# Patient Record
Sex: Male | Born: 1935
Health system: Southern US, Community
[De-identification: ages and names within clinical notes are randomized; demographics above are authoritative.]

## PROBLEM LIST (undated history)

## (undated) DIAGNOSIS — E785 Hyperlipidemia, unspecified: Secondary | ICD-10-CM

## (undated) DIAGNOSIS — D500 Iron deficiency anemia secondary to blood loss (chronic): Secondary | ICD-10-CM

## (undated) DIAGNOSIS — Z9889 Other specified postprocedural states: Secondary | ICD-10-CM

## (undated) DIAGNOSIS — H7290 Unspecified perforation of tympanic membrane, unspecified ear: Secondary | ICD-10-CM

## (undated) DIAGNOSIS — K922 Gastrointestinal hemorrhage, unspecified: Secondary | ICD-10-CM

## (undated) DIAGNOSIS — C61 Malignant neoplasm of prostate: Secondary | ICD-10-CM

## (undated) DIAGNOSIS — I480 Paroxysmal atrial fibrillation: Secondary | ICD-10-CM

## (undated) DIAGNOSIS — K209 Esophagitis, unspecified without bleeding: Secondary | ICD-10-CM

## (undated) DIAGNOSIS — I350 Nonrheumatic aortic (valve) stenosis: Secondary | ICD-10-CM

## (undated) DIAGNOSIS — I739 Peripheral vascular disease, unspecified: Secondary | ICD-10-CM

## (undated) DIAGNOSIS — G5600 Carpal tunnel syndrome, unspecified upper limb: Secondary | ICD-10-CM

## (undated) DIAGNOSIS — M353 Polymyalgia rheumatica: Secondary | ICD-10-CM

## (undated) DIAGNOSIS — K2210 Ulcer of esophagus without bleeding: Secondary | ICD-10-CM

## (undated) DIAGNOSIS — I2510 Atherosclerotic heart disease of native coronary artery without angina pectoris: Secondary | ICD-10-CM

## (undated) DIAGNOSIS — K219 Gastro-esophageal reflux disease without esophagitis: Secondary | ICD-10-CM

## (undated) DIAGNOSIS — E559 Vitamin D deficiency, unspecified: Secondary | ICD-10-CM

## (undated) DIAGNOSIS — I10 Essential (primary) hypertension: Secondary | ICD-10-CM

## (undated) HISTORY — DX: Essential (primary) hypertension: I10

## (undated) HISTORY — DX: Malignant neoplasm of prostate: C61

## (undated) HISTORY — DX: Gastro-esophageal reflux disease without esophagitis: K21.9

## (undated) HISTORY — DX: Ulcer of esophagus without bleeding: K22.10

## (undated) HISTORY — DX: Peripheral vascular disease, unspecified: I73.9

## (undated) HISTORY — DX: Vitamin D deficiency, unspecified: E55.9

## (undated) HISTORY — DX: Other specified postprocedural states: Z98.890

## (undated) HISTORY — DX: Polymyalgia rheumatica: M35.3

## (undated) HISTORY — DX: Iron deficiency anemia secondary to blood loss (chronic): D50.0

## (undated) HISTORY — PX: PROSTATECTOMY: SHX69

## (undated) HISTORY — DX: Hyperlipidemia, unspecified: E78.5

## (undated) HISTORY — DX: Unspecified perforation of tympanic membrane, unspecified ear: H72.90

## (undated) HISTORY — DX: Esophagitis, unspecified without bleeding: K20.90

## (undated) HISTORY — DX: Carpal tunnel syndrome, unspecified upper limb: G56.00

## (undated) HISTORY — DX: Esophagitis, unspecified: K20.9

---

## 2005-12-08 ENCOUNTER — Ambulatory Visit: Payer: Self-pay | Admitting: Oncology

## 2006-03-03 DIAGNOSIS — Z9889 Other specified postprocedural states: Secondary | ICD-10-CM

## 2006-03-03 HISTORY — DX: Other specified postprocedural states: Z98.890

## 2007-12-28 ENCOUNTER — Ambulatory Visit (HOSPITAL_BASED_OUTPATIENT_CLINIC_OR_DEPARTMENT_OTHER): Admission: RE | Admit: 2007-12-28 | Discharge: 2007-12-28 | Payer: Self-pay | Admitting: Orthopedic Surgery

## 2008-03-10 ENCOUNTER — Ambulatory Visit: Admission: RE | Admit: 2008-03-10 | Discharge: 2008-06-08 | Payer: Self-pay | Admitting: Radiation Oncology

## 2008-03-24 ENCOUNTER — Ambulatory Visit (HOSPITAL_BASED_OUTPATIENT_CLINIC_OR_DEPARTMENT_OTHER): Admission: RE | Admit: 2008-03-24 | Discharge: 2008-03-24 | Payer: Self-pay | Admitting: Orthopedic Surgery

## 2009-03-03 HISTORY — PX: CARDIAC CATHETERIZATION: SHX172

## 2009-04-30 ENCOUNTER — Inpatient Hospital Stay (HOSPITAL_COMMUNITY): Admission: RE | Admit: 2009-04-30 | Discharge: 2009-05-01 | Payer: Self-pay | Admitting: Interventional Cardiology

## 2009-05-17 ENCOUNTER — Inpatient Hospital Stay (HOSPITAL_COMMUNITY): Admission: EM | Admit: 2009-05-17 | Discharge: 2009-05-21 | Payer: Self-pay | Admitting: Emergency Medicine

## 2010-05-22 LAB — GLUCOSE, CAPILLARY
Glucose-Capillary: 115 mg/dL — ABNORMAL HIGH (ref 70–99)
Glucose-Capillary: 87 mg/dL (ref 70–99)

## 2010-05-26 LAB — BASIC METABOLIC PANEL
BUN: 10 mg/dL (ref 6–23)
BUN: 11 mg/dL (ref 6–23)
BUN: 4 mg/dL — ABNORMAL LOW (ref 6–23)
CO2: 25 mEq/L (ref 19–32)
CO2: 26 mEq/L (ref 19–32)
CO2: 27 mEq/L (ref 19–32)
Calcium: 7.9 mg/dL — ABNORMAL LOW (ref 8.4–10.5)
Calcium: 8.5 mg/dL (ref 8.4–10.5)
Calcium: 8.8 mg/dL (ref 8.4–10.5)
Chloride: 104 mEq/L (ref 96–112)
Chloride: 105 mEq/L (ref 96–112)
Chloride: 108 mEq/L (ref 96–112)
Creatinine, Ser: 0.7 mg/dL (ref 0.4–1.5)
Creatinine, Ser: 0.75 mg/dL (ref 0.4–1.5)
Creatinine, Ser: 0.76 mg/dL (ref 0.4–1.5)
GFR calc Af Amer: >60 mL/min (ref >60)
GFR calc Af Amer: >60 mL/min (ref >60)
GFR calc Af Amer: >60 mL/min (ref >60)
GFR calc non Af Amer: >60 mL/min (ref >60)
GFR calc non Af Amer: >60 mL/min (ref >60)
GFR calc non Af Amer: >60 mL/min (ref >60)
Glucose, Bld: 100 mg/dL — ABNORMAL HIGH (ref 70–99)
Glucose, Bld: 102 mg/dL — ABNORMAL HIGH (ref 70–99)
Glucose, Bld: 96 mg/dL (ref 70–99)
Potassium: 3.3 mEq/L — ABNORMAL LOW (ref 3.5–5.1)
Potassium: 3.5 mEq/L (ref 3.5–5.1)
Potassium: 3.8 mEq/L (ref 3.5–5.1)
Sodium: 136 mEq/L (ref 135–145)
Sodium: 137 mEq/L (ref 135–145)
Sodium: 139 mEq/L (ref 135–145)

## 2010-05-26 LAB — COMPREHENSIVE METABOLIC PANEL
ALT: 16 U/L (ref 0–53)
ALT: 16 U/L (ref 0–53)
AST: 17 U/L (ref 0–37)
AST: 19 U/L (ref 0–37)
Albumin: 3.2 g/dL — ABNORMAL LOW (ref 3.5–5.2)
Albumin: 3.7 g/dL (ref 3.5–5.2)
Alkaline Phosphatase: 25 U/L — ABNORMAL LOW (ref 39–117)
Alkaline Phosphatase: 29 U/L — ABNORMAL LOW (ref 39–117)
BUN: 20 mg/dL (ref 6–23)
BUN: 37 mg/dL — ABNORMAL HIGH (ref 6–23)
CO2: 24 mEq/L (ref 19–32)
CO2: 25 mEq/L (ref 19–32)
Calcium: 8.5 mg/dL (ref 8.4–10.5)
Calcium: 8.8 mg/dL (ref 8.4–10.5)
Chloride: 106 mEq/L (ref 96–112)
Chloride: 111 mEq/L (ref 96–112)
Creatinine, Ser: 0.72 mg/dL (ref 0.4–1.5)
Creatinine, Ser: 0.83 mg/dL (ref 0.4–1.5)
GFR calc Af Amer: >60 mL/min (ref >60)
GFR calc Af Amer: >60 mL/min (ref >60)
GFR calc non Af Amer: >60 mL/min (ref >60)
GFR calc non Af Amer: >60 mL/min (ref >60)
Glucose, Bld: 111 mg/dL — ABNORMAL HIGH (ref 70–99)
Glucose, Bld: 136 mg/dL — ABNORMAL HIGH (ref 70–99)
Potassium: 3.7 mEq/L (ref 3.5–5.1)
Potassium: 4 mEq/L (ref 3.5–5.1)
Sodium: 138 mEq/L (ref 135–145)
Sodium: 140 mEq/L (ref 135–145)
Total Bilirubin: 0.6 mg/dL (ref 0.3–1.2)
Total Bilirubin: 0.7 mg/dL (ref 0.3–1.2)
Total Protein: 5.9 g/dL — ABNORMAL LOW (ref 6.0–8.3)
Total Protein: 6.5 g/dL (ref 6.0–8.3)

## 2010-05-26 LAB — DIFFERENTIAL
Basophils Absolute: 0 10*3/uL (ref 0.0–0.1)
Basophils Absolute: 0 10*3/uL (ref 0.0–0.1)
Basophils Relative: 0 % (ref 0–1)
Basophils Relative: 0 % (ref 0–1)
Eosinophils Absolute: 0 10*3/uL (ref 0.0–0.7)
Eosinophils Absolute: 0.1 10*3/uL (ref 0.0–0.7)
Eosinophils Relative: 0 % (ref 0–5)
Eosinophils Relative: 1 % (ref 0–5)
Lymphocytes Relative: 11 % — ABNORMAL LOW (ref 12–46)
Lymphocytes Relative: 9 % — ABNORMAL LOW (ref 12–46)
Lymphs Abs: 0.4 10*3/uL — ABNORMAL LOW (ref 0.7–4.0)
Lymphs Abs: 0.9 10*3/uL (ref 0.7–4.0)
Monocytes Absolute: 0.1 10*3/uL (ref 0.1–1.0)
Monocytes Absolute: 0.6 10*3/uL (ref 0.1–1.0)
Monocytes Relative: 3 % (ref 3–12)
Monocytes Relative: 7 % (ref 3–12)
Neutro Abs: 4.4 10*3/uL (ref 1.7–7.7)
Neutro Abs: 7.1 10*3/uL (ref 1.7–7.7)
Neutrophils Relative %: 81 % — ABNORMAL HIGH (ref 43–77)
Neutrophils Relative %: 88 % — ABNORMAL HIGH (ref 43–77)

## 2010-05-26 LAB — BASIC METABOLIC PANEL WITH GFR
BUN: 6 mg/dL (ref 6–23)
CO2: 26 meq/L (ref 19–32)
Calcium: 8.4 mg/dL (ref 8.4–10.5)
Chloride: 109 meq/L (ref 96–112)
Creatinine, Ser: 0.65 mg/dL (ref 0.4–1.5)
GFR calc non Af Amer: >60 mL/min
Glucose, Bld: 95 mg/dL (ref 70–99)
Potassium: 3.4 meq/L — ABNORMAL LOW (ref 3.5–5.1)
Sodium: 143 meq/L (ref 135–145)

## 2010-05-26 LAB — TYPE AND SCREEN
ABO/RH(D): O NEG
Antibody Screen: NEGATIVE

## 2010-05-26 LAB — MRSA PCR SCREENING: MRSA by PCR: NEGATIVE

## 2010-05-26 LAB — CBC
HCT: 23 % — ABNORMAL LOW (ref 39.0–52.0)
HCT: 23.5 % — ABNORMAL LOW (ref 39.0–52.0)
HCT: 24 % — ABNORMAL LOW (ref 39.0–52.0)
HCT: 25.8 % — ABNORMAL LOW (ref 39.0–52.0)
HCT: 25.8 % — ABNORMAL LOW (ref 39.0–52.0)
HCT: 32.8 % — ABNORMAL LOW (ref 39.0–52.0)
HCT: 35.3 % — ABNORMAL LOW (ref 39.0–52.0)
Hemoglobin: 11.5 g/dL — ABNORMAL LOW (ref 13.0–17.0)
Hemoglobin: 12.5 g/dL — ABNORMAL LOW (ref 13.0–17.0)
Hemoglobin: 8.1 g/dL — ABNORMAL LOW (ref 13.0–17.0)
Hemoglobin: 8.2 g/dL — ABNORMAL LOW (ref 13.0–17.0)
Hemoglobin: 8.3 g/dL — ABNORMAL LOW (ref 13.0–17.0)
Hemoglobin: 8.9 g/dL — ABNORMAL LOW (ref 13.0–17.0)
Hemoglobin: 9 g/dL — ABNORMAL LOW (ref 13.0–17.0)
MCHC: 34.3 g/dL (ref 30.0–36.0)
MCHC: 34.6 g/dL (ref 30.0–36.0)
MCHC: 34.9 g/dL (ref 30.0–36.0)
MCHC: 35.1 g/dL (ref 30.0–36.0)
MCHC: 35.1 g/dL (ref 30.0–36.0)
MCHC: 35.1 g/dL (ref 30.0–36.0)
MCHC: 35.3 g/dL (ref 30.0–36.0)
MCV: 100 fL (ref 78.0–100.0)
MCV: 98.3 fL (ref 78.0–100.0)
MCV: 98.7 fL (ref 78.0–100.0)
MCV: 98.9 fL (ref 78.0–100.0)
MCV: 99.2 fL (ref 78.0–100.0)
MCV: 99.5 fL (ref 78.0–100.0)
MCV: 99.6 fL (ref 78.0–100.0)
Platelets: 130 10*3/uL — ABNORMAL LOW (ref 150–400)
Platelets: 136 10*3/uL — ABNORMAL LOW (ref 150–400)
Platelets: 136 K/uL — ABNORMAL LOW (ref 150–400)
Platelets: 151 10*3/uL (ref 150–400)
Platelets: 151 K/uL (ref 150–400)
Platelets: 154 10*3/uL (ref 150–400)
Platelets: 202 10*3/uL (ref 150–400)
RBC: 2.32 MIL/uL — ABNORMAL LOW (ref 4.22–5.81)
RBC: 2.36 MIL/uL — ABNORMAL LOW (ref 4.22–5.81)
RBC: 2.41 MIL/uL — ABNORMAL LOW (ref 4.22–5.81)
RBC: 2.58 MIL/uL — ABNORMAL LOW (ref 4.22–5.81)
RBC: 2.61 MIL/uL — ABNORMAL LOW (ref 4.22–5.81)
RBC: 3.31 MIL/uL — ABNORMAL LOW (ref 4.22–5.81)
RBC: 3.59 MIL/uL — ABNORMAL LOW (ref 4.22–5.81)
RDW: 13.5 % (ref 11.5–15.5)
RDW: 13.5 % (ref 11.5–15.5)
RDW: 13.6 % (ref 11.5–15.5)
RDW: 13.7 % (ref 11.5–15.5)
RDW: 13.7 % (ref 11.5–15.5)
RDW: 13.9 % (ref 11.5–15.5)
RDW: 13.9 % (ref 11.5–15.5)
WBC: 4 K/uL (ref 4.0–10.5)
WBC: 4.1 10*3/uL (ref 4.0–10.5)
WBC: 4.9 10*3/uL (ref 4.0–10.5)
WBC: 5 10*3/uL (ref 4.0–10.5)
WBC: 5.3 K/uL (ref 4.0–10.5)
WBC: 5.5 10*3/uL (ref 4.0–10.5)
WBC: 8.7 10*3/uL (ref 4.0–10.5)

## 2010-05-26 LAB — HEMOGLOBIN AND HEMATOCRIT, BLOOD
HCT: 23.4 % — ABNORMAL LOW (ref 39.0–52.0)
HCT: 25.1 % — ABNORMAL LOW (ref 39.0–52.0)
HCT: 26.5 % — ABNORMAL LOW (ref 39.0–52.0)
HCT: 27 % — ABNORMAL LOW (ref 39.0–52.0)
HCT: 27.3 % — ABNORMAL LOW (ref 39.0–52.0)
HCT: 27.9 % — ABNORMAL LOW (ref 39.0–52.0)
Hemoglobin: 8.1 g/dL — ABNORMAL LOW (ref 13.0–17.0)
Hemoglobin: 8.7 g/dL — ABNORMAL LOW (ref 13.0–17.0)
Hemoglobin: 9.1 g/dL — ABNORMAL LOW (ref 13.0–17.0)
Hemoglobin: 9.4 g/dL — ABNORMAL LOW (ref 13.0–17.0)
Hemoglobin: 9.5 g/dL — ABNORMAL LOW (ref 13.0–17.0)
Hemoglobin: 9.7 g/dL — ABNORMAL LOW (ref 13.0–17.0)

## 2010-05-26 LAB — GLUCOSE, CAPILLARY: Glucose-Capillary: 136 mg/dL — ABNORMAL HIGH (ref 70–99)

## 2010-05-26 LAB — PROTIME-INR
INR: 1.11 (ref 0.00–1.49)
Prothrombin Time: 14.2 seconds (ref 11.6–15.2)

## 2010-05-26 LAB — HEMOCCULT GUIAC POC 1CARD (OFFICE): Fecal Occult Bld: POSITIVE

## 2010-05-26 LAB — H. PYLORI ANTIBODY, IGG: H Pylori IgG: <0.4 {ISR}

## 2010-05-26 LAB — ABO/RH: ABO/RH(D): O NEG

## 2010-05-26 LAB — APTT: aPTT: 33 seconds (ref 24–37)

## 2010-06-17 LAB — POCT I-STAT, CHEM 8
BUN: 21 mg/dL (ref 6–23)
Calcium, Ion: 1.12 mmol/L (ref 1.12–1.32)
Chloride: 104 mEq/L (ref 96–112)
Creatinine, Ser: 0.7 mg/dL (ref 0.4–1.5)
Glucose, Bld: 96 mg/dL (ref 70–99)
HCT: 44 % (ref 39.0–52.0)
Hemoglobin: 15 g/dL (ref 13.0–17.0)
Potassium: 3.9 mEq/L (ref 3.5–5.1)
Sodium: 138 mEq/L (ref 135–145)
TCO2: 27 mmol/L (ref 0–100)

## 2010-07-16 NOTE — Op Note (Signed)
NAME:  Robert [name], Robert [name]          ACCOUNT NO.:  [id number]   MEDICAL RECORD NO.:  [id number]          PATIENT TYPE:  AMB   LOCATION:  DSC                          FACILITY:  [facility]   PHYSICIAN:  Katy Fitch. Sypher, M.D. DATE OF BIRTH:  29-Mar-1927   DATE OF PROCEDURE:  03/24/2008  DATE OF DISCHARGE:                               OPERATIVE REPORT   PREOPERATIVE DIAGNOSIS:  Entrapment neuropathy, left median nerve at  carpal tunnel.   POSTOPERATIVE DIAGNOSIS:  Entrapment neuropathy, left median nerve at  carpal tunnel.   OPERATIONS:  Release of left transcarpal ligament.   OPERATIONS:  Katy Fitch. Sypher, MD   ASSISTANT:  Marveen Reeks Dasnoit, PA-C   ANESTHESIA:  General by LMA.   SUPERVISING ANESTHESIOLOGIST:  Sheldon Silvan, MD   INDICATIONS:  Robert [name] is a 77 year old gentleman referred  through the courtesy of Dr. Johnella Moloney for evaluation and management  of a numb and painful left hand.  He has background history of  polymyalgia rheumatica.  This has responded to steroid therapy under the  supervision of Dr. Kevan Ny.  He is status post successful right carpal  tunnel release surgery and now presents for similar surgery on the  right.   After informed consent, he is brought to the operating room at this  time.   PROCEDURE:  [name] was brought to the operating room and  placed in the supine position upon the operating table.   Following induction of general anesthesia by LMA technique, the left arm  was prepped with Betadine soap and solution, sterilely draped.  A  pneumatic tourniquet was applied to the proximal left brachium.   Following exsanguination of left arm with an Esmarch bandage, arterial  tourniquet was inflated to 230 mmHg.  The procedure commenced with a  short incision in the line of the ring finger in the palm.  Subcutaneous  tissues were carefully divided revealing the palmar fascia.  This split  longitudinally to the common sensory branch  of the median nerve.  These  were followed back to the transcarpal ligament, which was gently  isolated from median nerve.  Ligament was then released along its ulnar  border extending into the distal forearm.  This widely opened the carpal  canal.  No mass or other predicaments were noted.  Bleeding points along  the ligament were not problematic.  The wound was then repaired with  dermal 3-0 Prolene suture.  A compressive dressing was applied with  Xeroflo sterile gauze and a volar plaster splint maintaining the wrist  in 5 degrees of dorsiflexion.   There were no apparent complications.    Note, for aftercare, [name] is provided a prescription for  Vicodin 5 mg 1 p.o. q.4-6 h. p.r.n. pain, 20 tablets without refill.  He  will return to see me for followup in the office in 1 week.      Katy Fitch Sypher, M.D.  Electronically Signed     RVS/MEDQ  D:  03/24/2008  T:  03/24/2008  Job:  [id number]   cc:   Candyce Churn, M.D.

## 2010-07-16 NOTE — Op Note (Signed)
NAME:  Robert [name], Robert [name]          ACCOUNT NO.:  [id number]   MEDICAL RECORD NO.:  [id number]          PATIENT TYPE:  AMB   LOCATION:  DSC                          FACILITY:  [facility]   PHYSICIAN:  Katy Fitch. Sypher, M.D. DATE OF BIRTH:  Dec 23, 1927   DATE OF PROCEDURE:  12/28/2007  DATE OF DISCHARGE:                               OPERATIVE REPORT   PREOPERATIVE DIAGNOSES:  1. Two-week history of profound right wrist swelling, both dorsal and      volar with acute on chronic severe carpal tunnel syndrome and      dystrophic-type pain response.  2. Rule out atypical mycobacteria infection of tenosynovium of flexor      tendons, rule out chronic atypical infection, rule out gout or      pseudogout.   POSTOPERATIVE DIAGNOSES:  1. 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  2. Severe chronic carpal tunnel syndrome with significant compression      of median nerve and unrecordable median motor latencies across the      right wrist.  3. Chronic synovitis, right wrist with a sero-purulent effusion that      was drained and a partial synovectomy performed for aerobic      cultures, anaerobic cultures, AFB, and fungus cultures with drains      left in the dorsal radiocarpal articulation.   OPERATING SURGEON:  Katy Fitch. Sypher, MD   ASSISTANT:  Marveen Reeks Dasnoit, PA-C   ANESTHESIA:  General by LMA.   SUPERVISING ANESTHESIOLOGIST:  Burna Forts, MD   INDICATIONS:  Robert [name] is a 77 year old gentleman referred  through the courtesy of Robert [name] of [facility].  Beginning in the first week of October, Robert [name] had experience  increasing pain in the right hand.  He had a history of chronic episodic  numbness of his median innervated fingers on the right, which he had  tolerated for more than 18 months.  He had symptoms when he drove, read,  or played his fiddle.  He  subsequently presented for evaluation by Dr.  Kevan [name] on December 16, 2007 and was noted to have signs of chronic  synovitis, probable chronic carpal tunnel syndrome, and triggering of  some fingers.  He had been on a hydrochlorothiazide containing  antihypertensive medication.  Robert [name] sent him for screening lab  studies including a uric acid that was noted to be 3.9 and other routine  screening that was essentially nondiagnostic.  Robert [name] tried a steroid  injection.   Robert [name] returned on December 27, 2007 with increased pain,  increased numbness, obvious wrist swelling, and was provided Toradol as  a parenteral analgesic.  Robert [name] contacted me for an acute upper  extremity orthopedic consult.  Robert [name] had tried indomethacin 50 mg  p.o. b.i.d. on December 17, 2007 in an effort to control gout or  pseudogout without success.   Robert [name] was seen in consult in our office and was noted to have a  very warm wrist on palpation, both dorsal and on the volar  aspect of the  wrist and ulnar bursa.  He had 4+ swelling of his ulnar bursa and  profound carpal tunnel syndrome.  He had less than 50% normal wrist  motion due to pain.  His pulse and capillary refill were well-preserved.  He had intact sensibility in the radial and ulnar distribution.  Robert [name] proceeded with electrodiagnostic studies which confirmed  profound right carpal tunnel syndrome and a mild-to-moderate left carpal  tunnel syndrome.   Due to the fact that Robert [name] had a crescendo of pain, obvious  synovitis, no sign of gout with respect to serum uric acid, and an  elevated sed rate and white count, we recommended proceeding with  synovectomy for culture and decompression of the median nerve with the  carpal tunnel as well as synovectomy of the wrist joint with culture of  the radiocarpal and midcarpal joints.  After informed consent, he is  brought to the operating room at this time.   PROCEDURE IN  DETAIL:  Robert [name] is brought to the operating  room and placed in supine position upon the operating table.  Following  an anesthesia consult with Robert [name], general anesthesia by LMA was  recommended and accepted.   Robert [name] is brought to room 6, placed in supine position upon the  operating table, and under Robert [name] direct supervision general  anesthesia by LMA technique induced.   The right arm was prepped with Betadine soap and solution and sterilely  draped.  A pneumatic tourniquet was applied to the proximal right  brachium.  No IV antibiotics were provided.  The arm was elevated for 1  minute and the arterial tourniquet inflated to 250 mmHg.  The procedure  commenced with an extended carpal tunnel incision paralleling the thenar  crease.  Subcutaneous tissues were carefully divided revealing a very  edematous palmar fascia.  This was carefully split in line of its fibers  revealing the ulnar bursa.  The common sensory branch of the median  nerve and the transverse carpal ligament.  The median nerve was  identified in a very superficial and ulnar position due to marked  swelling of the tenosynovium.  With great care, the carpal canal was  sounded with a Penfield 4 elevator and a scalpel and tenotomy scissors  were used to release the ulnar aspect of the transverse carpal ligament  directly off the hook of the hamate, taking care to avoid any atypical  motor branches of the median nerve.  The tenosynovium of the flexor  tendons and the ulnar bursa was gray, hyperemic, and quite wet with thin  synovial fluid.  A generous portion of this swollen tenosynovium was  harvested with scissors and rongeur dissection for sampling for aerobic  and anaerobic growth, AFB, and fungal culture followed by sampling in  absolute alcohol for crystal examination.  I could not grossly identify  any gouty or pseudogout-type crystals in the synovium.  The floor of the  wrist was  palpated and found to be swollen.  Therefore, we proceeded to  release the volar forearm fascia to prevent secondary compression of the  median nerve in the distal forearm and subsequently irrigated the carpal  canal followed by hemostasis.  The palmar wound was closed with an  intradermal 3-0 Prolene loosely.  Attention was then directed to the  dorsal aspect of the wrist.  A 22-gauge needle was placed through the  arthroscopic 3-4 dorsal portal and approximately 1/2 mL of slightly  turbid yellowish  fluid was aspirated.  This had a less than normal  string sign, suggesting an inflammatory effusion.  A formal dorsal  arthrotomy was then accomplished with a transverse 3 cm incision.  The  extensor tendons were retracted distal to the extensor retinaculum and  an arthrotomy was created directly over the scapholunate ligament and  radiocarpal space.  There were loculations within the wrist joint.  These were broken off with recovery of inflammatory synovial fluid.  There was no sign of frank pus.  Generous samples of synovium,  anaerobic, and aerobic culture swabs were obtained.  The synovium will  be sent for AFB and fungal culture.   After thorough lavage of the wrist, a vessel loop drain was placed into  the ulnar aspect of the radiocarpal articulation.  The midcarpal joint  was gently palpated with a fine hemostat and found also to have an  effusion.   The dorsal wound was loosely closed with an intradermal 3-0 Prolene  suture that was not tensioned.  Steri-Strips were used to secure the  drain and the intradermal 3-0 Prolene suture.  A voluminous hand  dressing was applied with a volar plaster splint.   Presumptive diagnosis is chronic atypical synovitis.  This could be  infectious or due to a crystal arthropathy.  We will have to wait until  we have adequate results from the lab.   After the cultures were obtained, Mr. Beagle was given 1 gram of  Ancef as an IV prophylactic  antibiotic.  He will be discharged on  colchicine, Motrin, and Percocet.  We will also have him continue with a  prescription of Keflex that was provided at his outpatient medical  clinic.      Katy Fitch Sypher, M.D.  Electronically Signed     RVS/MEDQ  D:  12/28/2007  T:  12/29/2007  Job:  [id number]   cc:   Candyce Churn, M.D.

## 2010-12-03 LAB — FUNGUS CULTURE W SMEAR: Fungal Smear: NONE SEEN

## 2010-12-03 LAB — TISSUE CULTURE
Culture: NO GROWTH
Gram Stain: NONE SEEN

## 2010-12-03 LAB — ANAEROBIC CULTURE: Gram Stain: NONE SEEN

## 2010-12-03 LAB — POCT HEMOGLOBIN-HEMACUE: Hemoglobin: 13.5 g/dL (ref 13.0–17.0)

## 2010-12-03 LAB — BASIC METABOLIC PANEL
BUN: 21 mg/dL (ref 6–23)
CO2: 25 mEq/L (ref 19–32)
Calcium: 9.1 mg/dL (ref 8.4–10.5)
Chloride: 101 mEq/L (ref 96–112)
Creatinine, Ser: 1.01 mg/dL (ref 0.4–1.5)
GFR calc Af Amer: >60 mL/min (ref >60)
GFR calc non Af Amer: >60 mL/min (ref >60)
Glucose, Bld: 113 mg/dL — ABNORMAL HIGH (ref 70–99)
Potassium: 4.1 mEq/L (ref 3.5–5.1)
Sodium: 135 mEq/L (ref 135–145)

## 2010-12-03 LAB — WOUND CULTURE
Culture: NO GROWTH
Culture: NO GROWTH
Gram Stain: NONE SEEN

## 2010-12-03 LAB — AFB CULTURE WITH SMEAR (NOT AT ARMC): Acid Fast Smear: NONE SEEN

## 2010-12-03 LAB — SYNOVIAL FLUID, CRYSTAL: Crystals, Fluid: NONE SEEN

## 2011-05-02 DIAGNOSIS — L2089 Other atopic dermatitis: Secondary | ICD-10-CM | POA: Diagnosis not present

## 2011-05-02 DIAGNOSIS — H60399 Other infective otitis externa, unspecified ear: Secondary | ICD-10-CM | POA: Diagnosis not present

## 2011-06-23 DIAGNOSIS — L259 Unspecified contact dermatitis, unspecified cause: Secondary | ICD-10-CM | POA: Diagnosis not present

## 2011-06-23 DIAGNOSIS — Z85828 Personal history of other malignant neoplasm of skin: Secondary | ICD-10-CM | POA: Diagnosis not present

## 2011-06-23 DIAGNOSIS — C44519 Basal cell carcinoma of skin of other part of trunk: Secondary | ICD-10-CM | POA: Diagnosis not present

## 2011-06-23 DIAGNOSIS — L570 Actinic keratosis: Secondary | ICD-10-CM | POA: Diagnosis not present

## 2011-06-23 DIAGNOSIS — D2370 Other benign neoplasm of skin of unspecified lower limb, including hip: Secondary | ICD-10-CM | POA: Diagnosis not present

## 2011-06-23 DIAGNOSIS — D485 Neoplasm of uncertain behavior of skin: Secondary | ICD-10-CM | POA: Diagnosis not present

## 2011-06-23 DIAGNOSIS — C44621 Squamous cell carcinoma of skin of unspecified upper limb, including shoulder: Secondary | ICD-10-CM | POA: Diagnosis not present

## 2011-08-21 DIAGNOSIS — N3000 Acute cystitis without hematuria: Secondary | ICD-10-CM | POA: Diagnosis not present

## 2011-08-21 DIAGNOSIS — N390 Urinary tract infection, site not specified: Secondary | ICD-10-CM | POA: Diagnosis not present

## 2011-10-17 DIAGNOSIS — I10 Essential (primary) hypertension: Secondary | ICD-10-CM | POA: Diagnosis not present

## 2011-10-17 DIAGNOSIS — E785 Hyperlipidemia, unspecified: Secondary | ICD-10-CM | POA: Diagnosis not present

## 2011-10-17 DIAGNOSIS — K2100 Gastro-esophageal reflux disease with esophagitis, without bleeding: Secondary | ICD-10-CM | POA: Diagnosis not present

## 2011-10-17 DIAGNOSIS — I2510 Atherosclerotic heart disease of native coronary artery without angina pectoris: Secondary | ICD-10-CM | POA: Diagnosis not present

## 2011-10-20 DIAGNOSIS — M79609 Pain in unspecified limb: Secondary | ICD-10-CM | POA: Diagnosis not present

## 2011-10-20 DIAGNOSIS — M353 Polymyalgia rheumatica: Secondary | ICD-10-CM | POA: Diagnosis not present

## 2011-10-20 DIAGNOSIS — K2100 Gastro-esophageal reflux disease with esophagitis, without bleeding: Secondary | ICD-10-CM | POA: Diagnosis not present

## 2011-10-20 DIAGNOSIS — I10 Essential (primary) hypertension: Secondary | ICD-10-CM | POA: Diagnosis not present

## 2011-10-20 DIAGNOSIS — J069 Acute upper respiratory infection, unspecified: Secondary | ICD-10-CM | POA: Diagnosis not present

## 2011-10-20 DIAGNOSIS — E559 Vitamin D deficiency, unspecified: Secondary | ICD-10-CM | POA: Diagnosis not present

## 2011-10-24 DIAGNOSIS — J209 Acute bronchitis, unspecified: Secondary | ICD-10-CM | POA: Diagnosis not present

## 2011-11-20 DIAGNOSIS — K2100 Gastro-esophageal reflux disease with esophagitis, without bleeding: Secondary | ICD-10-CM | POA: Diagnosis not present

## 2011-11-20 DIAGNOSIS — M79609 Pain in unspecified limb: Secondary | ICD-10-CM | POA: Diagnosis not present

## 2011-11-20 DIAGNOSIS — M353 Polymyalgia rheumatica: Secondary | ICD-10-CM | POA: Diagnosis not present

## 2011-11-20 DIAGNOSIS — I10 Essential (primary) hypertension: Secondary | ICD-10-CM | POA: Diagnosis not present

## 2011-11-20 DIAGNOSIS — R490 Dysphonia: Secondary | ICD-10-CM | POA: Diagnosis not present

## 2011-11-24 DIAGNOSIS — H7290 Unspecified perforation of tympanic membrane, unspecified ear: Secondary | ICD-10-CM | POA: Diagnosis not present

## 2011-11-24 DIAGNOSIS — H6980 Other specified disorders of Eustachian tube, unspecified ear: Secondary | ICD-10-CM | POA: Diagnosis not present

## 2011-11-24 DIAGNOSIS — J040 Acute laryngitis: Secondary | ICD-10-CM | POA: Diagnosis not present

## 2011-12-16 DIAGNOSIS — H524 Presbyopia: Secondary | ICD-10-CM | POA: Diagnosis not present

## 2011-12-16 DIAGNOSIS — Z961 Presence of intraocular lens: Secondary | ICD-10-CM | POA: Diagnosis not present

## 2012-02-05 DIAGNOSIS — H9210 Otorrhea, unspecified ear: Secondary | ICD-10-CM | POA: Diagnosis not present

## 2012-02-05 DIAGNOSIS — H7290 Unspecified perforation of tympanic membrane, unspecified ear: Secondary | ICD-10-CM | POA: Diagnosis not present

## 2012-02-10 DIAGNOSIS — E559 Vitamin D deficiency, unspecified: Secondary | ICD-10-CM | POA: Diagnosis not present

## 2012-02-10 DIAGNOSIS — L2089 Other atopic dermatitis: Secondary | ICD-10-CM | POA: Diagnosis not present

## 2012-02-10 DIAGNOSIS — Z23 Encounter for immunization: Secondary | ICD-10-CM | POA: Diagnosis not present

## 2012-02-10 DIAGNOSIS — Z1331 Encounter for screening for depression: Secondary | ICD-10-CM | POA: Diagnosis not present

## 2012-02-10 DIAGNOSIS — Z0000 Encounter for general adult medical examination without abnormal findings: Secondary | ICD-10-CM | POA: Diagnosis not present

## 2012-02-10 DIAGNOSIS — C61 Malignant neoplasm of prostate: Secondary | ICD-10-CM | POA: Diagnosis not present

## 2012-02-10 DIAGNOSIS — I10 Essential (primary) hypertension: Secondary | ICD-10-CM | POA: Diagnosis not present

## 2012-02-10 DIAGNOSIS — Z79899 Other long term (current) drug therapy: Secondary | ICD-10-CM | POA: Diagnosis not present

## 2012-02-10 DIAGNOSIS — M353 Polymyalgia rheumatica: Secondary | ICD-10-CM | POA: Diagnosis not present

## 2012-02-10 DIAGNOSIS — K2100 Gastro-esophageal reflux disease with esophagitis, without bleeding: Secondary | ICD-10-CM | POA: Diagnosis not present

## 2012-02-10 DIAGNOSIS — I2510 Atherosclerotic heart disease of native coronary artery without angina pectoris: Secondary | ICD-10-CM | POA: Diagnosis not present

## 2012-04-07 DIAGNOSIS — L820 Inflamed seborrheic keratosis: Secondary | ICD-10-CM | POA: Diagnosis not present

## 2012-04-07 DIAGNOSIS — L570 Actinic keratosis: Secondary | ICD-10-CM | POA: Diagnosis not present

## 2012-04-07 DIAGNOSIS — L821 Other seborrheic keratosis: Secondary | ICD-10-CM | POA: Diagnosis not present

## 2012-04-07 DIAGNOSIS — C4441 Basal cell carcinoma of skin of scalp and neck: Secondary | ICD-10-CM | POA: Diagnosis not present

## 2012-04-07 DIAGNOSIS — D485 Neoplasm of uncertain behavior of skin: Secondary | ICD-10-CM | POA: Diagnosis not present

## 2012-04-07 DIAGNOSIS — Z85828 Personal history of other malignant neoplasm of skin: Secondary | ICD-10-CM | POA: Diagnosis not present

## 2012-05-11 DIAGNOSIS — H00019 Hordeolum externum unspecified eye, unspecified eyelid: Secondary | ICD-10-CM | POA: Diagnosis not present

## 2012-07-01 DIAGNOSIS — M79609 Pain in unspecified limb: Secondary | ICD-10-CM | POA: Diagnosis not present

## 2012-08-12 DIAGNOSIS — M353 Polymyalgia rheumatica: Secondary | ICD-10-CM | POA: Diagnosis not present

## 2012-08-12 DIAGNOSIS — R490 Dysphonia: Secondary | ICD-10-CM | POA: Diagnosis not present

## 2012-08-12 DIAGNOSIS — I10 Essential (primary) hypertension: Secondary | ICD-10-CM | POA: Diagnosis not present

## 2012-08-12 DIAGNOSIS — C61 Malignant neoplasm of prostate: Secondary | ICD-10-CM | POA: Diagnosis not present

## 2012-08-12 DIAGNOSIS — M790 Rheumatism, unspecified: Secondary | ICD-10-CM | POA: Diagnosis not present

## 2012-11-11 DIAGNOSIS — K2100 Gastro-esophageal reflux disease with esophagitis, without bleeding: Secondary | ICD-10-CM | POA: Diagnosis not present

## 2012-11-11 DIAGNOSIS — I10 Essential (primary) hypertension: Secondary | ICD-10-CM | POA: Diagnosis not present

## 2012-11-11 DIAGNOSIS — I4891 Unspecified atrial fibrillation: Secondary | ICD-10-CM | POA: Diagnosis not present

## 2012-11-11 DIAGNOSIS — I2510 Atherosclerotic heart disease of native coronary artery without angina pectoris: Secondary | ICD-10-CM | POA: Diagnosis not present

## 2012-11-11 DIAGNOSIS — E785 Hyperlipidemia, unspecified: Secondary | ICD-10-CM | POA: Diagnosis not present

## 2012-11-19 DIAGNOSIS — R0989 Other specified symptoms and signs involving the circulatory and respiratory systems: Secondary | ICD-10-CM | POA: Diagnosis not present

## 2013-01-01 DIAGNOSIS — IMO0002 Reserved for concepts with insufficient information to code with codable children: Secondary | ICD-10-CM | POA: Diagnosis not present

## 2013-01-01 DIAGNOSIS — Z23 Encounter for immunization: Secondary | ICD-10-CM | POA: Diagnosis not present

## 2013-02-11 DIAGNOSIS — I2510 Atherosclerotic heart disease of native coronary artery without angina pectoris: Secondary | ICD-10-CM | POA: Diagnosis not present

## 2013-02-11 DIAGNOSIS — Z1331 Encounter for screening for depression: Secondary | ICD-10-CM | POA: Diagnosis not present

## 2013-02-11 DIAGNOSIS — H60399 Other infective otitis externa, unspecified ear: Secondary | ICD-10-CM | POA: Diagnosis not present

## 2013-02-11 DIAGNOSIS — M353 Polymyalgia rheumatica: Secondary | ICD-10-CM | POA: Diagnosis not present

## 2013-02-11 DIAGNOSIS — Z0000 Encounter for general adult medical examination without abnormal findings: Secondary | ICD-10-CM | POA: Diagnosis not present

## 2013-02-11 DIAGNOSIS — M79609 Pain in unspecified limb: Secondary | ICD-10-CM | POA: Diagnosis not present

## 2013-02-11 DIAGNOSIS — E559 Vitamin D deficiency, unspecified: Secondary | ICD-10-CM | POA: Diagnosis not present

## 2013-02-11 DIAGNOSIS — E785 Hyperlipidemia, unspecified: Secondary | ICD-10-CM | POA: Diagnosis not present

## 2013-02-11 DIAGNOSIS — I10 Essential (primary) hypertension: Secondary | ICD-10-CM | POA: Diagnosis not present

## 2013-03-01 DIAGNOSIS — J019 Acute sinusitis, unspecified: Secondary | ICD-10-CM | POA: Diagnosis not present

## 2013-03-01 DIAGNOSIS — J029 Acute pharyngitis, unspecified: Secondary | ICD-10-CM | POA: Diagnosis not present

## 2013-03-20 ENCOUNTER — Other Ambulatory Visit: Payer: Self-pay | Admitting: Interventional Cardiology

## 2013-04-01 DIAGNOSIS — J0100 Acute maxillary sinusitis, unspecified: Secondary | ICD-10-CM | POA: Diagnosis not present

## 2013-04-21 ENCOUNTER — Ambulatory Visit (INDEPENDENT_AMBULATORY_CARE_PROVIDER_SITE_OTHER): Payer: Medicare Other | Admitting: Interventional Cardiology

## 2013-04-21 ENCOUNTER — Telehealth: Payer: Self-pay | Admitting: Interventional Cardiology

## 2013-04-21 ENCOUNTER — Encounter: Payer: Self-pay | Admitting: Interventional Cardiology

## 2013-04-21 ENCOUNTER — Encounter: Payer: Self-pay | Admitting: Cardiology

## 2013-04-21 VITALS — BP 155/102 | HR 148 | Ht 69.0 in | Wt 182.0 lb

## 2013-04-21 DIAGNOSIS — I4891 Unspecified atrial fibrillation: Secondary | ICD-10-CM | POA: Diagnosis not present

## 2013-04-21 DIAGNOSIS — I2510 Atherosclerotic heart disease of native coronary artery without angina pectoris: Secondary | ICD-10-CM | POA: Insufficient documentation

## 2013-04-21 DIAGNOSIS — I480 Paroxysmal atrial fibrillation: Secondary | ICD-10-CM | POA: Insufficient documentation

## 2013-04-21 DIAGNOSIS — E785 Hyperlipidemia, unspecified: Secondary | ICD-10-CM | POA: Insufficient documentation

## 2013-04-21 DIAGNOSIS — E782 Mixed hyperlipidemia: Secondary | ICD-10-CM | POA: Insufficient documentation

## 2013-04-21 DIAGNOSIS — I10 Essential (primary) hypertension: Secondary | ICD-10-CM

## 2013-04-21 DIAGNOSIS — K219 Gastro-esophageal reflux disease without esophagitis: Secondary | ICD-10-CM | POA: Diagnosis present

## 2013-04-21 DIAGNOSIS — K2100 Gastro-esophageal reflux disease with esophagitis, without bleeding: Secondary | ICD-10-CM

## 2013-04-21 DIAGNOSIS — I4821 Permanent atrial fibrillation: Secondary | ICD-10-CM | POA: Insufficient documentation

## 2013-04-21 MED ORDER — METOPROLOL SUCCINATE ER 50 MG PO TB24
ORAL_TABLET | ORAL | Status: DC
Start: 2013-04-21 — End: 2013-05-06

## 2013-04-21 MED ORDER — APIXABAN 5 MG PO TABS: 5.0000 mg | ORAL_TABLET | Freq: Two times a day (BID) | ORAL | Status: DC

## 2013-04-21 NOTE — Telephone Encounter (Signed)
Patient states he has been having a recurrence of palpitations without other symptomology (denies SOB, dizziness, weakness) over past week or so. States this has occurred previously and he discussed it with Dr. Irish Lack but that was over three months ago and at that time it was not a current problem. Patient is wanting to get advisement from Dr. Irish Lack on whether he should come in to see "first available" provider or if Dr. Irish Lack can see him within the week to evaluate him. Routed to Dr. Irish Lack.

## 2013-04-21 NOTE — Telephone Encounter (Signed)
Sent message to Dr. Irish Lack to see if he would like for me to work pt in to schedule today.

## 2013-04-21 NOTE — Patient Instructions (Addendum)
Your physician has recommended you make the following change in your medication:   1. Stop Aspirin 81 mg.  2. Start Eliquis 5 mg 1 tablet by mouth twice a day.   3. Increase Metoprolol to 50 mg 1 tablet daily.   Your physician recommends that you return for lab work in 4 weeks on 05/23/13  Your physician recommends that you schedule a follow-up appointment in 4 weeks with Dr. Irish Lack (have lab the same day)  Your physician has requested that you have an echocardiogram. Echocardiography is a painless test that uses sound waves to create images of your heart. It provides your doctor with information about the size and shape of your heart and how well your heart's chambers and valves are working. This procedure takes approximately one hour. There are no restrictions for this procedure.

## 2013-04-21 NOTE — Progress Notes (Signed)
Patient ID: Robert [name], male   DOB: 1927-09-02, 80 y.o.   MRN: [id number] Hyperlipidemia: correction: patient on simvastatin 40 mg daily; not atorvastatin

## 2013-04-21 NOTE — Progress Notes (Signed)
Patient ID: Robert [name], male   DOB: 03-10-1927, 80 y.o.   MRN: [id number]    [address], [address] [place], [place]  [address] Phone: [phone] Fax:  [phone]  Date:  04/21/2013   ID:  Robert [name], DOB 30-Sep-1927, MRN [id number]  PCP:  No primary provider on file.      History of Present Illness: Robert [name] is a 80 y.o. male who had an occluded RCA with collaterals. He had a bare metal stent to the circumflex which was short and large in diameter. Had foot pain and received steroid injection with no relief. Saw ortho and had inserts made. BP at home in the 120/60 range.   He has had palpitations over the past few days continuously.  He had these palpitations once a month in the past.  He has a known h/o PAF.   walks at gym and rides a bicycle, not as frequently due to foot pain. Does light weights. Denies : Chest pain.  Dizziness. .  Leg edema.  Nitroglycerin.  Palpitations.  Syncope.   Mild DOE, not limiting     Wt Readings from Last 3 Encounters:  04/21/13 182 lb (82.555 kg)     Past Medical History  Diagnosis Date  . Hypertension   . Prostate cancer   . Hyperlipidemia   . Carpal tunnel syndrome     bilateral, carpal tunnel release x 2  . Arrhythmia     PAF   . Polymyalgia rheumatica   . GERD (gastroesophageal reflux disease)     EGD, Dr. Toney Rakes 2008  . Esophagitis   . Esophageal ulcer     with hemorrhage, march 2011-elcer injected with epinephrine-Dr. Herbie Baltimore Buccini  . Vitamin D deficiency   . Blood loss anemia March 2011  . Hx of colonoscopy 2008    Dr. Toney Rakes, polyps- 5 yr f/u recommended  . Perforated ear drum     right ear drum, tube in left ear drum, Dr. Constance Holster    Current Outpatient Prescriptions  Medication Sig Dispense Refill  . aspirin 81 MG tablet Take 81 mg by mouth daily.      . cholecalciferol (VITAMIN D) 1000 UNITS tablet Take 1,000 Units by mouth daily.      . metoprolol succinate  (TOPROL-XL) 25 MG 24 hr tablet TAKE 1 TABLET ONCE A DAY  90 tablet  2  . olmesartan-hydrochlorothiazide (BENICAR HCT) 40-25 MG per tablet Take 0.5 tablets by mouth daily.      . Omega-3 Fatty Acids (FISH OIL) 1000 MG CAPS Take 2 capsules by mouth daily.      [name] omeprazole (PRILOSEC) 20 MG capsule Take 20 mg by mouth daily.      . simvastatin (ZOCOR) 40 MG tablet Take 40 mg by mouth daily.       No current facility-administered medications for this visit.    Allergies:    Allergies  Allergen Reactions  . Lisinopril Cough  . Kenalog [Triamcinolone Acetonide] Rash    Social History:  The patient  reports that he has never smoked. He does not have any smokeless tobacco history on file. He reports that he does not drink alcohol or use illicit drugs.   Family History:  The patient's family history includes Breast cancer in his daughter; CVA in his mother; Kidney disease in his father.   ROS:  Please see the history of present illness.  No nausea, vomiting.  No fevers, chills.  No focal weakness.  No  dysuria. palpitations   All other systems reviewed and negative.   PHYSICAL EXAM: VS:  BP 155/102  Pulse 148  Ht 5\' 9"  (1.753 m)  Wt 182 lb (82.555 kg)  BMI 26.86 kg/m2 Well nourished, well developed, in no acute distress HEENT: normal Neck: no JVD, no carotid bruits Cardiac:  normal S1, S2; RRR;  Lungs:  clear to auscultation bilaterally, no wheezing, rhonchi or rales Abd: soft, nontender, no hepatomegaly Ext: no edema Skin: warm and dry Neuro:   no focal abnormalities noted  EKG:  AFib, RVR PVC    ASSESSMENT AND PLAN:  Coronary atherosclerosis of native coronary artery  Stop Aspirin Tablet, 81 MG, 1 tablet, Orally, Once a day Notes: No angina. Bare metal stent in circumflex so no PLavix at this time.    2. Essential hypertension, benign  Using Benicar HCT Tablet, 40-25 MG, 1/2 tablet, Orally, Once a day as needed Increase Metoprolol Succinate Tablet Extended Release 24 Hour, 50  MG, 1 tablet, Orally, Once a day Notes: BP is controlled at home.     3. Hyperlipidemia  Continue atorvastatin Tablet, 40 MG, 1 tablet every evening, Orally, Once a day Continue Fish Oil Capsule, 1000 MG, 2 capsules daily, Orally, Once a day Notes: LDL 108.LDL target <100,    4. Reflux esophagitis  Continue Omeprazole Capsule Delayed Release, 20 MG, 1 capsule, Orally, Once a day Notes: Had GI bleed when off PPI.    5. Atrial fibrillation  IMAGING: EKG    Stegall,Amy 11/11/2012 10:03:49 AM > Danahi Reddish,JAY 11/11/2012 10:29:29 AM > NSR, NSST   Notes: Could have had some PAF as the cause of his palpitations. Stop aspirin.    Now back in Afib. High CHADS score.  Will start Eliquis 5 mg PO BID.  Increase Metoprolol.  Consider cardioversion in a  Month if the AFib persists. Watch for bleeding given h/o UGI bleed.   Preventive Medicine  Adult topics discussed:  Exercise: 5 days a week, at least 30 minutes of aerobic exercise.      Signed, Mina Marble, MD, [facility] 04/21/2013 3:15 PM

## 2013-04-21 NOTE — Telephone Encounter (Signed)
New problem   Pt called trying to get an appointment.  Pt is having some chest discomfort this has been off an on for some days not.  Pt would like a call back ASAP please.

## 2013-04-21 NOTE — Telephone Encounter (Signed)
Appt made for 2:45 pm today. Pt aware.  

## 2013-04-28 ENCOUNTER — Other Ambulatory Visit (HOSPITAL_COMMUNITY): Payer: BC Managed Care – PPO

## 2013-05-02 ENCOUNTER — Telehealth: Payer: Self-pay | Admitting: *Deleted

## 2013-05-02 DIAGNOSIS — R609 Edema, unspecified: Secondary | ICD-10-CM | POA: Diagnosis not present

## 2013-05-02 DIAGNOSIS — I4891 Unspecified atrial fibrillation: Secondary | ICD-10-CM | POA: Diagnosis not present

## 2013-05-02 DIAGNOSIS — R0989 Other specified symptoms and signs involving the circulatory and respiratory systems: Secondary | ICD-10-CM | POA: Diagnosis not present

## 2013-05-02 DIAGNOSIS — I509 Heart failure, unspecified: Secondary | ICD-10-CM | POA: Diagnosis not present

## 2013-05-02 DIAGNOSIS — Z5181 Encounter for therapeutic drug level monitoring: Secondary | ICD-10-CM | POA: Diagnosis not present

## 2013-05-02 DIAGNOSIS — R0609 Other forms of dyspnea: Secondary | ICD-10-CM | POA: Diagnosis not present

## 2013-05-02 NOTE — Telephone Encounter (Signed)
[employer] approved the eliquis from 04/02/2013-05/01/2013, case ID [id number]

## 2013-05-02 NOTE — Telephone Encounter (Signed)
Noted  

## 2013-05-05 DIAGNOSIS — I4891 Unspecified atrial fibrillation: Secondary | ICD-10-CM | POA: Diagnosis not present

## 2013-05-06 ENCOUNTER — Inpatient Hospital Stay (HOSPITAL_COMMUNITY)
Admission: EM | Admit: 2013-05-06 | Discharge: 2013-05-18 | DRG: 216 | Disposition: A | Discharge status: Home Health | Payer: Medicare Other | Attending: Cardiothoracic Surgery | Admitting: Cardiothoracic Surgery

## 2013-05-06 ENCOUNTER — Telehealth: Payer: Self-pay | Admitting: Interventional Cardiology

## 2013-05-06 ENCOUNTER — Encounter (HOSPITAL_COMMUNITY): Payer: Self-pay | Admitting: Emergency Medicine

## 2013-05-06 ENCOUNTER — Other Ambulatory Visit (HOSPITAL_COMMUNITY): Payer: BC Managed Care – PPO

## 2013-05-06 ENCOUNTER — Emergency Department (HOSPITAL_COMMUNITY): Payer: Medicare Other

## 2013-05-06 DIAGNOSIS — I4891 Unspecified atrial fibrillation: Secondary | ICD-10-CM

## 2013-05-06 DIAGNOSIS — Z803 Family history of malignant neoplasm of breast: Secondary | ICD-10-CM | POA: Diagnosis not present

## 2013-05-06 DIAGNOSIS — I509 Heart failure, unspecified: Secondary | ICD-10-CM

## 2013-05-06 DIAGNOSIS — M353 Polymyalgia rheumatica: Secondary | ICD-10-CM | POA: Diagnosis present

## 2013-05-06 DIAGNOSIS — I959 Hypotension, unspecified: Secondary | ICD-10-CM | POA: Diagnosis not present

## 2013-05-06 DIAGNOSIS — Z8546 Personal history of malignant neoplasm of prostate: Secondary | ICD-10-CM

## 2013-05-06 DIAGNOSIS — I059 Rheumatic mitral valve disease, unspecified: Secondary | ICD-10-CM | POA: Diagnosis not present

## 2013-05-06 DIAGNOSIS — I5033 Acute on chronic diastolic (congestive) heart failure: Secondary | ICD-10-CM | POA: Diagnosis present

## 2013-05-06 DIAGNOSIS — I480 Paroxysmal atrial fibrillation: Secondary | ICD-10-CM | POA: Diagnosis present

## 2013-05-06 DIAGNOSIS — Z823 Family history of stroke: Secondary | ICD-10-CM | POA: Diagnosis not present

## 2013-05-06 DIAGNOSIS — R0609 Other forms of dyspnea: Secondary | ICD-10-CM

## 2013-05-06 DIAGNOSIS — R0989 Other specified symptoms and signs involving the circulatory and respiratory systems: Secondary | ICD-10-CM | POA: Diagnosis not present

## 2013-05-06 DIAGNOSIS — I350 Nonrheumatic aortic (valve) stenosis: Secondary | ICD-10-CM

## 2013-05-06 DIAGNOSIS — Z8679 Personal history of other diseases of the circulatory system: Secondary | ICD-10-CM

## 2013-05-06 DIAGNOSIS — Z9861 Coronary angioplasty status: Secondary | ICD-10-CM | POA: Diagnosis not present

## 2013-05-06 DIAGNOSIS — R0602 Shortness of breath: Secondary | ICD-10-CM

## 2013-05-06 DIAGNOSIS — Z9889 Other specified postprocedural states: Secondary | ICD-10-CM

## 2013-05-06 DIAGNOSIS — Z01810 Encounter for preprocedural cardiovascular examination: Secondary | ICD-10-CM | POA: Diagnosis not present

## 2013-05-06 DIAGNOSIS — Z01818 Encounter for other preprocedural examination: Secondary | ICD-10-CM | POA: Diagnosis not present

## 2013-05-06 DIAGNOSIS — Z7901 Long term (current) use of anticoagulants: Secondary | ICD-10-CM | POA: Diagnosis not present

## 2013-05-06 DIAGNOSIS — K219 Gastro-esophageal reflux disease without esophagitis: Secondary | ICD-10-CM | POA: Diagnosis present

## 2013-05-06 DIAGNOSIS — I10 Essential (primary) hypertension: Secondary | ICD-10-CM | POA: Diagnosis present

## 2013-05-06 DIAGNOSIS — Z888 Allergy status to other drugs, medicaments and biological substances status: Secondary | ICD-10-CM | POA: Diagnosis not present

## 2013-05-06 DIAGNOSIS — J90 Pleural effusion, not elsewhere classified: Secondary | ICD-10-CM | POA: Diagnosis not present

## 2013-05-06 DIAGNOSIS — I4821 Permanent atrial fibrillation: Secondary | ICD-10-CM | POA: Diagnosis present

## 2013-05-06 DIAGNOSIS — R000 Tachycardia, unspecified: Secondary | ICD-10-CM | POA: Diagnosis not present

## 2013-05-06 DIAGNOSIS — Z951 Presence of aortocoronary bypass graft: Secondary | ICD-10-CM

## 2013-05-06 DIAGNOSIS — I499 Cardiac arrhythmia, unspecified: Secondary | ICD-10-CM | POA: Diagnosis not present

## 2013-05-06 DIAGNOSIS — R0600 Dyspnea, unspecified: Secondary | ICD-10-CM

## 2013-05-06 DIAGNOSIS — J9819 Other pulmonary collapse: Secondary | ICD-10-CM | POA: Diagnosis not present

## 2013-05-06 DIAGNOSIS — R918 Other nonspecific abnormal finding of lung field: Secondary | ICD-10-CM | POA: Diagnosis not present

## 2013-05-06 DIAGNOSIS — I359 Nonrheumatic aortic valve disorder, unspecified: Secondary | ICD-10-CM | POA: Diagnosis not present

## 2013-05-06 DIAGNOSIS — I2510 Atherosclerotic heart disease of native coronary artery without angina pectoris: Secondary | ICD-10-CM | POA: Diagnosis present

## 2013-05-06 DIAGNOSIS — Z952 Presence of prosthetic heart valve: Secondary | ICD-10-CM

## 2013-05-06 DIAGNOSIS — I2582 Chronic total occlusion of coronary artery: Secondary | ICD-10-CM | POA: Diagnosis present

## 2013-05-06 DIAGNOSIS — R600 Localized edema: Secondary | ICD-10-CM

## 2013-05-06 DIAGNOSIS — E785 Hyperlipidemia, unspecified: Secondary | ICD-10-CM | POA: Diagnosis present

## 2013-05-06 DIAGNOSIS — J811 Chronic pulmonary edema: Secondary | ICD-10-CM | POA: Diagnosis not present

## 2013-05-06 DIAGNOSIS — I4892 Unspecified atrial flutter: Secondary | ICD-10-CM | POA: Diagnosis present

## 2013-05-06 HISTORY — DX: Heart failure, unspecified: I50.9

## 2013-05-06 HISTORY — DX: Nonrheumatic aortic (valve) stenosis: I35.0

## 2013-05-06 HISTORY — DX: Atherosclerotic heart disease of native coronary artery without angina pectoris: I25.10

## 2013-05-06 HISTORY — DX: Paroxysmal atrial fibrillation: I48.0

## 2013-05-06 HISTORY — DX: Unspecified atrial fibrillation: I48.91

## 2013-05-06 HISTORY — DX: Gastrointestinal hemorrhage, unspecified: K92.2

## 2013-05-06 LAB — TROPONIN I
Troponin I: <0.3 ng/mL (ref <0.30)
Troponin I: <0.3 ng/mL (ref <0.30)
Troponin I: <0.3 ng/mL (ref <0.30)

## 2013-05-06 LAB — COMPREHENSIVE METABOLIC PANEL
ALT: 124 U/L — ABNORMAL HIGH (ref 0–53)
AST: 62 U/L — ABNORMAL HIGH (ref 0–37)
Albumin: 4 g/dL (ref 3.5–5.2)
Alkaline Phosphatase: 42 U/L (ref 39–117)
BUN: 15 mg/dL (ref 6–23)
CO2: 24 mEq/L (ref 19–32)
Calcium: 9.4 mg/dL (ref 8.4–10.5)
Chloride: 103 mEq/L (ref 96–112)
Creatinine, Ser: 0.76 mg/dL (ref 0.50–1.35)
GFR calc Af Amer: >90 mL/min (ref >90)
GFR calc non Af Amer: 82 mL/min — ABNORMAL LOW (ref >90)
Glucose, Bld: 125 mg/dL — ABNORMAL HIGH (ref 70–99)
Potassium: 4.1 mEq/L (ref 3.7–5.3)
Sodium: 141 mEq/L (ref 137–147)
Total Bilirubin: 0.8 mg/dL (ref 0.3–1.2)
Total Protein: 7 g/dL (ref 6.0–8.3)

## 2013-05-06 LAB — CBC WITH DIFFERENTIAL/PLATELET
Basophils Absolute: 0 10*3/uL (ref 0.0–0.1)
Basophils Relative: 0 % (ref 0–1)
Eosinophils Absolute: 0.1 10*3/uL (ref 0.0–0.7)
Eosinophils Relative: 1 % (ref 0–5)
HCT: 41.4 % (ref 39.0–52.0)
Hemoglobin: 14.2 g/dL (ref 13.0–17.0)
Lymphocytes Relative: 14 % (ref 12–46)
Lymphs Abs: 1.1 10*3/uL (ref 0.7–4.0)
MCH: 33.7 pg (ref 26.0–34.0)
MCHC: 34.3 g/dL (ref 30.0–36.0)
MCV: 98.3 fL (ref 78.0–100.0)
Monocytes Absolute: 0.4 10*3/uL (ref 0.1–1.0)
Monocytes Relative: 5 % (ref 3–12)
Neutro Abs: 6.1 10*3/uL (ref 1.7–7.7)
Neutrophils Relative %: 79 % — ABNORMAL HIGH (ref 43–77)
Platelets: 188 10*3/uL (ref 150–400)
RBC: 4.21 MIL/uL — ABNORMAL LOW (ref 4.22–5.81)
RDW: 15.4 % (ref 11.5–15.5)
WBC: 7.7 10*3/uL (ref 4.0–10.5)

## 2013-05-06 LAB — PROTIME-INR
INR: 1.42 (ref 0.00–1.49)
Prothrombin Time: 17 seconds — ABNORMAL HIGH (ref 11.6–15.2)

## 2013-05-06 LAB — HEPARIN LEVEL (UNFRACTIONATED): Heparin Unfractionated: 0.11 IU/mL — ABNORMAL LOW (ref 0.30–0.70)

## 2013-05-06 LAB — MRSA PCR SCREENING: MRSA by PCR: NEGATIVE

## 2013-05-06 LAB — APTT: aPTT: 44 seconds — ABNORMAL HIGH (ref 24–37)

## 2013-05-06 LAB — PRO B NATRIURETIC PEPTIDE: Pro B Natriuretic peptide (BNP): 1466 pg/mL — ABNORMAL HIGH (ref 0–450)

## 2013-05-06 MED ORDER — FUROSEMIDE 10 MG/ML IJ SOLN
60.0000 mg | Freq: Once | INTRAMUSCULAR | Status: AC
  Administered 2013-05-06: 60 mg via INTRAVENOUS
  Filled 2013-05-06: qty 6

## 2013-05-06 MED ORDER — WARFARIN SODIUM 4 MG PO TABS
4.0000 mg | ORAL_TABLET | Freq: Once | ORAL | Status: AC
  Administered 2013-05-06: 4 mg via ORAL
  Filled 2013-05-06: qty 1

## 2013-05-06 MED ORDER — SODIUM CHLORIDE 0.9 % IV SOLN
250.0000 mL | INTRAVENOUS | Status: DC | PRN
  Administered 2013-05-08: 04:00:00 via INTRAVENOUS

## 2013-05-06 MED ORDER — SODIUM CHLORIDE 0.9 % IJ SOLN
3.0000 mL | Freq: Two times a day (BID) | INTRAMUSCULAR | Status: DC
  Administered 2013-05-07 – 2013-05-08 (×4): 3 mL via INTRAVENOUS

## 2013-05-06 MED ORDER — DILTIAZEM LOAD VIA INFUSION
10.0000 mg | Freq: Once | INTRAVENOUS | Status: DC
  Filled 2013-05-06: qty 10

## 2013-05-06 MED ORDER — OMEGA-3-ACID ETHYL ESTERS 1 G PO CAPS
2.0000 g | ORAL_CAPSULE | Freq: Every day | ORAL | Status: DC
  Administered 2013-05-06 – 2013-05-08 (×3): 2 g via ORAL
  Filled 2013-05-06 (×4): qty 2

## 2013-05-06 MED ORDER — HEPARIN BOLUS VIA INFUSION
2000.0000 [IU] | Freq: Once | INTRAVENOUS | Status: AC
  Administered 2013-05-06: 2000 [IU] via INTRAVENOUS
  Filled 2013-05-06: qty 2000

## 2013-05-06 MED ORDER — DILTIAZEM HCL 100 MG IV SOLR: 5.0000 mg/h | INTRAVENOUS | Status: DC

## 2013-05-06 MED ORDER — DILTIAZEM LOAD VIA INFUSION
20.0000 mg | Freq: Once | INTRAVENOUS | Status: AC
  Administered 2013-05-06: 20 mg via INTRAVENOUS
  Filled 2013-05-06: qty 20

## 2013-05-06 MED ORDER — ATORVASTATIN CALCIUM 20 MG PO TABS
20.0000 mg | ORAL_TABLET | Freq: Every day | ORAL | Status: DC
  Administered 2013-05-06 – 2013-05-18 (×12): 20 mg via ORAL
  Filled 2013-05-06 (×13): qty 1

## 2013-05-06 MED ORDER — WARFARIN - PHARMACIST DOSING INPATIENT: Freq: Every day | Status: DC

## 2013-05-06 MED ORDER — POTASSIUM CHLORIDE CRYS ER 20 MEQ PO TBCR
20.0000 meq | EXTENDED_RELEASE_TABLET | Freq: Two times a day (BID) | ORAL | Status: DC
  Administered 2013-05-06 – 2013-05-11 (×10): 20 meq via ORAL
  Filled 2013-05-06 (×13): qty 1

## 2013-05-06 MED ORDER — DILTIAZEM HCL 100 MG IV SOLR
5.0000 mg/h | INTRAVENOUS | Status: DC
  Administered 2013-05-06 (×2): 15 mg/h via INTRAVENOUS
  Administered 2013-05-06: 7.5 mg/h via INTRAVENOUS
  Administered 2013-05-07: 20 mg/h via INTRAVENOUS
  Administered 2013-05-07: 15 mg/h via INTRAVENOUS
  Administered 2013-05-07 – 2013-05-08 (×2): 20 mg/h via INTRAVENOUS

## 2013-05-06 MED ORDER — METOPROLOL TARTRATE 1 MG/ML IV SOLN
5.0000 mg | INTRAVENOUS | Status: DC | PRN
Start: 2013-05-06 — End: 2013-05-06
  Administered 2013-05-06: 5 mg via INTRAVENOUS
  Filled 2013-05-06: qty 5

## 2013-05-06 MED ORDER — HEPARIN (PORCINE) IN NACL 100-0.45 UNIT/ML-% IJ SOLN
1400.0000 [IU]/h | INTRAMUSCULAR | Status: DC
  Administered 2013-05-06: 1150 [IU]/h via INTRAVENOUS
  Administered 2013-05-07 – 2013-05-09 (×3): 1400 [IU]/h via INTRAVENOUS
  Filled 2013-05-06 (×8): qty 250

## 2013-05-06 MED ORDER — PANTOPRAZOLE SODIUM 40 MG PO TBEC
40.0000 mg | DELAYED_RELEASE_TABLET | Freq: Every day | ORAL | Status: DC
  Administered 2013-05-07 – 2013-05-11 (×4): 40 mg via ORAL
  Filled 2013-05-06 (×4): qty 1

## 2013-05-06 MED ORDER — SODIUM CHLORIDE 0.9 % IJ SOLN: 3.0000 mL | INTRAMUSCULAR | Status: DC | PRN

## 2013-05-06 MED ORDER — FUROSEMIDE 10 MG/ML IJ SOLN
40.0000 mg | Freq: Two times a day (BID) | INTRAMUSCULAR | Status: DC
  Administered 2013-05-06 – 2013-05-09 (×6): 40 mg via INTRAVENOUS
  Filled 2013-05-06 (×8): qty 4

## 2013-05-06 MED ORDER — VITAMIN D3 25 MCG (1000 UNIT) PO TABS
1000.0000 [IU] | ORAL_TABLET | Freq: Every day | ORAL | Status: DC
  Administered 2013-05-06 – 2013-05-18 (×11): 1000 [IU] via ORAL
  Filled 2013-05-06 (×13): qty 1

## 2013-05-06 NOTE — ED Notes (Signed)
Waiting on Cardizem drip has been verified.

## 2013-05-06 NOTE — ED Notes (Signed)
Cardiologist at the bedside

## 2013-05-06 NOTE — Progress Notes (Signed)
ANTICOAGULATION CONSULT NOTE - Initial Consult  Pharmacy Consult:  Heparin Indication: atrial fibrillation  Allergies  Allergen Reactions  . Lisinopril Cough  . Kenalog [Triamcinolone Acetonide] Rash    Patient Measurements: Height: 5' 8.9" (175 cm) Weight: 182 lb 1.6 oz (82.6 kg) IBW/kg (Calculated) : 70.47 Heparin Dosing Weight: 83 kg  Vital Signs: Temp: 97.5 F (36.4 C) (03/06 0745) Temp src: Oral (03/06 0745) BP: 95/75 mmHg (03/06 0900) Pulse Rate: 117 (03/06 0900)  Labs:  Recent Labs  05/06/13 0755  HGB 14.2  HCT 41.4  PLT 188  APTT 44*  LABPROT 17.0*  INR 1.42  CREATININE 0.76  TROPONINI <0.30    Estimated Creatinine Clearance: 69.8 ml/min (by C-G formula based on Cr of 0.76).   Medical History: Past Medical History  Diagnosis Date  . Hypertension   . Prostate cancer   . Hyperlipidemia   . Carpal tunnel syndrome     bilateral, carpal tunnel release x 2  . Arrhythmia     PAF   . Polymyalgia rheumatica   . GERD (gastroesophageal reflux disease)     EGD, Dr. Toney Rakes 2008  . Esophagitis   . Esophageal ulcer     with hemorrhage, march 2011-elcer injected with epinephrine-Dr. Herbie Baltimore Buccini  . Vitamin D deficiency   . Blood loss anemia March 2011  . Hx of colonoscopy 2008    Dr. Toney Rakes, polyps- 5 yr f/u recommended  . Perforated ear drum     right ear drum, tube in left ear drum, Dr. Constance Holster       Assessment: 78 YOF with history of PAF for years presented with irregular heart rate.  He also has a history of GIB with anemia.  Pharmacy consulted to initiate IV heparin for Afib, CHADS2 = 3.  Baseline labs reviewed.   Goal of Therapy:  Heparin level 0.3-0.7 units/ml Monitor platelets by anticoagulation protocol: Yes    Plan:  - Heparin 2000 units IV bolus x 1 (reduced dose d/t elevated AST / ALT / INR and hx GIB with anemia), then - Heparin infusion at 1150 units/hr - Check 8 hr HL - Daily HL / CBC - F/U [facility]  plans    Takelia Urieta D. Mina Marble, PharmD, BCPS Pager:  [phone] 05/06/2013, 10:07 AM

## 2013-05-06 NOTE — ED Notes (Signed)
Bp-89/74- Dr. Tomi Bamberger informed about pt status. Pt in no distress at this time. Family remains at the bedside. Skin warm and dry.

## 2013-05-06 NOTE — Progress Notes (Signed)
ANTICOAGULATION CONSULT NOTE - Initial Consult  Pharmacy Consult:  Heparin Indication: atrial fibrillation  Allergies  Allergen Reactions  . Lisinopril Cough  . Kenalog [Triamcinolone Acetonide] Rash    Patient Measurements: Height: 5\' 9"  (175.3 cm) Weight: 181 lb (82.1 kg) IBW/kg (Calculated) : 70.7 Heparin Dosing Weight: 83 kg  Vital Signs: Temp: 98 F (36.7 C) (03/06 1953) Temp src: Oral (03/06 1953) BP: 112/83 mmHg (03/06 2000) Pulse Rate: 128 (03/06 2000)  Labs:  Recent Labs  05/06/13 0755 05/06/13 1647 05/06/13 2001  HGB 14.2  --   --   HCT 41.4  --   --   PLT 188  --   --   APTT 44*  --   --   LABPROT 17.0*  --   --   INR 1.42  --   --   HEPARINUNFRC  --   --  0.11*  CREATININE 0.76  --   --   TROPONINI <0.30 <0.30  --     Estimated Creatinine Clearance: 70 ml/min (by C-G formula based on Cr of 0.76).   Medical History: Past Medical History  Diagnosis Date  . Hypertension   . Prostate cancer   . Hyperlipidemia   . Carpal tunnel syndrome     bilateral, carpal tunnel release x 2  . PAF (paroxysmal atrial fibrillation)   . Polymyalgia rheumatica   . GERD (gastroesophageal reflux disease)     EGD, Dr. Toney Rakes 2008  . Esophagitis     a. 05/2009 a/w GIB.  [name] Esophageal ulcer     a. 05/2009 with hemorrhage - injected with epinephrine-Dr. Herbie Baltimore Buccini.  . Vitamin D deficiency   . Blood loss anemia     a. 05/2009 a/w GIB.  [name] Hx of colonoscopy 2008    Dr. Toney Rakes, polyps- 5 yr f/u recommended  . Perforated ear drum     right ear drum, tube in left ear drum, Dr. Constance Holster  . GI bleed     a. 05/2009 as above: with associated anemia. Lower esophageal ulcer with extensive erosive esophagitis and large clot by EGD 05/2009.   [name] CAD (coronary artery disease)     a. Cath 04/2009: BMS to LCx 04/2009; CTO of RCA with L-R collaterals, 40% ostial diag.  . Aortic stenosis     a. Mild by cath 04/2009.     Assessment: 70 YOF with history of PAF for  years presented with irregular heart rate. He is on IV heparin since INR is subtherapeutic. Heparin level bellow goal = 0.11 on 1150 units/hr. Resumed coumadin tonight. No issue with infusion no bleeding noted per RN.  Goal of Therapy:  Heparin level 0.3-0.7 units/ml Monitor platelets by anticoagulation protocol: Yes    Plan:  - Increase Heparin infusion to 1400 units/hr - Check 8 hr HL, and PT/INR in AM - Daily HL / CBC / INR - F/U [facility] plans  Maryanna Shape, PharmD, BCPS  Clinical Pharmacist  Pager: [phone]   05/06/2013, 9:01 PM

## 2013-05-06 NOTE — H&P (Signed)
History and Physical  Patient ID: Robert [name] MRN: [id number], DOB: 28-Sep-1927 Date of Encounter: 05/06/2013, 10:42 AM Primary Physician: Henrine Screws, MD Primary Cardiologist: Irish Lack  Chief Complaint: SOB Reason for Admission: afib, CHF  HPI: [name] is an 80 y/o M with history of PAF, CAD (cath 04/2009: BMS to LCx 04/2009; CTO of RCA with L-R collaterals, 40% ostial diag), mild AS by cath 04/2009, remote lower GIB 05/2009 due to esophagitis/ulcer without recurrence, prostate CA, HTN, polymyalgia rheumatica who presented to [facility] with atrial fibrillation and symptoms of CHF.  He was recently seen in the office 04/21/2013 at which time he was back in atrial fib. Metoprolol was increased from 25 to 50mg . Eliquis was started at 5mg  BID with plan for possible cardioversion in a month if atrial fib persisted. (His medicine list included Benicar HCT although he has not been taking this for 6 months per PCP due to softer BPs.) He saw his PCP about a week ago at which time the following changes were made: 1) increase Toprol to 100 due to persistently elevated HR and 2) change to Coumadin due to cost of Eliquis ($800/mo per pt), INR being managed by their office. He saw his PCP in followup again yesterday at which time HR was still poorly controlled. Metoprolol was discontinued and he was changed to Diltiazem 60mg  TID. Over the last two weeks he's had progressive SOB/DOE, orthopnea, abdominal distention, and LEE. In the ER, he is in rapid afib with HR 120s-140s. Got 5mg  IV Lopressor around 8 but HR still 130s. Received 60mg  IV Lasix and beginning to ++urinate. CXR: patchy acute basilar interstitial pulmonary opacity, suspect small effusions; ddx includes pulm edema vs atypical PNA. He is afebrile with normal WBC, intitial troponin neg, pBNP 1400. INR only 1.42. Denies chest pain or chest discomfort, syncope, nausea, bleeding, history of TIA or stroke. He actually was scheduled  for f/u echo in our office today.  Past Medical History  Diagnosis Date  . Hypertension   . Prostate cancer   . Hyperlipidemia   . Carpal tunnel syndrome     bilateral, carpal tunnel release x 2  . PAF (paroxysmal atrial fibrillation)   . Polymyalgia rheumatica   . GERD (gastroesophageal reflux disease)     EGD, Dr. Toney Rakes 2008  . Esophagitis     a. 05/2009 a/w GIB.  [name] Esophageal ulcer     a. 05/2009 with hemorrhage - injected with epinephrine-Dr. Herbie Baltimore Buccini.  . Vitamin D deficiency   . Blood loss anemia     a. 05/2009 a/w GIB.  [name] Hx of colonoscopy 2008    Dr. Toney Rakes, polyps- 5 yr f/u recommended  . Perforated ear drum     right ear drum, tube in left ear drum, Dr. Constance Holster  . GI bleed     a. 05/2009 as above: with associated anemia. Lower esophageal ulcer with extensive erosive esophagitis and large clot by EGD 05/2009.   [name] CAD (coronary artery disease)     a. Cath 04/2009: BMS to LCx 04/2009; CTO of RCA with L-R collaterals, 40% ostial diag.  . Aortic stenosis     a. Mild by cath 04/2009.     Most Recent Cardiac Studies: Cardiac Cath 04/2009 REFERRING PHYSICIAN:  Dwaine Deter, MD PROCEDURES PERFORMED: 1. Left heart catheterization. 2. Left ventriculogram. 3. Coronary angiogram. 4. Abdominal aortogram. 5. Percutaneous coronary intervention of the left circumflex. OPERATOR:  Jettie Booze, MD INDICATIONS:  Angina, shortness of breath.  PROCEDURE NARRATIVE:  The risks and benefits of cardiac catheterization were explained to the patient and informed consent was obtained.  He was brought to the cath lab.  He was prepped and draped in usual sterile fashion.  His right groin was infiltrated with 1% lidocaine.  A 5-French sheath was placed into his right femoral artery using modified Seldinger technique.  Left coronary angiography was performed using a JL-4 pigtail catheter after a 3.5 catheter was not long enough.  The catheter was advanced to the  vessel ostium under fluoroscopic guidance.  Digital angiography was performed in multiple projections using hand injection of contrast.  Right coronary artery angiography was performed using a JR- 4.0 catheter.  The catheter was advanced to the vessel ostium under fluoroscopic guidance.  Digital angiography was performed in multiple projections using hand injection of contrast.  A pigtail catheter was advanced to the ascending aorta and across the aortic valve under fluoroscopic guidance.  A power injection of contrast was performed in the RAO projection to image the left ventricle.  The catheter was pulled back under continuous pressure monitoring.  The catheter was then withdrawn to the abdominal aorta.  Power injection of contrast performed in the AP projection.  The PCI was then performed.  Please see below for details.  The sheath was removed and manual compression was used for hemostasis.  Intracoronary nitroglycerin was administered for suspected vasospasm. FINDINGS:  The left main was a short vessel and widely patent. Left circumflex is a large vessel.  There is a proximal 90% stenosis and significant ectasia after the stenosis.  OM-1 was a medium-sized vessel and widely patent.  The remainder of the circumflex was a small vessel and widely patent. The left anterior descending was calcified proximally.  There were only mild luminal irregularities throughout the course of the vessel, which did reach the apex.  The first diagonal had an ostial 40% stenosis. The right coronary artery was occluded proximally.  The conus branch did fill and appeared to provide collaterals to the mid RCA.  The distal RCA filled with left-to-right collaterals. Left ventriculogram showed hyperdynamic left ventricular function with an estimated ejection fraction of 75%. HEMODYNAMICS:  Left ventricular pressure 142/2 and LVEDP of 15 mmHg. Aortic pressure 123/56 with a mean aortic pressure of 83 mmHg. The  abdominal aortogram showed no abdominal aortic aneurysm. PCI NARRATIVE:  CLS 3.5 guiding catheter was used.  A Prowater wire was then placed across the lesion in the circumflex.  The lesion was predilated with a 2.5 x 8 apex balloon inflated to 8 atmospheres for 21 seconds.  The area was stented with a 3.0 x 12 Veriflex stent inflated to 10 atmospheres for 33 seconds.  The distal half of the stent was then postdilated with a 3.5 x 8 Voyager balloon inflated to 16 atmospheres for 15 seconds and then the very end of the stent with some balloon hanging outside of the stent was inflated to 18 atmospheres for 21 seconds.  This was done in order to ensure good apposition in the ectatic segment.  There was no residual stenosis.  The lesion length was 8 mm. IMPRESSION: 1. Two-vessel coronary artery disease.  Successful bare metal stent to     the left circumflex, chronic total occlusion of the right coronary     artery. 2. Normal left ventricular function. 3. Mild aortic stenosis. 4. No abdominal aortic aneurysm. RECOMMENDATIONS:  Continue aspirin and Plavix for a minimum of 30 days along with aggressive secondary prevention.  We will plan to medically manage his right coronary artery disease unless he has refractory Symptoms.    Surgical History:  Past Surgical History  Procedure Laterality Date  . Cardiac catheterization  2011  . Prostatectomy       Home Meds: Prior to Admission medications   Medication Sig Start Date End Date Taking? Authorizing Provider  cholecalciferol (VITAMIN D) 1000 UNITS tablet Take 1,000 Units by mouth daily.   Yes Historical Provider, MD  diltiazem (CARDIZEM) 60 MG tablet Take 60 mg by mouth 3 (three) times daily.   Yes Historical Provider, MD  furosemide (LASIX) 20 MG tablet Take 20 mg by mouth daily.   Yes Historical Provider, MD  Omega-3 Fatty Acids (FISH OIL) 1000 MG CAPS Take 2 capsules by mouth daily.   Yes Historical Provider, MD  omeprazole  (PRILOSEC) 20 MG capsule Take 20 mg by mouth daily.   Yes Historical Provider, MD  potassium chloride SA (K-DUR,KLOR-CON) 20 MEQ tablet Take 20 mEq by mouth daily.   Yes Historical Provider, MD  simvastatin (ZOCOR) 40 MG tablet Take 40 mg by mouth daily.   Yes Historical Provider, MD  warfarin (COUMADIN) 5 MG tablet Take 5 mg by mouth daily.   Yes Historical Provider, MD    Allergies:  Allergies  Allergen Reactions  . Lisinopril Cough  . Kenalog [Triamcinolone Acetonide] Rash    History   Social History  . Marital Status: Married    Spouse Name: N/A    Number of Children: N/A  . Years of Education: N/A   Occupational History  . Not on file.   Social History Main Topics  . Smoking status: Never Smoker   . Smokeless tobacco: Not on file  . Alcohol Use: No  . Drug Use: No  . Sexual Activity: Not on file   Other Topics Concern  . Not on file   Social History Narrative  . No narrative on file     Family History  Problem Relation Age of Onset  . CVA Mother   . Kidney disease Father   . Breast cancer Daughter     Review of Systems: General: negative for chills, fever, night sweats or weight changes.  Cardiovascular: see above Dermatological: negative for rash Respiratory: negative for wheezing. Occasional dry tickle cough Urologic: negative for hematuria Abdominal: negative for nausea, vomiting, diarrhea, bright red blood per rectum, melena, or hematemesis Neurologic: negative for visual changes, syncope, or dizziness All other systems reviewed and are otherwise negative except as noted above.  Labs:   Lab Results  Component Value Date   WBC 7.7 05/06/2013   HGB 14.2 05/06/2013   HCT 41.4 05/06/2013   MCV 98.3 05/06/2013   PLT 188 05/06/2013    Recent Labs Lab 05/06/13 0755  NA 141  K 4.1  CL 103  CO2 24  BUN 15  CREATININE 0.76  CALCIUM 9.4  PROT 7.0  BILITOT 0.8  ALKPHOS 42  ALT 124*  AST 62*  GLUCOSE 125*    Recent Labs  05/06/13 0755  TROPONINI  <0.30    Radiology/Studies:  Dg Chest Portable 1 View  05/06/2013   CLINICAL DATA:  80 year old male with shortness of Breath. Initial encounter.  EXAM: PORTABLE CHEST - 1 VIEW  COMPARISON:  [facility] chest radiographs 11/12/2007.  FINDINGS: Lower lung volumes. Bilateral basilar predominant increased patchy in interstitial opacity. Confluent curvilinear opacity along the minor fissure. Small bilateral pleural effusions. Allowing for portable technique Stable cardiac size and mediastinal contours.  Visualized tracheal air column is within normal limits. No pneumothorax.  IMPRESSION: Acute basilar predominant bilateral patchy and interstitial pulmonary opacity. Suspect small effusions. Top differential considerations include pulmonary edema and viral/atypical pneumonia.   Electronically Signed   By: Lars Pinks M.D.   On: 05/06/2013 08:23    EKG: atrial fib 152bpm, PVC, borderline LAD, nonspecific ST-T changes  Physical Exam: Blood pressure 106/82, pulse 83, temperature 97.5 F (36.4 C), temperature source Oral, resp. rate 20, height 5' 8.9" (1.75 m), weight 182 lb 1.6 oz (82.6 kg), SpO2 98.00%. General: Well developed, well nourished WM in no acute distress. Head: Normocephalic, atraumatic, sclera non-icteric, no xanthomas, nares are without discharge.  Neck: Negative for carotid bruits. JVD mod elevated. Lungs: Basilar crackles. No wheezes or rhonchi. Breathing is unlabored. Heart: RRR with S1 S2. Soft apical systolic murmur. No rubs or gallops appreciated. Abdomen: Soft, non-tender, non-distended with normoactive bowel sounds. No hepatomegaly. No rebound/guarding. No obvious abdominal masses. Msk:  Strength and tone appear normal for age. Extremities: No clubbing or cyanosis. 1+ LE pitting edema.  Distal pedal pulses are 2+ and equal bilaterally. Neuro: Alert and oriented X 3. No focal deficit. No facial asymmetry. Moves all extremities spontaneously. Psych:  Responds to questions  appropriately with a normal affect.    ASSESSMENT AND PLAN:  1. Acute CHF, undetermined if systolic or diastolic 2. Paroxysmal atrial fibrillation with rapid ventricular response 3. CAD s/p BMS to LCx 04/2009; CTO of RCA with L-R collaterals, 40% ostial diag 4. Mild aortic stenosis by cath 04/2009 5. Remote h/o GIB due to esophagitis and ulcer, quiescent - no further evidence of recurrence  Signed, Melina Copa PA-C 05/06/2013, 10:42 AM  Patient seen and examined with Melina Copa, PA-C. We discussed all aspects of the encounter. I agree with the assessment and plan as stated above.   Patient with acute CHF in setting of poorly controlled AF despite several attempts to increase rate control meds as outpatient. Will admit to SDU. Start dilt drip for rate control. Will need echo and TEE/DC-CV. Diurese with IV lasix. Start heparin. Continue coumadin. If EF down will need to switch diltiazem. I suspect he will need anti-arrhythmic therapy to keep AF at [place] after DC-CV.   Mikela Senn,MD 11:40 AM

## 2013-05-06 NOTE — ED Notes (Signed)
Nurse called to give report x2

## 2013-05-06 NOTE — Telephone Encounter (Signed)
New Message  Pt daughter called states that the pt is now in the hospital for irregular heart beat.

## 2013-05-06 NOTE — Progress Notes (Signed)
Robert [name] has arranged TEE/DCCV for Monday with Dr. Aundra Dubin. I wrote combined orders under sign/hold.  Caston Coopersmith PA-C

## 2013-05-06 NOTE — ED Notes (Signed)
Pt to department reporting an abnormal HR. States that he was sent here by his PCP for evaluation of the HR. Pt reports that he was SOB this morning and was recently placed placed on Cardizem yesterday. Reports that he was on lopressor but it has not been helping. Denies any chest pain.

## 2013-05-06 NOTE — Telephone Encounter (Signed)
FYI pt is currently admitted at [facility].

## 2013-05-06 NOTE — ED Notes (Signed)
Nurse called to give report x1.

## 2013-05-06 NOTE — ED Provider Notes (Signed)
CSN: [id number]     Arrival date & time 05/06/13  [id number] History   First MD Initiated Contact with Patient 05/06/13 [phone]     Chief Complaint  Patient presents with  . Irregular Heart Beat     (Consider location/radiation/quality/duration/timing/severity/associated sxs/prior Treatment) HPI Reports he's had atrial fibrillation off and on for years. He states he's never had cardioversion. He also has coronary artery disease and had 1 stent placed in 2011. His cardiologist is Dr. Ellwood Sayers. He reports for the past 2 weeks his atrial fibrillation has returned and he has not converted to sinus rhythm. He has been having problems controlling his rate. He was on metoprolol 50 mg a day,he's now on 50 mg twice a day. He was seen by his PCP and was started on oral Cardizem yesterday. He reports he has was started on eliquis which he could not afford. He was started on Coumadin 5 days ago. He reports he has been gaining weight, looking at his doctor's office notes he's gained 11 pounds. He states his abdomen is getting distended and his legs have been swelling. He reports he starting to get shortness of breath and PND. He denies chest pain, coughing or fever. He is scheduled to have an echocardiogram done today at his cardiologist's office  at 2:30 PM. He presents today for shortness of breath and uncontrolled palpitations.    PCP Dr Inda Merlin  Past Medical History  Diagnosis Date  . Hypertension   . Prostate cancer   . Hyperlipidemia   . Carpal tunnel syndrome     bilateral, carpal tunnel release x 2  . Arrhythmia     PAF   . Polymyalgia rheumatica   . GERD (gastroesophageal reflux disease)     EGD, Dr. Toney Rakes 2008  . Esophagitis   . Esophageal ulcer     with hemorrhage, march 2011-elcer injected with epinephrine-Dr. Herbie Baltimore Buccini  . Vitamin D deficiency   . Blood loss anemia March 2011  . Hx of colonoscopy 2008    Dr. Toney Rakes, polyps- 5 yr f/u recommended  . Perforated ear drum      right ear drum, tube in left ear drum, Dr. Constance Holster   Past Surgical History  Procedure Laterality Date  . Cardiac catheterization  2011    mild aortic stenosis  . Prostatectomy     Family History  Problem Relation Age of Onset  . CVA Mother   . Kidney disease Father   . Breast cancer Daughter    History  Substance Use Topics  . Smoking status: Never Smoker   . Smokeless tobacco: Not on file  . Alcohol Use: No  lives at home Lives with spouse  Review of Systems  All other systems reviewed and are negative.      Allergies  Lisinopril and Kenalog  Home Medications   Current Outpatient Rx  Name  Route  Sig  Dispense  Refill  . apixaban (ELIQUIS) 5 MG TABS tablet   Oral   Take 1 tablet (5 mg total) by mouth 2 (two) times daily.   180 tablet   2   . cholecalciferol (VITAMIN D) 1000 UNITS tablet   Oral   Take 1,000 Units by mouth daily.         . metoprolol succinate (TOPROL-XL) 50 MG 24 hr tablet      TAKE 1 TABLET twice  A DAY   90 tablet   2   . olmesartan-hydrochlorothiazide (BENICAR HCT) 40-25 MG per tablet   Oral  Take 0.5 tablets by mouth daily.         . Omega-3 Fatty Acids (FISH OIL) 1000 MG CAPS   Oral   Take 2 capsules by mouth daily.         [name] omeprazole (PRILOSEC) 20 MG capsule   Oral   Take 20 mg by mouth daily.         . simvastatin (ZOCOR) 40 MG tablet   Oral   Take 40 mg by mouth daily.          coumadin  BP 133/92  Pulse 144  Temp(Src) 97.5 F (36.4 C) (Oral)  Resp 22  SpO2 99%  Vital signs normal except tachycardia  Physical Exam  Nursing note and vitals reviewed. Constitutional: He is oriented to person, place, and time. He appears well-developed and well-nourished.  Non-toxic appearance. He does not appear ill. No distress.  HENT:  Head: Normocephalic and atraumatic.  Right Ear: External ear normal.  Left Ear: External ear normal.  Nose: Nose normal. No mucosal edema or rhinorrhea.  Mouth/Throat:  Oropharynx is clear and moist and mucous membranes are normal. No dental abscesses or uvula swelling.  Eyes: Conjunctivae and EOM are normal. Pupils are equal, round, and reactive to light.  Neck: Normal range of motion and full passive range of motion without pain. Neck supple.  Cardiovascular: Normal heart sounds.  An irregularly irregular rhythm present. Tachycardia present.  Exam reveals no gallop and no friction rub.   No murmur heard. Pulmonary/Chest: No accessory muscle usage. No respiratory distress. He has decreased breath sounds. He has no wheezes. He has no rhonchi. He has no rales. He exhibits no tenderness and no crepitus.  Abdominal: Soft. Normal appearance and bowel sounds are normal. He exhibits distension. There is no tenderness. There is no rebound and no guarding.  Musculoskeletal: Normal range of motion. He exhibits edema. He exhibits no tenderness.  Moves all extremities well.   Neurological: He is alert and oriented to person, place, and time. He has normal strength. No cranial nerve deficit.  Skin: Skin is warm, dry and intact. No rash noted. No erythema. No pallor.  Psychiatric: He has a normal mood and affect. His speech is normal and behavior is normal. His mood appears not anxious.    ED Course  Procedures (including critical care time)  Medications  metoprolol (LOPRESSOR) injection 5 mg (5 mg Intravenous Given 05/06/13 0812)  furosemide (LASIX) injection 60 mg (not administered)  Heparin per pharmacy  Patient given IV metoprolol for his heart rate, however his blood pressure dropped to 95/75 and 103/65. His heart rate did improve to the range of about 100. Fluid bolus could not be done due to him are being in congestive heart failure. Patient was tolerating it well so no further action was done.  Patient given Lasix for his congestive heart failure. His Coumadin was subtherapeutic. He was started on heparin.  09 45 Trish, [facility] states they will  admit   Labs Review Results for orders placed during the hospital encounter of 05/06/13  CBC WITH DIFFERENTIAL      Result Value Ref Range   WBC 7.7  4.0 - 10.5 K/uL   RBC 4.21 (*) 4.22 - 5.81 MIL/uL   Hemoglobin 14.2  13.0 - 17.0 g/dL   HCT 41.4  39.0 - 52.0 %   MCV 98.3  78.0 - 100.0 fL   MCH 33.7  26.0 - 34.0 pg   MCHC 34.3  30.0 - 36.0 g/dL  RDW 15.4  11.5 - 15.5 %   Platelets 188  150 - 400 K/uL   Neutrophils Relative % 79 (*) 43 - 77 %   Neutro Abs 6.1  1.7 - 7.7 K/uL   Lymphocytes Relative 14  12 - 46 %   Lymphs Abs 1.1  0.7 - 4.0 K/uL   Monocytes Relative 5  3 - 12 %   Monocytes Absolute 0.4  0.1 - 1.0 K/uL   Eosinophils Relative 1  0 - 5 %   Eosinophils Absolute 0.1  0.0 - 0.7 K/uL   Basophils Relative 0  0 - 1 %   Basophils Absolute 0.0  0.0 - 0.1 K/uL  COMPREHENSIVE METABOLIC PANEL      Result Value Ref Range   Sodium 141  137 - 147 mEq/L   Potassium 4.1  3.7 - 5.3 mEq/L   Chloride 103  96 - 112 mEq/L   CO2 24  19 - 32 mEq/L   Glucose, Bld 125 (*) 70 - 99 mg/dL   BUN 15  6 - 23 mg/dL   Creatinine, Ser 0.76  0.50 - 1.35 mg/dL   Calcium 9.4  8.4 - 10.5 mg/dL   Total Protein 7.0  6.0 - 8.3 g/dL   Albumin 4.0  3.5 - 5.2 g/dL   AST 62 (*) 0 - 37 U/L   ALT 124 (*) 0 - 53 U/L   Alkaline Phosphatase 42  39 - 117 U/L   Total Bilirubin 0.8  0.3 - 1.2 mg/dL   GFR calc non Af Amer 82 (*) >90 mL/min   GFR calc Af Amer >90  >90 mL/min  APTT      Result Value Ref Range   aPTT 44 (*) 24 - 37 seconds  PROTIME-INR      Result Value Ref Range   Prothrombin Time 17.0 (*) 11.6 - 15.2 seconds   INR 1.42  0.00 - 1.49  TROPONIN I      Result Value Ref Range   Troponin I <0.30  <0.30 ng/mL  PRO B NATRIURETIC PEPTIDE      Result Value Ref Range   Pro B Natriuretic peptide (BNP) 1466.0 (*) 0 - 450 pg/mL   Laboratory interpretation all normal except for worsening elevation of his BNP ( was in the 500's at PCP office this week), subtherapeutic INR    Imaging Review Dg  Chest Portable 1 View  05/06/2013   CLINICAL DATA:  80 year old male with shortness of Breath. Initial encounter.  EXAM: PORTABLE CHEST - 1 VIEW  COMPARISON:  [facility] chest radiographs 11/12/2007.  FINDINGS: Lower lung volumes. Bilateral basilar predominant increased patchy in interstitial opacity. Confluent curvilinear opacity along the minor fissure. Small bilateral pleural effusions. Allowing for portable technique Stable cardiac size and mediastinal contours. Visualized tracheal air column is within normal limits. No pneumothorax.  IMPRESSION: Acute basilar predominant bilateral patchy and interstitial pulmonary opacity. Suspect small effusions. Top differential considerations include pulmonary edema and viral/atypical pneumonia.   Electronically Signed   By: Lars Pinks M.D.   On: 05/06/2013 08:23     EKG Interpretation   Date/Time:  Friday May 06 2013 07:42:28 EST Ventricular Rate:  152 PR Interval:    QRS Duration: 80 QT Interval:  310 QTC Calculation: 493 R Axis:   -26 Text Interpretation:  Atrial fibrillation with rapid V-rate Ventricular  premature complex Borderline left axis deviation Repolarization  abnormality, prob rate related Since last tracing 17 May 2009 Atrial  fibrillation is now  Present heart rate has increased Confirmed by Treshaun Carrico   MD-I, Chaneka Trefz ([id number]) on 05/06/2013 8:04:26 AM      MDM   patient has long history of atrial fibrillation that has been intermittent. He has currently been a teacher fibrillation with uncontrolled rate with beta blockers and diltiazem. He is having progressively worsening congestive heart failure but has not been able to be controlled as an outpatient. Patient is being admitted for further definitive care by his cardiologist.    Final diagnoses:  Atrial fibrillation with rapid ventricular response  CHF (congestive heart failure)  DOE (dyspnea on exertion)    Plan admission   Rolland Porter, MD, FACEP   CRITICAL CARE Performed by:  Rolland Porter L Total critical care time: 33 min Critical care time was exclusive of separately billable procedures and treating other patients. Critical care was necessary to treat or prevent imminent or life-threatening deterioration. Critical care was time spent personally by me on the following activities: development of treatment plan with patient and/or surrogate as well as nursing, discussions with consultants, evaluation of patient's response to treatment, examination of patient, obtaining history from patient or surrogate, ordering and performing treatments and interventions, ordering and review of laboratory studies, ordering and review of radiographic studies, pulse oximetry and re-evaluation of patient's condition.   Janice Norrie, MD 05/06/13 1012

## 2013-05-06 NOTE — Progress Notes (Addendum)
ANTICOAGULATION CONSULT NOTE - Initial Consult  Pharmacy Consult:  Warfarin  (current heparin bridge) Indication: atrial fibrillation  Allergies  Allergen Reactions  . Lisinopril Cough  . Kenalog [Triamcinolone Acetonide] Rash    Patient Measurements: Height: 5\' 9"  (175.3 cm) Weight: 181 lb (82.1 kg) IBW/kg (Calculated) : 70.7 Heparin Dosing Weight: 83 kg  Vital Signs: Temp: 98.7 F (37.1 C) (03/06 1600) Temp src: Oral (03/06 1600) BP: 114/87 mmHg (03/06 1600) Pulse Rate: 122 (03/06 1600)  Labs:  Recent Labs  05/06/13 0755  HGB 14.2  HCT 41.4  PLT 188  APTT 44*  LABPROT 17.0*  INR 1.42  CREATININE 0.76  TROPONINI <0.30    Estimated Creatinine Clearance: 70 ml/min (by C-G formula based on Cr of 0.76).   Medical History: Past Medical History  Diagnosis Date  . Hypertension   . Prostate cancer   . Hyperlipidemia   . Carpal tunnel syndrome     bilateral, carpal tunnel release x 2  . PAF (paroxysmal atrial fibrillation)   . Polymyalgia rheumatica   . GERD (gastroesophageal reflux disease)     EGD, Dr. Toney Rakes 2008  . Esophagitis     a. 05/2009 a/w GIB.  [name] Esophageal ulcer     a. 05/2009 with hemorrhage - injected with epinephrine-Dr. Herbie [place] Buccini.  . Vitamin D deficiency   . Blood loss anemia     a. 05/2009 a/w GIB.  [name] Hx of colonoscopy 2008    Dr. Toney Rakes, polyps- 5 yr f/u recommended  . Perforated ear drum     right ear drum, tube in left ear drum, Dr. Constance Holster  . GI bleed     a. 05/2009 as above: with associated anemia. Lower esophageal ulcer with extensive erosive esophagitis and large clot by EGD 05/2009.   [name] CAD (coronary artery disease)     a. Cath 04/2009: BMS to LCx 04/2009; CTO of RCA with L-R collaterals, 40% ostial diag.  . Aortic stenosis     a. Mild by cath 04/2009.       Assessment: 26 YOF with history of PAF for years presented with irregular heart rate. Heparin IV infusion started this morning. Now to begin Coumadin.    He has a remote h/o GIB due to esophagitis and ulcer, quiescent - no further evidence of recurrence.  CHADS2 = 3.  Baseline labs reviewed.  INR = 1.42,   AST 62, ALT 124 (~ 2x normal),  Albumin = 4,  Weight = 82 kg, age 35y.  Goal of Therapy:  INR =2-3   Plan:  Coumadin 4 mg po x 1 tonight. - Daily PT/INR  Nicole Cella, RPh Clinical Pharmacist Pager: [phone] 05/06/2013, 4:17 PM

## 2013-05-07 DIAGNOSIS — I059 Rheumatic mitral valve disease, unspecified: Secondary | ICD-10-CM

## 2013-05-07 LAB — CBC
HCT: 35.9 % — ABNORMAL LOW (ref 39.0–52.0)
Hemoglobin: 12.4 g/dL — ABNORMAL LOW (ref 13.0–17.0)
MCH: 33.5 pg (ref 26.0–34.0)
MCHC: 34.5 g/dL (ref 30.0–36.0)
MCV: 97 fL (ref 78.0–100.0)
Platelets: 169 10*3/uL (ref 150–400)
RBC: 3.7 MIL/uL — ABNORMAL LOW (ref 4.22–5.81)
RDW: 15.2 % (ref 11.5–15.5)
WBC: 7 10*3/uL (ref 4.0–10.5)

## 2013-05-07 LAB — HEPATIC FUNCTION PANEL
ALT: 87 U/L — ABNORMAL HIGH (ref 0–53)
AST: 36 U/L (ref 0–37)
Albumin: 3.5 g/dL (ref 3.5–5.2)
Alkaline Phosphatase: 36 U/L — ABNORMAL LOW (ref 39–117)
Bilirubin, Direct: <0.2 mg/dL (ref 0.0–0.3)
Total Bilirubin: 0.7 mg/dL (ref 0.3–1.2)
Total Protein: 6 g/dL (ref 6.0–8.3)

## 2013-05-07 LAB — BASIC METABOLIC PANEL
BUN: 13 mg/dL (ref 6–23)
CO2: 28 mEq/L (ref 19–32)
Calcium: 9.1 mg/dL (ref 8.4–10.5)
Chloride: 100 mEq/L (ref 96–112)
Creatinine, Ser: 0.7 mg/dL (ref 0.50–1.35)
GFR calc Af Amer: >90 mL/min (ref >90)
GFR calc non Af Amer: 85 mL/min — ABNORMAL LOW (ref >90)
Glucose, Bld: 107 mg/dL — ABNORMAL HIGH (ref 70–99)
Potassium: 3.5 mEq/L — ABNORMAL LOW (ref 3.7–5.3)
Sodium: 139 mEq/L (ref 137–147)

## 2013-05-07 LAB — HEMOGLOBIN A1C
Hgb A1c MFr Bld: 5.7 % — ABNORMAL HIGH (ref <5.7)
Mean Plasma Glucose: 117 mg/dL — ABNORMAL HIGH (ref <117)

## 2013-05-07 LAB — TSH: TSH: 1.679 u[IU]/mL (ref 0.350–4.500)

## 2013-05-07 LAB — HEPARIN LEVEL (UNFRACTIONATED)
Heparin Unfractionated: 0.34 IU/mL (ref 0.30–0.70)
Heparin Unfractionated: 0.38 IU/mL (ref 0.30–0.70)

## 2013-05-07 LAB — TROPONIN I: Troponin I: <0.3 ng/mL (ref <0.30)

## 2013-05-07 LAB — PROTIME-INR
INR: 1.71 — ABNORMAL HIGH (ref 0.00–1.49)
Prothrombin Time: 19.6 seconds — ABNORMAL HIGH (ref 11.6–15.2)

## 2013-05-07 MED ORDER — METOPROLOL TARTRATE 25 MG PO TABS
25.0000 mg | ORAL_TABLET | Freq: Two times a day (BID) | ORAL | Status: DC
  Administered 2013-05-07 – 2013-05-11 (×9): 25 mg via ORAL
  Filled 2013-05-07 (×13): qty 1

## 2013-05-07 MED ORDER — WARFARIN SODIUM 5 MG PO TABS
5.0000 mg | ORAL_TABLET | Freq: Once | ORAL | Status: AC
  Administered 2013-05-07: 5 mg via ORAL
  Filled 2013-05-07: qty 1

## 2013-05-07 NOTE — Progress Notes (Signed)
ANTICOAGULATION CONSULT NOTE - Follow Up Consult  Pharmacy Consult for Heparin and Warfarin Indication: atrial fibrillation  Allergies  Allergen Reactions  . Lisinopril Cough  . Kenalog [Triamcinolone Acetonide] Rash    Patient Measurements: Height: 5\' 9"  (175.3 cm) Weight: 179 lb 4.8 oz (81.33 kg) IBW/kg (Calculated) : 70.7  Vital Signs: Temp: 98.1 F (36.7 C) (03/07 1247) Temp src: Oral (03/07 1247) BP: 110/82 mmHg (03/07 1247) Pulse Rate: 120 (03/07 0758)  Labs:  Recent Labs  05/06/13 0755 05/06/13 1647 05/06/13 2001 05/06/13 2248 05/07/13 0318 05/07/13 1234  HGB 14.2  --   --   --  12.4*  --   HCT 41.4  --   --   --  35.9*  --   PLT 188  --   --   --  169  --   APTT 44*  --   --   --   --   --   LABPROT 17.0*  --   --   --  19.6*  --   INR 1.42  --   --   --  1.71*  --   HEPARINUNFRC  --   --  0.11*  --  0.34 0.38  CREATININE 0.76  --   --   --  0.70  --   TROPONINI <0.30 <0.30  --  <0.30 <0.30  --     Estimated Creatinine Clearance: 70 ml/min (by C-G formula based on Cr of 0.7).   Medications:  Heparin 1400 units/hr  Assessment: 80 y/o M on heparin while awaiting therapeutic INR with wafarin. HL remains therapeutic at 0.38 after rate increase. Other labs as above with no noted s/s bleeding.   Patient was recently started on Coumadin PTA at 5 mg PO daily (states that he only received 2-3 days worth prior to admission).  INR today remains SUBtherapeutic at 1.71 after 4 mg dose given yesterday.   Goal of Therapy:  Heparin level 0.3-0.7 units/ml Monitor platelets by anticoagulation protocol: Yes   Plan:  - Coumadin 5 mg PO x 1 tonight - Continue heparin at 1400 units/hr - Daily CBC/HL - Monitor for bleeding  Harolyn Rutherford, PharmD Clinical Pharmacist - Resident Pager: [phone] Pharmacy: [phone] 05/07/2013 1:45 PM

## 2013-05-07 NOTE — Progress Notes (Signed)
ANTICOAGULATION CONSULT NOTE - Follow Up Consult  Pharmacy Consult for Heparin  Indication: atrial fibrillation  Allergies  Allergen Reactions  . Lisinopril Cough  . Kenalog [Triamcinolone Acetonide] Rash    Patient Measurements: Height: 5\' 9"  (175.3 cm) Weight: 179 lb 4.8 oz (81.33 kg) IBW/kg (Calculated) : 70.7  Vital Signs: Temp: 98.6 F (37 C) (03/07 0404) Temp src: Oral (03/07 0404) BP: 111/73 mmHg (03/07 0400) Pulse Rate: 118 (03/06 2200)  Labs:  Recent Labs  05/06/13 0755 05/06/13 1647 05/06/13 2001 05/06/13 2248 05/07/13 0318  HGB 14.2  --   --   --  12.4*  HCT 41.4  --   --   --  35.9*  PLT 188  --   --   --  169  APTT 44*  --   --   --   --   LABPROT 17.0*  --   --   --  19.6*  INR 1.42  --   --   --  1.71*  HEPARINUNFRC  --   --  0.11*  --  0.34  CREATININE 0.76  --   --   --   --   TROPONINI <0.30 <0.30  --  <0.30  --     Estimated Creatinine Clearance: 70 ml/min (by C-G formula based on Cr of 0.76).   Medications:  Heparin 1400 units/hr  Assessment: 80 y/o M on heparin while awaiting therapeutic INR with wafarin. HL is 0.34 after rate increase. Other labs as above.   Goal of Therapy:  Heparin level 0.3-0.7 units/ml Monitor platelets by anticoagulation protocol: Yes   Plan:  -Continue heparin at 1400 units/hr -1200 HL to confirm -Daily CBC/HL -Monitor for bleeding  Narda Bonds 05/07/2013,4:12 AM

## 2013-05-07 NOTE — Progress Notes (Signed)
Patient Name: Robert [name] Date of Encounter: 05/07/2013  Active Problems:   Coronary atherosclerosis of native coronary artery   PAF (paroxysmal atrial fibrillation)   Acute CHF   Aortic stenosis   Atrial fibrillation with RVR   Length of Stay: 1  SUBJECTIVE  Feels better, breathing OK at rest. Remains in AF with RVR despite iv diltiazem at 15 mg/h  CURRENT MEDS . atorvastatin  20 mg Oral q1800  . cholecalciferol  1,000 Units Oral Daily  . furosemide  40 mg Intravenous BID  . omega-3 acid ethyl esters  2 g Oral Daily  . pantoprazole  40 mg Oral Daily  . potassium chloride SA  20 mEq Oral BID  . sodium chloride  3 mL Intravenous Q12H  . Warfarin - Pharmacist Dosing Inpatient   Does not apply q1800    OBJECTIVE   Intake/Output Summary (Last 24 hours) at 05/07/13 1207 Last data filed at 05/07/13 1000  Gross per 24 hour  Intake 1242.5 ml  Output   3350 ml  Net -2107.5 ml   Filed Weights   05/06/13 0900 05/06/13 1600 05/07/13 0404  Weight: 82.6 kg (182 lb 1.6 oz) 82.1 kg (181 lb) 81.33 kg (179 lb 4.8 oz)    PHYSICAL EXAM Filed Vitals:   05/07/13 0003 05/07/13 0400 05/07/13 0404 05/07/13 0758  BP:  111/73  119/92  Pulse:    120  Temp: 98.6 F (37 C)  98.6 F (37 C) 98 F (36.7 C)  TempSrc: Oral  Oral Oral  Resp:  24  19  Height:      Weight:   81.33 kg (179 lb 4.8 oz)   SpO2: 94%  94% 96%   General: Alert, oriented x3, no distress Head: no evidence of trauma, PERRL, EOMI, no exophtalmos or lid lag, no myxedema, no xanthelasma; normal ears, nose and oropharynx Neck: normal jugular venous pulsations and no hepatojugular reflux; brisk carotid pulses without delay and faint bilateral carotid bruits Chest: clear to auscultation, no signs of consolidation by percussion or palpation, normal fremitus, symmetrical and full respiratory excursions Cardiovascular: normal position and quality of the apical impulse, regular rhythm, normal first and second heart  sounds, no rubs or gallops, 2/6 systolic murmur across whole precordium  Abdomen: no tenderness or distention, no masses by palpation, no abnormal pulsatility or arterial bruits, normal bowel sounds, no hepatosplenomegaly Extremities: no clubbing, cyanosis or edema; 2+ radial, ulnar and brachial pulses bilaterally; 2+ right femoral, posterior tibial and dorsalis pedis pulses; 2+ left femoral, posterior tibial and dorsalis pedis pulses; no subclavian or femoral bruits Neurological: grossly nonfocal  LABS  CBC  Recent Labs  05/06/13 0755 05/07/13 0318  WBC 7.7 7.0  NEUTROABS 6.1  --   HGB 14.2 12.4*  HCT 41.4 35.9*  MCV 98.3 97.0  PLT 188 324   Basic Metabolic Panel  Recent Labs  05/06/13 0755 05/07/13 0318  NA 141 139  K 4.1 3.5*  CL 103 100  CO2 24 28  GLUCOSE 125* 107*  BUN 15 13  CREATININE 0.76 0.70  CALCIUM 9.4 9.1   Liver Function Tests  Recent Labs  05/06/13 0755 05/07/13 0318  AST 62* 36  ALT 124* 87*  ALKPHOS 42 36*  BILITOT 0.8 0.7  PROT 7.0 6.0  ALBUMIN 4.0 3.5   No results found for this basename: LIPASE, AMYLASE,  in the last 72 hours Cardiac Enzymes  Recent Labs  05/06/13 1647 05/06/13 2248 05/07/13 0318  TROPONINI <0.30 <0.30 <0.30  BNP No components found with this basename: POCBNP,  D-Dimer No results found for this basename: DDIMER,  in the last 72 hours Hemoglobin A1C  Recent Labs  05/06/13 2001  HGBA1C 5.7*   Fasting Lipid Panel No results found for this basename: CHOL, HDL, LDLCALC, TRIG, CHOLHDL, LDLDIRECT,  in the last 72 hours Thyroid Function Tests  Recent Labs  05/06/13 2001  TSH 1.679    Radiology Studies Imaging results have been reviewed and Dg Chest Portable 1 View  05/06/2013   CLINICAL DATA:  80 year old male with shortness of Breath. Initial encounter.  EXAM: PORTABLE CHEST - 1 VIEW  COMPARISON:  [facility] chest radiographs 11/12/2007.  FINDINGS: Lower lung volumes. Bilateral basilar predominant  increased patchy in interstitial opacity. Confluent curvilinear opacity along the minor fissure. Small bilateral pleural effusions. Allowing for portable technique Stable cardiac size and mediastinal contours. Visualized tracheal air column is within normal limits. No pneumothorax.  IMPRESSION: Acute basilar predominant bilateral patchy and interstitial pulmonary opacity. Suspect small effusions. Top differential considerations include pulmonary edema and viral/atypical pneumonia.   Electronically Signed   By: Lars Pinks M.D.   On: 05/06/2013 08:23    TELE AF with RVR  ECG AFib, diffuse ST-T changes  ECHO Left ventricle: The cavity size was normal. Wall thickness was increased in a pattern of mild LVH. Systolic function was normal. The estimated ejection fraction was in the range of 55% to 60%. - Aortic valve: There was severe stenosis. Trivial regurgitation. Valve area: 0.6cm^2(VTI). Valve area: 0.56cm^2 (Vmax).Mean gradient: 60mm Hg (S). Peak gradient: 37mm Hg (S).    ASSESSMENT AND PLAN Appears to have symptomatic severe AS. Will probably need AVR with biological prosthesis, preceded by R and L heart cath on Monday. Will hold off on cardioversion if surgery is to be done soon. Needs better rate control. Add beta blocker   Sanda Klein, MD, [facility] [phone] office [phone] pager 05/07/2013 12:07 PM

## 2013-05-07 NOTE — Progress Notes (Signed)
Echo Lab  2D Echocardiogram completed.  [place], RDCS 05/07/2013 8:35 AM

## 2013-05-08 LAB — CBC
HCT: 35.6 % — ABNORMAL LOW (ref 39.0–52.0)
Hemoglobin: 12.4 g/dL — ABNORMAL LOW (ref 13.0–17.0)
MCH: 33.5 pg (ref 26.0–34.0)
MCHC: 34.8 g/dL (ref 30.0–36.0)
MCV: 96.2 fL (ref 78.0–100.0)
Platelets: 163 10*3/uL (ref 150–400)
RBC: 3.7 MIL/uL — ABNORMAL LOW (ref 4.22–5.81)
RDW: 14.9 % (ref 11.5–15.5)
WBC: 6.5 10*3/uL (ref 4.0–10.5)

## 2013-05-08 LAB — BASIC METABOLIC PANEL
BUN: 9 mg/dL (ref 6–23)
CO2: 28 mEq/L (ref 19–32)
Calcium: 9.3 mg/dL (ref 8.4–10.5)
Chloride: 101 mEq/L (ref 96–112)
Creatinine, Ser: 0.7 mg/dL (ref 0.50–1.35)
GFR calc Af Amer: >90 mL/min (ref >90)
GFR calc non Af Amer: 85 mL/min — ABNORMAL LOW (ref >90)
Glucose, Bld: 117 mg/dL — ABNORMAL HIGH (ref 70–99)
Potassium: 3.9 mEq/L (ref 3.7–5.3)
Sodium: 141 mEq/L (ref 137–147)

## 2013-05-08 LAB — PROTIME-INR
INR: 1.82 — ABNORMAL HIGH (ref 0.00–1.49)
Prothrombin Time: 20.5 seconds — ABNORMAL HIGH (ref 11.6–15.2)

## 2013-05-08 LAB — HEPARIN LEVEL (UNFRACTIONATED): Heparin Unfractionated: 0.36 IU/mL (ref 0.30–0.70)

## 2013-05-08 MED ORDER — SODIUM CHLORIDE 0.9 % IJ SOLN: 3.0000 mL | Freq: Two times a day (BID) | INTRAMUSCULAR | Status: DC

## 2013-05-08 MED ORDER — SODIUM CHLORIDE 0.9 % IJ SOLN: 3.0000 mL | INTRAMUSCULAR | Status: DC | PRN

## 2013-05-08 MED ORDER — SODIUM CHLORIDE 0.9 % IV SOLN: 250.0000 mL | INTRAVENOUS | Status: DC | PRN

## 2013-05-08 MED ORDER — SODIUM CHLORIDE 0.9 % IV SOLN: INTRAVENOUS | Status: DC

## 2013-05-08 MED ORDER — ASPIRIN 81 MG PO CHEW
81.0000 mg | CHEWABLE_TABLET | ORAL | Status: AC
  Administered 2013-05-09: 81 mg via ORAL
  Filled 2013-05-08: qty 1

## 2013-05-08 MED ORDER — DILTIAZEM HCL ER COATED BEADS 360 MG PO CP24
360.0000 mg | ORAL_CAPSULE | Freq: Every day | ORAL | Status: DC
  Administered 2013-05-08 – 2013-05-11 (×3): 360 mg via ORAL
  Filled 2013-05-08 (×5): qty 1

## 2013-05-08 MED ORDER — WARFARIN SODIUM 5 MG PO TABS
5.0000 mg | ORAL_TABLET | Freq: Once | ORAL | Status: DC
  Filled 2013-05-08: qty 1

## 2013-05-08 NOTE — Progress Notes (Signed)
Patient Name: Robert [name] Date of Encounter: 05/08/2013  Active Problems:   Coronary atherosclerosis of native coronary artery   PAF (paroxysmal atrial fibrillation)   Acute CHF   Aortic stenosis   Atrial fibrillation with RVR   Length of Stay: 2  SUBJECTIVE  Feels well. Walked halls without dyspnea. No angina. AF with controlled rate INR 1.7. Plan for R&L heart cath tomorrow, if INR not too high.  CURRENT MEDS . atorvastatin  20 mg Oral q1800  . cholecalciferol  1,000 Units Oral Daily  . diltiazem  360 mg Oral Daily  . furosemide  40 mg Intravenous BID  . metoprolol tartrate  25 mg Oral BID  . omega-3 acid ethyl esters  2 g Oral Daily  . pantoprazole  40 mg Oral Daily  . potassium chloride SA  20 mEq Oral BID  . sodium chloride  3 mL Intravenous Q12H  . warfarin  5 mg Oral ONCE-1800  . Warfarin - Pharmacist Dosing Inpatient   Does not apply q1800    OBJECTIVE   Intake/Output Summary (Last 24 hours) at 05/08/13 1047 Last data filed at 05/08/13 0500  Gross per 24 hour  Intake   1395 ml  Output   2225 ml  Net   -830 ml   Filed Weights   05/06/13 1600 05/07/13 0404 05/08/13 0500  Weight: 82.1 kg (181 lb) 81.33 kg (179 lb 4.8 oz) 81 kg (178 lb 9.2 oz)    PHYSICAL EXAM Filed Vitals:   05/08/13 0400 05/08/13 0500 05/08/13 0757 05/08/13 0800  BP: 91/72  120/80 120/80  Pulse:      Temp: 97.5 F (36.4 C)  98.3 F (36.8 C)   TempSrc: Oral  Oral   Resp: 17  25 20   Height:      Weight:  81 kg (178 lb 9.2 oz)    SpO2: 93%  94%    General: Alert, oriented x3, no distress  Head: no evidence of trauma, PERRL, EOMI, no exophtalmos or lid lag, no myxedema, no xanthelasma; normal ears, nose and oropharynx  Neck: normal jugular venous pulsations and no hepatojugular reflux; brisk carotid pulses without delay and faint bilateral carotid bruits  Chest: clear to auscultation, no signs of consolidation by percussion or palpation, normal fremitus, symmetrical and full  respiratory excursions  Cardiovascular: normal position and quality of the apical impulse, regular rhythm, normal first and second heart sounds, no rubs or gallops, 2/6 systolic murmur across whole precordium  Abdomen: no tenderness or distention, no masses by palpation, no abnormal pulsatility or arterial bruits, normal bowel sounds, no hepatosplenomegaly  Extremities: no clubbing, cyanosis or edema; 2+ radial, ulnar and brachial pulses bilaterally; 2+ right femoral, posterior tibial and dorsalis pedis pulses; 2+ left femoral, posterior tibial and dorsalis pedis pulses; no subclavian or femoral bruits  Neurological: grossly nonfocal   LABS  CBC  Recent Labs  05/06/13 0755 05/07/13 0318 05/08/13 0350  WBC 7.7 7.0 6.5  NEUTROABS 6.1  --   --   HGB 14.2 12.4* 12.4*  HCT 41.4 35.9* 35.6*  MCV 98.3 97.0 96.2  PLT 188 169 161   Basic Metabolic Panel  Recent Labs  05/07/13 0318 05/08/13 0350  NA 139 141  K 3.5* 3.9  CL 100 101  CO2 28 28  GLUCOSE 107* 117*  BUN 13 9  CREATININE 0.70 0.70  CALCIUM 9.1 9.3   Liver Function Tests  Recent Labs  05/06/13 0755 05/07/13 0318  AST 62* 36  ALT 124*  87*  ALKPHOS 42 36*  BILITOT 0.8 0.7  PROT 7.0 6.0  ALBUMIN 4.0 3.5   No results found for this basename: LIPASE, AMYLASE,  in the last 72 hours Cardiac Enzymes  Recent Labs  05/06/13 1647 05/06/13 2248 05/07/13 0318  TROPONINI <0.30 <0.30 <0.30   BNP No components found with this basename: POCBNP,  D-Dimer No results found for this basename: DDIMER,  in the last 72 hours Hemoglobin A1C  Recent Labs  05/06/13 2001  HGBA1C 5.7*   Fasting Lipid Panel No results found for this basename: CHOL, HDL, LDLCALC, TRIG, CHOLHDL, LDLDIRECT,  in the last 72 hours Thyroid Function Tests  Recent Labs  05/06/13 2001  TSH 1.679    Radiology Studies Imaging results have been reviewed and No results found.  TELE AF, VR 80s   ASSESSMENT AND PLAN Acute diastolic heart  failure due to AF with RVR on background of severe aortic stenosis and multivessel CAD. Plans for TEE/CV changed when severe AS was diagnosed - he will need AVR (+/- CABG, +/- PV isolation). Warfarin stopped, on IV heparin. Transition IV to PO diltiazem. INR is still increasing, but if INR remains less than 2.0 plan for R&L heart cath tomorrow (radial arterial approach may allow for lower bleeding risk, especially since no PCI is planned). This procedure has been fully reviewed with the patient and informed consent has been obtained. They understand that procedure may be delayed if anticoagulation is excessive.  He reports recent Duplex US of carotids with Dr. Varanasi (record not currently available). Never smoker., no PFTs.    Josphine Laffey, MD, FACC [facility] [phone] office [phone] pager 05/08/2013 10:47 AM   

## 2013-05-08 NOTE — Progress Notes (Addendum)
ANTICOAGULATION CONSULT NOTE - Follow Up Consult  Pharmacy Consult for Heparin and Warfarin Indication: atrial fibrillation  Allergies  Allergen Reactions  . Lisinopril Cough  . Kenalog [Triamcinolone Acetonide] Rash    Patient Measurements: Height: 5\' 9"  (175.3 cm) Weight: 178 lb 9.2 oz (81 kg) IBW/kg (Calculated) : 70.7  Vital Signs: Temp: 98.3 F (36.8 C) (03/08 0757) Temp src: Oral (03/08 0757) BP: 120/80 mmHg (03/08 0800)  Labs:  Recent Labs  05/06/13 0755 05/06/13 1647  05/06/13 2248 05/07/13 0318 05/07/13 1234 05/08/13 0350  HGB 14.2  --   --   --  12.4*  --  12.4*  HCT 41.4  --   --   --  35.9*  --  35.6*  PLT 188  --   --   --  169  --  163  APTT 44*  --   --   --   --   --   --   LABPROT 17.0*  --   --   --  19.6*  --  20.5*  INR 1.42  --   --   --  1.71*  --  1.82*  HEPARINUNFRC  --   --   < >  --  0.34 0.38 0.36  CREATININE 0.76  --   --   --  0.70  --  0.70  TROPONINI <0.30 <0.30  --  <0.30 <0.30  --   --   < > = values in this interval not displayed.  Estimated Creatinine Clearance: 70 ml/min (by C-G formula based on Cr of 0.7).   Medications:  Heparin 1400 units/hr  Assessment: 80 y/o M on heparin for Afib while awaiting therapeutic INR with warfarin. HL remains therapeutic at 0.36 after rate increase. Other labs as above with no noted s/s bleeding. Of note, patient has a history of prostate cancer and GIB in 2011 d/t esophagitis/ulcer without recurrence.   Patient was recently started on Coumadin PTA at 5 mg PO daily (states that he only received 2-3 days worth prior to admission). INR steadily increasing but remains SUBtherapeutic at 1.82. Will hold coumadin tonight as patient is to go to cath tomorrow am.   Goal of Therapy:  INR: 2-3 Heparin level 0.3-0.7 units/ml Monitor platelets by anticoagulation protocol: Yes   Plan:  - Hold coumadin tonight - Continue heparin at 1400 units/hr - Daily CBC/HL - Monitor for bleeding - F/u plans to  restart coumadin after cath  Harolyn Rutherford, PharmD Clinical Pharmacist - Resident Pager: [phone] Pharmacy: [phone] 05/08/2013 9:27 AM

## 2013-05-09 ENCOUNTER — Other Ambulatory Visit: Payer: Self-pay | Admitting: *Deleted

## 2013-05-09 ENCOUNTER — Inpatient Hospital Stay (HOSPITAL_COMMUNITY): Payer: Medicare Other

## 2013-05-09 ENCOUNTER — Encounter (HOSPITAL_COMMUNITY)
Admission: EM | Disposition: A | Discharge status: Home Health | Payer: Self-pay | Source: Home / Self Care | Attending: Cardiothoracic Surgery

## 2013-05-09 DIAGNOSIS — I359 Nonrheumatic aortic valve disorder, unspecified: Secondary | ICD-10-CM

## 2013-05-09 DIAGNOSIS — I2510 Atherosclerotic heart disease of native coronary artery without angina pectoris: Secondary | ICD-10-CM

## 2013-05-09 DIAGNOSIS — I509 Heart failure, unspecified: Secondary | ICD-10-CM

## 2013-05-09 HISTORY — PX: LEFT AND RIGHT HEART CATHETERIZATION WITH CORONARY ANGIOGRAM: SHX5449

## 2013-05-09 LAB — PROTIME-INR
INR: 1.79 — ABNORMAL HIGH (ref 0.00–1.49)
Prothrombin Time: 20.3 seconds — ABNORMAL HIGH (ref 11.6–15.2)

## 2013-05-09 LAB — POCT I-STAT 3, VENOUS BLOOD GAS (G3P V)
Acid-Base Excess: 5 mmol/L — ABNORMAL HIGH (ref 0.0–2.0)
Bicarbonate: 30 mEq/L — ABNORMAL HIGH (ref 20.0–24.0)
O2 Saturation: 58 %
TCO2: 31 mmol/L (ref 0–100)
pCO2, Ven: 45.6 mmHg (ref 45.0–50.0)
pH, Ven: 7.426 — ABNORMAL HIGH (ref 7.250–7.300)
pO2, Ven: 30 mmHg (ref 30.0–45.0)

## 2013-05-09 LAB — BLOOD GAS, ARTERIAL
Acid-Base Excess: 4.2 mmol/L — ABNORMAL HIGH (ref 0.0–2.0)
Bicarbonate: 28.2 mEq/L — ABNORMAL HIGH (ref 20.0–24.0)
Drawn by: 23588
O2 Saturation: 95.3 %
Patient temperature: 98.6
TCO2: 29.5 mmol/L (ref 0–100)
pCO2 arterial: 42.2 mmHg (ref 35.0–45.0)
pH, Arterial: 7.44 (ref 7.350–7.450)
pO2, Arterial: 74 mmHg — ABNORMAL LOW (ref 80.0–100.0)

## 2013-05-09 LAB — CBC
HCT: 36.6 % — ABNORMAL LOW (ref 39.0–52.0)
HCT: 41.5 % (ref 39.0–52.0)
Hemoglobin: 12.7 g/dL — ABNORMAL LOW (ref 13.0–17.0)
Hemoglobin: 14.5 g/dL (ref 13.0–17.0)
MCH: 33.7 pg (ref 26.0–34.0)
MCH: 34.1 pg — ABNORMAL HIGH (ref 26.0–34.0)
MCHC: 34.7 g/dL (ref 30.0–36.0)
MCHC: 34.9 g/dL (ref 30.0–36.0)
MCV: 97.1 fL (ref 78.0–100.0)
MCV: 97.6 fL (ref 78.0–100.0)
Platelets: 169 10*3/uL (ref 150–400)
Platelets: 204 10*3/uL (ref 150–400)
RBC: 3.77 MIL/uL — ABNORMAL LOW (ref 4.22–5.81)
RBC: 4.25 MIL/uL (ref 4.22–5.81)
RDW: 14.8 % (ref 11.5–15.5)
RDW: 14.8 % (ref 11.5–15.5)
WBC: 5.9 10*3/uL (ref 4.0–10.5)
WBC: 8.4 10*3/uL (ref 4.0–10.5)

## 2013-05-09 LAB — BASIC METABOLIC PANEL
BUN: 7 mg/dL (ref 6–23)
CO2: 26 mEq/L (ref 19–32)
Calcium: 9.1 mg/dL (ref 8.4–10.5)
Chloride: 102 mEq/L (ref 96–112)
Creatinine, Ser: 0.67 mg/dL (ref 0.50–1.35)
GFR calc Af Amer: >90 mL/min (ref >90)
GFR calc non Af Amer: 86 mL/min — ABNORMAL LOW (ref >90)
Glucose, Bld: 100 mg/dL — ABNORMAL HIGH (ref 70–99)
Potassium: 4 mEq/L (ref 3.7–5.3)
Sodium: 141 mEq/L (ref 137–147)

## 2013-05-09 LAB — POCT ACTIVATED CLOTTING TIME: Activated Clotting Time: 155 seconds

## 2013-05-09 LAB — HEPARIN LEVEL (UNFRACTIONATED): Heparin Unfractionated: 0.41 IU/mL (ref 0.30–0.70)

## 2013-05-09 LAB — POCT I-STAT 3, ART BLOOD GAS (G3+)
Acid-Base Excess: 4 mmol/L — ABNORMAL HIGH (ref 0.0–2.0)
Bicarbonate: 28.1 mEq/L — ABNORMAL HIGH (ref 20.0–24.0)
O2 Saturation: 91 %
TCO2: 29 mmol/L (ref 0–100)
pCO2 arterial: 38 mmHg (ref 35.0–45.0)
pH, Arterial: 7.477 — ABNORMAL HIGH (ref 7.350–7.450)
pO2, Arterial: 58 mmHg — ABNORMAL LOW (ref 80.0–100.0)

## 2013-05-09 SURGERY — LEFT AND RIGHT HEART CATHETERIZATION WITH CORONARY ANGIOGRAM
Anesthesia: LOCAL

## 2013-05-09 MED ORDER — SODIUM CHLORIDE 0.9 % IV BOLUS (SEPSIS): 250.0000 mL | Freq: Once | INTRAVENOUS | Status: DC

## 2013-05-09 MED ORDER — MORPHINE SULFATE 2 MG/ML IJ SOLN: 1.0000 mg | INTRAMUSCULAR | Status: DC | PRN

## 2013-05-09 MED ORDER — HEPARIN (PORCINE) IN NACL 2-0.9 UNIT/ML-% IJ SOLN
INTRAMUSCULAR | Status: AC
  Filled 2013-05-09: qty 1000

## 2013-05-09 MED ORDER — METOPROLOL TARTRATE 1 MG/ML IV SOLN
5.0000 mg | Freq: Once | INTRAVENOUS | Status: AC
  Administered 2013-05-09: 5 mg via INTRAVENOUS
  Filled 2013-05-09: qty 5

## 2013-05-09 MED ORDER — PANTOPRAZOLE SODIUM 40 MG PO TBEC
40.0000 mg | DELAYED_RELEASE_TABLET | Freq: Once | ORAL | Status: AC
  Administered 2013-05-09: 40 mg via ORAL
  Filled 2013-05-09: qty 1

## 2013-05-09 MED ORDER — NITROGLYCERIN 0.2 MG/ML ON CALL CATH LAB
INTRAVENOUS | Status: AC
Start: 2013-05-09 — End: 2013-05-09
  Filled 2013-05-09: qty 1

## 2013-05-09 MED ORDER — SODIUM CHLORIDE 0.9 % IV SOLN: INTRAVENOUS | Status: DC

## 2013-05-09 MED ORDER — SODIUM CHLORIDE 0.9 % IV SOLN
INTRAVENOUS | Status: AC
  Administered 2013-05-09: 11:00:00 via INTRAVENOUS

## 2013-05-09 MED ORDER — SODIUM CHLORIDE 0.9 % IV BOLUS (SEPSIS)
250.0000 mL | Freq: Once | INTRAVENOUS | Status: AC
  Administered 2013-05-09: 250 mL via INTRAVENOUS

## 2013-05-09 MED ORDER — LIDOCAINE HCL (PF) 1 % IJ SOLN
INTRAMUSCULAR | Status: AC
  Filled 2013-05-09: qty 30

## 2013-05-09 MED ORDER — SODIUM CHLORIDE 0.9 % IV SOLN
250.0000 mL | INTRAVENOUS | Status: DC
  Administered 2013-05-09: 250 mL via INTRAVENOUS

## 2013-05-09 MED ORDER — HEPARIN (PORCINE) IN NACL 100-0.45 UNIT/ML-% IJ SOLN
1550.0000 [IU]/h | INTRAMUSCULAR | Status: DC
  Administered 2013-05-09: 1400 [IU]/h via INTRAVENOUS
  Administered 2013-05-10 (×2): 1550 [IU]/h via INTRAVENOUS
  Filled 2013-05-09 (×6): qty 250

## 2013-05-09 MED ORDER — SODIUM CHLORIDE 0.9 % IJ SOLN
3.0000 mL | Freq: Two times a day (BID) | INTRAMUSCULAR | Status: DC
  Administered 2013-05-09 – 2013-05-11 (×4): 3 mL via INTRAVENOUS

## 2013-05-09 MED ORDER — SODIUM CHLORIDE 0.9 % IJ SOLN: 3.0000 mL | INTRAMUSCULAR | Status: DC | PRN

## 2013-05-09 NOTE — H&P (View-Only) (Signed)
Patient Name: Robert [name] Date of Encounter: 05/08/2013  Active Problems:   Coronary atherosclerosis of native coronary artery   PAF (paroxysmal atrial fibrillation)   Acute CHF   Aortic stenosis   Atrial fibrillation with RVR   Length of Stay: 2  SUBJECTIVE  Feels well. Walked halls without dyspnea. No angina. AF with controlled rate INR 1.7. Plan for R&L heart cath tomorrow, if INR not too high.  CURRENT MEDS . atorvastatin  20 mg Oral q1800  . cholecalciferol  1,000 Units Oral Daily  . diltiazem  360 mg Oral Daily  . furosemide  40 mg Intravenous BID  . metoprolol tartrate  25 mg Oral BID  . omega-3 acid ethyl esters  2 g Oral Daily  . pantoprazole  40 mg Oral Daily  . potassium chloride SA  20 mEq Oral BID  . sodium chloride  3 mL Intravenous Q12H  . warfarin  5 mg Oral ONCE-1800  . Warfarin - Pharmacist Dosing Inpatient   Does not apply q1800    OBJECTIVE   Intake/Output Summary (Last 24 hours) at 05/08/13 1047 Last data filed at 05/08/13 0500  Gross per 24 hour  Intake   1395 ml  Output   2225 ml  Net   -830 ml   Filed Weights   05/06/13 1600 05/07/13 0404 05/08/13 0500  Weight: 82.1 kg (181 lb) 81.33 kg (179 lb 4.8 oz) 81 kg (178 lb 9.2 oz)    PHYSICAL EXAM Filed Vitals:   05/08/13 0400 05/08/13 0500 05/08/13 0757 05/08/13 0800  BP: 91/72  120/80 120/80  Pulse:      Temp: 97.5 F (36.4 C)  98.3 F (36.8 C)   TempSrc: Oral  Oral   Resp: 17  25 20   Height:      Weight:  81 kg (178 lb 9.2 oz)    SpO2: 93%  94%    General: Alert, oriented x3, no distress  Head: no evidence of trauma, PERRL, EOMI, no exophtalmos or lid lag, no myxedema, no xanthelasma; normal ears, nose and oropharynx  Neck: normal jugular venous pulsations and no hepatojugular reflux; brisk carotid pulses without delay and faint bilateral carotid bruits  Chest: clear to auscultation, no signs of consolidation by percussion or palpation, normal fremitus, symmetrical and full  respiratory excursions  Cardiovascular: normal position and quality of the apical impulse, regular rhythm, normal first and second heart sounds, no rubs or gallops, 2/6 systolic murmur across whole precordium  Abdomen: no tenderness or distention, no masses by palpation, no abnormal pulsatility or arterial bruits, normal bowel sounds, no hepatosplenomegaly  Extremities: no clubbing, cyanosis or edema; 2+ radial, ulnar and brachial pulses bilaterally; 2+ right femoral, posterior tibial and dorsalis pedis pulses; 2+ left femoral, posterior tibial and dorsalis pedis pulses; no subclavian or femoral bruits  Neurological: grossly nonfocal   LABS  CBC  Recent Labs  05/06/13 0755 05/07/13 0318 05/08/13 0350  WBC 7.7 7.0 6.5  NEUTROABS 6.1  --   --   HGB 14.2 12.4* 12.4*  HCT 41.4 35.9* 35.6*  MCV 98.3 97.0 96.2  PLT 188 169 161   Basic Metabolic Panel  Recent Labs  05/07/13 0318 05/08/13 0350  NA 139 141  K 3.5* 3.9  CL 100 101  CO2 28 28  GLUCOSE 107* 117*  BUN 13 9  CREATININE 0.70 0.70  CALCIUM 9.1 9.3   Liver Function Tests  Recent Labs  05/06/13 0755 05/07/13 0318  AST 62* 36  ALT 124*  87*  ALKPHOS 42 36*  BILITOT 0.8 0.7  PROT 7.0 6.0  ALBUMIN 4.0 3.5   No results found for this basename: LIPASE, AMYLASE,  in the last 72 hours Cardiac Enzymes  Recent Labs  05/06/13 1647 05/06/13 2248 05/07/13 0318  TROPONINI <0.30 <0.30 <0.30   BNP No components found with this basename: POCBNP,  D-Dimer No results found for this basename: DDIMER,  in the last 72 hours Hemoglobin A1C  Recent Labs  05/06/13 2001  HGBA1C 5.7*   Fasting Lipid Panel No results found for this basename: CHOL, HDL, LDLCALC, TRIG, CHOLHDL, LDLDIRECT,  in the last 72 hours Thyroid Function Tests  Recent Labs  05/06/13 2001  TSH 1.679    Radiology Studies Imaging results have been reviewed and No results found.  TELE AF, VR 80s   ASSESSMENT AND PLAN Acute diastolic heart  failure due to AF with RVR on background of severe aortic stenosis and multivessel CAD. Plans for TEE/CV changed when severe AS was diagnosed - he will need AVR (+/- CABG, +/- PV isolation). Warfarin stopped, on IV heparin. Transition IV to PO diltiazem. INR is still increasing, but if INR remains less than 2.0 plan for R&L heart cath tomorrow (radial arterial approach may allow for lower bleeding risk, especially since no PCI is planned). This procedure has been fully reviewed with the patient and informed consent has been obtained. They understand that procedure may be delayed if anticoagulation is excessive.  He reports recent Duplex US of carotids with Dr. Irish Lack (record not currently available). Never smoker., no PFTs.    Sanda Klein, MD, [facility] [phone] office [phone] pager 05/08/2013 10:47 AM

## 2013-05-09 NOTE — CV Procedure (Signed)
Robert [name] is a 80 y.o. male    [id number] LOCATION:  FACILITY: [facility]  PHYSICIAN: Quay Burow, M.D. [birth year]   DATE OF PROCEDURE:  05/09/2013  DATE OF DISCHARGE:     CARDIAC CATHETERIZATION     History obtained from chart review. Robert [name] is a very pleasant 80 year old Caucasian male patient of Dr. Casandra Doffing who  was admitted with congestive heart failure. His left great catheterization performed in 2011 was notable for a chronic totally occluded RCA. He probably had a bare-metal stent placed in the circumflex in the past. He has chronic atrial fibrillation and has been on Eloquis anticoagulation switched to Coumadin because of financial constraints. He was admitted with congestive heart failure, and atrial fibrillation with rapid ventricular response. He was diuresed and his rate was controlled. A 2-D echo revealed preserved left ventricular function with critical aortic stenosis. He presents now for right and left heart cath to define his anatomy and physiology.   PROCEDURE DESCRIPTION:   The patient was brought to the second floor [facility] cath lab in the postabsorptive state. He was premedicated with Valium 5 mg by mouth. His right groinwas prepped and shaved in usual sterile fashion. Xylocaine 1% was used for local anesthesia. A 6 French sheath was inserted into the right common femoral artery using standard Seldinger technique.a 7 French sheath was placed in the right common femoral vein using standard Seldinger technique. A 7 French balloontipped thermodilution Swan-Ganz catheter was advanced right heart chambers obtaining sequential pressures and a pulmonary artery blood sample. A femoral artery blood sample was also obtained. Unable to advance the Swan to the pulmonary capillary wedge position. The 5 French right and left Judkins diagnostic catheters were used for selective coronary angiography. Left ventricular pressures were also performed after  crossing the valve with a 0.35 straight wire through the JR 4 catheter. Visipaque dye was used for the entirety of the case (50 cc). Retrograde aortic, left ventricular end pullback pressures were recorded.   HEMODYNAMICS:    AO SYSTOLIC/AO DIASTOLIC: 546/27   LV SYSTOLIC/LV DIASTOLIC: 035/00  Right atrial pressure: 10/11, mean 8  Right ventricular pressure: 39/7  Pulmonary artery pressure: 41/19 mean 29  Aortic gradient: 24 mmHg  Cardiac output by thermodilution: 3.68 L per minute with an index of 1.88 L per minute per meter squared  Aortic valve area: 0.64 centimeters squared  ANGIOGRAPHIC RESULTS:   1. Left main; widely patent  2. LAD; minor irregularities 3. Left circumflex; nondominant with a patent circumflex obtuse marginal branch stent.  4. Right coronary artery; dominant and occluded at the origin with grade 2 left-to-right collaterals 5. Left ventriculography:  not performed today.   It should be noted that there was heavy fluoroscopic ossification in the aortic valve  Impression: Mr. Bunn has severe aortic stenosis, one-vessel CAD with a chronically occluded dominant right, left right collaterals and preserved left ventricular function. He will need aortic valve replacement plus or minus left atrial surgical Maze procedure. Given the fact that he has a chronically occluded dominant RCA which is long-standing with left to right collaterals are not sure there would be any benefit to bypassing the RCA. A femoral angiogram was performed and he makes closure device was placed in the right common femoral artery to achieve hemostasis. The patient left the Cath Lab in stable condition. Heparin will be restarted per pharmacy 6 hour after sheath removal.TCTS  has been notified.   Lorretta Harp MD, [facility] 05/09/2013 9:52 AM

## 2013-05-09 NOTE — Progress Notes (Signed)
ANTICOAGULATION CONSULT NOTE - Follow Up Consult  Pharmacy Consult:  Heparin Indication: atrial fibrillation  Allergies  Allergen Reactions  . Lisinopril Cough  . Kenalog [Triamcinolone Acetonide] Rash    Patient Measurements: Height: 5\' 9"  (175.3 cm) Weight: 176 lb 12.9 oz (80.2 kg) (Scale B) IBW/kg (Calculated) : 70.7  Vital Signs: Temp: 98 F (36.7 C) (03/09 0810) Temp src: Oral (03/09 0810) BP: 100/70 mmHg (03/09 1042) Pulse Rate: 113 (03/09 1042)  Labs:  Recent Labs  05/06/13 1647  05/06/13 2248  05/07/13 0318 05/07/13 1234 05/08/13 0350 05/09/13 0550  HGB  --   --   --   < > 12.4*  --  12.4* 12.7*  HCT  --   --   --   --  35.9*  --  35.6* 36.6*  PLT  --   --   --   --  169  --  163 169  LABPROT  --   --   --   --  19.6*  --  20.5* 20.3*  INR  --   --   --   --  1.71*  --  1.82* 1.79*  HEPARINUNFRC  --   < >  --   --  0.34 0.38 0.36 0.41  CREATININE  --   --   --   --  0.70  --  0.70 0.67  TROPONINI <0.30  --  <0.30  --  <0.30  --   --   --   < > = values in this interval not displayed.  Estimated Creatinine Clearance: 70 ml/min (by C-G formula based on Cr of 0.67).     Assessment: 57 YOM with Afib previously on IV heparin bridge while INR was sub-therapeutic on Coumadin.  Coumadin discontinued for cath and to remain on hold for AVR.  Now s/p cath and patient to resume IV heparin 6 hours post sheath removal (removed ~1030).  No bleeding nor hematoma per RN.  Patient was therapeutic and stable on heparin gtt at 1400 units/hr.     Goal of Therapy:  Heparin level 0.3-0.7 units/ml Monitor platelets by anticoagulation protocol: Yes    Plan:  - At 1630, resume heparin gtt at 1400 units/hr, no bolus - Check 8 hr HL post heparin restarted - Daily HL / CBC - F/U plans    Johannah Rozas D. Mina Marble, PharmD, BCPS Pager:  [phone] 05/09/2013, 11:03 AM

## 2013-05-09 NOTE — Interval H&P Note (Signed)
Cath Lab Visit (complete for each Cath Lab visit)  Clinical Evaluation Leading to the Procedure:   ACS: no  Non-ACS:    Anginal Classification: CCS III  Anti-ischemic medical therapy: Minimal Therapy (1 class of medications)  Non-Invasive Test Results: No non-invasive testing performed  Prior CABG: No previous CABG      History and Physical Interval Note:  05/09/2013 8:55 AM  Robert [name]  has presented today for surgery, with the diagnosis of cp  The various methods of treatment have been discussed with the patient and family. After consideration of risks, benefits and other options for treatment, the patient has consented to  Procedure(s): LEFT AND RIGHT HEART CATHETERIZATION WITH CORONARY ANGIOGRAM (N/A) as a surgical intervention .  The patient's history has been reviewed, patient examined, no change in status, stable for surgery.  I have reviewed the patient's chart and labs.  Questions were answered to the patient's satisfaction.     Lorretta Harp

## 2013-05-09 NOTE — Progress Notes (Signed)
Subjective: S/p diagnostic R/LHC. Still on bedrest. Resting comfortably. Denies groin, flank and back pain.    Objective: Vital signs in last 24 hours: Temp:  [97.5 F (36.4 C)-98.2 F (36.8 C)] 98 F (36.7 C) (03/09 0810) Pulse Rate:  [63-129] 113 (03/09 1042) Resp:  [18-20] 20 (03/09 0810) BP: (95-129)/(67-96) 100/70 mmHg (03/09 1042) SpO2:  [95 %-100 %] 95 % (03/09 0810) Weight:  [176 lb 12.9 oz (80.2 kg)-179 lb 12.8 oz (81.557 kg)] 176 lb 12.9 oz (80.2 kg) (03/09 0618) Last BM Date: 05/09/13  Intake/Output from previous day: 03/08 0701 - 03/09 0700 In: 1031.5 [P.O.:480; I.V.:551.5] Out: 2325 [Urine:2325] Intake/Output this shift: Total I/O In: 0  Out: 850 [Urine:850]  Medications Current Facility-Administered Medications  Medication Dose Route Frequency Provider Last Rate Last Dose  . 0.9 %  sodium chloride infusion  250 mL Intravenous Continuous Dayna N Dunn, PA-C 1 mL/hr at 05/09/13 1045 250 mL at 05/09/13 1045  . 0.9 %  sodium chloride infusion   Intravenous Continuous Lorretta Harp, MD 50 mL/hr at 05/09/13 1045    . atorvastatin (LIPITOR) tablet 20 mg  20 mg Oral q1800 Dayna N Dunn, PA-C   20 mg at 05/08/13 1858  . cholecalciferol (VITAMIN D) tablet 1,000 Units  1,000 Units Oral Daily Dayna N Dunn, PA-C   1,000 Units at 05/08/13 0946  . diltiazem (CARDIZEM CD) 24 hr capsule 360 mg  360 mg Oral Daily Mihai Croitoru, MD   360 mg at 05/08/13 1228  . heparin ADULT infusion 100 units/mL (25000 units/250 mL)  1,400 Units/hr Intravenous Continuous [employer], [facility]      . metoprolol tartrate (LOPRESSOR) tablet 25 mg  25 mg Oral BID Sanda Klein, MD   25 mg at 05/08/13 2228  . morphine 2 MG/ML injection 1 mg  1 mg Intravenous Q1H PRN Lorretta Harp, MD      . omega-3 acid ethyl esters (LOVAZA) capsule 2 g  2 g Oral Daily Dayna N Dunn, PA-C   2 g at 05/08/13 0946  . pantoprazole (PROTONIX) EC tablet 40 mg  40 mg Oral Daily Dayna N Dunn, PA-C   40 mg at 05/08/13 0946   . potassium chloride SA (K-DUR,KLOR-CON) CR tablet 20 mEq  20 mEq Oral BID Dayna N Dunn, PA-C   20 mEq at 05/08/13 2228  . sodium chloride 0.9 % injection 3 mL  3 mL Intravenous Q12H Dayna N Dunn, PA-C   3 mL at 05/09/13 1045  . sodium chloride 0.9 % injection 3 mL  3 mL Intravenous PRN Charlie Pitter, PA-C        PE: General appearance: alert, cooperative and no distress Lungs: clear to auscultation bilaterally Heart: irregularly irregular rhythm and 3/6 SM heard throughout the precordium Extremities: no LEE Pulses: 2+ and symmetric Skin: warm and dry Neurologic: Grossly normal Pulses are trace - 1 +   Lab Results:   Recent Labs  05/07/13 0318 05/08/13 0350 05/09/13 0550  WBC 7.0 6.5 5.9  HGB 12.4* 12.4* 12.7*  HCT 35.9* 35.6* 36.6*  PLT 169 163 169   BMET  Recent Labs  05/07/13 0318 05/08/13 0350 05/09/13 0550  NA 139 141 141  K 3.5* 3.9 4.0  CL 100 101 102  CO2 28 28 26   GLUCOSE 107* 117* 100*  BUN 13 9 7   CREATININE 0.70 0.70 0.67  CALCIUM 9.1 9.3 9.1   PT/INR  Recent Labs  05/07/13 0318 05/08/13 0350 05/09/13 0550  LABPROT 19.6* 20.5* 20.3*  INR 1.71* 1.82* 1.79*     Assessment/Plan    Active Problems:   Coronary atherosclerosis of native coronary artery   PAF (paroxysmal atrial fibrillation)   Acute CHF   Aortic stenosis   Atrial fibrillation with RVR  Plan: s/p R/LHC earlier today with Dr. Gwenlyn Found revealing severe aortic stenosis, one-vessel CAD with a chronically occluded dominant right, left right collaterals and preserved left ventricular function. He will need aortic valve replacement plus or minus left atrial surgical Maze procedure. Given the fact that he has a chronically occluded dominant RCA which is long-standing with left to right collaterals, ? if there would be any benefit to bypassing the RCA. Right atrial pressure: 10/11, mean 8.  Right ventricular pressure: 39/7 . Pulmonary artery pressure: 41/19 mean 29.  Aortic gradient: 24 mmHg.  Cardiac output by thermodilution: 3.68 L per minute with an index of 1.88 L per minute per meter squared.  Aortic valve area: 0.64 centimeters squared. The procedure was performed via the right femoral artery. He remains on bed rest. He denies any anterior groin, flank or back pain. He is mildly tachycardic, but no chest pain or SOB. BP is stable. Will consult CT surgery regarding AVR +/- MAZE procedure. Will continue to monitor. MD to follow.     LOS: 3 days    Brittainy M. Ladoris Gene 05/09/2013 12:21 PM  Attending Note:   The patient was seen and examined.  Agree with assessment and plan as noted above.  Changes made to the above note as needed.  1.  Aortic stenosis:  He has had cath today. Has a chronically occluded RCA ( with intact left to right collaterals) and severe AS.  For TCTS consult  2. Atrial fib:  He is a bit tachycardic today following cath.  Cont medical therapy for rate control for now.    Thayer Headings, Brooke Bonito., MD, [facility] 05/09/2013, 1:11 PM

## 2013-05-09 NOTE — Consult Note (Signed)
[address]       [place] [address]             [phone]        Jeri E Storrs [facility] Medical Record [id number] Date of Birth: 03/08/1927  Referring: No ref. provider found Primary Care: Henrine Screws, MD  Chief Complaint:    Chief Complaint  Patient presents with  . Irregular Heart Beat   patient examined, coronary angiogram and right heart cath and echocardiogram reviewed  History of Present Illness:     80 year old Caucasian male nondiabetic with 2 years of atrial fibrillation and prior history of mild aortic stenosis was admitted for the first time for heart failure-acute diastolic-with shortness of breath orthopnea ankle edema and rapid atrial fibrillation. He has history of CAD and had a circumflex stent in 2011 at which time his right coronary was chronically occluded and collateralized. He denies chest pains recent weight. His enzymes were negative. A 2-D echocardiogram was performed showing LVH, diastolic dysfunction, severe aortic stenosis with calculated valve area 0.6 and a heavily calcified aortic valve. There is mild MR  He is been on chronic Coumadin for A. fib and after his INR improved he underwent right and left heart cath today. This demonstrated is chronically occluded RCA is no significant left main or LAD circumflex disease. The valve was crossed, aorta hernia 24. PA pressures 42/21, cardiac output 3.8, CVP 8, aortic valve area 0.64   the patient will be started on IV heparin and his Coumadin will be held pending surgical evaluation.  The patient's past history is significant for upper GI bleeding from a lower esophageal ulcer in the past. He underwent suprapubic prostatectomy for adenocarcinoma prostate in the past. He is edentulous with total dental plates. He is a never smoker. Despite age 81 patient still very active, he uses an axe to split wood. He is a retired Furniture conservator/restorer. He is married. He plays a fiddle in a  band.  Current Activity/ Functional Status: Admitted with class 4 diastolic heart failure the patient's activity level was good last month.   Zubrod Score: At the time of surgery this patient's most appropriate activity status/level should be described as: []     0    Normal activity, no symptoms []     1    Restricted in physical strenuous activity but ambulatory, able to do out light work [x]     2    Ambulatory and capable of self care, unable to do work activities, up and about                 more than 50%  Of the time                            []     3    Only limited self care, in bed greater than 50% of waking hours []     4    Completely disabled, no self care, confined to bed or chair []     5    Moribund  Past Medical History  Diagnosis Date  . Hypertension   . Prostate cancer   . Hyperlipidemia   . Carpal tunnel syndrome     bilateral, carpal tunnel release x 2  . PAF (paroxysmal atrial fibrillation)   . Polymyalgia rheumatica   . GERD (gastroesophageal reflux disease)     EGD, Dr. Toney Rakes 2008  . Esophagitis  a. 05/2009 a/w GIB.  [name] Esophageal ulcer     a. 05/2009 with hemorrhage - injected with epinephrine-Dr. Herbie Baltimore Buccini.  . Vitamin D deficiency   . Blood loss anemia     a. 05/2009 a/w GIB.  [name] Hx of colonoscopy 2008    Dr. Toney Rakes, polyps- 5 yr f/u recommended  . Perforated ear drum     right ear drum, tube in left ear drum, Dr. Constance Holster  . GI bleed     a. 05/2009 as above: with associated anemia. Lower esophageal ulcer with extensive erosive esophagitis and large clot by EGD 05/2009.   [name] CAD (coronary artery disease)     a. Cath 04/2009: BMS to LCx 04/2009; CTO of RCA with L-R collaterals, 40% ostial diag.  . Aortic stenosis     a. Mild by cath 04/2009.    Past Surgical History  Procedure Laterality Date  . Cardiac catheterization  2011  . Prostatectomy      History  Smoking status  . Never Smoker   Smokeless tobacco  . Not on file     History  Alcohol Use No    History   Social History  . Marital Status: Married    Spouse Name: N/A    Number of Children: N/A  . Years of Education: N/A   Occupational History  . Not on file.   Social History Main Topics  . Smoking status: Never Smoker   . Smokeless tobacco: Not on file  . Alcohol Use: No  . Drug Use: No  . Sexual Activity: Not on file   Other Topics Concern  . Not on file   Social History Narrative  . No narrative on file    Allergies  Allergen Reactions  . Lisinopril Cough  . Kenalog [Triamcinolone Acetonide] Rash    Current Facility-Administered Medications  Medication Dose Route Frequency Provider Last Rate Last Dose  . 0.9 %  sodium chloride infusion  250 mL Intravenous Continuous Dayna N Dunn, PA-C 1 mL/hr at 05/09/13 1045 250 mL at 05/09/13 1045  . atorvastatin (LIPITOR) tablet 20 mg  20 mg Oral q1800 Dayna N Dunn, PA-C   20 mg at 05/09/13 1656  . cholecalciferol (VITAMIN D) tablet 1,000 Units  1,000 Units Oral Daily Dayna N Dunn, PA-C   1,000 Units at 05/08/13 0946  . diltiazem (CARDIZEM CD) 24 hr capsule 360 mg  360 mg Oral Daily Mihai Croitoru, MD   360 mg at 05/08/13 1228  . heparin ADULT infusion 100 units/mL (25000 units/250 mL)  1,400 Units/hr Intravenous Continuous Saundra Shelling, RPH 14 mL/hr at 05/09/13 1651 1,400 Units/hr at 05/09/13 1651  . metoprolol tartrate (LOPRESSOR) tablet 25 mg  25 mg Oral BID Sanda Klein, MD   25 mg at 05/08/13 2228  . morphine 2 MG/ML injection 1 mg  1 mg Intravenous Q1H PRN Lorretta Harp, MD      . pantoprazole (PROTONIX) EC tablet 40 mg  40 mg Oral Daily Dayna N Dunn, PA-C   40 mg at 05/08/13 0946  . potassium chloride SA (K-DUR,KLOR-CON) CR tablet 20 mEq  20 mEq Oral BID Dayna N Dunn, PA-C   20 mEq at 05/08/13 2228  . sodium chloride 0.9 % injection 3 mL  3 mL Intravenous Q12H Dayna N Dunn, PA-C   3 mL at 05/09/13 1045  . sodium chloride 0.9 % injection 3 mL  3 mL Intravenous PRN Charlie Pitter, PA-C  Prescriptions prior to admission  Medication Sig Dispense Refill  . cholecalciferol (VITAMIN D) 1000 UNITS tablet Take 1,000 Units by mouth daily.      [name] diltiazem (CARDIZEM) 60 MG tablet Take 60 mg by mouth 3 (three) times daily.      . furosemide (LASIX) 20 MG tablet Take 20 mg by mouth daily.      . Omega-3 Fatty Acids (FISH OIL) 1000 MG CAPS Take 2 capsules by mouth daily.      [name] omeprazole (PRILOSEC) 20 MG capsule Take 20 mg by mouth daily.      . potassium chloride SA (K-DUR,KLOR-CON) 20 MEQ tablet Take 20 mEq by mouth daily.      . simvastatin (ZOCOR) 40 MG tablet Take 40 mg by mouth daily.      [name] warfarin (COUMADIN) 5 MG tablet Take 5 mg by mouth daily.        Family History  Problem Relation Age of Onset  . CVA Mother   . Kidney disease Father   . Breast cancer Daughter      Review of Systems:  The patient denies decreased vision from his PMR the patient is right-hand dominant The patient has varicose veins right greater than left legs Cardiac catheterization was performed today via right femoral artery and vein Patient had a colonoscopy in the past which was unremarkable The patient had no anesthetic or bleeding complications from his suprapubic prostatectomy in the past     Cardiac Review of Systems: Y or N  Chest Pain [ N.   ]  Resting SOB [Y.  ] Exertional SOB  [Y.  ]  Orthopnea [ Y. ]   Pedal Edema [ Y. ]    Palpitations [ Y. ] Syncope  [ N. ]   Presyncope [ Y.  ]  General Review of Systems: [Y] = yes [  ]=no Constitional: recent weight change [  ]; anorexia [  ]; fatigue [  ]; nausea [  ]; night sweats [  ]; fever [  ]; or chills [  ]                                                               Dental: poor dentition[  ]; Last Dentist visit: Greater than one year he is edentulous  Eye : blurred vision [  ]; diplopia [   ]; vision changes [  ];  Amaurosis fugax[  ]; Resp: cough [  ];  wheezing[  ];  hemoptysis[  ]; shortness of breath[  ]; paroxysmal  nocturnal dyspnea[  ]; dyspnea on exertion[  ]; or orthopnea[  ];  GI:  gallstones[  ], vomiting[  ];  dysphagia[  ]; melena[  ];  hematochezia [  ]; heartburn[  ];   Hx of  Colonoscopy[  ]; GU: kidney stones [  ]; hematuria[  ];   dysuria [  ];  nocturia[  ];  history of     obstruction [  ]; urinary frequency [  ]Has history of lower esophageal ulcer now resolved on Prilosec             Skin: rash, swelling[  ];, hair loss[  ];  peripheral edema[  ];  or itching[  ]; Musculosketetal: myalgias[  ];  joint swelling[  ];  joint erythema[  ];  joint pain[  ];  back pain[  ];  Heme/Lymph: bruising[  ];  bleeding[  ];  anemia[  ];  Neuro: TIA[  ];  headaches[  ];  stroke[  ];  vertigo[  ];  seizures[  ];   paresthesias[  ];  difficulty walking[  ];  Psych:depression[  ]; anxiety[  ];  Endocrine: diabetes[ no ];  thyroid dysfunction[  ];  Immunizations: Flu [  ]; Pneumococcal[  ];  Other:  Physical Exam: BP 129/96  Pulse 149  Temp(Src) 97.9 F (36.6 C) (Oral)  Resp 20  Ht 5\' 9"  (1.753 m)  Wt 176 lb 12.9 oz (80.2 kg)  BMI 26.10 kg/m2  SpO2 94% Gen. appearance-appears younger than his stated age in no acute distress HEENT-pupils equal edentulous with upper and lower plates Neck-no JVD mass or bruit Thorax-no deformity or tenderness, breath sounds with basilar crackles Cardiac-irregular rhythm rapid with 2/6 systolic murmur Abdomen soft without pulsatile mass or tenderness Extremities without clubbing cyanosis or edema, compression dressing intact right groin from cardiac cath Neuro alert and oriented without focal motor deficit    Diagnostic Studies & Laboratory data:   Echocardiogram cardiac cath chest x-ray all reviewed personally  Recent Radiology Findings:   No results found.    Recent Lab Findings: Lab Results  Component Value Date   WBC 5.9 05/09/2013   HGB 12.7* 05/09/2013   HCT 36.6* 05/09/2013   PLT 169 05/09/2013   GLUCOSE 100* 05/09/2013   ALT 87* 05/07/2013   AST 36 05/07/2013    NA 141 05/09/2013   K 4.0 05/09/2013   CL 102 05/09/2013   CREATININE 0.67 05/09/2013   BUN 7 05/09/2013   CO2 26 05/09/2013   TSH 1.679 05/06/2013   INR 1.79* 05/09/2013   HGBA1C 5.7* 05/06/2013      Assessment / Plan:     Very active 80 year old gentleman with extremely symptomatic rapid atrial fib and aortic stenosis would benefit from combined aVR-maze procedure  Prior to surgery he'll need PFTs and chest CT scan to assess for aortic calcification. Previous carotid Dopplers and and without significant disease. His INR needs to normalize prior to surgery and we'll plan OR  later this week. This plan was discussed with patient and wife and he understand and agree. Thank you for the consultation       @ME1 @ 05/09/2013 4:59 PM

## 2013-05-09 NOTE — Progress Notes (Signed)
Patient evaluated for community based chronic disease management services with [facility] as a benefit of patient's [employer]. Spoke with patient and wife at bedside to explain [facility] services.  They have elected to decline services at this time citing that they can self manage.  Left contact information and [facility] literature at bedside. Made Inpatient Case Manager aware that [facility] has consulted. Of note, [facility] Care Management services does not replace or interfere with any services that are arranged by inpatient case management or social work.  For additional questions or referrals please contact Corliss Blacker BSN RN [facility] Liaison at [phone].

## 2013-05-09 NOTE — Progress Notes (Signed)
Utilization Review Completed Aniel Hubble J. Maricia Scotti, RN, BSN, NCM [phone]  

## 2013-05-10 ENCOUNTER — Inpatient Hospital Stay (HOSPITAL_COMMUNITY): Payer: Medicare Other

## 2013-05-10 ENCOUNTER — Encounter (HOSPITAL_COMMUNITY): Payer: Medicare Other

## 2013-05-10 LAB — CBC
HCT: 39.6 % (ref 39.0–52.0)
Hemoglobin: 13.8 g/dL (ref 13.0–17.0)
MCH: 34.2 pg — ABNORMAL HIGH (ref 26.0–34.0)
MCHC: 34.8 g/dL (ref 30.0–36.0)
MCV: 98 fL (ref 78.0–100.0)
Platelets: 158 10*3/uL (ref 150–400)
RBC: 4.04 MIL/uL — ABNORMAL LOW (ref 4.22–5.81)
RDW: 14.8 % (ref 11.5–15.5)
WBC: 6.7 10*3/uL (ref 4.0–10.5)

## 2013-05-10 LAB — PROTIME-INR
INR: 1.6 — ABNORMAL HIGH (ref 0.00–1.49)
Prothrombin Time: 18.6 seconds — ABNORMAL HIGH (ref 11.6–15.2)

## 2013-05-10 LAB — SURGICAL PCR SCREEN
MRSA, PCR: POSITIVE — AB
Staphylococcus aureus: POSITIVE — AB

## 2013-05-10 LAB — HEPARIN LEVEL (UNFRACTIONATED)
Heparin Unfractionated: 0.23 IU/mL — ABNORMAL LOW (ref 0.30–0.70)
Heparin Unfractionated: 0.43 IU/mL (ref 0.30–0.70)

## 2013-05-10 LAB — HEMOGLOBIN A1C
Hgb A1c MFr Bld: 5.7 % — ABNORMAL HIGH (ref <5.7)
Mean Plasma Glucose: 117 mg/dL — ABNORMAL HIGH (ref <117)

## 2013-05-10 LAB — PREPARE RBC (CROSSMATCH)

## 2013-05-10 LAB — PREALBUMIN: Prealbumin: 18.3 mg/dL (ref 17.0–34.0)

## 2013-05-10 MED ORDER — DIAZEPAM 2 MG PO TABS
2.0000 mg | ORAL_TABLET | Freq: Once | ORAL | Status: AC
  Administered 2013-05-12: 2 mg via ORAL
  Filled 2013-05-10: qty 1

## 2013-05-10 MED ORDER — BISACODYL 5 MG PO TBEC
5.0000 mg | DELAYED_RELEASE_TABLET | Freq: Once | ORAL | Status: AC
  Administered 2013-05-11: 5 mg via ORAL

## 2013-05-10 MED ORDER — CHLORHEXIDINE GLUCONATE 4 % EX LIQD
60.0000 mL | Freq: Once | CUTANEOUS | Status: DC
  Filled 2013-05-10: qty 60

## 2013-05-10 MED ORDER — BISACODYL 5 MG PO TBEC
5.0000 mg | DELAYED_RELEASE_TABLET | Freq: Once | ORAL | Status: DC
  Filled 2013-05-10: qty 1

## 2013-05-10 MED ORDER — DIAZEPAM 2 MG PO TABS: 2.0000 mg | ORAL_TABLET | Freq: Once | ORAL | Status: DC

## 2013-05-10 MED ORDER — CHLORHEXIDINE GLUCONATE 4 % EX LIQD
60.0000 mL | Freq: Once | CUTANEOUS | Status: AC
  Administered 2013-05-12: 4 via TOPICAL
  Filled 2013-05-10: qty 60

## 2013-05-10 MED ORDER — AMIODARONE HCL 200 MG PO TABS
200.0000 mg | ORAL_TABLET | Freq: Two times a day (BID) | ORAL | Status: DC
  Administered 2013-05-10 – 2013-05-11 (×4): 200 mg via ORAL
  Filled 2013-05-10 (×6): qty 1

## 2013-05-10 MED ORDER — ALPRAZOLAM 0.25 MG PO TABS: 0.2500 mg | ORAL_TABLET | ORAL | Status: DC | PRN

## 2013-05-10 MED ORDER — CHLORHEXIDINE GLUCONATE CLOTH 2 % EX PADS
6.0000 | MEDICATED_PAD | Freq: Every day | CUTANEOUS | Status: DC
Start: 2013-05-11 — End: 2013-05-12
  Administered 2013-05-11: 6 via TOPICAL

## 2013-05-10 MED ORDER — TEMAZEPAM 15 MG PO CAPS: 15.0000 mg | ORAL_CAPSULE | Freq: Once | ORAL | Status: AC | PRN

## 2013-05-10 MED ORDER — DIGOXIN 125 MCG PO TABS
0.1250 mg | ORAL_TABLET | Freq: Every day | ORAL | Status: DC
  Administered 2013-05-10 – 2013-05-11 (×2): 0.125 mg via ORAL
  Filled 2013-05-10 (×3): qty 1

## 2013-05-10 MED ORDER — METOPROLOL TARTRATE 12.5 MG HALF TABLET
12.5000 mg | ORAL_TABLET | Freq: Once | ORAL | Status: DC
  Filled 2013-05-10: qty 1

## 2013-05-10 MED ORDER — CHLORHEXIDINE GLUCONATE 4 % EX LIQD
60.0000 mL | Freq: Once | CUTANEOUS | Status: AC
  Administered 2013-05-11: 4 via TOPICAL
  Filled 2013-05-10 (×3): qty 60

## 2013-05-10 MED ORDER — TEMAZEPAM 15 MG PO CAPS: 15.0000 mg | ORAL_CAPSULE | Freq: Once | ORAL | Status: DC | PRN

## 2013-05-10 MED ORDER — METOPROLOL TARTRATE 1 MG/ML IV SOLN
2.5000 mg | Freq: Once | INTRAVENOUS | Status: AC
  Administered 2013-05-10: 2.5 mg via INTRAVENOUS
  Filled 2013-05-10: qty 5

## 2013-05-10 MED ORDER — MUPIROCIN 2 % EX OINT
1.0000 "application " | TOPICAL_OINTMENT | Freq: Two times a day (BID) | CUTANEOUS | Status: DC
  Administered 2013-05-10 – 2013-05-11 (×3): 1 via NASAL
  Filled 2013-05-10: qty 22

## 2013-05-10 NOTE — Progress Notes (Addendum)
Subjective: W/o complaints. Denies CP, SOB, palpitations, dizziness.   Objective: Vital signs in last 24 hours: Temp:  [97.9 F (36.6 C)-98.1 F (36.7 C)] 98 F (36.7 C) (03/10 1011) Pulse Rate:  [85-149] 135 (03/10 1104) Resp:  [18-20] 20 (03/10 1011) BP: (89-129)/(57-96) 111/78 mmHg (03/10 1011) SpO2:  [93 %-99 %] 99 % (03/10 1011) Weight:  [174 lb 3.2 oz (79.017 kg)] 174 lb 3.2 oz (79.017 kg) (03/10 0550) Last BM Date: 05/09/13  Intake/Output from previous day: 03/09 0701 - 03/10 0700 In: 254 [P.O.:240; I.V.:14] Out: 2000 [Urine:2000] Intake/Output this shift: Total I/O In: 240 [P.O.:240] Out: -   Medications Current Facility-Administered Medications  Medication Dose Route Frequency Provider Last Rate Last Dose  . 0.9 %  sodium chloride infusion  250 mL Intravenous Continuous Dayna N Dunn, PA-C 1 mL/hr at 05/09/13 1045 250 mL at 05/09/13 1045  . ALPRAZolam Duanne Moron) tablet 0.25-0.5 mg  0.25-0.5 mg Oral Q4H PRN Ivin Poot, MD      . amiodarone (PACERONE) tablet 200 mg  200 mg Oral BID Ivin Poot, MD   200 mg at 05/10/13 1104  . atorvastatin (LIPITOR) tablet 20 mg  20 mg Oral q1800 Dayna N Dunn, PA-C   20 mg at 05/09/13 1656  . cholecalciferol (VITAMIN D) tablet 1,000 Units  1,000 Units Oral Daily Dayna N Dunn, PA-C   1,000 Units at 05/10/13 1012  . digoxin (LANOXIN) tablet 0.125 mg  0.125 mg Oral Daily Ivin Poot, MD   0.125 mg at 05/10/13 1104  . diltiazem (CARDIZEM CD) 24 hr capsule 360 mg  360 mg Oral Daily Mihai Croitoru, MD   360 mg at 05/10/13 1012  . heparin ADULT infusion 100 units/mL (25000 units/250 mL)  1,550 Units/hr Intravenous Continuous Narda Bonds, RPH 15.5 mL/hr at 05/10/13 0617 1,550 Units/hr at 05/10/13 0617  . metoprolol tartrate (LOPRESSOR) tablet 25 mg  25 mg Oral BID Mihai Croitoru, MD   25 mg at 05/10/13 1011  . morphine 2 MG/ML injection 1 mg  1 mg Intravenous Q1H PRN Lorretta Harp, MD      . pantoprazole (PROTONIX) EC tablet 40  mg  40 mg Oral Daily Dayna N Dunn, PA-C   40 mg at 05/10/13 1015  . potassium chloride SA (K-DUR,KLOR-CON) CR tablet 20 mEq  20 mEq Oral BID Dayna N Dunn, PA-C   20 mEq at 05/10/13 1012  . sodium chloride 0.9 % injection 3 mL  3 mL Intravenous Q12H Dayna N Dunn, PA-C   3 mL at 05/10/13 1015  . sodium chloride 0.9 % injection 3 mL  3 mL Intravenous PRN Charlie Pitter, PA-C        PE: General appearance: alert, cooperative and no distress Lungs: clear to auscultation bilaterally Heart: irregularly irregular rhythm and 3/6 SM Extremities: no LEE Pulses: 2+ and symmetric Skin: warm and dry Neurologic: Grossly normal  Lab Results:   Recent Labs  05/09/13 0550 05/09/13 1853 05/10/13 0005  WBC 5.9 8.4 6.7  HGB 12.7* 14.5 13.8  HCT 36.6* 41.5 39.6  PLT 169 204 158   BMET  Recent Labs  05/08/13 0350 05/09/13 0550  NA 141 141  K 3.9 4.0  CL 101 102  CO2 28 26  GLUCOSE 117* 100*  BUN 9 7  CREATININE 0.70 0.67  CALCIUM 9.3 9.1   PT/INR  Recent Labs  05/08/13 0350 05/09/13 0550 05/10/13 0005  LABPROT 20.5* 20.3* 18.6*  INR 1.82* 1.79* 1.60*  Assessment/Plan  Active Problems:   Coronary atherosclerosis of native coronary artery   PAF (paroxysmal atrial fibrillation)   Acute CHF   Aortic stenosis   Atrial fibrillation with RVR  Plan: Awaiting combined AVR/ maze procedure. Surgery is planned for 05/12/13 with Dr. Prescott Gum. He continues to have rapid afib with rates fluctuating in the 120s-130s. BP is currently stable, but he has had issues with hypotension. He has remained asymptomatic. He is on 360 mg of Cardizem daily and 25 mg of Lopressor BID. He needs better rate control, however will need to be cautious with adjusting CCB and BB due to risk of hypotension, as this should be avoided in the setting of severe AS. ? Amiodarone. Will defer decision to MD. Warfarin remains on hold for surgery. INR continues to drift downward at 1.60. Continue IV heparin.    LOS: 4 days     Brittainy M. Ladoris Gene 05/10/2013 12:35 PM  Attending Note:   The patient was seen and examined.  Agree with assessment and plan as noted above.  Changes made to the above note as needed.  1. Aortic stenosis: Plan is for a bioprosthetic aortic valve on March 12. He will also have a maze procedure. Dr. Prescott Gum will consider bypass graft to the right coronary artery.  2. Atrial fibrillation: He is been started on low-dose amiodarone and digoxin to help control the atrial fib    Thayer Headings, Brooke Bonito., MD, [facility] 05/10/2013, 2:21 PM

## 2013-05-10 NOTE — Progress Notes (Signed)
ANTICOAGULATION CONSULT NOTE - Follow Up Consult  Pharmacy Consult for Heparin  Indication: atrial fibrillation  Allergies  Allergen Reactions  . Lisinopril Cough  . Kenalog [Triamcinolone Acetonide] Rash    Patient Measurements: Height: 5\' 9"  (175.3 cm) Weight: 176 lb 12.9 oz (80.2 kg) (Scale B) IBW/kg (Calculated) : 70.7  Vital Signs: Temp: 98 F (36.7 C) (03/10 0113) Temp src: Oral (03/10 0113) BP: 112/85 mmHg (03/10 0113) Pulse Rate: 128 (03/10 0113)  Labs:  Recent Labs  05/07/13 0318  05/08/13 0350 05/09/13 0550 05/09/13 1853 05/10/13 0005  HGB 12.4*  --  12.4* 12.7* 14.5 13.8  HCT 35.9*  --  35.6* 36.6* 41.5 39.6  PLT 169  --  163 169 204 158  LABPROT 19.6*  --  20.5* 20.3*  --  18.6*  INR 1.71*  --  1.82* 1.79*  --  1.60*  HEPARINUNFRC 0.34  < > 0.36 0.41  --  0.23*  CREATININE 0.70  --  0.70 0.67  --   --   TROPONINI <0.30  --   --   --   --   --   < > = values in this interval not displayed.  Estimated Creatinine Clearance: 70 ml/min (by C-G formula based on Cr of 0.67).   Medications:  Heparin 1400 units/hr  Assessment: 80 y/o M with HL of 0.23 after resuming heparin post-cath. Other labs as above. No issues per RN.   Goal of Therapy:  Heparin level 0.3-0.7 units/ml Monitor platelets by anticoagulation protocol: Yes   Plan:  -Increase heparin to 1550 units/hr -1100 HL -Daily CBC/HL -Monitor for bleeding  Narda Bonds 05/10/2013,2:48 AM

## 2013-05-10 NOTE — Progress Notes (Signed)
Pt's HR was on 120's-140's sustaining, asymptomatic. Dr. Claiborne Billings was notified. Will continue to monitor pt

## 2013-05-10 NOTE — Care Management Note (Addendum)
    Page 1 of 2   05/19/2013     1:40:33 PM   CARE MANAGEMENT NOTE 05/19/2013  Patient:  SHIQUAN, MATHIEU   Account Number:  [id number]  Date Initiated:  05/10/2013  Documentation initiated by:  [facility]  Subjective/Objective Assessment:   80 y/o M with history of PAF, mild AS, remote lower GIB due to esophagitis/ulcer without recurrence, prostate CA, HTN, polymyalgiarheumatica who presented to [facility] with AFIB and symptoms of CHF.//Home with spouse     Action/Plan:   Start dilt drip for rate control. Will need echo and TEE/DC-CV. Diurese with IV lasix. Start heparin. Continue coumadin. If EF down will need to switch diltiazem.//Access for [facility] services   Anticipated DC Date:  05/13/2013   Anticipated DC Plan:  [place]  CM consult      [facility] Choice  HOME HEALTH   Choice offered to / List presented to:  C-1 Patient   DME arranged  [place]      DME agency  [place] arranged  HH-1 RN  [place].   Status of service:  Completed, signed off Medicare Important Message given?   (If response is "NO", the following Medicare IM given date fields will be blank) Date Medicare IM given:   Date Additional Medicare IM given:    Discharge Disposition:  [place]  Per UR Regulation:  Reviewed for med. necessity/level of care/duration of stay  If discussed at [place] of Stay Meetings, dates discussed:   05/12/2013  05/17/2013  05/19/2013    Comments:  05/18/13 Almyra Free Kaiyden Simkin,RN,BSN [phone] PT DISCHARGING [place].  WILL NEED HH FOLLOW UP; REFERRAL TO [facility] FOR HH NEEDS, PER PT CHOICE.  START OF CARE 24-48H POST DC DATE.  REFERRAL TO [facility] FOR DME NEEDS.  05/13/13 [place] RN MSN BSN CCM Transferred to 2S s/p CABG/AVR.  On milrinone/dopamine gtts.  Lives with spouse, has supportive  dtr.

## 2013-05-10 NOTE — Progress Notes (Signed)
CARDIAC REHAB PHASE I   PRE:  Rate/Rhythm: 87 Afib  BP:  Supine:   Sitting: 96/56  Standing:    SaO2: 95 RA  MODE:  Ambulation: 460 ft   POST:  Rate/Rhythm: 131 Afib  BP:  Supine:   Sitting: 98/60  Standing:    SaO2: 97 RA 1455-1530  Assisted X 1 to ambulate, just to push IV pole. Pt's gait steady. He was able to walk 460 feet, only c/o was of some SOB. He states that his SOB is much improved since he has been in the hospital. States that at home he was not able to walk much due to the SOB. Pt's HR after walk 131 Afib. RA sat 97%. I gave pt going for heart surgery booklet, pt care guide and encouraged him to watch pre-op video. We will follow pt tomorrow to continue education and ambulation. Wife in room with pt.  Rodney Langton RN 05/10/2013 3:30 PM

## 2013-05-10 NOTE — Progress Notes (Signed)
Patient's surgical PCR positive for MRSA.  Orders placed per protocol for contact isolation order set and patient informed.  Infection prevention notified.  Noted that pre-op orders for patient's CABG and MAZE (scheduled for Thursday, 05/12/2013) showing up for today.  Notified cardiothoracic MD on call, who discontinued pre-op NPO at midnight order.  Will continue to monitor.

## 2013-05-10 NOTE — Progress Notes (Signed)
Brief Cardiology X-Cover Note  [name] is an 80 yo man with severe AS and fairly rapid atrial fibrillation with RVR at times s/p RHC/LHC yesterday who had 5 mg IV metoprolol early evening and I gave again ~ 03:xx this evening with improved HR in 110s; however, he continued to move higher. Vitals reviewed; BP 94/60  Pulse 137  Temp(Src) 98.1 F (36.7 C) (Oral)  Resp 18  Ht 5\' 9"  (1.753 m)  Wt 79.017 kg (174 lb 3.2 oz)  BMI 25.71 kg/m2  SpO2 93%On exam, irregularly irregularly, tachycardic, asymptomatic, alert, oriented, appears younger than stated age. Wife at bedside. We discussed his HR, he's on anticoagulation. Given tenuous bp with severe AS and asymptomatic status will defer further IV metoprolol or negative inotropic(diltiazem) agent for now. Could consider amiodarone. Digoxin may be helpful but will defer to day time rounding given asymptomatic status and borderline bp.    Jules Husbands, MD

## 2013-05-10 NOTE — Progress Notes (Signed)
Seen and examined by Dr. Claiborne Billings this morning.

## 2013-05-10 NOTE — Progress Notes (Signed)
CT scan completed.  Remains pain free at this time.  Heparin gtt infusing.

## 2013-05-10 NOTE — Progress Notes (Signed)
ANTICOAGULATION CONSULT NOTE - Follow Up Consult  Pharmacy Consult:  Heparin Indication: atrial fibrillation  Allergies  Allergen Reactions  . Lisinopril Cough  . Kenalog [Triamcinolone Acetonide] Rash    Patient Measurements: Height: 5\' 9"  (175.3 cm) Weight: 174 lb 3.2 oz (79.017 kg) (scale B) IBW/kg (Calculated) : 70.7  Vital Signs: Temp: 98.1 F (36.7 C) (03/10 0550) Temp src: Oral (03/10 0550) BP: 111/78 mmHg (03/10 1011) Pulse Rate: 135 (03/10 1104)  Labs:  Recent Labs  05/08/13 0350 05/09/13 0550 05/09/13 1853 05/10/13 0005 05/10/13 1023  HGB 12.4* 12.7* 14.5 13.8  --   HCT 35.6* 36.6* 41.5 39.6  --   PLT 163 169 204 158  --   LABPROT 20.5* 20.3*  --  18.6*  --   INR 1.82* 1.79*  --  1.60*  --   HEPARINUNFRC 0.36 0.41  --  0.23* 0.43  CREATININE 0.70 0.67  --   --   --     Estimated Creatinine Clearance: 70 ml/min (by C-G formula based on Cr of 0.67).     Assessment: 56 YOM with Afib previously on IV heparin bridge while INR was sub-therapeutic on Coumadin.  Coumadin discontinued for cath and to remain on hold for AVR.  Heparin resumed 05/09/13 post cath.  Heparin level therapeutic; no bleeding/hematoma reported.   Goal of Therapy:  Heparin level 0.3-0.7 units/ml Monitor platelets by anticoagulation protocol: Yes    Plan:  - Continue heparin gtt at 1550 units/hr - Daily HL / CBC    Sesar Madewell D. Mina Marble, PharmD, BCPS Pager:  [phone] 05/10/2013, 11:21 AM

## 2013-05-10 NOTE — Progress Notes (Addendum)
1 Day Post-Op Procedure(s) (LRB): LEFT AND RIGHT HEART CATHETERIZATION WITH CORONARY ANGIOGRAM (N/A) Subjective: Severe aortic stenosis, class 4 diastolic heart failure Rapid atrial fibrillation Chronically occluded RCA Patient remains stable with a heart rate today 130 beats per minute No bleeding complications with IV heparin Plan aVR, maze, possible CABG Thursday, March 12  Objective: Vital signs in last 24 hours: Temp:  [97.9 F (36.6 C)-98.1 F (36.7 C)] 98 F (36.7 C) (03/10 1011) Pulse Rate:  [85-149] 135 (03/10 1104) Cardiac Rhythm:  [-] Atrial fibrillation (03/10 0847) Resp:  [18-20] 20 (03/10 1011) BP: (89-129)/(57-96) 111/78 mmHg (03/10 1011) SpO2:  [93 %-99 %] 99 % (03/10 1011) Weight:  [174 lb 3.2 oz (79.017 kg)] 174 lb 3.2 oz (79.017 kg) (03/10 0550)  Hemodynamic parameters for last 24 hours:   afebrile, atrial fibrillation with rapid response  Intake/Output from previous day: 03/09 0701 - 03/10 0700 In: 254 [P.O.:240; I.V.:14] Out: 2000 [Urine:2000] Intake/Output this shift: Total I/O In: 690 [P.O.:690] Out: -   Sitting up comfortable speaking with his son-in-law Lungs clear Neuro intact  Lab Results:  Recent Labs  05/09/13 1853 05/10/13 0005  WBC 8.4 6.7  HGB 14.5 13.8  HCT 41.5 39.6  PLT 204 158   BMET:  Recent Labs  05/08/13 0350 05/09/13 0550  NA 141 141  K 3.9 4.0  CL 101 102  CO2 28 26  GLUCOSE 117* 100*  BUN 9 7  CREATININE 0.70 0.67  CALCIUM 9.3 9.1    PT/INR:  Recent Labs  05/10/13 0005  LABPROT 18.6*  INR 1.60*   ABG    Component Value Date/Time   PHART 7.440 05/09/2013 1655   HCO3 28.2* 05/09/2013 1655   TCO2 29.5 05/09/2013 1655   O2SAT 95.3 05/09/2013 1655   CBG (last 3)  No results found for this basename: GLUCAP,  in the last 72 hours  Assessment/Plan: S/P Procedure(s) (LRB): LEFT AND RIGHT HEART CATHETERIZATION WITH CORONARY ANGIOGRAM (N/A) Plan surgery March 12-aVR with biologic valve and left atrial Maze  procedure with possible CABG to RCA  Low-dose oral amiodarone and digoxin started to help control rapid atrial fibrillation which will be continued postop    LOS: 4 days    Robert [name],Robert [name] 05/10/2013

## 2013-05-11 ENCOUNTER — Encounter (HOSPITAL_COMMUNITY): Payer: Self-pay | Admitting: Certified Registered"

## 2013-05-11 ENCOUNTER — Inpatient Hospital Stay (HOSPITAL_COMMUNITY): Payer: Medicare Other

## 2013-05-11 DIAGNOSIS — Z01810 Encounter for preprocedural cardiovascular examination: Secondary | ICD-10-CM

## 2013-05-11 LAB — URINALYSIS, ROUTINE W REFLEX MICROSCOPIC
Bilirubin Urine: NEGATIVE
Glucose, UA: NEGATIVE mg/dL
Hgb urine dipstick: NEGATIVE
Ketones, ur: NEGATIVE mg/dL
Leukocytes, UA: NEGATIVE
Nitrite: NEGATIVE
Protein, ur: NEGATIVE mg/dL
Specific Gravity, Urine: 1.011 (ref 1.005–1.030)
Urobilinogen, UA: 1 mg/dL (ref 0.0–1.0)
pH: 6.5 (ref 5.0–8.0)

## 2013-05-11 LAB — HEPARIN LEVEL (UNFRACTIONATED): Heparin Unfractionated: 0.4 IU/mL (ref 0.30–0.70)

## 2013-05-11 LAB — BASIC METABOLIC PANEL
BUN: 10 mg/dL (ref 6–23)
CO2: 24 mEq/L (ref 19–32)
Calcium: 9.2 mg/dL (ref 8.4–10.5)
Chloride: 101 mEq/L (ref 96–112)
Creatinine, Ser: 0.69 mg/dL (ref 0.50–1.35)
GFR calc Af Amer: >90 mL/min (ref >90)
GFR calc non Af Amer: 85 mL/min — ABNORMAL LOW (ref >90)
Glucose, Bld: 108 mg/dL — ABNORMAL HIGH (ref 70–99)
Potassium: 4.2 mEq/L (ref 3.7–5.3)
Sodium: 137 mEq/L (ref 137–147)

## 2013-05-11 LAB — CBC
HCT: 39.4 % (ref 39.0–52.0)
Hemoglobin: 13.6 g/dL (ref 13.0–17.0)
MCH: 33.7 pg (ref 26.0–34.0)
MCHC: 34.5 g/dL (ref 30.0–36.0)
MCV: 97.5 fL (ref 78.0–100.0)
Platelets: 170 10*3/uL (ref 150–400)
RBC: 4.04 MIL/uL — ABNORMAL LOW (ref 4.22–5.81)
RDW: 14.8 % (ref 11.5–15.5)
WBC: 6.3 10*3/uL (ref 4.0–10.5)

## 2013-05-11 LAB — TSH: TSH: 1.463 u[IU]/mL (ref 0.350–4.500)

## 2013-05-11 MED ORDER — METOPROLOL TARTRATE 12.5 MG HALF TABLET
12.5000 mg | ORAL_TABLET | Freq: Once | ORAL | Status: AC
  Administered 2013-05-12: 12.5 mg via ORAL
  Filled 2013-05-11: qty 1

## 2013-05-11 MED ORDER — MAGNESIUM SULFATE 50 % IJ SOLN
40.0000 meq | INTRAMUSCULAR | Status: DC
  Filled 2013-05-11: qty 10

## 2013-05-11 MED ORDER — ALBUTEROL SULFATE (2.5 MG/3ML) 0.083% IN NEBU
2.5000 mg | INHALATION_SOLUTION | Freq: Once | RESPIRATORY_TRACT | Status: AC
  Administered 2013-05-11: 2.5 mg via RESPIRATORY_TRACT

## 2013-05-11 MED ORDER — SODIUM CHLORIDE 0.9 % IV SOLN
INTRAVENOUS | Status: AC
  Administered 2013-05-12: 70 mL/h via INTRAVENOUS
  Filled 2013-05-11: qty 40

## 2013-05-11 MED ORDER — FUROSEMIDE 40 MG PO TABS
40.0000 mg | ORAL_TABLET | Freq: Every day | ORAL | Status: DC
  Administered 2013-05-11: 40 mg via ORAL
  Filled 2013-05-11 (×2): qty 1

## 2013-05-11 MED ORDER — DEXTROSE 5 % IV SOLN
1.5000 g | INTRAVENOUS | Status: AC
  Administered 2013-05-12: .75 g via INTRAVENOUS
  Administered 2013-05-12: 1.5 g via INTRAVENOUS
  Filled 2013-05-11: qty 1.5

## 2013-05-11 MED ORDER — PHENYLEPHRINE HCL 10 MG/ML IJ SOLN
30.0000 ug/min | INTRAVENOUS | Status: AC
  Administered 2013-05-12: 50 ug/min via INTRAVENOUS
  Filled 2013-05-11: qty 2

## 2013-05-11 MED ORDER — DEXMEDETOMIDINE HCL IN NACL 400 MCG/100ML IV SOLN
0.1000 ug/kg/h | INTRAVENOUS | Status: AC
  Administered 2013-05-12: 0.2 ug/kg/h via INTRAVENOUS
  Filled 2013-05-11: qty 100

## 2013-05-11 MED ORDER — POTASSIUM CHLORIDE 2 MEQ/ML IV SOLN
80.0000 meq | INTRAVENOUS | Status: DC
  Filled 2013-05-11: qty 40

## 2013-05-11 MED ORDER — VANCOMYCIN HCL 10 G IV SOLR
1250.0000 mg | INTRAVENOUS | Status: AC
  Administered 2013-05-12: 1250 mg via INTRAVENOUS
  Filled 2013-05-11: qty 1250

## 2013-05-11 MED ORDER — PLASMA-LYTE 148 IV SOLN
INTRAVENOUS | Status: AC
  Administered 2013-05-12: 10:00:00
  Filled 2013-05-11: qty 2.5

## 2013-05-11 MED ORDER — DEXTROSE 5 % IV SOLN
750.0000 mg | INTRAVENOUS | Status: DC
  Filled 2013-05-11 (×2): qty 750

## 2013-05-11 MED ORDER — SODIUM CHLORIDE 0.9 % IV SOLN
INTRAVENOUS | Status: DC
  Filled 2013-05-11: qty 30

## 2013-05-11 MED ORDER — SODIUM CHLORIDE 0.9 % IV SOLN
INTRAVENOUS | Status: AC
  Administered 2013-05-12: 1.4 [IU]/h via INTRAVENOUS
  Filled 2013-05-11: qty 1

## 2013-05-11 MED ORDER — DEXTROSE 5 % IV SOLN
0.5000 ug/min | INTRAVENOUS | Status: DC
  Filled 2013-05-11: qty 4

## 2013-05-11 MED ORDER — NITROGLYCERIN IN D5W 200-5 MCG/ML-% IV SOLN
2.0000 ug/min | INTRAVENOUS | Status: AC
  Administered 2013-05-12: 5 ug/min via INTRAVENOUS
  Filled 2013-05-11: qty 250

## 2013-05-11 MED ORDER — DOPAMINE-DEXTROSE 3.2-5 MG/ML-% IV SOLN
2.0000 ug/kg/min | INTRAVENOUS | Status: AC
  Administered 2013-05-12: 3 ug/kg/min via INTRAVENOUS
  Filled 2013-05-11: qty 250

## 2013-05-11 NOTE — Progress Notes (Signed)
Subjective: W/o complaints. Denies CP, SOB, palpitations, dizziness.   Objective: Vital signs in last 24 hours: Temp:  [97.7 F (36.5 C)-97.9 F (36.6 C)] 97.8 F (36.6 C) (03/11 0527) Pulse Rate:  [100-119] 119 (03/11 0527) Resp:  [20] 20 (03/11 0527) BP: (97-107)/(59-76) 107/75 mmHg (03/11 0527) SpO2:  [96 %-97 %] 96 % (03/11 0527) Weight:  [173 lb 9.6 oz (78.744 kg)] 173 lb 9.6 oz (78.744 kg) (03/11 0527) Last BM Date: 05/11/13  Intake/Output from previous day: 03/10 0701 - 03/11 0700 In: 1140 [P.O.:1140] Out: 1545 [Urine:1545] Intake/Output this shift: Total I/O In: 120 [P.O.:120] Out: -   Medications Current Facility-Administered Medications  Medication Dose Route Frequency Provider Last Rate Last Dose  . 0.9 %  sodium chloride infusion  250 mL Intravenous Continuous Dayna N Dunn, PA-C 1 mL/hr at 05/09/13 1045 250 mL at 05/09/13 1045  . ALPRAZolam Duanne Moron) tablet 0.25-0.5 mg  0.25-0.5 mg Oral Q4H PRN Ivin Poot, MD      . amiodarone (PACERONE) tablet 200 mg  200 mg Oral BID Ivin Poot, MD   200 mg at 05/10/13 2111  . atorvastatin (LIPITOR) tablet 20 mg  20 mg Oral q1800 Dayna N Dunn, PA-C   20 mg at 05/10/13 1813  . bisacodyl (DULCOLAX) EC tablet 5 mg  5 mg Oral Once [employer], RPH      . chlorhexidine (HIBICLENS) 4 % liquid 4 application  60 mL Topical Once [employer] Hammons, RPH      . [START ON 05/12/2013] chlorhexidine (HIBICLENS) 4 % liquid 4 application  60 mL Topical Once [employer], RPH      . Chlorhexidine Gluconate Cloth 2 % PADS 6 each  6 each Topical Q0600 Jolaine Artist, MD   6 each at 05/11/13 [phone]  . cholecalciferol (VITAMIN D) tablet 1,000 Units  1,000 Units Oral Daily Dayna N Dunn, PA-C   1,000 Units at 05/10/13 1012  . [START ON 05/12/2013] diazepam (VALIUM) tablet 2 mg  2 mg Oral Once [employer] Hammons, RPH      . digoxin (LANOXIN) tablet 0.125 mg  0.125 mg Oral Daily Ivin Poot, MD   0.125  mg at 05/10/13 1104  . diltiazem (CARDIZEM CD) 24 hr capsule 360 mg  360 mg Oral Daily Mihai Croitoru, MD   360 mg at 05/10/13 1012  . furosemide (LASIX) tablet 40 mg  40 mg Oral Daily Ivin Poot, MD      . heparin ADULT infusion 100 units/mL (25000 units/250 mL)  1,550 Units/hr Intravenous Continuous Narda Bonds, RPH 15.5 mL/hr at 05/10/13 2113 1,550 Units/hr at 05/10/13 2113  . metoprolol tartrate (LOPRESSOR) tablet 25 mg  25 mg Oral BID Sanda Klein, MD   25 mg at 05/10/13 2111  . morphine 2 MG/ML injection 1 mg  1 mg Intravenous Q1H PRN Lorretta Harp, MD      . mupirocin ointment (BACTROBAN) 2 % 1 application  1 application Nasal BID Jolaine Artist, MD   1 application at 58/52/77 2227  . pantoprazole (PROTONIX) EC tablet 40 mg  40 mg Oral Daily Dayna N Dunn, PA-C   40 mg at 05/10/13 1015  . potassium chloride SA (K-DUR,KLOR-CON) CR tablet 20 mEq  20 mEq Oral BID Dayna N Dunn, PA-C   20 mEq at 05/10/13 2111  . sodium chloride 0.9 % injection 3 mL  3 mL Intravenous Q12H Dayna N Dunn, PA-C   3 mL at 05/10/13  2116  . sodium chloride 0.9 % injection 3 mL  3 mL Intravenous PRN Dayna N Dunn, PA-C      . temazepam (RESTORIL) capsule 15 mg  15 mg Oral Once PRN Rocky Crafts Hammons, RPH        PE: General appearance: alert, cooperative and no distress Lungs: clear to auscultation bilaterally Heart: irregularly irregular rhythm and 3/6 SM Extremities: no LEE Pulses: 2+ and symmetric Skin: warm and dry Neurologic: Grossly normal  Lab Results:   Recent Labs  05/09/13 1853 05/10/13 0005 05/11/13 0511  WBC 8.4 6.7 6.3  HGB 14.5 13.8 13.6  HCT 41.5 39.6 39.4  PLT 204 158 170   BMET  Recent Labs  05/09/13 0550 05/11/13 0511  NA 141 137  K 4.0 4.2  CL 102 101  CO2 26 24  GLUCOSE 100* 108*  BUN 7 10  CREATININE 0.67 0.69  CALCIUM 9.1 9.2   PT/INR  Recent Labs  05/09/13 0550 05/10/13 0005  LABPROT 20.3* 18.6*  INR 1.79* 1.60*     Assessment/Plan  Active Problems:   Coronary atherosclerosis of native coronary artery   PAF (paroxysmal atrial fibrillation)   Acute CHF   Aortic stenosis   Atrial fibrillation with RVR  Plan: Awaiting combined AVR/ maze procedure. Surgery is planned for 05/12/13 with Dr. Prescott Gum. He continues to have rapid afib with rates fluctuating in the 120s-130s. BP is currently stable, but he has had issues with hypotension. He has remained asymptomatic. He is on 360 mg of Cardizem daily and 25 mg of Lopressor BID. He needs better rate control, however will need to be cautious with adjusting CCB and BB due to risk of hypotension, as this should be avoided in the setting of severe AS. ? Amiodarone. Will defer decision to MD. Warfarin remains on hold for surgery. INR continues to drift downward at 1.60. Continue IV heparin.    1. Aortic stenosis: Plan is for a bioprosthetic aortic valve on March 12. He will also have a maze procedure. Dr. Prescott Gum will  consider bypass graft to the right coronary artery.  2. Atrial fibrillation: He is been started on low-dose amiodarone and digoxin to help control the atrial fib .  Rate is a bit high today ( is was late getting his meds).     Thayer Headings, Brooke Bonito., MD, [facility] 05/11/2013, 11:30 AM

## 2013-05-11 NOTE — Progress Notes (Signed)
[phone] Cardiac Rehab Pt returned from pulmonary function test as I was entering room. Pt's HR 130's Afib. Completed pre-op education with pt and wife. They have not had the chance to watch the heart surgery video, but they are going to tonight. We discussed sternal precautions, use of IS and walling post-op. I held walking with him due to his hr being elevated. He has not had him 10 am medications due to being gone for pulmonary test. I encouarged him to walk later today. He is very active and motivated to ambulate. Deon Pilling, RN 05/11/2013 12:29 PM

## 2013-05-11 NOTE — Progress Notes (Signed)
VASCULAR LAB PRELIMINARY  PRELIMINARY  PRELIMINARY  PRELIMINARY  Pre-op Cardiac Surgery  Carotid Findings:  Bilateral:  1-39% ICA stenosis.  Vertebral artery flow is antegrade.     Ralene Cork, RVT 05/11/2013 12:45 PM    Upper Extremity Right Left  Brachial Pressures    Radial Waveforms    Ulnar Waveforms    Palmar Arch (Allen's Test)     Findings:      Lower  Extremity Right Left  Dorsalis Pedis    Anterior Tibial    Posterior Tibial    Ankle/Brachial Indices      Findings:

## 2013-05-11 NOTE — Progress Notes (Signed)
Pharmacy Note-Anticoagulation  Pharmacy Consult :  80 y.o. male is currently on Heparin bridging prior to scheduled Thursday AVR/Maze procedure.   Latest Labs : Hematology :  Recent Labs  05/09/13 0550 05/09/13 1853 05/10/13 0005 05/10/13 1023 05/11/13 0511  HGB 12.7* 14.5 13.8  --  13.6  HCT 36.6* 41.5 39.6  --  39.4  PLT 169 204 158  --  170  LABPROT 20.3*  --  18.6*  --   --   INR 1.79*  --  1.60*  --   --   HEPARINUNFRC 0.41  --  0.23* 0.43 0.40  CREATININE 0.67  --   --   --  0.69    Lab Results  Component Value Date   INR 1.60* 05/10/2013   INR 1.79* 05/09/2013   INR 1.82* 05/08/2013        HEPARINUNFRC 0.40 05/11/2013   HEPARINUNFRC 0.43 05/10/2013   HEPARINUNFRC 0.23* 05/10/2013        HGB 13.6 05/11/2013   HGB 13.8 05/10/2013   HGB 14.5 05/09/2013    Current Medication[s] Include:  Infusion[s]:  . sodium chloride 250 mL (05/09/13 1045)  . heparin 1,550 Units/hr (05/10/13 2113)   Assessment :  Heparin level is 0.4 units/ml.  No bleeding complications observed.  Goal :  Heparin goal is Heparin level 0.3-0.7 units/ml.  Plan : 1. Heparin levels remain therapeutic and stable on 1550 units/hr 2. No bleeding complications noted 3. Scheduled AVR/Maze procedure in the AM. 4. Will follow Daily Heparin level, CBC if surgery delayed or postponed while on Heparin.  Monitor for bleeding complications.   Mihail Prettyman, Craig Guess, Pharm.D. 05/11/2013  1:21 PM

## 2013-05-12 ENCOUNTER — Encounter (HOSPITAL_COMMUNITY)
Admission: EM | Disposition: A | Discharge status: Home Health | Payer: Medicare Other | Source: Home / Self Care | Attending: Cardiothoracic Surgery

## 2013-05-12 ENCOUNTER — Inpatient Hospital Stay (HOSPITAL_COMMUNITY): Payer: Medicare Other

## 2013-05-12 ENCOUNTER — Inpatient Hospital Stay (HOSPITAL_COMMUNITY): Payer: Medicare Other | Admitting: Certified Registered"

## 2013-05-12 ENCOUNTER — Encounter (HOSPITAL_COMMUNITY): Payer: Self-pay | Admitting: Certified Registered"

## 2013-05-12 ENCOUNTER — Encounter (HOSPITAL_COMMUNITY): Payer: Medicare Other | Admitting: Certified Registered"

## 2013-05-12 DIAGNOSIS — Z952 Presence of prosthetic heart valve: Secondary | ICD-10-CM

## 2013-05-12 DIAGNOSIS — I359 Nonrheumatic aortic valve disorder, unspecified: Secondary | ICD-10-CM

## 2013-05-12 DIAGNOSIS — I2510 Atherosclerotic heart disease of native coronary artery without angina pectoris: Secondary | ICD-10-CM

## 2013-05-12 DIAGNOSIS — I4891 Unspecified atrial fibrillation: Secondary | ICD-10-CM

## 2013-05-12 HISTORY — PX: INTRAOPERATIVE TRANSESOPHAGEAL ECHOCARDIOGRAM: SHX5062

## 2013-05-12 HISTORY — PX: AORTIC VALVE REPLACEMENT: SHX41

## 2013-05-12 HISTORY — PX: MAZE: SHX5063

## 2013-05-12 HISTORY — PX: CORONARY ARTERY BYPASS GRAFT: SHX141

## 2013-05-12 LAB — POCT I-STAT, CHEM 8
BUN: 8 mg/dL (ref 6–23)
Calcium, Ion: 1.25 mmol/L (ref 1.13–1.30)
Chloride: 104 mEq/L (ref 96–112)
Creatinine, Ser: 0.7 mg/dL (ref 0.50–1.35)
Glucose, Bld: 129 mg/dL — ABNORMAL HIGH (ref 70–99)
HCT: 34 % — ABNORMAL LOW (ref 39.0–52.0)
Hemoglobin: 11.6 g/dL — ABNORMAL LOW (ref 13.0–17.0)
Potassium: 4.3 mEq/L (ref 3.7–5.3)
Sodium: 140 mEq/L (ref 137–147)
TCO2: 21 mmol/L (ref 0–100)

## 2013-05-12 LAB — HEMOGLOBIN AND HEMATOCRIT, BLOOD
HCT: 24.8 % — ABNORMAL LOW (ref 39.0–52.0)
Hemoglobin: 8.8 g/dL — ABNORMAL LOW (ref 13.0–17.0)

## 2013-05-12 LAB — POCT I-STAT 3, ART BLOOD GAS (G3+)
Acid-base deficit: 1 mmol/L (ref 0.0–2.0)
Acid-base deficit: 1 mmol/L (ref 0.0–2.0)
Acid-base deficit: 2 mmol/L (ref 0.0–2.0)
Acid-base deficit: 3 mmol/L — ABNORMAL HIGH (ref 0.0–2.0)
Acid-base deficit: 3 mmol/L — ABNORMAL HIGH (ref 0.0–2.0)
Bicarbonate: 23.9 mEq/L (ref 20.0–24.0)
Bicarbonate: 22.9 mEq/L (ref 20.0–24.0)
Bicarbonate: 23.1 mEq/L (ref 20.0–24.0)
Bicarbonate: 21.6 mEq/L (ref 20.0–24.0)
Bicarbonate: 23.1 mEq/L (ref 20.0–24.0)
O2 Saturation: 100 %
O2 Saturation: 100 %
O2 Saturation: 96 %
O2 Saturation: 97 %
O2 Saturation: 93 %
Patient temperature: 36.4
Patient temperature: 37.8
Patient temperature: 37.8
TCO2: 25 mmol/L (ref 0–100)
TCO2: 24 mmol/L (ref 0–100)
TCO2: 24 mmol/L (ref 0–100)
TCO2: 23 mmol/L (ref 0–100)
TCO2: 24 mmol/L (ref 0–100)
pCO2 arterial: 37.7 mmHg (ref 35.0–45.0)
pCO2 arterial: 36.3 mmHg (ref 35.0–45.0)
pCO2 arterial: 37.6 mmHg (ref 35.0–45.0)
pCO2 arterial: 38.2 mmHg (ref 35.0–45.0)
pCO2 arterial: 43.7 mmHg (ref 35.0–45.0)
pH, Arterial: 7.41 (ref 7.350–7.450)
pH, Arterial: 7.408 (ref 7.350–7.450)
pH, Arterial: 7.394 (ref 7.350–7.450)
pH, Arterial: 7.364 (ref 7.350–7.450)
pH, Arterial: 7.335 — ABNORMAL LOW (ref 7.350–7.450)
pO2, Arterial: 357 mmHg — ABNORMAL HIGH (ref 80.0–100.0)
pO2, Arterial: 173 mmHg — ABNORMAL HIGH (ref 80.0–100.0)
pO2, Arterial: 79 mmHg — ABNORMAL LOW (ref 80.0–100.0)
pO2, Arterial: 96 mmHg (ref 80.0–100.0)
pO2, Arterial: 75 mmHg — ABNORMAL LOW (ref 80.0–100.0)

## 2013-05-12 LAB — POCT I-STAT 4, (NA,K, GLUC, HGB,HCT)
Glucose, Bld: 105 mg/dL — ABNORMAL HIGH (ref 70–99)
Glucose, Bld: 108 mg/dL — ABNORMAL HIGH (ref 70–99)
Glucose, Bld: 98 mg/dL (ref 70–99)
Glucose, Bld: 119 mg/dL — ABNORMAL HIGH (ref 70–99)
Glucose, Bld: 147 mg/dL — ABNORMAL HIGH (ref 70–99)
Glucose, Bld: 130 mg/dL — ABNORMAL HIGH (ref 70–99)
Glucose, Bld: 122 mg/dL — ABNORMAL HIGH (ref 70–99)
HCT: 38 % — ABNORMAL LOW (ref 39.0–52.0)
HCT: 27 % — ABNORMAL LOW (ref 39.0–52.0)
HCT: 38 % — ABNORMAL LOW (ref 39.0–52.0)
HCT: 25 % — ABNORMAL LOW (ref 39.0–52.0)
HCT: 23 % — ABNORMAL LOW (ref 39.0–52.0)
HCT: 25 % — ABNORMAL LOW (ref 39.0–52.0)
HCT: 32 % — ABNORMAL LOW (ref 39.0–52.0)
Hemoglobin: 12.9 g/dL — ABNORMAL LOW (ref 13.0–17.0)
Hemoglobin: 12.9 g/dL — ABNORMAL LOW (ref 13.0–17.0)
Hemoglobin: 9.2 g/dL — ABNORMAL LOW (ref 13.0–17.0)
Hemoglobin: 8.5 g/dL — ABNORMAL LOW (ref 13.0–17.0)
Hemoglobin: 7.8 g/dL — ABNORMAL LOW (ref 13.0–17.0)
Hemoglobin: 8.5 g/dL — ABNORMAL LOW (ref 13.0–17.0)
Hemoglobin: 10.9 g/dL — ABNORMAL LOW (ref 13.0–17.0)
Potassium: 4 mEq/L (ref 3.7–5.3)
Potassium: 3.4 mEq/L — ABNORMAL LOW (ref 3.7–5.3)
Potassium: 4 mEq/L (ref 3.7–5.3)
Potassium: 4 mEq/L (ref 3.7–5.3)
Potassium: 3.7 mEq/L (ref 3.7–5.3)
Potassium: 4 mEq/L (ref 3.7–5.3)
Potassium: 3.6 mEq/L — ABNORMAL LOW (ref 3.7–5.3)
Sodium: 139 mEq/L (ref 137–147)
Sodium: 137 mEq/L (ref 137–147)
Sodium: 138 mEq/L (ref 137–147)
Sodium: 135 mEq/L — ABNORMAL LOW (ref 137–147)
Sodium: 140 mEq/L (ref 137–147)
Sodium: 140 mEq/L (ref 137–147)
Sodium: 139 mEq/L (ref 137–147)

## 2013-05-12 LAB — GLUCOSE, CAPILLARY
Glucose-Capillary: 120 mg/dL — ABNORMAL HIGH (ref 70–99)
Glucose-Capillary: 120 mg/dL — ABNORMAL HIGH (ref 70–99)
Glucose-Capillary: 131 mg/dL — ABNORMAL HIGH (ref 70–99)
Glucose-Capillary: 118 mg/dL — ABNORMAL HIGH (ref 70–99)
Glucose-Capillary: 123 mg/dL — ABNORMAL HIGH (ref 70–99)
Glucose-Capillary: 123 mg/dL — ABNORMAL HIGH (ref 70–99)
Glucose-Capillary: 160 mg/dL — ABNORMAL HIGH (ref 70–99)
Glucose-Capillary: 137 mg/dL — ABNORMAL HIGH (ref 70–99)

## 2013-05-12 LAB — CBC
HCT: 40 % (ref 39.0–52.0)
HCT: 33.5 % — ABNORMAL LOW (ref 39.0–52.0)
HCT: 33.1 % — ABNORMAL LOW (ref 39.0–52.0)
Hemoglobin: 13.6 g/dL (ref 13.0–17.0)
Hemoglobin: 11.7 g/dL — ABNORMAL LOW (ref 13.0–17.0)
Hemoglobin: 11.5 g/dL — ABNORMAL LOW (ref 13.0–17.0)
MCH: 33.3 pg (ref 26.0–34.0)
MCH: 33.6 pg (ref 26.0–34.0)
MCH: 33.3 pg (ref 26.0–34.0)
MCHC: 34 g/dL (ref 30.0–36.0)
MCHC: 34.9 g/dL (ref 30.0–36.0)
MCHC: 34.7 g/dL (ref 30.0–36.0)
MCV: 98 fL (ref 78.0–100.0)
MCV: 96.3 fL (ref 78.0–100.0)
MCV: 95.9 fL (ref 78.0–100.0)
Platelets: 179 10*3/uL (ref 150–400)
Platelets: 90 10*3/uL — ABNORMAL LOW (ref 150–400)
Platelets: 128 10*3/uL — ABNORMAL LOW (ref 150–400)
RBC: 4.08 MIL/uL — ABNORMAL LOW (ref 4.22–5.81)
RBC: 3.48 MIL/uL — ABNORMAL LOW (ref 4.22–5.81)
RBC: 3.45 MIL/uL — ABNORMAL LOW (ref 4.22–5.81)
RDW: 14.7 % (ref 11.5–15.5)
RDW: 14.3 % (ref 11.5–15.5)
RDW: 14.3 % (ref 11.5–15.5)
WBC: 6.2 10*3/uL (ref 4.0–10.5)
WBC: 9.3 10*3/uL (ref 4.0–10.5)
WBC: 13 10*3/uL — ABNORMAL HIGH (ref 4.0–10.5)

## 2013-05-12 LAB — BASIC METABOLIC PANEL
BUN: 14 mg/dL (ref 6–23)
CO2: 22 mEq/L (ref 19–32)
Calcium: 9.3 mg/dL (ref 8.4–10.5)
Chloride: 100 mEq/L (ref 96–112)
Creatinine, Ser: 0.73 mg/dL (ref 0.50–1.35)
GFR calc Af Amer: >90 mL/min (ref >90)
GFR calc non Af Amer: 83 mL/min — ABNORMAL LOW (ref >90)
Glucose, Bld: 100 mg/dL — ABNORMAL HIGH (ref 70–99)
Potassium: 4.4 mEq/L (ref 3.7–5.3)
Sodium: 138 mEq/L (ref 137–147)

## 2013-05-12 LAB — APTT: aPTT: 35 seconds (ref 24–37)

## 2013-05-12 LAB — MAGNESIUM: Magnesium: 2.9 mg/dL — ABNORMAL HIGH (ref 1.5–2.5)

## 2013-05-12 LAB — POCT I-STAT GLUCOSE
Glucose, Bld: 152 mg/dL — ABNORMAL HIGH (ref 70–99)
Operator id: 3402

## 2013-05-12 LAB — CREATININE, SERUM
Creatinine, Ser: 0.74 mg/dL (ref 0.50–1.35)
GFR calc Af Amer: >90 mL/min (ref >90)
GFR calc non Af Amer: 83 mL/min — ABNORMAL LOW (ref >90)

## 2013-05-12 LAB — HEPARIN LEVEL (UNFRACTIONATED): Heparin Unfractionated: 0.56 IU/mL (ref 0.30–0.70)

## 2013-05-12 LAB — PROTIME-INR
INR: 1.6 — ABNORMAL HIGH (ref 0.00–1.49)
Prothrombin Time: 18.6 seconds — ABNORMAL HIGH (ref 11.6–15.2)

## 2013-05-12 LAB — PLATELET COUNT: Platelets: 123 10*3/uL — ABNORMAL LOW (ref 150–400)

## 2013-05-12 SURGERY — CORONARY ARTERY BYPASS GRAFTING (CABG)
Anesthesia: General | Site: Chest

## 2013-05-12 MED ORDER — SODIUM CHLORIDE 0.9 % IV SOLN
INTRAVENOUS | Status: DC
Start: 2013-05-12 — End: 2013-05-16
  Administered 2013-05-12: 20 mL via INTRAVENOUS

## 2013-05-12 MED ORDER — VECURONIUM BROMIDE 10 MG IV SOLR
INTRAVENOUS | Status: AC
  Filled 2013-05-12: qty 10

## 2013-05-12 MED ORDER — SODIUM CHLORIDE 0.45 % IV SOLN
INTRAVENOUS | Status: DC
  Administered 2013-05-12: 20 mL/h via INTRAVENOUS

## 2013-05-12 MED ORDER — POTASSIUM CHLORIDE 10 MEQ/50ML IV SOLN
10.0000 meq | INTRAVENOUS | Status: AC
  Administered 2013-05-12 (×3): 10 meq via INTRAVENOUS

## 2013-05-12 MED ORDER — BISACODYL 10 MG RE SUPP: 10.0000 mg | Freq: Every day | RECTAL | Status: DC

## 2013-05-12 MED ORDER — PHENYLEPHRINE HCL 10 MG/ML IJ SOLN
INTRAMUSCULAR | Status: AC
  Filled 2013-05-12: qty 1

## 2013-05-12 MED ORDER — INSULIN REGULAR BOLUS VIA INFUSION
0.0000 [IU] | Freq: Three times a day (TID) | INTRAVENOUS | Status: DC
Start: 2013-05-12 — End: 2013-05-13
  Filled 2013-05-12: qty 10

## 2013-05-12 MED ORDER — DEXTROSE 5 % IV SOLN
0.0000 ug/min | INTRAVENOUS | Status: DC
  Administered 2013-05-12: 60 ug/min via INTRAVENOUS
  Filled 2013-05-12 (×3): qty 2

## 2013-05-12 MED ORDER — VECURONIUM BROMIDE 10 MG IV SOLR
INTRAVENOUS | Status: DC | PRN
  Administered 2013-05-12 (×2): 3 mg via INTRAVENOUS
  Administered 2013-05-12: 10 mg via INTRAVENOUS

## 2013-05-12 MED ORDER — SODIUM CHLORIDE 0.9 % IJ SOLN
OROMUCOSAL | Status: DC | PRN
  Administered 2013-05-12 (×3): via TOPICAL

## 2013-05-12 MED ORDER — PANTOPRAZOLE SODIUM 40 MG PO TBEC
40.0000 mg | DELAYED_RELEASE_TABLET | Freq: Every day | ORAL | Status: DC
  Administered 2013-05-14 – 2013-05-18 (×5): 40 mg via ORAL
  Filled 2013-05-12 (×5): qty 1

## 2013-05-12 MED ORDER — MAGNESIUM SULFATE 4000MG/100ML IJ SOLN
4.0000 g | Freq: Once | INTRAMUSCULAR | Status: AC
  Administered 2013-05-12: 4 g via INTRAVENOUS
  Filled 2013-05-12: qty 100

## 2013-05-12 MED ORDER — MILRINONE IN DEXTROSE 20 MG/100ML IV SOLN
0.3000 ug/kg/min | INTRAVENOUS | Status: DC
  Administered 2013-05-12: 0.3 ug/kg/min via INTRAVENOUS
  Filled 2013-05-12: qty 100

## 2013-05-12 MED ORDER — ACETAMINOPHEN 500 MG PO TABS
1000.0000 mg | ORAL_TABLET | Freq: Four times a day (QID) | ORAL | Status: AC
  Administered 2013-05-13 – 2013-05-15 (×9): 1000 mg via ORAL
  Filled 2013-05-12 (×20): qty 2

## 2013-05-12 MED ORDER — AMIODARONE HCL IN DEXTROSE 360-4.14 MG/200ML-% IV SOLN
30.0000 mg/h | INTRAVENOUS | Status: DC
  Administered 2013-05-12: 30 mg/h via INTRAVENOUS
  Filled 2013-05-12 (×3): qty 200

## 2013-05-12 MED ORDER — AMIODARONE HCL IN DEXTROSE 360-4.14 MG/200ML-% IV SOLN
30.0000 mg/h | INTRAVENOUS | Status: AC
  Administered 2013-05-12: 30 mg/h via INTRAVENOUS

## 2013-05-12 MED ORDER — METOCLOPRAMIDE HCL 5 MG/ML IJ SOLN
10.0000 mg | Freq: Four times a day (QID) | INTRAMUSCULAR | Status: AC
  Administered 2013-05-12 – 2013-05-13 (×3): 10 mg via INTRAVENOUS
  Filled 2013-05-12 (×3): qty 2

## 2013-05-12 MED ORDER — SODIUM CHLORIDE 0.9 % IV SOLN: 250.0000 mL | INTRAVENOUS | Status: DC

## 2013-05-12 MED ORDER — CALCIUM CHLORIDE 10 % IV SOLN
INTRAVENOUS | Status: DC | PRN
  Administered 2013-05-12: 200 mg via INTRAVENOUS

## 2013-05-12 MED ORDER — ASPIRIN 81 MG PO CHEW: 324.0000 mg | CHEWABLE_TABLET | Freq: Every day | ORAL | Status: DC

## 2013-05-12 MED ORDER — METOPROLOL TARTRATE 25 MG/10 ML ORAL SUSPENSION
12.5000 mg | Freq: Two times a day (BID) | ORAL | Status: DC
  Filled 2013-05-12 (×7): qty 5

## 2013-05-12 MED ORDER — ACETAMINOPHEN 160 MG/5ML PO SOLN
1000.0000 mg | Freq: Four times a day (QID) | ORAL | Status: DC
  Filled 2013-05-12: qty 40

## 2013-05-12 MED ORDER — MIDAZOLAM HCL 10 MG/2ML IJ SOLN
INTRAMUSCULAR | Status: AC
  Filled 2013-05-12: qty 2

## 2013-05-12 MED ORDER — LACTATED RINGERS IV SOLN
INTRAVENOUS | Status: DC | PRN
  Administered 2013-05-12: 07:00:00 via INTRAVENOUS

## 2013-05-12 MED ORDER — SUFENTANIL CITRATE 250 MCG/5ML IV SOLN
INTRAVENOUS | Status: AC
  Filled 2013-05-12: qty 5

## 2013-05-12 MED ORDER — AMIODARONE HCL IN DEXTROSE 360-4.14 MG/200ML-% IV SOLN
30.0000 mg/h | INTRAVENOUS | Status: DC
  Administered 2013-05-13 – 2013-05-14 (×2): 30 mg/h via INTRAVENOUS
  Filled 2013-05-12 (×8): qty 200

## 2013-05-12 MED ORDER — DOCUSATE SODIUM 100 MG PO CAPS
200.0000 mg | ORAL_CAPSULE | Freq: Every day | ORAL | Status: DC
  Administered 2013-05-13 – 2013-05-18 (×6): 200 mg via ORAL
  Filled 2013-05-12 (×6): qty 2

## 2013-05-12 MED ORDER — SODIUM CHLORIDE 0.9 % IJ SOLN
3.0000 mL | Freq: Two times a day (BID) | INTRAMUSCULAR | Status: DC
  Administered 2013-05-13 – 2013-05-15 (×5): 3 mL via INTRAVENOUS

## 2013-05-12 MED ORDER — OXYCODONE HCL 5 MG PO TABS: 5.0000 mg | ORAL_TABLET | ORAL | Status: DC | PRN

## 2013-05-12 MED ORDER — METOPROLOL TARTRATE 1 MG/ML IV SOLN: 2.5000 mg | INTRAVENOUS | Status: DC | PRN

## 2013-05-12 MED ORDER — DEXTROSE 5 % IV SOLN
1.5000 g | Freq: Two times a day (BID) | INTRAVENOUS | Status: AC
  Administered 2013-05-12 – 2013-05-14 (×4): 1.5 g via INTRAVENOUS
  Filled 2013-05-12 (×4): qty 1.5

## 2013-05-12 MED ORDER — MORPHINE SULFATE 2 MG/ML IJ SOLN
1.0000 mg | INTRAMUSCULAR | Status: AC | PRN
  Administered 2013-05-12: 2 mg via INTRAVENOUS
  Filled 2013-05-12: qty 1

## 2013-05-12 MED ORDER — DEXMEDETOMIDINE HCL IN NACL 200 MCG/50ML IV SOLN
0.1000 ug/kg/h | INTRAVENOUS | Status: DC
  Administered 2013-05-12: 0.7 ug/kg/h via INTRAVENOUS
  Filled 2013-05-12: qty 50

## 2013-05-12 MED ORDER — ALBUMIN HUMAN 5 % IV SOLN
250.0000 mL | INTRAVENOUS | Status: AC | PRN
  Administered 2013-05-12 (×3): 250 mL via INTRAVENOUS
  Filled 2013-05-12: qty 250

## 2013-05-12 MED ORDER — FAMOTIDINE IN NACL 20-0.9 MG/50ML-% IV SOLN
20.0000 mg | Freq: Two times a day (BID) | INTRAVENOUS | Status: AC
  Administered 2013-05-12: 20 mg via INTRAVENOUS

## 2013-05-12 MED ORDER — ARTIFICIAL TEARS OP OINT
TOPICAL_OINTMENT | OPHTHALMIC | Status: AC
  Filled 2013-05-12: qty 3.5

## 2013-05-12 MED ORDER — LACTATED RINGERS IV SOLN
INTRAVENOUS | Status: DC
  Administered 2013-05-12: 20 mL/h via INTRAVENOUS

## 2013-05-12 MED ORDER — ASPIRIN EC 325 MG PO TBEC
325.0000 mg | DELAYED_RELEASE_TABLET | Freq: Every day | ORAL | Status: DC
  Administered 2013-05-14: 325 mg via ORAL
  Filled 2013-05-12 (×2): qty 1

## 2013-05-12 MED ORDER — ONDANSETRON HCL 4 MG/2ML IJ SOLN
4.0000 mg | Freq: Four times a day (QID) | INTRAMUSCULAR | Status: DC | PRN
  Administered 2013-05-14: 4 mg via INTRAVENOUS
  Filled 2013-05-12: qty 2

## 2013-05-12 MED ORDER — MUPIROCIN 2 % EX OINT
TOPICAL_OINTMENT | Freq: Two times a day (BID) | CUTANEOUS | Status: DC
  Administered 2013-05-12 – 2013-05-14 (×4): via NASAL
  Administered 2013-05-14: 1 via NASAL
  Administered 2013-05-15 – 2013-05-17 (×6): via NASAL
  Filled 2013-05-12: qty 22

## 2013-05-12 MED ORDER — LIDOCAINE HCL (CARDIAC) 20 MG/ML IV SOLN
INTRAVENOUS | Status: AC
  Filled 2013-05-12: qty 5

## 2013-05-12 MED ORDER — VANCOMYCIN HCL IN DEXTROSE 1-5 GM/200ML-% IV SOLN
1000.0000 mg | Freq: Once | INTRAVENOUS | Status: DC
  Filled 2013-05-12: qty 200

## 2013-05-12 MED ORDER — AMIODARONE HCL IN DEXTROSE 360-4.14 MG/200ML-% IV SOLN
60.0000 mg/h | INTRAVENOUS | Status: DC
  Filled 2013-05-12 (×2): qty 200

## 2013-05-12 MED ORDER — SODIUM CHLORIDE 0.9 % IV SOLN
INTRAVENOUS | Status: DC
  Administered 2013-05-12: 3.5 [IU]/h via INTRAVENOUS
  Administered 2013-05-12: 2.9 [IU]/h via INTRAVENOUS
  Filled 2013-05-12 (×2): qty 1

## 2013-05-12 MED ORDER — VANCOMYCIN HCL IN DEXTROSE 1-5 GM/200ML-% IV SOLN
1000.0000 mg | Freq: Two times a day (BID) | INTRAVENOUS | Status: AC
  Administered 2013-05-12 – 2013-05-13 (×2): 1000 mg via INTRAVENOUS
  Filled 2013-05-12 (×2): qty 200

## 2013-05-12 MED ORDER — HEPARIN SODIUM (PORCINE) 1000 UNIT/ML IJ SOLN
INTRAMUSCULAR | Status: DC | PRN
  Administered 2013-05-12: 33000 [IU] via INTRAVENOUS

## 2013-05-12 MED ORDER — MILRINONE IN DEXTROSE 20 MG/100ML IV SOLN
0.1250 ug/kg/min | INTRAVENOUS | Status: DC
  Filled 2013-05-12: qty 100

## 2013-05-12 MED ORDER — SODIUM CHLORIDE 0.9 % IJ SOLN
3.0000 mL | INTRAMUSCULAR | Status: DC | PRN
  Administered 2013-05-15: 3 mL via INTRAVENOUS

## 2013-05-12 MED ORDER — MIDAZOLAM HCL 2 MG/2ML IJ SOLN: 2.0000 mg | INTRAMUSCULAR | Status: DC | PRN

## 2013-05-12 MED ORDER — MIDAZOLAM HCL 5 MG/5ML IJ SOLN
INTRAMUSCULAR | Status: DC | PRN
  Administered 2013-05-12: 3 mg via INTRAVENOUS
  Administered 2013-05-12: 4 mg via INTRAVENOUS
  Administered 2013-05-12: 3 mg via INTRAVENOUS
  Administered 2013-05-12: 2 mg via INTRAVENOUS

## 2013-05-12 MED ORDER — HEMOSTATIC AGENTS (NO CHARGE) OPTIME
TOPICAL | Status: DC | PRN
  Administered 2013-05-12: 1 via TOPICAL

## 2013-05-12 MED ORDER — PHENYLEPHRINE HCL 10 MG/ML IJ SOLN
INTRAMUSCULAR | Status: DC | PRN
  Administered 2013-05-12: 80 ug via INTRAVENOUS
  Administered 2013-05-12: 40 ug via INTRAVENOUS
  Administered 2013-05-12: 80 ug via INTRAVENOUS

## 2013-05-12 MED ORDER — PROPOFOL 10 MG/ML IV BOLUS
INTRAVENOUS | Status: DC | PRN
  Administered 2013-05-12: 50 mg via INTRAVENOUS

## 2013-05-12 MED ORDER — METOPROLOL TARTRATE 12.5 MG HALF TABLET
12.5000 mg | ORAL_TABLET | Freq: Two times a day (BID) | ORAL | Status: DC
Start: 2013-05-12 — End: 2013-05-15
  Administered 2013-05-13 – 2013-05-14 (×4): 12.5 mg via ORAL
  Filled 2013-05-12 (×5): qty 1

## 2013-05-12 MED ORDER — PROPOFOL 10 MG/ML IV BOLUS
INTRAVENOUS | Status: AC
  Filled 2013-05-12: qty 20

## 2013-05-12 MED ORDER — DOPAMINE-DEXTROSE 3.2-5 MG/ML-% IV SOLN
2.5000 ug/kg/min | INTRAVENOUS | Status: DC
  Administered 2013-05-12: 3 ug/kg/min via INTRAVENOUS

## 2013-05-12 MED ORDER — ARTIFICIAL TEARS OP OINT
TOPICAL_OINTMENT | OPHTHALMIC | Status: DC | PRN
  Administered 2013-05-12: 1 via OPHTHALMIC

## 2013-05-12 MED ORDER — MORPHINE SULFATE 2 MG/ML IJ SOLN
2.0000 mg | INTRAMUSCULAR | Status: DC | PRN
  Administered 2013-05-12 – 2013-05-13 (×4): 2 mg via INTRAVENOUS
  Filled 2013-05-12 (×4): qty 1

## 2013-05-12 MED ORDER — BISACODYL 5 MG PO TBEC
10.0000 mg | DELAYED_RELEASE_TABLET | Freq: Every day | ORAL | Status: DC
  Administered 2013-05-13 – 2013-05-14 (×2): 10 mg via ORAL
  Filled 2013-05-12 (×2): qty 2

## 2013-05-12 MED ORDER — PROTAMINE SULFATE 10 MG/ML IV SOLN
INTRAVENOUS | Status: DC | PRN
  Administered 2013-05-12 (×2): 150 mg via INTRAVENOUS

## 2013-05-12 MED ORDER — AMIODARONE HCL IN DEXTROSE 360-4.14 MG/200ML-% IV SOLN
60.0000 mg/h | INTRAVENOUS | Status: DC
  Filled 2013-05-12: qty 200

## 2013-05-12 MED ORDER — ACETAMINOPHEN 650 MG RE SUPP
650.0000 mg | Freq: Once | RECTAL | Status: AC
  Administered 2013-05-12: 650 mg via RECTAL

## 2013-05-12 MED ORDER — LACTATED RINGERS IV SOLN
INTRAVENOUS | Status: DC | PRN
  Administered 2013-05-12 (×2): via INTRAVENOUS

## 2013-05-12 MED ORDER — NITROGLYCERIN IN D5W 200-5 MCG/ML-% IV SOLN: 0.0000 ug/min | INTRAVENOUS | Status: DC

## 2013-05-12 MED ORDER — SUFENTANIL CITRATE 50 MCG/ML IV SOLN
INTRAVENOUS | Status: DC | PRN
  Administered 2013-05-12 (×2): 20 ug via INTRAVENOUS
  Administered 2013-05-12: 40 ug via INTRAVENOUS
  Administered 2013-05-12: 30 ug via INTRAVENOUS
  Administered 2013-05-12 (×2): 20 ug via INTRAVENOUS

## 2013-05-12 MED ORDER — 0.9 % SODIUM CHLORIDE (POUR BTL) OPTIME
TOPICAL | Status: DC | PRN
  Administered 2013-05-12: 2000 mL
  Administered 2013-05-12: 6000 mL

## 2013-05-12 MED ORDER — POTASSIUM CHLORIDE 10 MEQ/50ML IV SOLN
10.0000 meq | Freq: Once | INTRAVENOUS | Status: AC
  Administered 2013-05-12: 10 meq via INTRAVENOUS

## 2013-05-12 MED ORDER — ACETAMINOPHEN 160 MG/5ML PO SOLN: 650.0000 mg | Freq: Once | ORAL | Status: AC

## 2013-05-12 MED ORDER — LACTATED RINGERS IV SOLN: 500.0000 mL | Freq: Once | INTRAVENOUS | Status: DC | PRN

## 2013-05-12 MED ORDER — SODIUM CHLORIDE 0.9 % IV SOLN
0.5000 g/h | INTRAVENOUS | Status: DC
  Filled 2013-05-12: qty 20

## 2013-05-12 SURGICAL SUPPLY — 138 items
ADAPTER CARDIO PERF ANTE/RETRO (ADAPTER) ×4 IMPLANT
ATTRACTOMAT 16X20 MAGNETIC DRP (DRAPES) ×4 IMPLANT
BAG DECANTER FOR FLEXI CONT (MISCELLANEOUS) ×8 IMPLANT
BANDAGE ELASTIC 4 VELCRO ST LF (GAUZE/BANDAGES/DRESSINGS) ×4 IMPLANT
BANDAGE ELASTIC 6 VELCRO ST LF (GAUZE/BANDAGES/DRESSINGS) ×4 IMPLANT
BANDAGE GAUZE ELAST BULKY 4 IN (GAUZE/BANDAGES/DRESSINGS) ×4 IMPLANT
BASKET HEART  (ORDER IN 25'S) (MISCELLANEOUS) ×1
BASKET HEART (ORDER IN 25'S) (MISCELLANEOUS) ×1
BASKET HEART (ORDER IN 25S) (MISCELLANEOUS) ×2 IMPLANT
BENZOIN TINCTURE PRP APPL 2/3 (GAUZE/BANDAGES/DRESSINGS) ×4 IMPLANT
BLADE 11 SAFETY STRL DISP (BLADE) ×4 IMPLANT
BLADE STERNUM SYSTEM 6 (BLADE) ×4 IMPLANT
BLADE SURG 12 STRL SS (BLADE) ×4 IMPLANT
BLADE SURG 15 STRL LF DISP TIS (BLADE) ×4 IMPLANT
BLADE SURG 15 STRL SS (BLADE) ×4
BLADE SURG ROTATE 9660 (MISCELLANEOUS) IMPLANT
CANISTER SUCTION 2500CC (MISCELLANEOUS) ×4 IMPLANT
CANN PRFSN 3/8XRT ANG TPR 14 (MISCELLANEOUS) ×2
CANNULA GUNDRY RCSP 15FR (MISCELLANEOUS) ×8 IMPLANT
CANNULA PRFSN 3/8XRT ANG TPR14 (MISCELLANEOUS) ×2 IMPLANT
CANNULA VEN MTL TIP RT (MISCELLANEOUS) ×2
CANNULA VENOUS LOW PROF 32X40 (CANNULA) IMPLANT
CANNULA VRC MALB SNGL STG 28FR (MISCELLANEOUS) ×2 IMPLANT
CARDIAC SUCTION (MISCELLANEOUS) ×4 IMPLANT
CARDIOBLATE CARDIAC ABLATION (MISCELLANEOUS) ×4
CATH CPB KIT VANTRIGT (MISCELLANEOUS) ×4 IMPLANT
CATH HEART VENT LEFT (CATHETERS) ×2 IMPLANT
CATH RETROPLEGIA CORONARY 14FR (CATHETERS) IMPLANT
CATH ROBINSON RED A/P 18FR (CATHETERS) ×16 IMPLANT
CATH THORACIC 36FR RT ANG (CATHETERS) ×4 IMPLANT
CLIP FOGARTY SPRING 6M (CLIP) IMPLANT
CLIP TI WIDE RED SMALL 24 (CLIP) IMPLANT
CLOSURE STERI-STRIP 1/4X4 (GAUZE/BANDAGES/DRESSINGS) ×4 IMPLANT
CONN 3/8X1/2 ST GISH (MISCELLANEOUS) ×8 IMPLANT
CONT SPEC 4OZ CLIKSEAL STRL BL (MISCELLANEOUS) ×4 IMPLANT
COVER SURGICAL LIGHT HANDLE (MISCELLANEOUS) ×8 IMPLANT
CRADLE DONUT ADULT HEAD (MISCELLANEOUS) ×4 IMPLANT
DEVICE CARDIOBLATE CARDIAC ABL (MISCELLANEOUS) ×2 IMPLANT
DRAIN CHANNEL 32F RND 10.7 FF (WOUND CARE) ×4 IMPLANT
DRAPE CARDIOVASCULAR INCISE (DRAPES) ×2
DRAPE INCISE IOBAN 66X45 STRL (DRAPES) ×4 IMPLANT
DRAPE SLUSH/WARMER DISC (DRAPES) ×4 IMPLANT
DRAPE SRG 135X102X78XABS (DRAPES) ×2 IMPLANT
DRSG AQUACEL AG ADV 3.5X14 (GAUZE/BANDAGES/DRESSINGS) ×4 IMPLANT
ELECT BLADE 4.0 EZ CLEAN MEGAD (MISCELLANEOUS) ×4
ELECT BLADE 6.5 EXT (BLADE) ×4 IMPLANT
ELECT CAUTERY BLADE 6.4 (BLADE) ×4 IMPLANT
ELECT REM PT RETURN 9FT ADLT (ELECTROSURGICAL) ×8
ELECTRODE BLDE 4.0 EZ CLN MEGD (MISCELLANEOUS) ×2 IMPLANT
ELECTRODE REM PT RTRN 9FT ADLT (ELECTROSURGICAL) ×4 IMPLANT
GEL ULTRASOUND 20GR AQUASONIC (MISCELLANEOUS) ×8 IMPLANT
GLOVE BIO SURGEON STRL SZ 6 (GLOVE) ×8 IMPLANT
GLOVE BIO SURGEON STRL SZ7 (GLOVE) ×8 IMPLANT
GLOVE BIO SURGEON STRL SZ7.5 (GLOVE) ×12 IMPLANT
GLOVE BIOGEL PI IND STRL 6 (GLOVE) ×6 IMPLANT
GLOVE BIOGEL PI INDICATOR 6 (GLOVE) ×6
GOWN STRL REUS W/ TWL LRG LVL3 (GOWN DISPOSABLE) ×8 IMPLANT
GOWN STRL REUS W/TWL LRG LVL3 (GOWN DISPOSABLE) ×8
HEMOSTAT POWDER SURGIFOAM 1G (HEMOSTASIS) ×12 IMPLANT
HEMOSTAT SURGICEL 2X14 (HEMOSTASIS) ×4 IMPLANT
INSERT FOGARTY XLG (MISCELLANEOUS) ×4 IMPLANT
IV NS 1000ML (IV SOLUTION) ×2
IV NS 1000ML BAXH (IV SOLUTION) ×2 IMPLANT
KIT BASIN OR (CUSTOM PROCEDURE TRAY) ×4 IMPLANT
KIT ROOM TURNOVER OR (KITS) ×4 IMPLANT
KIT SUCTION CATH 14FR (SUCTIONS) ×4 IMPLANT
KIT VASOVIEW W/TROCAR VH 2000 (KITS) ×4 IMPLANT
LEAD PACING MYOCARDI (MISCELLANEOUS) ×4 IMPLANT
LINE VENT (MISCELLANEOUS) ×8 IMPLANT
LOOP VESSEL SUPERMAXI WHITE (MISCELLANEOUS) ×8 IMPLANT
MARKER GRAFT CORONARY BYPASS (MISCELLANEOUS) ×12 IMPLANT
NDL SUT 4 .5 CRC FRENCH EYE (NEEDLE) ×2 IMPLANT
NEEDLE FRENCH EYE (NEEDLE) ×2
NS IRRIG 1000ML POUR BTL (IV SOLUTION) ×28 IMPLANT
PACK OPEN HEART (CUSTOM PROCEDURE TRAY) ×4 IMPLANT
PAD ARMBOARD 7.5X6 YLW CONV (MISCELLANEOUS) ×8 IMPLANT
PAD ELECT DEFIB RADIOL ZOLL (MISCELLANEOUS) ×4 IMPLANT
PENCIL BUTTON HOLSTER BLD 10FT (ELECTRODE) ×4 IMPLANT
PROBE CRYO2-ABLATION MALLABLE (MISCELLANEOUS) IMPLANT
PUNCH AORTIC ROTATE  4.5MM 8IN (MISCELLANEOUS) ×4 IMPLANT
PUNCH AORTIC ROTATE 4.0MM (MISCELLANEOUS) IMPLANT
PUNCH AORTIC ROTATE 4.5MM 8IN (MISCELLANEOUS) ×4 IMPLANT
PUNCH AORTIC ROTATE 5MM 8IN (MISCELLANEOUS) IMPLANT
SET CARDIOPLEGIA MPS 5001102 (MISCELLANEOUS) ×4 IMPLANT
SPOGE SURGIFLO 8M (HEMOSTASIS) ×4
SPONGE GAUZE 4X4 12PLY (GAUZE/BANDAGES/DRESSINGS) ×8 IMPLANT
SPONGE GAUZE 4X4 12PLY STER LF (GAUZE/BANDAGES/DRESSINGS) ×8 IMPLANT
SPONGE SURGIFLO 8M (HEMOSTASIS) ×4 IMPLANT
SUCKER INTRACARDIAC WEIGHTED (SUCKER) ×8 IMPLANT
SURGIFLO W/THROMBIN 8M KIT (HEMOSTASIS) ×4 IMPLANT
SUT BONE WAX W31G (SUTURE) ×4 IMPLANT
SUT ETHIBON 2 0 V 52N 30 (SUTURE) ×8 IMPLANT
SUT ETHIBOND 2 0 SH (SUTURE) ×2
SUT ETHIBOND 2 0 SH 36X2 (SUTURE) ×2 IMPLANT
SUT MNCRL AB 4-0 PS2 18 (SUTURE) ×4 IMPLANT
SUT PROLENE 3 0 RB 1 (SUTURE) ×4 IMPLANT
SUT PROLENE 3 0 SH 1 (SUTURE) IMPLANT
SUT PROLENE 3 0 SH DA (SUTURE) IMPLANT
SUT PROLENE 3 0 SH1 36 (SUTURE) ×12 IMPLANT
SUT PROLENE 4 0 RB 1 (SUTURE) ×28
SUT PROLENE 4 0 SH DA (SUTURE) ×8 IMPLANT
SUT PROLENE 4-0 RB1 .5 CRCL 36 (SUTURE) ×28 IMPLANT
SUT PROLENE 5 0 C 1 36 (SUTURE) IMPLANT
SUT PROLENE 6 0 C 1 30 (SUTURE) ×8 IMPLANT
SUT PROLENE 6 0 CC (SUTURE) ×12 IMPLANT
SUT PROLENE 8 0 BV175 6 (SUTURE) IMPLANT
SUT PROLENE BLUE 7 0 (SUTURE) ×4 IMPLANT
SUT SILK  1 MH (SUTURE)
SUT SILK 1 MH (SUTURE) IMPLANT
SUT SILK 1 TIES 10X30 (SUTURE) ×4 IMPLANT
SUT SILK 2 0 SH CR/8 (SUTURE) ×4 IMPLANT
SUT SILK 3 0 SH CR/8 (SUTURE) IMPLANT
SUT STEEL 6MS V (SUTURE) ×12 IMPLANT
SUT STEEL STERNAL CCS#1 18IN (SUTURE) ×4 IMPLANT
SUT STEEL SZ 6 DBL 3X14 BALL (SUTURE) ×8 IMPLANT
SUT VIC AB 1 CTX 36 (SUTURE) ×4
SUT VIC AB 1 CTX36XBRD ANBCTR (SUTURE) ×4 IMPLANT
SUT VIC AB 2-0 CT1 27 (SUTURE) ×2
SUT VIC AB 2-0 CT1 TAPERPNT 27 (SUTURE) ×2 IMPLANT
SUT VIC AB 2-0 CTX 27 (SUTURE) IMPLANT
SUT VIC AB 3-0 X1 27 (SUTURE) IMPLANT
SUTURE E-PAK OPEN HEART (SUTURE) ×4 IMPLANT
SYR 10ML KIT SKIN ADHESIVE (MISCELLANEOUS) IMPLANT
SYR TOOMEY 50ML (SYRINGE) ×4 IMPLANT
SYS ATRICLIP LAA EXCLUSION 35 (Clip) ×4 IMPLANT
SYS ATRICLIP LAA EXCLUSION 45 (CLIP) IMPLANT
SYSTEM SAHARA CHEST DRAIN ATS (WOUND CARE) ×4 IMPLANT
TAPE CLOTH SURG 4X10 WHT LF (GAUZE/BANDAGES/DRESSINGS) ×4 IMPLANT
TAPE PAPER 2X10 WHT MICROPORE (GAUZE/BANDAGES/DRESSINGS) ×4 IMPLANT
TOWEL OR 17X24 6PK STRL BLUE (TOWEL DISPOSABLE) ×8 IMPLANT
TOWEL OR 17X26 10 PK STRL BLUE (TOWEL DISPOSABLE) ×8 IMPLANT
TRAY FOLEY IC TEMP SENS 16FR (CATHETERS) ×8 IMPLANT
TUBING INSUFFLATION 10FT LAP (TUBING) ×4 IMPLANT
UNDERPAD 30X30 INCONTINENT (UNDERPADS AND DIAPERS) ×4 IMPLANT
VALVE MAGNA EASE AORTIC 23MM (Prosthesis & Implant Heart) ×4 IMPLANT
VENT LEFT HEART 12002 (CATHETERS) ×4
VRC MALLEABLE SINGLE STG 28FR (MISCELLANEOUS) ×4
WATER STERILE IRR 1000ML POUR (IV SOLUTION) ×8 IMPLANT

## 2013-05-12 NOTE — Procedures (Signed)
Extubation Procedure Note  Patient Details:   Name: JOEDY EICKHOFF DOB: 17-Dec-1927 MRN: [id number]   Airway Documentation:     Evaluation  O2 sats: stable throughout Complications: No apparent complications Patient did tolerate procedure well. Bilateral Breath Sounds: Rhonchi Suctioning: Airway Yes Extubated PT to 4l [place]. PT was able to tell his name and cough. VT(7l) NIF (-20)  Reine Bristow, Leonie Douglas 05/12/2013, 6:57 PM

## 2013-05-12 NOTE — Progress Notes (Signed)
  Echocardiogram Echocardiogram Transesophageal has been performed.  Philipp Deputy 05/12/2013, 3:34 PM

## 2013-05-12 NOTE — Anesthesia Postprocedure Evaluation (Signed)
  Anesthesia Post-op Note  Patient: Robert [name]  Procedure(s) Performed: Procedure(s) with comments: CORONARY ARTERY BYPASS GRAFTING (CABG) (N/A) - CABG x 1 using left leg greater saphenous vein harvested endoscopically AORTIC VALVE REPLACEMENT (AVR) (N/A) INTRAOPERATIVE TRANSESOPHAGEAL ECHOCARDIOGRAM (N/A) MAZE (N/A)  Patient Location: ICU  Anesthesia Type:General  Level of Consciousness: awake, alert  and oriented  Airway and Oxygen Therapy: Patient remains intubated per anesthesia plan  Post-op Pain: none  Post-op Assessment: Post-op Vital signs reviewed, Patient's Cardiovascular Status Stable, Respiratory Function Stable, No signs of Nausea or vomiting and Pain level controlled  Post-op Vital Signs: Reviewed and stable  Complications: No apparent anesthesia complications

## 2013-05-12 NOTE — OR Nursing (Signed)
1st call to SICU 

## 2013-05-12 NOTE — Anesthesia Procedure Notes (Signed)
Procedure Name: Intubation Date/Time: 05/12/2013 7:43 AM Performed by: Melina Copa, Inocencia Murtaugh R Pre-anesthesia Checklist: Patient identified, Emergency Drugs available, Suction available, Patient being monitored and Timeout performed Patient Re-evaluated:Patient Re-evaluated prior to inductionOxygen Delivery Method: Circle system utilized Preoxygenation: Pre-oxygenation with 100% oxygen Intubation Type: IV induction Ventilation: Mask ventilation without difficulty Laryngoscope Size: 4 Grade View: Grade I Tube type: Oral Tube size: 8.0 mm Number of attempts: 1 Airway Equipment and Method: Stylet Placement Confirmation: ETT inserted through vocal cords under direct vision and positive ETCO2 Secured at: 24 cm Tube secured with: Tape Dental Injury: Teeth and Oropharynx as per pre-operative assessment

## 2013-05-12 NOTE — Anesthesia Preprocedure Evaluation (Signed)
Anesthesia Evaluation  Patient identified by MRN, date of birth, ID band Patient awake    Reviewed: Allergy & Precautions, H&P , NPO status , Patient's Chart, lab work & pertinent test results, reviewed documented beta blocker date and time   Airway Mallampati: II TM Distance: >3 FB Neck ROM: Full    Dental  (+) Edentulous Upper, Edentulous Lower, Dental Advisory Given   Pulmonary          Cardiovascular hypertension, Pt. on medications and Pt. on home beta blockers + CAD and +CHF     Neuro/Psych    GI/Hepatic PUD, GERD-  Medicated and Controlled,  Endo/Other    Renal/GU      Musculoskeletal   Abdominal   Peds  Hematology   Anesthesia Other Findings   Reproductive/Obstetrics                           Anesthesia Physical Anesthesia Plan  ASA: III  Anesthesia Plan: General   Post-op Pain Management:    Induction: Intravenous  Airway Management Planned: Oral ETT  Additional Equipment: Arterial line, CVP, PA Cath, 3D TEE and Ultrasound Guidance Line Placement  Intra-op Plan:   Post-operative Plan: Post-operative intubation/ventilation  Informed Consent: I have reviewed the patients History and Physical, chart, labs and discussed the procedure including the risks, benefits and alternatives for the proposed anesthesia with the patient or authorized representative who has indicated his/her understanding and acceptance.   Dental advisory given  Plan Discussed with: CRNA, Anesthesiologist and Surgeon  Anesthesia Plan Comments:         Anesthesia Quick Evaluation

## 2013-05-12 NOTE — Anesthesia Postprocedure Evaluation (Signed)
  Anesthesia Post-op Note  Patient: Robert [name]  Procedure(s) Performed: Procedure(s) with comments: CORONARY ARTERY BYPASS GRAFTING (CABG) (N/A) - CABG x 1 using left leg greater saphenous vein harvested endoscopically AORTIC VALVE REPLACEMENT (AVR) (N/A) INTRAOPERATIVE TRANSESOPHAGEAL ECHOCARDIOGRAM (N/A) MAZE (N/A)  Patient Location: ICU  Anesthesia Type:General  Level of Consciousness: unresponsive and Patient remains intubated per anesthesia plan  Airway and Oxygen Therapy: Patient remains intubated per anesthesia plan and Patient placed on Ventilator (see vital sign flow sheet for setting)  Post-op Pain: none  Post-op Assessment: Post-op Vital signs reviewed, Patient's Cardiovascular Status Stable, Respiratory Function Stable, Patent Airway and No signs of Nausea or vomiting  Post-op Vital Signs: Reviewed and stable  Complications: No apparent anesthesia complications

## 2013-05-12 NOTE — Progress Notes (Signed)
Patient ID: Robert [name], male   DOB: 19-May-1927, 80 y.o.   MRN: [id number]   SICU Evening Rounds:   Hemodynamically stable , sinus 90 CI = 3 on dop 2, milrinone 0.3, amio, neo  Still on the vent  Urine output good  CT output low  CBC    Component Value Date/Time   WBC 9.3 05/12/2013 1454   RBC 3.48* 05/12/2013 1454   HGB 10.9* 05/12/2013 1500   HCT 32.0* 05/12/2013 1500   PLT 90* 05/12/2013 1454   MCV 96.3 05/12/2013 1454   MCH 33.6 05/12/2013 1454   MCHC 34.9 05/12/2013 1454   RDW 14.3 05/12/2013 1454   LYMPHSABS 1.1 05/06/2013 0755   MONOABS 0.4 05/06/2013 0755   EOSABS 0.1 05/06/2013 0755   BASOSABS 0.0 05/06/2013 0755     BMET    Component Value Date/Time   NA 139 05/12/2013 1500   K 3.6* 05/12/2013 1500   CL 100 05/12/2013 0545   CO2 22 05/12/2013 0545   GLUCOSE 122* 05/12/2013 1500   BUN 14 05/12/2013 0545   CREATININE 0.73 05/12/2013 0545   CALCIUM 9.3 05/12/2013 0545   GFRNONAA 83* 05/12/2013 0545   GFRAA >90 05/12/2013 0545     A/P:  Stable postop course. Continue current plans. Wean vent when ready.

## 2013-05-12 NOTE — Preoperative (Signed)
Beta Blockers   Reason not to administer Beta Blockers:12.5 mg Metoprolol given at 0515 hrs on 05/12/2013

## 2013-05-12 NOTE — Progress Notes (Signed)
The patient was examined and preop studies reviewed. There has been no change from the prior exam and the patient is ready for surgery.   Plan AVR  MAZE  CABG on ASER NYLUND today

## 2013-05-12 NOTE — Brief Op Note (Addendum)
05/06/2013 - 05/12/2013  1:31 PM  PATIENT:  Robert [name]  80 y.o. male  PRE-OPERATIVE DIAGNOSIS:  CAD, AS, Atrial fibrillation  POST-OPERATIVE DIAGNOSIS:  CAD, AS, Atrial fibrillation   PROCEDURE:    CORONARY ARTERY BYPASS GRAFTING x 1 (SVG- RV marginal)  AORTIC VALVE REPLACEMENT (23 mm Magna Ease pericardial tissue valve)  ENDOSCOPIC VEIN HARVEST RIGHT THIGH  LEFT AND RIGHT RADIOFREQUENCY MAZE  PLACEMENT LEFT ATRIAL CLIP   SURGEON:  Ivin Poot, MD  ASSISTANT: Suzzanne Cloud, PA-C  ANESTHESIA:   general  PATIENT CONDITION:  ICU - intubated and hemodynamically stable.  PRE-OPERATIVE WEIGHT: 77.4 kg     Aortic Valve Etiology   Aortic Insufficiency:  Severe  Aortic Valve Disease:  Yes.  Aortic Stenosis:  Yes. Smallest Aortic Valve Area: 0.6 cm2; Highest Mean Gradient: 38 mmHg.  Etiology (Choose at least one and up to  5 etiologies):  Degenerative - Calcified  Aortic Valve  Procedure Performed:  Replacement: Yes.  Bioprosthetic Valve. Implant Model Number:[id number], Size:23, Unique Device Identifier:[id number]  Repair/Reconstruction: No.   Aortic Annular Enlargement: No.

## 2013-05-12 NOTE — Transfer of Care (Signed)
Immediate Anesthesia Transfer of Care Note  Patient: Robert [name]  Procedure(s) Performed: Procedure(s) with comments: CORONARY ARTERY BYPASS GRAFTING (CABG) (N/A) - CABG x 1 using left leg greater saphenous vein harvested endoscopically AORTIC VALVE REPLACEMENT (AVR) (N/A) INTRAOPERATIVE TRANSESOPHAGEAL ECHOCARDIOGRAM (N/A) MAZE (N/A)  Patient Location: ICU  Anesthesia Type:General  Level of Consciousness: unresponsive and Patient remains intubated per anesthesia plan  Airway & Oxygen Therapy: Patient remains intubated per anesthesia plan and Patient placed on Ventilator (see vital sign flow sheet for setting)  Post-op Assessment: Report given to PACU RN and Post -op Vital signs reviewed and stable  Post vital signs: Reviewed and stable  Complications: No apparent anesthesia complications

## 2013-05-13 ENCOUNTER — Inpatient Hospital Stay (HOSPITAL_COMMUNITY): Payer: Medicare Other

## 2013-05-13 LAB — GLUCOSE, CAPILLARY
Glucose-Capillary: 100 mg/dL — ABNORMAL HIGH (ref 70–99)
Glucose-Capillary: 113 mg/dL — ABNORMAL HIGH (ref 70–99)
Glucose-Capillary: 114 mg/dL — ABNORMAL HIGH (ref 70–99)
Glucose-Capillary: 117 mg/dL — ABNORMAL HIGH (ref 70–99)
Glucose-Capillary: 123 mg/dL — ABNORMAL HIGH (ref 70–99)
Glucose-Capillary: 138 mg/dL — ABNORMAL HIGH (ref 70–99)
Glucose-Capillary: 163 mg/dL — ABNORMAL HIGH (ref 70–99)
Glucose-Capillary: 203 mg/dL — ABNORMAL HIGH (ref 70–99)

## 2013-05-13 LAB — POCT I-STAT, CHEM 8
BUN: 10 mg/dL (ref 6–23)
Calcium, Ion: 1.21 mmol/L (ref 1.13–1.30)
Chloride: 99 mEq/L (ref 96–112)
Creatinine, Ser: 0.8 mg/dL (ref 0.50–1.35)
Glucose, Bld: 150 mg/dL — ABNORMAL HIGH (ref 70–99)
HCT: 33 % — ABNORMAL LOW (ref 39.0–52.0)
Hemoglobin: 11.2 g/dL — ABNORMAL LOW (ref 13.0–17.0)
Potassium: 4.2 mEq/L (ref 3.7–5.3)
Sodium: 136 mEq/L — ABNORMAL LOW (ref 137–147)
TCO2: 24 mmol/L (ref 0–100)

## 2013-05-13 LAB — BASIC METABOLIC PANEL
BUN: 10 mg/dL (ref 6–23)
CO2: 22 mEq/L (ref 19–32)
Calcium: 8.4 mg/dL (ref 8.4–10.5)
Chloride: 105 mEq/L (ref 96–112)
Creatinine, Ser: 0.71 mg/dL (ref 0.50–1.35)
GFR calc Af Amer: >90 mL/min (ref >90)
GFR calc non Af Amer: 84 mL/min — ABNORMAL LOW (ref >90)
Glucose, Bld: 116 mg/dL — ABNORMAL HIGH (ref 70–99)
Potassium: 4.1 mEq/L (ref 3.7–5.3)
Sodium: 140 mEq/L (ref 137–147)

## 2013-05-13 LAB — PREPARE PLATELET PHERESIS: Unit division: 0

## 2013-05-13 LAB — CREATININE, SERUM
Creatinine, Ser: 0.71 mg/dL (ref 0.50–1.35)
GFR calc Af Amer: >90 mL/min (ref >90)
GFR calc non Af Amer: 84 mL/min — ABNORMAL LOW (ref >90)

## 2013-05-13 LAB — CBC
HCT: 31.2 % — ABNORMAL LOW (ref 39.0–52.0)
HCT: 32.7 % — ABNORMAL LOW (ref 39.0–52.0)
Hemoglobin: 10.9 g/dL — ABNORMAL LOW (ref 13.0–17.0)
Hemoglobin: 11.4 g/dL — ABNORMAL LOW (ref 13.0–17.0)
MCH: 33.4 pg (ref 26.0–34.0)
MCH: 33.6 pg (ref 26.0–34.0)
MCHC: 34.9 g/dL (ref 30.0–36.0)
MCHC: 34.9 g/dL (ref 30.0–36.0)
MCV: 95.7 fL (ref 78.0–100.0)
MCV: 96.5 fL (ref 78.0–100.0)
Platelets: 101 10*3/uL — ABNORMAL LOW (ref 150–400)
Platelets: 89 10*3/uL — ABNORMAL LOW (ref 150–400)
RBC: 3.26 MIL/uL — ABNORMAL LOW (ref 4.22–5.81)
RBC: 3.39 MIL/uL — ABNORMAL LOW (ref 4.22–5.81)
RDW: 14.2 % (ref 11.5–15.5)
RDW: 14.7 % (ref 11.5–15.5)
WBC: 10.4 10*3/uL (ref 4.0–10.5)
WBC: 11.3 10*3/uL — ABNORMAL HIGH (ref 4.0–10.5)

## 2013-05-13 LAB — MAGNESIUM
Magnesium: 2.4 mg/dL (ref 1.5–2.5)
Magnesium: 2.2 mg/dL (ref 1.5–2.5)

## 2013-05-13 MED ORDER — DIGOXIN 125 MCG PO TABS
0.1250 mg | ORAL_TABLET | Freq: Every day | ORAL | Status: DC
Start: 2013-05-13 — End: 2013-05-17
  Administered 2013-05-13 – 2013-05-16 (×4): 0.125 mg via ORAL
  Filled 2013-05-13 (×5): qty 1

## 2013-05-13 MED ORDER — POTASSIUM CHLORIDE 10 MEQ/50ML IV SOLN
10.0000 meq | Freq: Once | INTRAVENOUS | Status: AC
  Administered 2013-05-13: 10 meq via INTRAVENOUS
  Filled 2013-05-13: qty 50

## 2013-05-13 MED ORDER — MILRINONE IN DEXTROSE 20 MG/100ML IV SOLN: 0.3000 ug/kg/min | INTRAVENOUS | Status: DC

## 2013-05-13 MED ORDER — AMIODARONE IV BOLUS ONLY 150 MG/100ML
150.0000 mg | Freq: Once | INTRAVENOUS | Status: AC
  Administered 2013-05-13: 150 mg via INTRAVENOUS

## 2013-05-13 MED ORDER — TRAMADOL HCL 50 MG PO TABS
50.0000 mg | ORAL_TABLET | Freq: Four times a day (QID) | ORAL | Status: DC | PRN
  Administered 2013-05-13 – 2013-05-18 (×5): 50 mg via ORAL
  Filled 2013-05-13 (×5): qty 1

## 2013-05-13 MED ORDER — FUROSEMIDE 10 MG/ML IJ SOLN
20.0000 mg | Freq: Once | INTRAMUSCULAR | Status: AC
  Administered 2013-05-13: 20 mg via INTRAVENOUS
  Filled 2013-05-13: qty 2

## 2013-05-13 MED ORDER — INSULIN ASPART 100 UNIT/ML ~~LOC~~ SOLN
0.0000 [IU] | SUBCUTANEOUS | Status: DC
  Administered 2013-05-13: 4 [IU] via SUBCUTANEOUS
  Administered 2013-05-13: 2 [IU] via SUBCUTANEOUS

## 2013-05-13 MED ORDER — FUROSEMIDE 10 MG/ML IJ SOLN
20.0000 mg | Freq: Two times a day (BID) | INTRAMUSCULAR | Status: AC
  Administered 2013-05-13 – 2013-05-14 (×4): 20 mg via INTRAVENOUS
  Filled 2013-05-13 (×5): qty 2

## 2013-05-13 MED ORDER — INSULIN ASPART 100 UNIT/ML ~~LOC~~ SOLN
0.0000 [IU] | SUBCUTANEOUS | Status: DC
  Administered 2013-05-13: 2 [IU] via SUBCUTANEOUS
  Administered 2013-05-13: 8 [IU] via SUBCUTANEOUS
  Administered 2013-05-13: 4 [IU] via SUBCUTANEOUS
  Administered 2013-05-14 (×3): 2 [IU] via SUBCUTANEOUS

## 2013-05-13 MED FILL — Calcium Chloride Inj 10%: INTRAVENOUS | Qty: 10 | Status: AC

## 2013-05-13 MED FILL — Potassium Chloride Inj 2 mEq/ML: INTRAVENOUS | Qty: 40 | Status: AC

## 2013-05-13 MED FILL — Heparin Sodium (Porcine) Inj 1000 Unit/ML: INTRAMUSCULAR | Qty: 30 | Status: AC

## 2013-05-13 MED FILL — Sodium Bicarbonate IV Soln 8.4%: INTRAVENOUS | Qty: 50 | Status: AC

## 2013-05-13 MED FILL — Sodium Chloride IV Soln 0.9%: INTRAVENOUS | Qty: 3000 | Status: AC

## 2013-05-13 MED FILL — Magnesium Sulfate Inj 50%: INTRAMUSCULAR | Qty: 10 | Status: AC

## 2013-05-13 MED FILL — Mannitol IV Soln 20%: INTRAVENOUS | Qty: 500 | Status: AC

## 2013-05-13 MED FILL — Lidocaine HCl IV Inj 20 MG/ML: INTRAVENOUS | Qty: 5 | Status: AC

## 2013-05-13 MED FILL — Albumin, Human Inj 5%: INTRAVENOUS | Qty: 250 | Status: AC

## 2013-05-13 MED FILL — Electrolyte-R (PH 7.4) Solution: INTRAVENOUS | Qty: 5000 | Status: AC

## 2013-05-13 MED FILL — Heparin Sodium (Porcine) Inj 1000 Unit/ML: INTRAMUSCULAR | Qty: 10 | Status: AC

## 2013-05-13 NOTE — Progress Notes (Signed)
1 Day Post-Op Procedure(s) (LRB): CORONARY ARTERY BYPASS GRAFTING (CABG) (N/A) AORTIC VALVE REPLACEMENT (AVR) (N/A) INTRAOPERATIVE TRANSESOPHAGEAL ECHOCARDIOGRAM (N/A) MAZE (N/A) Subjective:  extubated, NSR with PAC's after AVR-Maze-CABG yesterday CXR clear Neuro intact extrem warm  Objective: Vital signs in last 24 hours: Temp:  [97.3 F (36.3 C)-100.9 F (38.3 C)] 98.6 F (37 C) (03/13 0700) Pulse Rate:  [79-109] 91 (03/13 0630) Cardiac Rhythm:  [-] Atrial fibrillation (03/13 0700) Resp:  [12-27] 19 (03/13 0700) BP: (92-131)/(50-68) 113/58 mmHg (03/13 0630) SpO2:  [90 %-97 %] 93 % (03/13 0700) FiO2 (%):  [50 %] 50 % (03/12 1611) Weight:  [182 lb 1.6 oz (82.6 kg)] 182 lb 1.6 oz (82.6 kg) (03/13 0515)  Hemodynamic parameters for last 24 hours: PAP: (31-55)/(13-30) 40/19 mmHg CO:  [5.1 L/min-6.8 L/min] 5.8 L/min CI:  [2.6 L/min/m2-3.5 L/min/m2] 3 L/min/m2  Intake/Output from previous day: 03/12 0701 - 03/13 0700 In: 6982.9 [I.V.:4692.9; Blood:1140; IV Piggyback:1150] Out: 1660 [Urine:3135; Chest Tube:298] Intake/Output this shift:      Lab Results:  Recent Labs  05/12/13 2100 05/12/13 2114 05/13/13 0345  WBC 13.0*  --  10.4  HGB 11.5* 11.6* 10.9*  HCT 33.1* 34.0* 31.2*  PLT 128*  --  101*   BMET:  Recent Labs  05/12/13 0545  05/12/13 2114 05/13/13 0345  NA 138  < > 140 140  K 4.4  < > 4.3 4.1  CL 100  --  104 105  CO2 22  --   --  22  GLUCOSE 100*  < > 129* 116*  BUN 14  --  8 10  CREATININE 0.73  < > 0.70 0.71  CALCIUM 9.3  --   --  8.4  < > = values in this interval not displayed.  PT/INR:  Recent Labs  05/12/13 1454  LABPROT 18.6*  INR 1.60*   ABG    Component Value Date/Time   PHART 7.335* 05/12/2013 1959   HCO3 23.1 05/12/2013 1959   TCO2 21 05/12/2013 2114   ACIDBASEDEF 3.0* 05/12/2013 1959   O2SAT 93.0 05/12/2013 1959   CBG (last 3)   Recent Labs  05/12/13 2112 05/12/13 2217 05/12/13 2325  GLUCAP 123* 160* 137*     Assessment/Plan: S/P Procedure(s) (LRB): CORONARY ARTERY BYPASS GRAFTING (CABG) (N/A) AORTIC VALVE REPLACEMENT (AVR) (N/A) INTRAOPERATIVE TRANSESOPHAGEAL ECHOCARDIOGRAM (N/A) MAZE (N/A) Cont IV amio wean inotropes diuresisi and mobilize OOB   LOS: 7 days    VAN TRIGT III,Cinsere Mizrahi 05/13/2013

## 2013-05-13 NOTE — Progress Notes (Signed)
EKG CRITICAL VALUE     12 lead EKG performed.  Critical value noted.  Emerson Monte, RN notified.   Elven Laboy L, CCT 05/13/2013 8:00 AM

## 2013-05-13 NOTE — Op Note (Signed)
NAMEMarland [name]  [name], [name] NO.:  [id number]  MEDICAL RECORD NO.:  [id number]  LOCATION:  2S02C                        FACILITY:  [facility]  PHYSICIAN:  Ivin Poot, M.D.  DATE OF BIRTH:  12/25/1927  DATE OF PROCEDURE:  05/12/2013 DATE OF DISCHARGE:                              OPERATIVE REPORT   OPERATION: 1. Aortic valve replacement using a 23 mm pericardial Magna Ease valve     (serial [id number]). 2. Left atrial Maze procedure using radiofrequency ablation encircling     right and left-sided pulmonary veins, left atrial appendage, and     box set lesions of the floor of the left atrium. 3. Coronary artery bypass grafting times 1-2, saphenous vein graft to     RV marginal. 4. Left atrial closure using the left atrial clip 3.5 cm. 5. Endoscopic harvest of left leg greater saphenous vein.  SURGEON:  Ivin Poot, M.D.  ASSISTANT:  Suzzanne Cloud, PA-C  PREOPERATIVE DIAGNOSES: 1. Severe aortic stenosis with class IV congestive heart failure 2. Refractory rapid atrial fibrillation with increased heart rate. 3. Chronic total occlusion of the right coronary artery.  POSTOPERATIVE DIAGNOSES: 1. Severe aortic stenosis with class IV congestive heart failure 2. Refractory rapid atrial fibrillation with increased heart rate. 3. Chronic total occlusion of the right coronary artery.  ANESTHESIA:  General by Dr. Laurie Panda.  INDICATIONS:  The patient is an 80 year old and presented to the hospital with heart failure symptoms and rapid atrial fibrillation.  An echocardiogram showed his EF to be 45-50% with mild MR, mild TR, and severe aortic stenosis with a heavily calcified aortic valve with a valve area of 0.5.  Cardiac catheterization demonstrated chronic occlusion of the right coronary, but patent LAD and circumflex systems. Pulmonary pressures were mildly elevated.  The patient was felt to be candidate for combined aortic valve replacement and maze procedure as  well as single-vessel CABG.  Prior to surgery, I examined the patient in this hospital room and reviewed the results of the cardiac cath, echo, and his laboratory values with the patient and family.  I discussed the indications and expected benefits of aortic valve replacement, the best long-term therapy for his severe aortic stenosis-I discussed Maze procedure for refractory rapid atrial fibrillation, which was significantly symptomatic-and I discussed single-vessel CABG to his totally occluded RCA.  I discussed the major aspects of surgery including use of general anesthesia and cardiopulmonary bypass, the location of the surgical incisions, and the expected postoperative hospital recovery.  I discussed with the patient and family, the risks to him of the operation including the risks of stroke, MI, bleeding, blood transfusion requirement, persistent atrial fibrillation, bradycardia requiring permanent pacemaker, pleural effusions, wound infection, and death. After reviewing these issues, he demonstrated his understanding and agreed to proceed with the surgery under what I felt was an informed consent.  OPERATIVE FINDINGS: 1. Heavily calcified aortic valve with severe calcification of the     anulus extending into the anterior leaflet of the mitral valve with     limited mobility of the anterior leaflet of the mitral valve. 2. Successful termination of refractory rapid atrial fibrillation with     maze procedure. 3. Preserved LV systolic function  at termination of cardiopulmonary     bypass by transesophageal echo.  OPERATIVE PROCEDURE:  The patient was brought to the operating room and placed supine on the operating room table, where general anesthesia was induced under invasive hemodynamic monitoring.  I had to place the Foley catheter into the bladder as the nurses were unable to do so due to his previous prostate surgery.  The patient was then prepped and draped as a sterile  field.  A transesophageal echo probe was placed by the anesthesiologist which confirmed the preoperative diagnosis of severe aortic stenosis.  A proper time-out was performed.  After the patient was properly prepped and draped, a sternal incision was made as the saphenous vein was harvested endoscopically from the left leg.  The pericardium was opened and suspended.  Pursestrings were placed in the ascending aorta and right atrium and after the vein had been harvested, the patient was fully heparinized.  The ACT was documented as being therapeutic, and then the patient was cannulated and placed on cardiopulmonary bypass.  A second pursestring was placed in the low right atrium for bicaval cannulation and caval tapes were placed around each cannula.  The RV marginal branch was a 1.5-mm vessel and was a adequate target for grafting.  Cardioplegic cannulas were placed both antegrade and retrograde cold blood cardioplegia.  The patient was cooled to 32 degrees.  The crossclamp was applied.  One liter of cold blood cardioplegia was delivered in split doses between the antegrade and retrograde coronary sinus catheters.  There was good cardioplegic arrest and septal temperature dropped less than 12 degrees.  Cardioplegia was delivered every 20 minutes.  First, the distal anastomosis to the RV marginal was performed.  The RV marginal was 1.5 mm.  A reverse saphenous vein was sewn end-to-side with running 7-0 Prolene.  There was good flow through the graft.  A cardioplegia was redosed.  Next, the left atrial Maze ablation lines were created using the radiofrequency ablation bipolar clamp.  First, the left-sided pulmonary veins were dissected and encircled with a vessel loop.  The clamp was placed around the junction between the left-sided pulmonary veins and the left atrium and 2 ablation lines were created.  Next, ablation line was created at the base of the left atrial appendage.   After this, the 3.5 cm atrial clip was applied to the base of the appendage.  Next, the interatrial groove was dissected.  A left atriotomy was performed.  The atrial retractors were positioned.  The bipolar clamp was then used to complete the ablation line posteriorly from the anterior incision at the junction of the right-sided pulmonary veins and the left atrium.  Next, ablation lines were placed across the floor of the left atrium extending from the lower aspect of the aortotomy incision across to the ablation lines around the left-sided pulmonary veins.  A second ablation line between the lower part of the incision to the mitral valve anulus in the P3 segment was then placed.  An additional ablation line was placed across the floor of the left atrium to the left atrial appendage ablation line.  This completed the left atrial ablation set.  The atriotomy was closed in 2 layers using running 3-0 Prolene and cardioplegia was redosed.  Attention was then directed to the aortic valve.  A transverse aortotomy was performed.  The aortic valve was heavily calcified and basically nonmobile.  It was excised and anulus was debrided of large amounts of calcium.  The outflow tract  was irrigated with copious amounts of saline.  The anulus was sized to a 23 mm magna ease valve.  Subannular 2- 0 pledgeted sutures were placed around the anulus  numbering 17 in total.  The valve was prepared according to protocol and the sutures were placed through the sewing ring of the valve.  The valve was seated and the sutures were tied.  The valve conformed to the anulus well and there were no spaces for perivalvular leak.  Both coronary ostia were widely patent.  The aortotomy was closed in 2 layers using running 4-0 Prolene.  Cardioplegia was redosed.  While the crossclamp was still in place, the proximal vein anastomosis to the ascending aorta was created with a 4.5 mm punch running 6-0 Prolene.  Prior  to tying down the final proximal anastomosis, air was vented from the coronaries with a dose of retrograde warm blood cardioplegia.  The crossclamp was removed.  The heart resumed a spontaneous rhythm and was not in atrial fibrillation.  It appeared to be in sinus rhythm.  The patient was rewarmed and reperfused.  The vein graft was de-aired and perfused and had good flow and hemostasis was documented to the proximal distal anastomosis.  Hemostasis was documented at the atriotomy closure as well as the aortotomy closure.  Temporary pacing wires were applied.  The lungs were expanded and the ventilator was resumed.  The patient was adequately reperfused and rewarmed.  Low-dose dopamine and milrinone were started.  The patient was weaned from cardiopulmonary bypass without difficulty.  Cardiac output and blood pressure were stable.  The echo showed preserved LV function with normal functioning aortic valve. There is residual mild MR and mild TR unchanged from preop echo.  The patient was given protamine without adverse reaction.  The cannulas were removed.  The mediastinum was irrigated.  The superior pericardial fat was closed over the aorta.  An anterior mediastinal and right pleural chest tube were placed and brought out through separate incisions.  The sternum was closed with wire.  The pectoralis fascia was closed in a running #1 Vicryl.  The subcutaneous and skin layers were closed in running Vicryl and sterile dressings were applied.  Total cardiopulmonary bypass time was 200 minutes.     Ivin Poot, M.D.     PV/MEDQ  D:  05/12/2013  T:  05/13/2013  Job:  [id number]

## 2013-05-14 ENCOUNTER — Inpatient Hospital Stay (HOSPITAL_COMMUNITY): Payer: Medicare Other

## 2013-05-14 LAB — CBC
HCT: 31.3 % — ABNORMAL LOW (ref 39.0–52.0)
Hemoglobin: 10.7 g/dL — ABNORMAL LOW (ref 13.0–17.0)
MCH: 33.1 pg (ref 26.0–34.0)
MCHC: 34.2 g/dL (ref 30.0–36.0)
MCV: 96.9 fL (ref 78.0–100.0)
Platelets: 71 10*3/uL — ABNORMAL LOW (ref 150–400)
RBC: 3.23 MIL/uL — ABNORMAL LOW (ref 4.22–5.81)
RDW: 14.7 % (ref 11.5–15.5)
WBC: 10.7 10*3/uL — ABNORMAL HIGH (ref 4.0–10.5)

## 2013-05-14 LAB — TYPE AND SCREEN
ABO/RH(D): O NEG
Antibody Screen: NEGATIVE
Unit division: 0
Unit division: 0

## 2013-05-14 LAB — GLUCOSE, CAPILLARY
Glucose-Capillary: 125 mg/dL — ABNORMAL HIGH (ref 70–99)
Glucose-Capillary: 128 mg/dL — ABNORMAL HIGH (ref 70–99)
Glucose-Capillary: 131 mg/dL — ABNORMAL HIGH (ref 70–99)
Glucose-Capillary: 145 mg/dL — ABNORMAL HIGH (ref 70–99)
Glucose-Capillary: 152 mg/dL — ABNORMAL HIGH (ref 70–99)
Glucose-Capillary: 197 mg/dL — ABNORMAL HIGH (ref 70–99)

## 2013-05-14 LAB — BASIC METABOLIC PANEL
BUN: 12 mg/dL (ref 6–23)
CO2: 25 mEq/L (ref 19–32)
Calcium: 8.6 mg/dL (ref 8.4–10.5)
Chloride: 99 mEq/L (ref 96–112)
Creatinine, Ser: 0.65 mg/dL (ref 0.50–1.35)
GFR calc Af Amer: >90 mL/min (ref >90)
GFR calc non Af Amer: 88 mL/min — ABNORMAL LOW (ref >90)
Glucose, Bld: 142 mg/dL — ABNORMAL HIGH (ref 70–99)
Potassium: 4.3 mEq/L (ref 3.7–5.3)
Sodium: 134 mEq/L — ABNORMAL LOW (ref 137–147)

## 2013-05-14 MED ORDER — AMIODARONE HCL 200 MG PO TABS
200.0000 mg | ORAL_TABLET | Freq: Two times a day (BID) | ORAL | Status: DC
  Administered 2013-05-14 – 2013-05-16 (×5): 200 mg via ORAL
  Filled 2013-05-14 (×6): qty 1

## 2013-05-14 MED ORDER — SODIUM CHLORIDE 0.9 % IJ SOLN: 3.0000 mL | INTRAMUSCULAR | Status: DC | PRN

## 2013-05-14 MED ORDER — ACETAMINOPHEN 325 MG PO TABS
650.0000 mg | ORAL_TABLET | Freq: Four times a day (QID) | ORAL | Status: DC | PRN
  Administered 2013-05-18: 650 mg via ORAL
  Filled 2013-05-14: qty 2

## 2013-05-14 MED ORDER — ASPIRIN EC 81 MG PO TBEC
81.0000 mg | DELAYED_RELEASE_TABLET | Freq: Every day | ORAL | Status: DC
  Administered 2013-05-15 – 2013-05-18 (×4): 81 mg via ORAL
  Filled 2013-05-14 (×4): qty 1

## 2013-05-14 MED ORDER — AMIODARONE IV BOLUS ONLY 150 MG/100ML
150.0000 mg | Freq: Once | INTRAVENOUS | Status: AC
  Administered 2013-05-14: 150 mg via INTRAVENOUS

## 2013-05-14 MED ORDER — MICONAZOLE NITRATE 2 % EX CREA
TOPICAL_CREAM | Freq: Two times a day (BID) | CUTANEOUS | Status: DC
  Administered 2013-05-15 – 2013-05-17 (×6): via TOPICAL
  Filled 2013-05-14: qty 14

## 2013-05-14 MED ORDER — MAGNESIUM HYDROXIDE 400 MG/5ML PO SUSP: 30.0000 mL | Freq: Every day | ORAL | Status: DC | PRN

## 2013-05-14 MED ORDER — SODIUM CHLORIDE 0.9 % IV SOLN: 250.0000 mL | INTRAVENOUS | Status: DC | PRN

## 2013-05-14 MED ORDER — INSULIN ASPART 100 UNIT/ML ~~LOC~~ SOLN
0.0000 [IU] | Freq: Three times a day (TID) | SUBCUTANEOUS | Status: DC
  Administered 2013-05-14: 2 [IU] via SUBCUTANEOUS

## 2013-05-14 MED ORDER — WARFARIN - PHYSICIAN DOSING INPATIENT
Freq: Every day | Status: DC
  Administered 2013-05-16 – 2013-05-18 (×2)

## 2013-05-14 MED ORDER — SODIUM CHLORIDE 0.9 % IJ SOLN
3.0000 mL | Freq: Two times a day (BID) | INTRAMUSCULAR | Status: DC
  Administered 2013-05-14: 3 mL via INTRAVENOUS

## 2013-05-14 MED ORDER — WARFARIN SODIUM 2.5 MG PO TABS
2.5000 mg | ORAL_TABLET | Freq: Every day | ORAL | Status: DC
  Administered 2013-05-14 – 2013-05-18 (×5): 2.5 mg via ORAL
  Filled 2013-05-14 (×5): qty 1

## 2013-05-14 MED ORDER — FUROSEMIDE 40 MG PO TABS
40.0000 mg | ORAL_TABLET | Freq: Every day | ORAL | Status: DC
  Administered 2013-05-15 – 2013-05-18 (×4): 40 mg via ORAL
  Filled 2013-05-14 (×4): qty 1

## 2013-05-14 MED ORDER — MOVING RIGHT ALONG BOOK
Freq: Once | Status: DC
  Filled 2013-05-14: qty 1

## 2013-05-14 NOTE — Progress Notes (Signed)
CARDIAC REHAB PHASE I   PRE:  Rate/Rhythm: 89 irregular  BP:  Supine:   Sitting: 99/78  Standing:    SaO2: 98% RA  MODE:  Ambulation: 150 ft   POST:  Rate/Rhythem: 97 irregular  BP:  Supine:   Sitting: 108/64  Standing:    SaO2: 93% RA  1346-1422 Patient tolerated ambulation fair with assist x1 and pushing rolling walker, SOB during walk, VSS. To bed after walk, foley intact, call bell within reach. Pt requested suction for secretions, I informed patient's RN of request.   Seward Carol, MS, ACSM CES

## 2013-05-14 NOTE — Progress Notes (Signed)
2 Days Post-Op Procedure(s) (LRB): CORONARY ARTERY BYPASS GRAFTING (CABG) (N/A) AORTIC VALVE REPLACEMENT (AVR) (N/A) INTRAOPERATIVE TRANSESOPHAGEAL ECHOCARDIOGRAM (N/A) MAZE (N/A) Subjective: Postop day #2 aVR, maze, CABG x1 The patient is in relating in the hallway He remains on IV amiodarone and will transition to oral amiodarone-intermittent atrial fibrillation with sinus and PACs Chest x-ray clear Neuro intact Mild edema from surgery We'll start daily Coumadin dosing and transfer to step down  Objective: Vital signs in last 24 hours: Temp:  [97.4 F (36.3 C)-100 F (37.8 C)] 97.4 F (36.3 C) (03/14 0400) Pulse Rate:  [30-111] 107 (03/14 0937) Cardiac Rhythm:  [-] Sinus tachycardia (03/14 0800) Resp:  [17-31] 17 (03/14 0900) BP: (103-147)/(57-106) 103/68 mmHg (03/14 0900) SpO2:  [91 %-98 %] 97 % (03/14 0900) Weight:  [184 lb 8.4 oz (83.7 kg)] 184 lb 8.4 oz (83.7 kg) (03/14 0500)  Hemodynamic parameters for last 24 hours: PAP: (35-46)/(17-23) 36/18 mmHg  Intake/Output from previous day: 03/13 0701 - 03/14 0700 In: 1845.1 [P.O.:170; I.V.:1325.1; IV Piggyback:350] Out: 1615 [Urine:1535; Chest Tube:80] Intake/Output this shift: Total I/O In: 553.4 [P.O.:480; I.V.:73.4] Out: 40 [Urine:40]  Patient sitting in chair alert and comfortable Lungs clear No cardiac murmur Extremities warm  Lab Results:  Recent Labs  05/13/13 1700 05/13/13 1724 05/14/13 0430  WBC 11.3*  --  10.7*  HGB 11.4* 11.2* 10.7*  HCT 32.7* 33.0* 31.3*  PLT 89*  --  71*   BMET:  Recent Labs  05/13/13 0345  05/13/13 1724 05/14/13 0430  NA 140  --  136* 134*  K 4.1  --  4.2 4.3  CL 105  --  99 99  CO2 22  --   --  25  GLUCOSE 116*  --  150* 142*  BUN 10  --  10 12  CREATININE 0.71  < > 0.80 0.65  CALCIUM 8.4  --   --  8.6  < > = values in this interval not displayed.  PT/INR:  Recent Labs  05/12/13 1454  LABPROT 18.6*  INR 1.60*   ABG    Component Value Date/Time   PHART  7.335* 05/12/2013 1959   HCO3 23.1 05/12/2013 1959   TCO2 24 05/13/2013 1724   ACIDBASEDEF 3.0* 05/12/2013 1959   O2SAT 93.0 05/12/2013 1959   CBG (last 3)   Recent Labs  05/13/13 2344 05/14/13 0350 05/14/13 0730  GLUCAP 152* 145* 131*    Assessment/Plan: S/P Procedure(s) (LRB): CORONARY ARTERY BYPASS GRAFTING (CABG) (N/A) AORTIC VALVE REPLACEMENT (AVR) (N/A) INTRAOPERATIVE TRANSESOPHAGEAL ECHOCARDIOGRAM (N/A) MAZE (N/A) Plan for transfer to step-down: see transfer orders Daily Coumadin for maze procedure  LOS: 8 days    VAN TRIGT III,Davidlee Jeanbaptiste 05/14/2013

## 2013-05-14 NOTE — Progress Notes (Signed)
Patient transferred to 2W22 via wheelchair.  At time of transfer patient was alert, oriented and VSS.  Pain in control and patient comfortable at time of transfer.  All patient belongings, patient chart and meds moved with patient and patient left in the care of Minna Merritts, [place].   Bobette Mo

## 2013-05-15 ENCOUNTER — Inpatient Hospital Stay (HOSPITAL_COMMUNITY): Payer: Medicare Other

## 2013-05-15 LAB — GLUCOSE, CAPILLARY
Glucose-Capillary: 107 mg/dL — ABNORMAL HIGH (ref 70–99)
Glucose-Capillary: 112 mg/dL — ABNORMAL HIGH (ref 70–99)
Glucose-Capillary: 127 mg/dL — ABNORMAL HIGH (ref 70–99)

## 2013-05-15 LAB — CBC
HCT: 30.3 % — ABNORMAL LOW (ref 39.0–52.0)
Hemoglobin: 10.6 g/dL — ABNORMAL LOW (ref 13.0–17.0)
MCH: 33.8 pg (ref 26.0–34.0)
MCHC: 35 g/dL (ref 30.0–36.0)
MCV: 96.5 fL (ref 78.0–100.0)
Platelets: 94 K/uL — ABNORMAL LOW (ref 150–400)
RBC: 3.14 MIL/uL — ABNORMAL LOW (ref 4.22–5.81)
RDW: 14.6 % (ref 11.5–15.5)
WBC: 9.9 K/uL (ref 4.0–10.5)

## 2013-05-15 LAB — BASIC METABOLIC PANEL WITH GFR
BUN: 17 mg/dL (ref 6–23)
CO2: 26 meq/L (ref 19–32)
Calcium: 8.4 mg/dL (ref 8.4–10.5)
Chloride: 98 meq/L (ref 96–112)
Creatinine, Ser: 0.64 mg/dL (ref 0.50–1.35)
GFR calc Af Amer: >90 mL/min (ref >90)
GFR calc non Af Amer: 88 mL/min — ABNORMAL LOW (ref >90)
Glucose, Bld: 163 mg/dL — ABNORMAL HIGH (ref 70–99)
Potassium: 4 meq/L (ref 3.7–5.3)
Sodium: 135 meq/L — ABNORMAL LOW (ref 137–147)

## 2013-05-15 LAB — PROTIME-INR
INR: 1.28 (ref 0.00–1.49)
Prothrombin Time: 15.7 s — ABNORMAL HIGH (ref 11.6–15.2)

## 2013-05-15 MED ORDER — SIMETHICONE 80 MG PO CHEW
80.0000 mg | CHEWABLE_TABLET | Freq: Four times a day (QID) | ORAL | Status: DC | PRN
  Filled 2013-05-15: qty 1

## 2013-05-15 MED ORDER — METOPROLOL TARTRATE 25 MG PO TABS
25.0000 mg | ORAL_TABLET | Freq: Two times a day (BID) | ORAL | Status: DC
  Administered 2013-05-15 – 2013-05-18 (×7): 25 mg via ORAL
  Filled 2013-05-15 (×8): qty 1

## 2013-05-15 MED ORDER — PROMETHAZINE HCL 25 MG/ML IJ SOLN: 6.2500 mg | Freq: Three times a day (TID) | INTRAMUSCULAR | Status: DC | PRN

## 2013-05-15 MED ORDER — ALUM & MAG HYDROXIDE-SIMETH 200-200-20 MG/5ML PO SUSP
30.0000 mL | ORAL | Status: DC | PRN
  Administered 2013-05-15: 30 mL via ORAL
  Filled 2013-05-15: qty 30

## 2013-05-15 MED ORDER — METOCLOPRAMIDE HCL 5 MG PO TABS
5.0000 mg | ORAL_TABLET | Freq: Three times a day (TID) | ORAL | Status: DC
  Administered 2013-05-15 – 2013-05-18 (×14): 5 mg via ORAL
  Filled 2013-05-15 (×17): qty 1

## 2013-05-15 NOTE — Progress Notes (Addendum)
[address]       [place] [address]             [phone]      3 Days Post-Op  Procedure(s) (LRB): CORONARY ARTERY BYPASS GRAFTING (CABG) (N/A) AORTIC VALVE REPLACEMENT (AVR) (N/A) INTRAOPERATIVE TRANSESOPHAGEAL ECHOCARDIOGRAM (N/A) MAZE (N/A) Subjective: Feeling well, some heartburn last pm  Objective  Telemetry afib  Temp:  [97.8 F (36.6 C)-98.4 F (36.9 C)] 97.8 F (36.6 C) (03/15 0526) Pulse Rate:  [50-114] 114 (03/15 0526) Resp:  [18-22] 18 (03/15 0526) BP: (97-112)/(64-80) 111/72 mmHg (03/15 0526) SpO2:  [95 %-100 %] 95 % (03/15 0526)   Intake/Output Summary (Last 24 hours) at 05/15/13 0906 Last data filed at 05/15/13 0700  Gross per 24 hour  Intake  453.4 ml  Output    800 ml  Net -346.6 ml       General appearance: alert, cooperative and no distress Heart: irregularly irregular rhythm Lungs: clear to auscultation bilaterally Abdomen: mild distension, + BS soft, nontender Extremities: minor edema Wound: sternotomy dressing intact, EVH incis healing well  Lab Results:  Recent Labs  05/13/13 0345 05/13/13 1700 05/13/13 1724 05/14/13 0430  NA 140  --  136* 134*  K 4.1  --  4.2 4.3  CL 105  --  99 99  CO2 22  --   --  25  GLUCOSE 116*  --  150* 142*  BUN 10  --  10 12  CREATININE 0.71 0.71 0.80 0.65  CALCIUM 8.4  --   --  8.6  MG 2.4 2.2  --   --    No results found for this basename: AST, ALT, ALKPHOS, BILITOT, PROT, ALBUMIN,  in the last 72 hours No results found for this basename: LIPASE, AMYLASE,  in the last 72 hours  Recent Labs  05/13/13 1700 05/13/13 1724 05/14/13 0430  WBC 11.3*  --  10.7*  HGB 11.4* 11.2* 10.7*  HCT 32.7* 33.0* 31.3*  MCV 96.5  --  96.9  PLT 89*  --  71*   No results found for this basename: CKTOTAL, CKMB, TROPONINI,  in the last 72 hours No components found with this basename: POCBNP,  No results found for this basename: DDIMER,  in the last 72 hours No results found for this  basename: HGBA1C,  in the last 72 hours No results found for this basename: CHOL, HDL, LDLCALC, TRIG, CHOLHDL,  in the last 72 hours No results found for this basename: TSH, T4TOTAL, FREET3, T3FREE, THYROIDAB,  in the last 72 hours No results found for this basename: VITAMINB12, FOLATE, FERRITIN, TIBC, IRON, RETICCTPCT,  in the last 72 hours  Medications: Scheduled . acetaminophen  1,000 mg Oral 4 times per day   Or  . acetaminophen (TYLENOL) oral liquid 160 mg/5 mL  1,000 mg Per Tube 4 times per day  . amiodarone  200 mg Oral BID  . aspirin EC  81 mg Oral Daily  . atorvastatin  20 mg Oral q1800  . bisacodyl  10 mg Oral Daily   Or  . bisacodyl  10 mg Rectal Daily  . cholecalciferol  1,000 Units Oral Daily  . digoxin  0.125 mg Oral Daily  . docusate sodium  200 mg Oral Daily  . furosemide  40 mg Oral Daily  . insulin aspart  0-24 Units Subcutaneous TID AC & HS  . metoCLOPramide  5 mg Oral TID AC & HS  . metoprolol tartrate  25 mg Oral BID  . miconazole   Topical BID  . moving right along book   Does not apply Once  . mupirocin ointment   Nasal BID  . pantoprazole  40 mg Oral Daily  . sodium chloride  3 mL Intravenous Q12H  . sodium chloride  3 mL Intravenous Q12H  . warfarin  2.5 mg Oral q1800  . Warfarin - Physician Dosing Inpatient   Does not apply q1800     Radiology/Studies:  Dg Chest 2 View  05/15/2013   CLINICAL DATA:  Aortic valve replacement  EXAM: CHEST  2 VIEW  COMPARISON:  05/14/2013  FINDINGS: Left pleural effusion and left lower lobe atelectasis unchanged. Slight increase in right lower lobe atelectasis and right pleural effusion which is small. Negative for heart failure or edema.  IMPRESSION: Mild increase in right lower lobe atelectasis and small right effusion  Left lower lobe atelectasis and small left effusion are unchanged.   Electronically Signed   By: Franchot Gallo M.D.   On: 05/15/2013 05:59   Dg Chest Port 1 View  05/14/2013   CLINICAL DATA:  Status post  cardiac surgery  EXAM: PORTABLE CHEST - 1 VIEW  COMPARISON:  05/13/2013  FINDINGS: Cardiac shadow remains enlarged. Postsurgical changes are again seen. The right-sided thoracostomy catheter and Swan-Ganz catheter have been removed. Mediastinal drain is been removed as well. No recurrent pneumothorax is seen. Minimal thickening of the right minor fissure is noted. AE right jugular sheath remains in place. Minimal bibasilar atelectasis is noted.  IMPRESSION: Mild bibasilar changes.  No pneumothorax following chest tube removal.   Electronically Signed   By: Inez Catalina M.D.   On: 05/14/2013 07:09    INR: Will add last result for INR, ABG once components are confirmed Will add last 4 CBG results once components are confirmed  Assessment/Plan: S/P Procedure(s) (LRB): CORONARY ARTERY BYPASS GRAFTING (CABG) (N/A) AORTIC VALVE REPLACEMENT (AVR) (N/A) INTRAOPERATIVE TRANSESOPHAGEAL ECHOCARDIOGRAM (N/A) MAZE (N/A)  1 Conts to do very well 2 poss d/c foley today 3 sugars controlled 4 minor atx./small effusions, cont pulm toilet/gentle diuresis 5 on amio/dig/beta blocker for afib 6 no INR today- will order for coumadin dosing, labs otherwise stable 7 bp runs relatively low- no ace inhbitor at this time   LOS: 9 days    GOLD,WAYNE E 3/15/20159:06 AM  Foley placed with extreme difficulty in OR-leave Foley in place until Monday Initially in sinus rhythm after Maze procedure now controlled A. fib-Coumadin starting 2.5 mg daily. Will need home health nursing at discharge for restorative of care and INR draw  patient examined and medical record reviewed,agree with above note. VAN TRIGT III,Hassen Bruun 05/15/2013

## 2013-05-16 LAB — PULMONARY FUNCTION TEST
DL/VA % pred: 90 %
DL/VA: 4.1 ml/min/mmHg/L
DLCO cor % pred: 61 %
DLCO cor: 19.04 ml/min/mmHg
DLCO unc % pred: 59 %
DLCO unc: 18.48 ml/min/mmHg
FEF 25-75 Post: 1.43 L/sec
FEF 25-75 Pre: 1.26 L/sec
FEF2575-%Change-Post: 13 %
FEF2575-%Pred-Post: 84 %
FEF2575-%Pred-Pre: 74 %
FEV1-%Change-Post: 0 %
FEV1-%Pred-Post: 79 %
FEV1-%Pred-Pre: 79 %
FEV1-Post: 2.06 L
FEV1-Pre: 2.06 L
FEV1FVC-%Change-Post: 4 %
FEV1FVC-%Pred-Pre: 97 %
FEV6-%Change-Post: -7 %
FEV6-%Pred-Post: 79 %
FEV6-%Pred-Pre: 85 %
FEV6-Post: 2.73 L
FEV6-Pre: 2.95 L
FEV6FVC-%Change-Post: 1 %
FEV6FVC-%Pred-Post: 107 %
FEV6FVC-%Pred-Pre: 105 %
FVC-%Change-Post: -4 %
FVC-%Pred-Post: 77 %
FVC-%Pred-Pre: 80 %
FVC-Post: 2.85 L
FVC-Pre: 2.99 L
Post FEV1/FVC ratio: 72 %
Post FEV6/FVC ratio: 100 %
Pre FEV1/FVC ratio: 69 %
Pre FEV6/FVC Ratio: 99 %
RV % pred: 96 %
RV: 2.6 L
TLC % pred: 83 %
TLC: 5.74 L

## 2013-05-16 LAB — PROTIME-INR
INR: 1.25 (ref 0.00–1.49)
Prothrombin Time: 15.4 seconds — ABNORMAL HIGH (ref 11.6–15.2)

## 2013-05-16 MED ORDER — AMIODARONE HCL 200 MG PO TABS
400.0000 mg | ORAL_TABLET | Freq: Two times a day (BID) | ORAL | Status: DC
  Administered 2013-05-16 – 2013-05-18 (×5): 400 mg via ORAL
  Filled 2013-05-16 (×5): qty 2

## 2013-05-16 NOTE — Progress Notes (Addendum)
       [address]       [place] [address]             [phone]          4 Days Post-Op Procedure(s) (LRB): CORONARY ARTERY BYPASS GRAFTING (CABG) (N/A) AORTIC VALVE REPLACEMENT (AVR) (N/A) INTRAOPERATIVE TRANSESOPHAGEAL ECHOCARDIOGRAM (N/A) MAZE (N/A)  Subjective: Sore in chest, otherwise doing well.     Objective: Vital signs in last 24 hours: Patient Vitals for the past 24 hrs:  BP Temp Temp src Pulse Resp SpO2 Weight  05/16/13 0612 102/69 mmHg 98.6 F (37 C) Oral 119 18 96 % -  05/16/13 0500 - - - - - - 183 lb 9.6 oz (83.28 kg)  05/15/13 2102 - - - 125 - - -  05/15/13 2046 94/57 mmHg 99.3 F (37.4 C) Oral 118 20 96 % -  05/15/13 1300 94/56 mmHg 98.2 F (36.8 C) Oral 90 19 97 % -   Current Weight  05/16/13 183 lb 9.6 oz (83.28 kg)  PRE-OPERATIVE WEIGHT: 77.4 kg    Intake/Output from previous day: 03/15 0701 - 03/16 0700 In: 243 [P.O.:240; I.V.:3] Out: 1200 [Urine:1200]  CBGs 112-163-127   PHYSICAL EXAM:  Heart: Irr irr, HR 100-120s Lungs: Diminished BS in bases Wound: Clean and dry Extremities: Mild LE edema    Lab Results: CBC: Recent Labs  05/14/13 0430 05/15/13 1335  WBC 10.7* 9.9  HGB 10.7* 10.6*  HCT 31.3* 30.3*  PLT 71* 94*   BMET:  Recent Labs  05/14/13 0430 05/15/13 1335  NA 134* 135*  K 4.3 4.0  CL 99 98  CO2 25 26  GLUCOSE 142* 163*  BUN 12 17  CREATININE 0.65 0.64  CALCIUM 8.6 8.4    PT/INR:  Recent Labs  05/16/13 0403  LABPROT 15.4*  INR 1.25      Assessment/Plan: S/P Procedure(s) (LRB): CORONARY ARTERY BYPASS GRAFTING (CABG) (N/A) AORTIC VALVE REPLACEMENT (AVR) (N/A) INTRAOPERATIVE TRANSESOPHAGEAL ECHOCARDIOGRAM (N/A) MAZE (N/A)  CV- AF, HRs elevated at times, SBPs low normal.  Will increase po Amiodarone to 400 mg bid, continue Dig, Lopressor, Coumadin.  Vol overload- diurese.  CBGs stable, A1C=5.7.   D/c Foley today and monitor.  CRPI, pulm toilet.   LOS: 10 days     COLLINS,GINA H 05/16/2013  Patient in A flutter Attempted RAP w/o conversion Cont coumadin and current meds Foley still in- DC early am

## 2013-05-16 NOTE — Progress Notes (Signed)
DC foley in am 05/17/13 per MDs verbal order.  Carollee Sires, RN

## 2013-05-16 NOTE — Progress Notes (Signed)
CARDIAC REHAB PHASE I   PRE:  Rate/Rhythm: 122 afib/ some ST  BP:  Supine: 116/86  Sitting:   Standing:    SaO2: 97%RA  MODE:  Ambulation: 350 ft   POST:  Rate/Rhythm: 130 afib  BP:  Supine:   Sitting: 137/88  Standing:    SaO2: 96%RA 1115-1140 Pt walked 350 ft on RA with rolling walker with fairly steady gait. Needs to keep feet within walker. Tolerated well except heart rate elevated. No complaints. To recliner with call bell.   Graylon Good, RN BSN  05/16/2013 11:37 AM

## 2013-05-17 ENCOUNTER — Encounter (HOSPITAL_COMMUNITY): Payer: Self-pay | Admitting: Cardiothoracic Surgery

## 2013-05-17 LAB — COMPREHENSIVE METABOLIC PANEL
ALT: 38 U/L (ref 0–53)
AST: 30 U/L (ref 0–37)
Albumin: 2.9 g/dL — ABNORMAL LOW (ref 3.5–5.2)
Alkaline Phosphatase: 50 U/L (ref 39–117)
BUN: 16 mg/dL (ref 6–23)
CO2: 28 mEq/L (ref 19–32)
Calcium: 8.7 mg/dL (ref 8.4–10.5)
Chloride: 100 mEq/L (ref 96–112)
Creatinine, Ser: 0.65 mg/dL (ref 0.50–1.35)
GFR calc Af Amer: >90 mL/min (ref >90)
GFR calc non Af Amer: 88 mL/min — ABNORMAL LOW (ref >90)
Glucose, Bld: 113 mg/dL — ABNORMAL HIGH (ref 70–99)
Potassium: 3.8 mEq/L (ref 3.7–5.3)
Sodium: 140 mEq/L (ref 137–147)
Total Bilirubin: 0.8 mg/dL (ref 0.3–1.2)
Total Protein: 6.2 g/dL (ref 6.0–8.3)

## 2013-05-17 LAB — PROTIME-INR
INR: 1.26 (ref 0.00–1.49)
Prothrombin Time: 15.5 seconds — ABNORMAL HIGH (ref 11.6–15.2)

## 2013-05-17 MED ORDER — DIGOXIN 250 MCG PO TABS
0.2500 mg | ORAL_TABLET | Freq: Every day | ORAL | Status: DC
  Administered 2013-05-17 – 2013-05-18 (×2): 0.25 mg via ORAL
  Filled 2013-05-17 (×2): qty 1

## 2013-05-17 NOTE — Progress Notes (Signed)
CARDIAC REHAB PHASE I   PRE:  Rate/Rhythm: 96 Afib  BP:  Supine:   Sitting: 106/60  Standing:    SaO2: 98 RA  MODE:  Ambulation: 550 ft   POST:  Rate/Rhythm: 114 Afib  BP:  Supine:   Sitting: 150/93  Standing:    SaO2: 97 RA 1355-1440 Assisted X 1 and used walker to ambulate. Gait steady with walker. Pt able to walk 550 feet. He does c/o of being tired by end of walk. HR after walk 114 Afib.Pt to bed after walk with call light in reach. Discussed Outpt. CRP with pt, he agrees to referral to [facility] [address] I will send referral. I put recoery from heart surgey on for pt and wife to watch. They are interested in a walker and 3 in 1 for home use, will ask case manager to see.  Rodney Langton RN 05/17/2013 2:32 PM

## 2013-05-17 NOTE — Progress Notes (Addendum)
       [address]       [place] [address]             [phone]          5 Days Post-Op Procedure(s) (LRB): CORONARY ARTERY BYPASS GRAFTING (CABG) (N/A) AORTIC VALVE REPLACEMENT (AVR) (N/A) INTRAOPERATIVE TRANSESOPHAGEAL ECHOCARDIOGRAM (N/A) MAZE (N/A)  Subjective: Feels well. Voiding without problem since catheter removed. No complaints.   Objective: Vital signs in last 24 hours: Patient Vitals for the past 24 hrs:  BP Temp Temp src Pulse Resp SpO2 Weight  05/17/13 0513 108/65 mmHg 98.9 F (37.2 C) Oral 113 20 95 % -  05/17/13 0500 - - - - - - 183 lb (83.008 kg)  05/16/13 2117 101/64 mmHg 99.1 F (37.3 C) Oral 114 18 96 % -  05/16/13 1500 101/61 mmHg 98.7 F (37.1 C) Oral 109 20 96 % -  05/16/13 1116 116/86 mmHg - - 123 - - -  05/16/13 7824 - - - 95 - - -   Current Weight  05/17/13 183 lb (83.008 kg)  PRE-OPERATIVE WEIGHT: 77.4 kg    Intake/Output from previous day: 03/16 0701 - 03/17 0700 In: 840 [P.O.:840] Out: 650 [Urine:650]    PHYSICAL EXAM:  Heart: Irr irr Lungs: Clear Wound: Clean and dry Extremities: No significant LE edema    Lab Results: CBC: Recent Labs  05/15/13 1335  WBC 9.9  HGB 10.6*  HCT 30.3*  PLT 94*   BMET:  Recent Labs  05/15/13 1335 05/17/13 0555  NA 135* 140  K 4.0 3.8  CL 98 100  CO2 26 28  GLUCOSE 163* 113*  BUN 17 16  CREATININE 0.64 0.65  CALCIUM 8.4 8.7    PT/INR:  Recent Labs  05/17/13 0555  LABPROT 15.5*  INR 1.26      Assessment/Plan: S/P Procedure(s) (LRB): CORONARY ARTERY BYPASS GRAFTING (CABG) (N/A) AORTIC VALVE REPLACEMENT (AVR) (N/A) INTRAOPERATIVE TRANSESOPHAGEAL ECHOCARDIOGRAM (N/A) MAZE (N/A)  CV- AF, rates elevated with exertion. BPs stable. Continue Amio, Lopressor, Coumadin, increase Dig and watch.  Vol overload- Continue diuresis.  GU- Voiding well with Foley out.  CRPI, pulm toilet.  Possibly home 1-2 days if HR better controlled.   LOS: 11 days      COLLINS,GINA H 05/17/2013  Home withn HHN for restorative casre and INR draws to coumadin clinic ( Dr Claiborne Billings)

## 2013-05-17 NOTE — Discharge Summary (Signed)
[address]       [place] [address]             [phone]       Sonya E Dashner 12-26-1927 80 y.o. [id number]  05/06/2013   Ivin Poot, MD  Aortic stenosis [424.1] CHF (congestive heart failure) [428.0] DOE (dyspnea on exertion) [786.09] Acute CHF [428.0] Atrial fibrillation with rapid ventricular response [427.31]  HPI:at time of admission: Mr. Lundin is an 80 y/o M with history of PAF, CAD (cath 04/2009: BMS to LCx 04/2009; CTO of RCA with L-R collaterals, 40% ostial diag), mild AS by cath 04/2009, remote lower GIB 05/2009 due to esophagitis/ulcer without recurrence, prostate CA, HTN, polymyalgia rheumatica who presented to [facility] with atrial fibrillation and symptoms of CHF.  He was recently seen in the office 04/21/2013 at which time he was back in atrial fib. Metoprolol was increased from 25 to 50mg . Eliquis was started at 5mg  BID with plan for possible cardioversion in a month if atrial fib persisted. (His medicine list included Benicar HCT although he has not been taking this for 6 months per PCP due to softer BPs.) He saw his PCP about a week ago at which time the following changes were made: 1) increase Toprol to 100 due to persistently elevated HR and 2) change to Coumadin due to cost of Eliquis ($800/mo per pt), INR being managed by their office. He saw his PCP in followup again yesterday at which time HR was still poorly controlled. Metoprolol was discontinued and he was changed to Diltiazem 60mg  TID. Over the last two weeks he's had progressive SOB/DOE, orthopnea, abdominal distention, and LEE. In the ER, he is in rapid afib with HR 120s-140s. Got 5mg  IV Lopressor around 8 but HR still 130s. Received 60mg  IV Lasix and beginning to ++urinate. CXR: patchy acute basilar interstitial pulmonary opacity, suspect small effusions; ddx includes pulm edema vs atypical PNA. He is afebrile with normal WBC, intitial troponin neg, pBNP 1400. INR  only 1.42. Denies chest pain or chest discomfort, syncope, nausea, bleeding, history of TIA or stroke. He actually was scheduled for f/u echo in our office today. He presented to the emergency department in acute CHF in the setting of poorly controlled atrial fibrillation and was felt to require admission to the SDU for diltiazem for rate control. Additionally he needed intravenous Lasix and was started on a heparin drip. Patient was admitted for further evaluation and treatment to include possible TEE and DC cardioversion.  Past Medical History   Diagnosis  Date   .  Hypertension    .  Prostate cancer    .  Hyperlipidemia    .  Carpal tunnel syndrome      bilateral, carpal tunnel release x 2   .  PAF (paroxysmal atrial fibrillation)    .  Polymyalgia rheumatica    .  GERD (gastroesophageal reflux disease)      EGD, Dr. Toney Rakes 2008   .  Esophagitis      a. 05/2009 a/w GIB.   [name]  Esophageal ulcer      a. 05/2009 with hemorrhage - injected with epinephrine-Dr. Herbie Baltimore Buccini.   .  Vitamin D deficiency    .  Blood loss anemia      a. 05/2009 a/w GIB.   [name]  Hx of colonoscopy  2008     Dr. Toney Rakes, polyps- 5 yr f/u recommended   .  Perforated ear drum  right ear drum, tube in left ear drum, Dr. Constance Holster   .  GI bleed      a. 05/2009 as above: with associated anemia. Lower esophageal ulcer with extensive erosive esophagitis and large clot by EGD 05/2009.   [name]  CAD (coronary artery disease)      a. Cath 04/2009: BMS to LCx 04/2009; CTO of RCA with L-R collaterals, 40% ostial diag.   .  Aortic stenosis      a. Mild by cath 04/2009.    Surgical History:  Past Surgical History   Procedure  Laterality  Date   .  Cardiac catheterization   2011   .  Prostatectomy      Home Meds:  Prior to Admission medications   Medication  Sig  Start Date  End Date  Taking?  Authorizing Provider   cholecalciferol (VITAMIN D) 1000 UNITS tablet  Take 1,000 Units by mouth daily.    Yes  Historical  Provider, MD   diltiazem (CARDIZEM) 60 MG tablet  Take 60 mg by mouth 3 (three) times daily.    Yes  Historical Provider, MD   furosemide (LASIX) 20 MG tablet  Take 20 mg by mouth daily.    Yes  Historical Provider, MD   Omega-3 Fatty Acids (FISH OIL) 1000 MG CAPS  Take 2 capsules by mouth daily.    Yes  Historical Provider, MD   omeprazole (PRILOSEC) 20 MG capsule  Take 20 mg by mouth daily.    Yes  Historical Provider, MD   potassium chloride SA (K-DUR,KLOR-CON) 20 MEQ tablet  Take 20 mEq by mouth daily.    Yes  Historical Provider, MD   simvastatin (ZOCOR) 40 MG tablet  Take 40 mg by mouth daily.    Yes  Historical Provider, MD   warfarin (COUMADIN) 5 MG tablet  Take 5 mg by mouth daily.    Yes  Historical Provider, MD   Allergies:  Allergies   Allergen  Reactions   .  Lisinopril  Cough   .  Kenalog [Triamcinolone Acetonide]  Rash    History    Social History   .  Marital Status:  Married     Spouse Name:  N/A     Number of Children:  N/A   .  Years of Education:  N/A    Occupational History   .  Not on file.    Social History Main Topics   .  Smoking status:  Never Smoker   .  Smokeless tobacco:  Not on file   .  Alcohol Use:  No   .  Drug Use:  No   .  Sexual Activity:  Not on file    Other Topics  Concern   .  Not on file    Social History Narrative   .  No narrative on file    Family History   Problem  Relation  Age of Onset   .  CVA  Mother    .  Kidney disease  Father    .  Breast cancer  Daughter        Hospital Course:  The patient was admitted and underwent diagnostic evaluation including echocardiogram which revealed severe aortic stenosis. Cardiac catheterization was also done which revealed some right coronary stenosis as well. Cardiothoracic surgical consultation was obtained with Tharon Aquas Trigt MD who evaluated the patient and studies and agree recommendations to proceed with aortic valve replacement with Maze procedure and or single-vessel coronary  bypass grafting. The patient was medically stabilized and was felt to be ready for surgery on 05/12/2013. He was taken the operating room and underwent the following procedure:  DATE OF PROCEDURE: 05/12/2013  DATE OF DISCHARGE:  OPERATIVE REPORT  OPERATION:  1. Aortic valve replacement using a 23 mm pericardial Magna Ease valve  (serial [id number]).  2. Left atrial Maze procedure using radiofrequency ablation encircling  right and left-sided pulmonary veins, left atrial appendage, and  box set lesions of the floor of the left atrium.  3. Coronary artery bypass grafting times 1-2, saphenous vein graft to  RV marginal.  4. Left atrial closure using the left atrial clip 3.5 cm.  5. Endoscopic harvest of left leg greater saphenous vein.  SURGEON: Ivin Poot, M.D.  ASSISTANT: Suzzanne Cloud, PA-C  PREOPERATIVE DIAGNOSES:  1. Severe aortic stenosis with class IV congestive heart failure  2. Refractory rapid atrial fibrillation with increased heart rate.  3. Chronic total occlusion of the right coronary artery.  POSTOPERATIVE DIAGNOSES:  1. Severe aortic stenosis with class IV congestive heart failure  2. Refractory rapid atrial fibrillation with increased heart rate.  3. Chronic total occlusion of the right coronary artery.  ANESTHESIA: General by Dr. Laurie Panda.  Postoperative hospital course: Overall the patient has progressed nicely. He was extubated without difficulty. He is neurologically intact. He has moderate postoperative volume overload but has responded to diuretics. The Foley was difficult to place preoperatively but since removal he is voiding well. He has had postoperative atrial fibrillation and has been started on amiodarone and digoxin. Rates are increased with exertion. He has been started on Coumadin. Incisions are healing well without evidence of infection. He is tolerating gradually increasing activities using standard protocols. Overall the patient's status is felt to be  tentatively stable for discharge in the next 24-48 hours pending ongoing reevaluation of his recovery.    Recent Labs  05/15/13 1335 05/17/13 0555  NA 135* 140  K 4.0 3.8  CL 98 100  CO2 26 28  GLUCOSE 163* 113*  BUN 17 16  CALCIUM 8.4 8.7    Recent Labs  05/15/13 1335  WBC 9.9  HGB 10.6*  HCT 30.3*  PLT 94*    Recent Labs  05/16/13 0403 05/17/13 0555  INR 1.25 1.26     Discharge Instructions:  The patient is discharged to home with extensive instructions on wound care and progressive ambulation.  They are instructed not to drive or perform any heavy lifting until returning to see the physician in his office.  Discharge Diagnosis:  Aortic stenosis [424.1] CHF (congestive heart failure) [428.0] DOE (dyspnea on exertion) [786.09] Acute CHF [428.0] Atrial fibrillation with rapid ventricular response [427.31]  Secondary Diagnosis: Patient Active Problem List   Diagnosis Date Noted  . S/P AVR 05/12/2013  . Acute CHF 05/06/2013  . Atrial fibrillation with RVR 05/06/2013  . Aortic stenosis   . Coronary atherosclerosis of native coronary artery 04/21/2013  . Essential hypertension, benign 04/21/2013  . Other and unspecified hyperlipidemia 04/21/2013  . Reflux esophagitis 04/21/2013  . PAF (paroxysmal atrial fibrillation) 04/21/2013   Past Medical History  Diagnosis Date  . Hypertension   . Prostate cancer   . Hyperlipidemia   . Carpal tunnel syndrome     bilateral, carpal tunnel release x 2  . PAF (paroxysmal atrial fibrillation)   . Polymyalgia rheumatica   . GERD (gastroesophageal reflux disease)     EGD, Dr. Toney Rakes 2008  . Esophagitis  a. 05/2009 a/w GIB.  [name] Esophageal ulcer     a. 05/2009 with hemorrhage - injected with epinephrine-Dr. Herbie Baltimore Buccini.  . Vitamin D deficiency   . Blood loss anemia     a. 05/2009 a/w GIB.  [name] Hx of colonoscopy 2008    Dr. Toney Rakes, polyps- 5 yr f/u recommended  . Perforated ear drum     right ear  drum, tube in left ear drum, Dr. Constance Holster  . GI bleed     a. 05/2009 as above: with associated anemia. Lower esophageal ulcer with extensive erosive esophagitis and large clot by EGD 05/2009.   [name] CAD (coronary artery disease)     a. Cath 04/2009: BMS to LCx 04/2009; CTO of RCA with L-R collaterals, 40% ostial diag.  . Aortic stenosis     a. Mild by cath 04/2009.      Medication list at discharge:   Medication List    STOP taking these medications       diltiazem 60 MG tablet  Commonly known as:  CARDIZEM      TAKE these medications       amiodarone 400 MG tablet  Commonly known as:  PACERONE  Take 1 tablet (400 mg total) by mouth 2 (two) times daily. X 1 week, then 200 mg po bid     aspirin 81 MG EC tablet  Take 1 tablet (81 mg total) by mouth daily.     cholecalciferol 1000 UNITS tablet  Commonly known as:  VITAMIN D  Take 1,000 Units by mouth daily.     digoxin 0.25 MG tablet  Commonly known as:  LANOXIN  Take 1 tablet (0.25 mg total) by mouth daily.     Fish Oil 1000 MG Caps  Take 2 capsules by mouth daily.     furosemide 40 MG tablet  Commonly known as:  LASIX  Take 1 tablet (40 mg total) by mouth daily. X 1 week     metoprolol tartrate 25 MG tablet  Commonly known as:  LOPRESSOR  Take 1 tablet (25 mg total) by mouth 2 (two) times daily.     omeprazole 20 MG capsule  Commonly known as:  PRILOSEC  Take 20 mg by mouth daily.     potassium chloride SA 20 MEQ tablet  Commonly known as:  K-DUR,KLOR-CON  Take 1 tablet (20 mEq total) by mouth daily. X 1 week     simvastatin 40 MG tablet  Commonly known as:  ZOCOR  Take 40 mg by mouth daily.     traMADol 50 MG tablet  Commonly known as:  ULTRAM  Take 1 tablet (50 mg total) by mouth every 6 (six) hours as needed for moderate pain.     warfarin 2.5 MG tablet  Commonly known as:  COUMADIN  Take 1 tablet (2.5 mg total) by mouth daily. Or as directed by Coumadin clinic        The patient has been discharged  on:   1.Beta Blocker:  Yes [ x  ]                              No   [   ]                              If No, reason:  2.Ace Inhibitor/ARB: Yes [   ]  No  [x    ]                                     If No, reason: allergy  3.Statin:   Yes [ x  ]                  No  [   ]                  If No, reason:  4.Ecasa:  Yes  [  x ]                  No   [   ]                  If No, reason:    Jadene Pierini, PA-C 05/17/2013  8:47 AM

## 2013-05-17 NOTE — Discharge Instructions (Signed)
Atrial Fibrillation Atrial fibrillation is a condition that causes your heart to beat irregularly. It may also cause your heart to beat faster than normal. Atrial fibrillation can prevent your heart from pumping blood normally. It increases your risk of stroke and heart problems. HOME CARE  Take medications as told by your doctor.  Only take medications that your doctor says are safe. Some medications can make the condition worse or happen again.  If blood thinners were prescribed by your doctor, take them exactly as told. Too much can cause bleeding. Too little and you will not have the needed protection against stroke and other problems.  Perform blood tests at home if told by your doctor.  Perform blood tests exactly as told by your doctor.  Do not drink alcohol.  Do not drink beverages with caffeine such as coffee, soda, and some teas.  Maintain a healthy weight.  Do not use diet pills unless your doctor says they are safe. They may make heart problems worse.  Follow diet instructions as told by your doctor.  Exercise regularly as told by your doctor.  Keep all follow-up appointments. GET HELP RIGHT AWAY IF:   You have chest or belly (abdominal) pain.  You feel sick to your stomach (nauseous)  You suddenly have swollen feet and ankles.  You feel dizzy.  You face, arms, or legs feel numb or weak.  There is a change in your vision or speech.  You notice a change in the speed, rhythm, or strength of your heartbeat.  You suddenly begin peeing (urinating) more often.  You get tired more easily when moving or exercising. MAKE SURE YOU:   Understand these instructions.  Will watch your condition.  Will get help right away if you are not doing well or get worse. Document Released: 11/27/2007 Document Revised: 06/14/2012 Document Reviewed: 03/30/2012 [facility] Patient Information 2014 [place]. Coronary Artery Bypass Grafting, Care After Refer to this sheet in  the next few weeks. These instructions provide you with information on caring for yourself after your procedure. Your health care provider may also give you more specific instructions. Your treatment has been planned according to current medical practices, but problems sometimes occur. Call your health care provider if you have any problems or questions after your procedure. WHAT TO EXPECT AFTER THE PROCEDURE Recovery from surgery will be different for everyone. Some people feel well after 3 or 4 weeks, while for others it takes longer. After your procedure, it is typical to have the following:  Nausea and a lack of appetite.   Constipation.  Weakness and fatigue.   Depression or irritability.   Pain or discomfort at your incision site. HOME CARE INSTRUCTIONS  Only take over-the-counter or prescription medicines as directed by your health care provider. Take all medicines exactly as directed. Do not stop taking medicines or start any new medicines without first checking with your health care provider.   Take your pulse as directed by your health care provider.  Perform deep breathing as directed by your health care provider. If you were given a device called an incentive spirometer, use it to practice deep breathing several times a day. Support your chest with a pillow or your arms when you take deep breaths or cough.  Keep incision areas clean, dry, and protected. Remove or change any bandages (dressings) only as directed by your health care provider. You may have skin adhesive strips over the incision areas. Do not take the strips off. They will fall  off on their own.  Check incision areas daily for any swelling, redness, or drainage.  If incisions were made in your legs, do the following:  Avoid crossing your legs.   Avoid sitting for long periods of time. Change positions every 30 minutes.   Elevate your legs when you are sitting.   Wear compression stockings as directed  by your health care provider. These stockings help keep blood clots from forming in your legs.  Take showers once your health care provider approves. Until then, only take sponge baths. Pat incisions dry. Do not rub incisions with a washcloth or towel. Do not take tub baths or go swimming until your health care provider approves.  Eat foods that are high in fiber, such as raw fruits and vegetables, whole grains, beans, and nuts. Meats should be lean cut. Avoid canned, processed, and fried foods.  Drink enough fluids to keep your urine clear or pale yellow.  Weigh yourself every day. This helps identify if you are retaining fluid that may make your heart and lungs work harder.   Rest and limit activity as directed by your health care provider. You may be instructed to:  Stop any activity at once if you have chest pain, shortness of breath, irregular heartbeats, or dizziness. Get help right away if you have any of these symptoms.  Move around frequently for short periods or take short walks as directed by your health care provider. Increase your activities gradually. You may need physical therapy or cardiac rehabilitation to help strengthen your muscles and build your endurance.  Avoid lifting, pushing, or pulling anything heavier than 10 lb (4.5 kg) for at least 6 weeks after surgery.  Do not drive until your health care provider approves.  Ask your health care provider when you may return to work and resume sexual activity.  Follow up with your health care provider as directed.  SEEK MEDICAL CARE IF:  You have swelling, redness, increasing pain, or drainage at the site of an incision.   You develop a fever.   You have swelling in your ankles or legs.   You have pain in your legs.   You have weight gain of 2 or more pounds a day.  You are nauseous or vomit.  You have diarrhea. SEEK IMMEDIATE MEDICAL CARE IF:  You have chest pain that goes to your jaw or arms.  You  have shortness of breath.   You have a fast or irregular heartbeat.   You notice a "clicking" in your breastbone (sternum) when you move.   You have numbness or weakness in your arms or legs.  You feel dizzy or lightheaded.  MAKE SURE YOU:  Understand these instructions.  Will watch your condition.  Will get help right away if you are not doing well or get worse. Document Released: 09/06/2004 Document Revised: 10/20/2012 Document Reviewed: 07/27/2012 [facility] Patient Information 2014 [place]. Aortic Valve Replacement Care After Refer to this sheet in the next few weeks. These instructions provide you with information on caring for yourself after your procedure. Your caregiver may also give you specific instructions. Your treatment has been planned according to current medical practices, but problems sometimes occur. Call your caregiver if you have any problems or questions after your procedure. HOME CARE INSTRUCTIONS   Only take over-the-counter or prescription medicines as directed by your caregiver.  Take your temperature every morning for the first 7 days after surgery. Write these down. Call your caregiver if your temperature stays  above 100 F (37.8 C) for more than a day.   Weigh yourself every morning for at least 7 days after surgery. Write your weight down to monitor any weight increase.  Wear elastic stockings during the day for at least 2 weeks after surgery. Use them longer if your ankles are swollen. The stockings help blood flow and help reduce swelling in the legs.  Take frequent naps or rest often throughout the day.  Avoid lifting over 10 lbs (4.5 kg) or pushing or pulling things with your arms for 6 8 weeks or as directed by your caregiver.  Avoid driving or airplane travel for 4 6 weeks after surgery or as directed. If you are riding in a car for an extended period, stop every 1 2 hours to stretch your legs. Keep a record of your medicines and  medical history with you when traveling.  Do not cross your legs.  Do not take baths for 4 6 weeks after surgery. Take showers once your caregiver approves. Pat incisions dry. Do not rub incisions with a washcloth or towel.  Avoid climbing stairs and using the handrail to pull yourself up for the first 2 3 weeks after surgery.  Return to work as directed by your caregiver.  Drink enough fluids to keep your urine clear or pale yellow.  Do not strain to have a bowel movement. Eat high-fiber foods if you become constipated. You may also take a medicine to help you have a bowel movement (laxative) as directed by your caregiver.  Resume sexual activity as directed by your caregiver. Men should not use medicines for erectile dysfunction until their doctor says it isokay.  If you had a certain type of heart condition in the past, you may need to take antibiotic medicine before having dental work or surgery. Let your dentist and caregivers know if you had one or more of the following:  Previous endocarditis.  An artificial (prosthetic) heart valve.  Congenital heart disease. SEEK MEDICAL CARE IF:  You develop a skin rash.   Your weight is increasing each day over 2 3 days, or you have a sudden weight gain.  Your weight increases by 2 or more pounds (1 kg or more) in a single day. SEEK IMMEDIATE MEDICAL CARE IF:   You develop chest pain that is not coming from your incision.   You develop shortness of breath or have difficulty breathing.   You have a fever.   You have increased bleeding from your wounds.   You have increasing wound pain.   You have redness or swelling around your wounds  You have pus coming from your wound.   You develop lightheadedness.  MAKE SURE YOU:   Understand these directions.  Will watch your condition.  Will get help right away if you are not doing well or get worse. Document Released: 09/05/2004 Document Revised: 02/04/2012 Document  Reviewed: 12/02/2011 [facility] Patient Information 2014 [place], [place].

## 2013-05-17 NOTE — Progress Notes (Signed)
REMOVED PT FOLEY CATHETER THIS AM. CATH TIP INTACT (16 COUDE) 10CC WATER REMOVED FROM BALLOON. PT TOLERATED WELL.

## 2013-05-18 LAB — PROTIME-INR
INR: 1.23 (ref 0.00–1.49)
Prothrombin Time: 15.2 seconds (ref 11.6–15.2)

## 2013-05-18 MED ORDER — FUROSEMIDE 40 MG PO TABS: 40.0000 mg | ORAL_TABLET | Freq: Every day | ORAL | Status: DC

## 2013-05-18 MED ORDER — TRAMADOL HCL 50 MG PO TABS: 50.0000 mg | ORAL_TABLET | Freq: Four times a day (QID) | ORAL | Status: DC | PRN

## 2013-05-18 MED ORDER — METOPROLOL TARTRATE 25 MG PO TABS: 25.0000 mg | ORAL_TABLET | Freq: Two times a day (BID) | ORAL | Status: DC

## 2013-05-18 MED ORDER — ASPIRIN 81 MG PO TBEC: 81.0000 mg | DELAYED_RELEASE_TABLET | Freq: Every day | ORAL | Status: DC

## 2013-05-18 MED ORDER — DIGOXIN 250 MCG PO TABS: 0.2500 mg | ORAL_TABLET | Freq: Every day | ORAL | Status: DC

## 2013-05-18 MED ORDER — AMIODARONE HCL 400 MG PO TABS: 400.0000 mg | ORAL_TABLET | Freq: Two times a day (BID) | ORAL | Status: DC

## 2013-05-18 MED ORDER — POTASSIUM CHLORIDE CRYS ER 20 MEQ PO TBCR: 20.0000 meq | EXTENDED_RELEASE_TABLET | Freq: Every day | ORAL | Status: DC

## 2013-05-18 MED ORDER — WARFARIN SODIUM 2.5 MG PO TABS: 2.5000 mg | ORAL_TABLET | Freq: Every day | ORAL | Status: DC

## 2013-05-18 NOTE — Progress Notes (Signed)
Removed epicardial wires per order. 3 intact.  Pt tolerated procedure well.  Pt instructed to remain on bedrest for one hour.  Frequent vitals will be taken and documented. Pt resting with call bell within reach. Miyani Cronic McClintock   

## 2013-05-18 NOTE — Progress Notes (Signed)
Removed CT sutures and applied benzoin and 1/2 " steri strips.  Pt tolerated procedure well. Pt given signs and symptoms of infection. Pt resting with call bell within reach.  Will continue to monitor. Sereniti Wan McClintock, RN     

## 2013-05-18 NOTE — Progress Notes (Signed)
Pt/family given discharge instructions, medication lists, follow up appointments, and when to call the doctor.  Pt/family verbalizes understanding. Pt tried to get up while I was giving paperwork to wife.  Pt rolled forward and fell onto right hand.  Pt has skin tear to knuckle of right ring finger.  Applied pressure and allevyn dressing to area.  Pt that his shoe stuck to floor. Safety zone to be submitted.  Vitals taken and MD called. Pt resting with call bell within reach.  Will continue to monitor. Robert Emerald, RN

## 2013-05-18 NOTE — Progress Notes (Signed)
[id number] Cardiac Rehab Completed discharge education with pt and wife. They voice understanding.They watched discharge video yesterday. Deon Pilling, RN 05/18/2013 10:02 AM

## 2013-05-18 NOTE — Progress Notes (Addendum)
       [address]       [place] [address]             [phone]          6 Days Post-Op Procedure(s) (LRB): CORONARY ARTERY BYPASS GRAFTING (CABG) (N/A) AORTIC VALVE REPLACEMENT (AVR) (N/A) INTRAOPERATIVE TRANSESOPHAGEAL ECHOCARDIOGRAM (N/A) MAZE (N/A)  Subjective: Had some chest soreness and shortness of breath after walking last night, but feels better this am.    Objective: Vital signs in last 24 hours: Patient Vitals for the past 24 hrs:  BP Temp Temp src Pulse Resp SpO2 Weight  05/18/13 0448 122/76 mmHg 97.8 F (36.6 C) Oral - 18 96 % -  05/18/13 0442 - - - - - - 186 lb 1.1 oz (84.4 kg)  05/17/13 1956 147/89 mmHg 98.1 F (36.7 C) Oral - 18 96 % -  05/17/13 1319 115/59 mmHg 97.9 F (36.6 C) Oral 94 18 100 % -  05/17/13 0950 112/70 mmHg - - 115 - - -   Current Weight  05/18/13 186 lb 1.1 oz (84.4 kg)  PRE-OPERATIVE WEIGHT: 77.4 kg    Intake/Output from previous day: 03/17 0701 - 03/18 0700 In: 600 [P.O.:600] Out: 2201 [Urine:2200; Stool:1]    PHYSICAL EXAM:  Heart: Irr irr Lungs: Clear Wound: Clean and dry Extremities: Mild LE edema    Lab Results: CBC: Recent Labs  05/15/13 1335  WBC 9.9  HGB 10.6*  HCT 30.3*  PLT 94*   BMET:  Recent Labs  05/15/13 1335 05/17/13 0555  NA 135* 140  K 4.0 3.8  CL 98 100  CO2 26 28  GLUCOSE 163* 113*  BUN 17 16  CREATININE 0.64 0.65  CALCIUM 8.4 8.7    PT/INR:  Recent Labs  05/18/13 0242  LABPROT 15.2  INR 1.23      Assessment/Plan: S/P Procedure(s) (LRB): CORONARY ARTERY BYPASS GRAFTING (CABG) (N/A) AORTIC VALVE REPLACEMENT (AVR) (N/A) INTRAOPERATIVE TRANSESOPHAGEAL ECHOCARDIOGRAM (N/A) MAZE (N/A)  CV- stable AF/flutter. Continue Amio, Dig, Lopressor, Coumadin.  Vol overload- diurese.  D/c EPWs, plan home today. Instructions reviewed with pt and family.   LOS: 12 days    COLLINS,GINA H 05/18/2013  Oxygen saturation personally checked on room air  97% Incisions healing well chest x-ray satisfactory Atrial flutter with controlled heart rate Ready for discharge home, instructions reviewed with patient and family  patient examined and medical record reviewed,agree with above note. VAN TRIGT III,Lachele Lievanos 05/18/2013

## 2013-05-19 ENCOUNTER — Ambulatory Visit (INDEPENDENT_AMBULATORY_CARE_PROVIDER_SITE_OTHER): Payer: Medicare Other | Admitting: Pharmacist

## 2013-05-19 ENCOUNTER — Ambulatory Visit: Payer: BC Managed Care – PPO | Admitting: Interventional Cardiology

## 2013-05-19 ENCOUNTER — Other Ambulatory Visit: Payer: BC Managed Care – PPO

## 2013-05-19 DIAGNOSIS — I10 Essential (primary) hypertension: Secondary | ICD-10-CM | POA: Diagnosis not present

## 2013-05-19 DIAGNOSIS — I4891 Unspecified atrial fibrillation: Secondary | ICD-10-CM

## 2013-05-19 DIAGNOSIS — I5031 Acute diastolic (congestive) heart failure: Secondary | ICD-10-CM | POA: Diagnosis not present

## 2013-05-19 DIAGNOSIS — Z5181 Encounter for therapeutic drug level monitoring: Secondary | ICD-10-CM | POA: Diagnosis not present

## 2013-05-19 DIAGNOSIS — Z48812 Encounter for surgical aftercare following surgery on the circulatory system: Secondary | ICD-10-CM | POA: Diagnosis not present

## 2013-05-19 DIAGNOSIS — I509 Heart failure, unspecified: Secondary | ICD-10-CM | POA: Diagnosis not present

## 2013-05-19 DIAGNOSIS — I4819 Other persistent atrial fibrillation: Secondary | ICD-10-CM | POA: Insufficient documentation

## 2013-05-19 DIAGNOSIS — I2510 Atherosclerotic heart disease of native coronary artery without angina pectoris: Secondary | ICD-10-CM | POA: Diagnosis not present

## 2013-05-19 DIAGNOSIS — Z7901 Long term (current) use of anticoagulants: Secondary | ICD-10-CM | POA: Diagnosis not present

## 2013-05-19 DIAGNOSIS — M353 Polymyalgia rheumatica: Secondary | ICD-10-CM | POA: Diagnosis not present

## 2013-05-19 LAB — POCT INR: INR: 1.4

## 2013-05-19 NOTE — Discharge Summary (Signed)
patient examined and medical record reviewed,agree with above note. VAN TRIGT III,Albertha Beattie 05/19/2013

## 2013-05-21 DIAGNOSIS — M353 Polymyalgia rheumatica: Secondary | ICD-10-CM | POA: Diagnosis not present

## 2013-05-21 DIAGNOSIS — I4891 Unspecified atrial fibrillation: Secondary | ICD-10-CM | POA: Diagnosis not present

## 2013-05-21 DIAGNOSIS — I5031 Acute diastolic (congestive) heart failure: Secondary | ICD-10-CM | POA: Diagnosis not present

## 2013-05-21 DIAGNOSIS — Z48812 Encounter for surgical aftercare following surgery on the circulatory system: Secondary | ICD-10-CM | POA: Diagnosis not present

## 2013-05-21 DIAGNOSIS — I2510 Atherosclerotic heart disease of native coronary artery without angina pectoris: Secondary | ICD-10-CM | POA: Diagnosis not present

## 2013-05-21 DIAGNOSIS — I509 Heart failure, unspecified: Secondary | ICD-10-CM | POA: Diagnosis not present

## 2013-05-23 ENCOUNTER — Other Ambulatory Visit: Payer: BC Managed Care – PPO

## 2013-05-23 DIAGNOSIS — I5031 Acute diastolic (congestive) heart failure: Secondary | ICD-10-CM | POA: Diagnosis not present

## 2013-05-23 DIAGNOSIS — M353 Polymyalgia rheumatica: Secondary | ICD-10-CM | POA: Diagnosis not present

## 2013-05-23 DIAGNOSIS — Z48812 Encounter for surgical aftercare following surgery on the circulatory system: Secondary | ICD-10-CM | POA: Diagnosis not present

## 2013-05-23 DIAGNOSIS — I509 Heart failure, unspecified: Secondary | ICD-10-CM | POA: Diagnosis not present

## 2013-05-23 DIAGNOSIS — I4891 Unspecified atrial fibrillation: Secondary | ICD-10-CM | POA: Diagnosis not present

## 2013-05-23 DIAGNOSIS — I2510 Atherosclerotic heart disease of native coronary artery without angina pectoris: Secondary | ICD-10-CM | POA: Diagnosis not present

## 2013-05-25 ENCOUNTER — Ambulatory Visit (INDEPENDENT_AMBULATORY_CARE_PROVIDER_SITE_OTHER): Payer: Medicare Other | Admitting: Pharmacist

## 2013-05-25 DIAGNOSIS — I2510 Atherosclerotic heart disease of native coronary artery without angina pectoris: Secondary | ICD-10-CM | POA: Diagnosis not present

## 2013-05-25 DIAGNOSIS — M353 Polymyalgia rheumatica: Secondary | ICD-10-CM | POA: Diagnosis not present

## 2013-05-25 DIAGNOSIS — I4891 Unspecified atrial fibrillation: Secondary | ICD-10-CM

## 2013-05-25 DIAGNOSIS — I509 Heart failure, unspecified: Secondary | ICD-10-CM | POA: Diagnosis not present

## 2013-05-25 DIAGNOSIS — I5031 Acute diastolic (congestive) heart failure: Secondary | ICD-10-CM | POA: Diagnosis not present

## 2013-05-25 DIAGNOSIS — Z48812 Encounter for surgical aftercare following surgery on the circulatory system: Secondary | ICD-10-CM | POA: Diagnosis not present

## 2013-05-25 LAB — POCT INR: INR: 1.6

## 2013-05-25 NOTE — Telephone Encounter (Signed)
Spoke with pts wife and pt had a prescription for lasix 40 mg and potassium for 1 week. Pt was told to take these two for 1 week only. Pt has some LE swelling in legs and ankles. Pt is SOB if he walks some, but nothing unusual.

## 2013-05-25 NOTE — Telephone Encounter (Signed)
New message    In the home now .   Has a medication questions regarding lasix  40 mg daily.     Still have edema in lower leg. No more refills.

## 2013-05-26 DIAGNOSIS — I509 Heart failure, unspecified: Secondary | ICD-10-CM | POA: Diagnosis not present

## 2013-05-26 DIAGNOSIS — I4891 Unspecified atrial fibrillation: Secondary | ICD-10-CM | POA: Diagnosis not present

## 2013-05-26 DIAGNOSIS — M353 Polymyalgia rheumatica: Secondary | ICD-10-CM | POA: Diagnosis not present

## 2013-05-26 DIAGNOSIS — Z48812 Encounter for surgical aftercare following surgery on the circulatory system: Secondary | ICD-10-CM | POA: Diagnosis not present

## 2013-05-26 DIAGNOSIS — I5031 Acute diastolic (congestive) heart failure: Secondary | ICD-10-CM | POA: Diagnosis not present

## 2013-05-26 DIAGNOSIS — I2510 Atherosclerotic heart disease of native coronary artery without angina pectoris: Secondary | ICD-10-CM | POA: Diagnosis not present

## 2013-05-26 MED ORDER — FUROSEMIDE 40 MG PO TABS: ORAL_TABLET | ORAL | Status: DC

## 2013-05-26 MED ORDER — POTASSIUM CHLORIDE CRYS ER 20 MEQ PO TBCR: EXTENDED_RELEASE_TABLET | ORAL | Status: DC

## 2013-05-26 NOTE — Telephone Encounter (Signed)
Pt is aware to continue Lasix 40 mg and Potassium 20 mEq once daily prescription for these two medications sent to pt's pharmacy. Pt has an appointment for labs BMET and BNP for Friday 06/03/13 pt and wife aware  of MD's recommendations.

## 2013-05-26 NOTE — Progress Notes (Signed)
Pt/family given discharge instructions, medication lists, follow up appointments, and when to call the doctor.  Pt/family verbalizes understanding. Pt given signs and symptoms of infection. After giving instructions, I was helping wife to pack away instructions and pt attempted to stand using rocking motion.  Pt tumbled forward and had a skin tear on right ring knuckle.  Pt stated his shoes stuck to the floor and he could not get his balance. SZ and MD notified. Pt resting with call bell within reach.  Will continue to monitor. Pt discharged after assessed by Dr. Prescott Gum. Payton Emerald, RN

## 2013-05-26 NOTE — Telephone Encounter (Signed)
F/u    Pt's wife calling concerning previous message about pt's meds. Please advise

## 2013-05-26 NOTE — Telephone Encounter (Signed)
Continue lasix and potassium. BMet and BNP in one week.

## 2013-05-27 DIAGNOSIS — I2510 Atherosclerotic heart disease of native coronary artery without angina pectoris: Secondary | ICD-10-CM | POA: Diagnosis not present

## 2013-05-27 DIAGNOSIS — Z48812 Encounter for surgical aftercare following surgery on the circulatory system: Secondary | ICD-10-CM | POA: Diagnosis not present

## 2013-05-27 DIAGNOSIS — I509 Heart failure, unspecified: Secondary | ICD-10-CM | POA: Diagnosis not present

## 2013-05-27 DIAGNOSIS — I5031 Acute diastolic (congestive) heart failure: Secondary | ICD-10-CM | POA: Diagnosis not present

## 2013-05-27 DIAGNOSIS — I4891 Unspecified atrial fibrillation: Secondary | ICD-10-CM | POA: Diagnosis not present

## 2013-05-27 DIAGNOSIS — M353 Polymyalgia rheumatica: Secondary | ICD-10-CM | POA: Diagnosis not present

## 2013-05-27 MED ORDER — FUROSEMIDE 40 MG PO TABS: ORAL_TABLET | ORAL | Status: DC

## 2013-05-27 MED ORDER — POTASSIUM CHLORIDE CRYS ER 20 MEQ PO TBCR: EXTENDED_RELEASE_TABLET | ORAL | Status: DC

## 2013-05-27 NOTE — Addendum Note (Signed)
Addended byUlla Potash H on: 05/27/2013 07:22 AM   Modules accepted: Orders

## 2013-05-30 DIAGNOSIS — I509 Heart failure, unspecified: Secondary | ICD-10-CM | POA: Diagnosis not present

## 2013-05-30 DIAGNOSIS — Z48812 Encounter for surgical aftercare following surgery on the circulatory system: Secondary | ICD-10-CM | POA: Diagnosis not present

## 2013-05-30 DIAGNOSIS — I4891 Unspecified atrial fibrillation: Secondary | ICD-10-CM | POA: Diagnosis not present

## 2013-05-30 DIAGNOSIS — I2510 Atherosclerotic heart disease of native coronary artery without angina pectoris: Secondary | ICD-10-CM | POA: Diagnosis not present

## 2013-05-30 DIAGNOSIS — I5031 Acute diastolic (congestive) heart failure: Secondary | ICD-10-CM | POA: Diagnosis not present

## 2013-05-30 DIAGNOSIS — M353 Polymyalgia rheumatica: Secondary | ICD-10-CM | POA: Diagnosis not present

## 2013-05-31 ENCOUNTER — Ambulatory Visit (INDEPENDENT_AMBULATORY_CARE_PROVIDER_SITE_OTHER): Payer: Medicare Other | Admitting: Internal Medicine

## 2013-05-31 DIAGNOSIS — I4891 Unspecified atrial fibrillation: Secondary | ICD-10-CM

## 2013-05-31 DIAGNOSIS — M353 Polymyalgia rheumatica: Secondary | ICD-10-CM | POA: Diagnosis not present

## 2013-05-31 DIAGNOSIS — I2510 Atherosclerotic heart disease of native coronary artery without angina pectoris: Secondary | ICD-10-CM | POA: Diagnosis not present

## 2013-05-31 DIAGNOSIS — I509 Heart failure, unspecified: Secondary | ICD-10-CM | POA: Diagnosis not present

## 2013-05-31 DIAGNOSIS — Z48812 Encounter for surgical aftercare following surgery on the circulatory system: Secondary | ICD-10-CM | POA: Diagnosis not present

## 2013-05-31 DIAGNOSIS — I5031 Acute diastolic (congestive) heart failure: Secondary | ICD-10-CM | POA: Diagnosis not present

## 2013-05-31 LAB — POCT INR: INR: 2.2

## 2013-06-02 DIAGNOSIS — Z48812 Encounter for surgical aftercare following surgery on the circulatory system: Secondary | ICD-10-CM | POA: Diagnosis not present

## 2013-06-02 DIAGNOSIS — I4891 Unspecified atrial fibrillation: Secondary | ICD-10-CM | POA: Diagnosis not present

## 2013-06-02 DIAGNOSIS — M353 Polymyalgia rheumatica: Secondary | ICD-10-CM | POA: Diagnosis not present

## 2013-06-02 DIAGNOSIS — I509 Heart failure, unspecified: Secondary | ICD-10-CM | POA: Diagnosis not present

## 2013-06-02 DIAGNOSIS — I5031 Acute diastolic (congestive) heart failure: Secondary | ICD-10-CM | POA: Diagnosis not present

## 2013-06-02 DIAGNOSIS — I2510 Atherosclerotic heart disease of native coronary artery without angina pectoris: Secondary | ICD-10-CM | POA: Diagnosis not present

## 2013-06-03 ENCOUNTER — Telehealth: Payer: Self-pay | Admitting: *Deleted

## 2013-06-03 ENCOUNTER — Ambulatory Visit (INDEPENDENT_AMBULATORY_CARE_PROVIDER_SITE_OTHER): Payer: Medicare Other | Admitting: Physician Assistant

## 2013-06-03 ENCOUNTER — Encounter: Payer: Self-pay | Admitting: Physician Assistant

## 2013-06-03 ENCOUNTER — Ambulatory Visit (INDEPENDENT_AMBULATORY_CARE_PROVIDER_SITE_OTHER): Payer: Medicare Other | Admitting: *Deleted

## 2013-06-03 VITALS — BP 143/80 | HR 65 | Ht 69.0 in | Wt 175.8 lb

## 2013-06-03 DIAGNOSIS — I10 Essential (primary) hypertension: Secondary | ICD-10-CM

## 2013-06-03 DIAGNOSIS — I509 Heart failure, unspecified: Secondary | ICD-10-CM

## 2013-06-03 DIAGNOSIS — I5032 Chronic diastolic (congestive) heart failure: Secondary | ICD-10-CM

## 2013-06-03 DIAGNOSIS — R600 Localized edema: Secondary | ICD-10-CM

## 2013-06-03 DIAGNOSIS — Z954 Presence of other heart-valve replacement: Secondary | ICD-10-CM | POA: Diagnosis not present

## 2013-06-03 DIAGNOSIS — I4891 Unspecified atrial fibrillation: Secondary | ICD-10-CM

## 2013-06-03 DIAGNOSIS — E785 Hyperlipidemia, unspecified: Secondary | ICD-10-CM

## 2013-06-03 DIAGNOSIS — I359 Nonrheumatic aortic valve disorder, unspecified: Secondary | ICD-10-CM | POA: Diagnosis not present

## 2013-06-03 DIAGNOSIS — I2510 Atherosclerotic heart disease of native coronary artery without angina pectoris: Secondary | ICD-10-CM | POA: Diagnosis not present

## 2013-06-03 DIAGNOSIS — Z952 Presence of prosthetic heart valve: Secondary | ICD-10-CM

## 2013-06-03 DIAGNOSIS — R0602 Shortness of breath: Secondary | ICD-10-CM | POA: Diagnosis not present

## 2013-06-03 DIAGNOSIS — Z951 Presence of aortocoronary bypass graft: Secondary | ICD-10-CM | POA: Diagnosis not present

## 2013-06-03 DIAGNOSIS — R609 Edema, unspecified: Secondary | ICD-10-CM | POA: Diagnosis not present

## 2013-06-03 DIAGNOSIS — I4892 Unspecified atrial flutter: Secondary | ICD-10-CM

## 2013-06-03 LAB — BASIC METABOLIC PANEL
BUN: 12 mg/dL (ref 6–23)
CO2: 26 mEq/L (ref 19–32)
Calcium: 9.1 mg/dL (ref 8.4–10.5)
Chloride: 100 mEq/L (ref 96–112)
Creat: 0.81 mg/dL (ref 0.50–1.35)
Glucose, Bld: 102 mg/dL — ABNORMAL HIGH (ref 70–99)
Potassium: 4.4 mEq/L (ref 3.5–5.3)
Sodium: 139 mEq/L (ref 135–145)

## 2013-06-03 LAB — BRAIN NATRIURETIC PEPTIDE: Brain Natriuretic Peptide: 1781 pg/mL — ABNORMAL HIGH (ref 0.0–100.0)

## 2013-06-03 MED ORDER — POTASSIUM CHLORIDE CRYS ER 20 MEQ PO TBCR: EXTENDED_RELEASE_TABLET | ORAL | Status: DC

## 2013-06-03 MED ORDER — FUROSEMIDE 40 MG PO TABS: ORAL_TABLET | ORAL | Status: DC

## 2013-06-03 NOTE — Telephone Encounter (Signed)
Notes Recorded by Farrie Sann B Jairus Tonne on 06/03/2013 at 5:43 PM Patient seen by Scott PA today and was to decrease his K+ and Lasix to prn but with these results will continue both daily per Scott. Advised patient  

## 2013-06-03 NOTE — Progress Notes (Signed)
[facility], [place], [place]  [address] Phone: [phone] Fax:  [phone]  Date:  06/03/2013   ID:  Robert [name], DOB 08-May-1927, MRN [id number]  PCP:  Henrine Screws, MD  Cardiologist:  Dr. Casandra Doffing     History of Present Illness: Robert [name] is a 80 y.o. male with a hx of CAD with known occluded RCA and prior BMS to the CFX, parox AFib, mild AS, HTN, HL, remote GI bleed in 05/2009 (esophagitis/ulcer), prostate CA, PMR.  He was recently seen in the office and noted to be back in AFib and was placed on Eliquis for anticoagulation with plans for possible DCCV after 1 month.  Eliquis was changed to coumadin due to cost.  He had several medication adjustments as an OP for rate control but presented to the hospital 05/06/13 with acute CHF in the setting of poorly controlled VR from AFib.  He was found to have severe AS on echo (mean gradient 38 mmHg).  LHC demonstrated no significant CAD in LAD or CFX and known CTO of the RCA.  He was referred to [facility].  He underwent bioprosthetic AVR, left atrial MAZE procedure, left atrial closure using left atrial clip, CABG x1 (SVG-RV marginal).  He was started on amiodarone and digoxin postoperatively for atrial fibrillation.  Since discharge, the patient has been doing well. His chest is still somewhat sore. LE edema is improving. He denies significant dyspnea. He denies orthopnea, PND. He denies syncope.  Studies:  - LHC (05/09/13):  CTO of RCA with L-R collats  - Echo (05/07/13):  Mild LVH, EF 55-60%, severe aortic stenosis (mean gradient 38 mmHg), mild MR, mod LAE, mild RAE, PASP 53 mmHg  - Carotid US (05/11/13):  Bilateral ICA 1-39%   Recent Labs: 05/06/2013: Pro B Natriuretic peptide (BNP) 1466.0*  05/11/2013: TSH 1.463  05/15/2013: Hemoglobin 10.6*  05/17/2013: ALT 38; Creatinine 0.65; Potassium 3.8   Wt Readings from Last 3 Encounters:  06/03/13 175 lb 12.8 oz (79.742 kg)  05/18/13 186 lb 1.1 oz (84.4 kg)    05/18/13 186 lb 1.1 oz (84.4 kg)     Past Medical History  Diagnosis Date  . Hypertension   . Prostate cancer   . Hyperlipidemia   . Carpal tunnel syndrome     bilateral, carpal tunnel release x 2  . PAF (paroxysmal atrial fibrillation)   . Polymyalgia rheumatica   . GERD (gastroesophageal reflux disease)     EGD, Dr. Toney Rakes 2008  . Esophagitis     a. 05/2009 a/w GIB.  [name] Esophageal ulcer     a. 05/2009 with hemorrhage - injected with epinephrine-Dr. Herbie Baltimore Buccini.  . Vitamin D deficiency   . Blood loss anemia     a. 05/2009 a/w GIB.  [name] Hx of colonoscopy 2008    Dr. Toney Rakes, polyps- 5 yr f/u recommended  . Perforated ear drum     right ear drum, tube in left ear drum, Dr. Constance Holster  . GI bleed     a. 05/2009 as above: with associated anemia. Lower esophageal ulcer with extensive erosive esophagitis and large clot by EGD 05/2009.   [name] CAD (coronary artery disease)     a. Cath 04/2009: BMS to LCx 04/2009; CTO of RCA with L-R collaterals, 40% ostial diag.  . Aortic stenosis     a. Mild by cath 04/2009.    Current Outpatient Prescriptions  Medication Sig Dispense Refill  . amiodarone (PACERONE) 400 MG tablet Take 1  tablet (400 mg total) by mouth 2 (two) times daily. X 1 week, then 200 mg po bid  70 tablet  1  . aspirin EC 81 MG EC tablet Take 1 tablet (81 mg total) by mouth daily.      . cholecalciferol (VITAMIN D) 1000 UNITS tablet Take 1,000 Units by mouth daily.      . digoxin (LANOXIN) 0.25 MG tablet Take 1 tablet (0.25 mg total) by mouth daily.  30 tablet  1  . furosemide (LASIX) 40 MG tablet Take one tablet by mouth once daily  30 tablet  3  . metoprolol tartrate (LOPRESSOR) 25 MG tablet Take 1 tablet (25 mg total) by mouth 2 (two) times daily.  60 tablet  1  . Omega-3 Fatty Acids (FISH OIL) 1000 MG CAPS Take 2 capsules by mouth daily.      [name] omeprazole (PRILOSEC) 20 MG capsule Take 20 mg by mouth daily.      . potassium chloride SA (K-DUR,KLOR-CON) 20 MEQ tablet  Take one tablet by mouth daily  30 tablet  3  . simvastatin (ZOCOR) 40 MG tablet Take 40 mg by mouth daily.      . TOPROL XL 50 MG 24 hr tablet 2 (two) times daily.      . traMADol (ULTRAM) 50 MG tablet Take 1 tablet (50 mg total) by mouth every 6 (six) hours as needed for moderate pain.  30 tablet  0  . warfarin (COUMADIN) 2.5 MG tablet Take 1 tablet (2.5 mg total) by mouth daily. Or as directed by Coumadin clinic  60 tablet  1   No current facility-administered medications for this visit.    Allergies:   Lisinopril and Kenalog   Social History:  The patient  reports that he has never smoked. He does not have any smokeless tobacco history on file. He reports that he does not drink alcohol or use illicit drugs.   Family History:  The patient's family history includes Breast cancer in his daughter; CVA in his mother; Kidney disease in his father.   ROS:  Please see the history of present illness.      All other systems reviewed and negative.   PHYSICAL EXAM: VS:  BP 143/80  Pulse 65  Ht 5\' 9"  (1.753 m)  Wt 175 lb 12.8 oz (79.742 kg)  BMI 25.95 kg/m2 Well nourished, well developed, in no acute distress HEENT: normal Neck: no JVD Chest: Median sternotomy scar well-healed without erythema or discharge Cardiac:  normal S1, S2; RRR; no murmur Lungs:  clear to auscultation bilaterally, no wheezing, rhonchi or rales Abd: soft, nontender, no hepatomegaly Ext: trace-1+ bilateral LE edemaL>R Skin: warm and dry Neuro:  CNs 2-12 intact, no focal abnormalities noted  EKG:  Atrial flutter, HR 65, 4:1 block     ASSESSMENT AND PLAN:  1. Aortic Stenosis s/p AVR:  Patient is doing well after recent aortic valve replacement. He prefers to do cardiac rehabilitation at [facility]. He is not certain if this will work into his schedule at this time. He will call the hospital to arrange once he decides. He will follow up with Dr. Prescott Gum as scheduled. I will arrange follow up echocardiogram.  Continue SBE prophylaxis. 2. CAD s/p CABG:  As noted, he is doing well. Proceed with cardiac rehabilitation if he can. Continue aspirin, statin, beta blocker. 3. Atrial Fibrillation s/p Surgical MAZE:  He is in atrial flutter today. Rate is controlled. He is status post recent MAZE procedure and  he remains on amiodarone. I have suggested that he remain on 200 mg twice a day for now. He will likely revert to NSR on his own. If not, we can certainly consider cardioversion at some point in the next 4-6 weeks. Coumadin is being managed in our office through home health nursing. He denies any bleeding problems. 4. Chronic Diastolic CHF:  Volume overall stable. We discussed the possibility of changing his Lasix and potassium to as needed dosing. However, his labs did return after the patient left the office with an elevated BNP. Therefore, I have recommended that he remain on Lasix and potassium on a daily basis. 5. Hypertension:  Controlled. Continue current therapy. 6. Hyperlipidemia:  Continue statin. This is followed by primary care. 7. Disposition:  Follow up with Dr. Casandra Doffing in 4-6 weeks.  Signed, Chovanec Dopp, PA-C  06/03/2013 2:01 PM

## 2013-06-03 NOTE — Telephone Encounter (Signed)
Notes Recorded by Earvin Hansen on 06/03/2013 at 5:43 PM Patient seen by Nicki Reaper PA today and was to decrease his K+ and Lasix to prn but with these results will continue both daily per [facility]. Advised patient

## 2013-06-03 NOTE — Patient Instructions (Signed)
Your physician has recommended you make the following change in your medication:   1. TAKE LASIX AND POTASSIUM DAILY AS NEEDED  Your physician has requested that you have an echocardiogram. Echocardiography is a painless test that uses sound waves to create images of your heart. It provides your doctor with information about the size and shape of your heart and how well your heart's chambers and valves are working. This procedure takes approximately one hour. There are no restrictions for this procedure.  GET OTC COMPRESSION STOCKINGS TO HELP WITH SWELLING   Your physician recommends that you schedule a follow-up appointment in: 4 TO 6 WEEKS WITH DR. VARANASI

## 2013-06-06 ENCOUNTER — Other Ambulatory Visit: Payer: Self-pay | Admitting: *Deleted

## 2013-06-06 DIAGNOSIS — I509 Heart failure, unspecified: Secondary | ICD-10-CM | POA: Diagnosis not present

## 2013-06-06 DIAGNOSIS — I2510 Atherosclerotic heart disease of native coronary artery without angina pectoris: Secondary | ICD-10-CM | POA: Diagnosis not present

## 2013-06-06 DIAGNOSIS — I5031 Acute diastolic (congestive) heart failure: Secondary | ICD-10-CM | POA: Diagnosis not present

## 2013-06-06 DIAGNOSIS — M353 Polymyalgia rheumatica: Secondary | ICD-10-CM | POA: Diagnosis not present

## 2013-06-06 DIAGNOSIS — Z48812 Encounter for surgical aftercare following surgery on the circulatory system: Secondary | ICD-10-CM | POA: Diagnosis not present

## 2013-06-06 DIAGNOSIS — I4891 Unspecified atrial fibrillation: Secondary | ICD-10-CM | POA: Diagnosis not present

## 2013-06-07 ENCOUNTER — Ambulatory Visit (INDEPENDENT_AMBULATORY_CARE_PROVIDER_SITE_OTHER): Payer: Medicare Other | Admitting: Pharmacist Clinician (PhC)/ Clinical Pharmacy Specialist

## 2013-06-07 ENCOUNTER — Telehealth: Payer: Self-pay | Admitting: Interventional Cardiology

## 2013-06-07 DIAGNOSIS — I2510 Atherosclerotic heart disease of native coronary artery without angina pectoris: Secondary | ICD-10-CM | POA: Diagnosis not present

## 2013-06-07 DIAGNOSIS — I509 Heart failure, unspecified: Secondary | ICD-10-CM | POA: Diagnosis not present

## 2013-06-07 DIAGNOSIS — I4891 Unspecified atrial fibrillation: Secondary | ICD-10-CM | POA: Diagnosis not present

## 2013-06-07 DIAGNOSIS — M353 Polymyalgia rheumatica: Secondary | ICD-10-CM | POA: Diagnosis not present

## 2013-06-07 DIAGNOSIS — I5031 Acute diastolic (congestive) heart failure: Secondary | ICD-10-CM | POA: Diagnosis not present

## 2013-06-07 DIAGNOSIS — Z48812 Encounter for surgical aftercare following surgery on the circulatory system: Secondary | ICD-10-CM | POA: Diagnosis not present

## 2013-06-07 LAB — POCT INR: INR: 3.5

## 2013-06-07 NOTE — Telephone Encounter (Signed)
To CVRR 

## 2013-06-07 NOTE — Telephone Encounter (Signed)
[place] care nurse called to give  readings.  PT 41.9  INR 3.5 Request a call back to discuss dosages.. Please call

## 2013-06-07 NOTE — Telephone Encounter (Signed)
See anticoag note

## 2013-06-08 ENCOUNTER — Encounter: Payer: Self-pay | Admitting: Cardiothoracic Surgery

## 2013-06-08 ENCOUNTER — Ambulatory Visit (INDEPENDENT_AMBULATORY_CARE_PROVIDER_SITE_OTHER): Payer: Self-pay | Admitting: Cardiothoracic Surgery

## 2013-06-08 ENCOUNTER — Ambulatory Visit
Admission: RE | Admit: 2013-06-08 | Discharge: 2013-06-08 | Disposition: A | Discharge status: Home / Self Care | Payer: Medicare Other | Source: Ambulatory Visit | Attending: Cardiothoracic Surgery | Admitting: Cardiothoracic Surgery

## 2013-06-08 VITALS — BP 104/65 | HR 65 | Resp 20 | Ht 69.0 in | Wt 175.0 lb

## 2013-06-08 DIAGNOSIS — I2510 Atherosclerotic heart disease of native coronary artery without angina pectoris: Secondary | ICD-10-CM

## 2013-06-08 DIAGNOSIS — J90 Pleural effusion, not elsewhere classified: Secondary | ICD-10-CM | POA: Diagnosis not present

## 2013-06-08 DIAGNOSIS — Z9889 Other specified postprocedural states: Secondary | ICD-10-CM

## 2013-06-08 DIAGNOSIS — I4892 Unspecified atrial flutter: Secondary | ICD-10-CM

## 2013-06-08 DIAGNOSIS — I359 Nonrheumatic aortic valve disorder, unspecified: Secondary | ICD-10-CM

## 2013-06-08 DIAGNOSIS — Z8679 Personal history of other diseases of the circulatory system: Secondary | ICD-10-CM

## 2013-06-08 DIAGNOSIS — Z954 Presence of other heart-valve replacement: Secondary | ICD-10-CM

## 2013-06-08 DIAGNOSIS — Z951 Presence of aortocoronary bypass graft: Secondary | ICD-10-CM

## 2013-06-08 DIAGNOSIS — J9819 Other pulmonary collapse: Secondary | ICD-10-CM | POA: Diagnosis not present

## 2013-06-08 DIAGNOSIS — Z952 Presence of prosthetic heart valve: Secondary | ICD-10-CM

## 2013-06-08 NOTE — Progress Notes (Signed)
PCP is Henrine Screws, MD Referring Provider is Croitoru, Dani Gobble, MD  Chief Complaint  Patient presents with  . Routine Post Op    F/U from surgery with CXR, S/P AVR, CABG x 1 and MAZE on 05/12/13    HPI: The patient is a 80 year old gentleman who and her a recent combined CABG and aortic valve replacement as well as left atrial maze ablation when he presented and heart failure from severe AS and atrial fibrillation. He had a tissue-biologic valve placed. He did well after surgery. He was in sinus rhythm for the first few days but then developed atrial flutter which was rate controlled and he was placed on postop Coumadin. Since returning home he is decreased his strength and ambulation. The surgical incisions are healing well. He has no symptoms of angina or CHF. He is ready to start driving doing some yard work. He'll be seen by his cardiologist in the next 3 weeks to discuss possible cardioversion out of atrial flutter. He has had no bleeding complications from the Coumadin.   Past Medical History  Diagnosis Date  . Hypertension   . Prostate cancer   . Hyperlipidemia   . Carpal tunnel syndrome     bilateral, carpal tunnel release x 2  . PAF (paroxysmal atrial fibrillation)   . Polymyalgia rheumatica   . GERD (gastroesophageal reflux disease)     EGD, Dr. Toney Rakes 2008  . Esophagitis     a. 05/2009 a/w GIB.  [name] Esophageal ulcer     a. 05/2009 with hemorrhage - injected with epinephrine-Dr. Herbie Baltimore Buccini.  . Vitamin D deficiency   . Blood loss anemia     a. 05/2009 a/w GIB.  [name] Hx of colonoscopy 2008    Dr. Toney Rakes, polyps- 5 yr f/u recommended  . Perforated ear drum     right ear drum, tube in left ear drum, Dr. Constance Holster  . GI bleed     a. 05/2009 as above: with associated anemia. Lower esophageal ulcer with extensive erosive esophagitis and large clot by EGD 05/2009.   [name] CAD (coronary artery disease)     a. Cath 04/2009: BMS to LCx 04/2009; CTO of RCA with L-R  collaterals, 40% ostial diag.  . Aortic stenosis     a. Mild by cath 04/2009.    Past Surgical History  Procedure Laterality Date  . Cardiac catheterization  2011  . Prostatectomy    . Coronary artery bypass graft N/A 05/12/2013    Procedure: CORONARY ARTERY BYPASS GRAFTING (CABG);  Surgeon: Ivin Poot, MD;  Location: [facility];  Service: Open Heart Surgery;  Laterality: N/A;  CABG x 1 using left leg greater saphenous vein harvested endoscopically  . Aortic valve replacement N/A 05/12/2013    Procedure: AORTIC VALVE REPLACEMENT (AVR);  Surgeon: Ivin Poot, MD;  Location: [place];  Service: Open Heart Surgery;  Laterality: N/A;  . Intraoperative transesophageal echocardiogram N/A 05/12/2013    Procedure: INTRAOPERATIVE TRANSESOPHAGEAL ECHOCARDIOGRAM;  Surgeon: Ivin Poot, MD;  Location: [place];  Service: Open Heart Surgery;  Laterality: N/A;  . Maze N/A 05/12/2013    Procedure: MAZE;  Surgeon: Ivin Poot, MD;  Location: [facility];  Service: Open Heart Surgery;  Laterality: N/A;    Family History  Problem Relation Age of Onset  . CVA Mother   . Kidney disease Father   . Breast cancer Daughter     Social History History  Substance Use Topics  . Smoking status: Never Smoker   .  Smokeless tobacco: Not on file  . Alcohol Use: No    Current Outpatient Prescriptions  Medication Sig Dispense Refill  . amiodarone (PACERONE) 400 MG tablet Take 1 tablet (400 mg total) by mouth 2 (two) times daily. X 1 week, then 200 mg po bid  70 tablet  1  . aspirin EC 81 MG EC tablet Take 1 tablet (81 mg total) by mouth daily.      . cholecalciferol (VITAMIN D) 1000 UNITS tablet Take 1,000 Units by mouth daily.      . digoxin (LANOXIN) 0.25 MG tablet Take 1 tablet (0.25 mg total) by mouth daily.  30 tablet  1  . furosemide (LASIX) 40 MG tablet Take 40 mg by mouth daily.       . metoprolol tartrate (LOPRESSOR) 25 MG tablet Take 1 tablet (25 mg total) by mouth 2 (two) times daily.  60 tablet  1  .  Omega-3 Fatty Acids (FISH OIL) 1000 MG CAPS Take 2 capsules by mouth daily.      [name] omeprazole (PRILOSEC) 20 MG capsule Take 20 mg by mouth daily.      . potassium chloride SA (K-DUR,KLOR-CON) 20 MEQ tablet Take 20 mEq by mouth daily.       . simvastatin (ZOCOR) 40 MG tablet Take 40 mg by mouth daily.      . traMADol (ULTRAM) 50 MG tablet Take 1 tablet (50 mg total) by mouth every 6 (six) hours as needed for moderate pain.  30 tablet  0  . warfarin (COUMADIN) 2.5 MG tablet Take 1 tablet (2.5 mg total) by mouth daily. Or as directed by Coumadin clinic  60 tablet  1   No current facility-administered medications for this visit.    Allergies  Allergen Reactions  . Lisinopril Cough  . Kenalog [Triamcinolone Acetonide] Rash    Review of Systems no fever, no drainage from his incisions  BP 104/65  Pulse 65  Resp 20  Ht 5\' 9"  (1.753 m)  Wt 175 lb (79.379 kg)  BMI 25.83 kg/m2  SpO2 95% Physical Exam Alert and comfortable appears to be doing well Lungs clear sternum well-healed Heart rhythm regular soft flow murmur through aortic valve prosthesis Abdomen soft Extremities without edema Neuro intact  Diagnostic Tests:  Chest x-ray clear Impression:  Doing well one month after aVR CABG with maze for rapid atrial fibrillation. He is now in a 4-1 flutter on Coumadin. Plan: The patient will start driving and he will be able to increase his weight limit to 20 pounds.  Return in 6 weeks for review of progress.

## 2013-06-10 DIAGNOSIS — I959 Hypotension, unspecified: Secondary | ICD-10-CM | POA: Diagnosis not present

## 2013-06-10 DIAGNOSIS — Z954 Presence of other heart-valve replacement: Secondary | ICD-10-CM | POA: Diagnosis not present

## 2013-06-10 DIAGNOSIS — Z5181 Encounter for therapeutic drug level monitoring: Secondary | ICD-10-CM | POA: Diagnosis not present

## 2013-06-10 DIAGNOSIS — R609 Edema, unspecified: Secondary | ICD-10-CM | POA: Diagnosis not present

## 2013-06-10 DIAGNOSIS — I2510 Atherosclerotic heart disease of native coronary artery without angina pectoris: Secondary | ICD-10-CM | POA: Diagnosis not present

## 2013-06-14 ENCOUNTER — Ambulatory Visit (INDEPENDENT_AMBULATORY_CARE_PROVIDER_SITE_OTHER): Payer: Medicare Other | Admitting: Cardiovascular Disease

## 2013-06-14 DIAGNOSIS — I5031 Acute diastolic (congestive) heart failure: Secondary | ICD-10-CM | POA: Diagnosis not present

## 2013-06-14 DIAGNOSIS — I2510 Atherosclerotic heart disease of native coronary artery without angina pectoris: Secondary | ICD-10-CM | POA: Diagnosis not present

## 2013-06-14 DIAGNOSIS — I4891 Unspecified atrial fibrillation: Secondary | ICD-10-CM

## 2013-06-14 DIAGNOSIS — Z48812 Encounter for surgical aftercare following surgery on the circulatory system: Secondary | ICD-10-CM | POA: Diagnosis not present

## 2013-06-14 DIAGNOSIS — I509 Heart failure, unspecified: Secondary | ICD-10-CM | POA: Diagnosis not present

## 2013-06-14 DIAGNOSIS — M353 Polymyalgia rheumatica: Secondary | ICD-10-CM | POA: Diagnosis not present

## 2013-06-14 LAB — POCT INR: INR: 2.6

## 2013-06-15 ENCOUNTER — Telehealth: Payer: Self-pay

## 2013-06-15 NOTE — Telephone Encounter (Signed)
[facility] called, about 3 RX's, amiodarone 200 mg, Zocor 40 mg, and metoprolol 25 mg, the SIG and amounts given. Instructed them to d/c the Zocor and pt can f/u with Cardiologist about medication and mg. Emailed [employer] NP-cardiologist about sig on the Amiodarone and he said the pt should be taking 200 mg BID. The metoprolol is 25 mg bid. I will call the pt to confirm doses and instructions.

## 2013-06-21 ENCOUNTER — Ambulatory Visit (INDEPENDENT_AMBULATORY_CARE_PROVIDER_SITE_OTHER): Payer: Medicare Other | Admitting: Cardiology

## 2013-06-21 ENCOUNTER — Telehealth: Payer: Self-pay | Admitting: *Deleted

## 2013-06-21 ENCOUNTER — Encounter: Payer: Self-pay | Admitting: Physician Assistant

## 2013-06-21 ENCOUNTER — Ambulatory Visit (HOSPITAL_COMMUNITY): Payer: Medicare Other | Attending: Physician Assistant | Admitting: Radiology

## 2013-06-21 DIAGNOSIS — I4891 Unspecified atrial fibrillation: Secondary | ICD-10-CM | POA: Diagnosis not present

## 2013-06-21 DIAGNOSIS — I2510 Atherosclerotic heart disease of native coronary artery without angina pectoris: Secondary | ICD-10-CM | POA: Diagnosis not present

## 2013-06-21 DIAGNOSIS — I359 Nonrheumatic aortic valve disorder, unspecified: Secondary | ICD-10-CM | POA: Diagnosis not present

## 2013-06-21 DIAGNOSIS — Z954 Presence of other heart-valve replacement: Secondary | ICD-10-CM | POA: Diagnosis not present

## 2013-06-21 DIAGNOSIS — Z48812 Encounter for surgical aftercare following surgery on the circulatory system: Secondary | ICD-10-CM | POA: Diagnosis not present

## 2013-06-21 DIAGNOSIS — M353 Polymyalgia rheumatica: Secondary | ICD-10-CM | POA: Diagnosis not present

## 2013-06-21 DIAGNOSIS — I5031 Acute diastolic (congestive) heart failure: Secondary | ICD-10-CM | POA: Diagnosis not present

## 2013-06-21 DIAGNOSIS — Z952 Presence of prosthetic heart valve: Secondary | ICD-10-CM

## 2013-06-21 DIAGNOSIS — I509 Heart failure, unspecified: Secondary | ICD-10-CM | POA: Diagnosis not present

## 2013-06-21 LAB — POCT INR: INR: 2.8

## 2013-06-21 NOTE — Progress Notes (Signed)
Echocardiogram Performed. 

## 2013-06-21 NOTE — Telephone Encounter (Signed)
s/w both pt and his wife about echo results with verbal understanding to results.

## 2013-06-23 ENCOUNTER — Telehealth: Payer: Self-pay | Admitting: Interventional Cardiology

## 2013-06-23 DIAGNOSIS — R609 Edema, unspecified: Secondary | ICD-10-CM | POA: Diagnosis not present

## 2013-06-23 DIAGNOSIS — R21 Rash and other nonspecific skin eruption: Secondary | ICD-10-CM | POA: Diagnosis not present

## 2013-06-23 NOTE — Telephone Encounter (Signed)
New message          Pt has a rash and does not know if it is because of medication. Please call pt back.

## 2013-06-23 NOTE — Telephone Encounter (Signed)
Follow up     Pt thinks he was cut off during transfer to nurse.  However, I told him we are waiting to get a response from the pharmacist and we would call him back

## 2013-06-23 NOTE — Telephone Encounter (Signed)
Spoke with pt and he has been on all of the same medications since bypass surgery. Pt spoke with Dr. Nils Pyle and his office told pt to see PCP. Pt will be seeing Dr. Amedeo Kinsman today at 4:15 pm for the rash. Pt still wants to know what we think would have caused the rash. He states it is all over his body and it started after he took a bath today.

## 2013-06-23 NOTE — Telephone Encounter (Signed)
Pt notified. Pt will let Dr. Amedeo Kinsman know when he sees her. He will call back if he has questions or he will have Dr. Amedeo Kinsman call us.

## 2013-06-23 NOTE — Telephone Encounter (Signed)
It appears furosemide was a new medication.  This could possibly be the culprit, especially if he has a allergy to sulfur.

## 2013-06-23 NOTE — Telephone Encounter (Signed)
Robert [name], would any of pts medication cause a rash?

## 2013-06-27 DIAGNOSIS — Z954 Presence of other heart-valve replacement: Secondary | ICD-10-CM | POA: Diagnosis not present

## 2013-06-27 DIAGNOSIS — R21 Rash and other nonspecific skin eruption: Secondary | ICD-10-CM | POA: Diagnosis not present

## 2013-06-27 DIAGNOSIS — R609 Edema, unspecified: Secondary | ICD-10-CM | POA: Diagnosis not present

## 2013-06-27 DIAGNOSIS — M79609 Pain in unspecified limb: Secondary | ICD-10-CM | POA: Diagnosis not present

## 2013-07-01 ENCOUNTER — Ambulatory Visit (INDEPENDENT_AMBULATORY_CARE_PROVIDER_SITE_OTHER): Payer: Medicare Other | Admitting: Cardiology

## 2013-07-01 DIAGNOSIS — M353 Polymyalgia rheumatica: Secondary | ICD-10-CM | POA: Diagnosis not present

## 2013-07-01 DIAGNOSIS — I2510 Atherosclerotic heart disease of native coronary artery without angina pectoris: Secondary | ICD-10-CM | POA: Diagnosis not present

## 2013-07-01 DIAGNOSIS — I5031 Acute diastolic (congestive) heart failure: Secondary | ICD-10-CM | POA: Diagnosis not present

## 2013-07-01 DIAGNOSIS — I4891 Unspecified atrial fibrillation: Secondary | ICD-10-CM

## 2013-07-01 DIAGNOSIS — Z48812 Encounter for surgical aftercare following surgery on the circulatory system: Secondary | ICD-10-CM | POA: Diagnosis not present

## 2013-07-01 DIAGNOSIS — I509 Heart failure, unspecified: Secondary | ICD-10-CM | POA: Diagnosis not present

## 2013-07-01 LAB — POCT INR: INR: 2.2

## 2013-07-11 ENCOUNTER — Telehealth: Payer: Self-pay | Admitting: Interventional Cardiology

## 2013-07-11 DIAGNOSIS — R42 Dizziness and giddiness: Secondary | ICD-10-CM

## 2013-07-11 DIAGNOSIS — I4891 Unspecified atrial fibrillation: Secondary | ICD-10-CM

## 2013-07-11 DIAGNOSIS — R55 Syncope and collapse: Secondary | ICD-10-CM

## 2013-07-11 NOTE — Telephone Encounter (Signed)
FYI to Dr. Varanasi.  

## 2013-07-11 NOTE — Telephone Encounter (Signed)
LMTCB

## 2013-07-11 NOTE — Telephone Encounter (Signed)
New message   Wife calling think his medication need to be change.   Patient call the surgeon today -advise to call Dr. Ed Blalock.  Patient  Has appt 5/19.   Wife stated appt with Dr. Irish Lack is too far off . Can he be work in today.

## 2013-07-11 NOTE — Telephone Encounter (Signed)
New Message  pt wife called states that the pt gets dizzy when he stands up and his heart rate falls to the low 40's. She is afraid that he will fall out if he pushes himself to walk. Pt is requesting a same day appt. Please call

## 2013-07-11 NOTE — Telephone Encounter (Addendum)
Patient called back. He has complaints of dizzy spells with change of position times 3 weeks. States BP is 126/66 and heart rate 40's and 50's. Not sure if this heart rate is accurate because of history of atrial fib. Has not seen Dr.Varanasi since his aortic valve surgery. Advised to change positions from lying to sitting to standing slowly to hopefully decrease symptoms. Worked into Dr.V schedule for 5/19 at 245 pm. Patient verbalized understanding of above.

## 2013-07-11 NOTE — Telephone Encounter (Signed)
To triage

## 2013-07-11 NOTE — Telephone Encounter (Signed)
Pts wife notified. Pt is aware that if he passes out between now and tomorrow he should call EMS. Pt advised per Dr. Radford Pax to not drive until after nurse visit tomorrow.

## 2013-07-11 NOTE — Telephone Encounter (Signed)
Per Dr. Radford Pax pt needs to come if for orthostatic's, ekg and 24 hr holter. Appt made for pt to have nurse visits at 9:15 and for holter to placed at 11am.

## 2013-07-12 ENCOUNTER — Encounter: Payer: Self-pay | Admitting: Cardiology

## 2013-07-12 ENCOUNTER — Ambulatory Visit (INDEPENDENT_AMBULATORY_CARE_PROVIDER_SITE_OTHER): Payer: Medicare Other

## 2013-07-12 ENCOUNTER — Encounter (INDEPENDENT_AMBULATORY_CARE_PROVIDER_SITE_OTHER): Payer: Medicare Other

## 2013-07-12 ENCOUNTER — Encounter: Payer: Self-pay | Admitting: *Deleted

## 2013-07-12 VITALS — BP 122/62 | HR 44 | Ht 69.0 in | Wt 173.2 lb

## 2013-07-12 DIAGNOSIS — R42 Dizziness and giddiness: Secondary | ICD-10-CM

## 2013-07-12 DIAGNOSIS — R55 Syncope and collapse: Secondary | ICD-10-CM

## 2013-07-12 DIAGNOSIS — I4891 Unspecified atrial fibrillation: Secondary | ICD-10-CM

## 2013-07-12 NOTE — Progress Notes (Signed)
Did he have ECG today? Concerned that he is having symptomatic bradycardia causing syncope.  He could need a pacer.

## 2013-07-12 NOTE — Progress Notes (Signed)
Yes, he had EKG with junctional rhythm and HR of 45 bpm. Pt has heart monitor placed today and he will be seeing you on Friday.

## 2013-07-12 NOTE — Progress Notes (Signed)
Patient ID: Robert [name], male   DOB: November 11, 1927, 80 y.o.   MRN: [id number] [facility] 24 hour holter monitor applied to patient.

## 2013-07-12 NOTE — Progress Notes (Signed)
1.) Reason for visit:EKG and Orthostatic B/P  2.) Name of MD requesting visit: Dr.Varanasi  3.) H&P: Pre Syncope,Dizziness,Atrial Fib  4.) ROS related to problem: Patient has been dizzy,weak feels faint for the past 2 weeks  5.) Assessment and plan per MD:

## 2013-07-12 NOTE — Patient Instructions (Signed)
Stop Digoxin ( Lanoxin )  Stop Metoprolol  24 hour Monitor today at 11:00am   Appointment with Dr.Varanasi Friday 07/15/13 10 :15 am

## 2013-07-15 ENCOUNTER — Ambulatory Visit (INDEPENDENT_AMBULATORY_CARE_PROVIDER_SITE_OTHER): Payer: Medicare Other | Admitting: Interventional Cardiology

## 2013-07-15 ENCOUNTER — Encounter: Payer: Self-pay | Admitting: Interventional Cardiology

## 2013-07-15 ENCOUNTER — Ambulatory Visit (INDEPENDENT_AMBULATORY_CARE_PROVIDER_SITE_OTHER): Payer: Medicare Other | Admitting: Cardiology

## 2013-07-15 VITALS — BP 175/90 | HR 76 | Ht 69.0 in | Wt 172.1 lb

## 2013-07-15 DIAGNOSIS — I359 Nonrheumatic aortic valve disorder, unspecified: Secondary | ICD-10-CM

## 2013-07-15 DIAGNOSIS — I10 Essential (primary) hypertension: Secondary | ICD-10-CM

## 2013-07-15 DIAGNOSIS — I2510 Atherosclerotic heart disease of native coronary artery without angina pectoris: Secondary | ICD-10-CM

## 2013-07-15 DIAGNOSIS — I350 Nonrheumatic aortic (valve) stenosis: Secondary | ICD-10-CM

## 2013-07-15 DIAGNOSIS — I4891 Unspecified atrial fibrillation: Secondary | ICD-10-CM

## 2013-07-15 DIAGNOSIS — I5031 Acute diastolic (congestive) heart failure: Secondary | ICD-10-CM | POA: Diagnosis not present

## 2013-07-15 DIAGNOSIS — M353 Polymyalgia rheumatica: Secondary | ICD-10-CM | POA: Diagnosis not present

## 2013-07-15 DIAGNOSIS — Z48812 Encounter for surgical aftercare following surgery on the circulatory system: Secondary | ICD-10-CM | POA: Diagnosis not present

## 2013-07-15 DIAGNOSIS — I509 Heart failure, unspecified: Secondary | ICD-10-CM | POA: Diagnosis not present

## 2013-07-15 LAB — POCT INR: INR: 2

## 2013-07-15 MED ORDER — AMIODARONE HCL 200 MG PO TABS: ORAL_TABLET | ORAL | Status: DC

## 2013-07-15 MED ORDER — SPIRONOLACTONE 25 MG PO TABS: 25.0000 mg | ORAL_TABLET | Freq: Every day | ORAL | Status: DC

## 2013-07-15 MED ORDER — AMIODARONE HCL 400 MG PO TABS: ORAL_TABLET | ORAL | Status: DC

## 2013-07-15 MED ORDER — AMLODIPINE BESYLATE 5 MG PO TABS: 5.0000 mg | ORAL_TABLET | Freq: Every day | ORAL | Status: DC

## 2013-07-15 NOTE — Progress Notes (Signed)
Patient ID: Robert [name], male   DOB: 09/25/1927, 80 y.o.   MRN: [id number]    Robert [name], [name], [place]  [id number] Phone: [phone] Fax:  [phone]  Date:  07/15/2013   ID:  Robert [name], DOB 09/29/1927, MRN [id number]  PCP:  Henrine Screws, MD      History of Present Illness: Robert [name] is a 80 y.o. male who had a BS to the circumflex a few years ago.  He had AFib in 2/15.  Workup revealed severe AS.  He had AVR with single vessel CABG, Maze procedure and left atrial clip.  He had presyncope and his HR was in the 40s.  Exercise tolerance was decreasing signficantly as well. His beta blocker and digoxin were stopped and he has noted an improvement.  He walked a mile yesterday, for the first time yesterday.  No CP, SHOB, swelling.  No trouble with coumadin.  He would like to come off of coumadin.    BP has increased since stopping the metoprolol.  He has taken Benicar in the past.  He had a rash, presumed to be from Lasix.  He was switched to Spironolactone.     Wt Readings from Last 3 Encounters:  07/15/13 172 lb 1.9 oz (78.073 kg)  07/12/13 173 lb 3 oz (78.557 kg)  06/08/13 175 lb (79.379 kg)     Past Medical History  Diagnosis Date  . Hypertension   . Prostate cancer   . Hyperlipidemia   . Carpal tunnel syndrome     bilateral, carpal tunnel release x 2  . PAF (paroxysmal atrial fibrillation)   . Polymyalgia rheumatica   . GERD (gastroesophageal reflux disease)     EGD, Dr. Toney Rakes 2008  . Esophagitis     a. 05/2009 a/w GIB.  Robert [name] Esophageal ulcer     a. 05/2009 with hemorrhage - injected with epinephrine-Dr. Herbie Baltimore Buccini.  . Vitamin D deficiency   . Blood loss anemia     a. 05/2009 a/w GIB.  Robert [name] Hx of colonoscopy 2008    Dr. Toney Rakes, polyps- 5 yr f/u recommended  . Perforated ear drum     right ear drum, tube in left ear drum, Dr. Constance Holster  . GI bleed     a. 05/2009 as above: with associated anemia. Lower  esophageal ulcer with extensive erosive esophagitis and large clot by EGD 05/2009.   Robert [name] CAD (coronary artery disease)     a. Cath 04/2009: BMS to LCx 04/2009; CTO of RCA with L-R collaterals, 40% ostial diag.  . Aortic stenosis     a. s/p tissue AVR 05/2013;  b. Echo (06/2013):  Mod LVH, EF 60-65%, no RWMA, Gr 2 DD, AVR ok (mean 13 mmHg), MAC, mild MR, mod LAE, mild RAE, PASP 46 mmHg (mild pulmo HTN)    Current Outpatient Prescriptions  Medication Sig Dispense Refill  . amiodarone (PACERONE) 400 MG tablet Take 1 tablet (400 mg total) by mouth 2 (two) times daily. X 1 week, then 200 mg po bid  70 tablet  1  . aspirin EC 81 MG EC tablet Take 1 tablet (81 mg total) by mouth daily.      . cholecalciferol (VITAMIN D) 1000 UNITS tablet Take 1,000 Units by mouth daily.      . Omega-3 Fatty Acids (FISH OIL) 1000 MG CAPS Take 2 capsules by mouth daily.      Robert [name] omeprazole (PRILOSEC) 20 MG capsule Take 20 mg by mouth  daily.      . simvastatin (ZOCOR) 40 MG tablet Take 40 mg by mouth daily.      Robert [name] spironolactone (ALDACTONE) 25 MG tablet Take 25 mg by mouth 2 (two) times daily.      . traMADol (ULTRAM) 50 MG tablet Take 1 tablet (50 mg total) by mouth every 6 (six) hours as needed for moderate pain.  30 tablet  0  . warfarin (COUMADIN) 2.5 MG tablet Take 1 tablet (2.5 mg total) by mouth daily. Or as directed by Coumadin clinic  60 tablet  1   No current facility-administered medications for this visit.    Allergies:    Allergies  Allergen Reactions  . Lasix [Furosemide]   . Lisinopril Cough  . Kenalog [Triamcinolone Acetonide] Rash    Social History:  The patient  reports that he has never smoked. He does not have any smokeless tobacco history on file. He reports that he does not drink alcohol or use illicit drugs.   Family History:  The patient's family history includes Breast cancer in his daughter; CVA in his mother; Kidney disease in his father.   ROS:  Please see the history of present illness.   No nausea, vomiting.  No fevers, chills.  No focal weakness.  No dysuria.    All other systems reviewed and negative.   PHYSICAL EXAM: VS:  BP 175/90  Pulse 76  Ht 5\' 9"  (1.753 m)  Wt 172 lb 1.9 oz (78.073 kg)  BMI 25.41 kg/m2 Well nourished, well developed, in no acute distress HEENT: normal Neck: no JVD, no carotid bruits Cardiac:  normal S1, S2; RRR;  Lungs:  clear to auscultation bilaterally, no wheezing, rhonchi or rales Abd: soft, nontender, no hepatomegaly Ext: no edema Skin: warm and dry Neuro:   no focal abnormalities noted  EKG:  NSR, NSST     ASSESSMENT AND PLAN:  1. AVR: We'll plan for Coumadin for 3 months post valve replacement. 2. AFib: Decrease amiodarone to 200 mg daily. Now in normal sinus rhythm. Left atrial appendage was clipped. He may not require long-term Coumadin. 3. CAD: Status post SVG RCA bypass. Patent circumflex stent. No angina. 4. Presyncope: This has resolved since rate slowing drugs have been decreased. 5. HTN: Elevated. Will start amlodipine 5 mg daily. Decreasing spironolactone to 25 mg daily. Hesitant to start ARB due to the fact that he is already on Benicar.   Signed, Mina Marble, MD, [facility] 07/15/2013 10:52 AM

## 2013-07-15 NOTE — Patient Instructions (Addendum)
Your physician has recommended you make the following change in your medication:   1. Start Amlodipine 5 mg daily.   2. Decrease Spironolactone to 25 mg 1 tablet daily.   3. Decrease Amiodarone to 200 mg daily.   NEXT APPT July 13 AT 8:15 AM.

## 2013-07-18 ENCOUNTER — Ambulatory Visit (INDEPENDENT_AMBULATORY_CARE_PROVIDER_SITE_OTHER): Payer: Self-pay | Admitting: Cardiothoracic Surgery

## 2013-07-18 ENCOUNTER — Encounter: Payer: Self-pay | Admitting: Cardiothoracic Surgery

## 2013-07-18 VITALS — BP 168/92 | HR 74 | Resp 20 | Ht 69.0 in | Wt 172.0 lb

## 2013-07-18 DIAGNOSIS — Z951 Presence of aortocoronary bypass graft: Secondary | ICD-10-CM

## 2013-07-18 DIAGNOSIS — Z8679 Personal history of other diseases of the circulatory system: Secondary | ICD-10-CM

## 2013-07-18 DIAGNOSIS — Z954 Presence of other heart-valve replacement: Secondary | ICD-10-CM

## 2013-07-18 DIAGNOSIS — Z9889 Other specified postprocedural states: Secondary | ICD-10-CM

## 2013-07-18 DIAGNOSIS — Z952 Presence of prosthetic heart valve: Secondary | ICD-10-CM

## 2013-07-18 NOTE — Progress Notes (Signed)
PCP is Henrine Screws, MD Referring Provider is Jettie Booze, MD  Chief Complaint  Patient presents with  . Routine Post Op    6 week f/u    HPI: Patient presents for 10 week followup after aVR-CABG, maze procedure. He is doing well walking up to a mile a day. After recent chest that of his heart medications by his cardiologist his heart rate is now sinus rhythm 70-80 per minute period he denies any symptoms of angina or CHF. The surgical incisions are healing well. The patient still taking Coumadin for his maze procedure I feel he could stopped at at 3 months postop since he has maintained sinus rhythm and the left atrial appendage was ligated at the time of surgery.  Past Medical History  Diagnosis Date  . Hypertension   . Prostate cancer   . Hyperlipidemia   . Carpal tunnel syndrome     bilateral, carpal tunnel release x 2  . PAF (paroxysmal atrial fibrillation)   . Polymyalgia rheumatica   . GERD (gastroesophageal reflux disease)     EGD, Dr. Toney Rakes 2008  . Esophagitis     a. 05/2009 a/w GIB.  [name] Esophageal ulcer     a. 05/2009 with hemorrhage - injected with epinephrine-Dr. Herbie Baltimore Buccini.  . Vitamin D deficiency   . Blood loss anemia     a. 05/2009 a/w GIB.  [name] Hx of colonoscopy 2008    Dr. Toney Rakes, polyps- 5 yr f/u recommended  . Perforated ear drum     right ear drum, tube in left ear drum, Dr. Constance Holster  . GI bleed     a. 05/2009 as above: with associated anemia. Lower esophageal ulcer with extensive erosive esophagitis and large clot by EGD 05/2009.   [name] CAD (coronary artery disease)     a. Cath 04/2009: BMS to LCx 04/2009; CTO of RCA with L-R collaterals, 40% ostial diag.  . Aortic stenosis     a. s/p tissue AVR 05/2013;  b. Echo (06/2013):  Mod LVH, EF 60-65%, no RWMA, Gr 2 DD, AVR ok (mean 13 mmHg), MAC, mild MR, mod LAE, mild RAE, PASP 46 mmHg (mild pulmo HTN)    Past Surgical History  Procedure Laterality Date  . Cardiac catheterization  2011   . Prostatectomy    . Coronary artery bypass graft N/A 05/12/2013    Procedure: CORONARY ARTERY BYPASS GRAFTING (CABG);  Surgeon: Ivin Poot, MD;  Location: [facility];  Service: Open Heart Surgery;  Laterality: N/A;  CABG x 1 using left leg greater saphenous vein harvested endoscopically  . Aortic valve replacement N/A 05/12/2013    Procedure: AORTIC VALVE REPLACEMENT (AVR);  Surgeon: Ivin Poot, MD;  Location: [place];  Service: Open Heart Surgery;  Laterality: N/A;  . Intraoperative transesophageal echocardiogram N/A 05/12/2013    Procedure: INTRAOPERATIVE TRANSESOPHAGEAL ECHOCARDIOGRAM;  Surgeon: Ivin Poot, MD;  Location: [place];  Service: Open Heart Surgery;  Laterality: N/A;  . Maze N/A 05/12/2013    Procedure: MAZE;  Surgeon: Ivin Poot, MD;  Location: [facility];  Service: Open Heart Surgery;  Laterality: N/A;    Family History  Problem Relation Age of Onset  . CVA Mother   . Kidney disease Father   . Breast cancer Daughter     Social History History  Substance Use Topics  . Smoking status: Never Smoker   . Smokeless tobacco: Not on file  . Alcohol Use: No    Current Outpatient Prescriptions  Medication Sig  Dispense Refill  . amiodarone (PACERONE) 200 MG tablet 200 mg daily  70 tablet  1  . amLODipine (NORVASC) 5 MG tablet Take 1 tablet (5 mg total) by mouth daily.  30 tablet  3  . aspirin EC 81 MG EC tablet Take 1 tablet (81 mg total) by mouth daily.      . cholecalciferol (VITAMIN D) 1000 UNITS tablet Take 1,000 Units by mouth daily.      . Omega-3 Fatty Acids (FISH OIL) 1000 MG CAPS Take 2 capsules by mouth daily.      [name] omeprazole (PRILOSEC) 20 MG capsule Take 20 mg by mouth daily.      . simvastatin (ZOCOR) 40 MG tablet Take 40 mg by mouth daily.      [name] spironolactone (ALDACTONE) 25 MG tablet Take 1 tablet (25 mg total) by mouth daily.      . traMADol (ULTRAM) 50 MG tablet Take 1 tablet (50 mg total) by mouth every 6 (six) hours as needed for moderate pain.  30  tablet  0  . warfarin (COUMADIN) 2.5 MG tablet Take 1 tablet (2.5 mg total) by mouth daily. Or as directed by Coumadin clinic  60 tablet  1   No current facility-administered medications for this visit.    Allergies  Allergen Reactions  . Lasix [Furosemide]   . Lisinopril Cough  . Kenalog [Triamcinolone Acetonide] Rash    Review of Systems improved energy, improved sleeping habits, improved appetite and overall strength  BP 168/92  Pulse 74  Resp 20  Ht 5\' 9"  (1.753 m)  Wt 172 lb (78.019 kg)  BMI 25.39 kg/m2  SpO2 96% Physical Exam Alert and comfortable Lungs clear Cardiac rhythm regular soft flow murmur no diastolic murmur of AI Sinus rhythm\  Diagnostic Tests: Last chest x-ray reviewed showing clear lung fields no pleural effusion  Impression: Excellent course almost 3 months postop. He will see his cardiologist Dr. Irish Lack next month. I feel would be safe to stop his Coumadin at 3 months postop since he has maintained sinus rhythm and is 80 years old. Plan: Return as needed. Antibiotic prophylaxis discussed with patient but he has full dentures.

## 2013-07-19 ENCOUNTER — Ambulatory Visit: Payer: Medicare Other | Admitting: Interventional Cardiology

## 2013-07-20 ENCOUNTER — Ambulatory Visit: Payer: Medicare Other | Admitting: Cardiothoracic Surgery

## 2013-07-27 ENCOUNTER — Telehealth: Payer: Self-pay | Admitting: Cardiology

## 2013-07-27 NOTE — Telephone Encounter (Signed)
Pts wife notified as she takes pts calls.

## 2013-07-27 NOTE — Telephone Encounter (Addendum)
Dr. Hassell Done Interpretation of 24 Hour Holter: Mild Bradycardia, PAC's, PVC's. Pauses approximately 2 seconds. No pathological arrhytmia's

## 2013-07-28 ENCOUNTER — Other Ambulatory Visit: Payer: Self-pay | Admitting: *Deleted

## 2013-07-28 MED ORDER — AMLODIPINE BESYLATE 5 MG PO TABS: 5.0000 mg | ORAL_TABLET | Freq: Every day | ORAL | Status: DC

## 2013-07-29 ENCOUNTER — Telehealth: Payer: Self-pay | Admitting: Pharmacist

## 2013-07-29 ENCOUNTER — Ambulatory Visit (INDEPENDENT_AMBULATORY_CARE_PROVIDER_SITE_OTHER): Payer: Medicare Other | Admitting: Pharmacist

## 2013-07-29 DIAGNOSIS — I4891 Unspecified atrial fibrillation: Secondary | ICD-10-CM | POA: Diagnosis not present

## 2013-07-29 LAB — POCT INR: INR: 2

## 2013-07-29 NOTE — Telephone Encounter (Signed)
Aortic valve surgery was 05/12/13 by Dr. Darcey Nora and patient was told would be on coumadin for 3 months.  Would like to know from Dr. Irish Lack if it is okay to d/c this on 08/12/13 or if he needs to wait until he sees him in 08/2013.  Patient's phone number is [phone].  Please advise patient on length of time of his anticoagulation therapy.

## 2013-08-02 ENCOUNTER — Ambulatory Visit: Payer: Self-pay | Admitting: Pharmacist

## 2013-08-02 DIAGNOSIS — M79609 Pain in unspecified limb: Secondary | ICD-10-CM | POA: Diagnosis not present

## 2013-08-02 DIAGNOSIS — R21 Rash and other nonspecific skin eruption: Secondary | ICD-10-CM | POA: Diagnosis not present

## 2013-08-02 DIAGNOSIS — R609 Edema, unspecified: Secondary | ICD-10-CM | POA: Diagnosis not present

## 2013-08-02 DIAGNOSIS — L570 Actinic keratosis: Secondary | ICD-10-CM | POA: Diagnosis not present

## 2013-08-02 DIAGNOSIS — I4891 Unspecified atrial fibrillation: Secondary | ICD-10-CM

## 2013-08-02 DIAGNOSIS — Z954 Presence of other heart-valve replacement: Secondary | ICD-10-CM | POA: Diagnosis not present

## 2013-08-02 NOTE — Telephone Encounter (Signed)
Ok to stop in 3 months.

## 2013-08-02 NOTE — Telephone Encounter (Signed)
Ok to stop 3 months from surgery date.

## 2013-08-02 NOTE — Telephone Encounter (Signed)
Patient notified to d/c warfarin on 08/12/13 and continue aspirin 81 mg qd.  Future protime appointment cancelled.

## 2013-08-02 NOTE — Telephone Encounter (Signed)
Okay to stop warfarin 08/12/13 per Dr. Irish Lack.  Left message for patient to call so I can inform him of this.

## 2013-08-09 ENCOUNTER — Ambulatory Visit: Payer: Medicare Other | Admitting: Interventional Cardiology

## 2013-08-30 DIAGNOSIS — K2100 Gastro-esophageal reflux disease with esophagitis, without bleeding: Secondary | ICD-10-CM | POA: Diagnosis not present

## 2013-09-12 ENCOUNTER — Ambulatory Visit (INDEPENDENT_AMBULATORY_CARE_PROVIDER_SITE_OTHER): Payer: Medicare Other | Admitting: Interventional Cardiology

## 2013-09-12 ENCOUNTER — Encounter: Payer: Self-pay | Admitting: Interventional Cardiology

## 2013-09-12 VITALS — BP 145/79 | HR 73 | Ht 69.0 in | Wt 174.4 lb

## 2013-09-12 DIAGNOSIS — I2510 Atherosclerotic heart disease of native coronary artery without angina pectoris: Secondary | ICD-10-CM

## 2013-09-12 DIAGNOSIS — I359 Nonrheumatic aortic valve disorder, unspecified: Secondary | ICD-10-CM | POA: Diagnosis not present

## 2013-09-12 DIAGNOSIS — I350 Nonrheumatic aortic (valve) stenosis: Secondary | ICD-10-CM

## 2013-09-12 DIAGNOSIS — Z954 Presence of other heart-valve replacement: Secondary | ICD-10-CM

## 2013-09-12 DIAGNOSIS — I10 Essential (primary) hypertension: Secondary | ICD-10-CM | POA: Diagnosis not present

## 2013-09-12 DIAGNOSIS — Z952 Presence of prosthetic heart valve: Secondary | ICD-10-CM

## 2013-09-12 MED ORDER — AMLODIPINE BESYLATE 5 MG PO TABS: 5.0000 mg | ORAL_TABLET | Freq: Every day | ORAL | Status: DC

## 2013-09-12 NOTE — Patient Instructions (Signed)
Your physician has recommended you make the following change in your medication:   1. Stop Amiodarone.   Refilled Amlodipine 5 mg 1 tablet Once a day to [facility].   Your physician wants you to follow-up in: 6 months with Dr. Irish Lack. You will receive a reminder letter in the mail two months in advance. If you don't receive a letter, please call our office to schedule the follow-up appointment.

## 2013-09-12 NOTE — Progress Notes (Signed)
Patient ID: Robert [name], male   DOB: March 29, 1927, 80 y.o.   MRN: [id number] Patient ID: Robert [name], male   DOB: 10-Apr-1927, 80 y.o.   MRN: [id number]    [place], [place], [place]  [address] Phone: [phone] Fax:  [phone]  Date:  09/12/2013   ID:  Robert [name], DOB [birth year], MRN [id number]  PCP:  Henrine Screws, MD      History of Present Illness: Robert [name] is a 80 y.o. male who had a BS to the circumflex a few years ago.  He had AFib in 2/15.  Workup revealed severe AS.  He had AVR with single vessel CABG, Maze procedure and left atrial clip in 2015.  He had presyncope and his HR was in the 40s.  Exercise tolerance was decreasing signficantly as well. His beta blocker and digoxin were stopped and he has noted an improvement.  He walked a mile yesterday, for the first time yesterday.  No CP, SHOB, swelling.  No trouble with coumadin.  He would like to come off of coumadin.    BP has increased since stopping the metoprolol.  He has taken Benicar in the past.  He had a rash, presumed to be from Lasix.  He was switched to Spironolactone.  Amlodipine was added at the last visit. BP at home has been in the 938-101 range systolic.    He his back to doing yard work.  He goes to the gym.  He feels that he is getting stronger everyday.  Wt Readings from Last 3 Encounters:  09/12/13 174 lb 6.4 oz (79.107 kg)  07/18/13 172 lb (78.019 kg)  07/15/13 172 lb 1.9 oz (78.073 kg)     Past Medical History  Diagnosis Date  . Hypertension   . Prostate cancer   . Hyperlipidemia   . Carpal tunnel syndrome     bilateral, carpal tunnel release x 2  . PAF (paroxysmal atrial fibrillation)   . Polymyalgia rheumatica   . GERD (gastroesophageal reflux disease)     EGD, Dr. Toney Rakes 2008  . Esophagitis     a. 05/2009 a/w GIB.  [name] Esophageal ulcer     a. 05/2009 with hemorrhage - injected with epinephrine-Dr. Herbie Baltimore Buccini.  . Vitamin D  deficiency   . Blood loss anemia     a. 05/2009 a/w GIB.  [name] Hx of colonoscopy 2008    Dr. Toney Rakes, polyps- 5 yr f/u recommended  . Perforated ear drum     right ear drum, tube in left ear drum, Dr. Constance Holster  . GI bleed     a. 05/2009 as above: with associated anemia. Lower esophageal ulcer with extensive erosive esophagitis and large clot by EGD 05/2009.   [name] CAD (coronary artery disease)     a. Cath 04/2009: BMS to LCx 04/2009; CTO of RCA with L-R collaterals, 40% ostial diag.  . Aortic stenosis     a. s/p tissue AVR 05/2013;  b. Echo (06/2013):  Mod LVH, EF 60-65%, no RWMA, Gr 2 DD, AVR ok (mean 13 mmHg), MAC, mild MR, mod LAE, mild RAE, PASP 46 mmHg (mild pulmo HTN)    Current Outpatient Prescriptions  Medication Sig Dispense Refill  . amiodarone (PACERONE) 200 MG tablet 200 mg daily  70 tablet  1  . amLODipine (NORVASC) 5 MG tablet Take 1 tablet (5 mg total) by mouth daily.  90 tablet  0  . aspirin EC 81 MG EC tablet Take  1 tablet (81 mg total) by mouth daily.      . cholecalciferol (VITAMIN D) 1000 UNITS tablet Take 1,000 Units by mouth daily.      . Omega-3 Fatty Acids (FISH OIL) 1000 MG CAPS Take 2 capsules by mouth daily.      [name] omeprazole (PRILOSEC) 20 MG capsule Take 20 mg by mouth daily.      . simvastatin (ZOCOR) 40 MG tablet Take 40 mg by mouth daily.       No current facility-administered medications for this visit.    Allergies:    Allergies  Allergen Reactions  . Lasix [Furosemide]   . Lisinopril Cough  . Kenalog [Triamcinolone Acetonide] Rash    Social History:  The patient  reports that he has never smoked. He does not have any smokeless tobacco history on file. He reports that he does not drink alcohol or use illicit drugs.   Family History:  The patient's family history includes Breast cancer in his daughter; CVA in his mother; Kidney disease in his father.   ROS:  Please see the history of present illness.  No nausea, vomiting.  No fevers, chills.  No focal  weakness.  No dysuria.    All other systems reviewed and negative.   PHYSICAL EXAM: VS:  BP 145/79  Pulse 73  Ht 5\' 9"  (1.753 m)  Wt 174 lb 6.4 oz (79.107 kg)  BMI 25.74 kg/m2 Well nourished, well developed, in no acute distress HEENT: normal Neck: no JVD, no carotid bruits Cardiac:  normal S1, S2; RRR;  Lungs:  clear to auscultation bilaterally, no wheezing, rhonchi or rales Abd: soft, nontender, no hepatomegaly Ext: no edema Skin: warm and dry Neuro:   no focal abnormalities noted  EKG:  NSR, NSST     ASSESSMENT AND PLAN:  1. AVR: He took Coumadin for 3 months post valve replacement. 2. AFib: Stop amiodarone to 200 mg daily. Now in normal sinus rhythm. Left atrial appendage was clipped. Now off of Coumadin.  He has had a GI bleed in the past. S/p Maze. 3. CAD: Status post SVG RCA bypass. Patent circumflex stent. No angina. 4. Presyncope: This has resolved since rate slowing drugs have been decreased. 5. HTN: Elevated. Continue  amlodipine 5 mg daily.  Refills called in.    Signed, Mina Marble, MD, [facility] 09/12/2013 8:33 AM

## 2013-11-10 ENCOUNTER — Ambulatory Visit: Payer: Self-pay | Admitting: Interventional Cardiology

## 2013-11-10 ENCOUNTER — Other Ambulatory Visit: Payer: Self-pay | Admitting: Dermatology

## 2013-11-10 DIAGNOSIS — C4441 Basal cell carcinoma of skin of scalp and neck: Secondary | ICD-10-CM | POA: Diagnosis not present

## 2013-11-10 DIAGNOSIS — D485 Neoplasm of uncertain behavior of skin: Secondary | ICD-10-CM | POA: Diagnosis not present

## 2013-11-10 DIAGNOSIS — C44621 Squamous cell carcinoma of skin of unspecified upper limb, including shoulder: Secondary | ICD-10-CM | POA: Diagnosis not present

## 2013-11-10 DIAGNOSIS — L820 Inflamed seborrheic keratosis: Secondary | ICD-10-CM | POA: Diagnosis not present

## 2013-11-10 DIAGNOSIS — L570 Actinic keratosis: Secondary | ICD-10-CM | POA: Diagnosis not present

## 2013-11-10 DIAGNOSIS — Z85828 Personal history of other malignant neoplasm of skin: Secondary | ICD-10-CM | POA: Diagnosis not present

## 2014-02-09 ENCOUNTER — Encounter (HOSPITAL_COMMUNITY): Payer: Self-pay | Admitting: Cardiovascular Disease

## 2014-02-16 DIAGNOSIS — E559 Vitamin D deficiency, unspecified: Secondary | ICD-10-CM | POA: Diagnosis not present

## 2014-02-16 DIAGNOSIS — Z8546 Personal history of malignant neoplasm of prostate: Secondary | ICD-10-CM | POA: Diagnosis not present

## 2014-02-16 DIAGNOSIS — Z1389 Encounter for screening for other disorder: Secondary | ICD-10-CM | POA: Diagnosis not present

## 2014-02-16 DIAGNOSIS — M25579 Pain in unspecified ankle and joints of unspecified foot: Secondary | ICD-10-CM | POA: Diagnosis not present

## 2014-02-16 DIAGNOSIS — Z79899 Other long term (current) drug therapy: Secondary | ICD-10-CM | POA: Diagnosis not present

## 2014-02-16 DIAGNOSIS — Z0001 Encounter for general adult medical examination with abnormal findings: Secondary | ICD-10-CM | POA: Diagnosis not present

## 2014-02-16 DIAGNOSIS — Z23 Encounter for immunization: Secondary | ICD-10-CM | POA: Diagnosis not present

## 2014-02-16 DIAGNOSIS — D500 Iron deficiency anemia secondary to blood loss (chronic): Secondary | ICD-10-CM | POA: Diagnosis not present

## 2014-02-16 DIAGNOSIS — I10 Essential (primary) hypertension: Secondary | ICD-10-CM | POA: Diagnosis not present

## 2014-02-16 DIAGNOSIS — I2510 Atherosclerotic heart disease of native coronary artery without angina pectoris: Secondary | ICD-10-CM | POA: Diagnosis not present

## 2014-03-13 ENCOUNTER — Ambulatory Visit (INDEPENDENT_AMBULATORY_CARE_PROVIDER_SITE_OTHER): Payer: Medicare Other | Admitting: Interventional Cardiology

## 2014-03-13 ENCOUNTER — Encounter: Payer: Self-pay | Admitting: Interventional Cardiology

## 2014-03-13 VITALS — BP 140/70 | HR 74 | Ht 69.0 in | Wt 180.0 lb

## 2014-03-13 DIAGNOSIS — Z954 Presence of other heart-valve replacement: Secondary | ICD-10-CM | POA: Diagnosis not present

## 2014-03-13 DIAGNOSIS — E782 Mixed hyperlipidemia: Secondary | ICD-10-CM | POA: Diagnosis not present

## 2014-03-13 DIAGNOSIS — I350 Nonrheumatic aortic (valve) stenosis: Secondary | ICD-10-CM | POA: Diagnosis not present

## 2014-03-13 DIAGNOSIS — I4891 Unspecified atrial fibrillation: Secondary | ICD-10-CM | POA: Diagnosis not present

## 2014-03-13 DIAGNOSIS — Z952 Presence of prosthetic heart valve: Secondary | ICD-10-CM

## 2014-03-13 DIAGNOSIS — I10 Essential (primary) hypertension: Secondary | ICD-10-CM

## 2014-03-13 NOTE — Progress Notes (Signed)
Patient ID: Robert [name], male   DOB: [birth year], 81 y.o.   MRN: [id number]    [place], [place], [place]  [address] Phone: [phone] Fax:  [phone]  Date:  03/13/2014   ID:  Robert [name], DOB January 09, 1928, MRN [id number]  PCP:  Henrine Screws, MD      History of Present Illness: Robert [name] is a 81 y.o. male who had a BMS to the circumflex in 2011.  He had AFib in 2/15.  Workup revealed severe AS.  He had AVR with single vessel CABG, Maze procedure and left atrial clip in 2015.  He had presyncope and his HR was in the 40s.  Resolved with stopping beta blocker and digoxin.  No CP, SHOB, swelling.     Amlodipine was added. BP at home has been in the 440-102 range systolic.    He his back to doing yard work.  He goes to the gym.  He feels that he is doing well.  Wt Readings from Last 3 Encounters:  03/13/14 180 lb (81.647 kg)  09/12/13 174 lb 6.4 oz (79.107 kg)  07/18/13 172 lb (78.019 kg)     Past Medical History  Diagnosis Date  . Hypertension   . Prostate cancer   . Hyperlipidemia   . Carpal tunnel syndrome     bilateral, carpal tunnel release x 2  . PAF (paroxysmal atrial fibrillation)   . Polymyalgia rheumatica   . GERD (gastroesophageal reflux disease)     EGD, Dr. Toney Rakes 2008  . Esophagitis     a. 05/2009 a/w GIB.  [name] Esophageal ulcer     a. 05/2009 with hemorrhage - injected with epinephrine-Dr. Herbie Baltimore Buccini.  . Vitamin D deficiency   . Blood loss anemia     a. 05/2009 a/w GIB.  [name] Hx of colonoscopy 2008    Dr. Toney Rakes, polyps- 5 yr f/u recommended  . Perforated ear drum     right ear drum, tube in left ear drum, Dr. Constance Holster  . GI bleed     a. 05/2009 as above: with associated anemia. Lower esophageal ulcer with extensive erosive esophagitis and large clot by EGD 05/2009.   [name] CAD (coronary artery disease)     a. Cath 04/2009: BMS to LCx 04/2009; CTO of RCA with L-R collaterals, 40% ostial diag.  .  Aortic stenosis     a. s/p tissue AVR 05/2013;  b. Echo (06/2013):  Mod LVH, EF 60-65%, no RWMA, Gr 2 DD, AVR ok (mean 13 mmHg), MAC, mild MR, mod LAE, mild RAE, PASP 46 mmHg (mild pulmo HTN)    Current Outpatient Prescriptions  Medication Sig Dispense Refill  . amLODipine (NORVASC) 5 MG tablet Take 1 tablet (5 mg total) by mouth daily. 90 tablet 3  . aspirin EC 81 MG EC tablet Take 1 tablet (81 mg total) by mouth daily.    . cholecalciferol (VITAMIN D) 1000 UNITS tablet Take 1,000 Units by mouth daily.    . Omega-3 Fatty Acids (FISH OIL) 1000 MG CAPS Take 2 capsules by mouth daily.    [name] omeprazole (PRILOSEC) 20 MG capsule Take 20 mg by mouth daily.    . simvastatin (ZOCOR) 40 MG tablet Take 40 mg by mouth daily.     No current facility-administered medications for this visit.    Allergies:    Allergies  Allergen Reactions  . Lasix [Furosemide]   . Lisinopril Cough  . Kenalog [Triamcinolone Acetonide] Rash  Social History:  The patient  reports that he has never smoked. He does not have any smokeless tobacco history on file. He reports that he does not drink alcohol or use illicit drugs.   Family History:  The patient's family history includes Breast cancer in his daughter; CVA in his mother; Kidney disease in his father.   ROS:  Please see the history of present illness.  No nausea, vomiting.  No fevers, chills.  No focal weakness.  No dysuria.    All other systems reviewed and negative.   PHYSICAL EXAM: VS:  BP 140/70 mmHg  Pulse 74  Ht 5\' 9"  (1.753 m)  Wt 180 lb (81.647 kg)  BMI 26.57 kg/m2 Well nourished, well developed, in no acute distress HEENT: normal Neck: no JVD, no carotid bruits Cardiac:  normal S1, S2; RRR;  Lungs:  clear to auscultation bilaterally, no wheezing, rhonchi or rales Abd: soft, nontender, no hepatomegaly Ext: no edema Skin: warm and dry Neuro:   no focal abnormalities noted Psych: normal affect  EKG:  NSR, NSST     ASSESSMENT AND  PLAN:  1. AVR: He took Coumadin for 3 months post valve replacement. No problems with exercise.  2. AFib: Off of amiodarone with no palpitations. Now in normal sinus rhythm. Left atrial appendage was clipped. Now off of Coumadin.  He has had a GI bleed in the past. S/p Maze. 3. CAD: Status post SVG RCA bypass. Patent circumflex stent. No angina.  Lipids followed by his PMD. 4. Presyncope: This has resolved since rate slowing drugs have been decreased. 5. HTN: Elevated. Continue  amlodipine 5 mg daily.  Readings at home are in the 120s/70s range.  One elveated reading with Dr. Inda Merlin, when he did not take his meds.     Signed, Mina Marble, MD, [facility] 03/13/2014 10:31 AM

## 2014-03-13 NOTE — Patient Instructions (Signed)
Your physician recommends that you continue on your current medications as directed. Please refer to the Current Medication list given to you today.  Your physician wants you to follow-up in: 1 year with Dr. Varanasi. You will receive a reminder letter in the mail two months in advance. If you don't receive a letter, please call our office to schedule the follow-up appointment.  

## 2014-04-30 ENCOUNTER — Emergency Department (HOSPITAL_COMMUNITY): Payer: Medicare Other

## 2014-04-30 ENCOUNTER — Encounter (HOSPITAL_COMMUNITY): Payer: Self-pay | Admitting: Emergency Medicine

## 2014-04-30 ENCOUNTER — Telehealth: Payer: Self-pay | Admitting: Physician Assistant

## 2014-04-30 ENCOUNTER — Emergency Department (HOSPITAL_COMMUNITY)
Admission: EM | Admit: 2014-04-30 | Discharge: 2014-04-30 | Disposition: A | Discharge status: Home / Self Care | Payer: Medicare Other | Attending: Emergency Medicine | Admitting: Emergency Medicine

## 2014-04-30 DIAGNOSIS — I4892 Unspecified atrial flutter: Secondary | ICD-10-CM | POA: Insufficient documentation

## 2014-04-30 DIAGNOSIS — K219 Gastro-esophageal reflux disease without esophagitis: Secondary | ICD-10-CM | POA: Insufficient documentation

## 2014-04-30 DIAGNOSIS — Z91040 Latex allergy status: Secondary | ICD-10-CM | POA: Insufficient documentation

## 2014-04-30 DIAGNOSIS — Z7901 Long term (current) use of anticoagulants: Secondary | ICD-10-CM | POA: Diagnosis not present

## 2014-04-30 DIAGNOSIS — E785 Hyperlipidemia, unspecified: Secondary | ICD-10-CM | POA: Insufficient documentation

## 2014-04-30 DIAGNOSIS — Z79899 Other long term (current) drug therapy: Secondary | ICD-10-CM | POA: Insufficient documentation

## 2014-04-30 DIAGNOSIS — Z7982 Long term (current) use of aspirin: Secondary | ICD-10-CM | POA: Diagnosis not present

## 2014-04-30 DIAGNOSIS — J9811 Atelectasis: Secondary | ICD-10-CM | POA: Diagnosis not present

## 2014-04-30 DIAGNOSIS — I2510 Atherosclerotic heart disease of native coronary artery without angina pectoris: Secondary | ICD-10-CM | POA: Insufficient documentation

## 2014-04-30 DIAGNOSIS — Z862 Personal history of diseases of the blood and blood-forming organs and certain disorders involving the immune mechanism: Secondary | ICD-10-CM | POA: Diagnosis not present

## 2014-04-30 DIAGNOSIS — I517 Cardiomegaly: Secondary | ICD-10-CM | POA: Diagnosis not present

## 2014-04-30 DIAGNOSIS — Z8546 Personal history of malignant neoplasm of prostate: Secondary | ICD-10-CM | POA: Diagnosis not present

## 2014-04-30 DIAGNOSIS — R000 Tachycardia, unspecified: Secondary | ICD-10-CM | POA: Diagnosis present

## 2014-04-30 DIAGNOSIS — Z9889 Other specified postprocedural states: Secondary | ICD-10-CM | POA: Diagnosis not present

## 2014-04-30 DIAGNOSIS — I10 Essential (primary) hypertension: Secondary | ICD-10-CM | POA: Diagnosis not present

## 2014-04-30 LAB — COMPREHENSIVE METABOLIC PANEL
ALT: 16 U/L (ref 0–53)
AST: 21 U/L (ref 0–37)
Albumin: 4 g/dL (ref 3.5–5.2)
Alkaline Phosphatase: 41 U/L (ref 39–117)
Anion gap: 7 (ref 5–15)
BUN: 14 mg/dL (ref 6–23)
CO2: 23 mmol/L (ref 19–32)
Calcium: 9 mg/dL (ref 8.4–10.5)
Chloride: 107 mmol/L (ref 96–112)
Creatinine, Ser: 0.96 mg/dL (ref 0.50–1.35)
GFR calc Af Amer: 86 mL/min — ABNORMAL LOW (ref >90)
GFR calc non Af Amer: 74 mL/min — ABNORMAL LOW (ref >90)
Glucose, Bld: 108 mg/dL — ABNORMAL HIGH (ref 70–99)
Potassium: 3.7 mmol/L (ref 3.5–5.1)
Sodium: 137 mmol/L (ref 135–145)
Total Bilirubin: 0.4 mg/dL (ref 0.3–1.2)
Total Protein: 6.6 g/dL (ref 6.0–8.3)

## 2014-04-30 LAB — CBC WITH DIFFERENTIAL/PLATELET
Basophils Absolute: 0 10*3/uL (ref 0.0–0.1)
Basophils Relative: 1 % (ref 0–1)
Eosinophils Absolute: 0.1 10*3/uL (ref 0.0–0.7)
Eosinophils Relative: 2 % (ref 0–5)
HCT: 37.6 % — ABNORMAL LOW (ref 39.0–52.0)
Hemoglobin: 12.7 g/dL — ABNORMAL LOW (ref 13.0–17.0)
Lymphocytes Relative: 20 % (ref 12–46)
Lymphs Abs: 1.3 10*3/uL (ref 0.7–4.0)
MCH: 30.5 pg (ref 26.0–34.0)
MCHC: 33.8 g/dL (ref 30.0–36.0)
MCV: 90.4 fL (ref 78.0–100.0)
Monocytes Absolute: 0.5 10*3/uL (ref 0.1–1.0)
Monocytes Relative: 8 % (ref 3–12)
Neutro Abs: 4.8 10*3/uL (ref 1.7–7.7)
Neutrophils Relative %: 69 % (ref 43–77)
Platelets: 167 10*3/uL (ref 150–400)
RBC: 4.16 MIL/uL — ABNORMAL LOW (ref 4.22–5.81)
RDW: 15.5 % (ref 11.5–15.5)
WBC: 6.8 10*3/uL (ref 4.0–10.5)

## 2014-04-30 MED ORDER — APIXABAN 5 MG PO TABS
5.0000 mg | ORAL_TABLET | Freq: Two times a day (BID) | ORAL | Status: DC
  Administered 2014-04-30: 5 mg via ORAL
  Filled 2014-04-30: qty 1

## 2014-04-30 MED ORDER — DEXTROSE 5 % IV SOLN
5.0000 mg/h | INTRAVENOUS | Status: DC
  Administered 2014-04-30: 5 mg/h via INTRAVENOUS

## 2014-04-30 MED ORDER — DILTIAZEM HCL 25 MG/5ML IV SOLN
20.0000 mg | Freq: Once | INTRAVENOUS | Status: AC
  Administered 2014-04-30: 20 mg via INTRAVENOUS
  Filled 2014-04-30: qty 5

## 2014-04-30 MED ORDER — METOPROLOL SUCCINATE ER 50 MG PO TB24: 50.0000 mg | ORAL_TABLET | Freq: Two times a day (BID) | ORAL | Status: DC

## 2014-04-30 MED ORDER — APIXABAN 5 MG PO TABS: 5.0000 mg | ORAL_TABLET | Freq: Two times a day (BID) | ORAL | Status: DC

## 2014-04-30 MED ORDER — SODIUM CHLORIDE 0.9 % IV BOLUS (SEPSIS)
500.0000 mL | Freq: Once | INTRAVENOUS | Status: AC
  Administered 2014-04-30: 500 mL via INTRAVENOUS

## 2014-04-30 MED ORDER — DILTIAZEM LOAD VIA INFUSION
10.0000 mg | Freq: Once | INTRAVENOUS | Status: AC
  Administered 2014-04-30: 10 mg via INTRAVENOUS
  Filled 2014-04-30: qty 10

## 2014-04-30 NOTE — ED Provider Notes (Signed)
CSN: [id number]     Arrival date & time 04/30/14  1711 History   First MD Initiated Contact with Patient 04/30/14 1724     Chief Complaint  Patient presents with  . Tachycardia     (Consider location/radiation/quality/duration/timing/severity/associated sxs/prior Treatment) HPI    PCP: GATES,ROBERT NEVILL, MD Blood pressure 147/95, pulse 148, temperature 98.2 F (36.8 C), resp. rate 18, SpO2 95 %.  Cardiac cath in 2011 and 2015 with bypass and aortic valve replacement in March 2015.   Robert [name] is a 81 y.o.male with a significant PMH of hypertension, prostate cancer, hyperlipidemia, carpal tunnel syndrome, PAF, polymyalgia rheumatica, GERD, esophagitis, blood loss anemia, CAD presents to the ER with complaints of tachycardia. Wife reports they check vitals multiple times per day and yesterday morning his pulse was elevated at 120's throughout the day it steadily increased to upper 130's. Today it has been in the 140's through out the day. He called his cardiologist who recommend they take 1/2 of a Cardizem tab, this did not work and the wife was afraid to give another dose therefore he came to the ER.  - Patient takes only a baby aspirin for blood thinner. Negative Review of Symptoms: He denies feeling any symptoms of palpitations, weakness, LE swelling, CP, SOB, dizziness, headache, or confusion.   Past Medical History  Diagnosis Date  . Hypertension   . Prostate cancer   . Hyperlipidemia   . Carpal tunnel syndrome     bilateral, carpal tunnel release x 2  . PAF (paroxysmal atrial fibrillation)   . Polymyalgia rheumatica   . GERD (gastroesophageal reflux disease)     EGD, Dr. Toney Rakes 2008  . Esophagitis     a. 05/2009 a/w GIB.  [name] Esophageal ulcer     a. 05/2009 with hemorrhage - injected with epinephrine-Dr. Herbie Baltimore Buccini.  . Vitamin D deficiency   . Blood loss anemia     a. 05/2009 a/w GIB.  [name] Hx of colonoscopy 2008    Dr. Toney Rakes, polyps- 5 yr  f/u recommended  . Perforated ear drum     right ear drum, tube in left ear drum, Dr. Constance Holster  . GI bleed     a. 05/2009 as above: with associated anemia. Lower esophageal ulcer with extensive erosive esophagitis and large clot by EGD 05/2009.   [name] CAD (coronary artery disease)     a. Cath 04/2009: BMS to LCx 04/2009; CTO of RCA with L-R collaterals, 40% ostial diag.  . Aortic stenosis     a. s/p tissue AVR 05/2013;  b. Echo (06/2013):  Mod LVH, EF 60-65%, no RWMA, Gr 2 DD, AVR ok (mean 13 mmHg), MAC, mild MR, mod LAE, mild RAE, PASP 46 mmHg (mild pulmo HTN)   Past Surgical History  Procedure Laterality Date  . Cardiac catheterization  2011  . Prostatectomy    . Coronary artery bypass graft N/A 05/12/2013    Procedure: CORONARY ARTERY BYPASS GRAFTING (CABG);  Surgeon: Ivin Poot, MD;  Location: [place] [facility];  Service: Open Heart Surgery;  Laterality: N/A;  CABG x 1 using left leg greater saphenous vein harvested endoscopically  . Aortic valve replacement N/A 05/12/2013    Procedure: AORTIC VALVE REPLACEMENT (AVR);  Surgeon: Ivin Poot, MD;  Location: [place];  Service: Open Heart Surgery;  Laterality: N/A;  . Intraoperative transesophageal echocardiogram N/A 05/12/2013    Procedure: INTRAOPERATIVE TRANSESOPHAGEAL ECHOCARDIOGRAM;  Surgeon: Ivin Poot, MD;  Location: [place];  Service: Open Heart Surgery;  Laterality: N/A;  . Maze N/A 05/12/2013    Procedure: MAZE;  Surgeon: Ivin Poot, MD;  Location: [place];  Service: Open Heart Surgery;  Laterality: N/A;  . Left and right heart catheterization with coronary angiogram N/A 05/09/2013    Procedure: LEFT AND RIGHT HEART CATHETERIZATION WITH CORONARY ANGIOGRAM;  Surgeon: Lorretta Harp, MD;  Location: [facility] CATH LAB;  Service: Cardiovascular;  Laterality: N/A;   Family History  Problem Relation Age of Onset  . CVA Mother   . Kidney disease Father   . Breast cancer Daughter    History  Substance Use Topics  . Smoking status: Never Smoker   .  Smokeless tobacco: Not on file  . Alcohol Use: No    Review of Systems  10 Systems reviewed and are negative for acute change except as noted in the HPI.   Allergies  Lasix; Lisinopril; and Kenalog  Home Medications   Prior to Admission medications   Medication Sig Start Date End Date Taking? Authorizing Provider  amLODipine (NORVASC) 5 MG tablet Take 1 tablet (5 mg total) by mouth daily. 09/12/13  Yes Jettie Booze, MD  aspirin EC 81 MG EC tablet Take 1 tablet (81 mg total) by mouth daily. 05/18/13  Yes Coolidge Breeze, PA-C  cholecalciferol (VITAMIN D) 1000 UNITS tablet Take 1,000 Units by mouth daily.   Yes Historical Provider, MD  Omega-3 Fatty Acids (FISH OIL) 1000 MG CAPS Take 2 capsules by mouth daily.   Yes Historical Provider, MD  omeprazole (PRILOSEC) 20 MG capsule Take 20 mg by mouth daily.   Yes Historical Provider, MD  simvastatin (ZOCOR) 40 MG tablet Take 40 mg by mouth daily.   Yes Historical Provider, MD  TURMERIC PO Take 1 tablet by mouth daily.   Yes Historical Provider, MD  apixaban (ELIQUIS) 5 MG TABS tablet Take 1 tablet (5 mg total) by mouth 2 (two) times daily. 04/30/14   Linus Mako, PA-C  apixaban (ELIQUIS) 5 MG TABS tablet Take 1 tablet (5 mg total) by mouth 2 (two) times daily. 04/30/14   Royce Sciara Marilu Favre, PA-C  metoprolol succinate (TOPROL XL) 50 MG 24 hr tablet Take 1 tablet (50 mg total) by mouth 2 (two) times daily. Take with or immediately following a meal. 04/30/14   Railynn Ballo Marilu Favre, PA-C   BP 133/67 mmHg  Pulse 96  Temp(Src) 98.2 F (36.8 C)  Resp 20  SpO2 96% Physical Exam  Constitutional: He appears well-developed and well-nourished. No distress.  HENT:  Head: Normocephalic and atraumatic.  Eyes: Pupils are equal, round, and reactive to light.  Neck: Normal range of motion. Neck supple.  Cardiovascular: A regularly irregular rhythm present. Tachycardia present.   Pulmonary/Chest: Effort normal and breath sounds normal. He has no  wheezes.  Abdominal: Soft.  Musculoskeletal:  No LE swelling  Neurological: He is alert.  Skin: Skin is warm and dry. He is not diaphoretic.  Nursing note and vitals reviewed.   ED Course  Procedures (including critical care time) Labs Review Labs Reviewed  CBC WITH DIFFERENTIAL/PLATELET - Abnormal; Notable for the following:    RBC 4.16 (*)    Hemoglobin 12.7 (*)    HCT 37.6 (*)    All other components within normal limits  COMPREHENSIVE METABOLIC PANEL - Abnormal; Notable for the following:    Glucose, Bld 108 (*)    GFR calc non Af Amer 74 (*)    GFR calc Af Amer 86 (*)    All  other components within normal limits    Imaging Review Dg Chest 2 View  04/30/2014   CLINICAL DATA:  High heart rate.  EXAM: CHEST  2 VIEW  COMPARISON:  06/08/2013  FINDINGS: Prior CABG and valve replacement. There is cardiomegaly. Bibasilar atelectasis noted. No effusions. No acute bony abnormality.  IMPRESSION: Cardiomegaly.  Bibasilar atelectasis appear   Electronically Signed   By: Rolm Baptise M.D.   On: 04/30/2014 18:41     EKG Interpretation   Date/Time:  Sunday April 30 2014 18:46:54 EST Ventricular Rate:  103 PR Interval:  120 QRS Duration: 82 QT Interval:  443 QTC Calculation: 580 R Axis:   -62 Text Interpretation:  Atrial flutter Left anterior fascicular block  Abnormal R-wave progression, early transition Nonspecific repol  abnormality, lateral leads Prolonged QT interval No significant change was  found Confirmed by CAMPOS  MD, Lennette Bihari ([id number]) on 04/30/2014 8:00:24 PM      MDM   Final diagnoses:  Tachycardia  Atrial flutter, unspecified   5: 40 pm I discussed the case with Dr. Venora Maples, Recommends bolus Cardizem, re-evaluate and then start Cardizem drip if he does not convert.  Medications  diltiazem (CARDIZEM) 1 mg/mL load via infusion 10 mg (0 mg Intravenous Stopped 04/30/14 1945)    And  diltiazem (CARDIZEM) 100 mg in dextrose 5 % 100 mL (1 mg/mL) infusion (5 mg/hr  Intravenous New Bag/Given 04/30/14 1934)  apixaban (ELIQUIS) tablet 5 mg (not administered)  diltiazem (CARDIZEM) injection 20 mg (0 mg Intravenous Stopped 04/30/14 1758)  sodium chloride 0.9 % bolus 500 mL (0 mLs Intravenous Stopped 04/30/14 1909)     8:51 pm  Patient was seen by Dr. Venora Maples as well. We were able to control the patients rate but he did not convert. He continues to be asymptomatic. We spoke with Dr. Wynonia Lawman, on-call cards and let him know about the patient and that the patient prefers to go home. He recommends 50 mg Toprolol BID 5 mg Eloquis  And that he will need to see Dr. Livingston Diones tomorrow. I left Dr. Irish Lack a message to his inbox about the patient and the plan. The patient has been given strict return to ED precautions, is asymptomatic and has no signs of heart failure. Dr. Venora Maples is aware of the plan for the patient and in agreement. The family/patient have been updated and they are comfortable with the plan and understand if he develops symptoms or becomes tachycardic again he needs to come to the ER.  81 y.o.Robert [name]'s evaluation in the Emergency Department is complete. It has been determined that no acute conditions requiring further emergency intervention are present at this time. The patient/guardian have been advised of the diagnosis and plan. We have discussed signs and symptoms that warrant return to the ED, such as changes or worsening in symptoms.  Vital signs are stable at discharge. Filed Vitals:   04/30/14 2045  BP: 133/67  Pulse: 96  Temp:   Resp: 20    Patient/guardian has voiced understanding and agreed to follow-up with the PCP or specialist.   Linus Mako, PA-C 04/30/14 9024  Hoy Morn, MD 04/30/14 [phone]

## 2014-04-30 NOTE — ED Notes (Signed)
Pt c/o tachycardia with some palpitations x 2 days

## 2014-04-30 NOTE — Discharge Instructions (Signed)
RETURN TO THE ER IF YOUR PULSE INCREASES OR IF YOU CAN NOT BE SEEN BY CARDIOLOGY ON  Monday or Tuesday   Atrial Flutter Atrial flutter is a heart rhythm that can cause the heart to beat very fast (tachycardia). It originates in the upper chambers of the heart (atria). In atrial flutter, the top chambers of the heart (atria) often beat much faster than the bottom chambers of the heart (ventricles). Atrial flutter has a regular "saw toothed" appearance in an EKG readout. An EKG is a test that records the electrical activity of the heart. Atrial flutter can cause the heart to beat up to 150 beats per minute (BPM). Atrial flutter can either be short lived (paroxysmal) or permanent.  CAUSES  Causes of atrial flutter can be many. Some of these include:  Heart related issues:  Heart attack (myocardial infarction).  Heart failure.  Heart valve problems.  Poorly controlled high blood pressure (hypertension).  After open heart surgery.  Lung related issues:  A blood clot in the lungs (pulmonary embolism).  Chronic obstructive pulmonary disease (COPD). Medications used to treat COPD can attribute to atrial flutter.  Other related causes:  Hyperthyroidism.  Caffeine.  Some decongestant cold medications.  Low electrolyte levels such as potassium or magnesium.  Cocaine. SYMPTOMS  An awareness of your heart beating rapidly (palpitations).  Shortness of breath.  Chest pain.  Low blood pressure (hypotension).  Dizziness or fainting. DIAGNOSIS  Different tests can be performed to diagnose atrial flutter.   An EKG.  Holter monitor. This is a 24-hour recording of your heart rhythm. You will also be given a diary. Write down all symptoms that you have and what you were doing at the time you experienced symptoms.  Cardiac event monitor. This small device can be worn for up to 30 days. When you have heart symptoms, you will push a button on the device. This will then record your heart  rhythm.  Echocardiogram. This is an imaging test to look at your heart. Your caregiver will look at your heart valves and the ventricles.  Stress test. This test can help determine if the atrial flutter is related to exercise or if coronary artery disease is present.  Laboratory studies will look at certain blood levels like:  Complete blood count (CBC).  Potassium.  Magnesium.  Thyroid function. TREATMENT  Treatment of atrial flutter varies. A combination of therapies may be used or sometimes atrial flutter may need only 1 type of treatment.  Lab work: If your blood work, such as your electrolytes (potassium, magnesium) or your thyroid function tests, are abnormal, your caregiver will treat them accordingly.  Medication:  There are several different types of medications that can convert your heart to a normal rhythm and prevent atrial flutter from reoccurring.  Nonsurgical procedures: Nonsurgical techniques may be used to control atrial flutter. Some examples include:  Cardioversion. This technique uses either drugs or an electrical shock to restore a normal heart rhythm:  Cardioversion drugs may be given through an intravenous (IV) line to help "reset" the heart rhythm.  In electrical cardioversion, your caregiver shocks your heart with electrical energy. This helps to reset the heartbeat to a normal rhythm.  Ablation. If atrial flutter is a persistent problem, an ablation may be needed. This procedure is done under mild sedation. High frequency radio-wave energy is used to destroy the area of heart tissue responsible for atrial flutter. SEEK IMMEDIATE MEDICAL CARE IF:  You have:  Dizziness.  Near fainting or  fainting.  Shortness of breath.  Chest pain or pressure.  Sudden nausea or vomiting.  Profuse sweating. If you have the above symptoms, call your local emergency service immediately! Do not drive yourself to the hospital. MAKE SURE YOU:   Understand these  instructions.  Will watch your condition.  Will get help right away if you are not doing well or get worse. Document Released: 07/06/2008 Document Revised: 07/04/2013 Document Reviewed: 07/06/2008 [facility] Patient Information 2015 [place], [place]. This information is not intended to replace advice given to you by your health care provider. Make sure you discuss any questions you have with your health care provider.

## 2014-04-30 NOTE — ED Notes (Signed)
Dr. Venora Maples and Tiffany PA at bedside

## 2014-04-30 NOTE — Telephone Encounter (Signed)
Received a call from patient's wife regarding his heart rate. It had jumped up to the 140s all of a sudden and she was concerned that he had gone back into atrial fibrillation. He doesn't feel too bad right now and his systolic blood pressure is holding steady in the 120s. What to do?  Had her look in the discontinued medicines box and there was some Cardizem 60 mg there. It was filled in March 2015, so is not yet expired. Advised her that options were to bring him on to the emergency room or try giving him a half of one of these pills, giving the other half if it helped but did not resolve the situation and bring him in later if he did not slow down. Robert [name] does not feel bad right now and wishes to try medications at home prior to coming to the emergency room. Robert [name] is aware that if he gets more symptomatic, his blood pressure drops ors heart rate doesn't slow down she needs to bring him in the hospital this evening. She states she will do so.  Rosaria Ferries, PA-C 04/30/2014 3:05 PM Beeper [phone]

## 2014-05-01 ENCOUNTER — Encounter: Payer: Self-pay | Admitting: Nurse Practitioner

## 2014-05-01 ENCOUNTER — Ambulatory Visit (INDEPENDENT_AMBULATORY_CARE_PROVIDER_SITE_OTHER): Payer: Medicare Other | Admitting: Nurse Practitioner

## 2014-05-01 VITALS — BP 138/78 | HR 146 | Ht 69.0 in | Wt 179.6 lb

## 2014-05-01 DIAGNOSIS — E782 Mixed hyperlipidemia: Secondary | ICD-10-CM | POA: Diagnosis not present

## 2014-05-01 DIAGNOSIS — I10 Essential (primary) hypertension: Secondary | ICD-10-CM | POA: Diagnosis not present

## 2014-05-01 DIAGNOSIS — I2510 Atherosclerotic heart disease of native coronary artery without angina pectoris: Secondary | ICD-10-CM

## 2014-05-01 DIAGNOSIS — I4891 Unspecified atrial fibrillation: Secondary | ICD-10-CM

## 2014-05-01 LAB — MAGNESIUM: Magnesium: 2.1 mg/dL (ref 1.5–2.5)

## 2014-05-01 LAB — TSH: TSH: 0.83 u[IU]/mL (ref 0.35–4.50)

## 2014-05-01 MED ORDER — METOPROLOL SUCCINATE ER 50 MG PO TB24: 50.0000 mg | ORAL_TABLET | Freq: Two times a day (BID) | ORAL | Status: DC

## 2014-05-01 MED ORDER — APIXABAN 5 MG PO TABS: 5.0000 mg | ORAL_TABLET | Freq: Two times a day (BID) | ORAL | Status: DC

## 2014-05-01 NOTE — Progress Notes (Signed)
Patient Name: Robert [name] Date of Encounter: 05/01/2014  Primary Care Provider:  Henrine Screws, MD Primary Cardiologist:  Lendell Caprice, MD   Chief Complaint  81 y/o male with a h/o AS s/p AVR, CAD, and PAF who presents to clinic today 2/2 a 4 day history of elevated HR's.  Past Medical History   Past Medical History  Diagnosis Date  . Hypertension   . Prostate cancer   . Hyperlipidemia   . Carpal tunnel syndrome     bilateral, carpal tunnel release x 2  . PAF (paroxysmal atrial fibrillation)     a. Post-op AVR;  b. 04/2014 recurrent.  . Polymyalgia rheumatica   . GERD (gastroesophageal reflux disease)     EGD, Dr. Toney Rakes 2008  . Esophagitis     a. 05/2009 a/w GIB.  [name] Esophageal ulcer     a. 05/2009 with hemorrhage - injected with epinephrine-Dr. Herbie [place] Buccini.  . Vitamin D deficiency   . Blood loss anemia     a. 05/2009 a/w GIB.  [name] Hx of colonoscopy 2008    Dr. Toney Rakes, polyps- 5 yr f/u recommended  . Perforated ear drum     right ear drum, tube in left ear drum, Dr. Constance Holster  . GI bleed     a. 05/2009 as above: with associated anemia. Lower esophageal ulcer with extensive erosive esophagitis and large clot by EGD 05/2009.   [name] CAD (coronary artery disease)     a. Cath 04/2009: BMS to LCx 04/2009; CTO of RCA with L-R collaterals, 40% ostial diag.  . Aortic stenosis     a. s/p tissue AVR 05/2013;  b. Echo (06/2013):  Mod LVH, EF 60-65%, no RWMA, Gr 2 DD, AVR ok (mean 13 mmHg), MAC, mild MR, mod LAE, mild RAE, PASP 46 mmHg (mild pulmo HTN)   Past Surgical History  Procedure Laterality Date  . Cardiac catheterization  2011  . Prostatectomy    . Coronary artery bypass graft N/A 05/12/2013    Procedure: CORONARY ARTERY BYPASS GRAFTING (CABG);  Surgeon: Ivin Poot, MD;  Location: [place];  Service: Open Heart Surgery;  Laterality: N/A;  CABG x 1 using left leg greater saphenous vein harvested endoscopically  . Aortic valve replacement N/A 05/12/2013    Procedure: AORTIC VALVE REPLACEMENT (AVR);  Surgeon: Ivin Poot, MD;  Location: [place];  Service: Open Heart Surgery;  Laterality: N/A;  . Intraoperative transesophageal echocardiogram N/A 05/12/2013    Procedure: INTRAOPERATIVE TRANSESOPHAGEAL ECHOCARDIOGRAM;  Surgeon: Ivin Poot, MD;  Location: [place];  Service: Open Heart Surgery;  Laterality: N/A;  . Maze N/A 05/12/2013    Procedure: MAZE;  Surgeon: Ivin Poot, MD;  Location: [place];  Service: Open Heart Surgery;  Laterality: N/A;  . Left and right heart catheterization with coronary angiogram N/A 05/09/2013    Procedure: LEFT AND RIGHT HEART CATHETERIZATION WITH CORONARY ANGIOGRAM;  Surgeon: Lorretta Harp, MD;  Location: [facility] CATH LAB;  Service: Cardiovascular;  Laterality: N/A;    Allergies  Allergies  Allergen Reactions  . Lasix [Furosemide]   . Lisinopril Cough  . Kenalog [Triamcinolone Acetonide] Rash    HPI  81 y/o male with the above problem list.  Beginning on Friday evening, he noted elevated HR's on his home bp cuff - rising into the 130's and 140's.  He noted some mild DOE but overall had not been bothered by his tachycardia.  He called the on call staff over the weekend and was advised  to come into the ED, which he did yesterday.  He was found to be in rapid afib/flutter.  Rate was easily controlled with IV dilt.  Discussion was held with Dr. Wynonia Lawman, who was on call for cardiology, and recommendation was made to add toprol xl 50 bid along with eliquis 5 bid and d/c pt from ED with early cardiology f/u.  Pt showed up in the office today hoping to be seen by Dr. Irish Lack, however he is not in the office today and thus he was added onto my schedule.  He remains in afib with elevated rates however he is asymptomatic.  He denies chest pain, dyspnea, pnd, orthopnea, n, v, dizziness, syncope, edema, weight gain, or early satiety.   Home Medications  Prior to Admission medications   Medication Sig Start Date End Date  Taking? Authorizing Provider  amLODipine (NORVASC) 5 MG tablet Take 1 tablet (5 mg total) by mouth daily. 09/12/13  Yes Jettie Booze, MD  aspirin EC 81 MG EC tablet Take 1 tablet (81 mg total) by mouth daily. 05/18/13  Yes Coolidge Breeze, PA-C  cholecalciferol (VITAMIN D) 1000 UNITS tablet Take 1,000 Units by mouth daily.   Yes Historical Provider, MD  Omega-3 Fatty Acids (FISH OIL) 1000 MG CAPS Take 2 capsules by mouth daily.   Yes Historical Provider, MD  omeprazole (PRILOSEC) 20 MG capsule Take 20 mg by mouth daily.   Yes Historical Provider, MD  simvastatin (ZOCOR) 40 MG tablet Take 40 mg by mouth daily.   Yes Historical Provider, MD  TURMERIC PO Take 1 tablet by mouth daily.   Yes Historical Provider, MD  apixaban (ELIQUIS) 5 MG TABS tablet Take 1 tablet (5 mg total) by mouth 2 (two) times daily. 05/01/14   Rogelia Mire, NP  metoprolol succinate (TOPROL XL) 50 MG 24 hr tablet Take 1 tablet (50 mg total) by mouth 2 (two) times daily. Take with or immediately following a meal. 05/01/14   Rogelia Mire, NP    Review of Systems  He has noted some palpitations and mild DOE.  He denies chest pain, palpitations, dyspnea, pnd, orthopnea, n, v, dizziness, syncope, edema, weight gain, or early satiety.  All other systems reviewed and are otherwise negative except as noted above.  Physical Exam  VS:  BP 138/78 mmHg  Pulse 146  Ht 5\' 9"  (1.753 m)  Wt 179 lb 9.6 oz (81.466 kg)  BMI 26.51 kg/m2  SpO2 99% , BMI Body mass index is 26.51 kg/(m^2). GEN: Well nourished, well developed, in no acute distress. HEENT: normal. Neck: Supple, no JVD, carotid bruits, or masses. Cardiac: IR, IR, tachy, no murmurs, rubs, or gallops. No clubbing, cyanosis, edema.  Radials/DP/PT 2+ and equal bilaterally.  Respiratory:  Respirations regular and unlabored, clear to auscultation bilaterally. GI: Soft, nontender, nondistended, BS + x 4. MS: no deformity or atrophy. Skin: warm and dry, no  rash. Neuro:  Strength and sensation are intact. Psych: Normal affect.  Accessory Clinical Findings  ECG - afib rvr, 146, nonspec t changes.  Assessment & Plan  1.  Afib RVR: Pt presents after noting elevated HR's beginning on Friday and persisting throughout the weekend.  He was seen in the ED yesterday with a recommendation to begin toprol and eliquis however he has yet to fill those Rx because he wanted to be seen by Dr. Irish Lack first.  Despite being tachycardic in clinic, he is completely asymptomatic @ rest.  We discussed options for mgmt including admission  for TEE/DCCV and also outpt rate control and anticoagulation with subsequent DCCV, provided that we can achieve adequate rate control and he remains asymptomatic.  He would prefer outpt mgmt.  As such, I have advised that he fill his Rx for toprol xl 50 mg BID and he is agreeable.  He is concerned that he will not be able to afford eliquis.  We have provided him with 5 weeks worth of eliquis 5 mg for starters, to be taken BID.  He will continue to monitor his HR and symptoms @ home and will contact us if HR's remain above 110 bpm.  Provided that he responds well to toprol, I will have him f/u in 3 wks @ which time we can arrange for outpt dccv.  He is agreeable to this approach.  Given prior h/o GIB, he would prefer to avoid long term anticoagulation but understands that he would need to be on [facility] for at least 4 wks following dccv.  2.  HTN: stable.  He will follow with addition of metoprolol.  We may need to back off of amlodipine if pressures soft.  3.  CAD:  No chest pain despite elevated HR's.  D/C asa in setting of new eliquis.  Cont statin.  BB added today.  4.  HL:  Cont statin therapy.  5.  Disposition:  Check tsh/mg today (not checked in ER yesterday).  F/U in office in 3 wks or sooner if necessary.  Murray Hodgkins, NP 05/01/2014, 2:23 PM

## 2014-05-01 NOTE — Patient Instructions (Addendum)
Your physician has recommended you make the following change in your medication:  1) START Eliquis 5mg  twice daily.  An Rx for Metoprolol has been sent to you pharmacy  Lab Today: Tsh, Magnesium   Your physician recommends that you schedule a follow-up appointment in: 3 weeks with Dr.Varanasi

## 2014-05-17 ENCOUNTER — Other Ambulatory Visit: Payer: Self-pay | Admitting: *Deleted

## 2014-05-17 ENCOUNTER — Ambulatory Visit (INDEPENDENT_AMBULATORY_CARE_PROVIDER_SITE_OTHER): Payer: Medicare Other | Admitting: Interventional Cardiology

## 2014-05-17 ENCOUNTER — Encounter: Payer: Self-pay | Admitting: Interventional Cardiology

## 2014-05-17 VITALS — BP 124/60 | HR 66 | Ht 69.0 in | Wt 175.0 lb

## 2014-05-17 DIAGNOSIS — I4892 Unspecified atrial flutter: Secondary | ICD-10-CM

## 2014-05-17 DIAGNOSIS — Z7901 Long term (current) use of anticoagulants: Secondary | ICD-10-CM | POA: Insufficient documentation

## 2014-05-17 DIAGNOSIS — I4891 Unspecified atrial fibrillation: Secondary | ICD-10-CM | POA: Diagnosis not present

## 2014-05-17 DIAGNOSIS — Z952 Presence of prosthetic heart valve: Secondary | ICD-10-CM

## 2014-05-17 DIAGNOSIS — Z954 Presence of other heart-valve replacement: Secondary | ICD-10-CM

## 2014-05-17 DIAGNOSIS — I2510 Atherosclerotic heart disease of native coronary artery without angina pectoris: Secondary | ICD-10-CM

## 2014-05-17 DIAGNOSIS — I480 Paroxysmal atrial fibrillation: Secondary | ICD-10-CM

## 2014-05-17 MED ORDER — METOPROLOL SUCCINATE ER 50 MG PO TB24: 50.0000 mg | ORAL_TABLET | Freq: Two times a day (BID) | ORAL | Status: DC

## 2014-05-17 MED ORDER — WARFARIN SODIUM 5 MG PO TABS: 5.0000 mg | ORAL_TABLET | Freq: Every day | ORAL | Status: DC

## 2014-05-17 NOTE — Progress Notes (Signed)
Patient ID: Robert [name], male   DOB: September 05, 1927, 81 y.o.   MRN: [id number]     Cardiology Office Note   Date:  05/17/2014   ID:  Robert [name], DOB [birth year], MRN [id number]  PCP:  Henrine Screws, MD  Cardiologist:   Jettie Booze., MD   No chief complaint on file. Atrial flutter    History of Present Illness: Robert [name] is a 81 y.o. male who had a BMS to the circumflex in 2011. He had AFib in 2/15. Workup revealed severe AS. He had AVR with single vessel CABG, Maze procedure and left atrial clip in 2015. He had presyncope and his HR was in the 40s. Resolved with stopping beta blocker and digoxin. He did well for a while   In 2/16, he had palpitations and went to the ER. He was in atrial flutter 2:1 block.  He was sent home.  He was started on metoprolol and Eliquis in the clinic the next day.  Rhythm converted.  No CP, SHOB, swelling since then.  He has not had any symptoms of lightheadedness like he had several months ago before stopping the AV nodal blocking agents. He seems to be tolerating the dose of metoprolol extended release quite well. He is concerned that his Eliquis will be too expensive. When he has had a pharmacy coverage for this, he ends up in the doughnut hole. He is willing to go back on Coumadin for anticoagulation and stroke prevention..    Past Medical History  Diagnosis Date  . Hypertension   . Prostate cancer   . Hyperlipidemia   . Carpal tunnel syndrome     bilateral, carpal tunnel release x 2  . PAF (paroxysmal atrial fibrillation)     a. Post-op AVR;  b. 04/2014 recurrent.  . Polymyalgia rheumatica   . GERD (gastroesophageal reflux disease)     EGD, Dr. Toney Rakes 2008  . Esophagitis     a. 05/2009 a/w GIB.  [name] Esophageal ulcer     a. 05/2009 with hemorrhage - injected with epinephrine-Dr. Herbie [place] Buccini.  . Vitamin D deficiency   . Blood loss anemia     a. 05/2009 a/w GIB.  [name] Hx of colonoscopy 2008    Dr.  Toney Rakes, polyps- 5 yr f/u recommended  . Perforated ear drum     right ear drum, tube in left ear drum, Dr. Constance Holster  . GI bleed     a. 05/2009 as above: with associated anemia. Lower esophageal ulcer with extensive erosive esophagitis and large clot by EGD 05/2009.   [name] CAD (coronary artery disease)     a. Cath 04/2009: BMS to LCx 04/2009; CTO of RCA with L-R collaterals, 40% ostial diag.  . Aortic stenosis     a. s/p tissue AVR 05/2013;  b. Echo (06/2013):  Mod LVH, EF 60-65%, no RWMA, Gr 2 DD, AVR ok (mean 13 mmHg), MAC, mild MR, mod LAE, mild RAE, PASP 46 mmHg (mild pulmo HTN)    Past Surgical History  Procedure Laterality Date  . Cardiac catheterization  2011  . Prostatectomy    . Coronary artery bypass graft N/A 05/12/2013    Procedure: CORONARY ARTERY BYPASS GRAFTING (CABG);  Surgeon: Ivin Poot, MD;  Location: [place];  Service: Open Heart Surgery;  Laterality: N/A;  CABG x 1 using left leg greater saphenous vein harvested endoscopically  . Aortic valve replacement N/A 05/12/2013    Procedure: AORTIC VALVE REPLACEMENT (AVR);  Surgeon: Ivin Poot, MD;  Location: [facility];  Service: Open Heart Surgery;  Laterality: N/A;  . Intraoperative transesophageal echocardiogram N/A 05/12/2013    Procedure: INTRAOPERATIVE TRANSESOPHAGEAL ECHOCARDIOGRAM;  Surgeon: Ivin Poot, MD;  Location: [place];  Service: Open Heart Surgery;  Laterality: N/A;  . Maze N/A 05/12/2013    Procedure: MAZE;  Surgeon: Ivin Poot, MD;  Location: [place];  Service: Open Heart Surgery;  Laterality: N/A;  . Left and right heart catheterization with coronary angiogram N/A 05/09/2013    Procedure: LEFT AND RIGHT HEART CATHETERIZATION WITH CORONARY ANGIOGRAM;  Surgeon: Lorretta Harp, MD;  Location[facility] CATH LAB;  Service: Cardiovascular;  Laterality: N/A;     Current Outpatient Prescriptions  Medication Sig Dispense Refill  . amLODipine (NORVASC) 5 MG tablet Take 1 tablet (5 mg total) by mouth daily. 90 tablet 3    . aspirin EC 81 MG EC tablet Take 1 tablet (81 mg total) by mouth daily.    . cholecalciferol (VITAMIN D) 1000 UNITS tablet Take 1,000 Units by mouth daily.    . metoprolol succinate (TOPROL XL) 50 MG 24 hr tablet Take 1 tablet (50 mg total) by mouth 2 (two) times daily. Take with or immediately following a meal. 60 tablet 5  . Omega-3 Fatty Acids (FISH OIL) 1000 MG CAPS Take 2 capsules by mouth daily.    [name] omeprazole (PRILOSEC) 20 MG capsule Take 20 mg by mouth daily.    . simvastatin (ZOCOR) 40 MG tablet Take 40 mg by mouth daily.    . TURMERIC PO Take 1 tablet by mouth daily.    [name] warfarin (COUMADIN) 5 MG tablet Take 1 tablet (5 mg total) by mouth daily at 6 PM. 10 tablet 1   No current facility-administered medications for this visit.    Allergies:   Lasix; Lisinopril; and Kenalog    Social History:  The patient  reports that he has never smoked. He does not have any smokeless tobacco history on file. He reports that he does not drink alcohol or use illicit drugs.   Family History:  The patient's family history includes Breast cancer in his daughter; CVA in his mother; Kidney disease in his father.    ROS:  Please see the history of present illness.   Otherwise, review of systems are positive for palpitatipons as above. None recently.  All other systems are reviewed and negative.    PHYSICAL EXAM: VS:  BP 124/60 mmHg  Pulse 66  Ht 5\' 9"  (1.753 m)  Wt 175 lb (79.379 kg)  BMI 25.83 kg/m2  SpO2 96% , BMI Body mass index is 25.83 kg/(m^2). GEN: Well nourished, well developed, in no acute distress HEENT: normal Neck: no JVD, carotid bruits, or masses Cardiac: RRR; sot systolic murmurs, rubs, or gallops,no edema  Respiratory:  clear to auscultation bilaterally, normal work of breathing GI: soft, nontender, nondistended, + BS MS: no deformity or atrophy Skin: warm and dry, no rash Neuro:  Strength and sensation are intact Psych: euthymic mood, full affect   EKG:  EKG is not  ordered today.    Recent Labs: 04/30/2014: ALT 16; BUN 14; Creatinine 0.96; Hemoglobin 12.7*; Platelets 167; Potassium 3.7; Sodium 137 05/01/2014: Magnesium 2.1; TSH 0.83    Lipid Panel No results found for: CHOL, TRIG, HDL, CHOLHDL, VLDL, LDLCALC, LDLDIRECT    Wt Readings from Last 3 Encounters:  05/17/14 175 lb (79.379 kg)  05/01/14 179 lb 9.6 oz (81.466 kg)  04/30/14 180 lb (81.647 kg)  Other studies Reviewed: Additional studies/ records that were reviewed today include: Prior note and hospital ECG revealing atrial flutter.. Review of the above records demonstrates: Atrial flutter   ASSESSMENT AND PLAN:  1.  Atrial flutter/atrial fibrillation: History of atrial fibrillation.  ECG from the emergency room showed atrial flutter with 21 block. Now back in normal sinus rhythm. Continue metoprolol ER 50 mg twice a day.  He will finish out his Eliquis for stroke prevention. He has about 7 days left. We will then transition him to Coumadin. He will meet with the pharmacist today about getting on a schedule for anticoagulation. Stop aspirin as well.  He was hoping to avoid anticoagulation. I think since he has had asymptomatic atrial fibrillation in the past and his chads score is at least 3, we should plan on giving him anticoagulation for stroke prevention.  He will let us know if he has any symptoms of bradycardia.  2.  Status post aortic valve replacement: Continue SBE prophylaxis with dental treatments.   Current medicines are reviewed at length with the patient today.  The patient has concerns regarding medicines.  The following changes have been made:  Stop Eliquis, start Coumadin  Labs/ tests ordered today include: Pharmacy consult for Coumadin  No orders of the defined types were placed in this encounter.     Disposition:   FU as scheduled in 1/17.   Teresita Madura., MD  05/17/2014 2:17 PM    [facility] [place],  [place], [place]  [address] Phone: [phone]; Fax: [phone]

## 2014-05-17 NOTE — Patient Instructions (Addendum)
Start taking Coumadin 5mg  once daily when you have 3 day of Eliquis left.  We will check your INR (Coumadin levels) in about 10 days.   You need to come in Friday, May 26, 2014 for an appointment with our Coumadin Clinic.   A refill has been sent in for your metoprolol.  Your physician recommends that you schedule a follow-up appointment as ordered for January 2017.

## 2014-05-18 ENCOUNTER — Telehealth: Payer: Self-pay | Admitting: Interventional Cardiology

## 2014-05-18 NOTE — Telephone Encounter (Signed)
New Msg       Pt c/o medication issue:  1. Name of Medication: Coumadin  2. How are you currently taking this medication (dosage and times per day)? Once a day   3. Are you having a reaction (difficulty breathing--STAT)? No  4. What is your medication issue? Two weeks left of Eliquis, should he finish this before he begins coumadin?   Please return call to pt wife.

## 2014-05-18 NOTE — Telephone Encounter (Signed)
Patient wanted to change is Coumadin appointment because he has enough eliquis to last him one more week. Appointment was changed to June 02, 2014 at 8 am. Patient agreed with changed and verbalized understanding.

## 2014-05-18 NOTE — Telephone Encounter (Signed)
Contacted the pt to inform him that per our Pharmacist, there are no contraindications with having acupuncture done and taking anticoagulants like coumadin or ASA.  Pt verbalized understanding and gracious for all the assistance provided.

## 2014-05-18 NOTE — Telephone Encounter (Signed)
New Msg       Pt is having acupuncture tomorrow, is this okay?  Pt is on blood thinners.   Please return call.

## 2014-05-25 ENCOUNTER — Other Ambulatory Visit: Payer: Self-pay | Admitting: Dermatology

## 2014-05-25 DIAGNOSIS — C44629 Squamous cell carcinoma of skin of left upper limb, including shoulder: Secondary | ICD-10-CM | POA: Diagnosis not present

## 2014-05-25 DIAGNOSIS — D0461 Carcinoma in situ of skin of right upper limb, including shoulder: Secondary | ICD-10-CM | POA: Diagnosis not present

## 2014-05-25 DIAGNOSIS — Z85828 Personal history of other malignant neoplasm of skin: Secondary | ICD-10-CM | POA: Diagnosis not present

## 2014-05-25 DIAGNOSIS — D485 Neoplasm of uncertain behavior of skin: Secondary | ICD-10-CM | POA: Diagnosis not present

## 2014-05-25 DIAGNOSIS — L570 Actinic keratosis: Secondary | ICD-10-CM | POA: Diagnosis not present

## 2014-06-02 ENCOUNTER — Ambulatory Visit (INDEPENDENT_AMBULATORY_CARE_PROVIDER_SITE_OTHER): Payer: Medicare Other | Admitting: *Deleted

## 2014-06-02 DIAGNOSIS — Z5181 Encounter for therapeutic drug level monitoring: Secondary | ICD-10-CM | POA: Diagnosis not present

## 2014-06-02 DIAGNOSIS — I4891 Unspecified atrial fibrillation: Secondary | ICD-10-CM

## 2014-06-02 LAB — POCT INR: INR: 1.5

## 2014-06-02 MED ORDER — WARFARIN SODIUM 5 MG PO TABS: 5.0000 mg | ORAL_TABLET | Freq: Every day | ORAL | Status: DC

## 2014-06-02 NOTE — Patient Instructions (Signed)

## 2014-06-08 ENCOUNTER — Ambulatory Visit (INDEPENDENT_AMBULATORY_CARE_PROVIDER_SITE_OTHER): Payer: Medicare Other | Admitting: *Deleted

## 2014-06-08 DIAGNOSIS — Z5181 Encounter for therapeutic drug level monitoring: Secondary | ICD-10-CM | POA: Diagnosis not present

## 2014-06-08 DIAGNOSIS — I4891 Unspecified atrial fibrillation: Secondary | ICD-10-CM | POA: Diagnosis not present

## 2014-06-08 LAB — POCT INR: INR: 3.2

## 2014-06-15 ENCOUNTER — Ambulatory Visit (INDEPENDENT_AMBULATORY_CARE_PROVIDER_SITE_OTHER): Payer: Medicare Other

## 2014-06-15 DIAGNOSIS — I4891 Unspecified atrial fibrillation: Secondary | ICD-10-CM | POA: Diagnosis not present

## 2014-06-15 DIAGNOSIS — Z5181 Encounter for therapeutic drug level monitoring: Secondary | ICD-10-CM

## 2014-06-15 LAB — POCT INR: INR: 4

## 2014-06-22 ENCOUNTER — Ambulatory Visit (INDEPENDENT_AMBULATORY_CARE_PROVIDER_SITE_OTHER): Payer: Medicare Other | Admitting: *Deleted

## 2014-06-22 DIAGNOSIS — I4891 Unspecified atrial fibrillation: Secondary | ICD-10-CM

## 2014-06-22 DIAGNOSIS — Z5181 Encounter for therapeutic drug level monitoring: Secondary | ICD-10-CM

## 2014-06-22 LAB — POCT INR: INR: 2.5

## 2014-07-06 ENCOUNTER — Ambulatory Visit (INDEPENDENT_AMBULATORY_CARE_PROVIDER_SITE_OTHER): Payer: Medicare Other

## 2014-07-06 DIAGNOSIS — I4891 Unspecified atrial fibrillation: Secondary | ICD-10-CM

## 2014-07-06 DIAGNOSIS — Z5181 Encounter for therapeutic drug level monitoring: Secondary | ICD-10-CM | POA: Diagnosis not present

## 2014-07-06 LAB — POCT INR: INR: 2.6

## 2014-07-27 ENCOUNTER — Ambulatory Visit (INDEPENDENT_AMBULATORY_CARE_PROVIDER_SITE_OTHER): Payer: Medicare Other | Admitting: *Deleted

## 2014-07-27 DIAGNOSIS — I4891 Unspecified atrial fibrillation: Secondary | ICD-10-CM

## 2014-07-27 DIAGNOSIS — Z5181 Encounter for therapeutic drug level monitoring: Secondary | ICD-10-CM | POA: Diagnosis not present

## 2014-07-27 LAB — POCT INR: INR: 2.8

## 2014-08-24 ENCOUNTER — Ambulatory Visit (INDEPENDENT_AMBULATORY_CARE_PROVIDER_SITE_OTHER): Payer: Medicare Other

## 2014-08-24 DIAGNOSIS — Z5181 Encounter for therapeutic drug level monitoring: Secondary | ICD-10-CM

## 2014-08-24 DIAGNOSIS — I4891 Unspecified atrial fibrillation: Secondary | ICD-10-CM

## 2014-08-24 DIAGNOSIS — I2510 Atherosclerotic heart disease of native coronary artery without angina pectoris: Secondary | ICD-10-CM | POA: Diagnosis not present

## 2014-08-24 DIAGNOSIS — M25579 Pain in unspecified ankle and joints of unspecified foot: Secondary | ICD-10-CM | POA: Diagnosis not present

## 2014-08-24 DIAGNOSIS — K219 Gastro-esophageal reflux disease without esophagitis: Secondary | ICD-10-CM | POA: Diagnosis not present

## 2014-08-24 DIAGNOSIS — Z8546 Personal history of malignant neoplasm of prostate: Secondary | ICD-10-CM | POA: Diagnosis not present

## 2014-08-24 DIAGNOSIS — I509 Heart failure, unspecified: Secondary | ICD-10-CM | POA: Diagnosis not present

## 2014-08-24 DIAGNOSIS — E559 Vitamin D deficiency, unspecified: Secondary | ICD-10-CM | POA: Diagnosis not present

## 2014-08-24 DIAGNOSIS — Z952 Presence of prosthetic heart valve: Secondary | ICD-10-CM | POA: Diagnosis not present

## 2014-08-24 DIAGNOSIS — M79671 Pain in right foot: Secondary | ICD-10-CM | POA: Diagnosis not present

## 2014-08-24 DIAGNOSIS — I10 Essential (primary) hypertension: Secondary | ICD-10-CM | POA: Diagnosis not present

## 2014-08-24 LAB — POCT INR: INR: 2.4

## 2014-08-31 ENCOUNTER — Ambulatory Visit: Payer: Medicare Other

## 2014-08-31 ENCOUNTER — Ambulatory Visit (INDEPENDENT_AMBULATORY_CARE_PROVIDER_SITE_OTHER): Payer: Medicare Other | Admitting: Podiatry

## 2014-08-31 ENCOUNTER — Encounter: Payer: Self-pay | Admitting: Podiatry

## 2014-08-31 VITALS — BP 154/82 | HR 76 | Resp 15

## 2014-08-31 DIAGNOSIS — I2510 Atherosclerotic heart disease of native coronary artery without angina pectoris: Secondary | ICD-10-CM

## 2014-08-31 DIAGNOSIS — M79671 Pain in right foot: Secondary | ICD-10-CM

## 2014-08-31 DIAGNOSIS — M779 Enthesopathy, unspecified: Secondary | ICD-10-CM | POA: Diagnosis not present

## 2014-08-31 DIAGNOSIS — M79672 Pain in left foot: Secondary | ICD-10-CM

## 2014-08-31 MED ORDER — TRIAMCINOLONE ACETONIDE 10 MG/ML IJ SUSP
10.0000 mg | Freq: Once | INTRAMUSCULAR | Status: AC
  Administered 2014-08-31: 10 mg

## 2014-08-31 MED ORDER — TRIAMCINOLONE ACETONIDE 10 MG/ML IJ SUSP
10.0000 mg | Freq: Once | INTRAMUSCULAR | Status: AC
Start: 2014-08-31 — End: 2014-08-31
  Administered 2014-08-31: 10 mg

## 2014-08-31 NOTE — Progress Notes (Signed)
   Subjective:    Patient ID: Robert [name], male    DOB: 08-Mar-1927, 81 y.o.   MRN: [id number]  HPI P tpresents with bilateral ankle pain lateral and medial side, this has been ongoing for several years and worsening. He states that the pain is worse in the mornings when getting up and ambulating. PR pain medication does not help   Review of Systems  All other systems reviewed and are negative.      Objective:   Physical Exam        Assessment & Plan:

## 2014-08-31 NOTE — Progress Notes (Signed)
Subjective:     Patient ID: Robert [name], male   DOB: 1928/02/12, 81 y.o.   MRN: [id number]  HPI patient presents stating that I have a lot of pain in both my feet that's been present for a number of years and is worsened over the last 6 months. Patient states it's both ankles and also feet in general and there is days they really hurt and then there's times where he does not seem to have much pain. 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      Objective:   Physical Exam  Constitutional: He is oriented to person, place, and time.  Cardiovascular: Intact distal pulses.   Musculoskeletal: Normal range of motion.  Neurological: He is oriented to person, place, and time.  Skin: Skin is warm.  Nursing note and vitals reviewed.  neurovascular status was found to be intact with moderate diminishment of the range of motion of the ankle joint bilateral with discomfort in the sinus tarsi right over left and discomfort in the posterior tibial tendon left over right with edema around the posterior tibial tendon left. 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 Discussed about the possibility for long-term orthotics if we can showing improvement and we will reevaluate in 2 weeks

## 2014-09-14 ENCOUNTER — Ambulatory Visit (INDEPENDENT_AMBULATORY_CARE_PROVIDER_SITE_OTHER): Payer: Medicare Other | Admitting: Podiatry

## 2014-09-14 ENCOUNTER — Encounter: Payer: Self-pay | Admitting: Podiatry

## 2014-09-14 VITALS — BP 145/76 | HR 66 | Resp 15

## 2014-09-14 DIAGNOSIS — I2510 Atherosclerotic heart disease of native coronary artery without angina pectoris: Secondary | ICD-10-CM | POA: Diagnosis not present

## 2014-09-14 DIAGNOSIS — M779 Enthesopathy, unspecified: Secondary | ICD-10-CM

## 2014-09-14 NOTE — Progress Notes (Signed)
Subjective:     Patient ID: Robert [name], male   DOB: 1927-06-20, 81 y.o.   MRN: [id number]  HPI patient states my feet are feeling quite a bit better but I still gets some pain in my right and I know I need some kind of inserts therapy   Review of Systems     Objective:   Physical Exam Neurovascular status intact with diminishment of discomfort posterior tibial tendon left sinus tarsi right with continued range of motion loss of the subtalar and ankle joint bilateral with no crepitus    Assessment:     Doing well with continued inflammation that is moderate    Plan:     Went ahead today and scanned for custom orthotics to reduce stress against his feet and reappoint for [place] to recheck

## 2014-10-06 ENCOUNTER — Other Ambulatory Visit: Payer: Self-pay | Admitting: *Deleted

## 2014-10-06 ENCOUNTER — Ambulatory Visit: Payer: Medicare Other | Admitting: *Deleted

## 2014-10-06 ENCOUNTER — Ambulatory Visit (INDEPENDENT_AMBULATORY_CARE_PROVIDER_SITE_OTHER): Payer: Medicare Other | Admitting: *Deleted

## 2014-10-06 DIAGNOSIS — M779 Enthesopathy, unspecified: Secondary | ICD-10-CM

## 2014-10-06 DIAGNOSIS — I4891 Unspecified atrial fibrillation: Secondary | ICD-10-CM | POA: Diagnosis not present

## 2014-10-06 DIAGNOSIS — Z5181 Encounter for therapeutic drug level monitoring: Secondary | ICD-10-CM

## 2014-10-06 LAB — POCT INR: INR: 2.7

## 2014-10-06 MED ORDER — AMLODIPINE BESYLATE 5 MG PO TABS: 5.0000 mg | ORAL_TABLET | Freq: Every day | ORAL | Status: DC

## 2014-10-06 MED ORDER — WARFARIN SODIUM 5 MG PO TABS: 5.0000 mg | ORAL_TABLET | Freq: Every day | ORAL | Status: DC

## 2014-10-06 NOTE — Progress Notes (Signed)
Patient ID: ANTWION CARPENTER, male   DOB: Sep 17, 1927, 80 y.o.   MRN: [id number] Patient presents for orthotic pick up.  Verbal and written break in and wear instructions given.  Patient will follow up in 4 weeks if symptoms worsen or fail to improve.

## 2014-10-06 NOTE — Patient Instructions (Signed)

## 2014-11-17 ENCOUNTER — Ambulatory Visit (INDEPENDENT_AMBULATORY_CARE_PROVIDER_SITE_OTHER): Payer: Medicare Other

## 2014-11-17 DIAGNOSIS — I4891 Unspecified atrial fibrillation: Secondary | ICD-10-CM

## 2014-11-17 DIAGNOSIS — Z5181 Encounter for therapeutic drug level monitoring: Secondary | ICD-10-CM | POA: Diagnosis not present

## 2014-11-17 LAB — POCT INR: INR: 3.3

## 2014-11-22 DIAGNOSIS — H6122 Impacted cerumen, left ear: Secondary | ICD-10-CM | POA: Diagnosis not present

## 2014-11-22 DIAGNOSIS — H9193 Unspecified hearing loss, bilateral: Secondary | ICD-10-CM | POA: Diagnosis not present

## 2014-11-22 DIAGNOSIS — H9211 Otorrhea, right ear: Secondary | ICD-10-CM | POA: Diagnosis not present

## 2014-11-22 DIAGNOSIS — H7291 Unspecified perforation of tympanic membrane, right ear: Secondary | ICD-10-CM | POA: Diagnosis not present

## 2014-12-04 ENCOUNTER — Other Ambulatory Visit: Payer: Self-pay

## 2014-12-04 MED ORDER — AMLODIPINE BESYLATE 5 MG PO TABS: 5.0000 mg | ORAL_TABLET | Freq: Every day | ORAL | Status: DC

## 2014-12-05 DIAGNOSIS — H7291 Unspecified perforation of tympanic membrane, right ear: Secondary | ICD-10-CM | POA: Diagnosis not present

## 2014-12-05 DIAGNOSIS — Z9622 Myringotomy tube(s) status: Secondary | ICD-10-CM | POA: Diagnosis not present

## 2014-12-14 ENCOUNTER — Ambulatory Visit (INDEPENDENT_AMBULATORY_CARE_PROVIDER_SITE_OTHER): Payer: Medicare Other | Admitting: *Deleted

## 2014-12-14 DIAGNOSIS — Z5181 Encounter for therapeutic drug level monitoring: Secondary | ICD-10-CM | POA: Diagnosis not present

## 2014-12-14 DIAGNOSIS — I4891 Unspecified atrial fibrillation: Secondary | ICD-10-CM

## 2014-12-14 LAB — POCT INR: INR: 2.8

## 2014-12-15 DIAGNOSIS — J029 Acute pharyngitis, unspecified: Secondary | ICD-10-CM | POA: Diagnosis not present

## 2014-12-15 DIAGNOSIS — J0100 Acute maxillary sinusitis, unspecified: Secondary | ICD-10-CM | POA: Diagnosis not present

## 2014-12-20 DIAGNOSIS — L0390 Cellulitis, unspecified: Secondary | ICD-10-CM | POA: Diagnosis not present

## 2014-12-21 ENCOUNTER — Telehealth: Payer: Self-pay | Admitting: Interventional Cardiology

## 2014-12-21 NOTE — Telephone Encounter (Signed)
New Message       Pt calling stating he spoke to Dr. Hassell Done nurse a while ago about getting him an appt w/ Dr. Irish Lack before the end of the year and was told she would call him back. Pt states he hasn't been called back and he needs an appt. I notified pt that Dr. Irish Lack doesn't have any available appt's prior to January and pt states he only wants to see him and only wants to talk to the nurse because she said she would get him in. Please call back and advise.

## 2014-12-21 NOTE — Telephone Encounter (Signed)
Appointment scheduled for 1/12 @ 8 am for the pt to see Dr Irish Lack. The pt agrees with date and time of f/u.

## 2014-12-27 DIAGNOSIS — M65352 Trigger finger, left little finger: Secondary | ICD-10-CM | POA: Diagnosis not present

## 2014-12-27 DIAGNOSIS — M65342 Trigger finger, left ring finger: Secondary | ICD-10-CM | POA: Diagnosis not present

## 2015-01-11 ENCOUNTER — Ambulatory Visit (INDEPENDENT_AMBULATORY_CARE_PROVIDER_SITE_OTHER): Payer: Medicare Other | Admitting: *Deleted

## 2015-01-11 DIAGNOSIS — I4891 Unspecified atrial fibrillation: Secondary | ICD-10-CM

## 2015-01-11 DIAGNOSIS — Z5181 Encounter for therapeutic drug level monitoring: Secondary | ICD-10-CM | POA: Diagnosis not present

## 2015-01-11 LAB — POCT INR: INR: 2.2

## 2015-01-22 DIAGNOSIS — Z23 Encounter for immunization: Secondary | ICD-10-CM | POA: Diagnosis not present

## 2015-01-30 DIAGNOSIS — M65342 Trigger finger, left ring finger: Secondary | ICD-10-CM | POA: Diagnosis not present

## 2015-01-30 DIAGNOSIS — M65352 Trigger finger, left little finger: Secondary | ICD-10-CM | POA: Diagnosis not present

## 2015-02-08 ENCOUNTER — Ambulatory Visit (INDEPENDENT_AMBULATORY_CARE_PROVIDER_SITE_OTHER): Payer: Medicare Other | Admitting: *Deleted

## 2015-02-08 DIAGNOSIS — I4891 Unspecified atrial fibrillation: Secondary | ICD-10-CM | POA: Diagnosis not present

## 2015-02-08 DIAGNOSIS — Z5181 Encounter for therapeutic drug level monitoring: Secondary | ICD-10-CM | POA: Diagnosis not present

## 2015-02-08 LAB — POCT INR: INR: 3.2

## 2015-02-23 DIAGNOSIS — E559 Vitamin D deficiency, unspecified: Secondary | ICD-10-CM | POA: Diagnosis not present

## 2015-02-23 DIAGNOSIS — Z0001 Encounter for general adult medical examination with abnormal findings: Secondary | ICD-10-CM | POA: Diagnosis not present

## 2015-02-23 DIAGNOSIS — Z952 Presence of prosthetic heart valve: Secondary | ICD-10-CM | POA: Diagnosis not present

## 2015-02-23 DIAGNOSIS — Z79899 Other long term (current) drug therapy: Secondary | ICD-10-CM | POA: Diagnosis not present

## 2015-02-23 DIAGNOSIS — Z8546 Personal history of malignant neoplasm of prostate: Secondary | ICD-10-CM | POA: Diagnosis not present

## 2015-02-23 DIAGNOSIS — I10 Essential (primary) hypertension: Secondary | ICD-10-CM | POA: Diagnosis not present

## 2015-02-23 DIAGNOSIS — Z1389 Encounter for screening for other disorder: Secondary | ICD-10-CM | POA: Diagnosis not present

## 2015-02-23 DIAGNOSIS — M79671 Pain in right foot: Secondary | ICD-10-CM | POA: Diagnosis not present

## 2015-02-23 DIAGNOSIS — I4891 Unspecified atrial fibrillation: Secondary | ICD-10-CM | POA: Diagnosis not present

## 2015-02-23 DIAGNOSIS — K219 Gastro-esophageal reflux disease without esophagitis: Secondary | ICD-10-CM | POA: Diagnosis not present

## 2015-02-23 DIAGNOSIS — I2510 Atherosclerotic heart disease of native coronary artery without angina pectoris: Secondary | ICD-10-CM | POA: Diagnosis not present

## 2015-02-23 DIAGNOSIS — I509 Heart failure, unspecified: Secondary | ICD-10-CM | POA: Diagnosis not present

## 2015-03-15 ENCOUNTER — Ambulatory Visit (INDEPENDENT_AMBULATORY_CARE_PROVIDER_SITE_OTHER): Payer: Medicare Other

## 2015-03-15 ENCOUNTER — Ambulatory Visit (INDEPENDENT_AMBULATORY_CARE_PROVIDER_SITE_OTHER): Payer: Medicare Other | Admitting: Interventional Cardiology

## 2015-03-15 ENCOUNTER — Encounter: Payer: Self-pay | Admitting: Interventional Cardiology

## 2015-03-15 VITALS — BP 136/72 | HR 74 | Ht 69.0 in | Wt 180.0 lb

## 2015-03-15 DIAGNOSIS — I10 Essential (primary) hypertension: Secondary | ICD-10-CM | POA: Diagnosis not present

## 2015-03-15 DIAGNOSIS — I2510 Atherosclerotic heart disease of native coronary artery without angina pectoris: Secondary | ICD-10-CM | POA: Diagnosis not present

## 2015-03-15 DIAGNOSIS — I480 Paroxysmal atrial fibrillation: Secondary | ICD-10-CM | POA: Diagnosis not present

## 2015-03-15 DIAGNOSIS — Z5181 Encounter for therapeutic drug level monitoring: Secondary | ICD-10-CM

## 2015-03-15 DIAGNOSIS — I4891 Unspecified atrial fibrillation: Secondary | ICD-10-CM

## 2015-03-15 DIAGNOSIS — Z954 Presence of other heart-valve replacement: Secondary | ICD-10-CM

## 2015-03-15 DIAGNOSIS — Z952 Presence of prosthetic heart valve: Secondary | ICD-10-CM

## 2015-03-15 LAB — POCT INR: INR: 2.6

## 2015-03-15 MED ORDER — WARFARIN SODIUM 5 MG PO TABS: 5.0000 mg | ORAL_TABLET | Freq: Every day | ORAL | Status: DC

## 2015-03-15 NOTE — Patient Instructions (Signed)
Medication Instructions:  Same-no changes  Labwork: None  Testing/Procedures: None  Follow-Up: Your physician wants you to follow-up in: 6 months. You will receive a reminder letter in the mail two months in advance. If you don't receive a letter, please call our office to schedule the follow-up appointment.      If you need a refill on your cardiac medications before your next appointment, please call your pharmacy.   

## 2015-03-15 NOTE — Progress Notes (Signed)
Patient ID: Robert [name], male   DOB: 1927/07/09, 82 y.o.   MRN: [id number]     Cardiology Office Note   Date:  03/15/2015   ID:  Robert [name], DOB [birth year], MRN [id number]  PCP:  Henrine Screws, MD    No chief complaint on file. f/u CAD   Wt Readings from Last 3 Encounters:  03/15/15 180 lb (81.647 kg)  05/17/14 175 lb (79.379 kg)  05/01/14 179 lb 9.6 oz (81.466 kg)       History of Present Illness: Robert [name] is a 82 y.o. male  who had a BMS to the circumflex in 2011. He had AFib in 2/15. Workup revealed severe AS. He had AVR with single vessel CABG, Maze procedure and left atrial clip in 2015. He had presyncope and his HR was in the 40s. Resolved with stopping beta blocker and digoxin. He did well for a while.   In 2/16, he had palpitations and went to the ER. He was in atrial flutter 2:1 block. He was sent home. He was started on metoprolol and Eliquis in the clinic the next day. Rhythm converted.    No CP, SHOB, swelling recently.  Eliquis was changed to COumadin due to cost reasons.  He is tolerating this well. He has not acquired many dose adjustments. He has not felt any further fast heart rates. He has not had lightheadedness that was associated with his bradycardia. He has tolerated metoprolol well.    Past Medical History  Diagnosis Date  . Hypertension   . Prostate cancer ([place])   . Hyperlipidemia   . Carpal tunnel syndrome     bilateral, carpal tunnel release x 2  . PAF (paroxysmal atrial fibrillation) ([place])     a. Post-op AVR;  b. 04/2014 recurrent.  . Polymyalgia rheumatica ([place])   . GERD (gastroesophageal reflux disease)     EGD, Dr. Toney Rakes 2008  . Esophagitis     a. 05/2009 a/w GIB.  [name] Esophageal ulcer     a. 05/2009 with hemorrhage - injected with epinephrine-Dr. Herbie Baltimore Buccini.  . Vitamin D deficiency   . Blood loss anemia     a. 05/2009 a/w GIB.  [name] Hx of colonoscopy 2008    Dr. Toney Rakes,  polyps- 5 yr f/u recommended  . Perforated ear drum     right ear drum, tube in left ear drum, Dr. Constance Holster  . GI bleed     a. 05/2009 as above: with associated anemia. Lower esophageal ulcer with extensive erosive esophagitis and large clot by EGD 05/2009.   [name] CAD (coronary artery disease)     a. Cath 04/2009: BMS to LCx 04/2009; CTO of RCA with L-R collaterals, 40% ostial diag.  . Aortic stenosis     a. s/p tissue AVR 05/2013;  b. Echo (06/2013):  Mod LVH, EF 60-65%, no RWMA, Gr 2 DD, AVR ok (mean 13 mmHg), MAC, mild MR, mod LAE, mild RAE, PASP 46 mmHg (mild pulmo HTN)    Past Surgical History  Procedure Laterality Date  . Cardiac catheterization  2011  . Prostatectomy    . Coronary artery bypass graft N/A 05/12/2013    Procedure: CORONARY ARTERY BYPASS GRAFTING (CABG);  Surgeon: Ivin Poot, MD;  Location: [facility];  Service: Open Heart Surgery;  Laterality: N/A;  CABG x 1 using left leg greater saphenous vein harvested endoscopically  . Aortic valve replacement N/A 05/12/2013    Procedure: AORTIC VALVE REPLACEMENT (AVR);  Surgeon: Tharon Aquas Trigt,  MD;  Location: [facility];  Service: Open Heart Surgery;  Laterality: N/A;  . Intraoperative transesophageal echocardiogram N/A 05/12/2013    Procedure: INTRAOPERATIVE TRANSESOPHAGEAL ECHOCARDIOGRAM;  Surgeon: Ivin Poot, MD;  Location: [facility];  Service: Open Heart Surgery;  Laterality: N/A;  . Maze N/A 05/12/2013    Procedure: MAZE;  Surgeon: Ivin Poot, MD;  Location: [facility];  Service: Open Heart Surgery;  Laterality: N/A;  . Left and right heart catheterization with coronary angiogram N/A 05/09/2013    Procedure: LEFT AND RIGHT HEART CATHETERIZATION WITH CORONARY ANGIOGRAM;  Surgeon: Lorretta Harp, MD;  Location: [facility] CATH LAB;  Service: Cardiovascular;  Laterality: N/A;     Current Outpatient Prescriptions  Medication Sig Dispense Refill  . amLODipine (NORVASC) 5 MG tablet Take 1 tablet (5 mg total) by mouth daily. 90 tablet 1  . Ascorbic Acid  (VITAMIN C) 100 MG tablet Take 100 mg by mouth daily.    . cholecalciferol (VITAMIN D) 1000 UNITS tablet Take 1,000 Units by mouth daily.    . metoprolol succinate (TOPROL XL) 50 MG 24 hr tablet Take 1 tablet (50 mg total) by mouth 2 (two) times daily. Take with or immediately following a meal. 180 tablet 3  . Omega-3 Fatty Acids (FISH OIL) 1000 MG CAPS Take 2 capsules by mouth daily.    [name] omeprazole (PRILOSEC) 20 MG capsule Take 20 mg by mouth daily.    . simvastatin (ZOCOR) 40 MG tablet Take 40 mg by mouth daily.    . TURMERIC PO Take 1 tablet by mouth daily.    [name] warfarin (COUMADIN) 5 MG tablet Take 1 tablet (5 mg total) by mouth daily at 6 PM. 150 tablet 0   No current facility-administered medications for this visit.    Allergies:   Lasix; Lisinopril; and Kenalog    Social History:  The patient  reports that he has never smoked. He does not have any smokeless tobacco history on file. He reports that he does not drink alcohol or use illicit drugs.   Family History:  The patient's family history includes Breast cancer in his daughter; CVA in his mother; Kidney disease in his father.    ROS:  Please see the history of present illness.   Otherwise, review of systems are positive for joint pains.   All other systems are reviewed and negative.    PHYSICAL EXAM: VS:  BP 136/72 mmHg  Pulse 74  Ht 5\' 9"  (1.753 m)  Wt 180 lb (81.647 kg)  BMI 26.57 kg/m2 , BMI Body mass index is 26.57 kg/(m^2). GEN: Well nourished, well developed, in no acute distress HEENT: normal Neck: no JVD, carotid bruits, or masses Cardiac: RRR; soft systolic murmurs, rubs, or gallops,no edema  Respiratory:  clear to auscultation bilaterally, normal work of breathing GI: soft, nontender, nondistended, + BS MS: no deformity or atrophy Skin: warm and dry, no rash Neuro:  Strength and sensation are intact Psych: euthymic mood, full affect     Recent Labs: 04/30/2014: ALT 16; BUN 14; Creatinine, Ser 0.96;  Hemoglobin 12.7*; Platelets 167; Potassium 3.7; Sodium 137 05/01/2014: Magnesium 2.1; TSH 0.83   Lipid Panel No results found for: CHOL, TRIG, HDL, CHOLHDL, VLDL, LDLCALC, LDLDIRECT   Other studies Reviewed: Additional studies/ records that were reviewed today with results demonstrating: 2/16 ECG showed Atrial flutter.   ASSESSMENT AND PLAN:  1. CAD: Status post single-vessel bypass to the RCA and stent placement in the circumflex several years ago. No angina. Continue  aggressive secondary prevention. 2. AVR: Appears to be functioning well. He needs SBE prophylaxis for dental visits. 3. AFlutter: COumadin for stroke prevention.  INR checks about every 6 weeks.  He prefers this to the Eliquis. He is maintaining sinus rhythm at this point. Continue metoprolol. Bradycardia has not been an issue. It may have been more the digoxin that was causing bradycardia in the past. 4. HTN: Fairly well controlled. Continue current medicines. 5. INR 2.6 today. Refill for Coumadin given.   Current medicines are reviewed at length with the patient today.  The patient concerns regarding his medicines were addressed.  The following changes have been made:  No change  Labs/ tests ordered today include: No orders of the defined types were placed in this encounter.    Recommend 150 minutes/week of aerobic exercise Low fat, low carb, high fiber diet recommended  Disposition:   FU in 6 months   Teresita Madura., MD  03/15/2015 8:13 AM    [facility] [place], [place], [place]  [address] Phone: [phone]; Fax: [phone]

## 2015-04-18 ENCOUNTER — Other Ambulatory Visit: Payer: Self-pay | Admitting: Interventional Cardiology

## 2015-04-26 ENCOUNTER — Ambulatory Visit (INDEPENDENT_AMBULATORY_CARE_PROVIDER_SITE_OTHER): Payer: Medicare Other | Admitting: *Deleted

## 2015-04-26 DIAGNOSIS — Z5181 Encounter for therapeutic drug level monitoring: Secondary | ICD-10-CM

## 2015-04-26 DIAGNOSIS — I4891 Unspecified atrial fibrillation: Secondary | ICD-10-CM

## 2015-04-26 LAB — POCT INR: INR: 2.3

## 2015-05-09 IMAGING — CR DG CHEST 1V PORT
1 series · 1 of 1 positions shown · non-contrast
Comparison: 05/12/2013

CLINICAL DATA: Status post cardiac surgery and valve replacement.

EXAM:
PORTABLE CHEST - 1 VIEW

[AP]
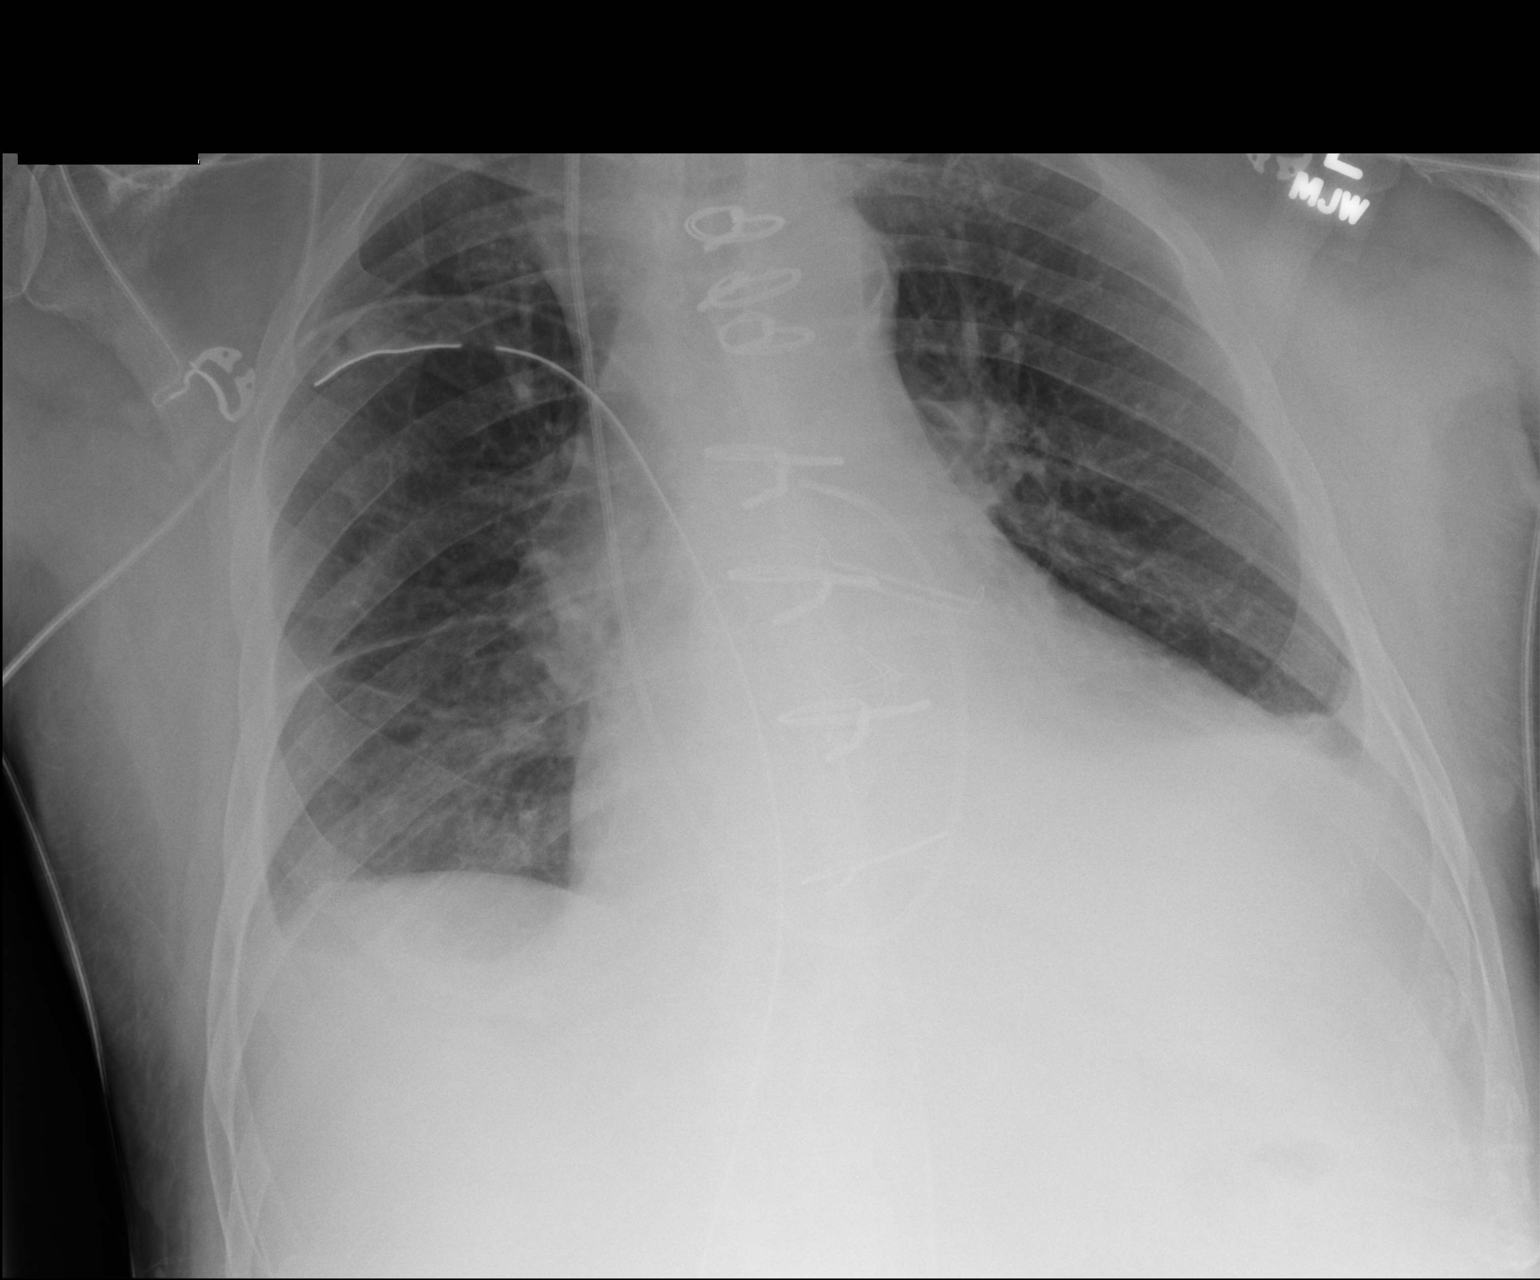

[1 of 1 positions shown; findings below may reference images not displayed]

FINDINGS: Cardiac silhouette is normal size configuration. No mediastinal
widening.

Right internal jugular Swan-Ganz catheter tip is in the artery.
Right chest tube is stable has is a mediastinal tube.

Mild base atelectasis like with minimal effusions. No edema. No
pneumothorax is seen.
IMPRESSION: Status post cardiac surgery. Minimal lung base effusions with
associated atelectasis. No pneumothorax or edema or mediastinal
widening. No evidence of an operative complication.

## 2015-05-10 IMAGING — CR DG CHEST 1V PORT
1 series · 1 of 1 positions shown · non-contrast
Comparison: 05/13/2013

CLINICAL DATA: Status post cardiac surgery

EXAM:
PORTABLE CHEST - 1 VIEW

[AP]
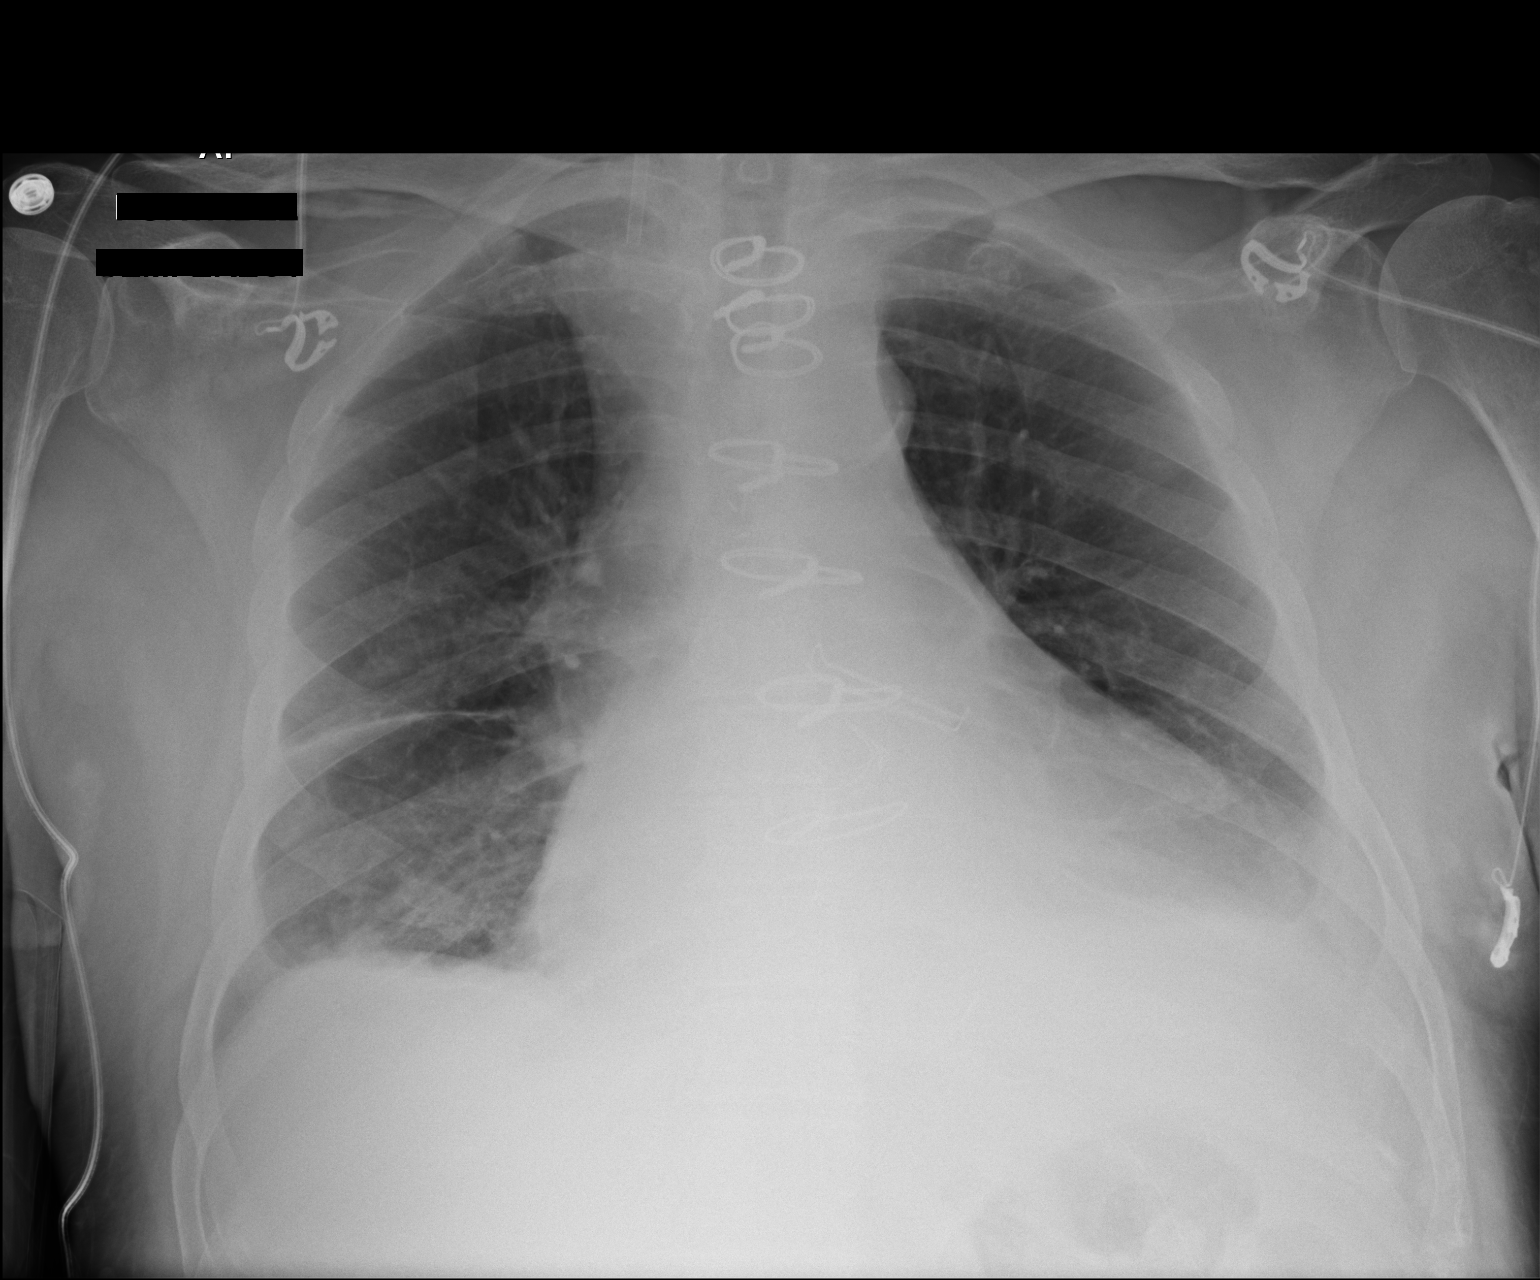

[1 of 1 positions shown; findings below may reference images not displayed]

FINDINGS: Cardiac shadow remains enlarged. Postsurgical changes are again
seen. The right-sided thoracostomy catheter and Swan-Ganz catheter
have been removed. Mediastinal drain is been removed as well. No
recurrent pneumothorax is seen. Minimal thickening of the right
minor fissure is noted. AE right jugular sheath remains in place.
Minimal bibasilar atelectasis is noted.
IMPRESSION: Mild bibasilar changes.

No pneumothorax following chest tube removal.

## 2015-05-11 IMAGING — CR DG CHEST 2V
2 series · 2 of 2 positions shown · non-contrast
Comparison: 05/14/2013

CLINICAL DATA: Aortic valve replacement

EXAM:
CHEST  2 VIEW

[w chest pa]
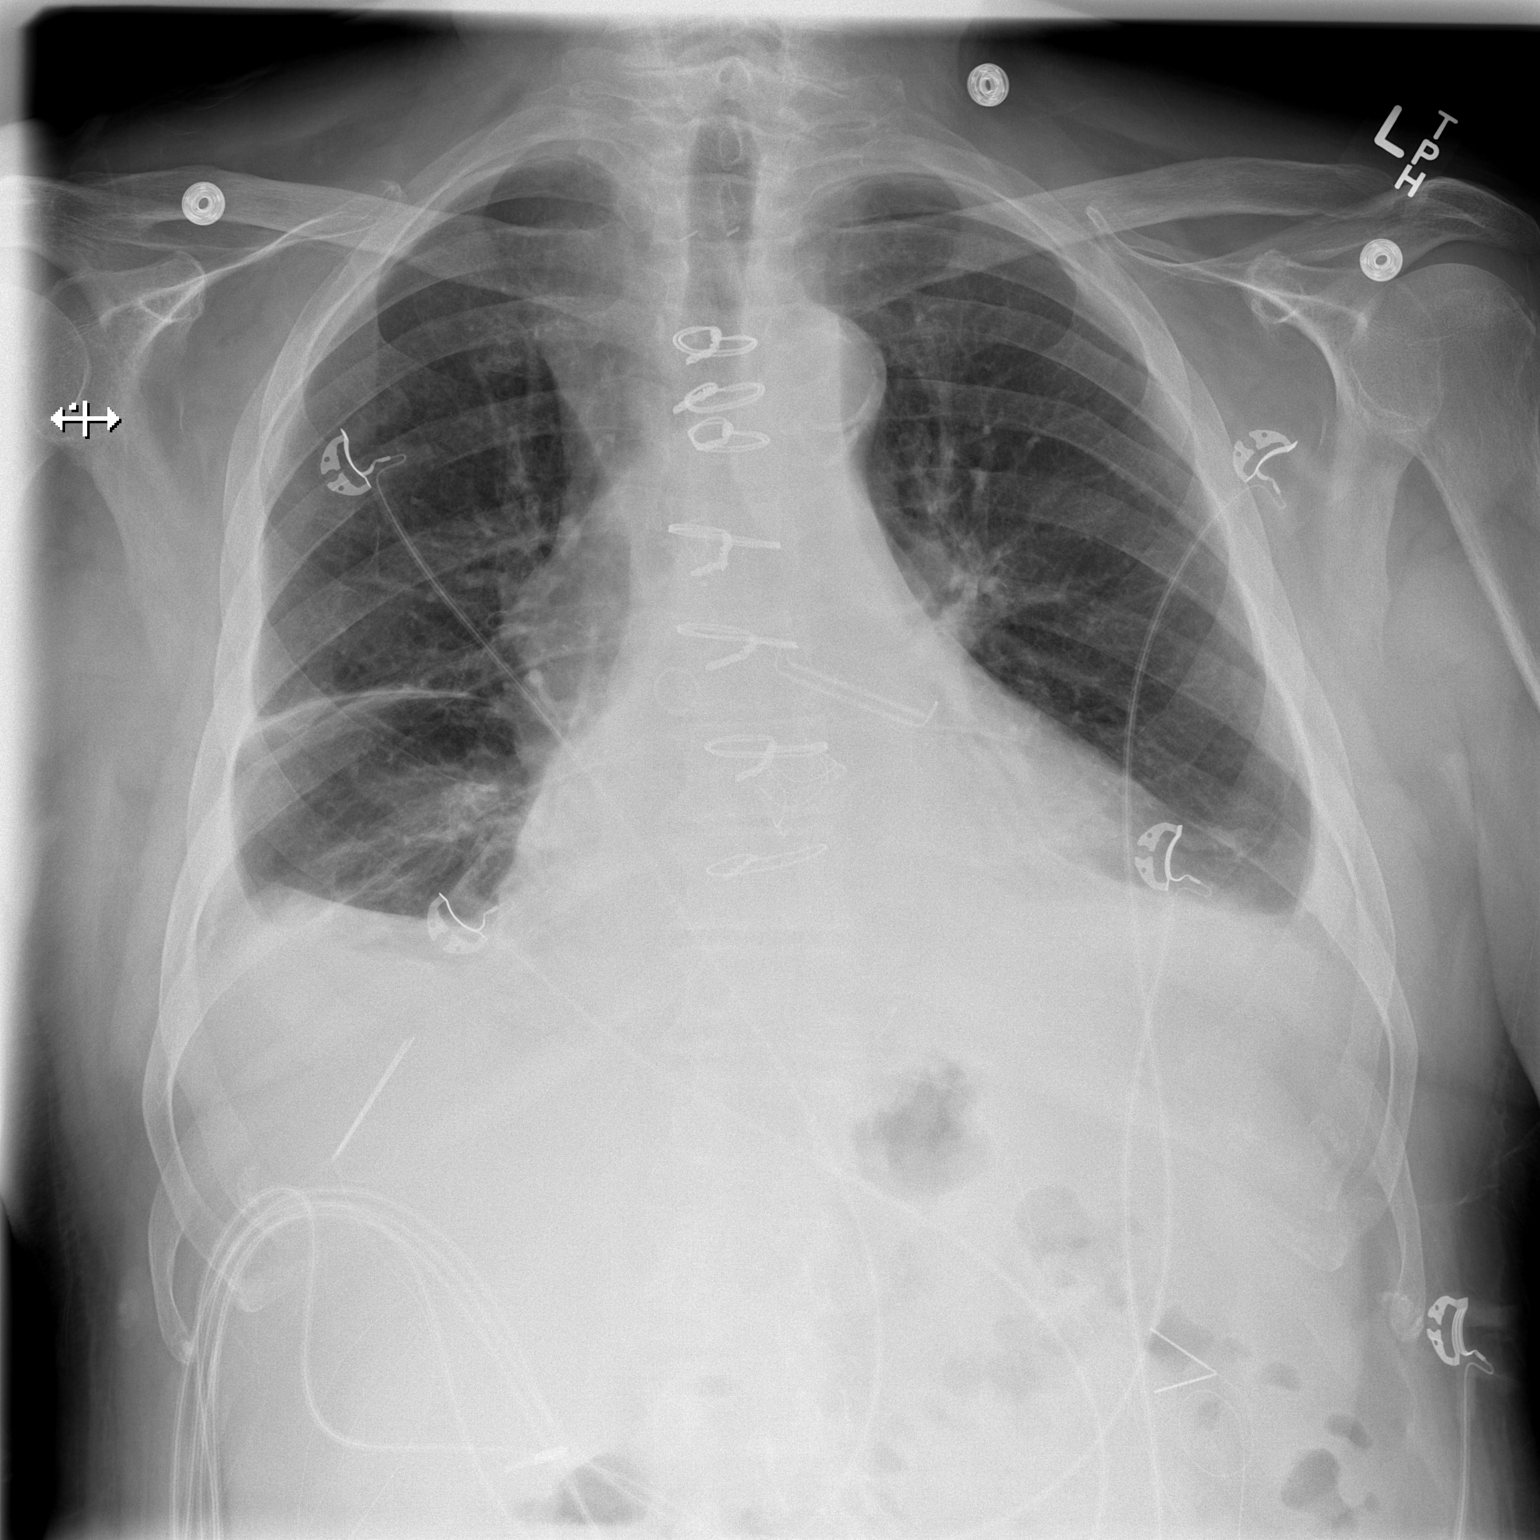

[w chest lat]
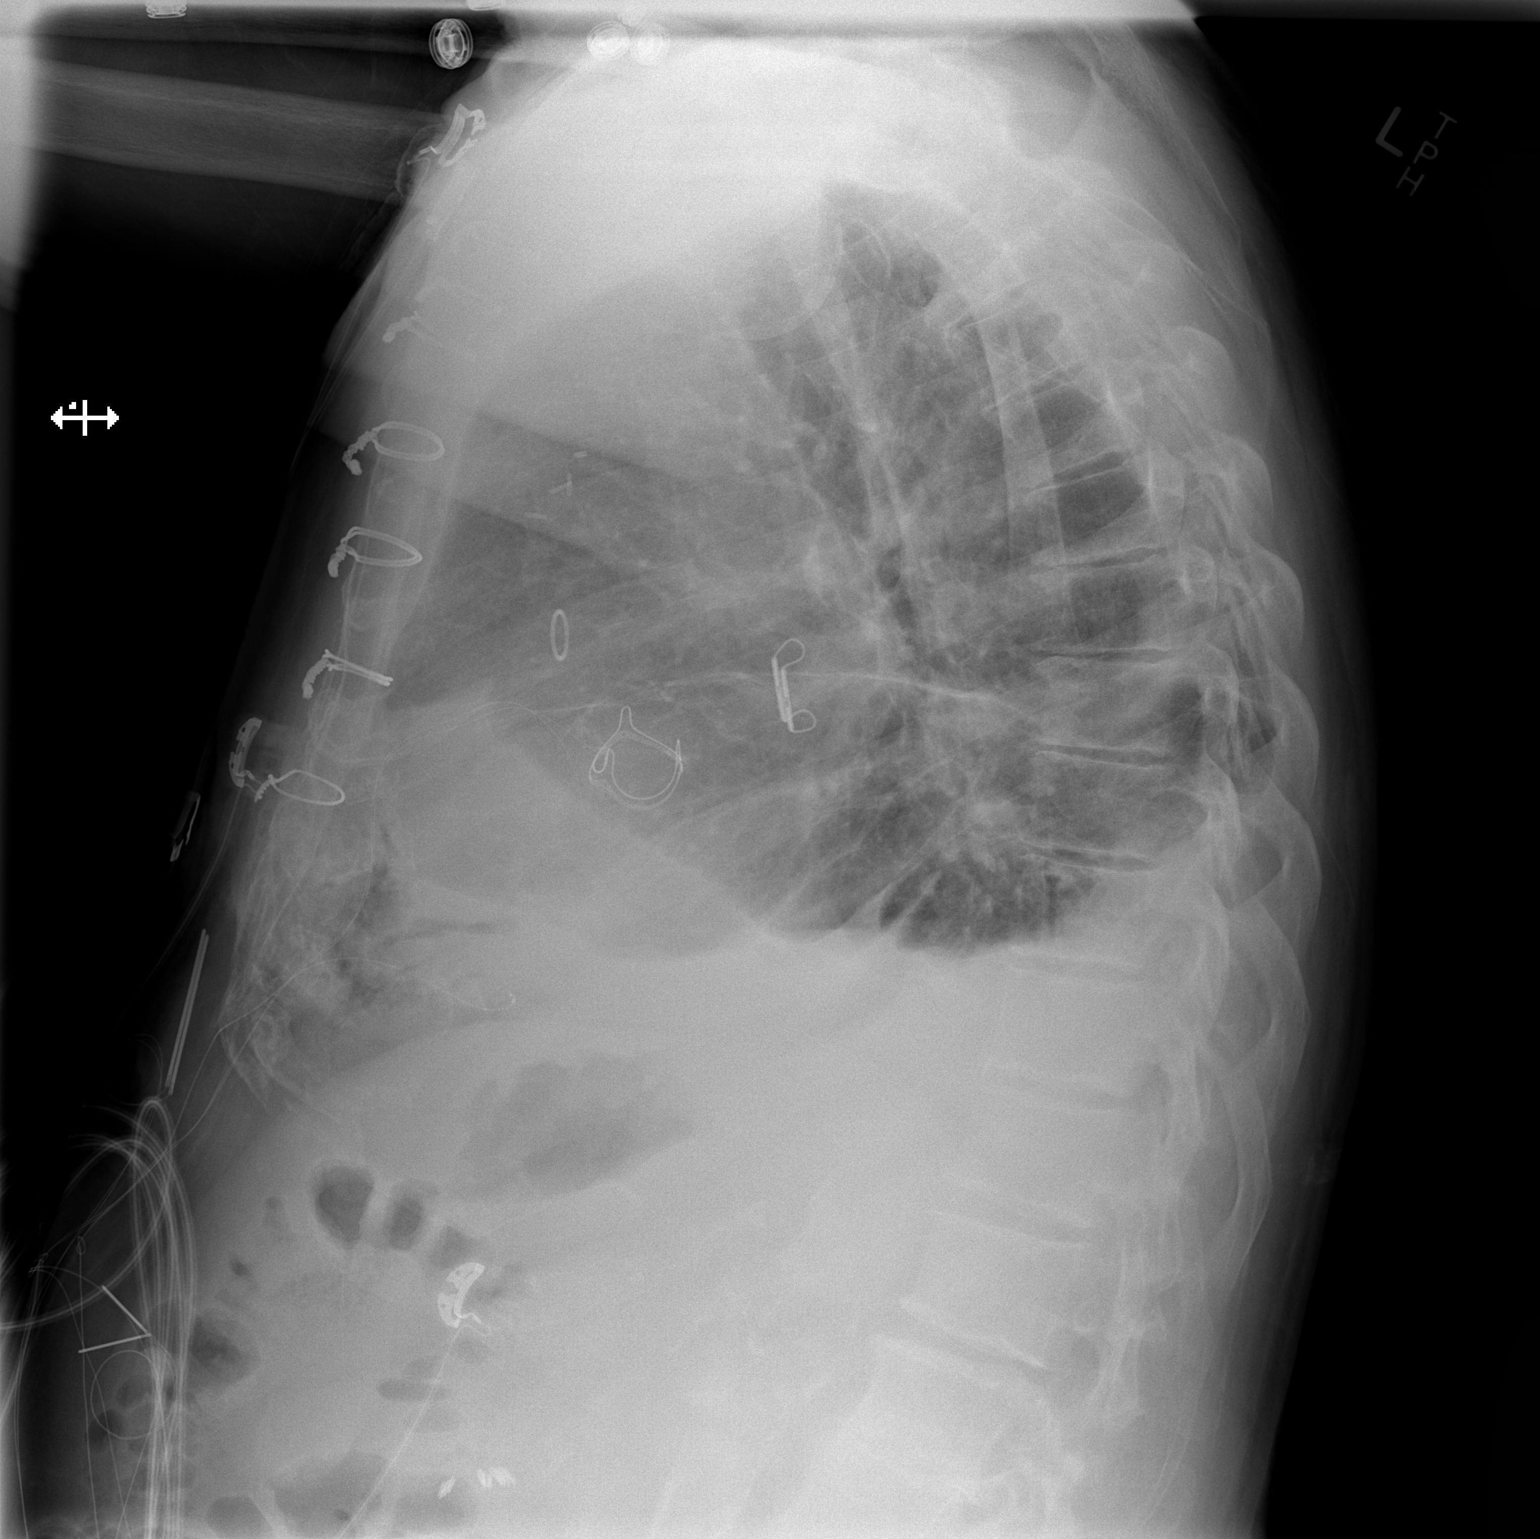

[2 of 2 positions shown; findings below may reference images not displayed]

FINDINGS: Left pleural effusion and left lower lobe atelectasis unchanged.
Slight increase in right lower lobe atelectasis and right pleural
effusion which is small. Negative for heart failure or edema.
IMPRESSION: Mild increase in right lower lobe atelectasis and small right
effusion

Left lower lobe atelectasis and small left effusion are unchanged.

## 2015-06-07 ENCOUNTER — Other Ambulatory Visit: Payer: Self-pay

## 2015-06-07 ENCOUNTER — Ambulatory Visit (INDEPENDENT_AMBULATORY_CARE_PROVIDER_SITE_OTHER): Payer: Medicare Other

## 2015-06-07 DIAGNOSIS — I4891 Unspecified atrial fibrillation: Secondary | ICD-10-CM

## 2015-06-07 DIAGNOSIS — Z5181 Encounter for therapeutic drug level monitoring: Secondary | ICD-10-CM | POA: Diagnosis not present

## 2015-06-07 LAB — POCT INR: INR: 1.7

## 2015-06-07 MED ORDER — AMLODIPINE BESYLATE 5 MG PO TABS: 5.0000 mg | ORAL_TABLET | Freq: Every day | ORAL | Status: DC

## 2015-06-07 MED ORDER — WARFARIN SODIUM 5 MG PO TABS: ORAL_TABLET | ORAL | Status: DC

## 2015-06-07 NOTE — Telephone Encounter (Signed)
Pt needs refill on Norvasc 90 day supply sent to [facility].  Thanks

## 2015-06-28 ENCOUNTER — Ambulatory Visit (INDEPENDENT_AMBULATORY_CARE_PROVIDER_SITE_OTHER): Payer: Medicare Other | Admitting: *Deleted

## 2015-06-28 DIAGNOSIS — Z5181 Encounter for therapeutic drug level monitoring: Secondary | ICD-10-CM | POA: Diagnosis not present

## 2015-06-28 DIAGNOSIS — I4891 Unspecified atrial fibrillation: Secondary | ICD-10-CM

## 2015-06-28 LAB — POCT INR: INR: 2

## 2015-07-26 ENCOUNTER — Ambulatory Visit (INDEPENDENT_AMBULATORY_CARE_PROVIDER_SITE_OTHER): Payer: Medicare Other | Admitting: *Deleted

## 2015-07-26 DIAGNOSIS — I4891 Unspecified atrial fibrillation: Secondary | ICD-10-CM

## 2015-07-26 DIAGNOSIS — Z5181 Encounter for therapeutic drug level monitoring: Secondary | ICD-10-CM | POA: Diagnosis not present

## 2015-07-26 LAB — POCT INR: INR: 2.4

## 2015-08-24 ENCOUNTER — Ambulatory Visit (INDEPENDENT_AMBULATORY_CARE_PROVIDER_SITE_OTHER): Payer: Medicare Other | Admitting: *Deleted

## 2015-08-24 DIAGNOSIS — I2510 Atherosclerotic heart disease of native coronary artery without angina pectoris: Secondary | ICD-10-CM | POA: Diagnosis not present

## 2015-08-24 DIAGNOSIS — Z79899 Other long term (current) drug therapy: Secondary | ICD-10-CM | POA: Diagnosis not present

## 2015-08-24 DIAGNOSIS — I4891 Unspecified atrial fibrillation: Secondary | ICD-10-CM | POA: Diagnosis not present

## 2015-08-24 DIAGNOSIS — I10 Essential (primary) hypertension: Secondary | ICD-10-CM | POA: Diagnosis not present

## 2015-08-24 DIAGNOSIS — Z5181 Encounter for therapeutic drug level monitoring: Secondary | ICD-10-CM

## 2015-08-24 DIAGNOSIS — Z952 Presence of prosthetic heart valve: Secondary | ICD-10-CM | POA: Diagnosis not present

## 2015-08-24 DIAGNOSIS — E785 Hyperlipidemia, unspecified: Secondary | ICD-10-CM | POA: Diagnosis not present

## 2015-08-24 LAB — POCT INR: INR: 2.4

## 2015-10-02 ENCOUNTER — Encounter: Payer: Self-pay | Admitting: Interventional Cardiology

## 2015-10-02 ENCOUNTER — Encounter (INDEPENDENT_AMBULATORY_CARE_PROVIDER_SITE_OTHER): Payer: Self-pay

## 2015-10-02 ENCOUNTER — Ambulatory Visit (INDEPENDENT_AMBULATORY_CARE_PROVIDER_SITE_OTHER): Payer: Medicare Other | Admitting: Interventional Cardiology

## 2015-10-02 ENCOUNTER — Ambulatory Visit (INDEPENDENT_AMBULATORY_CARE_PROVIDER_SITE_OTHER): Payer: Medicare Other | Admitting: *Deleted

## 2015-10-02 VITALS — BP 142/66 | HR 73 | Ht 69.0 in | Wt 182.0 lb

## 2015-10-02 DIAGNOSIS — I4891 Unspecified atrial fibrillation: Secondary | ICD-10-CM | POA: Diagnosis not present

## 2015-10-02 DIAGNOSIS — I350 Nonrheumatic aortic (valve) stenosis: Secondary | ICD-10-CM

## 2015-10-02 DIAGNOSIS — Z954 Presence of other heart-valve replacement: Secondary | ICD-10-CM

## 2015-10-02 DIAGNOSIS — I480 Paroxysmal atrial fibrillation: Secondary | ICD-10-CM | POA: Diagnosis not present

## 2015-10-02 DIAGNOSIS — Z7901 Long term (current) use of anticoagulants: Secondary | ICD-10-CM | POA: Diagnosis not present

## 2015-10-02 DIAGNOSIS — Z952 Presence of prosthetic heart valve: Secondary | ICD-10-CM

## 2015-10-02 DIAGNOSIS — Z5181 Encounter for therapeutic drug level monitoring: Secondary | ICD-10-CM

## 2015-10-02 DIAGNOSIS — I2510 Atherosclerotic heart disease of native coronary artery without angina pectoris: Secondary | ICD-10-CM

## 2015-10-02 LAB — POCT INR: INR: 2.5

## 2015-10-02 NOTE — Patient Instructions (Signed)
**Note De-identified Marico Buckle Obfuscation** Medication Instructions:  Same-no changes  Labwork: None  Testing/Procedures: None  Follow-Up: Your physician wants you to follow-up in: 6 months. You will receive a reminder letter in the mail two months in advance. If you don't receive a letter, please call our office to schedule the follow-up appointment.      If you need a refill on your cardiac medications before your next appointment, please call your pharmacy.   

## 2015-10-02 NOTE — Progress Notes (Signed)
Patient ID: Robert [name], male   DOB: [birth year], 82 y.o.   MRN: [id number]     Cardiology Office Note   Date:  10/02/2015   ID:  Robert [name], DOB Mar 27, 1927, MRN [id number]  PCP:  Henrine Screws, MD    No chief complaint on file. f/u CAD   Wt Readings from Last 3 Encounters:  10/02/15 182 lb (82.6 kg)  03/15/15 180 lb (81.6 kg)  05/17/14 175 lb (79.4 kg)       History of Present Illness: Robert [name] is a 82 y.o. male  who had a BMS to the circumflex in 2011. He had AFib in 2/15. Workup revealed severe AS. He had AVR with single vessel CABG, Maze procedure and left atrial clip in 2015.Later in 2015, He had presyncope and his HR was in the 40s. Resolved with stopping beta blocker and digoxin. He did well for a while.   In 2/16, he had palpitations and went to the ER. He was in atrial flutter 2:1 block. He was sent home. He was started on metoprolol and Eliquis in the clinic the next day. Rhythm converted.    No CP, SHOB, swelling recently.  Eliquis was changed to COumadin due to cost reasons.  He is tolerating this well. He has not acquired many dose adjustments. He has not felt any further fast heart rates. He has not had lightheadedness that was associated with his bradycardia. He has tolerated metoprolol well.  BP and HR are well controlled at home when he checks.  Typically in the 0000000 systolic range, HR in the 65 range.  Walking is difficult due to chronic foot pain.  He remains active in the yard.  He does some light weights and elastic band work.  Chronic left ankle swelling, vein removed for CABG.  Worse at the end of the day.  He has some diuretic prn.  He uses them perhaps a few times a week.    Past Medical History:  Diagnosis Date  . Aortic stenosis    a. s/p tissue AVR 05/2013;  b. Echo (06/2013):  Mod LVH, EF 60-65%, no RWMA, Gr 2 DD, AVR ok (mean 13 mmHg), MAC, mild MR, mod LAE, mild RAE, PASP 46 mmHg (mild pulmo HTN)  . Blood  loss anemia    a. 05/2009 a/w GIB.  [name] CAD (coronary artery disease)    a. Cath 04/2009: BMS to LCx 04/2009; CTO of RCA with L-R collaterals, 40% ostial diag.  . Carpal tunnel syndrome    bilateral, carpal tunnel release x 2  . Esophageal ulcer    a. 05/2009 with hemorrhage - injected with epinephrine-Dr. Herbie Baltimore Buccini.  . Esophagitis    a. 05/2009 a/w GIB.  [name] GERD (gastroesophageal reflux disease)    EGD, Dr. Toney Rakes 2008  . GI bleed    a. 05/2009 as above: with associated anemia. Lower esophageal ulcer with extensive erosive esophagitis and large clot by EGD 05/2009.   [name] Hx of colonoscopy 2008   Dr. Toney Rakes, polyps- 5 yr f/u recommended  . Hyperlipidemia   . Hypertension   . PAF (paroxysmal atrial fibrillation) ([place])    a. Post-op AVR;  b. 04/2014 recurrent.  . Perforated ear drum    right ear drum, tube in left ear drum, Dr. Constance Holster  . Polymyalgia rheumatica ([place])   . Prostate cancer ([place])   . Vitamin D deficiency     Past Surgical History:  Procedure Laterality Date  . AORTIC VALVE REPLACEMENT N/A  05/12/2013   Procedure: AORTIC VALVE REPLACEMENT (AVR);  Surgeon: Ivin Poot, MD;  Location: [facility];  Service: Open Heart Surgery;  Laterality: N/A;  . CARDIAC CATHETERIZATION  2011  . CORONARY ARTERY BYPASS GRAFT N/A 05/12/2013   Procedure: CORONARY ARTERY BYPASS GRAFTING (CABG);  Surgeon: Ivin Poot, MD;  Location: [facility];  Service: Open Heart Surgery;  Laterality: N/A;  CABG x 1 using left leg greater saphenous vein harvested endoscopically  . INTRAOPERATIVE TRANSESOPHAGEAL ECHOCARDIOGRAM N/A 05/12/2013   Procedure: INTRAOPERATIVE TRANSESOPHAGEAL ECHOCARDIOGRAM;  Surgeon: Ivin Poot, MD;  Location: [facility];  Service: Open Heart Surgery;  Laterality: N/A;  . LEFT AND RIGHT HEART CATHETERIZATION WITH CORONARY ANGIOGRAM N/A 05/09/2013   Procedure: LEFT AND RIGHT HEART CATHETERIZATION WITH CORONARY ANGIOGRAM;  Surgeon: Lorretta Harp, MD;  Location: [facility] CATH LAB;   Service: Cardiovascular;  Laterality: N/A;  . MAZE N/A 05/12/2013   Procedure: MAZE;  Surgeon: Ivin Poot, MD;  Location: [facility];  Service: Open Heart Surgery;  Laterality: N/A;  . PROSTATECTOMY       Current Outpatient Prescriptions  Medication Sig Dispense Refill  . amLODipine (NORVASC) 5 MG tablet Take 1 tablet (5 mg total) by mouth daily. 90 tablet 1  . Ascorbic Acid (VITAMIN C) 100 MG tablet Take 100 mg by mouth daily.    . cholecalciferol (VITAMIN D) 1000 UNITS tablet Take 1,000 Units by mouth daily.    . metoprolol succinate (TOPROL-XL) 50 MG 24 hr tablet Take 1 tablet (50 mg total) by mouth 2 (two) times daily. 180 tablet 2  . Omega-3 Fatty Acids (FISH OIL) 1000 MG CAPS Take 2 capsules by mouth daily.    [name] omeprazole (PRILOSEC) 20 MG capsule Take 20 mg by mouth daily.    . simvastatin (ZOCOR) 40 MG tablet Take 40 mg by mouth daily.    . TURMERIC PO Take 1 tablet by mouth daily.    [name] warfarin (COUMADIN) 5 MG tablet Take as directed by [facility] 110 tablet 1   No current facility-administered medications for this visit.     Allergies:   Lasix [furosemide]; Lisinopril; and Kenalog [triamcinolone acetonide]    Social History:  The patient  reports that he has never smoked. He has never used smokeless tobacco. He reports that he does not drink alcohol or use drugs.   Family History:  The patient's family history includes Breast cancer in his daughter; CVA in his mother; Kidney disease in his father.    ROS:  Please see the history of present illness.   Otherwise, review of systems are positive for joint pains.   All other systems are reviewed and negative.    PHYSICAL EXAM: VS:  BP (!) 142/66   Pulse 73   Ht 5\' 9"  (1.753 m)   Wt 182 lb (82.6 kg)   SpO2 96%   BMI 26.88 kg/m  , BMI Body mass index is 26.88 kg/m. GEN: Well nourished, well developed, in no acute distress  HEENT: normal  Neck: no JVD, carotid bruits, or masses Cardiac: RRR; soft systolic murmurs,  rubs, or gallops,no edema  Respiratory:  clear to auscultation bilaterally, normal work of breathing GI: soft, nontender, nondistended, + BS MS: no deformity or atrophy  Skin: warm and dry, no rash Neuro:  Strength and sensation are intact Psych: euthymic mood, full affect     Recent Labs: No results found for requested labs within last 8760 hours.   Lipid Panel No results found for: CHOL,  TRIG, HDL, CHOLHDL, VLDL, LDLCALC, LDLDIRECT   Other studies Reviewed: Additional studies/ records that were reviewed today with results demonstrating: 2/16 ECG showed Atrial flutter.   ASSESSMENT AND PLAN:  1. CAD: Status post single-vessel bypass to the RCA and stent placement in the circumflex several years ago. No angina. Continue aggressive secondary prevention.  COntinue simvastatin. 2. AVR: Appears to be functioning well. He needs SBE prophylaxis for dental visits- false teeth. 3. AFlutter: COumadin for stroke prevention.  INR checks about every 6 weeks.  He prefers this to the Eliquis. He is maintaining sinus rhythm at this point. Continue metoprolol. Bradycardia has not been an issue. It may have been more the digoxin that was causing bradycardia in the past in 2015. 4. HTN: at home, well controlled. Higher today. Continue current medicines. 5. INR 2.5 today. Refill for Coumadin given.   Current medicines are reviewed at length with the patient today.  The patient concerns regarding his medicines were addressed.  The following changes have been made:  No change  Labs/ tests ordered today include: No orders of the defined types were placed in this encounter.   Recommend 150 minutes/week of aerobic exercise Low fat, low carb, high fiber diet recommended  Disposition:   FU in 6 months   Signed, Larae Grooms, MD  10/02/2015 8:12 AM    [facility] [place], [place], [place]  [address] Phone: [phone]; Fax: [phone]

## 2015-10-26 ENCOUNTER — Other Ambulatory Visit: Payer: Self-pay | Admitting: Interventional Cardiology

## 2015-11-15 ENCOUNTER — Ambulatory Visit (INDEPENDENT_AMBULATORY_CARE_PROVIDER_SITE_OTHER): Payer: Medicare Other | Admitting: *Deleted

## 2015-11-15 ENCOUNTER — Encounter (INDEPENDENT_AMBULATORY_CARE_PROVIDER_SITE_OTHER): Payer: Self-pay

## 2015-11-15 DIAGNOSIS — I4891 Unspecified atrial fibrillation: Secondary | ICD-10-CM

## 2015-11-15 DIAGNOSIS — Z5181 Encounter for therapeutic drug level monitoring: Secondary | ICD-10-CM | POA: Diagnosis not present

## 2015-11-15 LAB — POCT INR: INR: 2.2

## 2015-12-04 DIAGNOSIS — R600 Localized edema: Secondary | ICD-10-CM | POA: Diagnosis not present

## 2015-12-04 DIAGNOSIS — I10 Essential (primary) hypertension: Secondary | ICD-10-CM | POA: Diagnosis not present

## 2015-12-04 DIAGNOSIS — M79671 Pain in right foot: Secondary | ICD-10-CM | POA: Diagnosis not present

## 2015-12-27 ENCOUNTER — Ambulatory Visit (INDEPENDENT_AMBULATORY_CARE_PROVIDER_SITE_OTHER): Payer: Medicare Other

## 2015-12-27 DIAGNOSIS — I4891 Unspecified atrial fibrillation: Secondary | ICD-10-CM | POA: Diagnosis not present

## 2015-12-27 DIAGNOSIS — Z5181 Encounter for therapeutic drug level monitoring: Secondary | ICD-10-CM | POA: Diagnosis not present

## 2015-12-27 LAB — POCT INR: INR: 2.3

## 2015-12-31 DIAGNOSIS — Z23 Encounter for immunization: Secondary | ICD-10-CM | POA: Diagnosis not present

## 2016-01-04 ENCOUNTER — Other Ambulatory Visit: Payer: Self-pay | Admitting: Internal Medicine

## 2016-01-04 DIAGNOSIS — Z7901 Long term (current) use of anticoagulants: Secondary | ICD-10-CM | POA: Diagnosis not present

## 2016-01-04 DIAGNOSIS — Z8546 Personal history of malignant neoplasm of prostate: Secondary | ICD-10-CM | POA: Diagnosis not present

## 2016-01-04 DIAGNOSIS — I890 Lymphedema, not elsewhere classified: Secondary | ICD-10-CM | POA: Diagnosis not present

## 2016-01-04 DIAGNOSIS — M7989 Other specified soft tissue disorders: Secondary | ICD-10-CM

## 2016-01-04 DIAGNOSIS — R600 Localized edema: Secondary | ICD-10-CM | POA: Diagnosis not present

## 2016-01-04 DIAGNOSIS — M79605 Pain in left leg: Secondary | ICD-10-CM

## 2016-01-05 ENCOUNTER — Emergency Department (HOSPITAL_BASED_OUTPATIENT_CLINIC_OR_DEPARTMENT_OTHER): Payer: Medicare Other

## 2016-01-05 ENCOUNTER — Emergency Department (HOSPITAL_BASED_OUTPATIENT_CLINIC_OR_DEPARTMENT_OTHER)
Admission: EM | Admit: 2016-01-05 | Discharge: 2016-01-05 | Disposition: A | Discharge status: Home / Self Care | Payer: Medicare Other | Attending: Emergency Medicine | Admitting: Emergency Medicine

## 2016-01-05 ENCOUNTER — Encounter (HOSPITAL_BASED_OUTPATIENT_CLINIC_OR_DEPARTMENT_OTHER): Payer: Self-pay | Admitting: Emergency Medicine

## 2016-01-05 DIAGNOSIS — I714 Abdominal aortic aneurysm, without rupture: Secondary | ICD-10-CM | POA: Diagnosis not present

## 2016-01-05 DIAGNOSIS — Z79899 Other long term (current) drug therapy: Secondary | ICD-10-CM | POA: Insufficient documentation

## 2016-01-05 DIAGNOSIS — M7989 Other specified soft tissue disorders: Secondary | ICD-10-CM | POA: Diagnosis not present

## 2016-01-05 DIAGNOSIS — I2510 Atherosclerotic heart disease of native coronary artery without angina pectoris: Secondary | ICD-10-CM | POA: Diagnosis not present

## 2016-01-05 DIAGNOSIS — Z8546 Personal history of malignant neoplasm of prostate: Secondary | ICD-10-CM | POA: Insufficient documentation

## 2016-01-05 DIAGNOSIS — I509 Heart failure, unspecified: Secondary | ICD-10-CM | POA: Diagnosis not present

## 2016-01-05 DIAGNOSIS — I871 Compression of vein: Secondary | ICD-10-CM | POA: Diagnosis not present

## 2016-01-05 DIAGNOSIS — I110 Hypertensive heart disease with heart failure: Secondary | ICD-10-CM | POA: Insufficient documentation

## 2016-01-05 LAB — CBC WITH DIFFERENTIAL/PLATELET
Basophils Absolute: 0 10*3/uL (ref 0.0–0.1)
Basophils Relative: 0 %
Eosinophils Absolute: 0.1 10*3/uL (ref 0.0–0.7)
Eosinophils Relative: 2 %
HCT: 38.6 % — ABNORMAL LOW (ref 39.0–52.0)
Hemoglobin: 13.1 g/dL (ref 13.0–17.0)
Lymphocytes Relative: 23 %
Lymphs Abs: 1.4 10*3/uL (ref 0.7–4.0)
MCH: 32.2 pg (ref 26.0–34.0)
MCHC: 33.9 g/dL (ref 30.0–36.0)
MCV: 94.8 fL (ref 78.0–100.0)
Monocytes Absolute: 0.5 10*3/uL (ref 0.1–1.0)
Monocytes Relative: 8 %
Neutro Abs: 4.1 10*3/uL (ref 1.7–7.7)
Neutrophils Relative %: 67 %
Platelets: 134 10*3/uL — ABNORMAL LOW (ref 150–400)
RBC: 4.07 MIL/uL — ABNORMAL LOW (ref 4.22–5.81)
RDW: 15 % (ref 11.5–15.5)
WBC: 6.1 10*3/uL (ref 4.0–10.5)

## 2016-01-05 LAB — BASIC METABOLIC PANEL
Anion gap: 9 (ref 5–15)
BUN: 10 mg/dL (ref 6–20)
CO2: 26 mmol/L (ref 22–32)
Calcium: 9.1 mg/dL (ref 8.9–10.3)
Chloride: 104 mmol/L (ref 101–111)
Creatinine, Ser: 0.67 mg/dL (ref 0.61–1.24)
GFR calc Af Amer: >60 mL/min (ref >60)
GFR calc non Af Amer: >60 mL/min (ref >60)
Glucose, Bld: 99 mg/dL (ref 65–99)
Potassium: 3.8 mmol/L (ref 3.5–5.1)
Sodium: 139 mmol/L (ref 135–145)

## 2016-01-05 LAB — PROTIME-INR
INR: 1.59
Prothrombin Time: 19.1 seconds — ABNORMAL HIGH (ref 11.4–15.2)

## 2016-01-05 MED ORDER — IOPAMIDOL (ISOVUE-300) INJECTION 61%
100.0000 mL | Freq: Once | INTRAVENOUS | Status: AC | PRN
Start: 2016-01-05 — End: 2016-01-05
  Administered 2016-01-05: 100 mL via INTRAVENOUS

## 2016-01-05 NOTE — ED Provider Notes (Signed)
[facility] DEPT [facility] Provider Note   CSN: [id number] Arrival date & time: 01/05/16  1338  By signing my name below, I, Arianna Nassar, attest that this documentation has been prepared under the direction and in the presence of Gareth Morgan, MD.  Electronically Signed: Julien Nordmann, ED Scribe. 01/06/16. 12:28 PM.    History   Chief Complaint Chief Complaint  Patient presents with  . Leg Swelling    The history is provided by the patient. No language interpreter was used.   HPI Comments: Robert [name] is a 82 y.o. male who has a PMhx of aortic stenosis, CAD, GI bleed, afib with RVR and PAF presents to the Emergency Department complaining of gradual worsening, moderate, left leg swelling x 3 weeks. Pt states that his leg swelling started a few months ago but states it became progressively worse about 3 weeks ago. Pt notes having no pain in his left leg. He was seen by his PCP yesterday for his leg swelling and was advised to come to the ED for possible rule out of DVT. He was recently taken off of amlodipine by PCP to help with his leg swelling without relief. He is currently on coumadin and notes his levels have been normal when they were last checked about 1 week ago. Pt has a scheduled [place] on Tuesday 11/7. Denies hx of DVT in legs or lungs, chest pain,  shortness of breath, fever, cough, nausea, vomiting, diarrhea, melena, blood in stool, light-headedness, or syncope.   PCP: Henrine Screws, MD   Past Medical History:  Diagnosis Date  . Aortic stenosis    a. s/p tissue AVR 05/2013;  b. Echo (06/2013):  Mod LVH, EF 60-65%, no RWMA, Gr 2 DD, AVR ok (mean 13 mmHg), MAC, mild MR, mod LAE, mild RAE, PASP 46 mmHg (mild pulmo HTN)  . Blood loss anemia    a. 05/2009 a/w GIB.  [name] CAD (coronary artery disease)    a. Cath 04/2009: BMS to LCx 04/2009; CTO of RCA with L-R collaterals, 40% ostial diag.  . Carpal tunnel syndrome    bilateral, carpal tunnel release x 2  . Esophageal  ulcer    a. 05/2009 with hemorrhage - injected with epinephrine-Dr. Herbie Baltimore Buccini.  . Esophagitis    a. 05/2009 a/w GIB.  [name] GERD (gastroesophageal reflux disease)    EGD, Dr. Toney Rakes 2008  . GI bleed    a. 05/2009 as above: with associated anemia. Lower esophageal ulcer with extensive erosive esophagitis and large clot by EGD 05/2009.   [name] Hx of colonoscopy 2008   Dr. Toney Rakes, polyps- 5 yr f/u recommended  . Hyperlipidemia   . Hypertension   . PAF (paroxysmal atrial fibrillation) ([place])    a. Post-op AVR;  b. 04/2014 recurrent.  . Perforated ear drum    right ear drum, tube in left ear drum, Dr. Constance Holster  . Polymyalgia rheumatica ([place])   . Prostate cancer ([place])   . Vitamin D deficiency     Patient Active Problem List   Diagnosis Date Noted  . Encounter for therapeutic drug monitoring 06/02/2014  . Chronic anticoagulation 05/17/2014  . Atrial flutter ([place]) 06/08/2013  . S/P AVR 05/12/2013  . Acute CHF ([place]) 05/06/2013  . Atrial fibrillation with RVR ([place]) 05/06/2013  . Aortic stenosis   . Coronary atherosclerosis of native coronary artery 04/21/2013  . Essential hypertension, benign 04/21/2013  . Mixed hyperlipidemia 04/21/2013  . Reflux esophagitis 04/21/2013  . PAF (paroxysmal atrial fibrillation) ([place]) 04/21/2013  Past Surgical History:  Procedure Laterality Date  . AORTIC VALVE REPLACEMENT N/A 05/12/2013   Procedure: AORTIC VALVE REPLACEMENT (AVR);  Surgeon: Ivin Poot, MD;  Location: [facility];  Service: Open Heart Surgery;  Laterality: N/A;  . CARDIAC CATHETERIZATION  2011  . CORONARY ARTERY BYPASS GRAFT N/A 05/12/2013   Procedure: CORONARY ARTERY BYPASS GRAFTING (CABG);  Surgeon: Ivin Poot, MD;  Location: [facility];  Service: Open Heart Surgery;  Laterality: N/A;  CABG x 1 using left leg greater saphenous vein harvested endoscopically  . INTRAOPERATIVE TRANSESOPHAGEAL ECHOCARDIOGRAM N/A 05/12/2013   Procedure: INTRAOPERATIVE TRANSESOPHAGEAL ECHOCARDIOGRAM;   Surgeon: Ivin Poot, MD;  Location: [facility];  Service: Open Heart Surgery;  Laterality: N/A;  . LEFT AND RIGHT HEART CATHETERIZATION WITH CORONARY ANGIOGRAM N/A 05/09/2013   Procedure: LEFT AND RIGHT HEART CATHETERIZATION WITH CORONARY ANGIOGRAM;  Surgeon: Lorretta Harp, MD;  Location: [facility] CATH LAB;  Service: Cardiovascular;  Laterality: N/A;  . MAZE N/A 05/12/2013   Procedure: MAZE;  Surgeon: Ivin Poot, MD;  Location: [facility];  Service: Open Heart Surgery;  Laterality: N/A;  . PROSTATECTOMY         Home Medications    Prior to Admission medications   Medication Sig Start Date End Date Taking? Authorizing Provider  Ascorbic Acid (VITAMIN C) 100 MG tablet Take 100 mg by mouth daily.    Historical Provider, MD  cholecalciferol (VITAMIN D) 1000 UNITS tablet Take 1,000 Units by mouth daily.    Historical Provider, MD  losartan (COZAAR) 25 MG tablet Take 25 mg by mouth daily.    Historical Provider, MD  metoprolol succinate (TOPROL-XL) 50 MG 24 hr tablet Take 1 tablet (50 mg total) by mouth 2 (two) times daily. 04/18/15   Jettie Booze, MD  Omega-3 Fatty Acids (FISH OIL) 1000 MG CAPS Take 2 capsules by mouth daily.    Historical Provider, MD  omeprazole (PRILOSEC) 20 MG capsule Take 20 mg by mouth daily.    Historical Provider, MD  simvastatin (ZOCOR) 40 MG tablet Take 40 mg by mouth daily.    Historical Provider, MD  TURMERIC PO Take 1 tablet by mouth daily.    Historical Provider, MD  warfarin (COUMADIN) 5 MG tablet TAKE AS DIRECTED BY COUMADIN CLINIC 10/26/15   Jettie Booze, MD    Family History Family History  Problem Relation Age of Onset  . CVA Mother   . Kidney disease Father   . Breast cancer Daughter     Social History Social History  Substance Use Topics  . Smoking status: Never Smoker  . Smokeless tobacco: Never Used  . Alcohol use No     Allergies   Lasix [furosemide]; Lisinopril; and Kenalog [triamcinolone acetonide]   Review of Systems Review  of Systems  Constitutional: Negative for fever.  HENT: Negative for sore throat.   Eyes: Negative for visual disturbance.  Respiratory: Negative for cough and shortness of breath.   Cardiovascular: Positive for leg swelling. Negative for chest pain.  Gastrointestinal: Negative for abdominal pain, blood in stool, diarrhea, nausea and vomiting.  Genitourinary: Negative for difficulty urinating.  Musculoskeletal: Negative for back pain and neck stiffness.  Skin: Negative for rash.  Neurological: Negative for syncope, light-headedness and headaches.     Physical Exam Updated Vital Signs BP 137/77 (BP Location: Left Arm)   Pulse 60   Temp 98.1 F (36.7 C)   Resp 20   Wt 185 lb (83.9 kg)   SpO2 99%  BMI 27.32 kg/m   Physical Exam  Constitutional: He is oriented to person, place, and time. He appears well-developed and well-nourished.  HENT:  Head: Normocephalic and atraumatic.  Eyes: EOM are normal.  Neck: Normal range of motion.  Cardiovascular: Normal rate, regular rhythm, normal heart sounds and intact distal pulses.   Pulmonary/Chest: Effort normal and breath sounds normal. No respiratory distress.  Abdominal: Soft. He exhibits no distension. There is no tenderness.  Musculoskeletal: Normal range of motion. He exhibits edema. He exhibits no tenderness.  Exam limited to left leg due to swelling Palpable pulses bilaterally Left lower extremity edema, 2+  Chronic skin changes  Neurological: He is alert and oriented to person, place, and time.  Skin: Skin is warm and dry.  Psychiatric: He has a normal mood and affect. Judgment normal.  Nursing note and vitals reviewed.    ED Treatments / Results  DIAGNOSTIC STUDIES: Oxygen Saturation is 100% on RA, normal by my interpretation.  COORDINATION OF CARE:  4:05 PM Discussed treatment plan with pt at bedside and pt agreed to plan.  Labs (all labs ordered are listed, but only abnormal results are displayed) Labs Reviewed    CBC WITH DIFFERENTIAL/PLATELET - Abnormal; Notable for the following:       Result Value   RBC 4.07 (*)    HCT 38.6 (*)    Platelets 134 (*)    All other components within normal limits  PROTIME-INR - Abnormal; Notable for the following:    Prothrombin Time 19.1 (*)    All other components within normal limits  BASIC METABOLIC PANEL    EKG  EKG Interpretation None       Radiology Ct Angio Low Extrem Left W &/or Wo Contrast  Result Date: 01/05/2016 CLINICAL DATA:  82 year old male with a history of left lower extremity swelling. Prior duplex demonstrates possible iliac occlusion or stenosis. EXAM: CTA ABDOMEN PELVIS WITH RUNOFF LOWER EXTREMITY WITH CONTRAST TECHNIQUE: Multidetector CT imaging of the abdomen and pelvis with runoff of the bilateral lower extremity was performed using the standard protocol during bolus administration of intravenous contrast. Multiplanar reconstructed images and MIPs were obtained and reviewed to evaluate the vascular anatomy. CONTRAST:  134mL ISOVUE-300 IOPAMIDOL (ISOVUE-300) INJECTION 61% COMPARISON:  CT 10/11/2010 FINDINGS: VASCULAR Aorta: Atherosclerotic changes of the abdominal aorta. No aneurysm or dissection flap. No periaortic fluid. Dense calcifications of the aortic valve. Celiac: Significant atherosclerotic changes at the origin of the celiac artery which appears to remain patent. SMA: Significant atherosclerotic changes at the origin of the superior mesenteric artery which appears remain patent. Renals: Atherosclerotic changes at the origin the bilateral renal arteries. Symmetric perfusion bilaterally. IMA: Small caliber are inferior mesenteric artery, with patency difficult to assess. Right lower extremity: Unremarkable course, caliber, and contour of the right iliac system. No aneurysm, dissection, or occlusion. Hypogastric artery is patent. Anterior and posterior division patent. Common femoral artery patent. Proximal SFA and profunda femoris  patent. Atherosclerotic changes. Timing of the contrast bolus limits evaluation of the arterial system. Atherosclerotic changes present within the femoral popliteal and proximal tibial vessels. Left lower extremity: Unremarkable course, caliber, and contour of the left iliac system. No aneurysm, dissection, or occlusion. Hypogastric artery is patent. Anterior and posterior division patent. Common femoral artery patent. Proximal SFA and profunda femoris patent. Atherosclerotic changes. Timing of the contrast bolus limits evaluation of the arterial system. Atherosclerotic changes are present of the femoral popliteal system and proximal tibial vasculature. Superficial soft tissue swelling of the left lower  extremity, asymmetric when compared to the right. Veins: Portal system unremarkable.  Superior mesenteric vein patent. Suboptimal opacification of the IVC in the iliac system. On the right there is not appear to be external compression of the right iliac system. On the left, there are surgical changes at the left pelvic sidewall which were present on the comparison CT adjacent to the vein. There is ill-defined soft tissue which has developed in the interval, asymmetric to the right in the region of the surgical clips. Ill-defined soft tissue inseparable from the left external iliac venous system. Review of the MIP images confirms the above findings. NON-VASCULAR Lower chest: Scarring/ atelectasis of the bilateral lower lobes. Hiatal hernia. Hepatobiliary: Unremarkable appearance of the liver. Cholecystectomy. Pancreas: Unremarkable appearance of the pancreas. No pericholecystic fluid or inflammatory changes. Unremarkable ductal system. Spleen: Unremarkable. Adrenals/Urinary Tract: Unremarkable appearance of adrenal glands. Right: No hydronephrosis. Symmetric perfusion to the left. No nephrolithiasis. Unremarkable course of the right ureter. Left: No hydronephrosis. Symmetric perfusion to the right. No  nephrolithiasis. Unremarkable course of the left ureter. Unremarkable appearance of the urinary bladder . Stomach/Bowel: Unremarkable appearance of the stomach. Unremarkable appearance of small bowel. No evidence of obstruction. Colonic diverticula. No associated inflammatory changes. Normal appendix. Lymphatic: Surgical changes in the region of the prostate, similar to prior. Since the prior CT there has been ill-defined soft tissue development associate with the left pelvic side wall in the region of the surgical clips/nodal dissection. This soft tissue is inseparable from the left-sided external iliac venous system. Interval development of lymph nodes adjacent to the left common iliac vein with short axis diameter measuring 1.4 cm. Additional new lymph nodes in the highest nodal stations of the left iliac nodes. Multiple borderline enlarged inguinal lymph nodes. Mesenteric: No free fluid or air. No adenopathy. Reproductive: Unremarkable appearance of the pelvic organs. Other: No hernia. Musculoskeletal: No displaced fracture. Degenerative changes of the bilateral hips. Degenerative changes of the spine. IMPRESSION: No acute finding identified. Interval development of ill-defined soft tissue on the left pelvic sidewall associated with the surgical clips of prior nodal dissection in this patient with a history of prostate carcinoma. This soft tissue is inseparable from the left external iliac vein, potentially compromising the venous flow given the history of left lower extremity swelling and changes on the prior DVT study. Tissue is suspicious for nodal recurrence/progression given the additional left-sided iliac lymph nodes which are new from the prior. Alternatively, these may reflect treatment changes and/or scarring. Referral for urologic evaluation as well as correlation with lab values recommended. These results were called by telephone at the time of interpretation on 01/05/2016 at 7:35 pm to Dr. Gareth Morgan , who verbally acknowledged these results. Left lower extremity superficial swelling. Bilateral inguinal lymph nodes, non-specific. Aortic atherosclerosis, with associated atherosclerotic changes of the bilateral iliac system, femoral popliteal system, and proximal tibial vessels. Vascular calcifications at the origin of the mesenteric vessels and renal vessels. Dense calcifications of the aortic valve, incompletely imaged. Signed, Dulcy Fanny. Earleen Newport, DO [facility] Radiology Electronically Signed   By: Corrie Mckusick D.O.   On: 01/05/2016 19:41   Ct Angio Low Extrem Right W &/or Wo Contrast  Result Date: 01/05/2016 CLINICAL DATA:  82 year old male with a history of left lower extremity swelling. Prior duplex demonstrates possible iliac occlusion or stenosis. EXAM: CTA ABDOMEN PELVIS WITH RUNOFF LOWER EXTREMITY WITH CONTRAST TECHNIQUE: Multidetector CT imaging of the abdomen and pelvis with runoff of the bilateral lower  extremity was performed using the standard protocol during bolus administration of intravenous contrast. Multiplanar reconstructed images and MIPs were obtained and reviewed to evaluate the vascular anatomy. CONTRAST:  148mL ISOVUE-300 IOPAMIDOL (ISOVUE-300) INJECTION 61% COMPARISON:  CT 10/11/2010 FINDINGS: VASCULAR Aorta: Atherosclerotic changes of the abdominal aorta. No aneurysm or dissection flap. No periaortic fluid. Dense calcifications of the aortic valve. Celiac: Significant atherosclerotic changes at the origin of the celiac artery which appears to remain patent. SMA: Significant atherosclerotic changes at the origin of the superior mesenteric artery which appears remain patent. Renals: Atherosclerotic changes at the origin the bilateral renal arteries. Symmetric perfusion bilaterally. IMA: Small caliber are inferior mesenteric artery, with patency difficult to assess. Right lower extremity: Unremarkable course, caliber, and  contour of the right iliac system. No aneurysm, dissection, or occlusion. Hypogastric artery is patent. Anterior and posterior division patent. Common femoral artery patent. Proximal SFA and profunda femoris patent. Atherosclerotic changes. Timing of the contrast bolus limits evaluation of the arterial system. Atherosclerotic changes present within the femoral popliteal and proximal tibial vessels. Left lower extremity: Unremarkable course, caliber, and contour of the left iliac system. No aneurysm, dissection, or occlusion. Hypogastric artery is patent. Anterior and posterior division patent. Common femoral artery patent. Proximal SFA and profunda femoris patent. Atherosclerotic changes. Timing of the contrast bolus limits evaluation of the arterial system. Atherosclerotic changes are present of the femoral popliteal system and proximal tibial vasculature. Superficial soft tissue swelling of the left lower extremity, asymmetric when compared to the right. Veins: Portal system unremarkable.  Superior mesenteric vein patent. Suboptimal opacification of the IVC in the iliac system. On the right there is not appear to be external compression of the right iliac system. On the left, there are surgical changes at the left pelvic sidewall which were present on the comparison CT adjacent to the vein. There is ill-defined soft tissue which has developed in the interval, asymmetric to the right in the region of the surgical clips. Ill-defined soft tissue inseparable from the left external iliac venous system. Review of the MIP images confirms the above findings. NON-VASCULAR Lower chest: Scarring/ atelectasis of the bilateral lower lobes. Hiatal hernia. Hepatobiliary: Unremarkable appearance of the liver. Cholecystectomy. Pancreas: Unremarkable appearance of the pancreas. No pericholecystic fluid or inflammatory changes. Unremarkable ductal system. Spleen: Unremarkable. Adrenals/Urinary Tract: Unremarkable appearance of  adrenal glands. Right: No hydronephrosis. Symmetric perfusion to the left. No nephrolithiasis. Unremarkable course of the right ureter. Left: No hydronephrosis. Symmetric perfusion to the right. No nephrolithiasis. Unremarkable course of the left ureter. Unremarkable appearance of the urinary bladder . Stomach/Bowel: Unremarkable appearance of the stomach. Unremarkable appearance of small bowel. No evidence of obstruction. Colonic diverticula. No associated inflammatory changes. Normal appendix. Lymphatic: Surgical changes in the region of the prostate, similar to prior. Since the prior CT there has been ill-defined soft tissue development associate with the left pelvic side wall in the region of the surgical clips/nodal dissection. This soft tissue is inseparable from the left-sided external iliac venous system. Interval development of lymph nodes adjacent to the left common iliac vein with short axis diameter measuring 1.4 cm. Additional new lymph nodes in the highest nodal stations of the left iliac nodes. Multiple borderline enlarged inguinal lymph nodes. Mesenteric: No free fluid or air. No adenopathy. Reproductive: Unremarkable appearance of the pelvic organs. Other: No hernia. Musculoskeletal: No displaced fracture. Degenerative changes of the bilateral hips. Degenerative changes of the spine. IMPRESSION: No acute finding identified. Interval development of ill-defined soft tissue on the left  pelvic sidewall associated with the surgical clips of prior nodal dissection in this patient with a history of prostate carcinoma. This soft tissue is inseparable from the left external iliac vein, potentially compromising the venous flow given the history of left lower extremity swelling and changes on the prior DVT study. Tissue is suspicious for nodal recurrence/progression given the additional left-sided iliac lymph nodes which are new from the prior. Alternatively, these may reflect treatment changes and/or  scarring. Referral for urologic evaluation as well as correlation with lab values recommended. These results were called by telephone at the time of interpretation on 01/05/2016 at 7:35 pm to Dr. Gareth Morgan , who verbally acknowledged these results. Left lower extremity superficial swelling. Bilateral inguinal lymph nodes, non-specific. Aortic atherosclerosis, with associated atherosclerotic changes of the bilateral iliac system, femoral popliteal system, and proximal tibial vessels. Vascular calcifications at the origin of the mesenteric vessels and renal vessels. Dense calcifications of the aortic valve, incompletely imaged. Signed, Dulcy Fanny. Earleen Newport, DO [facility] Radiology Electronically Signed   By: Corrie Mckusick D.O.   On: 01/05/2016 19:41   US Venous Img Lower Unilateral Left  Result Date: 01/05/2016 CLINICAL DATA:  82 year old male with a history of left ankle swelling EXAM: LEFT LOWER EXTREMITY VENOUS DOPPLER ULTRASOUND TECHNIQUE: Gray-scale sonography with graded compression, as well as color Doppler and duplex ultrasound were performed to evaluate the lower extremity deep venous systems from the level of the common femoral vein and including the common femoral, femoral, profunda femoral, popliteal and calf veins including the posterior tibial, peroneal and gastrocnemius veins when visible. The superficial great saphenous vein was also interrogated. Spectral Doppler was utilized to evaluate flow at rest and with distal augmentation maneuvers in the common femoral, femoral and popliteal veins. COMPARISON:  None. FINDINGS: Contralateral Common Femoral Vein: Respiratory phasicity is normal and symmetric with the symptomatic side. No evidence of thrombus. Normal compressibility. Common Femoral Vein: No thrombus. Compressible vein. Blunted phasicity compared to the contralateral. Saphenofemoral Junction: No evidence of thrombus. Normal compressibility and  flow on color Doppler imaging. Profunda Femoral Vein: No evidence of thrombus. Normal compressibility and flow on color Doppler imaging. Femoral Vein: No evidence of thrombus. Normal compressibility, respiratory phasicity and response to augmentation. Popliteal Vein: No evidence of thrombus. Normal compressibility, respiratory phasicity and response to augmentation. Calf Veins: No evidence of thrombus. Normal compressibility and flow on color Doppler imaging. Superficial Great Saphenous Vein: No evidence of thrombus. Normal compressibility and flow on color Doppler imaging. Other Findings: Anechoic cystic lesion in the popliteal fossa measuring as large as 4.4 cm, without internal flow. Edema of the superficial tissues. IMPRESSION: Sonographic survey of the left lower extremity negative for DVT. Phasicity of the left common femoral vein is blunted compared to the right, suggesting the possibility of more proximal stenosis or occlusion. If there is concern for more central obstruction, cross-sectional imaging may be considered. Left sided Baker's cyst. Edema of the left lower extremity. Signed, Dulcy Fanny. Earleen Newport, DO [facility] Radiology Electronically Signed   By: Corrie Mckusick D.O.   On: 01/05/2016 17:07   Ct Angio Abd/pel W/ And/or W/o  Result Date: 01/05/2016 CLINICAL DATA:  82 year old male with a history of left lower extremity swelling. Prior duplex demonstrates possible iliac occlusion or stenosis. EXAM: CTA ABDOMEN PELVIS WITH RUNOFF LOWER EXTREMITY WITH CONTRAST TECHNIQUE: Multidetector CT imaging of the abdomen and pelvis with runoff of the bilateral lower extremity was performed using the standard  protocol during bolus administration of intravenous contrast. Multiplanar reconstructed images and MIPs were obtained and reviewed to evaluate the vascular anatomy. CONTRAST:  125mL ISOVUE-300 IOPAMIDOL (ISOVUE-300) INJECTION 61% COMPARISON:  CT 10/11/2010  FINDINGS: VASCULAR Aorta: Atherosclerotic changes of the abdominal aorta. No aneurysm or dissection flap. No periaortic fluid. Dense calcifications of the aortic valve. Celiac: Significant atherosclerotic changes at the origin of the celiac artery which appears to remain patent. SMA: Significant atherosclerotic changes at the origin of the superior mesenteric artery which appears remain patent. Renals: Atherosclerotic changes at the origin the bilateral renal arteries. Symmetric perfusion bilaterally. IMA: Small caliber are inferior mesenteric artery, with patency difficult to assess. Right lower extremity: Unremarkable course, caliber, and contour of the right iliac system. No aneurysm, dissection, or occlusion. Hypogastric artery is patent. Anterior and posterior division patent. Common femoral artery patent. Proximal SFA and profunda femoris patent. Atherosclerotic changes. Timing of the contrast bolus limits evaluation of the arterial system. Atherosclerotic changes present within the femoral popliteal and proximal tibial vessels. Left lower extremity: Unremarkable course, caliber, and contour of the left iliac system. No aneurysm, dissection, or occlusion. Hypogastric artery is patent. Anterior and posterior division patent. Common femoral artery patent. Proximal SFA and profunda femoris patent. Atherosclerotic changes. Timing of the contrast bolus limits evaluation of the arterial system. Atherosclerotic changes are present of the femoral popliteal system and proximal tibial vasculature. Superficial soft tissue swelling of the left lower extremity, asymmetric when compared to the right. Veins: Portal system unremarkable.  Superior mesenteric vein patent. Suboptimal opacification of the IVC in the iliac system. On the right there is not appear to be external compression of the right iliac system. On the left, there are surgical changes at the left pelvic sidewall which were present on the comparison CT adjacent  to the vein. There is ill-defined soft tissue which has developed in the interval, asymmetric to the right in the region of the surgical clips. Ill-defined soft tissue inseparable from the left external iliac venous system. Review of the MIP images confirms the above findings. NON-VASCULAR Lower chest: Scarring/ atelectasis of the bilateral lower lobes. Hiatal hernia. Hepatobiliary: Unremarkable appearance of the liver. Cholecystectomy. Pancreas: Unremarkable appearance of the pancreas. No pericholecystic fluid or inflammatory changes. Unremarkable ductal system. Spleen: Unremarkable. Adrenals/Urinary Tract: Unremarkable appearance of adrenal glands. Right: No hydronephrosis. Symmetric perfusion to the left. No nephrolithiasis. Unremarkable course of the right ureter. Left: No hydronephrosis. Symmetric perfusion to the right. No nephrolithiasis. Unremarkable course of the left ureter. Unremarkable appearance of the urinary bladder . Stomach/Bowel: Unremarkable appearance of the stomach. Unremarkable appearance of small bowel. No evidence of obstruction. Colonic diverticula. No associated inflammatory changes. Normal appendix. Lymphatic: Surgical changes in the region of the prostate, similar to prior. Since the prior CT there has been ill-defined soft tissue development associate with the left pelvic side wall in the region of the surgical clips/nodal dissection. This soft tissue is inseparable from the left-sided external iliac venous system. Interval development of lymph nodes adjacent to the left common iliac vein with short axis diameter measuring 1.4 cm. Additional new lymph nodes in the highest nodal stations of the left iliac nodes. Multiple borderline enlarged inguinal lymph nodes. Mesenteric: No free fluid or air. No adenopathy. Reproductive: Unremarkable appearance of the pelvic organs. Other: No hernia. Musculoskeletal: No displaced fracture. Degenerative changes of the bilateral hips. Degenerative  changes of the spine. IMPRESSION: No acute finding identified. Interval development of ill-defined soft tissue on the left pelvic sidewall associated with the  surgical clips of prior nodal dissection in this patient with a history of prostate carcinoma. This soft tissue is inseparable from the left external iliac vein, potentially compromising the venous flow given the history of left lower extremity swelling and changes on the prior DVT study. Tissue is suspicious for nodal recurrence/progression given the additional left-sided iliac lymph nodes which are new from the prior. Alternatively, these may reflect treatment changes and/or scarring. Referral for urologic evaluation as well as correlation with lab values recommended. These results were called by telephone at the time of interpretation on 01/05/2016 at 7:35 pm to Dr. Gareth Morgan , who verbally acknowledged these results. Left lower extremity superficial swelling. Bilateral inguinal lymph nodes, non-specific. Aortic atherosclerosis, with associated atherosclerotic changes of the bilateral iliac system, femoral popliteal system, and proximal tibial vessels. Vascular calcifications at the origin of the mesenteric vessels and renal vessels. Dense calcifications of the aortic valve, incompletely imaged. Signed, Dulcy Fanny. Earleen Newport, DO [facility] Electronically Signed   By: Corrie Mckusick D.O.   On: 01/05/2016 19:41    Procedures Procedures (including critical care time)  Medications Ordered in ED Medications  iopamidol (ISOVUE-300) 61 % injection 100 mL (100 mLs Intravenous Contrast Given 01/05/16 1823)     Initial Impression / Assessment and Plan / ED Course  I have reviewed the triage vital signs and the nursing notes.  Pertinent labs & imaging results that were available during my care of the patient were reviewed by me and considered in my medical decision making (see chart for  details).  Clinical Course as of Jan 06 1227  Sat Jan 05, 2016  1606 US Venous Img Lower Unilateral Left [ES]    Clinical Course User Index [ES] Gareth Morgan, MD   [phone] male presents with concern for left lower extremity edema. DVT US negative for DVT but shows slowed femoral vein flow.  CT venogram completed to evaluate for more proximal obstruction and shows soft tissue compression of external iliac vein, possibly secondary to prostate cancer recurrence or scar tissue.  Recommend close follow up with PCP and urology.  Patient discharged in stable condition with understanding of reasons to return.    Final Clinical Impressions(s) / ED Diagnoses   Final diagnoses:  Swelling of left lower extremity caused by compression of left external iliac vein   I personally performed the services described in this documentation, which was scribed in my presence. The recorded information has been reviewed and is accurate.   New Prescriptions Discharge Medication List as of 01/05/2016  7:55 PM       Gareth Morgan, MD 01/06/16 1236

## 2016-01-05 NOTE — ED Notes (Signed)
Pt is in good condition, discharge instructions reviewed, follow up care reviewed, pt verbalized understanding; pt is ambulatory and went home with family

## 2016-01-05 NOTE — ED Triage Notes (Signed)
Pt in c/o L leg swelling x 3 weeks. PCP suspicious of clot. Pt takes Coumadin. Pt alert, interactive, ambulatory, in NAD.

## 2016-01-07 ENCOUNTER — Other Ambulatory Visit (HOSPITAL_COMMUNITY): Payer: Self-pay | Admitting: Internal Medicine

## 2016-01-07 DIAGNOSIS — R609 Edema, unspecified: Secondary | ICD-10-CM

## 2016-01-08 ENCOUNTER — Encounter (HOSPITAL_COMMUNITY): Payer: Medicare Other

## 2016-01-17 DIAGNOSIS — C61 Malignant neoplasm of prostate: Secondary | ICD-10-CM | POA: Diagnosis not present

## 2016-01-23 ENCOUNTER — Encounter: Payer: Self-pay | Admitting: Vascular Surgery

## 2016-01-28 ENCOUNTER — Other Ambulatory Visit: Payer: Self-pay | Admitting: Interventional Cardiology

## 2016-01-30 ENCOUNTER — Encounter: Payer: Self-pay | Admitting: Vascular Surgery

## 2016-01-30 ENCOUNTER — Ambulatory Visit (INDEPENDENT_AMBULATORY_CARE_PROVIDER_SITE_OTHER): Payer: Medicare Other | Admitting: Vascular Surgery

## 2016-01-30 VITALS — BP 167/86 | HR 114 | Temp 97.5°F | Resp 18 | Ht 69.0 in | Wt 183.0 lb

## 2016-01-30 DIAGNOSIS — M7989 Other specified soft tissue disorders: Secondary | ICD-10-CM | POA: Diagnosis not present

## 2016-01-30 DIAGNOSIS — I2510 Atherosclerotic heart disease of native coronary artery without angina pectoris: Secondary | ICD-10-CM

## 2016-01-30 NOTE — Progress Notes (Signed)
[facility] of [place]  Patient name: Robert [name] MRN: [id number] DOB: 1927/11/19 Sex: male  REASON FOR CONSULT: Left leg swelling  HPI: Robert [name] is a 82 y.o. male, who is day for swelling of his left leg. He is a very pleasant gentleman who is here today with his wife. He has a prior history of prostate cancer with surgical resection 20 years ago. He reports that in August he began to notice mild swelling in his left leg. This became more progressive in September the brought to the attention of Dr. Inda Merlin. He underwent [facility] [place] on 01/05/2016. He underwent a lower from the duplex which showed no evidence of DVT but did show some potential proximal occlusion of the vein. He underwent CT venogram at the same time which showed a new mass around the area of the left pelvic sidewall area of prior surgical clips with a probable recurrent prostate cancer. This does appear to cause some compression of the vein as well. Incidentally the patient reports that his PSA had been relatively normal and had risen dramatically recently. He does have a appointment to see Dr. Rosana Hoes within the next 1-2 weeks for urologic evaluation as well  Past Medical History:  Diagnosis Date  . Aortic stenosis    a. s/p tissue AVR 05/2013;  b. Echo (06/2013):  Mod LVH, EF 60-65%, no RWMA, Gr 2 DD, AVR ok (mean 13 mmHg), MAC, mild MR, mod LAE, mild RAE, PASP 46 mmHg (mild pulmo HTN)  . Blood loss anemia    a. 05/2009 a/w GIB.  [name] CAD (coronary artery disease)    a. Cath 04/2009: BMS to LCx 04/2009; CTO of RCA with L-R collaterals, 40% ostial diag.  . Carpal tunnel syndrome    bilateral, carpal tunnel release x 2  . Esophageal ulcer    a. 05/2009 with hemorrhage - injected with epinephrine-Dr. Herbie Baltimore Buccini.  . Esophagitis    a. 05/2009 a/w GIB.  [name] GERD (gastroesophageal reflux disease)    EGD, Dr. Toney Rakes 2008  . GI bleed    a. 05/2009 as above: with associated anemia. Lower esophageal ulcer with extensive erosive esophagitis and large clot by EGD 05/2009.   [name] Hx of colonoscopy 2008   Dr. Toney Rakes, polyps- 5 yr f/u recommended  . Hyperlipidemia   . Hypertension   . PAF (paroxysmal atrial fibrillation) ([place])    a. Post-op AVR;  b. 04/2014 recurrent.  . Perforated ear drum    right ear drum, tube in left ear drum, Dr. Constance Holster  . Polymyalgia rheumatica ([place])   . Prostate cancer ([place])   . Vitamin D deficiency     Family History  Problem Relation Age of Onset  . CVA Mother   . Kidney disease Father   . Breast cancer Daughter     SOCIAL HISTORY: Social History   Social History  . Marital status: Married    Spouse name: N/A  . Number of children: N/A  . Years of education: N/A   Occupational History  . Not on file.   Social History Main Topics  . Smoking status: Never Smoker  . Smokeless tobacco: Never Used  . Alcohol use No  . Drug use: No  . Sexual activity: Not on file   Other Topics Concern  . Not on file   Social History Narrative  . No narrative on file    Allergies  Allergen Reactions  . Lisinopril Cough and Other (See  Comments)  . Lasix [Furosemide] Other (See Comments)    Pt is unaware of reaction  . Kenalog [Triamcinolone Acetonide] Rash    Current Outpatient Prescriptions  Medication Sig Dispense Refill  . Ascorbic Acid (VITAMIN C) 100 MG tablet Take 100 mg by mouth daily.    . cholecalciferol (VITAMIN D) 1000 UNITS tablet Take 1,000 Units by mouth daily.    [name] losartan (COZAAR) 25 MG tablet Take 25 mg by mouth daily.    . metoprolol succinate (TOPROL-XL) 50 MG 24 hr tablet TAKE 1 TABLET TWICE A DAY 180 tablet 2  . Omega-3 Fatty Acids (FISH OIL) 1000 MG CAPS Take 2 capsules by mouth daily.    [name] omeprazole (PRILOSEC) 20 MG capsule Take 20 mg by mouth daily.    . simvastatin (ZOCOR) 40 MG tablet Take 40 mg by mouth daily.    . TURMERIC PO Take 1 tablet by mouth daily.      [name] warfarin (COUMADIN) 5 MG tablet TAKE AS DIRECTED BY COUMADIN CLINIC 100 tablet 1   No current facility-administered medications for this visit.     REVIEW OF SYSTEMS:  [X]  denotes positive finding, [ ]  denotes negative finding Cardiac  Comments:  Chest pain or chest pressure:    Shortness of breath upon exertion:    Short of breath when lying flat:    Irregular heart rhythm: x       Vascular    Pain in calf, thigh, or hip brought on by ambulation:    Pain in feet at night that wakes you up from your sleep:     Blood clot in your veins:    Leg swelling:  x       Pulmonary    Oxygen at home:    Productive cough:     Wheezing:         Neurologic    Sudden weakness in arms or legs:     Sudden numbness in arms or legs:     Sudden onset of difficulty speaking or slurred speech:    Temporary loss of vision in one eye:     Problems with dizziness:         Gastrointestinal    Blood in stool:     Vomited blood:         Genitourinary    Burning when urinating:     Blood in urine:        Psychiatric    Major depression:         Hematologic    Bleeding problems:    Problems with blood clotting too easily:        Skin    Rashes or ulcers:        Constitutional    Fever or chills:      PHYSICAL EXAM: Vitals:   01/30/16 0858 01/30/16 0903  BP: (!) 153/82 (!) 167/86  Pulse: (!) 114 (!) 114  Resp: 18   Temp: 97.5 F (36.4 C)   SpO2: 98%   Weight: 183 lb (83 kg)   Height: 5\' 9"  (1.753 m)     GENERAL: The patient is a well-nourished male, in no acute distress. The vital signs are documented above. CARDIOVASCULAR: Marked swelling in the entire left leg from proximal thigh down past his ankle. I do not palpate pedal pulses on the left. He has easily palpable dorsalis pedis pulse on the right. He does have the incision at his medial knee from his vein harvest. Pitting edema present  left leg Carotid arteries without bruits bilaterally PULMONARY: There is good air  exchange  ABDOMEN: Soft and non-tender  MUSCULOSKELETAL: There are no major deformities or cyanosis. NEUROLOGIC: No focal weakness or paresthesias are detected. SKIN: There are no ulcers or rashes noted. PSYCHIATRIC: The patient has a normal affect.  DATA:  Reviewed his duplex and reviewed his actual films. This does show compression of the vein in this area of fullness.  MEDICAL ISSUES: Long discussion with the patient and his wife. This may represent swelling from venous compression but feels more likely that this is lymphedema from lymphatic recurrence in his pelvis from state cancer. Explained that there is a great deal of overlap in symptoms his findings with venous and lymphatic congestion. He did defer any other evaluation until it is determined what treatment will be required for his control of recurrent prostate cancer. Explained that he may be a candidate for venous stenting to improve any venous component of this. See him again in 2 months for further discussion after he has had time for urologic evaluation. He is wearing thigh high compression garment is been quite compliant with this and will continue this and elevation when possible   Rosetta Posner, MD [facility] of [facility] Tel [phone] Pager [phone]

## 2016-01-30 NOTE — Progress Notes (Signed)
Vitals:   01/30/16 0858  BP: (!) 153/82  Pulse: (!) 114  Resp: 18  Temp: 97.5 F (36.4 C)  SpO2: 98%  Weight: 183 lb (83 kg)  Height: 5\' 9"  (1.753 m)

## 2016-02-04 DIAGNOSIS — R972 Elevated prostate specific antigen [PSA]: Secondary | ICD-10-CM | POA: Diagnosis not present

## 2016-02-04 DIAGNOSIS — C61 Malignant neoplasm of prostate: Secondary | ICD-10-CM | POA: Diagnosis not present

## 2016-02-04 DIAGNOSIS — R590 Localized enlarged lymph nodes: Secondary | ICD-10-CM | POA: Diagnosis not present

## 2016-02-07 ENCOUNTER — Ambulatory Visit (INDEPENDENT_AMBULATORY_CARE_PROVIDER_SITE_OTHER): Payer: Medicare Other | Admitting: *Deleted

## 2016-02-07 DIAGNOSIS — I4891 Unspecified atrial fibrillation: Secondary | ICD-10-CM | POA: Diagnosis not present

## 2016-02-07 DIAGNOSIS — I2510 Atherosclerotic heart disease of native coronary artery without angina pectoris: Secondary | ICD-10-CM

## 2016-02-07 DIAGNOSIS — Z5181 Encounter for therapeutic drug level monitoring: Secondary | ICD-10-CM

## 2016-02-07 LAB — POCT INR: INR: 2.2

## 2016-02-14 DIAGNOSIS — Z79899 Other long term (current) drug therapy: Secondary | ICD-10-CM | POA: Diagnosis not present

## 2016-02-14 DIAGNOSIS — Z7901 Long term (current) use of anticoagulants: Secondary | ICD-10-CM | POA: Diagnosis not present

## 2016-02-14 DIAGNOSIS — R972 Elevated prostate specific antigen [PSA]: Secondary | ICD-10-CM | POA: Diagnosis not present

## 2016-02-14 DIAGNOSIS — R600 Localized edema: Secondary | ICD-10-CM | POA: Diagnosis not present

## 2016-02-14 DIAGNOSIS — I10 Essential (primary) hypertension: Secondary | ICD-10-CM | POA: Diagnosis not present

## 2016-02-14 DIAGNOSIS — Z951 Presence of aortocoronary bypass graft: Secondary | ICD-10-CM | POA: Diagnosis not present

## 2016-02-14 DIAGNOSIS — R590 Localized enlarged lymph nodes: Secondary | ICD-10-CM | POA: Diagnosis not present

## 2016-02-14 DIAGNOSIS — C61 Malignant neoplasm of prostate: Secondary | ICD-10-CM | POA: Diagnosis not present

## 2016-02-14 DIAGNOSIS — Z888 Allergy status to other drugs, medicaments and biological substances status: Secondary | ICD-10-CM | POA: Diagnosis not present

## 2016-02-21 ENCOUNTER — Ambulatory Visit (INDEPENDENT_AMBULATORY_CARE_PROVIDER_SITE_OTHER): Payer: Medicare Other | Admitting: *Deleted

## 2016-02-21 DIAGNOSIS — I4891 Unspecified atrial fibrillation: Secondary | ICD-10-CM | POA: Diagnosis not present

## 2016-02-21 DIAGNOSIS — Z5181 Encounter for therapeutic drug level monitoring: Secondary | ICD-10-CM | POA: Diagnosis not present

## 2016-02-21 DIAGNOSIS — I2510 Atherosclerotic heart disease of native coronary artery without angina pectoris: Secondary | ICD-10-CM

## 2016-02-21 LAB — POCT INR: INR: 3.3

## 2016-02-26 ENCOUNTER — Other Ambulatory Visit (HOSPITAL_COMMUNITY): Payer: Self-pay | Admitting: Hematology and Oncology

## 2016-02-26 DIAGNOSIS — M7989 Other specified soft tissue disorders: Secondary | ICD-10-CM

## 2016-02-26 DIAGNOSIS — C61 Malignant neoplasm of prostate: Secondary | ICD-10-CM

## 2016-02-27 DIAGNOSIS — C61 Malignant neoplasm of prostate: Secondary | ICD-10-CM | POA: Diagnosis not present

## 2016-02-27 DIAGNOSIS — R590 Localized enlarged lymph nodes: Secondary | ICD-10-CM | POA: Diagnosis not present

## 2016-02-27 DIAGNOSIS — R972 Elevated prostate specific antigen [PSA]: Secondary | ICD-10-CM | POA: Diagnosis not present

## 2016-02-27 DIAGNOSIS — K449 Diaphragmatic hernia without obstruction or gangrene: Secondary | ICD-10-CM | POA: Diagnosis not present

## 2016-02-29 DIAGNOSIS — Z1389 Encounter for screening for other disorder: Secondary | ICD-10-CM | POA: Diagnosis not present

## 2016-02-29 DIAGNOSIS — C61 Malignant neoplasm of prostate: Secondary | ICD-10-CM | POA: Diagnosis not present

## 2016-02-29 DIAGNOSIS — Z952 Presence of prosthetic heart valve: Secondary | ICD-10-CM | POA: Diagnosis not present

## 2016-02-29 DIAGNOSIS — I890 Lymphedema, not elsewhere classified: Secondary | ICD-10-CM | POA: Diagnosis not present

## 2016-02-29 DIAGNOSIS — L820 Inflamed seborrheic keratosis: Secondary | ICD-10-CM | POA: Diagnosis not present

## 2016-02-29 DIAGNOSIS — Z0000 Encounter for general adult medical examination without abnormal findings: Secondary | ICD-10-CM | POA: Diagnosis not present

## 2016-02-29 DIAGNOSIS — K210 Gastro-esophageal reflux disease with esophagitis: Secondary | ICD-10-CM | POA: Diagnosis not present

## 2016-02-29 DIAGNOSIS — I2510 Atherosclerotic heart disease of native coronary artery without angina pectoris: Secondary | ICD-10-CM | POA: Diagnosis not present

## 2016-02-29 DIAGNOSIS — E785 Hyperlipidemia, unspecified: Secondary | ICD-10-CM | POA: Diagnosis not present

## 2016-02-29 DIAGNOSIS — I10 Essential (primary) hypertension: Secondary | ICD-10-CM | POA: Diagnosis not present

## 2016-02-29 DIAGNOSIS — Z79899 Other long term (current) drug therapy: Secondary | ICD-10-CM | POA: Diagnosis not present

## 2016-03-10 ENCOUNTER — Ambulatory Visit (INDEPENDENT_AMBULATORY_CARE_PROVIDER_SITE_OTHER): Payer: Medicare Other | Admitting: *Deleted

## 2016-03-10 DIAGNOSIS — I4891 Unspecified atrial fibrillation: Secondary | ICD-10-CM

## 2016-03-10 DIAGNOSIS — Z5181 Encounter for therapeutic drug level monitoring: Secondary | ICD-10-CM | POA: Diagnosis not present

## 2016-03-10 LAB — POCT INR: INR: 1.7

## 2016-03-18 DIAGNOSIS — R05 Cough: Secondary | ICD-10-CM | POA: Diagnosis not present

## 2016-03-18 DIAGNOSIS — J069 Acute upper respiratory infection, unspecified: Secondary | ICD-10-CM | POA: Diagnosis not present

## 2016-03-27 DIAGNOSIS — R590 Localized enlarged lymph nodes: Secondary | ICD-10-CM | POA: Diagnosis not present

## 2016-03-27 DIAGNOSIS — R972 Elevated prostate specific antigen [PSA]: Secondary | ICD-10-CM | POA: Diagnosis not present

## 2016-03-27 DIAGNOSIS — C61 Malignant neoplasm of prostate: Secondary | ICD-10-CM | POA: Diagnosis not present

## 2016-03-29 DIAGNOSIS — J0100 Acute maxillary sinusitis, unspecified: Secondary | ICD-10-CM | POA: Diagnosis not present

## 2016-03-31 NOTE — Progress Notes (Signed)
Patient ID: DIETRICK BARRIS, male   DOB: 1927-09-26, 83 y.o.   MRN: [id number]     Cardiology Office Note   Date:  04/01/2016   ID:  GABRIAL DOMINE, DOB 01/10/1928, MRN [id number]  PCP:  Henrine Screws, MD    No chief complaint on file. f/u CAD   Wt Readings from Last 3 Encounters:  04/01/16 176 lb (79.8 kg)  01/30/16 183 lb (83 kg)  01/05/16 185 lb (83.9 kg)       History of Present Illness: ASIA FAVATA is a 83 y.o. male  who had a BMS to the circumflex in 2011. He had AFib in 2/15. Workup revealed severe AS. He had AVR with single vessel CABG, Maze procedure and left atrial clip in 2015.Later in 2015, He had presyncope and his HR was in the 40s. Resolved with stopping beta blocker and digoxin. He did well for a while.   In 2/16, he had palpitations and went to the ER. He was in atrial flutter 2:1 block. He was sent home. He was started on metoprolol and Eliquis in the clinic the next day. Rhythm converted.    No CP, SHOB, swelling recently.  Eliquis was changed to COumadin due to cost reasons.  He is tolerating this well. He has not acquired many dose adjustments. He has not felt any further fast heart rates. He has not had lightheadedness that was associated with his bradycardia. He has tolerated metoprolol well.  BP and HR are well controlled at home when he checks.  Typically in the 902 systolic range, HR in the 65 range.  No bleeding troubles.  INR increased with antibiotics.  Walking is difficult due to chronic foot pain- gets around ok.  He remains active in the yard.  He does some light weights/dumbbells and elastic band work.    Since last visit:  Chronic left ankle swelling- then progressed to the whole left leg in 2017, vein removed for CABG. It  Was caused by lymph node blocking vein flow. Initally diagnosed with PrCA [phone]. Recent check showed PSA was elevated and has reduced with Lupron.  Swelling is nearly completely resolved after  Lupron.  He had a w/u for DVT which was negative.  Following with Dr. Rosana Hoes for his prostate cancer.  BP at home is typically in the 130/60 range.  No sx of bradycardia.    Recent cold.  Taking antibiotic.    Past Medical History:  Diagnosis Date  . Aortic stenosis    a. s/p tissue AVR 05/2013;  b. Echo (06/2013):  Mod LVH, EF 60-65%, no RWMA, Gr 2 DD, AVR ok (mean 13 mmHg), MAC, mild MR, mod LAE, mild RAE, PASP 46 mmHg (mild pulmo HTN)  . Blood loss anemia    a. 05/2009 a/w GIB.  [name] CAD (coronary artery disease)    a. Cath 04/2009: BMS to LCx 04/2009; CTO of RCA with L-R collaterals, 40% ostial diag.  . Carpal tunnel syndrome    bilateral, carpal tunnel release x 2  . Esophageal ulcer    a. 05/2009 with hemorrhage - injected with epinephrine-Dr. Herbie Baltimore Buccini.  . Esophagitis    a. 05/2009 a/w GIB.  [name] GERD (gastroesophageal reflux disease)    EGD, Dr. Toney Rakes 2008  . GI bleed    a. 05/2009 as above: with associated anemia. Lower esophageal ulcer with extensive erosive esophagitis and large clot by EGD 05/2009.   [name] Hx of colonoscopy 2008   Dr. Toney Rakes, polyps- 5 yr  f/u recommended  . Hyperlipidemia   . Hypertension   . PAF (paroxysmal atrial fibrillation) ([place])    a. Post-op AVR;  b. 04/2014 recurrent.  . Perforated ear drum    right ear drum, tube in left ear drum, Dr. Constance Holster  . Polymyalgia rheumatica ([place])   . Prostate cancer ([place])   . Vitamin D deficiency     Past Surgical History:  Procedure Laterality Date  . AORTIC VALVE REPLACEMENT N/A 05/12/2013   Procedure: AORTIC VALVE REPLACEMENT (AVR);  Surgeon: Ivin Poot, MD;  Location: [place];  Service: Open Heart Surgery;  Laterality: N/A;  . CARDIAC CATHETERIZATION  2011  . CORONARY ARTERY BYPASS GRAFT N/A 05/12/2013   Procedure: CORONARY ARTERY BYPASS GRAFTING (CABG);  Surgeon: Ivin Poot, MD;  Location: [place];  Service: Open Heart Surgery;  Laterality: N/A;  CABG x 1 using left leg greater saphenous vein  harvested endoscopically  . INTRAOPERATIVE TRANSESOPHAGEAL ECHOCARDIOGRAM N/A 05/12/2013   Procedure: INTRAOPERATIVE TRANSESOPHAGEAL ECHOCARDIOGRAM;  Surgeon: Ivin Poot, MD;  Location: [place];  Service: Open Heart Surgery;  Laterality: N/A;  . LEFT AND RIGHT HEART CATHETERIZATION WITH CORONARY ANGIOGRAM N/A 05/09/2013   Procedure: LEFT AND RIGHT HEART CATHETERIZATION WITH CORONARY ANGIOGRAM;  Surgeon: Lorretta Harp, MD;  Location[facility] C[facility] LAB;  Service: Cardiovascular;  Laterality: N/A;  . MAZE N/A 05/12/2013   Procedure: MAZE;  Surgeon: Ivin Poot, MD;  Location: [place];  Service: Open Heart Surgery;  Laterality: N/A;  . PROSTATECTOMY       Current Outpatient Prescriptions  Medication Sig Dispense Refill  . Ascorbic Acid (VITAMIN C) 100 MG tablet Take 100 mg by mouth daily.    . cholecalciferol (VITAMIN D) 1000 UNITS tablet Take 1,000 Units by mouth daily.    [name] losartan (COZAAR) 25 MG tablet Take 25 mg by mouth daily.    . metoprolol succinate (TOPROL-XL) 50 MG 24 hr tablet TAKE 1 TABLET TWICE A DAY 180 tablet 2  . Omega-3 Fatty Acids (FISH OIL) 1000 MG CAPS Take 2 capsules by mouth daily.    [name] omeprazole (PRILOSEC) 20 MG capsule Take 20 mg by mouth daily.    . simvastatin (ZOCOR) 40 MG tablet Take 40 mg by mouth daily.    . TURMERIC PO Take 1 tablet by mouth daily.    [name] warfarin (COUMADIN) 5 MG tablet TAKE AS DIRECTED BY COUMADIN CLINIC 100 tablet 1   No current facility-administered medications for this visit.     Allergies:   Lisinopril; Lasix [furosemide]; and Kenalog [triamcinolone acetonide]    Social History:  The patient  reports that he has never smoked. He has never used smokeless tobacco. He reports that he does not drink alcohol or use drugs.   Family History:  The patient's family history includes Breast cancer in his daughter; CVA in his mother; Kidney disease in his father.    ROS:  Please see the history of present illness.   Otherwise, review of systems are  positive for joint pains.   All other systems are reviewed and negative.    PHYSICAL EXAM: VS:  BP (!) 160/88 (BP Location: Left Arm, Patient Position: Sitting, Cuff Size: Normal)   Pulse 72   Ht _0  (1.753 m)   Wt 176 lb (79.8 kg)   BMI 25.99 kg/m  , BMI Body mass index is 25.99 kg/m. GEN: Well nourished, well developed, in no acute distress  HEENT: normal  Neck: no JVD, carotid bruits, or masses  Cardiac: RRR; soft systolic murmurs, rubs, or gallops,; mild left leg edema  Respiratory:  clear to auscultation bilaterally, normal work of breathing GI: soft, nontender, nondistended, + BS MS: no deformity or atrophy  Skin: warm and dry, no rash Neuro:  Strength and sensation are intact Psych: euthymic mood, full affect   ECG: NSR, nonspecific ST segment changes  Recent Labs: 01/05/2016: BUN 10; Creatinine, Ser 0.67; Hemoglobin 13.1; Platelets 134; Potassium 3.8; Sodium 139   Lipid Panel No results found for: CHOL, TRIG, HDL, CHOLHDL, VLDL, LDLCALC, LDLDIRECT   Other studies Reviewed: Additional studies/ records that were reviewed today with results demonstrating: 2/16 ECG showed Atrial flutter.   ASSESSMENT AND PLAN:  1. Left leg swelling.  Was caused by lymph node blocking vein flow.  PSA was elevated and has reduced with Lupron.  Swelling is nearly completely resolved.  He had a w/u for DVT which was negative.  Following with Dr. Rosana Hoes for his prostate cancer.  Elevate left leg when possible. 2. CAD: Status post single-vessel bypass to the RCA and stent placement in the circumflex several years ago. No angina. Continue aggressive secondary prevention.  COntinue simvastatin. Refill metoprolol.  3. AVR: Appears to be functioning well. He needs SBE prophylaxis for dental visits- false teeth. 4. AFlutter: Coumadin for stroke prevention.  INR checks about every 6 weeks- except for when he is taking an antibiotic as he currently is doing.  He prefers this to the Eliquis. He is  maintaining sinus rhythm at this point. Continue metoprolol. Bradycardia has not been an issue. It may have been more the digoxin that was causing bradycardia in the past in 2015.  No bleeding problems. 5. HTN: at home (see above), well controlled. Higher today. Continue current medicines. 6. INR 2.9 today. Coumadin adjusted while on antibiotic.   Current medicines are reviewed at length with the patient today.  The patient concerns regarding his medicines were addressed.  The following changes have been made:  No change  Labs/ tests ordered today include: No orders of the defined types were placed in this encounter.   Recommend 150 minutes/week of aerobic exercise Low fat, low carb, high fiber diet recommended  Disposition:   FU in 12 months   Signed, Larae Grooms, MD  04/01/2016 8:34 AM    [facility] [address], [place], [place]  [address] Phone: [phone]; Fax: [phone]

## 2016-04-01 ENCOUNTER — Encounter (INDEPENDENT_AMBULATORY_CARE_PROVIDER_SITE_OTHER): Payer: Self-pay

## 2016-04-01 ENCOUNTER — Ambulatory Visit: Payer: Medicare Other | Admitting: Vascular Surgery

## 2016-04-01 ENCOUNTER — Telehealth: Payer: Self-pay | Admitting: Interventional Cardiology

## 2016-04-01 ENCOUNTER — Ambulatory Visit (INDEPENDENT_AMBULATORY_CARE_PROVIDER_SITE_OTHER): Payer: Medicare Other | Admitting: *Deleted

## 2016-04-01 ENCOUNTER — Ambulatory Visit (INDEPENDENT_AMBULATORY_CARE_PROVIDER_SITE_OTHER): Payer: Medicare Other | Admitting: Interventional Cardiology

## 2016-04-01 ENCOUNTER — Encounter: Payer: Self-pay | Admitting: Interventional Cardiology

## 2016-04-01 VITALS — BP 160/88 | HR 72 | Ht 69.0 in | Wt 176.0 lb

## 2016-04-01 DIAGNOSIS — I4891 Unspecified atrial fibrillation: Secondary | ICD-10-CM | POA: Diagnosis not present

## 2016-04-01 DIAGNOSIS — Z5181 Encounter for therapeutic drug level monitoring: Secondary | ICD-10-CM

## 2016-04-01 DIAGNOSIS — I2510 Atherosclerotic heart disease of native coronary artery without angina pectoris: Secondary | ICD-10-CM | POA: Diagnosis not present

## 2016-04-01 DIAGNOSIS — Z7901 Long term (current) use of anticoagulants: Secondary | ICD-10-CM

## 2016-04-01 DIAGNOSIS — I10 Essential (primary) hypertension: Secondary | ICD-10-CM

## 2016-04-01 DIAGNOSIS — Z952 Presence of prosthetic heart valve: Secondary | ICD-10-CM

## 2016-04-01 LAB — POCT INR: INR: 2.9

## 2016-04-01 MED ORDER — WARFARIN SODIUM 5 MG PO TABS: ORAL_TABLET | ORAL | 0 refills | Status: DC

## 2016-04-01 NOTE — Telephone Encounter (Signed)
The pt is advised that I have placed his signed prescriptions for his Metoprolol and Coumadin in the mail. He verbalized understanding and thanked me for my help.

## 2016-04-01 NOTE — Patient Instructions (Signed)

## 2016-04-01 NOTE — Telephone Encounter (Signed)
Robert [name] is calling because he is on a new program and needs a prescription for Metoprolol and Coumadin  For a 90 day supply mailed to him . It has to be a written prescription with the new program that he is in . Thanks

## 2016-04-07 ENCOUNTER — Telehealth: Payer: Self-pay | Admitting: Interventional Cardiology

## 2016-04-07 NOTE — Telephone Encounter (Signed)
Mr.  Garges is calling about the Metoprolol .  Has some questions about the instructions . Please call

## 2016-04-07 NOTE — Telephone Encounter (Signed)
They need rx for Toprol 50 mg BID for 90 days w/ 3 refills. (wife said to please make sure it was for 180 tab w/ 3 refills b/c pt takes med BID) Spoke w/ wife (DPR on file).  She asks that we mail them rx because they have to send paper one in for the first time. Informed that I would forward to Dr. Hassell Done nurse, Jeani Hawking, to address when she is back in the office.  Explained that [place] will have to sign this "paper" rx and it would be Thursday/Friday before he returns to the office to sign. Will ask Jeani Hawking call the pt once signed and mailed so they know it is on the way.

## 2016-04-08 ENCOUNTER — Ambulatory Visit (INDEPENDENT_AMBULATORY_CARE_PROVIDER_SITE_OTHER): Payer: Medicare Other | Admitting: *Deleted

## 2016-04-08 DIAGNOSIS — Z5181 Encounter for therapeutic drug level monitoring: Secondary | ICD-10-CM

## 2016-04-08 DIAGNOSIS — I4891 Unspecified atrial fibrillation: Secondary | ICD-10-CM | POA: Diagnosis not present

## 2016-04-08 DIAGNOSIS — I2510 Atherosclerotic heart disease of native coronary artery without angina pectoris: Secondary | ICD-10-CM

## 2016-04-08 LAB — POCT INR: INR: 2

## 2016-04-09 NOTE — Telephone Encounter (Signed)
Robert [name] is returning a call . Thanks

## 2016-04-09 NOTE — Telephone Encounter (Signed)
Left message on the pts VM stating that I mailed his RXs for Coumadin and Metoprolol to his home address last Tuesday 1/30. I left this offices phone number so the pt can call back if he has questions.

## 2016-04-09 NOTE — Telephone Encounter (Signed)
The pt is advised that I will have Dr Irish Lack sign his RXs tomorrow then I will place them in the mail to be delivered to his address. He verbalized understanding.

## 2016-04-24 ENCOUNTER — Ambulatory Visit (INDEPENDENT_AMBULATORY_CARE_PROVIDER_SITE_OTHER): Payer: Medicare Other

## 2016-04-24 DIAGNOSIS — Z5181 Encounter for therapeutic drug level monitoring: Secondary | ICD-10-CM | POA: Diagnosis not present

## 2016-04-24 DIAGNOSIS — I2510 Atherosclerotic heart disease of native coronary artery without angina pectoris: Secondary | ICD-10-CM

## 2016-04-24 DIAGNOSIS — I4891 Unspecified atrial fibrillation: Secondary | ICD-10-CM

## 2016-04-24 LAB — POCT INR: INR: 1.9

## 2016-05-07 DIAGNOSIS — C4442 Squamous cell carcinoma of skin of scalp and neck: Secondary | ICD-10-CM | POA: Diagnosis not present

## 2016-05-07 DIAGNOSIS — D485 Neoplasm of uncertain behavior of skin: Secondary | ICD-10-CM | POA: Diagnosis not present

## 2016-05-07 DIAGNOSIS — C44622 Squamous cell carcinoma of skin of right upper limb, including shoulder: Secondary | ICD-10-CM | POA: Diagnosis not present

## 2016-05-07 DIAGNOSIS — Z85828 Personal history of other malignant neoplasm of skin: Secondary | ICD-10-CM | POA: Diagnosis not present

## 2016-05-07 DIAGNOSIS — L570 Actinic keratosis: Secondary | ICD-10-CM | POA: Diagnosis not present

## 2016-05-09 ENCOUNTER — Other Ambulatory Visit: Payer: Self-pay | Admitting: Interventional Cardiology

## 2016-05-15 ENCOUNTER — Ambulatory Visit (INDEPENDENT_AMBULATORY_CARE_PROVIDER_SITE_OTHER): Payer: Medicare Other | Admitting: *Deleted

## 2016-05-15 DIAGNOSIS — Z5181 Encounter for therapeutic drug level monitoring: Secondary | ICD-10-CM | POA: Diagnosis not present

## 2016-05-15 DIAGNOSIS — I4891 Unspecified atrial fibrillation: Secondary | ICD-10-CM

## 2016-05-15 DIAGNOSIS — I2510 Atherosclerotic heart disease of native coronary artery without angina pectoris: Secondary | ICD-10-CM

## 2016-05-15 LAB — POCT INR: INR: 2

## 2016-06-13 ENCOUNTER — Ambulatory Visit (INDEPENDENT_AMBULATORY_CARE_PROVIDER_SITE_OTHER): Payer: Medicare Other | Admitting: *Deleted

## 2016-06-13 DIAGNOSIS — Z5181 Encounter for therapeutic drug level monitoring: Secondary | ICD-10-CM

## 2016-06-13 DIAGNOSIS — I4891 Unspecified atrial fibrillation: Secondary | ICD-10-CM | POA: Diagnosis not present

## 2016-06-13 DIAGNOSIS — I2510 Atherosclerotic heart disease of native coronary artery without angina pectoris: Secondary | ICD-10-CM

## 2016-06-13 LAB — POCT INR: INR: 2.5

## 2016-07-11 ENCOUNTER — Ambulatory Visit (INDEPENDENT_AMBULATORY_CARE_PROVIDER_SITE_OTHER): Payer: Medicare Other | Admitting: *Deleted

## 2016-07-11 DIAGNOSIS — I4891 Unspecified atrial fibrillation: Secondary | ICD-10-CM | POA: Diagnosis not present

## 2016-07-11 DIAGNOSIS — Z5181 Encounter for therapeutic drug level monitoring: Secondary | ICD-10-CM | POA: Diagnosis not present

## 2016-07-11 DIAGNOSIS — I2510 Atherosclerotic heart disease of native coronary artery without angina pectoris: Secondary | ICD-10-CM

## 2016-07-11 LAB — POCT INR: INR: 2.4

## 2016-07-20 DIAGNOSIS — S60222A Contusion of left hand, initial encounter: Secondary | ICD-10-CM | POA: Diagnosis not present

## 2016-07-20 DIAGNOSIS — Z7901 Long term (current) use of anticoagulants: Secondary | ICD-10-CM | POA: Diagnosis not present

## 2016-07-20 DIAGNOSIS — S6992XA Unspecified injury of left wrist, hand and finger(s), initial encounter: Secondary | ICD-10-CM | POA: Diagnosis not present

## 2016-07-20 DIAGNOSIS — I10 Essential (primary) hypertension: Secondary | ICD-10-CM | POA: Diagnosis not present

## 2016-07-22 DIAGNOSIS — M79671 Pain in right foot: Secondary | ICD-10-CM | POA: Diagnosis not present

## 2016-07-22 DIAGNOSIS — I10 Essential (primary) hypertension: Secondary | ICD-10-CM | POA: Diagnosis not present

## 2016-07-22 DIAGNOSIS — M7989 Other specified soft tissue disorders: Secondary | ICD-10-CM | POA: Diagnosis not present

## 2016-07-31 DIAGNOSIS — D485 Neoplasm of uncertain behavior of skin: Secondary | ICD-10-CM | POA: Diagnosis not present

## 2016-07-31 DIAGNOSIS — Z85828 Personal history of other malignant neoplasm of skin: Secondary | ICD-10-CM | POA: Diagnosis not present

## 2016-07-31 DIAGNOSIS — C44519 Basal cell carcinoma of skin of other part of trunk: Secondary | ICD-10-CM | POA: Diagnosis not present

## 2016-07-31 DIAGNOSIS — D0439 Carcinoma in situ of skin of other parts of face: Secondary | ICD-10-CM | POA: Diagnosis not present

## 2016-07-31 DIAGNOSIS — M7981 Nontraumatic hematoma of soft tissue: Secondary | ICD-10-CM | POA: Diagnosis not present

## 2016-07-31 DIAGNOSIS — L570 Actinic keratosis: Secondary | ICD-10-CM | POA: Diagnosis not present

## 2016-08-06 DIAGNOSIS — C61 Malignant neoplasm of prostate: Secondary | ICD-10-CM | POA: Diagnosis not present

## 2016-08-06 DIAGNOSIS — Z79899 Other long term (current) drug therapy: Secondary | ICD-10-CM | POA: Diagnosis not present

## 2016-08-21 ENCOUNTER — Ambulatory Visit (INDEPENDENT_AMBULATORY_CARE_PROVIDER_SITE_OTHER): Payer: Medicare Other

## 2016-08-21 DIAGNOSIS — Z5181 Encounter for therapeutic drug level monitoring: Secondary | ICD-10-CM

## 2016-08-21 DIAGNOSIS — I4891 Unspecified atrial fibrillation: Secondary | ICD-10-CM

## 2016-08-21 DIAGNOSIS — I2510 Atherosclerotic heart disease of native coronary artery without angina pectoris: Secondary | ICD-10-CM

## 2016-08-21 LAB — POCT INR: INR: 3.1

## 2016-08-29 DIAGNOSIS — Z952 Presence of prosthetic heart valve: Secondary | ICD-10-CM | POA: Diagnosis not present

## 2016-08-29 DIAGNOSIS — K210 Gastro-esophageal reflux disease with esophagitis: Secondary | ICD-10-CM | POA: Diagnosis not present

## 2016-08-29 DIAGNOSIS — E785 Hyperlipidemia, unspecified: Secondary | ICD-10-CM | POA: Diagnosis not present

## 2016-08-29 DIAGNOSIS — M79671 Pain in right foot: Secondary | ICD-10-CM | POA: Diagnosis not present

## 2016-08-29 DIAGNOSIS — I2510 Atherosclerotic heart disease of native coronary artery without angina pectoris: Secondary | ICD-10-CM | POA: Diagnosis not present

## 2016-08-29 DIAGNOSIS — M7989 Other specified soft tissue disorders: Secondary | ICD-10-CM | POA: Diagnosis not present

## 2016-08-29 DIAGNOSIS — I890 Lymphedema, not elsewhere classified: Secondary | ICD-10-CM | POA: Diagnosis not present

## 2016-08-29 DIAGNOSIS — I10 Essential (primary) hypertension: Secondary | ICD-10-CM | POA: Diagnosis not present

## 2016-08-29 DIAGNOSIS — Z8546 Personal history of malignant neoplasm of prostate: Secondary | ICD-10-CM | POA: Diagnosis not present

## 2016-09-18 ENCOUNTER — Ambulatory Visit (INDEPENDENT_AMBULATORY_CARE_PROVIDER_SITE_OTHER): Payer: Medicare Other | Admitting: *Deleted

## 2016-09-18 DIAGNOSIS — I2510 Atherosclerotic heart disease of native coronary artery without angina pectoris: Secondary | ICD-10-CM

## 2016-09-18 DIAGNOSIS — Z5181 Encounter for therapeutic drug level monitoring: Secondary | ICD-10-CM | POA: Diagnosis not present

## 2016-09-18 DIAGNOSIS — I4891 Unspecified atrial fibrillation: Secondary | ICD-10-CM

## 2016-09-18 LAB — POCT INR: INR: 4.2

## 2016-10-01 ENCOUNTER — Ambulatory Visit (INDEPENDENT_AMBULATORY_CARE_PROVIDER_SITE_OTHER): Payer: Medicare Other | Admitting: *Deleted

## 2016-10-01 DIAGNOSIS — I4891 Unspecified atrial fibrillation: Secondary | ICD-10-CM | POA: Diagnosis not present

## 2016-10-01 DIAGNOSIS — Z5181 Encounter for therapeutic drug level monitoring: Secondary | ICD-10-CM

## 2016-10-01 DIAGNOSIS — I2510 Atherosclerotic heart disease of native coronary artery without angina pectoris: Secondary | ICD-10-CM | POA: Diagnosis not present

## 2016-10-01 LAB — POCT INR: INR: 2.1

## 2016-10-23 ENCOUNTER — Ambulatory Visit (INDEPENDENT_AMBULATORY_CARE_PROVIDER_SITE_OTHER): Payer: Medicare Other

## 2016-10-23 ENCOUNTER — Other Ambulatory Visit: Payer: Self-pay

## 2016-10-23 DIAGNOSIS — I2510 Atherosclerotic heart disease of native coronary artery without angina pectoris: Secondary | ICD-10-CM

## 2016-10-23 DIAGNOSIS — Z5181 Encounter for therapeutic drug level monitoring: Secondary | ICD-10-CM

## 2016-10-23 DIAGNOSIS — I4891 Unspecified atrial fibrillation: Secondary | ICD-10-CM | POA: Diagnosis not present

## 2016-10-23 LAB — PROTIME-INR
INR: 2.6 — ABNORMAL HIGH (ref 0.8–1.2)
Prothrombin Time: 27.3 s — ABNORMAL HIGH (ref 9.1–12.0)

## 2016-10-23 LAB — POCT INR: INR: 3.2

## 2016-10-23 MED ORDER — WARFARIN SODIUM 5 MG PO TABS: ORAL_TABLET | ORAL | 1 refills | Status: DC

## 2016-10-27 DIAGNOSIS — J040 Acute laryngitis: Secondary | ICD-10-CM | POA: Diagnosis not present

## 2016-11-13 ENCOUNTER — Ambulatory Visit (INDEPENDENT_AMBULATORY_CARE_PROVIDER_SITE_OTHER): Payer: Medicare Other | Admitting: Pharmacist

## 2016-11-13 DIAGNOSIS — I4891 Unspecified atrial fibrillation: Secondary | ICD-10-CM | POA: Diagnosis not present

## 2016-11-13 DIAGNOSIS — Z5181 Encounter for therapeutic drug level monitoring: Secondary | ICD-10-CM | POA: Diagnosis not present

## 2016-11-13 LAB — POCT INR: INR: 3

## 2016-12-07 IMAGING — US US EXTREM LOW VENOUS*L*
1 series · 13 of 24 positions shown · non-contrast
Comparison: None.

CLINICAL DATA: 88-year-old male with a history of left ankle
swelling



[Series 1: us extrem low venous*left* · 0.08mm/px · 13 of 47 slices shown]
[im 1/47]
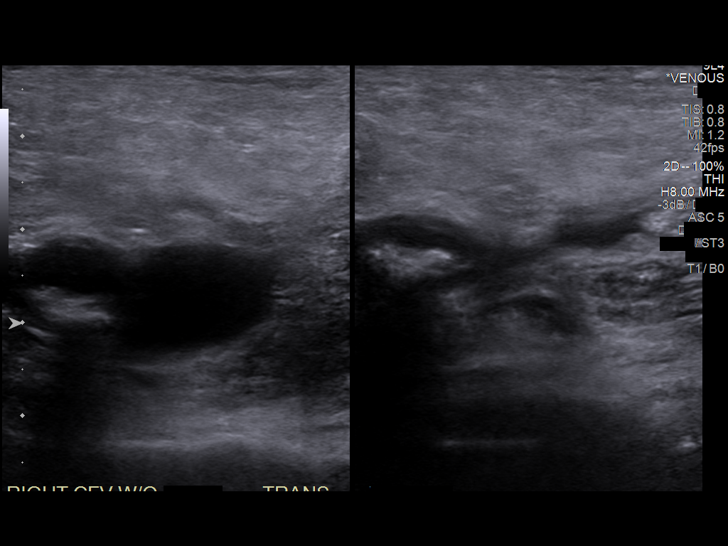
[im 5/47]
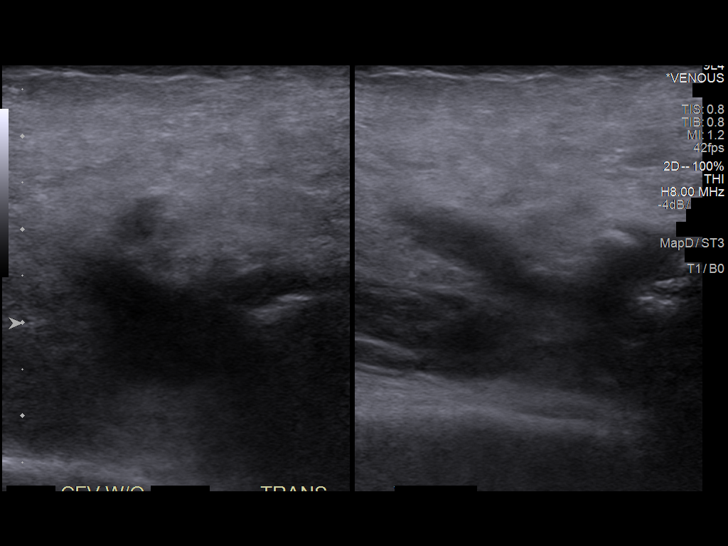
[im 9/47]
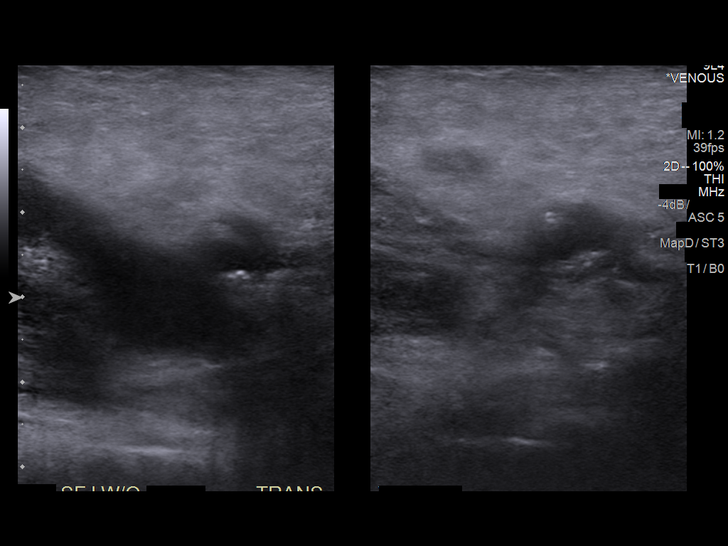
[im 13/47]
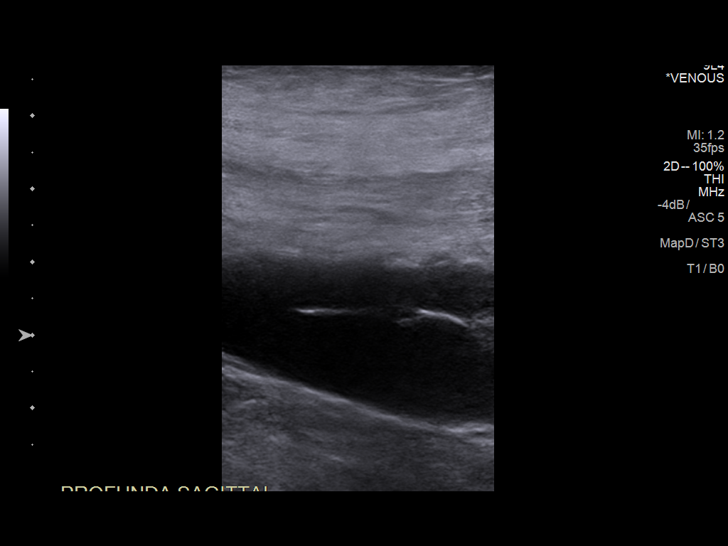
[im 17/47]
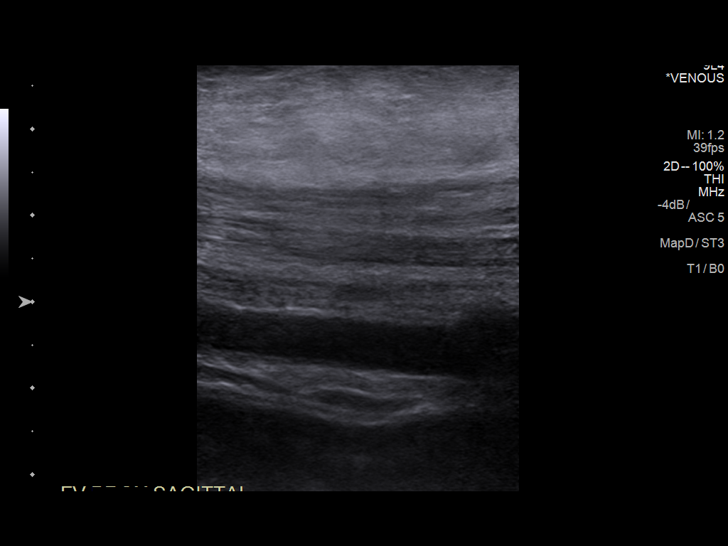
[im 21/47]
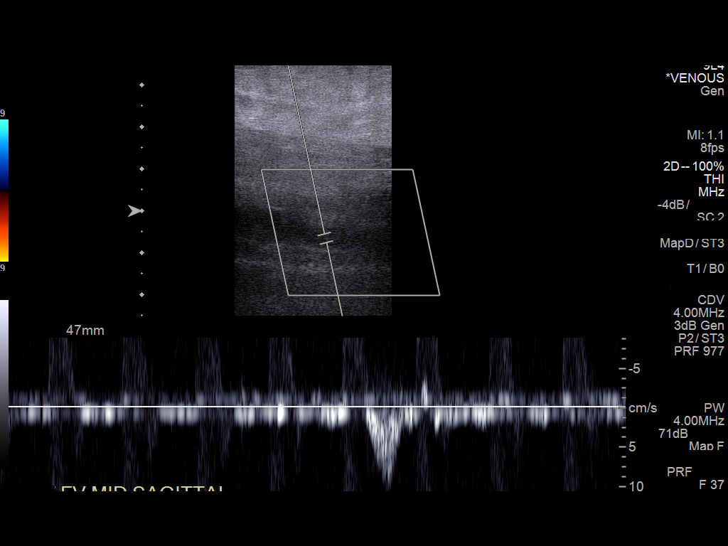
[im 25/47]
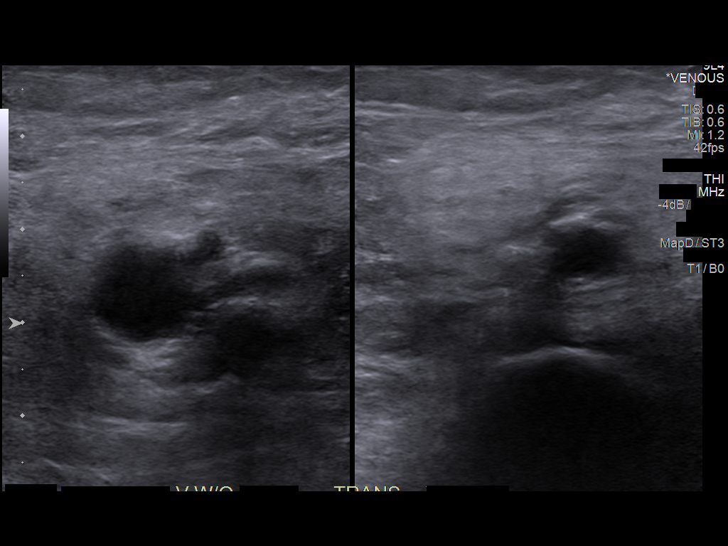
[im 27/47]
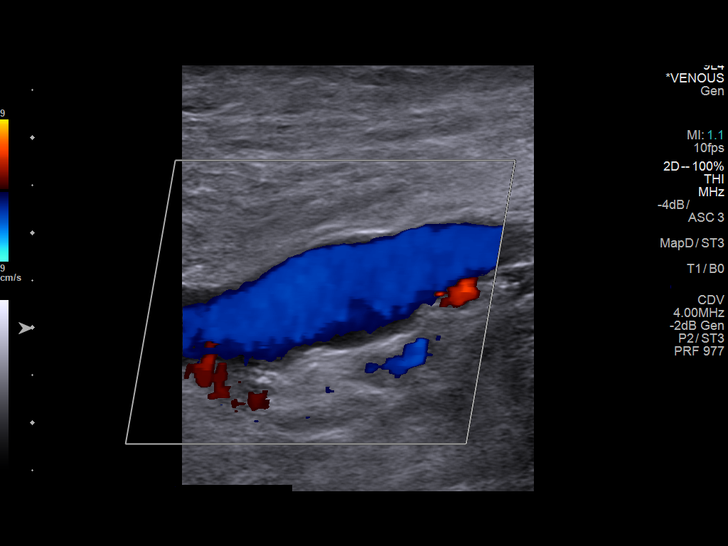
[im 31/47]
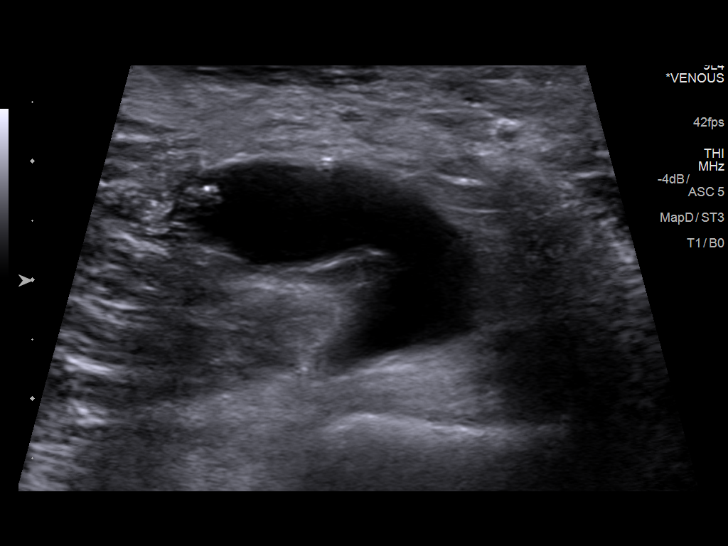
[im 35/47]
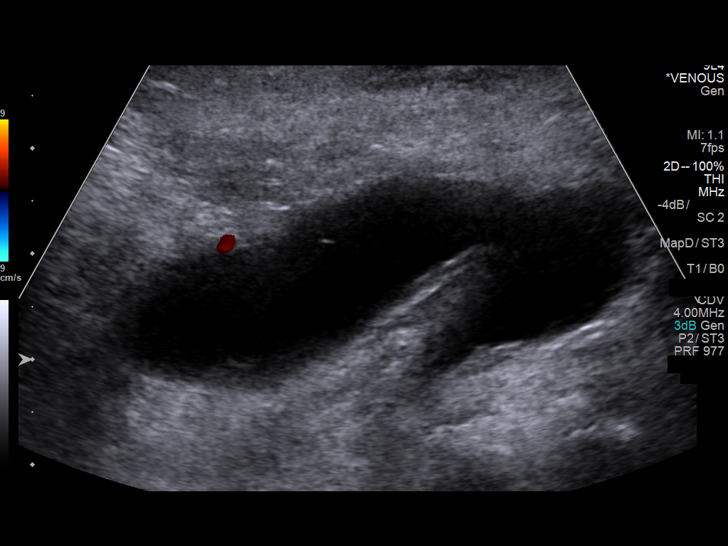
[im 39/47]
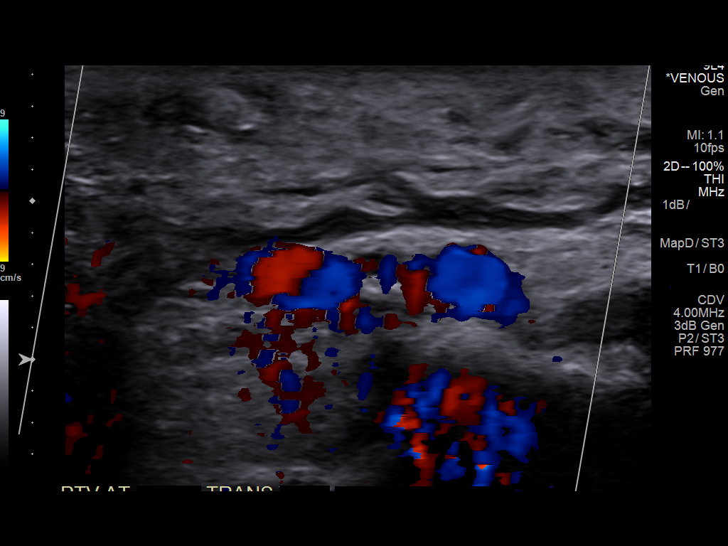
[im 43/47]
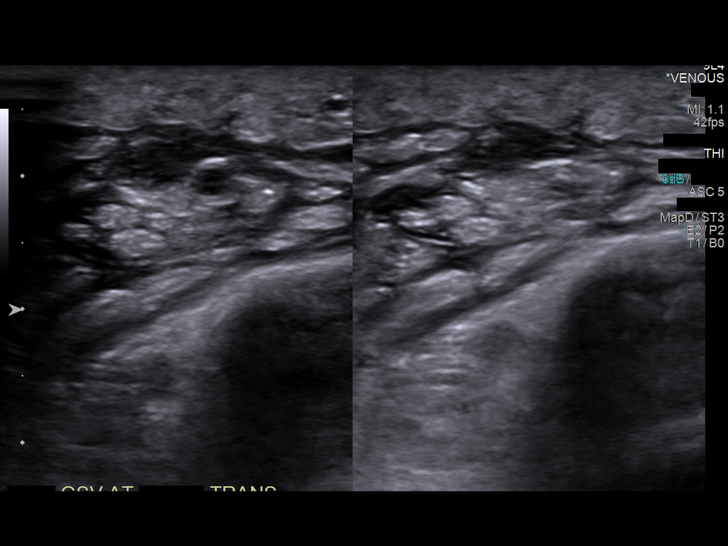
[im 47/47]
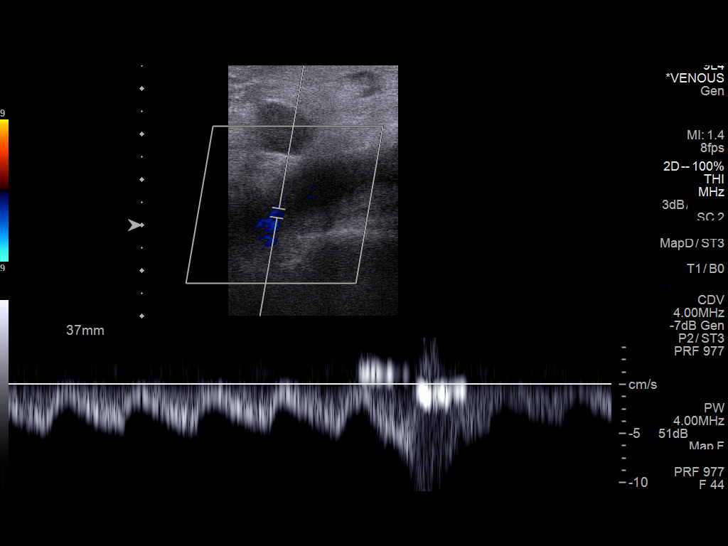

[13 of 24 positions shown; findings below may reference images not displayed]

FINDINGS: Contralateral Common Femoral Vein: Respiratory phasicity is normal
and symmetric with the symptomatic side. No evidence of thrombus.
Normal compressibility.

Common Femoral Vein: No thrombus. Compressible vein. Blunted
phasicity compared to the contralateral.

Saphenofemoral Junction: No evidence of thrombus. Normal
compressibility and flow on color Doppler imaging.

Profunda Femoral Vein: No evidence of thrombus. Normal
compressibility and flow on color Doppler imaging.

Femoral Vein: No evidence of thrombus. Normal compressibility,
respiratory phasicity and response to augmentation.

Popliteal Vein: No evidence of thrombus. Normal compressibility,
respiratory phasicity and response to augmentation.

Calf Veins: No evidence of thrombus. Normal compressibility and flow
on color Doppler imaging.

Superficial Great Saphenous Vein: No evidence of thrombus. Normal
compressibility and flow on color Doppler imaging.

Other Findings: Anechoic cystic lesion in the popliteal fossa
measuring as large as 4.4 cm, without internal flow.

Edema of the superficial tissues.
IMPRESSION: Sonographic survey of the left lower extremity negative for DVT.

Phasicity of the left common femoral vein is blunted compared to the
right, suggesting the possibility of more proximal stenosis or
occlusion. If there is concern for more central obstruction,
cross-sectional imaging may be considered.

Left sided Baker's cyst.

Edema of the left lower extremity.

## 2016-12-11 ENCOUNTER — Encounter (INDEPENDENT_AMBULATORY_CARE_PROVIDER_SITE_OTHER): Payer: Self-pay

## 2016-12-11 ENCOUNTER — Ambulatory Visit (INDEPENDENT_AMBULATORY_CARE_PROVIDER_SITE_OTHER): Payer: Medicare Other | Admitting: *Deleted

## 2016-12-11 DIAGNOSIS — I4891 Unspecified atrial fibrillation: Secondary | ICD-10-CM

## 2016-12-11 DIAGNOSIS — Z5181 Encounter for therapeutic drug level monitoring: Secondary | ICD-10-CM

## 2016-12-11 LAB — POCT INR: INR: 2.9

## 2016-12-22 ENCOUNTER — Telehealth: Payer: Self-pay | Admitting: Interventional Cardiology

## 2016-12-22 MED ORDER — METOPROLOL SUCCINATE ER 50 MG PO TB24: 50.0000 mg | ORAL_TABLET | Freq: Two times a day (BID) | ORAL | 0 refills | Status: DC

## 2016-12-22 NOTE — Telephone Encounter (Signed)
New message     *STAT* If patient is at the pharmacy, call can be transferred to refill team.   1. Which medications need to be refilled? (please list name of each medication and dose if known) metoprolol succinate (TOPROL-XL) 50 MG 24 hr tablet  2. Which pharmacy/location (including street and city if local pharmacy) is [facility]   3. Do they need a 30 day or 90 day supply? [place]

## 2016-12-22 NOTE — Telephone Encounter (Signed)
Pt's medication was sent to pt's pharmacy as requested. Confirmation received.  °

## 2017-01-20 ENCOUNTER — Ambulatory Visit (INDEPENDENT_AMBULATORY_CARE_PROVIDER_SITE_OTHER): Payer: Medicare Other | Admitting: *Deleted

## 2017-01-20 DIAGNOSIS — Z5181 Encounter for therapeutic drug level monitoring: Secondary | ICD-10-CM | POA: Diagnosis not present

## 2017-01-20 DIAGNOSIS — I4891 Unspecified atrial fibrillation: Secondary | ICD-10-CM

## 2017-01-20 LAB — POCT INR: INR: 2.7

## 2017-01-20 NOTE — Patient Instructions (Signed)
Continue taking 1 tablet daily. Recheck in 6 weeks. Call if scheduled for any procedures or has any new medications  [phone].

## 2017-02-03 DIAGNOSIS — D485 Neoplasm of uncertain behavior of skin: Secondary | ICD-10-CM | POA: Diagnosis not present

## 2017-02-03 DIAGNOSIS — L821 Other seborrheic keratosis: Secondary | ICD-10-CM | POA: Diagnosis not present

## 2017-02-03 DIAGNOSIS — L570 Actinic keratosis: Secondary | ICD-10-CM | POA: Diagnosis not present

## 2017-02-03 DIAGNOSIS — Z85828 Personal history of other malignant neoplasm of skin: Secondary | ICD-10-CM | POA: Diagnosis not present

## 2017-02-03 DIAGNOSIS — D045 Carcinoma in situ of skin of trunk: Secondary | ICD-10-CM | POA: Diagnosis not present

## 2017-02-05 DIAGNOSIS — C61 Malignant neoplasm of prostate: Secondary | ICD-10-CM | POA: Diagnosis not present

## 2017-02-10 DIAGNOSIS — J209 Acute bronchitis, unspecified: Secondary | ICD-10-CM | POA: Diagnosis not present

## 2017-02-10 DIAGNOSIS — J0100 Acute maxillary sinusitis, unspecified: Secondary | ICD-10-CM | POA: Diagnosis not present

## 2017-02-17 ENCOUNTER — Telehealth: Payer: Self-pay | Admitting: *Deleted

## 2017-02-17 DIAGNOSIS — Z7901 Long term (current) use of anticoagulants: Secondary | ICD-10-CM | POA: Diagnosis not present

## 2017-02-17 DIAGNOSIS — J209 Acute bronchitis, unspecified: Secondary | ICD-10-CM | POA: Diagnosis not present

## 2017-02-17 DIAGNOSIS — J069 Acute upper respiratory infection, unspecified: Secondary | ICD-10-CM | POA: Diagnosis not present

## 2017-02-17 LAB — PROTIME-INR: INR: 2.5 — AB (ref 0.9–1.1)

## 2017-02-17 NOTE — Telephone Encounter (Signed)
Wife called to inform us that the pt was placed on Cedifir 300mg  BID for 1 week & was stopped today & today the pt started Doxycycline 100mg  BID for 7 days. Advised wife that the Doxyycline interacts with Coumadin & can cause the INR to increase. She stated that her PCP office checked the INR today & wanted to know why they did this if they were prescribing a med that interacts, I told her I cannot answer for them but it is my responsibility to let them know what med interacts & she may want to defer that question to them. Also, suggested he has INR checked on Friday due to starting today & she stated she was unsure about doing so because they have a busy schedule & don't have time to be in another MD office when they have other appts to go to. Advised that it is important to have INR checked with this medication & she stated to make the appt. Therefore, offered her an appt for the pt & she stated to put it down but if they are too busy them they would call back & cancel. Again, stressed the importance of coming to appt & to have INR checked due to Doxycycline interaction with Coumadin. Appt was set for pt & noted that pt is on Doxy 100mg .

## 2017-02-18 ENCOUNTER — Telehealth: Payer: Self-pay | Admitting: *Deleted

## 2017-02-18 NOTE — Telephone Encounter (Signed)
Received INR results from [facility] and pt's INR yesterday was 2.5 and he was started on Doxycycline Called pt's home and had to leave message on voice mail that he needs to keep scheduled appt in coumadin clinic on Friday 02/20/2017 as there is interaction between coumadin and Doxycycline and to call our office if has any questions

## 2017-02-20 ENCOUNTER — Ambulatory Visit (INDEPENDENT_AMBULATORY_CARE_PROVIDER_SITE_OTHER): Payer: Medicare Other | Admitting: Pharmacist

## 2017-02-20 DIAGNOSIS — I4891 Unspecified atrial fibrillation: Secondary | ICD-10-CM

## 2017-02-20 DIAGNOSIS — Z5181 Encounter for therapeutic drug level monitoring: Secondary | ICD-10-CM | POA: Diagnosis not present

## 2017-02-20 LAB — POCT INR: INR: 3.1

## 2017-02-20 NOTE — Patient Instructions (Signed)
Description   Skip your Coumadin today (on doxycycline), then continue taking 1 tablet daily. Recheck in 2 weeks at visit with your wife. Call if scheduled for any procedures or has any new medications  [phone].

## 2017-02-22 DIAGNOSIS — J0100 Acute maxillary sinusitis, unspecified: Secondary | ICD-10-CM | POA: Diagnosis not present

## 2017-02-22 DIAGNOSIS — J040 Acute laryngitis: Secondary | ICD-10-CM | POA: Diagnosis not present

## 2017-03-02 DIAGNOSIS — J040 Acute laryngitis: Secondary | ICD-10-CM | POA: Diagnosis not present

## 2017-03-04 ENCOUNTER — Other Ambulatory Visit: Payer: Self-pay | Admitting: Interventional Cardiology

## 2017-03-04 ENCOUNTER — Ambulatory Visit (INDEPENDENT_AMBULATORY_CARE_PROVIDER_SITE_OTHER): Payer: Medicare Other | Admitting: *Deleted

## 2017-03-04 DIAGNOSIS — I4891 Unspecified atrial fibrillation: Secondary | ICD-10-CM

## 2017-03-04 DIAGNOSIS — Z0000 Encounter for general adult medical examination without abnormal findings: Secondary | ICD-10-CM | POA: Diagnosis not present

## 2017-03-04 DIAGNOSIS — R490 Dysphonia: Secondary | ICD-10-CM | POA: Diagnosis not present

## 2017-03-04 DIAGNOSIS — Z8546 Personal history of malignant neoplasm of prostate: Secondary | ICD-10-CM | POA: Diagnosis not present

## 2017-03-04 DIAGNOSIS — E785 Hyperlipidemia, unspecified: Secondary | ICD-10-CM | POA: Diagnosis not present

## 2017-03-04 DIAGNOSIS — K210 Gastro-esophageal reflux disease with esophagitis: Secondary | ICD-10-CM | POA: Diagnosis not present

## 2017-03-04 DIAGNOSIS — Z5181 Encounter for therapeutic drug level monitoring: Secondary | ICD-10-CM | POA: Diagnosis not present

## 2017-03-04 DIAGNOSIS — M7989 Other specified soft tissue disorders: Secondary | ICD-10-CM | POA: Diagnosis not present

## 2017-03-04 DIAGNOSIS — Z23 Encounter for immunization: Secondary | ICD-10-CM | POA: Diagnosis not present

## 2017-03-04 DIAGNOSIS — Z952 Presence of prosthetic heart valve: Secondary | ICD-10-CM | POA: Diagnosis not present

## 2017-03-04 DIAGNOSIS — I2510 Atherosclerotic heart disease of native coronary artery without angina pectoris: Secondary | ICD-10-CM | POA: Diagnosis not present

## 2017-03-04 DIAGNOSIS — Z79899 Other long term (current) drug therapy: Secondary | ICD-10-CM | POA: Diagnosis not present

## 2017-03-04 DIAGNOSIS — I890 Lymphedema, not elsewhere classified: Secondary | ICD-10-CM | POA: Diagnosis not present

## 2017-03-04 DIAGNOSIS — Z1389 Encounter for screening for other disorder: Secondary | ICD-10-CM | POA: Diagnosis not present

## 2017-03-04 DIAGNOSIS — M79671 Pain in right foot: Secondary | ICD-10-CM | POA: Diagnosis not present

## 2017-03-04 DIAGNOSIS — I10 Essential (primary) hypertension: Secondary | ICD-10-CM | POA: Diagnosis not present

## 2017-03-04 LAB — POCT INR: INR: 2.9

## 2017-03-04 NOTE — Patient Instructions (Signed)
Description   Continue taking 1 tablet daily. Recheck in 6 weeks. Call if scheduled for any procedures or has any new medications  [phone].     

## 2017-03-30 NOTE — Progress Notes (Signed)
Cardiology Office Note   Date:  04/01/2017   ID:  Robert [name], DOB 1927/07/14, MRN [id number]  PCP:  Josetta Huddle, MD    No chief complaint on file. CAD/AS   Wt Readings from Last 3 Encounters:  04/01/17 183 lb (83 kg)  04/01/16 176 lb (79.8 kg)  01/30/16 183 lb (83 kg)       History of Present Illness: Robert [name] is a 84 y.o. male  who had a BMS to the circumflex in 2011. He had AFib in 2/15. Workup revealed severe AS. He had AVR with single vessel CABG, Maze procedure and left atrial clip in 2015.Later in 2015, He had presyncope and his HR was in the 40s. Resolved with stopping beta blocker and digoxin. He did well for a while.   In 2/16, he had palpitations and went to the ER. He was in atrial flutter 2:1 block. He was sent home. He was started on metoprolol and Eliquis in the clinic the next day. Rhythm converted.   Eliquis was changed to COumadin due to cost reasons.  Chronic left ankle swelling which progressed to the whole left leg in 2017, vein removed for CABG. It  Was caused by lymph node blocking vein flow. Initally diagnosed with PrCA [phone]. Recent check showed PSA was elevated and has reduced with Lupron.  Swelling is nearly completely resolved after Lupron.  He had a w/u for DVT which was negative.  Following with Dr. Rosana Hoes for his prostate cancer.  Undetectable PSA in 12/18.  Denies : Chest pain. Dizziness.  Nitroglycerin use. Orthopnea. Palpitations. Paroxysmal nocturnal dyspnea. Shortness of breath. Syncope.   Occasional leg edema.  Controlled at this time.   Walking regularly.  He is going to the gym.  He uses the treadmill and bike.  He does some light weights.  He is busy in the house.   His wife has had some HR variability.    Home BP readings are in the 130/60 range.    Past Medical History:  Diagnosis Date  . Aortic stenosis    a. s/p tissue AVR 05/2013;  b. Echo (06/2013):  Mod LVH, EF 60-65%, no RWMA, Gr 2 DD, AVR ok  (mean 13 mmHg), MAC, mild MR, mod LAE, mild RAE, PASP 46 mmHg (mild pulmo HTN)  . Blood loss anemia    a. 05/2009 a/w GIB.  [name] CAD (coronary artery disease)    a. Cath 04/2009: BMS to LCx 04/2009; CTO of RCA with L-R collaterals, 40% ostial diag.  . Carpal tunnel syndrome    bilateral, carpal tunnel release x 2  . Esophageal ulcer    a. 05/2009 with hemorrhage - injected with epinephrine-Dr. Herbie Baltimore Buccini.  . Esophagitis    a. 05/2009 a/w GIB.  [name] GERD (gastroesophageal reflux disease)    EGD, Dr. Toney Rakes 2008  . GI bleed    a. 05/2009 as above: with associated anemia. Lower esophageal ulcer with extensive erosive esophagitis and large clot by EGD 05/2009.   [name] Hx of colonoscopy 2008   Dr. Toney Rakes, polyps- 5 yr f/u recommended  . Hyperlipidemia   . Hypertension   . PAF (paroxysmal atrial fibrillation) ([place])    a. Post-op AVR;  b. 04/2014 recurrent.  . Perforated ear drum    right ear drum, tube in left ear drum, Dr. Constance Holster  . Polymyalgia rheumatica ([place])   . Prostate cancer ([place])   . Vitamin D deficiency     Past Surgical History:  Procedure  Laterality Date  . AORTIC VALVE REPLACEMENT N/A 05/12/2013   Procedure: AORTIC VALVE REPLACEMENT (AVR);  Surgeon: Ivin Poot, MD;  Location: [place];  Service: Open Heart Surgery;  Laterality: N/A;  . CARDIAC CATHETERIZATION  2011  . CORONARY ARTERY BYPASS GRAFT N/A 05/12/2013   Procedure: CORONARY ARTERY BYPASS GRAFTING (CABG);  Surgeon: Ivin Poot, MD;  Location: [place];  Service: Open Heart Surgery;  Laterality: N/A;  CABG x 1 using left leg greater saphenous vein harvested endoscopically  . INTRAOPERATIVE TRANSESOPHAGEAL ECHOCARDIOGRAM N/A 05/12/2013   Procedure: INTRAOPERATIVE TRANSESOPHAGEAL ECHOCARDIOGRAM;  Surgeon: Ivin Poot, MD;  Location: [place];  Service: Open Heart Surgery;  Laterality: N/A;  . LEFT AND RIGHT HEART CATHETERIZATION WITH CORONARY ANGIOGRAM N/A 05/09/2013   Procedure: LEFT AND RIGHT HEART  CATHETERIZATION WITH CORONARY ANGIOGRAM;  Surgeon: Lorretta Harp, MD;  Location: [facility] CATH LAB;  Service: Cardiovascular;  Laterality: N/A;  . MAZE N/A 05/12/2013   Procedure: MAZE;  Surgeon: Ivin Poot, MD;  Location: [place];  Service: Open Heart Surgery;  Laterality: N/A;  . PROSTATECTOMY       Current Outpatient Medications  Medication Sig Dispense Refill  . Ascorbic Acid (VITAMIN C) 100 MG tablet Take 100 mg by mouth daily.    . cholecalciferol (VITAMIN D) 1000 UNITS tablet Take 1,000 Units by mouth daily.    [name] losartan (COZAAR) 25 MG tablet Take 25 mg by mouth daily.    . metoprolol succinate (TOPROL-XL) 50 MG 24 hr tablet Take 1 tablet (50 mg total) by mouth 2 (two) times daily. Please keep upcoming appt in January for future refills. Thank you 180 tablet 0  . Omega-3 Fatty Acids (FISH OIL) 1000 MG CAPS Take 2 capsules by mouth daily.    [name] omeprazole (PRILOSEC) 20 MG capsule Take 20 mg by mouth daily.    . simvastatin (ZOCOR) 40 MG tablet Take 40 mg by mouth daily.    . traMADol (ULTRAM) 50 MG tablet     . warfarin (COUMADIN) 5 MG tablet TAKE AS DIRECTED BY COUMADIN CLINIC 100 tablet 1   No current facility-administered medications for this visit.     Allergies:   Lisinopril and Lasix [furosemide]    Social History:  The patient  reports that  has never smoked. he has never used smokeless tobacco. He reports that he does not drink alcohol or use drugs.   Family History:  The patient's family history includes Breast cancer in his daughter; CVA in his mother; Kidney disease in his father.    ROS:  Please see the history of present illness.   Otherwise, review of systems are positive for occasional leg swelling; chronic leg/foot pain.   All other systems are reviewed and negative.    PHYSICAL EXAM: VS:  BP (!) 170/82   Pulse 81   Ht 5' 9"  (1.753 m)   Wt 183 lb (83 kg)   SpO2 98%   BMI 27.02 kg/m  , BMI Body mass index is 27.02 kg/m. GEN: Well nourished, well  developed, in no acute distress  HEENT: normal  Neck: no JVD, carotid bruits, or masses Cardiac: RRR; 2/6 early systolic murmur, no rubs, or gallops,no edema  Respiratory:  clear to auscultation bilaterally, normal work of breathing GI: soft, nontender, nondistended, + BS MS: no deformity or atrophy  Skin: warm and dry, no rash Neuro:  Strength and sensation are intact Psych: euthymic mood, full affect   EKG:   The ekg ordered today  demonstrates NSR, no ST changes   Recent Labs: No results found for requested labs within last 8760 hours.   Lipid Panel No results found for: CHOL, TRIG, HDL, CHOLHDL, VLDL, LDLCALC, LDLDIRECT   Other studies Reviewed: Additional studies/ records that were reviewed today with results demonstrating: labs from 1/19 were Hbg 13.1, Cr 0.71, potassium 4.0.   ASSESSMENT AND PLAN:  1. CAD: No angina.  COntinue aggressive secondary prevention.  Continue regular exercise.   2. AVR: SBE prophylaxis for dental procedures.  By exam, valve appears to be functioning well.  3. Atrial flutter: Coumadin for stroke prevention. 4. HTN: Increase Losartan to 50 mg daily.  BMet in one week.  F/u in HTN clinic.    Current medicines are reviewed at length with the patient today.  The patient concerns regarding his medicines were addressed.  The following changes have been made:  No change  Labs/ tests ordered today include:  No orders of the defined types were placed in this encounter.   Recommend 150 minutes/week of aerobic exercise Low fat, low carb, high fiber diet recommended  Disposition:   FU in 1 year   Signed, Larae Grooms, MD  04/01/2017 11:25 AM    [facility] [place], [place], [place]  [address] Phone: [phone]; Fax: [phone]

## 2017-04-01 ENCOUNTER — Encounter: Payer: Self-pay | Admitting: Interventional Cardiology

## 2017-04-01 ENCOUNTER — Ambulatory Visit (INDEPENDENT_AMBULATORY_CARE_PROVIDER_SITE_OTHER): Payer: Medicare Other | Admitting: Interventional Cardiology

## 2017-04-01 VITALS — BP 170/82 | HR 81 | Ht 69.0 in | Wt 183.0 lb

## 2017-04-01 DIAGNOSIS — E782 Mixed hyperlipidemia: Secondary | ICD-10-CM | POA: Diagnosis not present

## 2017-04-01 DIAGNOSIS — I480 Paroxysmal atrial fibrillation: Secondary | ICD-10-CM

## 2017-04-01 DIAGNOSIS — I10 Essential (primary) hypertension: Secondary | ICD-10-CM

## 2017-04-01 DIAGNOSIS — I2510 Atherosclerotic heart disease of native coronary artery without angina pectoris: Secondary | ICD-10-CM | POA: Diagnosis not present

## 2017-04-01 MED ORDER — LOSARTAN POTASSIUM 50 MG PO TABS: 50.0000 mg | ORAL_TABLET | Freq: Every day | ORAL | 3 refills | Status: DC

## 2017-04-01 NOTE — Patient Instructions (Signed)
Medication Instructions:  Your physician has recommended you make the following change in your medication:   INCREASE: losartan to 50 mg daily  Labwork: Your physician recommends that you return for lab work in: 2 weeks for BMET  Your physician recommends that you return for a FASTING lipid profile in: 3 months  Testing/Procedures: None ordered  Follow-Up: Your physician recommends that you schedule a follow-up appointment in: 2 weeks in the Hypertension Clinic for Blood Pressure Management.   Your physician wants you to follow-up in: 1 year with Dr. Irish Lack. You will receive a reminder letter in the mail two months in advance. If you don't receive a letter, please call our office to schedule the follow-up appointment.   Any Other Special Instructions Will Be Listed Below (If Applicable).  Bring your Blood Pressure Monitor to you appointment in the Hypertension Clinic.  If you need a refill on your cardiac medications before your next appointment, please call your pharmacy.

## 2017-04-16 ENCOUNTER — Telehealth: Payer: Self-pay

## 2017-04-16 ENCOUNTER — Ambulatory Visit (INDEPENDENT_AMBULATORY_CARE_PROVIDER_SITE_OTHER): Payer: Medicare Other | Admitting: Pharmacist

## 2017-04-16 ENCOUNTER — Other Ambulatory Visit: Payer: Medicare Other | Admitting: *Deleted

## 2017-04-16 VITALS — BP 172/78 | HR 66

## 2017-04-16 DIAGNOSIS — I2510 Atherosclerotic heart disease of native coronary artery without angina pectoris: Secondary | ICD-10-CM | POA: Diagnosis not present

## 2017-04-16 DIAGNOSIS — I480 Paroxysmal atrial fibrillation: Secondary | ICD-10-CM

## 2017-04-16 DIAGNOSIS — I10 Essential (primary) hypertension: Secondary | ICD-10-CM | POA: Diagnosis not present

## 2017-04-16 DIAGNOSIS — I4891 Unspecified atrial fibrillation: Secondary | ICD-10-CM

## 2017-04-16 DIAGNOSIS — E785 Hyperlipidemia, unspecified: Secondary | ICD-10-CM

## 2017-04-16 DIAGNOSIS — Z5181 Encounter for therapeutic drug level monitoring: Secondary | ICD-10-CM

## 2017-04-16 DIAGNOSIS — E782 Mixed hyperlipidemia: Secondary | ICD-10-CM | POA: Diagnosis not present

## 2017-04-16 LAB — BASIC METABOLIC PANEL
BUN/Creatinine Ratio: 16 (ref 10–24)
BUN: 11 mg/dL (ref 8–27)
CO2: 25 mmol/L (ref 20–29)
Calcium: 9.6 mg/dL (ref 8.6–10.2)
Chloride: 102 mmol/L (ref 96–106)
Creatinine, Ser: 0.69 mg/dL — ABNORMAL LOW (ref 0.76–1.27)
GFR calc Af Amer: 99 mL/min/{1.73_m2} (ref >59)
GFR calc non Af Amer: 85 mL/min/{1.73_m2} (ref >59)
Glucose: 102 mg/dL — ABNORMAL HIGH (ref 65–99)
Potassium: 4.4 mmol/L (ref 3.5–5.2)
Sodium: 142 mmol/L (ref 134–144)

## 2017-04-16 LAB — LIPID PANEL
Chol/HDL Ratio: 5.5 ratio — ABNORMAL HIGH (ref 0.0–5.0)
Cholesterol, Total: 182 mg/dL (ref 100–199)
HDL: 33 mg/dL — ABNORMAL LOW (ref >39)
LDL Calculated: 114 mg/dL — ABNORMAL HIGH (ref 0–99)
Triglycerides: 177 mg/dL — ABNORMAL HIGH (ref 0–149)
VLDL Cholesterol Cal: 35 mg/dL (ref 5–40)

## 2017-04-16 LAB — POCT INR: INR: 2.4

## 2017-04-16 MED ORDER — ATORVASTATIN CALCIUM 40 MG PO TABS: 40.0000 mg | ORAL_TABLET | Freq: Every day | ORAL | 3 refills | Status: DC

## 2017-04-16 NOTE — Addendum Note (Signed)
Addended by: Drue Novel I on: 04/16/2017 05:40 PM   Modules accepted: Orders

## 2017-04-16 NOTE — Patient Instructions (Signed)
Description   Continue taking 1 tablet daily. Recheck in 6 weeks. Call if scheduled for any procedures or has any new medications  [phone].     

## 2017-04-16 NOTE — Telephone Encounter (Signed)
-----   Message from Jettie Booze, MD sent at 04/16/2017  5:08 PM EST ----- Electrolytes ok.  Stop simvastatin.  Start atorvastatin 40 mg dialy.  Recheck lipids and liver in 3 months.

## 2017-04-16 NOTE — Patient Instructions (Signed)
It was nice to see you today  Continue to take your current medications  Bring in your home blood pressure cuff with you along with home blood pressure readings to your next INR check for Coumadin in 6 weeks  Call clinic with any concerns prior to then

## 2017-04-16 NOTE — Telephone Encounter (Signed)
Called and made patient aware of results and recommendations to stop simvastatin and start atorvastatin 40 mg QD. Fasting LIPIDS and LFTS ordered and appointment made for 07/16/17. Patient verbalized understanding and thanked me for the call.

## 2017-04-16 NOTE — Progress Notes (Signed)
Patient ID: Robert [name]                 DOB: 1927/06/18                      MRN: [id number]     HPI: Robert [name] is a 84 y.o. male referred by Dr. Irish Lack to HTN clinic. PMH is significant for BMS to circumflex in 2011, afib in 2015, severe aortic stenosis s/p AVR with single vessel CABG, Maze procedure, left atrial clip in 2015, prostate cancer. His home BP readings were in the 130/60 range, however clinic reading was elevated at 170/82 and his losartan was increased to 35m daily.  Patient presents today in good spirits with his wife. He reports tolerating higher dose of losartan well. Denies dizziness, blurred vision, falls, or headaches. He works out every morning and does not add salt to his food. He avoids caffeine. He recently purchased a new bicep BP cuff and his home reading the other day was 116/67. Highest systolic reading has been in the 120s per pt report. BP readings in clinic have been much higher. He took his losartan at 5am and waits to take his Toprol with food.  Current HTN meds: losartan 570mdaily, Toprol 5071mID Previously tried: beta blockers - bradycardia BP goal: <140/20m53mdue to advanced age  Family History: The patient's family history includes Breast cancer in his daughter; CVA in his mother; Kidney disease in his father.   Social History: The patient  reports that  has never smoked. he has never used smokeless tobacco. He reports that he does not drink alcohol or use drugs.   Diet: Avoids caffeine, does not cook with salt.   Exercise: Walking regularly. He is going to the gym. He uses the treadmill and bike. He does some light weights. He is busy in the house.   Home BP readings:   Wt Readings from Last 3 Encounters:  04/01/17 183 lb (83 kg)  04/01/16 176 lb (79.8 kg)  01/30/16 183 lb (83 kg)   BP Readings from Last 3 Encounters:  04/01/17 (!) 170/82  04/01/16 (!) 160/88  01/30/16 (!) 167/86   Pulse Readings from Last 3 Encounters:    04/01/17 81  04/01/16 72  01/30/16 (!) 114    Renal function: CrCl cannot be calculated (Patient's most recent lab result is older than the maximum 21 days allowed.).  Past Medical History:  Diagnosis Date  . Aortic stenosis    a. s/p tissue AVR 05/2013;  b. Echo (06/2013):  Mod LVH, EF 60-65%, no RWMA, Gr 2 DD, AVR ok (mean 13 mmHg), MAC, mild MR, mod LAE, mild RAE, PASP 46 mmHg (mild pulmo HTN)  . Blood loss anemia    a. 05/2009 a/w GIB.  . CAMarland Kitchen (coronary artery disease)    a. Cath 04/2009: BMS to LCx 04/2009; CTO of RCA with L-R collaterals, 40% ostial diag.  . Carpal tunnel syndrome    bilateral, carpal tunnel release x 2  . Esophageal ulcer    a. 05/2009 with hemorrhage - injected with epinephrine-Dr. RobeHerbie Baltimorecini.  . Esophagitis    a. 05/2009 a/w GIB.  . GEMarland KitchenD (gastroesophageal reflux disease)    EGD, Dr. ButlToney Rakes8  . GI bleed    a. 05/2009 as above: with associated anemia. Lower esophageal ulcer with extensive erosive esophagitis and large clot by EGD 05/2009.   . HxMarland Kitchenof colonoscopy 2008   Dr. ButlToney Rakeslyps- 5 yr  f/u recommended  . Hyperlipidemia   . Hypertension   . PAF (paroxysmal atrial fibrillation) ([place])    a. Post-op AVR;  b. 04/2014 recurrent.  . Perforated ear drum    right ear drum, tube in left ear drum, Dr. Constance Holster  . Polymyalgia rheumatica ([place])   . Prostate cancer ([place])   . Vitamin D deficiency     Current Outpatient Medications on File Prior to Visit  Medication Sig Dispense Refill  . Ascorbic Acid (VITAMIN C) 100 MG tablet Take 100 mg by mouth daily.    . cholecalciferol (VITAMIN D) 1000 UNITS tablet Take 1,000 Units by mouth daily.    [name] losartan (COZAAR) 50 MG tablet Take 1 tablet (50 mg total) by mouth daily. 90 tablet 3  . metoprolol succinate (TOPROL-XL) 50 MG 24 hr tablet Take 1 tablet (50 mg total) by mouth 2 (two) times daily. Please keep upcoming appt in January for future refills. Thank you 180 tablet 0  . Omega-3 Fatty Acids  (FISH OIL) 1000 MG CAPS Take 2 capsules by mouth daily.    [name] omeprazole (PRILOSEC) 20 MG capsule Take 20 mg by mouth daily.    . simvastatin (ZOCOR) 40 MG tablet Take 40 mg by mouth daily.    . traMADol (ULTRAM) 50 MG tablet     . warfarin (COUMADIN) 5 MG tablet TAKE AS DIRECTED BY COUMADIN CLINIC 100 tablet 1   No current facility-administered medications on file prior to visit.     Allergies  Allergen Reactions  . Lisinopril Cough and Other (See Comments)  . Lasix [Furosemide] Other (See Comments)    Pt is unaware of reaction     Assessment/Plan:  1. Hypertension - BP in clinic remains elevated above goal <140/62mHg, however all reported home readings are < 120/810mg. There is likely a component of white coat HTN. Due to controlled home readings and patient's age, will not increase any medications today. Continue losartan 5028maily and Toprol 26m36mD. Advised pt to record his BP readings at home and bring in these along with his home BP cuff to his next INR check in 6 weeks so that we can check accuracy of his home cuff.   Megan E. Supple, PharmD, CPP, BCACSugarloaf [id number] StreeteeColbert [id number]: [phone]: [phone] 08014/2019 8:01 AM

## 2017-05-27 ENCOUNTER — Ambulatory Visit (INDEPENDENT_AMBULATORY_CARE_PROVIDER_SITE_OTHER): Payer: Medicare Other | Admitting: *Deleted

## 2017-05-27 ENCOUNTER — Encounter (INDEPENDENT_AMBULATORY_CARE_PROVIDER_SITE_OTHER): Payer: Self-pay

## 2017-05-27 ENCOUNTER — Ambulatory Visit (INDEPENDENT_AMBULATORY_CARE_PROVIDER_SITE_OTHER): Payer: Medicare Other | Admitting: Pharmacist

## 2017-05-27 VITALS — BP 154/70 | HR 67

## 2017-05-27 DIAGNOSIS — I10 Essential (primary) hypertension: Secondary | ICD-10-CM | POA: Diagnosis not present

## 2017-05-27 DIAGNOSIS — I2510 Atherosclerotic heart disease of native coronary artery without angina pectoris: Secondary | ICD-10-CM | POA: Diagnosis not present

## 2017-05-27 DIAGNOSIS — I4891 Unspecified atrial fibrillation: Secondary | ICD-10-CM

## 2017-05-27 DIAGNOSIS — Z5181 Encounter for therapeutic drug level monitoring: Secondary | ICD-10-CM

## 2017-05-27 LAB — POCT INR: INR: 3

## 2017-05-27 MED ORDER — WARFARIN SODIUM 5 MG PO TABS: ORAL_TABLET | ORAL | 1 refills | Status: DC

## 2017-05-27 NOTE — Patient Instructions (Signed)
Description   Continue taking 1 tablet daily. Recheck in 6 weeks. Call if scheduled for any procedures or has any new medications  [phone].     

## 2017-05-27 NOTE — Patient Instructions (Signed)
Continue taking your current medications  Please bring in your home blood pressure cuff and home blood pressure readings to your next Coumadin visit

## 2017-05-27 NOTE — Progress Notes (Signed)
Patient ID: Robert [name]                 DOB: [birth year]                      MRN: [id number]     HPI: Robert [name] is a 84 y.o. male referred by Dr. Irish Lack to HTN clinic. PMH is significant for BMS to circumflex in 2011, afib in 2015, severe aortic stenosis s/p AVR with single vessel CABG, Maze procedure, left atrial clip in 2015, prostate cancer. At last visit, BP in clinic was elevated however all home readings were at goal. Due to likeliness for white coat HTN, no medication changes were made. Pt advised to bring home readings and BP cuff to today's visit to assess accuracy.  Pt presents today in good spirits with his wife. He did not bring his home cuff today. He reports home readings are mostly 130/60s. Low systolic reading of 710, high of 150. He denies dizziness, blurred vision, falls, or headaches. He works out every morning and does not add salt to his food. He avoids caffeine. He recently purchased a new bicep BP cuff. BP readings in clinic have been notably higher than home readings. He took his losartan at 5am and waits to take his Toprol with food.  Current HTN meds: losartan 66m daily, Toprol 571mBID Previously tried: beta blockers - bradycardia BP goal: <140/9050m due to advanced age  Family History: The patient's family history includes Breast cancer in his daughter; CVA in his mother; Kidney disease in his father.   Social History: The patient  reports that  has never smoked. he has never used smokeless tobacco. He reports that he does not drink alcohol or use drugs.   Diet: Avoids caffeine, does not cook with salt.   Exercise: Walking regularly. He is going to the gym. He uses the treadmill and bike. He does some light weights. He is busy in the house.   Wt Readings from Last 3 Encounters:  04/01/17 183 lb (83 kg)  04/01/16 176 lb (79.8 kg)  01/30/16 183 lb (83 kg)   BP Readings from Last 3 Encounters:  04/16/17 (!) 172/78  04/01/17 (!) 170/82    04/01/16 (!) 160/88   Pulse Readings from Last 3 Encounters:  04/16/17 66  04/01/17 81  04/01/16 72    Renal function: CrCl cannot be calculated (Patient's most recent lab result is older than the maximum 21 days allowed.).  Past Medical History:  Diagnosis Date  . Aortic stenosis    a. s/p tissue AVR 05/2013;  b. Echo (06/2013):  Mod LVH, EF 60-65%, no RWMA, Gr 2 DD, AVR ok (mean 13 mmHg), MAC, mild MR, mod LAE, mild RAE, PASP 46 mmHg (mild pulmo HTN)  . Blood loss anemia    a. 05/2009 a/w GIB.  . CMarland KitchenD (coronary artery disease)    a. Cath 04/2009: BMS to LCx 04/2009; CTO of RCA with L-R collaterals, 40% ostial diag.  . Carpal tunnel syndrome    bilateral, carpal tunnel release x 2  . Esophageal ulcer    a. 05/2009 with hemorrhage - injected with epinephrine-Dr. RobHerbie Baltimoreccini.  . Esophagitis    a. 05/2009 a/w GIB.  . GMarland KitchenRD (gastroesophageal reflux disease)    EGD, Dr. ButToney Rakes08  . GI bleed    a. 05/2009 as above: with associated anemia. Lower esophageal ulcer with extensive erosive esophagitis and large clot by EGD 05/2009.   .[name]  Hx of colonoscopy 2008   Dr. Toney Rakes, polyps- 5 yr f/u recommended  . Hyperlipidemia   . Hypertension   . PAF (paroxysmal atrial fibrillation) ([place])    a. Post-op AVR;  b. 04/2014 recurrent.  . Perforated ear drum    right ear drum, tube in left ear drum, Dr. Constance Holster  . Polymyalgia rheumatica ([place])   . Prostate cancer ([place])   . Vitamin D deficiency     Current Outpatient Medications on File Prior to Visit  Medication Sig Dispense Refill  . Ascorbic Acid (VITAMIN C) 100 MG tablet Take 100 mg by mouth daily.    [name] atorvastatin (LIPITOR) 40 MG tablet Take 1 tablet (40 mg total) by mouth daily. 90 tablet 3  . cholecalciferol (VITAMIN D) 1000 UNITS tablet Take 1,000 Units by mouth daily.    [name] losartan (COZAAR) 50 MG tablet Take 1 tablet (50 mg total) by mouth daily. 90 tablet 3  . metoprolol succinate (TOPROL-XL) 50 MG 24 hr tablet Take 1  tablet (50 mg total) by mouth 2 (two) times daily. Please keep upcoming appt in January for future refills. Thank you 180 tablet 0  . Omega-3 Fatty Acids (FISH OIL) 1000 MG CAPS Take 2 capsules by mouth daily.    [name] omeprazole (PRILOSEC) 20 MG capsule Take 20 mg by mouth daily.    . traMADol (ULTRAM) 50 MG tablet     . warfarin (COUMADIN) 5 MG tablet TAKE AS DIRECTED BY COUMADIN CLINIC 100 tablet 1   No current facility-administered medications on file prior to visit.     Allergies  Allergen Reactions  . Lisinopril Cough and Other (See Comments)  . Lasix [Furosemide] Other (See Comments)    Pt is unaware of reaction     Assessment/Plan:  1. Hypertension - BP in clinic remains elevated above goal <140/23mHg, however all reported home readings are at goal. There is likely a component of white coat HTN. Pt was advised to bring home BP cuff for calibration today however he did not do so.  Due to controlled home readings and patient's age, will not increase any medications today. Continue losartan 573mdaily and Toprol 5077mID. Advised pt to record his BP readings at home and bring in these along with his home BP cuff to his next INR check in 6 weeks so that we can check accuracy of his home cuff.   Megan E. Supple, PharmD, CPP, [id number]re[place] [id number]: [phone]: ([phone]/2019 7:08 AM

## 2017-05-28 ENCOUNTER — Ambulatory Visit: Payer: Medicare Other

## 2017-06-08 DIAGNOSIS — C44629 Squamous cell carcinoma of skin of left upper limb, including shoulder: Secondary | ICD-10-CM | POA: Diagnosis not present

## 2017-06-08 DIAGNOSIS — Z85828 Personal history of other malignant neoplasm of skin: Secondary | ICD-10-CM | POA: Diagnosis not present

## 2017-06-08 DIAGNOSIS — D485 Neoplasm of uncertain behavior of skin: Secondary | ICD-10-CM | POA: Diagnosis not present

## 2017-06-11 DIAGNOSIS — R040 Epistaxis: Secondary | ICD-10-CM | POA: Diagnosis not present

## 2017-06-11 DIAGNOSIS — J3489 Other specified disorders of nose and nasal sinuses: Secondary | ICD-10-CM | POA: Diagnosis not present

## 2017-06-11 DIAGNOSIS — Z7901 Long term (current) use of anticoagulants: Secondary | ICD-10-CM | POA: Diagnosis not present

## 2017-06-29 ENCOUNTER — Other Ambulatory Visit: Payer: Medicare Other

## 2017-07-07 ENCOUNTER — Other Ambulatory Visit: Payer: Self-pay | Admitting: *Deleted

## 2017-07-07 ENCOUNTER — Ambulatory Visit: Payer: Medicare Other

## 2017-07-07 MED ORDER — METOPROLOL SUCCINATE ER 50 MG PO TB24: 50.0000 mg | ORAL_TABLET | Freq: Two times a day (BID) | ORAL | 2 refills | Status: DC

## 2017-07-09 ENCOUNTER — Encounter (INDEPENDENT_AMBULATORY_CARE_PROVIDER_SITE_OTHER): Payer: Self-pay

## 2017-07-09 ENCOUNTER — Other Ambulatory Visit: Payer: Medicare Other

## 2017-07-09 ENCOUNTER — Ambulatory Visit (INDEPENDENT_AMBULATORY_CARE_PROVIDER_SITE_OTHER): Payer: Medicare Other | Admitting: Pharmacist

## 2017-07-09 ENCOUNTER — Ambulatory Visit (INDEPENDENT_AMBULATORY_CARE_PROVIDER_SITE_OTHER): Payer: Medicare Other | Admitting: *Deleted

## 2017-07-09 VITALS — BP 156/74 | HR 65

## 2017-07-09 DIAGNOSIS — I10 Essential (primary) hypertension: Secondary | ICD-10-CM | POA: Diagnosis not present

## 2017-07-09 DIAGNOSIS — E785 Hyperlipidemia, unspecified: Secondary | ICD-10-CM

## 2017-07-09 DIAGNOSIS — I4891 Unspecified atrial fibrillation: Secondary | ICD-10-CM | POA: Diagnosis not present

## 2017-07-09 DIAGNOSIS — Z5181 Encounter for therapeutic drug level monitoring: Secondary | ICD-10-CM | POA: Diagnosis not present

## 2017-07-09 DIAGNOSIS — I2510 Atherosclerotic heart disease of native coronary artery without angina pectoris: Secondary | ICD-10-CM

## 2017-07-09 DIAGNOSIS — Z952 Presence of prosthetic heart valve: Secondary | ICD-10-CM

## 2017-07-09 DIAGNOSIS — I480 Paroxysmal atrial fibrillation: Secondary | ICD-10-CM

## 2017-07-09 LAB — HEPATIC FUNCTION PANEL
ALT: 17 IU/L (ref 0–44)
AST: 21 IU/L (ref 0–40)
Albumin: 4.2 g/dL (ref 3.5–4.7)
Alkaline Phosphatase: 40 IU/L (ref 39–117)
Bilirubin Total: 0.4 mg/dL (ref 0.0–1.2)
Bilirubin, Direct: 0.15 mg/dL (ref 0.00–0.40)
Total Protein: 6 g/dL (ref 6.0–8.5)

## 2017-07-09 LAB — LIPID PANEL
Chol/HDL Ratio: 3.6 ratio (ref 0.0–5.0)
Cholesterol, Total: 119 mg/dL (ref 100–199)
HDL: 33 mg/dL — ABNORMAL LOW (ref >39)
LDL Calculated: 63 mg/dL (ref 0–99)
Triglycerides: 115 mg/dL (ref 0–149)
VLDL Cholesterol Cal: 23 mg/dL (ref 5–40)

## 2017-07-09 LAB — POCT INR: INR: 2.4

## 2017-07-09 NOTE — Progress Notes (Signed)
Patient ID: CRISTO AUSBURN                 DOB: December 26, 1927                      MRN: [id number]     HPI: Robert [name] is a 84 y.o. male referred by Dr. Irish Lack to HTN clinic. PMH is significant for BMS to circumflex in 2011, afib in 2015, severe aortic stenosis s/p AVR with single vessel CABG, Maze procedure, left atrial clip in 2015, prostate cancer. At last visit, BP in clinic was elevated however all home readings were at goal. Due to likeliness for white coat HTN, no medication changes were made. Pt advised to bring home readings and BP cuff to today's visit to assess accuracy.  Pt presents today in good spirits with his wife. He did not bring his home cuff today, but did bring in a list of home BP readings. Most readings range 120-130s-50/70s. Low BP of 90/53, high BP 160/76 - pt asymptomatic and feeling well overall. He denies dizziness, blurred vision, falls, or headaches. He works out every morning and does not add salt to his food. He avoids caffeine. He recently purchased a new bicep BP cuff. BP readings in clinic remain notably higher than home readings. He took his losartan at 5am and waits to take his Toprol with food.  Current HTN meds: losartan 65m daily, Toprol 559mBID (9am and 6pm) Previously tried: beta blockers - bradycardia BP goal: <140/9017m due to age  Family History: The patient's family history includes Breast cancer in his daughter; CVA in his mother; Kidney disease in his father.   Social History: The patient  reports that  has never smoked. he has never used smokeless tobacco. He reports that he does not drink alcohol or use drugs.   Diet: Avoids caffeine, does not cook with salt.   Exercise: Walking regularly. He is going to the gym. He uses the treadmill and bike. He does some light weights. He is busy in the house.   Wt Readings from Last 3 Encounters:  04/01/17 183 lb (83 kg)  04/01/16 176 lb (79.8 kg)  01/30/16 183 lb (83 kg)   BP Readings  from Last 3 Encounters:  05/27/17 (!) 154/70  04/16/17 (!) 172/78  04/01/17 (!) 170/82   Pulse Readings from Last 3 Encounters:  05/27/17 67  04/16/17 66  04/01/17 81    Renal function: CrCl cannot be calculated (Patient's most recent lab result is older than the maximum 21 days allowed.).  Past Medical History:  Diagnosis Date  . Aortic stenosis    a. s/p tissue AVR 05/2013;  b. Echo (06/2013):  Mod LVH, EF 60-65%, no RWMA, Gr 2 DD, AVR ok (mean 13 mmHg), MAC, mild MR, mod LAE, mild RAE, PASP 46 mmHg (mild pulmo HTN)  . Blood loss anemia    a. 05/2009 a/w GIB.  . CMarland KitchenD (coronary artery disease)    a. Cath 04/2009: BMS to LCx 04/2009; CTO of RCA with L-R collaterals, 40% ostial diag.  . Carpal tunnel syndrome    bilateral, carpal tunnel release x 2  . Esophageal ulcer    a. 05/2009 with hemorrhage - injected with epinephrine-Dr. RobHerbie Baltimoreccini.  . Esophagitis    a. 05/2009 a/w GIB.  . GMarland KitchenRD (gastroesophageal reflux disease)    EGD, Dr. ButToney Rakes08  . GI bleed    a. 05/2009 as above: with associated anemia. Lower esophageal ulcer  with extensive erosive esophagitis and large clot by EGD 05/2009.   [name] Hx of colonoscopy 2008   Dr. Toney Rakes, polyps- 5 yr f/u recommended  . Hyperlipidemia   . Hypertension   . PAF (paroxysmal atrial fibrillation) ([place])    a. Post-op AVR;  b. 04/2014 recurrent.  . Perforated ear drum    right ear drum, tube in left ear drum, Dr. Constance Holster  . Polymyalgia rheumatica ([place])   . Prostate cancer ([place])   . Vitamin D deficiency     Current Outpatient Medications on File Prior to Visit  Medication Sig Dispense Refill  . Ascorbic Acid (VITAMIN C) 100 MG tablet Take 100 mg by mouth daily.    [name] atorvastatin (LIPITOR) 40 MG tablet Take 1 tablet (40 mg total) by mouth daily. 90 tablet 3  . cholecalciferol (VITAMIN D) 1000 UNITS tablet Take 1,000 Units by mouth daily.    [name] losartan (COZAAR) 50 MG tablet Take 1 tablet (50 mg total) by mouth daily. 90 tablet  3  . metoprolol succinate (TOPROL-XL) 50 MG 24 hr tablet Take 1 tablet (50 mg total) by mouth 2 (two) times daily. 180 tablet 2  . Omega-3 Fatty Acids (FISH OIL) 1000 MG CAPS Take 2 capsules by mouth daily.    [name] omeprazole (PRILOSEC) 20 MG capsule Take 20 mg by mouth daily.    . traMADol (ULTRAM) 50 MG tablet     . warfarin (COUMADIN) 5 MG tablet TAKE AS DIRECTED BY COUMADIN CLINIC 100 tablet 1   No current facility-administered medications on file prior to visit.     Allergies  Allergen Reactions  . Lisinopril Cough and Other (See Comments)  . Lasix [Furosemide] Other (See Comments)    Pt is unaware of reaction     Assessment/Plan:  1. Hypertension - BP in clinic remains elevated above goal <140/38mHg, however all reported home readings are at goal. There is likely a component of white coat HTN. Pt was advised to bring home BP cuff for calibration again today however he did not do so.  Due to consistently controlled home readings, will continue losartan 547mand Toprol 5077mID. Advised pt to bring in his home BP cuff to his next INR check in 6 weeks so that we can check accuracy of his home cuff.   Megan E. Supple, PharmD, CPP, [id number]re[place]C [id number]: [phone]: [phone]/2019 7:12 AM

## 2017-07-09 NOTE — Patient Instructions (Addendum)
Description   Continue taking 1 tablet daily. Recheck in 4 weeks. Call if scheduled for any procedures or has any new medications  [phone].      

## 2017-07-09 NOTE — Patient Instructions (Signed)
It was nice to see you today  Continue taking your medications  Please bring in your home blood pressure cuff to your next Coumadin visit for a blood pressure check

## 2017-07-16 ENCOUNTER — Other Ambulatory Visit: Payer: Medicare Other

## 2017-07-20 DIAGNOSIS — Z7901 Long term (current) use of anticoagulants: Secondary | ICD-10-CM | POA: Diagnosis not present

## 2017-07-20 DIAGNOSIS — Z5181 Encounter for therapeutic drug level monitoring: Secondary | ICD-10-CM | POA: Diagnosis not present

## 2017-07-20 DIAGNOSIS — H1130 Conjunctival hemorrhage, unspecified eye: Secondary | ICD-10-CM | POA: Diagnosis not present

## 2017-08-06 DIAGNOSIS — Z5112 Encounter for antineoplastic immunotherapy: Secondary | ICD-10-CM | POA: Diagnosis not present

## 2017-08-06 DIAGNOSIS — Z192 Hormone resistant malignancy status: Secondary | ICD-10-CM | POA: Diagnosis not present

## 2017-08-06 DIAGNOSIS — C61 Malignant neoplasm of prostate: Secondary | ICD-10-CM | POA: Diagnosis not present

## 2017-08-11 ENCOUNTER — Ambulatory Visit: Payer: Medicare Other | Admitting: Pharmacist

## 2017-08-11 ENCOUNTER — Telehealth: Payer: Self-pay | Admitting: Pharmacist

## 2017-08-11 ENCOUNTER — Ambulatory Visit (INDEPENDENT_AMBULATORY_CARE_PROVIDER_SITE_OTHER): Payer: Medicare Other | Admitting: *Deleted

## 2017-08-11 DIAGNOSIS — Z952 Presence of prosthetic heart valve: Secondary | ICD-10-CM

## 2017-08-11 DIAGNOSIS — I480 Paroxysmal atrial fibrillation: Secondary | ICD-10-CM | POA: Diagnosis not present

## 2017-08-11 DIAGNOSIS — I4891 Unspecified atrial fibrillation: Secondary | ICD-10-CM

## 2017-08-11 DIAGNOSIS — Z5181 Encounter for therapeutic drug level monitoring: Secondary | ICD-10-CM | POA: Diagnosis not present

## 2017-08-11 LAB — POCT INR: INR: 2.1 (ref 2.0–3.0)

## 2017-08-11 NOTE — Patient Instructions (Signed)
Description   Continue taking 1 tablet daily. Recheck in 5 weeks. Call if scheduled for any procedures or has any new medications  [phone].      

## 2017-08-11 NOTE — Telephone Encounter (Signed)
Spoke with patient's wife who reports home BP readings have been controlled - reading today was 132/58. Home readings have historically been higher in the 902I systolic. Pt reports comparing home BP cuff readings to their daughter's cuff and reports accuracy between the two. Advised pt to continue on current meds and to continue to monitor BP at home. He will call if he notices any elevated BP readings at home and can schedule f/u visit at that time if needed.

## 2017-08-19 DIAGNOSIS — J209 Acute bronchitis, unspecified: Secondary | ICD-10-CM | POA: Diagnosis not present

## 2017-09-17 ENCOUNTER — Ambulatory Visit (INDEPENDENT_AMBULATORY_CARE_PROVIDER_SITE_OTHER): Payer: Medicare Other | Admitting: *Deleted

## 2017-09-17 DIAGNOSIS — Z5181 Encounter for therapeutic drug level monitoring: Secondary | ICD-10-CM

## 2017-09-17 DIAGNOSIS — Z952 Presence of prosthetic heart valve: Secondary | ICD-10-CM

## 2017-09-17 DIAGNOSIS — I480 Paroxysmal atrial fibrillation: Secondary | ICD-10-CM | POA: Diagnosis not present

## 2017-09-17 DIAGNOSIS — I4891 Unspecified atrial fibrillation: Secondary | ICD-10-CM

## 2017-09-17 LAB — POCT INR: INR: 1.8 — AB (ref 2.0–3.0)

## 2017-09-17 MED ORDER — WARFARIN SODIUM 5 MG PO TABS: ORAL_TABLET | ORAL | 1 refills | Status: DC

## 2017-09-17 NOTE — Patient Instructions (Signed)
Description   Today July 18th take 1 and 1/2 tablets then continue taking 1 tablet daily. Recheck in 2 weeks. Call if scheduled for any procedures or has any new medications  [phone].

## 2017-09-30 DIAGNOSIS — Z85828 Personal history of other malignant neoplasm of skin: Secondary | ICD-10-CM | POA: Diagnosis not present

## 2017-09-30 DIAGNOSIS — C44329 Squamous cell carcinoma of skin of other parts of face: Secondary | ICD-10-CM | POA: Diagnosis not present

## 2017-09-30 DIAGNOSIS — D485 Neoplasm of uncertain behavior of skin: Secondary | ICD-10-CM | POA: Diagnosis not present

## 2017-10-05 ENCOUNTER — Ambulatory Visit (INDEPENDENT_AMBULATORY_CARE_PROVIDER_SITE_OTHER): Payer: Medicare Other | Admitting: *Deleted

## 2017-10-05 DIAGNOSIS — I4891 Unspecified atrial fibrillation: Secondary | ICD-10-CM

## 2017-10-05 DIAGNOSIS — Z5181 Encounter for therapeutic drug level monitoring: Secondary | ICD-10-CM | POA: Diagnosis not present

## 2017-10-05 LAB — POCT INR: INR: 2.3 (ref 2.0–3.0)

## 2017-10-05 NOTE — Patient Instructions (Signed)
Description   Continue taking 1 tablet daily. Recheck in 4 weeks. Call if scheduled for any procedures or has any new medications  [phone].      

## 2017-11-12 ENCOUNTER — Ambulatory Visit (INDEPENDENT_AMBULATORY_CARE_PROVIDER_SITE_OTHER): Payer: Medicare Other

## 2017-11-12 DIAGNOSIS — Z5181 Encounter for therapeutic drug level monitoring: Secondary | ICD-10-CM | POA: Diagnosis not present

## 2017-11-12 DIAGNOSIS — I4891 Unspecified atrial fibrillation: Secondary | ICD-10-CM

## 2017-11-12 LAB — POCT INR: INR: 2.2 (ref 2.0–3.0)

## 2017-11-12 NOTE — Patient Instructions (Signed)
Description   Continue taking 1 tablet daily. Recheck in 5 weeks. Call if scheduled for any procedures or has any new medications  [phone].

## 2017-12-05 DIAGNOSIS — I872 Venous insufficiency (chronic) (peripheral): Secondary | ICD-10-CM | POA: Diagnosis not present

## 2017-12-17 ENCOUNTER — Ambulatory Visit (INDEPENDENT_AMBULATORY_CARE_PROVIDER_SITE_OTHER): Payer: Medicare Other | Admitting: *Deleted

## 2017-12-17 DIAGNOSIS — I4891 Unspecified atrial fibrillation: Secondary | ICD-10-CM

## 2017-12-17 DIAGNOSIS — Z5181 Encounter for therapeutic drug level monitoring: Secondary | ICD-10-CM | POA: Diagnosis not present

## 2017-12-17 LAB — POCT INR: INR: 2.4 (ref 2.0–3.0)

## 2017-12-17 NOTE — Patient Instructions (Signed)
Description   Continue taking 1 tablet daily. Recheck in 6 weeks. Call if scheduled for any procedures or has any new medications  [phone].     

## 2018-01-05 DIAGNOSIS — D485 Neoplasm of uncertain behavior of skin: Secondary | ICD-10-CM | POA: Diagnosis not present

## 2018-01-05 DIAGNOSIS — L570 Actinic keratosis: Secondary | ICD-10-CM | POA: Diagnosis not present

## 2018-01-05 DIAGNOSIS — D0461 Carcinoma in situ of skin of right upper limb, including shoulder: Secondary | ICD-10-CM | POA: Diagnosis not present

## 2018-01-05 DIAGNOSIS — Z23 Encounter for immunization: Secondary | ICD-10-CM | POA: Diagnosis not present

## 2018-01-05 DIAGNOSIS — Z85828 Personal history of other malignant neoplasm of skin: Secondary | ICD-10-CM | POA: Diagnosis not present

## 2018-01-21 ENCOUNTER — Ambulatory Visit (INDEPENDENT_AMBULATORY_CARE_PROVIDER_SITE_OTHER): Payer: Medicare Other

## 2018-01-21 DIAGNOSIS — I4891 Unspecified atrial fibrillation: Secondary | ICD-10-CM | POA: Diagnosis not present

## 2018-01-21 DIAGNOSIS — Z5181 Encounter for therapeutic drug level monitoring: Secondary | ICD-10-CM

## 2018-01-21 LAB — POCT INR: INR: 2.4 (ref 2.0–3.0)

## 2018-01-21 NOTE — Patient Instructions (Signed)
Description   Continue taking 1 tablet daily. Recheck in 6 weeks. Call if scheduled for any procedures or has any new medications  [phone].

## 2018-01-26 DIAGNOSIS — R600 Localized edema: Secondary | ICD-10-CM | POA: Diagnosis not present

## 2018-01-26 DIAGNOSIS — C61 Malignant neoplasm of prostate: Secondary | ICD-10-CM | POA: Diagnosis not present

## 2018-01-26 DIAGNOSIS — M7989 Other specified soft tissue disorders: Secondary | ICD-10-CM | POA: Diagnosis not present

## 2018-01-26 DIAGNOSIS — Z191 Hormone sensitive malignancy status: Secondary | ICD-10-CM | POA: Diagnosis not present

## 2018-01-26 DIAGNOSIS — Z79818 Long term (current) use of other agents affecting estrogen receptors and estrogen levels: Secondary | ICD-10-CM | POA: Diagnosis not present

## 2018-02-05 DIAGNOSIS — I87322 Chronic venous hypertension (idiopathic) with inflammation of left lower extremity: Secondary | ICD-10-CM | POA: Diagnosis not present

## 2018-02-05 DIAGNOSIS — L03116 Cellulitis of left lower limb: Secondary | ICD-10-CM | POA: Diagnosis not present

## 2018-02-08 DIAGNOSIS — L03116 Cellulitis of left lower limb: Secondary | ICD-10-CM | POA: Diagnosis not present

## 2018-02-15 ENCOUNTER — Other Ambulatory Visit: Payer: Self-pay | Admitting: Interventional Cardiology

## 2018-02-15 DIAGNOSIS — L03116 Cellulitis of left lower limb: Secondary | ICD-10-CM | POA: Diagnosis not present

## 2018-02-15 DIAGNOSIS — I890 Lymphedema, not elsewhere classified: Secondary | ICD-10-CM | POA: Diagnosis not present

## 2018-03-04 ENCOUNTER — Ambulatory Visit (INDEPENDENT_AMBULATORY_CARE_PROVIDER_SITE_OTHER): Payer: Medicare Other

## 2018-03-04 DIAGNOSIS — I4891 Unspecified atrial fibrillation: Secondary | ICD-10-CM | POA: Diagnosis not present

## 2018-03-04 DIAGNOSIS — Z5181 Encounter for therapeutic drug level monitoring: Secondary | ICD-10-CM | POA: Diagnosis not present

## 2018-03-04 LAB — POCT INR: INR: 1.2 — AB (ref 2.0–3.0)

## 2018-03-04 NOTE — Patient Instructions (Signed)
Description   Take 1.5 tablets today and tomorrow, then resume same dosage 1 tablet daily. Recheck in 2 weeks. Call if scheduled for any procedures or has any new medications  [phone].

## 2018-03-05 NOTE — Progress Notes (Signed)
Cardiology Office Note   Date:  03/08/2018   ID:  Robert [name], DOB Jul 25, 1927, MRN [id number]  PCP:  Josetta Huddle, MD    No chief complaint on file.  CAD  Wt Readings from Last 3 Encounters:  03/08/18 83.4 kg  04/01/17 83 kg  04/01/16 79.8 kg       History of Present Illness: Robert [name] is a 85 y.o. male  who had a BMS to the circumflex in 2011. He had AFib in 2/15. Workup revealed severe AS. He had AVR with single vessel CABG, Maze procedure and left atrial clip in 2015.Later in 2015, He had presyncope and his HR was in the 40s. Resolved with stopping beta blocker and digoxin. He did well for a while.   In 2/16, he had palpitations and went to the ER. He was in atrial flutter 2:1 block. He was sent home. He was started on metoprolol and Eliquis in the clinic the next day. Rhythm converted.   Eliquis was changed to COumadin due to cost reasons.  Chronic left ankle swelling which progressed to the whole left leg in 2017, vein removed for CABG.It Was caused by lymph node blocking vein flow. Initally diagnosed with PrCA [id number]. Recent check showed PSA was elevated and has reduced with Lupron. Swelling is nearly completely resolved after Lupron. He had a w/u for DVT which was negative. Following with Dr. Rosana Hoes for his prostate cancer.  Undetectable PSA in 12/18.  Occasional leg edema.  Left leg cellulitis in 8/19, that has been persistent.  U/s showed no DVT. Finished antibiotic course.  It is improving.  Swelling persists during the day.    Denies : Chest pain. Dizziness. Leg edema. Nitroglycerin use. Orthopnea. Palpitations. Paroxysmal nocturnal dyspnea. Shortness of breath. Syncope.   Past Medical History:  Diagnosis Date  . Aortic stenosis    a. s/p tissue AVR 05/2013;  b. Echo (06/2013):  Mod LVH, EF 60-65%, no RWMA, Gr 2 DD, AVR ok (mean 13 mmHg), MAC, mild MR, mod LAE, mild RAE, PASP 46 mmHg (mild pulmo HTN)  . Blood loss anemia    a.  05/2009 a/w GIB.  [name] CAD (coronary artery disease)    a. Cath 04/2009: BMS to LCx 04/2009; CTO of RCA with L-R collaterals, 40% ostial diag.  . Carpal tunnel syndrome    bilateral, carpal tunnel release x 2  . Esophageal ulcer    a. 05/2009 with hemorrhage - injected with epinephrine-Dr. Herbie [place] Buccini.  . Esophagitis    a. 05/2009 a/w GIB.  [name] GERD (gastroesophageal reflux disease)    EGD, Dr. Toney Rakes 2008  . GI bleed    a. 05/2009 as above: with associated anemia. Lower esophageal ulcer with extensive erosive esophagitis and large clot by EGD 05/2009.   [name] Hx of colonoscopy 2008   Dr. Toney Rakes, polyps- 5 yr f/u recommended  . Hyperlipidemia   . Hypertension   . PAF (paroxysmal atrial fibrillation) ([place])    a. Post-op AVR;  b. 04/2014 recurrent.  . Perforated ear drum    right ear drum, tube in left ear drum, Dr. Constance Holster  . Polymyalgia rheumatica ([place])   . Prostate cancer ([place])   . Vitamin D deficiency     Past Surgical History:  Procedure Laterality Date  . AORTIC VALVE REPLACEMENT N/A 05/12/2013   Procedure: AORTIC VALVE REPLACEMENT (AVR);  Surgeon: Ivin Poot, MD;  Location: [facility];  Service: Open Heart Surgery;  Laterality: N/A;  . CARDIAC CATHETERIZATION  2011  . CORONARY ARTERY BYPASS GRAFT N/A 05/12/2013   Procedure: CORONARY ARTERY BYPASS GRAFTING (CABG);  Surgeon: Ivin Poot, MD;  Location: [facility];  Service: Open Heart Surgery;  Laterality: N/A;  CABG x 1 using left leg greater saphenous vein harvested endoscopically  . INTRAOPERATIVE TRANSESOPHAGEAL ECHOCARDIOGRAM N/A 05/12/2013   Procedure: INTRAOPERATIVE TRANSESOPHAGEAL ECHOCARDIOGRAM;  Surgeon: Ivin Poot, MD;  Location: [facility];  Service: Open Heart Surgery;  Laterality: N/A;  . LEFT AND RIGHT HEART CATHETERIZATION WITH CORONARY ANGIOGRAM N/A 05/09/2013   Procedure: LEFT AND RIGHT HEART CATHETERIZATION WITH CORONARY ANGIOGRAM;  Surgeon: Lorretta Harp, MD;  Location: [facility] CATH LAB;  Service: Cardiovascular;   Laterality: N/A;  . MAZE N/A 05/12/2013   Procedure: MAZE;  Surgeon: Ivin Poot, MD;  Location: [facility];  Service: Open Heart Surgery;  Laterality: N/A;  . PROSTATECTOMY       Current Outpatient Medications  Medication Sig Dispense Refill  . Ascorbic Acid (VITAMIN C) 100 MG tablet Take 100 mg by mouth daily.    [name] atorvastatin (LIPITOR) 40 MG tablet Take 1 tablet (40 mg total) by mouth daily. 90 tablet 3  . cholecalciferol (VITAMIN D) 1000 UNITS tablet Take 1,000 Units by mouth daily.    [name] losartan (COZAAR) 50 MG tablet Take 1 tablet (50 mg total) by mouth daily. Please keep upcoming appt in March with Dr. Irish Lack for future refills. Thank you 90 tablet 0  . metoprolol succinate (TOPROL-XL) 50 MG 24 hr tablet Take 1 tablet (50 mg total) by mouth 2 (two) times daily. 180 tablet 2  . Omega-3 Fatty Acids (FISH OIL) 1000 MG CAPS Take 2 capsules by mouth daily.    [name] omeprazole (PRILOSEC) 20 MG capsule Take 20 mg by mouth daily.    . traMADol (ULTRAM) 50 MG tablet     . warfarin (COUMADIN) 5 MG tablet TAKE AS DIRECTED BY COUMADIN CLINIC 100 tablet 1  . simvastatin (ZOCOR) 20 MG tablet      No current facility-administered medications for this visit.     Allergies:   Lisinopril and Lasix [furosemide]    Social History:  The patient  reports that he has never smoked. He has never used smokeless tobacco. He reports that he does not drink alcohol or use drugs.   Family History:  The patient's family history includes Breast cancer in his daughter; CVA in his mother; Kidney disease in his father.    ROS:  Please see the history of present illness.   Otherwise, review of systems are positive for leg swelling-cellulitis.   All other systems are reviewed and negative.    PHYSICAL EXAM: VS:  BP (!) 160/80   Pulse 83   Ht 5' 9"  (1.753 m)   Wt 83.4 kg   SpO2 99%   BMI 27.14 kg/m  , BMI Body mass index is 27.14 kg/m. GEN: Well nourished, well developed, in no acute distress  HEENT: normal   Neck: no JVD, carotid bruits, or masses Cardiac: RRR; 1 out of 6 systolic murmur, no rubs, or gallops, bilateral leg edema -wearing compression stockings Respiratory:  clear to auscultation bilaterally, normal work of breathing GI: soft, nontender, nondistended, + BS MS: no deformity or atrophy  Skin: warm and dry, no rash Neuro:  Strength and sensation are intact Psych: euthymic mood, full affect   EKG:   The ekg ordered today demonstrates NSR, PVCs, no ST changes   Recent Labs: 04/16/2017: BUN 11; Creatinine, Ser 0.69; Potassium 4.4;  Sodium 142 07/09/2017: ALT 17   Lipid Panel    Component Value Date/Time   CHOL 119 07/09/2017 0824   TRIG 115 07/09/2017 0824   HDL 33 (L) 07/09/2017 0824   CHOLHDL 3.6 07/09/2017 0824   LDLCALC 63 07/09/2017 0824     Other studies Reviewed: Additional studies/ records that were reviewed today with results demonstrating: labs reviewed.   ASSESSMENT AND PLAN:  1. CAD: No angina.  Continue aggressive secondary prevention. 2. AVR: SBE prophylaxis.  Dentures at this point so not an issue.  3. Atrial flutter: Warfarin for stroke prevention.  No symptoms of palpitations. 4. Hypertension: At home, BP better controlled.  5. Anticoagulated: No bleeding issues.  Be careful to avoid falls.   Current medicines are reviewed at length with the patient today.  The patient concerns regarding his medicines were addressed.  The following changes have been made:    Labs/ tests ordered today include:  No orders of the defined types were placed in this encounter.   Recommend 150 minutes/week of aerobic exercise Low fat, low carb, high fiber diet recommended  Disposition:   FU in 1 year   Signed, Larae Grooms, MD  03/08/2018 10:36 AM    [facility] [address], [place], [place]  [address] Phone: [phone]; Fax: [phone]

## 2018-03-08 ENCOUNTER — Encounter

## 2018-03-08 ENCOUNTER — Ambulatory Visit (INDEPENDENT_AMBULATORY_CARE_PROVIDER_SITE_OTHER): Payer: Medicare Other | Admitting: Interventional Cardiology

## 2018-03-08 ENCOUNTER — Encounter: Payer: Self-pay | Admitting: Interventional Cardiology

## 2018-03-08 VITALS — BP 160/80 | HR 83 | Ht 69.0 in | Wt 183.8 lb

## 2018-03-08 DIAGNOSIS — Z7901 Long term (current) use of anticoagulants: Secondary | ICD-10-CM | POA: Diagnosis not present

## 2018-03-08 DIAGNOSIS — I10 Essential (primary) hypertension: Secondary | ICD-10-CM | POA: Diagnosis not present

## 2018-03-08 DIAGNOSIS — I4892 Unspecified atrial flutter: Secondary | ICD-10-CM

## 2018-03-08 DIAGNOSIS — I2510 Atherosclerotic heart disease of native coronary artery without angina pectoris: Secondary | ICD-10-CM

## 2018-03-08 DIAGNOSIS — Z952 Presence of prosthetic heart valve: Secondary | ICD-10-CM

## 2018-03-08 MED ORDER — ATORVASTATIN CALCIUM 40 MG PO TABS: 40.0000 mg | ORAL_TABLET | Freq: Every day | ORAL | 3 refills | Status: DC

## 2018-03-08 NOTE — Patient Instructions (Signed)

## 2018-03-11 ENCOUNTER — Other Ambulatory Visit: Payer: Self-pay | Admitting: Interventional Cardiology

## 2018-03-18 ENCOUNTER — Ambulatory Visit (INDEPENDENT_AMBULATORY_CARE_PROVIDER_SITE_OTHER): Payer: Medicare Other

## 2018-03-18 DIAGNOSIS — I890 Lymphedema, not elsewhere classified: Secondary | ICD-10-CM | POA: Diagnosis not present

## 2018-03-18 DIAGNOSIS — Z7901 Long term (current) use of anticoagulants: Secondary | ICD-10-CM | POA: Diagnosis not present

## 2018-03-18 DIAGNOSIS — Z5181 Encounter for therapeutic drug level monitoring: Secondary | ICD-10-CM | POA: Diagnosis not present

## 2018-03-18 DIAGNOSIS — L03116 Cellulitis of left lower limb: Secondary | ICD-10-CM | POA: Diagnosis not present

## 2018-03-18 DIAGNOSIS — D500 Iron deficiency anemia secondary to blood loss (chronic): Secondary | ICD-10-CM | POA: Diagnosis not present

## 2018-03-18 DIAGNOSIS — Z0000 Encounter for general adult medical examination without abnormal findings: Secondary | ICD-10-CM | POA: Diagnosis not present

## 2018-03-18 DIAGNOSIS — E559 Vitamin D deficiency, unspecified: Secondary | ICD-10-CM | POA: Diagnosis not present

## 2018-03-18 DIAGNOSIS — Z1389 Encounter for screening for other disorder: Secondary | ICD-10-CM | POA: Diagnosis not present

## 2018-03-18 DIAGNOSIS — I4891 Unspecified atrial fibrillation: Secondary | ICD-10-CM

## 2018-03-18 DIAGNOSIS — I2510 Atherosclerotic heart disease of native coronary artery without angina pectoris: Secondary | ICD-10-CM | POA: Diagnosis not present

## 2018-03-18 DIAGNOSIS — I10 Essential (primary) hypertension: Secondary | ICD-10-CM | POA: Diagnosis not present

## 2018-03-18 DIAGNOSIS — Z952 Presence of prosthetic heart valve: Secondary | ICD-10-CM | POA: Diagnosis not present

## 2018-03-18 DIAGNOSIS — Z79899 Other long term (current) drug therapy: Secondary | ICD-10-CM | POA: Diagnosis not present

## 2018-03-18 DIAGNOSIS — E785 Hyperlipidemia, unspecified: Secondary | ICD-10-CM | POA: Diagnosis not present

## 2018-03-18 LAB — POCT INR: INR: 1.7 — AB (ref 2.0–3.0)

## 2018-03-18 NOTE — Patient Instructions (Signed)
Description   Take 1.5 tablets today, then start taking 1 tablet daily except 1.5 tablets on Mondays. Recheck in 2 weeks. Call if scheduled for any procedures or has any new medications  [phone].

## 2018-04-01 ENCOUNTER — Ambulatory Visit (INDEPENDENT_AMBULATORY_CARE_PROVIDER_SITE_OTHER): Payer: Medicare Other | Admitting: *Deleted

## 2018-04-01 DIAGNOSIS — I4891 Unspecified atrial fibrillation: Secondary | ICD-10-CM

## 2018-04-01 DIAGNOSIS — Z5181 Encounter for therapeutic drug level monitoring: Secondary | ICD-10-CM

## 2018-04-01 LAB — POCT INR: INR: 2.3 (ref 2.0–3.0)

## 2018-04-01 MED ORDER — WARFARIN SODIUM 5 MG PO TABS: ORAL_TABLET | ORAL | 1 refills | Status: DC

## 2018-04-01 NOTE — Patient Instructions (Signed)
Description   Continue taking 1 tablet daily except 1.5 tablets on Mondays. Recheck in 3 weeks. Call if scheduled for any procedures or has any new medications  [phone].

## 2018-04-12 DIAGNOSIS — I872 Venous insufficiency (chronic) (peripheral): Secondary | ICD-10-CM | POA: Diagnosis not present

## 2018-04-12 DIAGNOSIS — I890 Lymphedema, not elsewhere classified: Secondary | ICD-10-CM | POA: Diagnosis not present

## 2018-04-30 ENCOUNTER — Ambulatory Visit (INDEPENDENT_AMBULATORY_CARE_PROVIDER_SITE_OTHER): Payer: Medicare Other | Admitting: Pharmacist

## 2018-04-30 ENCOUNTER — Encounter (INDEPENDENT_AMBULATORY_CARE_PROVIDER_SITE_OTHER): Payer: Self-pay

## 2018-04-30 DIAGNOSIS — I480 Paroxysmal atrial fibrillation: Secondary | ICD-10-CM

## 2018-04-30 DIAGNOSIS — Z5181 Encounter for therapeutic drug level monitoring: Secondary | ICD-10-CM

## 2018-04-30 LAB — POCT INR: INR: 2.6 (ref 2.0–3.0)

## 2018-04-30 NOTE — Patient Instructions (Signed)
Continue taking 1 tablet daily except 1.5 tablets on Mondays. Recheck in 3 weeks. Call if scheduled for any procedures or has any new medications  [phone].

## 2018-05-03 ENCOUNTER — Ambulatory Visit: Payer: Medicare Other | Admitting: Interventional Cardiology

## 2018-05-18 ENCOUNTER — Telehealth: Payer: Self-pay | Admitting: Pharmacist

## 2018-05-18 NOTE — Telephone Encounter (Signed)
Patien's wife called concerned about coming to her's and her husbands coumadin appointment on 3/20. Asking about home health/self testing. unfortuantly she does not have any current home health services and they will not come out for just an INR. Self testing can take months to set up.He and his wife are both candidates for Eliquis but per their daughter, they never meet their deductible, so do not think they can afford. I can give coupon for 1 month supply for free and possible and weeks worth of samples but depending on how long the COVID concerns last, this may not be enough. Would eventually need to pay deducible and monthly copays (I believe it would be $30/month). For now I have pushed back their appoinment 2 weeks. We are attempting to come up with a safe way for patients to have their INR checked (drive though checks). In 2 weeks we will reassess the situation.

## 2018-05-19 ENCOUNTER — Other Ambulatory Visit: Payer: Self-pay | Admitting: Interventional Cardiology

## 2018-05-28 ENCOUNTER — Telehealth: Payer: Self-pay | Admitting: Pharmacist

## 2018-05-28 NOTE — Telephone Encounter (Signed)

## 2018-06-01 ENCOUNTER — Other Ambulatory Visit: Payer: Self-pay

## 2018-06-01 ENCOUNTER — Other Ambulatory Visit: Payer: Self-pay | Admitting: *Deleted

## 2018-06-01 ENCOUNTER — Ambulatory Visit (INDEPENDENT_AMBULATORY_CARE_PROVIDER_SITE_OTHER): Payer: Medicare Other | Admitting: *Deleted

## 2018-06-01 DIAGNOSIS — Z5181 Encounter for therapeutic drug level monitoring: Secondary | ICD-10-CM | POA: Diagnosis not present

## 2018-06-01 DIAGNOSIS — I480 Paroxysmal atrial fibrillation: Secondary | ICD-10-CM | POA: Diagnosis not present

## 2018-06-01 LAB — POCT INR: INR: 3.2 — AB (ref 2.0–3.0)

## 2018-06-01 MED ORDER — APIXABAN 5 MG PO TABS: 5.0000 mg | ORAL_TABLET | Freq: Two times a day (BID) | ORAL | 1 refills | Status: DC

## 2018-07-13 ENCOUNTER — Telehealth: Payer: Self-pay

## 2018-07-13 NOTE — Telephone Encounter (Signed)

## 2018-07-14 ENCOUNTER — Ambulatory Visit (INDEPENDENT_AMBULATORY_CARE_PROVIDER_SITE_OTHER): Payer: Medicare Other | Admitting: Pharmacist

## 2018-07-14 ENCOUNTER — Other Ambulatory Visit: Payer: Self-pay

## 2018-07-14 DIAGNOSIS — Z5181 Encounter for therapeutic drug level monitoring: Secondary | ICD-10-CM | POA: Diagnosis not present

## 2018-07-14 DIAGNOSIS — I480 Paroxysmal atrial fibrillation: Secondary | ICD-10-CM | POA: Diagnosis not present

## 2018-07-14 LAB — POCT INR: INR: 3 (ref 2.0–3.0)

## 2018-07-27 ENCOUNTER — Telehealth: Payer: Self-pay | Admitting: Pharmacist

## 2018-07-27 DIAGNOSIS — Z191 Hormone sensitive malignancy status: Secondary | ICD-10-CM | POA: Diagnosis not present

## 2018-07-27 DIAGNOSIS — C61 Malignant neoplasm of prostate: Secondary | ICD-10-CM | POA: Diagnosis not present

## 2018-07-27 DIAGNOSIS — R600 Localized edema: Secondary | ICD-10-CM | POA: Diagnosis not present

## 2018-07-27 DIAGNOSIS — Z79899 Other long term (current) drug therapy: Secondary | ICD-10-CM | POA: Diagnosis not present

## 2018-07-27 DIAGNOSIS — Z7901 Long term (current) use of anticoagulants: Secondary | ICD-10-CM | POA: Diagnosis not present

## 2018-07-27 DIAGNOSIS — M7989 Other specified soft tissue disorders: Secondary | ICD-10-CM | POA: Diagnosis not present

## 2018-07-27 NOTE — Telephone Encounter (Signed)
Spoke with patient on 07/27/2018 to clarify current current cholesterol medication he is using as both simvastatin and atorvastatin are listed on his medication list. Patient states he is only taking atorvastatin and has not been taking simvastatin. Simvastatin has been deleted off of medication list now.

## 2018-08-04 ENCOUNTER — Telehealth: Payer: Self-pay

## 2018-08-04 NOTE — Telephone Encounter (Signed)
LMOM FOR PRESCREEN IN OFFICE AWARE

## 2018-08-06 ENCOUNTER — Telehealth: Payer: Self-pay | Admitting: *Deleted

## 2018-08-06 NOTE — Telephone Encounter (Signed)
1. COVID-19 Pre-Screening Questions:  . In the past 7 to 10 days have you had a cough,  shortness of breath, headache, congestion, fever (100 or greater) body aches, chills, sore throat, or sudden loss of taste or sense of smell? No . Have you been around anyone with known Covid 19. No . Have you been around anyone who is awaiting Covid 19 test results in the past 7 to 10 days? No . Have you been around anyone who has been exposed to Covid 19, or has mentioned symptoms of Covid 19 within the past 7 to 10 days? No    2. Pt advised of visitor restrictions (no visitors allowed except if needed to conduct the visit). Also advised to arrive at appointment time and wear a mask.   Visit changed from [facility] to [place]; address given to pt and phone number, too. Also, appt moved out 2 more weeks.

## 2018-08-24 ENCOUNTER — Ambulatory Visit (INDEPENDENT_AMBULATORY_CARE_PROVIDER_SITE_OTHER): Payer: Medicare Other | Admitting: *Deleted

## 2018-08-24 ENCOUNTER — Other Ambulatory Visit: Payer: Self-pay

## 2018-08-24 DIAGNOSIS — Z5181 Encounter for therapeutic drug level monitoring: Secondary | ICD-10-CM

## 2018-08-24 DIAGNOSIS — I480 Paroxysmal atrial fibrillation: Secondary | ICD-10-CM

## 2018-08-24 LAB — POCT INR: INR: 2 (ref 2.0–3.0)

## 2018-08-24 NOTE — Patient Instructions (Signed)
Description   Continue taking 1 tablet daily except 1.5 tablets on Mondays. Recheck in 4 weeks. Call if scheduled for any procedures or has any new medications  [phone].

## 2018-08-25 NOTE — Addendum Note (Signed)
Addended by: Derrel Nip B on: 08/25/2018 09:53 AM   Modules accepted: Orders

## 2018-09-06 ENCOUNTER — Other Ambulatory Visit: Payer: Self-pay | Admitting: Interventional Cardiology

## 2018-09-14 DIAGNOSIS — H9213 Otorrhea, bilateral: Secondary | ICD-10-CM | POA: Diagnosis not present

## 2018-09-14 DIAGNOSIS — H6983 Other specified disorders of Eustachian tube, bilateral: Secondary | ICD-10-CM | POA: Diagnosis not present

## 2018-09-14 DIAGNOSIS — H906 Mixed conductive and sensorineural hearing loss, bilateral: Secondary | ICD-10-CM | POA: Insufficient documentation

## 2018-09-14 DIAGNOSIS — H7291 Unspecified perforation of tympanic membrane, right ear: Secondary | ICD-10-CM | POA: Diagnosis not present

## 2018-09-16 ENCOUNTER — Other Ambulatory Visit: Payer: Self-pay

## 2018-09-16 MED ORDER — ATORVASTATIN CALCIUM 40 MG PO TABS: 40.0000 mg | ORAL_TABLET | Freq: Every day | ORAL | 3 refills | Status: DC

## 2018-09-17 DIAGNOSIS — H9213 Otorrhea, bilateral: Secondary | ICD-10-CM | POA: Diagnosis not present

## 2018-09-17 DIAGNOSIS — H6983 Other specified disorders of Eustachian tube, bilateral: Secondary | ICD-10-CM | POA: Diagnosis not present

## 2018-09-17 DIAGNOSIS — H7291 Unspecified perforation of tympanic membrane, right ear: Secondary | ICD-10-CM | POA: Diagnosis not present

## 2018-09-17 DIAGNOSIS — H906 Mixed conductive and sensorineural hearing loss, bilateral: Secondary | ICD-10-CM | POA: Diagnosis not present

## 2018-09-20 ENCOUNTER — Telehealth: Payer: Self-pay

## 2018-09-20 NOTE — Telephone Encounter (Signed)

## 2018-09-21 ENCOUNTER — Other Ambulatory Visit: Payer: Self-pay

## 2018-09-21 ENCOUNTER — Ambulatory Visit (INDEPENDENT_AMBULATORY_CARE_PROVIDER_SITE_OTHER): Payer: Medicare Other | Admitting: *Deleted

## 2018-09-21 DIAGNOSIS — I480 Paroxysmal atrial fibrillation: Secondary | ICD-10-CM | POA: Diagnosis not present

## 2018-09-21 DIAGNOSIS — Z5181 Encounter for therapeutic drug level monitoring: Secondary | ICD-10-CM | POA: Diagnosis not present

## 2018-09-21 LAB — POCT INR: INR: 3.5 — AB (ref 2.0–3.0)

## 2018-09-21 NOTE — Patient Instructions (Signed)
Description   Skip today's then start taking 1 tablet daily. Recheck in 2 weeks. Call if scheduled for any procedures or has any new medications  [phone].

## 2018-10-05 ENCOUNTER — Ambulatory Visit (INDEPENDENT_AMBULATORY_CARE_PROVIDER_SITE_OTHER): Payer: Medicare Other | Admitting: *Deleted

## 2018-10-05 ENCOUNTER — Other Ambulatory Visit: Payer: Self-pay

## 2018-10-05 DIAGNOSIS — L570 Actinic keratosis: Secondary | ICD-10-CM | POA: Diagnosis not present

## 2018-10-05 DIAGNOSIS — D485 Neoplasm of uncertain behavior of skin: Secondary | ICD-10-CM | POA: Diagnosis not present

## 2018-10-05 DIAGNOSIS — I480 Paroxysmal atrial fibrillation: Secondary | ICD-10-CM | POA: Diagnosis not present

## 2018-10-05 DIAGNOSIS — C4441 Basal cell carcinoma of skin of scalp and neck: Secondary | ICD-10-CM | POA: Diagnosis not present

## 2018-10-05 DIAGNOSIS — L821 Other seborrheic keratosis: Secondary | ICD-10-CM | POA: Diagnosis not present

## 2018-10-05 DIAGNOSIS — Z5181 Encounter for therapeutic drug level monitoring: Secondary | ICD-10-CM | POA: Diagnosis not present

## 2018-10-05 DIAGNOSIS — Z85828 Personal history of other malignant neoplasm of skin: Secondary | ICD-10-CM | POA: Diagnosis not present

## 2018-10-05 LAB — POCT INR: INR: 3 (ref 2.0–3.0)

## 2018-10-05 NOTE — Patient Instructions (Signed)
Description   Continue taking 1 tablet daily. Recheck in 4 weeks. Call if scheduled for any procedures or has any new medications  [phone].

## 2018-11-02 ENCOUNTER — Other Ambulatory Visit: Payer: Self-pay

## 2018-11-02 ENCOUNTER — Ambulatory Visit (INDEPENDENT_AMBULATORY_CARE_PROVIDER_SITE_OTHER): Payer: Medicare Other | Admitting: *Deleted

## 2018-11-02 DIAGNOSIS — Z5181 Encounter for therapeutic drug level monitoring: Secondary | ICD-10-CM

## 2018-11-02 DIAGNOSIS — I480 Paroxysmal atrial fibrillation: Secondary | ICD-10-CM | POA: Diagnosis not present

## 2018-11-02 LAB — POCT INR: INR: 3.1 — AB (ref 2.0–3.0)

## 2018-11-02 NOTE — Patient Instructions (Signed)
Description   Start  taking 1 tablet daily except 1/2 tablet on Tuesdays.  Recheck in 3 weeks. Call if scheduled for any procedures or has any new medications  [phone].

## 2018-11-23 ENCOUNTER — Ambulatory Visit (INDEPENDENT_AMBULATORY_CARE_PROVIDER_SITE_OTHER): Payer: Medicare Other | Admitting: Pharmacist Clinician (PhC)/ Clinical Pharmacy Specialist

## 2018-11-23 ENCOUNTER — Other Ambulatory Visit: Payer: Self-pay

## 2018-11-23 DIAGNOSIS — Z5181 Encounter for therapeutic drug level monitoring: Secondary | ICD-10-CM | POA: Diagnosis not present

## 2018-11-23 DIAGNOSIS — I480 Paroxysmal atrial fibrillation: Secondary | ICD-10-CM

## 2018-11-23 LAB — POCT INR: INR: 1.8 — AB (ref 2.0–3.0)

## 2018-12-14 ENCOUNTER — Other Ambulatory Visit: Payer: Self-pay

## 2018-12-14 ENCOUNTER — Ambulatory Visit (INDEPENDENT_AMBULATORY_CARE_PROVIDER_SITE_OTHER): Payer: Medicare Other | Admitting: Pharmacist Clinician (PhC)/ Clinical Pharmacy Specialist

## 2018-12-14 DIAGNOSIS — I480 Paroxysmal atrial fibrillation: Secondary | ICD-10-CM | POA: Diagnosis not present

## 2018-12-14 DIAGNOSIS — Z5181 Encounter for therapeutic drug level monitoring: Secondary | ICD-10-CM

## 2018-12-14 LAB — POCT INR: INR: 2.2 (ref 2.0–3.0)

## 2018-12-16 DIAGNOSIS — Z23 Encounter for immunization: Secondary | ICD-10-CM | POA: Diagnosis not present

## 2019-01-04 ENCOUNTER — Other Ambulatory Visit: Payer: Self-pay

## 2019-01-04 ENCOUNTER — Ambulatory Visit (INDEPENDENT_AMBULATORY_CARE_PROVIDER_SITE_OTHER): Payer: Medicare Other | Admitting: Pharmacist

## 2019-01-04 DIAGNOSIS — I480 Paroxysmal atrial fibrillation: Secondary | ICD-10-CM

## 2019-01-04 DIAGNOSIS — Z5181 Encounter for therapeutic drug level monitoring: Secondary | ICD-10-CM | POA: Diagnosis not present

## 2019-01-04 LAB — POCT INR: INR: 2 (ref 2.0–3.0)

## 2019-01-13 DIAGNOSIS — C61 Malignant neoplasm of prostate: Secondary | ICD-10-CM | POA: Diagnosis not present

## 2019-01-13 DIAGNOSIS — Z191 Hormone sensitive malignancy status: Secondary | ICD-10-CM | POA: Diagnosis not present

## 2019-01-13 DIAGNOSIS — Z79818 Long term (current) use of other agents affecting estrogen receptors and estrogen levels: Secondary | ICD-10-CM | POA: Diagnosis not present

## 2019-02-08 ENCOUNTER — Ambulatory Visit (INDEPENDENT_AMBULATORY_CARE_PROVIDER_SITE_OTHER): Payer: Medicare Other | Admitting: Pharmacist

## 2019-02-08 ENCOUNTER — Other Ambulatory Visit: Payer: Self-pay

## 2019-02-08 DIAGNOSIS — Z5181 Encounter for therapeutic drug level monitoring: Secondary | ICD-10-CM

## 2019-02-08 DIAGNOSIS — I480 Paroxysmal atrial fibrillation: Secondary | ICD-10-CM

## 2019-02-08 LAB — POCT INR: INR: 2.6 (ref 2.0–3.0)

## 2019-02-08 NOTE — Patient Instructions (Signed)
Description   Continue taking 1 tablet daily. Recheck in 6 weeks. Call if scheduled for any procedures or has any new medications  [phone].

## 2019-03-09 DIAGNOSIS — R05 Cough: Secondary | ICD-10-CM | POA: Diagnosis not present

## 2019-03-14 ENCOUNTER — Other Ambulatory Visit: Payer: Self-pay | Admitting: Nurse Practitioner

## 2019-03-14 DIAGNOSIS — I10 Essential (primary) hypertension: Secondary | ICD-10-CM

## 2019-03-14 DIAGNOSIS — U071 COVID-19: Secondary | ICD-10-CM

## 2019-03-14 NOTE — Progress Notes (Signed)
  I connected by phone with Robert [name] on 03/14/2019 at 10:59 AM to discuss the potential use of an new treatment for mild to moderate COVID-19 viral infection in non-hospitalized patients.  This patient is a 86 y.o. male that meets the FDA criteria for Emergency Use Authorization of bamlanivimab or casirivimab\imdevimab.  Has a (+) direct SARS-CoV-2 viral test result  Has mild or moderate COVID-19   Is ? 84 years of age and weighs ? 40 kg  Is NOT hospitalized due to COVID-19  Is NOT requiring oxygen therapy or requiring an increase in baseline oxygen flow rate due to COVID-19  Is within 10 days of symptom onset  Has at least one of the high risk factor(s) for progression to severe COVID-19 and/or hospitalization as defined in EUA.  Specific high risk criteria : Hypertension   I have spoken and communicated the following to the patient or parent/caregiver:  1. FDA has authorized the emergency use of bamlanivimab and casirivimab\imdevimab for the treatment of mild to moderate COVID-19 in adults and pediatric patients with positive results of direct SARS-CoV-2 viral testing who are 66 years of age and older weighing at least 40 kg, and who are at high risk for progressing to severe COVID-19 and/or hospitalization.  2. The significant known and potential risks and benefits of bamlanivimab and casirivimab\imdevimab, and the extent to which such potential risks and benefits are unknown.  3. Information on available alternative treatments and the risks and benefits of those alternatives, including clinical trials.  4. Patients treated with bamlanivimab and casirivimab\imdevimab should continue to self-isolate and use infection control measures (e.g., wear mask, isolate, social distance, avoid sharing personal items, clean and disinfect "high touch" surfaces, and frequent handwashing) according to CDC guidelines.   5. The patient or parent/caregiver has the option to accept or refuse  bamlanivimab or casirivimab\imdevimab .  After reviewing this information with the patient, The patient agreed to proceed with receiving the bamlanimivab infusion and will be provided a copy of the Fact sheet prior to receiving the infusion.Fenton Foy 03/14/2019 10:59 AM

## 2019-03-16 ENCOUNTER — Ambulatory Visit (HOSPITAL_COMMUNITY)
Admission: RE | Admit: 2019-03-16 | Discharge: 2019-03-16 | Disposition: A | Discharge status: Home / Self Care | Payer: Medicare Other | Source: Ambulatory Visit | Attending: Pulmonary Disease | Admitting: Pulmonary Disease

## 2019-03-16 DIAGNOSIS — I10 Essential (primary) hypertension: Secondary | ICD-10-CM | POA: Diagnosis not present

## 2019-03-16 DIAGNOSIS — U071 COVID-19: Secondary | ICD-10-CM | POA: Diagnosis not present

## 2019-03-16 DIAGNOSIS — Z23 Encounter for immunization: Secondary | ICD-10-CM | POA: Insufficient documentation

## 2019-03-16 MED ORDER — ALBUTEROL SULFATE HFA 108 (90 BASE) MCG/ACT IN AERS: 2.0000 | INHALATION_SPRAY | Freq: Once | RESPIRATORY_TRACT | Status: DC | PRN

## 2019-03-16 MED ORDER — EPINEPHRINE 0.3 MG/0.3ML IJ SOAJ: 0.3000 mg | Freq: Once | INTRAMUSCULAR | Status: DC | PRN

## 2019-03-16 MED ORDER — FAMOTIDINE IN NACL 20-0.9 MG/50ML-% IV SOLN: 20.0000 mg | Freq: Once | INTRAVENOUS | Status: DC | PRN

## 2019-03-16 MED ORDER — DIPHENHYDRAMINE HCL 50 MG/ML IJ SOLN: 50.0000 mg | Freq: Once | INTRAMUSCULAR | Status: DC | PRN

## 2019-03-16 MED ORDER — METHYLPREDNISOLONE SODIUM SUCC 125 MG IJ SOLR: 125.0000 mg | Freq: Once | INTRAMUSCULAR | Status: DC | PRN

## 2019-03-16 MED ORDER — SODIUM CHLORIDE 0.9 % IV SOLN
700.0000 mg | Freq: Once | INTRAVENOUS | Status: AC
  Administered 2019-03-16: 700 mg via INTRAVENOUS
  Filled 2019-03-16: qty 20

## 2019-03-16 MED ORDER — SODIUM CHLORIDE 0.9 % IV SOLN
INTRAVENOUS | Status: DC | PRN
  Administered 2019-03-16: 250 mL via INTRAVENOUS

## 2019-03-16 NOTE — Discharge Instructions (Signed)

## 2019-03-16 NOTE — Progress Notes (Signed)
  Diagnosis: COVID-19  Physician: Dr. Joya Gaskins  Procedure: [facility] Med: bamlanivimab infusion - Provided patient with bamlanimivab fact sheet for patients, parents and caregivers prior to infusion.  Complications: No immediate complications noted.  Discharge: Discharged home   Robert [name] Robert [name] 03/16/2019

## 2019-04-04 DIAGNOSIS — E559 Vitamin D deficiency, unspecified: Secondary | ICD-10-CM | POA: Diagnosis not present

## 2019-04-04 DIAGNOSIS — U071 COVID-19: Secondary | ICD-10-CM | POA: Diagnosis not present

## 2019-04-04 DIAGNOSIS — Z79899 Other long term (current) drug therapy: Secondary | ICD-10-CM | POA: Diagnosis not present

## 2019-04-04 DIAGNOSIS — I4891 Unspecified atrial fibrillation: Secondary | ICD-10-CM | POA: Diagnosis not present

## 2019-04-04 DIAGNOSIS — Z0000 Encounter for general adult medical examination without abnormal findings: Secondary | ICD-10-CM | POA: Diagnosis not present

## 2019-04-04 DIAGNOSIS — E785 Hyperlipidemia, unspecified: Secondary | ICD-10-CM | POA: Diagnosis not present

## 2019-04-04 DIAGNOSIS — Z952 Presence of prosthetic heart valve: Secondary | ICD-10-CM | POA: Diagnosis not present

## 2019-04-04 DIAGNOSIS — Z8546 Personal history of malignant neoplasm of prostate: Secondary | ICD-10-CM | POA: Diagnosis not present

## 2019-04-04 DIAGNOSIS — I509 Heart failure, unspecified: Secondary | ICD-10-CM | POA: Diagnosis not present

## 2019-04-04 DIAGNOSIS — D500 Iron deficiency anemia secondary to blood loss (chronic): Secondary | ICD-10-CM | POA: Diagnosis not present

## 2019-04-04 DIAGNOSIS — Z1389 Encounter for screening for other disorder: Secondary | ICD-10-CM | POA: Diagnosis not present

## 2019-04-04 DIAGNOSIS — I10 Essential (primary) hypertension: Secondary | ICD-10-CM | POA: Diagnosis not present

## 2019-04-04 DIAGNOSIS — D6869 Other thrombophilia: Secondary | ICD-10-CM | POA: Diagnosis not present

## 2019-04-04 DIAGNOSIS — I890 Lymphedema, not elsewhere classified: Secondary | ICD-10-CM | POA: Diagnosis not present

## 2019-04-05 ENCOUNTER — Ambulatory Visit (INDEPENDENT_AMBULATORY_CARE_PROVIDER_SITE_OTHER): Payer: Medicare Other | Admitting: Pharmacist

## 2019-04-05 ENCOUNTER — Other Ambulatory Visit: Payer: Self-pay

## 2019-04-05 DIAGNOSIS — Z5181 Encounter for therapeutic drug level monitoring: Secondary | ICD-10-CM

## 2019-04-05 DIAGNOSIS — I480 Paroxysmal atrial fibrillation: Secondary | ICD-10-CM

## 2019-04-05 LAB — POCT INR: INR: 2 (ref 2.0–3.0)

## 2019-04-05 NOTE — Patient Instructions (Signed)
Continue taking 1 tablet daily. Recheck in 6 weeks. Call if scheduled for any procedures or has any new medications  [phone]

## 2019-04-13 DIAGNOSIS — L821 Other seborrheic keratosis: Secondary | ICD-10-CM | POA: Diagnosis not present

## 2019-04-13 DIAGNOSIS — L570 Actinic keratosis: Secondary | ICD-10-CM | POA: Diagnosis not present

## 2019-04-13 DIAGNOSIS — Z85828 Personal history of other malignant neoplasm of skin: Secondary | ICD-10-CM | POA: Diagnosis not present

## 2019-04-18 ENCOUNTER — Other Ambulatory Visit: Payer: Self-pay | Admitting: Interventional Cardiology

## 2019-04-28 NOTE — Progress Notes (Signed)
Cardiology Office Note   Date:  04/29/2019   ID:  Robert [name], DOB [birth year], MRN [id number]  PCP:  Josetta Huddle, MD    No chief complaint on file.  CAD/ s/p AVR  Wt Readings from Last 3 Encounters:  04/29/19 177 lb (80.3 kg)  03/08/18 183 lb 12.8 oz (83.4 kg)  04/01/17 183 lb (83 kg)       History of Present Illness: Robert [name] is a 86 y.o. male  who had a BMS to the circumflex in 2011. He had AFib in 2/15. Workup revealed severe AS. He had AVR with single vessel CABG, Maze procedure and left atrial clip in 2015.Later in 2015, He had presyncope and his HR was in the 40s. Resolved with stopping beta blocker and digoxin. He did well for a while.   In 2/16, he had palpitations and went to the ER. He was in atrial flutter 2:1 block. He was sent home. He was started on metoprolol and Eliquis in the clinic the next day. Rhythm converted.   Eliquis was changed to COumadin due to cost reasons.  Chronic left ankle swellingwhichprogressed to the whole left leg in 2017, vein removed for CABG.It Was caused by lymph node blocking vein flow. Initally diagnosed with PrCA [id number]. Recent check showed PSA was elevated and has reduced with Lupron. Swelling is nearly completely resolved after Lupron. He had a w/u for DVT which was negative. Following with Dr. Rosana Hoes for his prostate cancer.Undetectable PSA in 12/18.  Occasional leg edema.  Left leg cellulitis in 8/19, that has been persistent.  U/s showed no DVT. Finished antibiotic course.   Since the last visit, he has done well.  He had Covid in January 2021.  He received an antibody infusion.  He recovered very well and had minimal symptoms.  Denies : Chest pain. Dizziness. Leg edema. Nitroglycerin use. Orthopnea. Palpitations. Paroxysmal nocturnal dyspnea. Shortness of breath. Syncope.   Chronic foot pain prevents exercise.  He feels like his joint pain gets worse when he takes his statin  regularly.  He has had some improvement when he holds his statin.  He did stop taking his statin for a while and his LDL increased to 156 back in early February 2021.   Past Medical History:  Diagnosis Date  . Aortic stenosis    a. s/p tissue AVR 05/2013;  b. Echo (06/2013):  Mod LVH, EF 60-65%, no RWMA, Gr 2 DD, AVR ok (mean 13 mmHg), MAC, mild MR, mod LAE, mild RAE, PASP 46 mmHg (mild pulmo HTN)  . Blood loss anemia    a. 05/2009 a/w GIB.  [name] CAD (coronary artery disease)    a. Cath 04/2009: BMS to LCx 04/2009; CTO of RCA with L-R collaterals, 40% ostial diag.  . Carpal tunnel syndrome    bilateral, carpal tunnel release x 2  . Esophageal ulcer    a. 05/2009 with hemorrhage - injected with epinephrine-Dr. Herbie Baltimore Buccini.  . Esophagitis    a. 05/2009 a/w GIB.  [name] GERD (gastroesophageal reflux disease)    EGD, Dr. Toney Rakes 2008  . GI bleed    a. 05/2009 as above: with associated anemia. Lower esophageal ulcer with extensive erosive esophagitis and large clot by EGD 05/2009.   [name] Hx of colonoscopy 2008   Dr. Toney Rakes, polyps- 5 yr f/u recommended  . Hyperlipidemia   . Hypertension   . PAF (paroxysmal atrial fibrillation) ([place])    a. Post-op AVR;  b. 04/2014 recurrent.  [name]  Perforated ear drum    right ear drum, tube in left ear drum, Dr. Constance Holster  . Polymyalgia rheumatica ([place])   . Prostate cancer ([place])   . Vitamin D deficiency     Past Surgical History:  Procedure Laterality Date  . AORTIC VALVE REPLACEMENT N/A 05/12/2013   Procedure: AORTIC VALVE REPLACEMENT (AVR);  Surgeon: Ivin Poot, MD;  Location: [facility];  Service: Open Heart Surgery;  Laterality: N/A;  . CARDIAC CATHETERIZATION  2011  . CORONARY ARTERY BYPASS GRAFT N/A 05/12/2013   Procedure: CORONARY ARTERY BYPASS GRAFTING (CABG);  Surgeon: Ivin Poot, MD;  Location: [facility];  Service: Open Heart Surgery;  Laterality: N/A;  CABG x 1 using left leg greater saphenous vein harvested endoscopically  . INTRAOPERATIVE  TRANSESOPHAGEAL ECHOCARDIOGRAM N/A 05/12/2013   Procedure: INTRAOPERATIVE TRANSESOPHAGEAL ECHOCARDIOGRAM;  Surgeon: Ivin Poot, MD;  Location: [facility];  Service: Open Heart Surgery;  Laterality: N/A;  . LEFT AND RIGHT HEART CATHETERIZATION WITH CORONARY ANGIOGRAM N/A 05/09/2013   Procedure: LEFT AND RIGHT HEART CATHETERIZATION WITH CORONARY ANGIOGRAM;  Surgeon: Lorretta Harp, MD;  Location: [facility] CATH LAB;  Service: Cardiovascular;  Laterality: N/A;  . MAZE N/A 05/12/2013   Procedure: MAZE;  Surgeon: Ivin Poot, MD;  Location: [facility];  Service: Open Heart Surgery;  Laterality: N/A;  . PROSTATECTOMY       Current Outpatient Medications  Medication Sig Dispense Refill  . Ascorbic Acid (VITAMIN C) 100 MG tablet Take 100 mg by mouth daily.    . cholecalciferol (VITAMIN D) 1000 UNITS tablet Take 1,000 Units by mouth daily.    [name] losartan (COZAAR) 50 MG tablet Take 1 tablet (50 mg total) by mouth daily. 90 tablet 3  . metoprolol succinate (TOPROL-XL) 50 MG 24 hr tablet Take 1 tablet (50 mg total) by mouth 2 (two) times daily. Please keep upcoming appt in February with Dr. Irish Lack before anymore refills. Thank you 180 tablet 0  . Omega-3 Fatty Acids (FISH OIL) 1000 MG CAPS Take 2 capsules by mouth daily.    [name] omeprazole (PRILOSEC) 20 MG capsule Take 20 mg by mouth daily.    . traMADol (ULTRAM) 50 MG tablet     . warfarin (COUMADIN) 5 MG tablet TAKE AS DIRECTED BY COUMADIN CLINIC 110 tablet 3  . rosuvastatin (CRESTOR) 20 MG tablet Take 1 tablet (20 mg total) by mouth daily. 90 tablet 3   No current facility-administered medications for this visit.    Allergies:   Lisinopril and Lasix [furosemide]    Social History:  The patient  reports that he has never smoked. He has never used smokeless tobacco. He reports that he does not drink alcohol or use drugs.   Family History:  The patient's family history includes Breast cancer in his daughter; CVA in his mother; Kidney disease in his father.     ROS:  Please see the history of present illness.   Otherwise, review of systems are positive for foot pain.   All other systems are reviewed and negative.    PHYSICAL EXAM: VS:  BP (!) 164/80   Pulse 96   Ht _0  (1.753 m)   Wt 177 lb (80.3 kg)   SpO2 96%   BMI 26.14 kg/m  , BMI Body mass index is 26.14 kg/m. GEN: Well nourished, well developed, in no acute distress  HEENT: normal  Neck: no JVD, carotid bruits, or masses Cardiac: Irregularly irregular; 2/6 murmurs, no rubs, or gallops,no edema  Respiratory:  clear to auscultation bilaterally, normal work of breathing GI: soft, nontender, nondistended, + BS MS: no deformity or atrophy  Skin: warm and dry, no rash Neuro:  Strength and sensation are intact Psych: euthymic mood, full affect   EKG:   The ekg ordered today demonstrates atrial fibrillation, rate controlled, PVC   Recent Labs: No results found for requested labs within last 8760 hours.   Lipid Panel    Component Value Date/Time   CHOL 119 07/09/2017 0824   TRIG 115 07/09/2017 0824   HDL 33 (L) 07/09/2017 0824   CHOLHDL 3.6 07/09/2017 0824   LDLCALC 63 07/09/2017 0824     Other studies Reviewed: Additional studies/ records that were reviewed today with results demonstrating: Labs from [facility] reviewed.   ASSESSMENT AND PLAN:  1. CAD: No angina.  Continue aggressive secondary prevention.  For his hyperlipidemia, will stop atorvastatin.  Start rosuvastatin 20 mg daily.  He has blood work checked at [facility] in May.  We will asked that he gets a lipid profile done at that time while he is fasting.  LDL target less than 100. 2. Status post aortic valve replacement: He needs SBE prophylaxis.  Dental procedures have not been an issue since he has dentures. 3. Atrial flutter: Warfarin for stroke prevention. 4. Hypertension: Well controlled at home. 5. Anticoagulated: Avoid falls.  No bleeding issues.  Hemoglobin was 11.6 in November 2020.   Current  medicines are reviewed at length with the patient today.  The patient concerns regarding his medicines were addressed.  The following changes have been made: Change in statin  Labs/ tests ordered today include:   Orders Placed This Encounter  Procedures  . EKG 12-Lead    Recommend 150 minutes/week of aerobic exercise Low fat, low carb, high fiber diet recommended  Disposition:   FU in 1 year   Signed, Larae Grooms, MD  04/29/2019 2:18 PM    [facility] [address], [place], [place]  [address] Phone: [phone]; Fax: [phone]

## 2019-04-29 ENCOUNTER — Ambulatory Visit (INDEPENDENT_AMBULATORY_CARE_PROVIDER_SITE_OTHER): Payer: Medicare Other | Admitting: Interventional Cardiology

## 2019-04-29 ENCOUNTER — Encounter: Payer: Self-pay | Admitting: Interventional Cardiology

## 2019-04-29 ENCOUNTER — Other Ambulatory Visit: Payer: Self-pay

## 2019-04-29 VITALS — BP 164/80 | HR 96 | Ht 69.0 in | Wt 177.0 lb

## 2019-04-29 DIAGNOSIS — I4821 Permanent atrial fibrillation: Secondary | ICD-10-CM | POA: Diagnosis not present

## 2019-04-29 DIAGNOSIS — I2510 Atherosclerotic heart disease of native coronary artery without angina pectoris: Secondary | ICD-10-CM

## 2019-04-29 DIAGNOSIS — Z952 Presence of prosthetic heart valve: Secondary | ICD-10-CM

## 2019-04-29 DIAGNOSIS — Z7901 Long term (current) use of anticoagulants: Secondary | ICD-10-CM

## 2019-04-29 DIAGNOSIS — I10 Essential (primary) hypertension: Secondary | ICD-10-CM

## 2019-04-29 MED ORDER — ROSUVASTATIN CALCIUM 20 MG PO TABS: 20.0000 mg | ORAL_TABLET | Freq: Every day | ORAL | 3 refills | Status: DC

## 2019-04-29 NOTE — Patient Instructions (Signed)
Medication Instructions:  Your physician has recommended you make the following change in your medication:   1. STOP: atorvastatin (lipitor)  2. START: START: rosuvastatin (crestor) 20 mg tablet: Take 1 tablet by mouth once a day  *If you need a refill on your cardiac medications before your next appointment, please call your pharmacy*   Lab Work: Your physician recommends that you have a FASTING lipid profile drawn with your Primary Care Doctor  If you have labs (blood work) drawn today and your tests are completely normal, you will receive your results only by: [name] MyChart Message (if you have MyChart) OR . A paper copy in the mail If you have any lab test that is abnormal or we need to change your treatment, we will call you to review the results.   Testing/Procedures: None ordered   Follow-Up: At [facility], you and your health needs are our priority.  As part of our continuing mission to provide you with exceptional heart care, we have created designated Provider Care Teams.  These Care Teams include your primary Cardiologist (physician) and Advanced Practice Providers (APPs -  Physician Assistants and Nurse Practitioners) who all work together to provide you with the care you need, when you need it.  We recommend signing up for the patient portal called "MyChart".  Sign up information is provided on this After Visit Summary.  MyChart is used to connect with patients for Virtual Visits (Telemedicine).  Patients are able to view lab/test results, encounter notes, upcoming appointments, etc.  Non-urgent messages can be sent to your provider as well.   To learn more about what you can do with MyChart, go to [email or web address].    Your next appointment:   12 month(s)  The format for your next appointment:   In Person  Provider:   You may see Larae Grooms, MD or one of the following Advanced Practice Providers on your designated Care Team:    Melina Copa,  PA-C  Ermalinda Barrios, PA-C    Other Instructions

## 2019-05-17 ENCOUNTER — Ambulatory Visit (INDEPENDENT_AMBULATORY_CARE_PROVIDER_SITE_OTHER): Payer: Medicare Other | Admitting: Pharmacist Clinician (PhC)/ Clinical Pharmacy Specialist

## 2019-05-17 ENCOUNTER — Other Ambulatory Visit: Payer: Self-pay

## 2019-05-17 DIAGNOSIS — Z5181 Encounter for therapeutic drug level monitoring: Secondary | ICD-10-CM

## 2019-05-17 DIAGNOSIS — I480 Paroxysmal atrial fibrillation: Secondary | ICD-10-CM

## 2019-05-17 LAB — POCT INR: INR: 1.6 — AB (ref 2.0–3.0)

## 2019-05-21 ENCOUNTER — Other Ambulatory Visit: Payer: Self-pay | Admitting: Interventional Cardiology

## 2019-06-07 ENCOUNTER — Other Ambulatory Visit: Payer: Self-pay

## 2019-06-07 ENCOUNTER — Ambulatory Visit (INDEPENDENT_AMBULATORY_CARE_PROVIDER_SITE_OTHER): Payer: Medicare Other | Admitting: *Deleted

## 2019-06-07 DIAGNOSIS — I480 Paroxysmal atrial fibrillation: Secondary | ICD-10-CM

## 2019-06-07 DIAGNOSIS — Z5181 Encounter for therapeutic drug level monitoring: Secondary | ICD-10-CM

## 2019-06-07 LAB — POCT INR: INR: 2.4 (ref 2.0–3.0)

## 2019-06-07 NOTE — Patient Instructions (Signed)
Continue warfarin 1 tablet daily. Recheck in 4 weeks. Call if scheduled for any procedures or has any new medications  [phone] 

## 2019-06-10 DIAGNOSIS — Z23 Encounter for immunization: Secondary | ICD-10-CM | POA: Diagnosis not present

## 2019-06-28 ENCOUNTER — Ambulatory Visit (INDEPENDENT_AMBULATORY_CARE_PROVIDER_SITE_OTHER): Payer: Medicare Other | Admitting: Pharmacist

## 2019-06-28 ENCOUNTER — Other Ambulatory Visit: Payer: Self-pay

## 2019-06-28 DIAGNOSIS — Z5181 Encounter for therapeutic drug level monitoring: Secondary | ICD-10-CM

## 2019-06-28 DIAGNOSIS — I480 Paroxysmal atrial fibrillation: Secondary | ICD-10-CM | POA: Diagnosis not present

## 2019-06-28 LAB — POCT INR: INR: 2.7 (ref 2.0–3.0)

## 2019-06-28 NOTE — Patient Instructions (Addendum)
Continue warfarin 1 tablet daily. Recheck in 5 weeks. Call if scheduled for any procedures or has any new medications  [phone]

## 2019-07-14 DIAGNOSIS — R600 Localized edema: Secondary | ICD-10-CM | POA: Diagnosis not present

## 2019-07-14 DIAGNOSIS — Z191 Hormone sensitive malignancy status: Secondary | ICD-10-CM | POA: Diagnosis not present

## 2019-07-14 DIAGNOSIS — Z9079 Acquired absence of other genital organ(s): Secondary | ICD-10-CM | POA: Diagnosis not present

## 2019-07-14 DIAGNOSIS — Z79899 Other long term (current) drug therapy: Secondary | ICD-10-CM | POA: Diagnosis not present

## 2019-07-14 DIAGNOSIS — Z8616 Personal history of COVID-19: Secondary | ICD-10-CM | POA: Diagnosis not present

## 2019-07-14 DIAGNOSIS — Z7901 Long term (current) use of anticoagulants: Secondary | ICD-10-CM | POA: Diagnosis not present

## 2019-07-14 DIAGNOSIS — Z599 Problem related to housing and economic circumstances, unspecified: Secondary | ICD-10-CM | POA: Diagnosis not present

## 2019-07-14 DIAGNOSIS — Z5111 Encounter for antineoplastic chemotherapy: Secondary | ICD-10-CM | POA: Diagnosis not present

## 2019-07-14 DIAGNOSIS — C61 Malignant neoplasm of prostate: Secondary | ICD-10-CM | POA: Diagnosis not present

## 2019-07-14 DIAGNOSIS — M7989 Other specified soft tissue disorders: Secondary | ICD-10-CM | POA: Diagnosis not present

## 2019-07-18 ENCOUNTER — Other Ambulatory Visit: Payer: Self-pay | Admitting: Interventional Cardiology

## 2019-08-02 ENCOUNTER — Other Ambulatory Visit: Payer: Self-pay

## 2019-08-02 ENCOUNTER — Ambulatory Visit (INDEPENDENT_AMBULATORY_CARE_PROVIDER_SITE_OTHER): Payer: Medicare Other | Admitting: Pharmacist

## 2019-08-02 DIAGNOSIS — Z5181 Encounter for therapeutic drug level monitoring: Secondary | ICD-10-CM

## 2019-08-02 DIAGNOSIS — I480 Paroxysmal atrial fibrillation: Secondary | ICD-10-CM

## 2019-08-02 LAB — POCT INR: INR: 2.2 (ref 2.0–3.0)

## 2019-08-02 NOTE — Patient Instructions (Signed)
Description   Continue warfarin 1 tablet daily. Recheck in 6 weeks. Call if scheduled for any procedures or has any new medications  [phone]

## 2019-08-10 ENCOUNTER — Other Ambulatory Visit: Payer: Self-pay | Admitting: Interventional Cardiology

## 2019-09-13 ENCOUNTER — Other Ambulatory Visit: Payer: Self-pay

## 2019-09-13 ENCOUNTER — Ambulatory Visit (INDEPENDENT_AMBULATORY_CARE_PROVIDER_SITE_OTHER): Payer: Medicare Other | Admitting: Pharmacist Clinician (PhC)/ Clinical Pharmacy Specialist

## 2019-09-13 DIAGNOSIS — I480 Paroxysmal atrial fibrillation: Secondary | ICD-10-CM | POA: Diagnosis not present

## 2019-09-13 DIAGNOSIS — Z5181 Encounter for therapeutic drug level monitoring: Secondary | ICD-10-CM

## 2019-09-13 LAB — POCT INR: INR: 2.5 (ref 2.0–3.0)

## 2019-09-13 NOTE — Patient Instructions (Signed)
Continue warfarin 1 tablet daily. Recheck in 6 weeks. Call if scheduled for any procedures or has any new medications  [phone]  

## 2019-10-17 DIAGNOSIS — D485 Neoplasm of uncertain behavior of skin: Secondary | ICD-10-CM | POA: Diagnosis not present

## 2019-10-17 DIAGNOSIS — L821 Other seborrheic keratosis: Secondary | ICD-10-CM | POA: Diagnosis not present

## 2019-10-17 DIAGNOSIS — C44622 Squamous cell carcinoma of skin of right upper limb, including shoulder: Secondary | ICD-10-CM | POA: Diagnosis not present

## 2019-10-17 DIAGNOSIS — L570 Actinic keratosis: Secondary | ICD-10-CM | POA: Diagnosis not present

## 2019-10-17 DIAGNOSIS — Z85828 Personal history of other malignant neoplasm of skin: Secondary | ICD-10-CM | POA: Diagnosis not present

## 2019-10-25 ENCOUNTER — Ambulatory Visit (INDEPENDENT_AMBULATORY_CARE_PROVIDER_SITE_OTHER): Payer: Medicare Other | Admitting: Pharmacist

## 2019-10-25 ENCOUNTER — Other Ambulatory Visit: Payer: Self-pay

## 2019-10-25 DIAGNOSIS — I480 Paroxysmal atrial fibrillation: Secondary | ICD-10-CM

## 2019-10-25 DIAGNOSIS — Z5181 Encounter for therapeutic drug level monitoring: Secondary | ICD-10-CM

## 2019-10-25 LAB — POCT INR: INR: 3 (ref 2.0–3.0)

## 2019-10-25 NOTE — Patient Instructions (Signed)
Continue warfarin 1 tablet daily. Recheck in 6 weeks. Call if scheduled for any procedures or has any new medications  [phone]  

## 2019-11-08 ENCOUNTER — Other Ambulatory Visit: Payer: Self-pay | Admitting: Interventional Cardiology

## 2019-12-06 ENCOUNTER — Other Ambulatory Visit: Payer: Self-pay

## 2019-12-06 ENCOUNTER — Ambulatory Visit (INDEPENDENT_AMBULATORY_CARE_PROVIDER_SITE_OTHER): Payer: Medicare Other

## 2019-12-06 DIAGNOSIS — I480 Paroxysmal atrial fibrillation: Secondary | ICD-10-CM

## 2019-12-06 DIAGNOSIS — Z5181 Encounter for therapeutic drug level monitoring: Secondary | ICD-10-CM

## 2019-12-06 LAB — POCT INR: INR: 2.8 (ref 2.0–3.0)

## 2019-12-06 NOTE — Patient Instructions (Signed)
Continue warfarin 1 tablet daily. Recheck in 6 weeks. Call if scheduled for any procedures or has any new medications  [phone]  

## 2020-01-03 DIAGNOSIS — M810 Age-related osteoporosis without current pathological fracture: Secondary | ICD-10-CM | POA: Diagnosis not present

## 2020-01-03 DIAGNOSIS — C61 Malignant neoplasm of prostate: Secondary | ICD-10-CM | POA: Diagnosis not present

## 2020-01-03 DIAGNOSIS — Z79818 Long term (current) use of other agents affecting estrogen receptors and estrogen levels: Secondary | ICD-10-CM | POA: Diagnosis not present

## 2020-01-04 DIAGNOSIS — Z23 Encounter for immunization: Secondary | ICD-10-CM | POA: Diagnosis not present

## 2020-01-10 DIAGNOSIS — Z191 Hormone sensitive malignancy status: Secondary | ICD-10-CM | POA: Diagnosis not present

## 2020-01-10 DIAGNOSIS — Z923 Personal history of irradiation: Secondary | ICD-10-CM | POA: Diagnosis not present

## 2020-01-10 DIAGNOSIS — Z8616 Personal history of COVID-19: Secondary | ICD-10-CM | POA: Diagnosis not present

## 2020-01-10 DIAGNOSIS — R600 Localized edema: Secondary | ICD-10-CM | POA: Diagnosis not present

## 2020-01-10 DIAGNOSIS — Z9079 Acquired absence of other genital organ(s): Secondary | ICD-10-CM | POA: Diagnosis not present

## 2020-01-10 DIAGNOSIS — R918 Other nonspecific abnormal finding of lung field: Secondary | ICD-10-CM | POA: Diagnosis not present

## 2020-01-10 DIAGNOSIS — Z599 Problem related to housing and economic circumstances, unspecified: Secondary | ICD-10-CM | POA: Diagnosis not present

## 2020-01-10 DIAGNOSIS — M7989 Other specified soft tissue disorders: Secondary | ICD-10-CM | POA: Diagnosis not present

## 2020-01-10 DIAGNOSIS — Z5111 Encounter for antineoplastic chemotherapy: Secondary | ICD-10-CM | POA: Diagnosis not present

## 2020-01-10 DIAGNOSIS — C7951 Secondary malignant neoplasm of bone: Secondary | ICD-10-CM | POA: Diagnosis not present

## 2020-01-10 DIAGNOSIS — C61 Malignant neoplasm of prostate: Secondary | ICD-10-CM | POA: Diagnosis not present

## 2020-01-10 DIAGNOSIS — Z7901 Long term (current) use of anticoagulants: Secondary | ICD-10-CM | POA: Diagnosis not present

## 2020-01-10 DIAGNOSIS — M810 Age-related osteoporosis without current pathological fracture: Secondary | ICD-10-CM | POA: Diagnosis not present

## 2020-01-10 DIAGNOSIS — Z79899 Other long term (current) drug therapy: Secondary | ICD-10-CM | POA: Diagnosis not present

## 2020-01-17 ENCOUNTER — Other Ambulatory Visit: Payer: Self-pay

## 2020-01-17 ENCOUNTER — Ambulatory Visit (INDEPENDENT_AMBULATORY_CARE_PROVIDER_SITE_OTHER): Payer: Medicare Other

## 2020-01-17 DIAGNOSIS — Z5181 Encounter for therapeutic drug level monitoring: Secondary | ICD-10-CM | POA: Diagnosis not present

## 2020-01-17 DIAGNOSIS — I480 Paroxysmal atrial fibrillation: Secondary | ICD-10-CM

## 2020-01-17 DIAGNOSIS — Z23 Encounter for immunization: Secondary | ICD-10-CM | POA: Diagnosis not present

## 2020-01-17 LAB — POCT INR: INR: 3 (ref 2.0–3.0)

## 2020-01-17 NOTE — Patient Instructions (Signed)
Continue warfarin 1 tablet daily. Recheck in 4 weeks. Call if scheduled for any procedures or has any new medications  [phone]

## 2020-02-14 ENCOUNTER — Other Ambulatory Visit: Payer: Self-pay

## 2020-02-14 ENCOUNTER — Ambulatory Visit (INDEPENDENT_AMBULATORY_CARE_PROVIDER_SITE_OTHER): Payer: Medicare Other

## 2020-02-14 DIAGNOSIS — Z5181 Encounter for therapeutic drug level monitoring: Secondary | ICD-10-CM | POA: Diagnosis not present

## 2020-02-14 DIAGNOSIS — I480 Paroxysmal atrial fibrillation: Secondary | ICD-10-CM

## 2020-02-14 LAB — POCT INR: INR: 2.5 (ref 2.0–3.0)

## 2020-02-14 NOTE — Patient Instructions (Signed)
Continue warfarin 1 tablet daily. Recheck in 6 weeks. Call if scheduled for any procedures or has any new medications  [phone]

## 2020-02-23 DIAGNOSIS — H26493 Other secondary cataract, bilateral: Secondary | ICD-10-CM | POA: Diagnosis not present

## 2020-02-23 DIAGNOSIS — H353111 Nonexudative age-related macular degeneration, right eye, early dry stage: Secondary | ICD-10-CM | POA: Diagnosis not present

## 2020-02-23 DIAGNOSIS — Z961 Presence of intraocular lens: Secondary | ICD-10-CM | POA: Diagnosis not present

## 2020-02-29 DIAGNOSIS — J209 Acute bronchitis, unspecified: Secondary | ICD-10-CM | POA: Diagnosis not present

## 2020-02-29 DIAGNOSIS — Z20822 Contact with and (suspected) exposure to covid-19: Secondary | ICD-10-CM | POA: Diagnosis not present

## 2020-03-27 ENCOUNTER — Other Ambulatory Visit: Payer: Self-pay

## 2020-03-27 ENCOUNTER — Ambulatory Visit (INDEPENDENT_AMBULATORY_CARE_PROVIDER_SITE_OTHER): Payer: Medicare Other

## 2020-03-27 DIAGNOSIS — Z5181 Encounter for therapeutic drug level monitoring: Secondary | ICD-10-CM | POA: Diagnosis not present

## 2020-03-27 DIAGNOSIS — I480 Paroxysmal atrial fibrillation: Secondary | ICD-10-CM

## 2020-03-27 LAB — POCT INR: INR: 2.5 (ref 2.0–3.0)

## 2020-03-27 NOTE — Patient Instructions (Signed)
Continue warfarin 1 tablet daily. Recheck in 6 weeks. Call if scheduled for any procedures or has any new medications  [phone]  

## 2020-04-03 ENCOUNTER — Telehealth: Payer: Self-pay | Admitting: Interventional Cardiology

## 2020-04-03 DIAGNOSIS — H26491 Other secondary cataract, right eye: Secondary | ICD-10-CM | POA: Diagnosis not present

## 2020-04-03 NOTE — Telephone Encounter (Cosign Needed)
New message:     Patient calling stating that he is in AFIB abiut 3 weeks now and would like to have a apt and have a nurse call him back.

## 2020-04-03 NOTE — Telephone Encounter (Signed)
In the past, he has had presyncope and HR in the 40s, which resolved with decreasing rate control meds.  May need consideration of pacemaker due to tachy-brady.

## 2020-04-03 NOTE — Telephone Encounter (Signed)
I spoke with patient.  He reports he has been in afib for about 3 weeks.  Rate 120-130.  BP 119/74 today.  He uses BP cuff and pulse ox to check heart rate.  He is feeling fine. Taking coumadin.  I scheduled patient to see Malka So, PA on 04/04/20 at 1:30 in afib clinic

## 2020-04-04 ENCOUNTER — Encounter (HOSPITAL_COMMUNITY): Payer: Self-pay | Admitting: Physician Assistant

## 2020-04-04 ENCOUNTER — Telehealth: Payer: Self-pay

## 2020-04-04 ENCOUNTER — Other Ambulatory Visit: Payer: Self-pay

## 2020-04-04 ENCOUNTER — Ambulatory Visit (HOSPITAL_COMMUNITY)
Admission: RE | Admit: 2020-04-04 | Discharge: 2020-04-04 | Disposition: A | Discharge status: Home / Self Care | Payer: Medicare Other | Source: Ambulatory Visit | Attending: Physician Assistant | Admitting: Physician Assistant

## 2020-04-04 VITALS — BP 158/90 | HR 124 | Ht 69.0 in | Wt 176.0 lb

## 2020-04-04 DIAGNOSIS — E785 Hyperlipidemia, unspecified: Secondary | ICD-10-CM | POA: Insufficient documentation

## 2020-04-04 DIAGNOSIS — I2510 Atherosclerotic heart disease of native coronary artery without angina pectoris: Secondary | ICD-10-CM | POA: Diagnosis not present

## 2020-04-04 DIAGNOSIS — Z951 Presence of aortocoronary bypass graft: Secondary | ICD-10-CM | POA: Diagnosis not present

## 2020-04-04 DIAGNOSIS — Z952 Presence of prosthetic heart valve: Secondary | ICD-10-CM | POA: Diagnosis not present

## 2020-04-04 DIAGNOSIS — I10 Essential (primary) hypertension: Secondary | ICD-10-CM | POA: Insufficient documentation

## 2020-04-04 DIAGNOSIS — I4819 Other persistent atrial fibrillation: Secondary | ICD-10-CM | POA: Insufficient documentation

## 2020-04-04 DIAGNOSIS — Z7901 Long term (current) use of anticoagulants: Secondary | ICD-10-CM | POA: Diagnosis not present

## 2020-04-04 DIAGNOSIS — D6869 Other thrombophilia: Secondary | ICD-10-CM | POA: Insufficient documentation

## 2020-04-04 DIAGNOSIS — I340 Nonrheumatic mitral (valve) insufficiency: Secondary | ICD-10-CM | POA: Insufficient documentation

## 2020-04-04 DIAGNOSIS — I4892 Unspecified atrial flutter: Secondary | ICD-10-CM | POA: Insufficient documentation

## 2020-04-04 LAB — BASIC METABOLIC PANEL
Anion gap: 11 (ref 5–15)
BUN: 17 mg/dL (ref 8–23)
CO2: 24 mmol/L (ref 22–32)
Calcium: 9.3 mg/dL (ref 8.9–10.3)
Chloride: 105 mmol/L (ref 98–111)
Creatinine, Ser: 0.91 mg/dL (ref 0.61–1.24)
GFR, Estimated: >60 mL/min (ref >60)
Glucose, Bld: 103 mg/dL — ABNORMAL HIGH (ref 70–99)
Potassium: 4.8 mmol/L (ref 3.5–5.1)
Sodium: 140 mmol/L (ref 135–145)

## 2020-04-04 LAB — CBC
HCT: 31.8 % — ABNORMAL LOW (ref 39.0–52.0)
Hemoglobin: 10 g/dL — ABNORMAL LOW (ref 13.0–17.0)
MCH: 29.6 pg (ref 26.0–34.0)
MCHC: 31.4 g/dL (ref 30.0–36.0)
MCV: 94.1 fL (ref 80.0–100.0)
Platelets: 142 10*3/uL — ABNORMAL LOW (ref 150–400)
RBC: 3.38 MIL/uL — ABNORMAL LOW (ref 4.22–5.81)
RDW: 15.9 % — ABNORMAL HIGH (ref 11.5–15.5)
WBC: 7.7 10*3/uL (ref 4.0–10.5)
nRBC: 0 % (ref 0.0–0.2)

## 2020-04-04 NOTE — Progress Notes (Signed)
Due to national recommendations of social distancing due to [place] 19, Audio/video telehealth visit is felt to be most appropriate for this patient at this time.  See consent below from today for patient consent regarding telehealth for the At[facility]illation Clinic.    Date:  04/04/2020   ID:  Robert [name], DOB 30-May-1927, MRN [id number]  Location: [facility] Provider location: [address] [place], [place] [address] Evaluation Performed: Follow up   Primary Care Physician: Josetta Huddle, MD Primary Cardiologist: Dr Irish Lack  Primary Electrophysiologist: none Referring Physician: Dr Alda Berthold is a 87 y.o. male with a history of CAD s/p CABG and MAZE, aortic stenosis s/p AVR, prior GI bleed 2011, HLD, HTN, polymyalgia rheumatica, and atrial fibrillation who presents for consultation in the [facility].  The patient was initially diagnosed with atrial fibrillation in remotely and was diagnosed with atrial flutter 04/2014 after presenting with symptoms of heart racing. He has been maintained on metoprolol. Patient is on warfarin for a CHADS2VASC score of 4. He was in his usual state of health until about 3 weeks ago when he started having palpitations, intermittent dizziness, and dyspnea with exertion. ECG today shows rapid afib. There were no specific triggers that he could identify.   Today, he denies symptoms of chest pain, shortness of breath, orthopnea, PND, lower extremity edema, presyncope, syncope, snoring, daytime somnolence, bleeding, or neurologic sequela. The patient is tolerating medications without difficulties and is otherwise without complaint today.    Atrial Fibrillation Risk Factors:  he does not have symptoms or diagnosis of sleep apnea. he does not have a history of rheumatic fever.   he has a BMI of Body mass index is 25.99 kg/m.[name] Filed Weights   04/04/20 1329  Weight: 79.8 kg    Family History   Problem Relation Age of Onset  . CVA Mother   . Kidney disease Father   . Breast cancer Daughter      Atrial Fibrillation Management history:  Previous antiarrhythmic drugs:amiodarone Previous cardioversions: none Previous ablations: MAZE 2015 CHADS2VASC score: 4 Anticoagulation history: warfarin   Past Medical History:  Diagnosis Date  . Aortic stenosis    a. s/p tissue AVR 05/2013;  b. Echo (06/2013):  Mod LVH, EF 60-65%, no RWMA, Gr 2 DD, AVR ok (mean 13 mmHg), MAC, mild MR, mod LAE, mild RAE, PASP 46 mmHg (mild pulmo HTN)  . Blood loss anemia    a. 05/2009 a/w GIB.  [name] CAD (coronary artery disease)    a. Cath 04/2009: BMS to LCx 04/2009; CTO of RCA with L-R collaterals, 40% ostial diag.  . Carpal tunnel syndrome    bilateral, carpal tunnel release x 2  . Esophageal ulcer    a. 05/2009 with hemorrhage - injected with epinephrine-Dr. Herbie Baltimore Buccini.  . Esophagitis    a. 05/2009 a/w GIB.  [name] GERD (gastroesophageal reflux disease)    EGD, Dr. Toney Rakes 2008  . GI bleed    a. 05/2009 as above: with associated anemia. Lower esophageal ulcer with extensive erosive esophagitis and large clot by EGD 05/2009.   [name] Hx of colonoscopy 2008   Dr. Toney Rakes, polyps- 5 yr f/u recommended  . Hyperlipidemia   . Hypertension   . PAF (paroxysmal atrial fibrillation) ([place])    a. Post-op AVR;  b. 04/2014 recurrent.  . Perforated ear drum    right ear drum, tube in left ear drum, Dr. Constance Holster  . Polymyalgia rheumatica ([place])   .  Prostate cancer (HCC)   . Vitamin D deficiency    Past Surgical History:  Procedure Laterality Date  . AORTIC VALVE REPLACEMENT N/A 05/12/2013   Procedure: AORTIC VALVE REPLACEMENT (AVR);  Surgeon: Peter Van Trigt, MD;  Location: [facility];  Service: Open Heart Surgery;  Laterality: N/A;  . CARDIAC CATHETERIZATION  2011  . CORONARY ARTERY BYPASS GRAFT N/A 05/12/2013   Procedure: CORONARY ARTERY BYPASS GRAFTING (CABG);  Surgeon: Peter Van Trigt, MD;  Location: [facility] OR;   Service: Open Heart Surgery;  Laterality: N/A;  CABG x 1 using left leg greater saphenous vein harvested endoscopically  . INTRAOPERATIVE TRANSESOPHAGEAL ECHOCARDIOGRAM N/A 05/12/2013   Procedure: INTRAOPERATIVE TRANSESOPHAGEAL ECHOCARDIOGRAM;  Surgeon: Peter Van Trigt, MD;  Location: [facility];  Service: Open Heart Surgery;  Laterality: N/A;  . LEFT AND RIGHT HEART CATHETERIZATION WITH CORONARY ANGIOGRAM N/A 05/09/2013   Procedure: LEFT AND RIGHT HEART CATHETERIZATION WITH CORONARY ANGIOGRAM;  Surgeon: Jonathan J Berry, MD;  Location: [facility] CATH LAB;  Service: Cardiovascular;  Laterality: N/A;  . MAZE N/A 05/12/2013   Procedure: MAZE;  Surgeon: Peter Van Trigt, MD;  [facility];  Service: Open Heart Surgery;  Laterality: N/A;  . PROSTATECTOMY      Current Outpatient Medications  Medication Sig Dispense Refill  . Ascorbic Acid (VITAMIN C) 100 MG tablet Take 100 mg by mouth daily.    . calcium citrate-vitamin D (CITRACAL+D) 315-200 MG-UNIT tablet Take 1 tablet by mouth 2 (two) times daily.    . cholecalciferol (VITAMIN D) 1000 UNITS tablet Take 1,000 Units by mouth daily.    . losartan (COZAAR) 50 MG tablet TAKE 1 TABLET DAILY 90 tablet 3  . metoprolol succinate (TOPROL-XL) 50 MG 24 hr tablet Take 1 tablet (50 mg total) by mouth in the morning and at bedtime. 180 tablet 3  . omeprazole (PRILOSEC) 20 MG capsule Take 20 mg by mouth daily.    . Potassium Chloride ER 20 MEQ TBCR 1 tablet with food    . rosuvastatin (CRESTOR) 20 MG tablet Take 1 tablet (20 mg total) by mouth daily. 90 tablet 3  . warfarin (COUMADIN) 5 MG tablet Take 1 tablet by mouth daily as directed by the coumadin clinic. 100 tablet 1   No current facility-administered medications for this encounter.    Allergies  Allergen Reactions  . Lisinopril Cough and Other (See Comments)  . Amoxicillin     Other reaction(s): penile swelling  . Kenalog [Triamcinolone]     Other reaction(s): rash  . Lasix [Furosemide] Other (See Comments)     Pt is unaware of reaction    Social History   Socioeconomic History  . Marital status: Married    Spouse name: Not on file  . Number of children: Not on file  . Years of education: Not on file  . Highest education level: Not on file  Occupational History  . Not on file  Tobacco Use  . Smoking status: Never Smoker  . Smokeless tobacco: Never Used  Substance and Sexual Activity  . Alcohol use: No  . Drug use: No  . Sexual activity: Not on file  Other Topics Concern  . Not on file  Social History Narrative  . Not on file   Social Determinants of Health   Financial Resource Strain: Not on file  Food Insecurity: Not on file  Transportation Needs: Not on file  Physical Activity: Not on file  Stress: Not on file  Social Connections: Not on file  Intimate Partner Violence:   Not on file     ROS- All systems are reviewed and negative except as per the HPI above.  Physical Exam: Vitals:   04/04/20 1329  BP: (!) 158/90  Pulse: (!) 124  Weight: 79.8 kg  Height: _0  (1.753 m)    Well appearing, alert and conversant, regular work of breathing,  good skin color Eyes- anicteric, neuro- grossly intact, skin- no apparent rash or lesions or cyanosis, mouth- oral mucosa is pink   Wt Readings from Last 3 Encounters:  04/04/20 79.8 kg  04/29/19 80.3 kg  03/08/18 83.4 kg    EKG today demonstrates  Afib Vent. rate 124 BPM QRS duration 76 ms QT/QTc 334/479 ms   Echo 06/21/13 demonstrated  - Left ventricle: The cavity size was normal. There was  moderate concentric hypertrophy. Systolic function was  normal. The estimated ejection fraction was in the range  of 60% to 65%. Wall motion was normal; there were no  regional wall motion abnormalities. Features are  consistent with a pseudonormal left ventricular filling  pattern, with concomitant abnormal relaxation and  increased filling pressure (grade 2 diastolic  dysfunction).  - Aortic valve:  Bioprosthetic aortic valve. No significant  bioprosthetic valve stenosis. Mean gradient: 71m Hg (S).  Peak gradient: 268mHg (S).  - Mitral valve: Mildly calcified annulus. Mildly calcified  leaflets . Mild regurgitation.  - Left atrium: The atrium was moderately dilated.  - Right ventricle: The cavity size was normal. Systolic  function was normal.  - Right atrium: The atrium was mildly dilated.  - Pulmonary arteries: PA peak pressure: 4646mg (S).  - Systemic veins: IVC measured 2.4 cm with normal  respirophasic variation, suggesting RA pressure 8 mmHg.  Impressions:    Epic records are reviewed at length today  CHA2DS2-VASc Score = 4  The patient's score is based upon: CHF History: No HTN History: Yes Diabetes History: No Stroke History: No Vascular Disease History: Yes Age Score: 2 Gender Score: 0      ASSESSMENT AND PLAN: 1. Persistent Atrial Fibrillation/atrial flutter The patient's CHA2DS2-VASc score is 4, indicating a 4.8% annual risk of stroke.   Patient in persistent symptomatic afib.  We discussed therapeutic options today. Given his rapid rates and symptoms, will pursue TEE guided DCCV instead of waiting for 4 therapeutic INRs. Patient in agreement with plan.  Patient does have a history of significant bradycardia on higher doses of rate control with amiodarone. Possible tachybrady, may need to consider PPM. Since this is his first occurrence in ~5-6 years, will see how he does post DCCV. Continue warfarin   2. Secondary Hypercoagulable State (ICD10:  D68.69) The patient is at significant risk for stroke/thromboembolism based upon his CHA2DS2-VASc Score of 4.  Continue Warfarin (Coumadin).   3. HTN Stable, no changes today.  5. CAD S/p CABG No anginal symptoms.  6. Aortic stenosis S/p AVR with bioprosthetic valve 2015   Follow up in the AF clinic one week post DCCV.   [facility]eChowchillaC [id number]/2022 1:35 PM

## 2020-04-04 NOTE — Patient Instructions (Signed)
Cardioversion scheduled for Thursday, February 10th  - You will need INR check prior to arrive - the coumadin clinic will call you with a time  - Arrive at the [facility] and go to admitting at [facility] not eat or drink anything after midnight the night prior to your procedure.  - Take all your morning medication (except diabetic medications) with a sip of water prior to arrival.  - You will not be able to drive home after your procedure.  - Do NOT miss any doses of your blood thinner - if you should miss a dose please notify our office immediately.  - If you feel as if you go back into normal rhythm prior to scheduled cardioversion, please notify our office immediately. If your procedure is canceled in the cardioversion suite you will be charged a cancellation fee.

## 2020-04-04 NOTE — Telephone Encounter (Signed)
I left the patient a message to call and schedule INR draw for the morning of 2/10, prior to his TEE at the hospital.

## 2020-04-04 NOTE — H&P (View-Only) (Signed)
Due to national recommendations of social distancing due to [place] 19, Audio/video telehealth visit is felt to be most appropriate for this patient at this time.  See consent below from today for patient consent regarding telehealth for the Atrial Fibrillation Clinic.    Date:  04/04/2020   ID:  Robert [name], DOB February 09, 1928, MRN [id number]  Location: [facility] Clinic Provider location: [address] [place], [place] [address] Evaluation Performed: Follow up   Primary Care Physician: Robert Huddle, MD Primary Cardiologist: Dr Robert [name]  Primary Electrophysiologist: none Referring Physician: Dr Robert [name] is a 87 y.o. male with a history of CAD s/p CABG and MAZE, aortic stenosis s/p AVR, prior GI bleed 2011, HLD, HTN, polymyalgia rheumatica, and atrial fibrillation who presents for consultation in the [facility].  The patient was initially diagnosed with atrial fibrillation in remotely and was diagnosed with atrial flutter 04/2014 after presenting with symptoms of heart racing. He has been maintained on metoprolol. Patient is on warfarin for a CHADS2VASC score of 4. He was in his usual state of health until about 3 weeks ago when he started having palpitations, intermittent dizziness, and dyspnea with exertion. ECG today shows rapid afib. There were no specific triggers that he could identify.   Today, he denies symptoms of chest pain, shortness of breath, orthopnea, PND, lower extremity edema, presyncope, syncope, snoring, daytime somnolence, bleeding, or neurologic sequela. The patient is tolerating medications without difficulties and is otherwise without complaint today.    Atrial Fibrillation Risk Factors:  he does not have symptoms or diagnosis of sleep apnea. he does not have a history of rheumatic fever.   he has a BMI of Body mass index is 25.99 kg/m.[name] Filed Weights   04/04/20 1329  Weight: 79.8 kg    Family History   Problem Relation Age of Onset  . CVA Mother   . Kidney disease Father   . Breast cancer Daughter      Atrial Fibrillation Management history:  Previous antiarrhythmic drugs:amiodarone Previous cardioversions: none Previous ablations: MAZE 2015 CHADS2VASC score: 4 Anticoagulation history: warfarin   Past Medical History:  Diagnosis Date  . Aortic stenosis    a. s/p tissue AVR 05/2013;  b. Echo (06/2013):  Mod LVH, EF 60-65%, no RWMA, Gr 2 DD, AVR ok (mean 13 mmHg), MAC, mild MR, mod LAE, mild RAE, PASP 46 mmHg (mild pulmo HTN)  . Blood loss anemia    a. 05/2009 a/w GIB.  [name] CAD (coronary artery disease)    a. Cath 04/2009: BMS to LCx 04/2009; CTO of RCA with L-R collaterals, 40% ostial diag.  . Carpal tunnel syndrome    bilateral, carpal tunnel release x 2  . Esophageal ulcer    a. 05/2009 with hemorrhage - injected with epinephrine-Dr. Herbie Baltimore [name].  . Esophagitis    a. 05/2009 a/w GIB.  [name] GERD (gastroesophageal reflux disease)    EGD, Dr. Toney [name] 2008  . GI bleed    a. 05/2009 as above: with associated anemia. Lower esophageal ulcer with extensive erosive esophagitis and large clot by EGD 05/2009.   [name] Hx of colonoscopy 2008   Dr. Toney [name], polyps- 5 yr f/u recommended  . Hyperlipidemia   . Hypertension   . PAF (paroxysmal atrial fibrillation) ([place])    a. Post-op AVR;  b. 04/2014 recurrent.  . Perforated ear drum    right ear drum, tube in left ear drum, Dr. Constance [name]  . Polymyalgia rheumatica ([place])   .  Prostate cancer ([place])   . Vitamin D deficiency    Past Surgical History:  Procedure Laterality Date  . AORTIC VALVE REPLACEMENT N/A 05/12/2013   Procedure: AORTIC VALVE REPLACEMENT (AVR);  Surgeon: Robert Poot, MD;  Location: [facility];  Service: Open Heart Surgery;  Laterality: N/A;  . CARDIAC CATHETERIZATION  2011  . CORONARY ARTERY BYPASS GRAFT N/A 05/12/2013   Procedure: CORONARY ARTERY BYPASS GRAFTING (CABG);  Surgeon: Robert Poot, MD;  Location: [facility];   Service: Open Heart Surgery;  Laterality: N/A;  CABG x 1 using left leg greater saphenous vein harvested endoscopically  . INTRAOPERATIVE TRANSESOPHAGEAL ECHOCARDIOGRAM N/A 05/12/2013   Procedure: INTRAOPERATIVE TRANSESOPHAGEAL ECHOCARDIOGRAM;  Surgeon: Robert Poot, MD;  Location: [facility];  Service: Open Heart Surgery;  Laterality: N/A;  . LEFT AND RIGHT HEART CATHETERIZATION WITH CORONARY ANGIOGRAM N/A 05/09/2013   Procedure: LEFT AND RIGHT HEART CATHETERIZATION WITH CORONARY ANGIOGRAM;  Surgeon: Robert Harp, MD;  [facility] CATH LAB;  Service: Cardiovascular;  Laterality: N/A;  . MAZE N/A 05/12/2013   Procedure: MAZE;  Surgeon: Robert Poot, MD;  Location: [facility];  Service: Open Heart Surgery;  Laterality: N/A;  . PROSTATECTOMY      Current Outpatient Medications  Medication Sig Dispense Refill  . Ascorbic Acid (VITAMIN C) 100 MG tablet Take 100 mg by mouth daily.    . calcium citrate-vitamin D (CITRACAL+D) 315-200 MG-UNIT tablet Take 1 tablet by mouth 2 (two) times daily.    . cholecalciferol (VITAMIN D) 1000 UNITS tablet Take 1,000 Units by mouth daily.    [name] losartan (COZAAR) 50 MG tablet TAKE 1 TABLET DAILY 90 tablet 3  . metoprolol succinate (TOPROL-XL) 50 MG 24 hr tablet Take 1 tablet (50 mg total) by mouth in the morning and at bedtime. 180 tablet 3  . omeprazole (PRILOSEC) 20 MG capsule Take 20 mg by mouth daily.    . Potassium Chloride ER 20 MEQ TBCR 1 tablet with food    . rosuvastatin (CRESTOR) 20 MG tablet Take 1 tablet (20 mg total) by mouth daily. 90 tablet 3  . warfarin (COUMADIN) 5 MG tablet Take 1 tablet by mouth daily as directed by the coumadin clinic. 100 tablet 1   No current facility-administered medications for this encounter.    Allergies  Allergen Reactions  . Lisinopril Cough and Other (See Comments)  . Amoxicillin     Other reaction(s): penile swelling  . Kenalog [Triamcinolone]     Other reaction(s): rash  . Lasix [Furosemide] Other (See Comments)     Pt is unaware of reaction    Social History   Socioeconomic History  . Marital status: Married    Spouse name: Not on file  . Number of children: Not on file  . Years of education: Not on file  . Highest education level: Not on file  Occupational History  . Not on file  Tobacco Use  . Smoking status: Never Smoker  . Smokeless tobacco: Never Used  Substance and Sexual Activity  . Alcohol use: No  . Drug use: No  . Sexual activity: Not on file  Other Topics Concern  . Not on file  Social History Narrative  . Not on file   Social Determinants of Health   Financial Resource Strain: Not on file  Food Insecurity: Not on file  Transportation Needs: Not on file  Physical Activity: Not on file  Stress: Not on file  Social Connections: Not on file  Intimate Partner Violence:  Not on file     ROS- All systems are reviewed and negative except as per the HPI above.  Physical Exam: Vitals:   04/04/20 1329  BP: (!) 158/90  Pulse: (!) 124  Weight: 79.8 kg  Height: _0  (1.753 m)    Well appearing, alert and conversant, regular work of breathing,  good skin color Eyes- anicteric, neuro- grossly intact, skin- no apparent rash or lesions or cyanosis, mouth- oral mucosa is pink   Wt Readings from Last 3 Encounters:  04/04/20 79.8 kg  04/29/19 80.3 kg  03/08/18 83.4 kg    EKG today demonstrates  Afib Vent. rate 124 BPM QRS duration 76 ms QT/QTc 334/479 ms   Echo 06/21/13 demonstrated  - Left ventricle: The cavity size was normal. There was  moderate concentric hypertrophy. Systolic function was  normal. The estimated ejection fraction was in the range  of 60% to 65%. Wall motion was normal; there were no  regional wall motion abnormalities. Features are  consistent with a pseudonormal left ventricular filling  pattern, with concomitant abnormal relaxation and  increased filling pressure (grade 2 diastolic  dysfunction).  - Aortic valve:  Bioprosthetic aortic valve. No significant  bioprosthetic valve stenosis. Mean gradient: 34m Hg (S).  Peak gradient: 276mHg (S).  - Mitral valve: Mildly calcified annulus. Mildly calcified  leaflets . Mild regurgitation.  - Left atrium: The atrium was moderately dilated.  - Right ventricle: The cavity size was normal. Systolic  function was normal.  - Right atrium: The atrium was mildly dilated.  - Pulmonary arteries: PA peak pressure: 4661mg (S).  - Systemic veins: IVC measured 2.4 cm with normal  respirophasic variation, suggesting RA pressure 8 mmHg.  Impressions:    Epic records are reviewed at length today  CHA2DS2-VASc Score = 4  The patient's score is based upon: CHF History: No HTN History: Yes Diabetes History: No Stroke History: No Vascular Disease History: Yes Age Score: 2 Gender Score: 0      ASSESSMENT AND PLAN: 1. Persistent Atrial Fibrillation/atrial flutter The patient's CHA2DS2-VASc score is 4, indicating a 4.8% annual risk of stroke.   Patient in persistent symptomatic afib.  We discussed therapeutic options today. Given his rapid rates and symptoms, will pursue TEE guided DCCV instead of waiting for 4 therapeutic INRs. Patient in agreement with plan.  Patient does have a history of significant bradycardia on higher doses of rate control with amiodarone. Possible tachybrady, may need to consider PPM. Since this is his first occurrence in ~5-6 years, will see how he does post DCCV. Continue warfarin   2. Secondary Hypercoagulable State (ICD10:  D68.69) The patient is at significant risk for stroke/thromboembolism based upon his CHA2DS2-VASc Score of 4.  Continue Warfarin (Coumadin).   3. HTN Stable, no changes today.  5. CAD S/p CABG No anginal symptoms.  6. Aortic stenosis S/p AVR with bioprosthetic valve 2015   Follow up in the AF clinic one week post DCCV.   [facility]eLodge PoleC [phone]/2022 1:35 PM

## 2020-04-04 NOTE — Telephone Encounter (Signed)
I spoke to the patient and scheduled INR 2/10 @ 10:00 for procedure @ 1200.

## 2020-04-09 ENCOUNTER — Other Ambulatory Visit: Payer: Self-pay | Admitting: Interventional Cardiology

## 2020-04-10 ENCOUNTER — Other Ambulatory Visit (HOSPITAL_COMMUNITY)
Admission: RE | Admit: 2020-04-10 | Discharge: 2020-04-10 | Disposition: A | Discharge status: Home / Self Care | Payer: Medicare Other | Source: Ambulatory Visit | Attending: Cardiology | Admitting: Cardiology

## 2020-04-10 DIAGNOSIS — Z20822 Contact with and (suspected) exposure to covid-19: Secondary | ICD-10-CM | POA: Insufficient documentation

## 2020-04-10 DIAGNOSIS — Z01812 Encounter for preprocedural laboratory examination: Secondary | ICD-10-CM | POA: Insufficient documentation

## 2020-04-10 LAB — SARS CORONAVIRUS 2 (TAT 6-24 HRS): SARS Coronavirus 2: NEGATIVE

## 2020-04-12 ENCOUNTER — Ambulatory Visit (HOSPITAL_COMMUNITY)
Admission: RE | Admit: 2020-04-12 | Discharge: 2020-04-12 | Disposition: A | Discharge status: Home / Self Care | Payer: Medicare Other | Attending: Cardiology | Admitting: Cardiology

## 2020-04-12 ENCOUNTER — Other Ambulatory Visit (HOSPITAL_COMMUNITY): Payer: Self-pay | Admitting: Internal Medicine

## 2020-04-12 ENCOUNTER — Ambulatory Visit (INDEPENDENT_AMBULATORY_CARE_PROVIDER_SITE_OTHER): Payer: Medicare Other | Admitting: Pharmacist

## 2020-04-12 ENCOUNTER — Encounter (HOSPITAL_COMMUNITY): Payer: Self-pay | Admitting: Cardiology

## 2020-04-12 ENCOUNTER — Ambulatory Visit (HOSPITAL_BASED_OUTPATIENT_CLINIC_OR_DEPARTMENT_OTHER): Payer: Medicare Other

## 2020-04-12 ENCOUNTER — Encounter (HOSPITAL_COMMUNITY)
Admission: RE | Disposition: A | Discharge status: Home / Self Care | Payer: Self-pay | Source: Home / Self Care | Attending: Cardiology

## 2020-04-12 ENCOUNTER — Ambulatory Visit (HOSPITAL_COMMUNITY): Payer: Medicare Other | Admitting: Certified Registered"

## 2020-04-12 ENCOUNTER — Other Ambulatory Visit: Payer: Self-pay

## 2020-04-12 DIAGNOSIS — I081 Rheumatic disorders of both mitral and tricuspid valves: Secondary | ICD-10-CM | POA: Insufficient documentation

## 2020-04-12 DIAGNOSIS — Z7901 Long term (current) use of anticoagulants: Secondary | ICD-10-CM | POA: Insufficient documentation

## 2020-04-12 DIAGNOSIS — I4891 Unspecified atrial fibrillation: Secondary | ICD-10-CM | POA: Diagnosis not present

## 2020-04-12 DIAGNOSIS — Z79899 Other long term (current) drug therapy: Secondary | ICD-10-CM | POA: Insufficient documentation

## 2020-04-12 DIAGNOSIS — I4819 Other persistent atrial fibrillation: Secondary | ICD-10-CM | POA: Diagnosis not present

## 2020-04-12 DIAGNOSIS — I340 Nonrheumatic mitral (valve) insufficiency: Secondary | ICD-10-CM

## 2020-04-12 DIAGNOSIS — I2510 Atherosclerotic heart disease of native coronary artery without angina pectoris: Secondary | ICD-10-CM | POA: Insufficient documentation

## 2020-04-12 DIAGNOSIS — Z5181 Encounter for therapeutic drug level monitoring: Secondary | ICD-10-CM | POA: Diagnosis not present

## 2020-04-12 DIAGNOSIS — Z951 Presence of aortocoronary bypass graft: Secondary | ICD-10-CM | POA: Diagnosis not present

## 2020-04-12 DIAGNOSIS — D6869 Other thrombophilia: Secondary | ICD-10-CM | POA: Diagnosis not present

## 2020-04-12 DIAGNOSIS — I480 Paroxysmal atrial fibrillation: Secondary | ICD-10-CM

## 2020-04-12 DIAGNOSIS — I4892 Unspecified atrial flutter: Secondary | ICD-10-CM | POA: Diagnosis not present

## 2020-04-12 DIAGNOSIS — K219 Gastro-esophageal reflux disease without esophagitis: Secondary | ICD-10-CM | POA: Diagnosis not present

## 2020-04-12 DIAGNOSIS — I10 Essential (primary) hypertension: Secondary | ICD-10-CM | POA: Insufficient documentation

## 2020-04-12 HISTORY — PX: CARDIOVERSION: SHX1299

## 2020-04-12 HISTORY — PX: TEE WITHOUT CARDIOVERSION: SHX5443

## 2020-04-12 LAB — ECHO TEE
AV Mean grad: 8 mmHg
AV Peak grad: 16.3 mmHg
Ao pk vel: 2.02 m/s

## 2020-04-12 LAB — POCT INR: INR: 2.6 (ref 2.0–3.0)

## 2020-04-12 SURGERY — ECHOCARDIOGRAM, TRANSESOPHAGEAL
Anesthesia: General

## 2020-04-12 MED ORDER — PROPOFOL 10 MG/ML IV BOLUS
INTRAVENOUS | Status: DC | PRN
  Administered 2020-04-12: 30 mg via INTRAVENOUS

## 2020-04-12 MED ORDER — SODIUM CHLORIDE 0.9 % IV SOLN: INTRAVENOUS | Status: DC

## 2020-04-12 MED ORDER — PROPOFOL 500 MG/50ML IV EMUL
INTRAVENOUS | Status: DC | PRN
  Administered 2020-04-12: 150 ug/kg/min via INTRAVENOUS

## 2020-04-12 MED ORDER — PHENYLEPHRINE HCL (PRESSORS) 10 MG/ML IV SOLN
INTRAVENOUS | Status: DC | PRN
  Administered 2020-04-12: 120 ug via INTRAVENOUS
  Administered 2020-04-12: 80 ug via INTRAVENOUS
  Administered 2020-04-12: 120 ug via INTRAVENOUS

## 2020-04-12 NOTE — Anesthesia Procedure Notes (Signed)
Procedure Name: MAC Date/Time: 04/12/2020 11:39 AM Performed by: Kathryne Hitch, CRNA Pre-anesthesia Checklist: Patient identified, Emergency Drugs available, Suction available and Patient being monitored Patient Re-evaluated:Patient Re-evaluated prior to induction Oxygen Delivery Method: Nasal cannula Preoxygenation: Pre-oxygenation with 100% oxygen Induction Type: IV induction Placement Confirmation: positive ETCO2 Dental Injury: Teeth and Oropharynx as per pre-operative assessment

## 2020-04-12 NOTE — CV Procedure (Addendum)
     Transesophageal Echocardiogram Note  Robert [name] [id number] [birth year]  Procedure: Transesophageal Echocardiogram Indications: atrial fibrillation  Procedure Details Consent: Obtained Time Out: Verified patient identification, verified procedure, site/side was marked, verified correct patient position, special equipment/implants available, Radiology Safety Procedures followed,  medications/allergies/relevent history reviewed, required imaging and test results available.  Performed  Medications: Propofol 237 mg administered by anesthesia staff  1. The patient doesn't have a residual left atrial appendage post  clipping during his CABG/AVR/maze surgery in 2015.  2. Left ventricular ejection fraction, by estimation, is 50 to 55%. The  left ventricle has low normal function. The left ventricle has no regional  wall motion abnormalities. Left ventricular diastolic function could not  be evaluated.  3. Right ventricular systolic function is normal. The right ventricular  size is normal.  4. Left atrial appendage is clipped. Left atrial size was severely  dilated. No left atrial/left atrial appendage thrombus was detected.  5. Right atrial size was moderately dilated.  6. The mitral valve is normal in structure. Moderate mitral valve  regurgitation. No evidence of mitral stenosis.  7. Tricuspid valve regurgitation is moderate.  8. The aortic valve has been repaired/replaced. Aortic valve  regurgitation is not visualized. No aortic stenosis is present. There is a  23 mm Magna Ease pericardial valve valve present in the aortic position.  Procedure Date: 2015. Echo findings are  consistent with normal structure and function of the aortic valve  prosthesis. Aortic valve mean gradient measures 8.0 mmHg.  9. The inferior vena cava is normal in size with greater than 50%  respiratory variability, suggesting right atrial pressure of 3 mmHg.   Complications: No apparent  complications Patient did tolerate procedure well.  Ena Dawley, MD, [facility] 04/12/2020, 12:22 PM   Procedure: DC Cardioversion Indications: atrial fibrillation  Procedure Details Consent: Obtained Time Out: Verified patient identification, verified procedure, site/side was marked, verified correct patient position, special equipment/implants available, Radiology Safety Procedures followed,  medications/allergies/relevent history reviewed, required imaging and test results available.  Performed  The patient has been on adequate anticoagulation.  Synchronous cardioversion was performed at 120 joules.  The cardioversion was successful.   Complications: No apparent complications Patient did tolerate procedure well.   Ena Dawley, MD, [facility] 04/12/2020, 12:22 PM   Addendum:  Immediately post cardioversion patient went into junctional rhythm with very frequent PACs and PVCs in a pattern of bigeminy and short runs of atrial tachycardia, he eventually had more more sinus beats but still bradycardic in 50s alternating with junctional rhythm.  He was advised to hold his Toprol-XL tonight and starting tomorrow just take it once a day instead of his usual twice a day prescription.  He will follow with Dr. Irish Lack, he is advised to call us if he feels dizzy or weak.  Ena Dawley, MD 04/12/2020

## 2020-04-12 NOTE — Patient Instructions (Signed)
Continue warfarin 1 tablet daily. Next INR 3/8 @ [place] [facility]. Call if scheduled for any procedures or has any new medications  [phone]

## 2020-04-12 NOTE — Discharge Instructions (Signed)

## 2020-04-12 NOTE — Interval H&P Note (Signed)
History and Physical Interval Note:  04/12/2020 10:46 AM  Robert [name]  has presented today for surgery, with the diagnosis of A-FIB.  The various methods of treatment have been discussed with the patient and family. After consideration of risks, benefits and other options for treatment, the patient has consented to  Procedure(s): TRANSESOPHAGEAL ECHOCARDIOGRAM (TEE) (N/A) CARDIOVERSION (N/A) as a surgical intervention.  The patient's history has been reviewed, patient examined, no change in status, stable for surgery.  I have reviewed the patient's chart and labs.  Questions were answered to the patient's satisfaction.     Ena Dawley

## 2020-04-12 NOTE — Progress Notes (Signed)
  Echocardiogram Echocardiogram Transesophageal has been performed.  Robert [name] 04/12/2020, 12:32 PM

## 2020-04-12 NOTE — Progress Notes (Signed)
Pt now off neo for 20 min and doing well. See vital signs. Dr. Meda Coffee in to see patient and to look at EKG where patient is in a junctional rhythm and it seems he is going in and out of SR and junctional . Pt blood pressure is stable and HR in 60-70. DR. Meda Coffee told patient to cut his metoprolol dose to once a day from twice a day went over with daughter and wife. Pt voices understanding .

## 2020-04-12 NOTE — Transfer of Care (Signed)
Immediate Anesthesia Transfer of Care Note  Patient: Robert [name]  Procedure(s) Performed: TRANSESOPHAGEAL ECHOCARDIOGRAM (TEE) (N/A ) CARDIOVERSION (N/A )  Patient Location: Endoscopy Unit  Anesthesia Type:MAC  Level of Consciousness: drowsy and patient cooperative  Airway & Oxygen Therapy: Patient Spontanous Breathing  Post-op Assessment: Report given to RN and Post -op Vital signs reviewed and stable  Post vital signs: Reviewed and stable  Last Vitals:  Vitals Value Taken Time  BP 78/40   Temp    Pulse 60 04/12/20 1217  Resp 12 04/12/20 1217  SpO2 99 % 04/12/20 1217  Vitals shown include unvalidated device data.  Last Pain:  Vitals:   04/12/20 1053  TempSrc: Oral  PainSc: 0-No pain         Complications: No complications documented.

## 2020-04-12 NOTE — Anesthesia Preprocedure Evaluation (Signed)
Anesthesia Evaluation  Patient identified by MRN, date of birth, ID band Patient awake    Reviewed: Allergy & Precautions, NPO status , Patient's Chart, lab work & pertinent test results  History of Anesthesia Complications Negative for: history of anesthetic complications  Airway Mallampati: I  TM Distance: >3 FB Neck ROM: Full    Dental  (+) Edentulous Upper, Edentulous Lower   Pulmonary  04/10/2020 SARS coronavirus NEG   breath sounds clear to auscultation       Cardiovascular hypertension, Pt. on medications and Pt. on home beta blockers (-) angina+ CAD, + Cardiac Stents and + CABG  + dysrhythmias Atrial Fibrillation + Valvular Problems/Murmurs (s/p AVR)  Rhythm:Irregular Rate:Normal  '15 ECHO: EF 60- 65%. no regional wall motion abnormalities. (grade 2 diastolic dysfunction).  - Aortic valve: Bioprosthetic aortic valve. No significant  bioprosthetic valve stenosis. Mean gradient: 38m Hg (S). Peak gradient: 261mHg (S).     Neuro/Psych negative neurological ROS     GI/Hepatic Neg liver ROS, GERD  Medicated and Controlled,  Endo/Other  negative endocrine ROS  Renal/GU negative Renal ROS   H/o prostate cancer    Musculoskeletal   Abdominal   Peds  Hematology Coumadin: INR 2.6   Anesthesia Other Findings   Reproductive/Obstetrics                             Anesthesia Physical Anesthesia Plan  ASA: III  Anesthesia Plan: General   Post-op Pain Management:    Induction: Intravenous  PONV Risk Score and Plan: 2 and Ondansetron and Treatment may vary due to age or medical condition  Airway Management Planned: Natural Airway and Nasal Cannula  Additional Equipment: None  Intra-op Plan:   Post-operative Plan:   Informed Consent: I have reviewed the patients History and Physical, chart, labs and discussed the procedure including the risks, benefits and alternatives for the  proposed anesthesia with the patient or authorized representative who has indicated his/her understanding and acceptance.       Plan Discussed with: CRNA and Surgeon  Anesthesia Plan Comments: (Plan routine monitors, MAC with GA if cardioversion needed)        Anesthesia Quick Evaluation

## 2020-04-12 NOTE — Progress Notes (Signed)
Blood pressure in 70's when back from cardioversion/TEE. Pt awake alert and oriented and HR in 60's.Brooke CRNA back to bedside and starts neo drip for blood pressure support at 58mcg. Will wean per protocol.

## 2020-04-12 NOTE — Anesthesia Postprocedure Evaluation (Signed)
Anesthesia Post Note  Patient: KEONI RISINGER  Procedure(s) Performed: TRANSESOPHAGEAL ECHOCARDIOGRAM (TEE) (N/A ) CARDIOVERSION (N/A )     Patient location during evaluation: Endoscopy Anesthesia Type: General Level of consciousness: awake and alert, patient cooperative and oriented Pain management: pain level controlled Vital Signs Assessment: post-procedure vital signs reviewed and stable Respiratory status: spontaneous breathing, nonlabored ventilation, respiratory function stable and patient connected to nasal cannula oxygen Cardiovascular status: stable and blood pressure returned to baseline Postop Assessment: no apparent nausea or vomiting Anesthetic complications: no   No complications documented.  Last Vitals:  Vitals:   04/12/20 1325 04/12/20 1329  BP: 131/66 (!) 141/61  Pulse: 68 68  Resp: 18 (!) 21  Temp:    SpO2: 99% 99%    Last Pain:  Vitals:   04/12/20 1325  TempSrc:   PainSc: 0-No pain                 Griselda Bramblett,E. Zawadi Aplin

## 2020-04-15 ENCOUNTER — Encounter (HOSPITAL_COMMUNITY): Payer: Self-pay | Admitting: Cardiology

## 2020-04-16 ENCOUNTER — Telehealth: Payer: Self-pay | Admitting: Interventional Cardiology

## 2020-04-16 NOTE — Telephone Encounter (Signed)
Called patient back about message. Patient complaining of elevated HR yesterday 125 and today HR 130 and 120. Patient is currently taking metoprolol succinate 50 mg BID. Patient stated he feels fine and he is asymptomatic. Will forward to Dr. Irish Lack and Adline Peals PA for advisement.

## 2020-04-16 NOTE — Telephone Encounter (Signed)
STAT if HR is under 50 or over 120 (normal HR is 60-100 beats per minute)  1) What is your heart rate? No reading taken today  2) Do you have a log of your heart rate readings (document readings)?  04/15/20: 125  3) Do you have any other symptoms?   No. Patient states he is concerned because yesterday her took his HR and it was 125. He states he did not record it today, but he assumes his medication may need to be adjusted. Please call.

## 2020-04-16 NOTE — Telephone Encounter (Signed)
Pt daughter called in and would like to speak with Pam , she stated pt has an apt with Aifib   She is not really clear as to why pt is going to see the afib clinic this week .  They are not clear about his meds.   Best number [phone]

## 2020-04-16 NOTE — Telephone Encounter (Signed)
Called and spoke with Robert [name]. There was miscommunication on what the afib clinic was and our role in his care. Explained patient was still a patient of Dr. Irish Lack as well. Heart rate currently is right around 100 - pt was told post cardioversion by shocking provider to decrease metoprolol to 50mg  daily but when pt heart rates went up yesterday patient increased back up to 50mg  BID. Pt daughter to accompany him to his appointment as there will be discussion of AAD with early return of afib. Robert [name] appreciative of call back with explanation and will plan to come for appointment this week. Pt does have follow up with Dr. Irish Lack soon as well.

## 2020-04-18 NOTE — Progress Notes (Signed)
Primary Care Physician: Josetta Huddle, MD Primary Cardiologist: Dr Irish Lack  Primary Electrophysiologist: none Referring Physician: Dr Alda Berthold is a 87 y.o. male with a history of CAD s/p CABG and MAZE, aortic stenosis s/p AVR, prior GI bleed 2011, HLD, HTN, polymyalgia rheumatica, and atrial fibrillation who presents for follow up in the [facility].  The patient was initially diagnosed with atrial fibrillation in remotely and was diagnosed with atrial flutter 04/2014 after presenting with symptoms of heart racing. He has been maintained on metoprolol. Patient is on warfarin for a CHADS2VASC score of 4. He was in his usual state of health until about 3 weeks ago when he started having palpitations, intermittent dizziness, and dyspnea with exertion. There were no specific triggers that he could identify.   On follow up today, patient is s/p DCCV on 04/12/20. He was noted to be bradycardic and intermittently in a junctional rhythm and his BB was reduced. Two days later, he began having tachypalpitations and dyspnea with exertion. He resumed his higher dose of metoprolol. He is back in afib today.   Today, he denies symptoms of chest pain, orthopnea, PND, lower extremity edema, presyncope, syncope, snoring, daytime somnolence, bleeding, or neurologic sequela. The patient is tolerating medications without difficulties and is otherwise without complaint today.    Atrial Fibrillation Risk Factors:  he does not have symptoms or diagnosis of sleep apnea. he does not have a history of rheumatic fever.   he has a BMI of Body mass index is 25.75 kg/m.[name] Filed Weights   04/19/20 0834  Weight: 79.1 kg    Family History  Problem Relation Age of Onset  . CVA Mother   . Kidney disease Father   . Breast cancer Daughter      Atrial Fibrillation Management history:  Previous antiarrhythmic drugs: amiodarone Previous cardioversions:  04/12/20 Previous ablations: MAZE 2015 CHADS2VASC score: 4 Anticoagulation history: warfarin   Past Medical History:  Diagnosis Date  . Aortic stenosis    a. s/p tissue AVR 05/2013;  b. Echo (06/2013):  Mod LVH, EF 60-65%, no RWMA, Gr 2 DD, AVR ok (mean 13 mmHg), MAC, mild MR, mod LAE, mild RAE, PASP 46 mmHg (mild pulmo HTN)  . Blood loss anemia    a. 05/2009 a/w GIB.  [name] CAD (coronary artery disease)    a. Cath 04/2009: BMS to LCx 04/2009; CTO of RCA with L-R collaterals, 40% ostial diag.  . Carpal tunnel syndrome    bilateral, carpal tunnel release x 2  . Esophageal ulcer    a. 05/2009 with hemorrhage - injected with epinephrine-Dr. Herbie Baltimore Buccini.  . Esophagitis    a. 05/2009 a/w GIB.  [name] GERD (gastroesophageal reflux disease)    EGD, Dr. Toney Rakes 2008  . GI bleed    a. 05/2009 as above: with associated anemia. Lower esophageal ulcer with extensive erosive esophagitis and large clot by EGD 05/2009.   [name] Hx of colonoscopy 2008   Dr. Toney Rakes, polyps- 5 yr f/u recommended  . Hyperlipidemia   . Hypertension   . PAF (paroxysmal atrial fibrillation) ([place])    a. Post-op AVR;  b. 04/2014 recurrent.  . Perforated ear drum    right ear drum, tube in left ear drum, Dr. Constance Holster  . Polymyalgia rheumatica ([place])   . Prostate cancer ([name])   . Vitamin D deficiency    Past Surgical History:  Procedure Laterality Date  . AORTIC VALVE REPLACEMENT N/A 05/12/2013  Procedure: AORTIC VALVE REPLACEMENT (AVR);  Surgeon: Ivin Poot, MD;  Location: [place];  Service: Open Heart Surgery;  Laterality: N/A;  . CARDIAC CATHETERIZATION  2011  . CARDIOVERSION N/A 04/12/2020   Procedure: CARDIOVERSION;  Surgeon: Dorothy Spark, MD;  Location: [place];  Service: Cardiovascular;  Laterality: N/A;  . CORONARY ARTERY BYPASS GRAFT N/A 05/12/2013   Procedure: CORONARY ARTERY BYPASS GRAFTING (CABG);  Surgeon: Ivin Poot, MD;  Location: [place];  Service: Open Heart Surgery;  Laterality: N/A;  CABG  x 1 using left leg greater saphenous vein harvested endoscopically  . INTRAOPERATIVE TRANSESOPHAGEAL ECHOCARDIOGRAM N/A 05/12/2013   Procedure: INTRAOPERATIVE TRANSESOPHAGEAL ECHOCARDIOGRAM;  Surgeon: Ivin Poot, MD;  Location: [place];  Service: Open Heart Surgery;  Laterality: N/A;  . LEFT AND RIGHT HEART CATHETERIZATION WITH CORONARY ANGIOGRAM N/A 05/09/2013   Procedure: LEFT AND RIGHT HEART CATHETERIZATION WITH CORONARY ANGIOGRAM;  Surgeon: Lorretta Harp, MD;  Location[facility] C[facility] LAB;  Service: Cardiovascular;  Laterality: N/A;  . MAZE N/A 05/12/2013   Procedure: MAZE;  Surgeon: Ivin Poot, MD;  Location: [place];  Service: Open Heart Surgery;  Laterality: N/A;  . PROSTATECTOMY    . TEE WITHOUT CARDIOVERSION N/A 04/12/2020   Procedure: TRANSESOPHAGEAL ECHOCARDIOGRAM (TEE);  Surgeon: Dorothy Spark, MD;  Location[facility] ENDOSCOPY;  Service: Cardiovascular;  Laterality: N/A;    Current Outpatient Medications  Medication Sig Dispense Refill  . acetaminophen (TYLENOL) 500 MG tablet TAKE ONE TABLET BY MOUTH  PRN    . cholecalciferol (VITAMIN D) 1000 UNITS tablet Take 1,000 Units by mouth 3 (three) times a week.    . losartan (COZAAR) 50 MG tablet TAKE 1 TABLET DAILY 90 tablet 3  . metoprolol succinate (TOPROL-XL) 50 MG 24 hr tablet Take 1 tablet (50 mg total) by mouth in the morning and at bedtime. 180 tablet 3  . omeprazole (PRILOSEC) 20 MG capsule Take 20 mg by mouth daily.    . Potassium 99 MG TABS Take 99 mg by mouth 2 (two) times a week.    . rosuvastatin (CRESTOR) 20 MG tablet TAKE 1 TABLET DAILY 90 tablet 0  . traMADol (ULTRAM) 50 MG tablet TAKE ONE TABLET BY MOUTH  PRN    . vitamin C (ASCORBIC ACID) 500 MG tablet Take 500 mg by mouth 3 (three) times a week.    . warfarin (COUMADIN) 5 MG tablet Take 1 tablet by mouth daily as directed by the coumadin clinic. 100 tablet 1   No current facility-administered medications for this encounter.    Allergies  Allergen Reactions  .  Lisinopril Cough  . Amoxicillin Swelling    penile swelling  . Lasix [Furosemide] Other (See Comments)    Pt is unaware of reaction  . Kenalog [Triamcinolone] Rash    Other reaction(s): rash    Social History   Socioeconomic History  . Marital status: Married    Spouse name: Not on file  . Number of children: Not on file  . Years of education: Not on file  . Highest education level: Not on file  Occupational History  . Not on file  Tobacco Use  . Smoking status: Never Smoker  . Smokeless tobacco: Never Used  Substance and Sexual Activity  . Alcohol use: No  . Drug use: No  . Sexual activity: Not on file  Other Topics Concern  . Not on file  Social History Narrative  . Not on file   Social Determinants of Health  Financial Resource Strain: Not on file  Food Insecurity: Not on file  Transportation Needs: Not on file  Physical Activity: Not on file  Stress: Not on file  Social Connections: Not on file  Intimate Partner Violence: Not on file     ROS- All systems are reviewed and negative except as per the HPI above.  Physical Exam: Vitals:   04/19/20 0834  BP: (!) 160/88  Pulse: 97  Weight: 79.1 kg  Height: _0  (1.753 m)    GEN- The patient is well appearing elderly male, alert and oriented x 3 today.   HEENT-head normocephalic, atraumatic, sclera clear, conjunctiva pink, hearing intact, trachea midline. Lungs- Clear to ausculation bilaterally, normal work of breathing Heart- irregular rate and rhythm, no murmurs, rubs or gallops  GI- soft, NT, ND, + BS Extremities- no clubbing, cyanosis, or edema MS- no significant deformity or atrophy Skin- no rash or lesion Psych- euthymic mood, full affect Neuro- strength and sensation are intact   Wt Readings from Last 3 Encounters:  04/19/20 79.1 kg  04/12/20 79.8 kg  04/04/20 79.8 kg    EKG today demonstrates  Afib  Vent. rate 97 BPM QRS duration 84 ms QT/QTc 322/408 ms  Echo 06/21/13 demonstrated  -  Left ventricle: The cavity size was normal. There was  moderate concentric hypertrophy. Systolic function was  normal. The estimated ejection fraction was in the range  of 60% to 65%. Wall motion was normal; there were no  regional wall motion abnormalities. Features are  consistent with a pseudonormal left ventricular filling  pattern, with concomitant abnormal relaxation and  increased filling pressure (grade 2 diastolic  dysfunction).  - Aortic valve: Bioprosthetic aortic valve. No significant  bioprosthetic valve stenosis. Mean gradient: 57m Hg (S).  Peak gradient: 223mHg (S).  - Mitral valve: Mildly calcified annulus. Mildly calcified  leaflets . Mild regurgitation.  - Left atrium: The atrium was moderately dilated.  - Right ventricle: The cavity size was normal. Systolic  function was normal.  - Right atrium: The atrium was mildly dilated.  - Pulmonary arteries: PA peak pressure: 4632mg (S).  - Systemic veins: IVC measured 2.4 cm with normal  respirophasic variation, suggesting RA pressure 8 mmHg.  Impressions:    Epic records are reviewed at length today  CHA2DS2-VASc Score = 4  The patient's score is based upon: CHF History: No HTN History: Yes Diabetes History: No Stroke History: No Vascular Disease History: Yes Age Score: 2 Gender Score: 0      ASSESSMENT AND PLAN: 1. Persistent Atrial Fibrillation/atrial flutter The patient's CHA2DS2-VASc score is 4, indicating a 4.8% annual risk of stroke.   S/p DCCV 04/12/20 with early return of afib. Rate controlled today. We discussed therapeutic options today. Given his h/o significant bradycardia and junctional rhythm post DCCV he may have tachybradycardia syndrome, limits therapy for afib. He definitely feels improved in SR. Agree with Dr VarIrish Lackat he may need evaluation for PPM. Will refer to EP.  Continue warfarin   2. Secondary Hypercoagulable State (ICD10:  D68.69) The patient is at  significant risk for stroke/thromboembolism based upon his CHA2DS2-VASc Score of 4.  Continue Warfarin (Coumadin).   3. HTN Stable, no changes today.  5. CAD S/p CABG No anginal symptoms.  6. Aortic stenosis S/p AVR with bioprosthetic valve 2015   Follow up with Dr VarIrish Lack scheduled. Follow up with EP.    [email or web address]  [id number] [phone] 04/19/2020 8:39 AM

## 2020-04-19 ENCOUNTER — Ambulatory Visit (HOSPITAL_COMMUNITY)
Admission: RE | Admit: 2020-04-19 | Discharge: 2020-04-19 | Disposition: A | Discharge status: Home / Self Care | Payer: Medicare Other | Source: Ambulatory Visit | Attending: Physician Assistant | Admitting: Physician Assistant

## 2020-04-19 ENCOUNTER — Encounter (HOSPITAL_COMMUNITY): Payer: Self-pay | Admitting: Physician Assistant

## 2020-04-19 ENCOUNTER — Other Ambulatory Visit: Payer: Self-pay

## 2020-04-19 VITALS — BP 160/88 | HR 97 | Ht 69.0 in | Wt 174.4 lb

## 2020-04-19 DIAGNOSIS — Z952 Presence of prosthetic heart valve: Secondary | ICD-10-CM | POA: Diagnosis not present

## 2020-04-19 DIAGNOSIS — E785 Hyperlipidemia, unspecified: Secondary | ICD-10-CM | POA: Insufficient documentation

## 2020-04-19 DIAGNOSIS — I340 Nonrheumatic mitral (valve) insufficiency: Secondary | ICD-10-CM | POA: Insufficient documentation

## 2020-04-19 DIAGNOSIS — I4819 Other persistent atrial fibrillation: Secondary | ICD-10-CM | POA: Insufficient documentation

## 2020-04-19 DIAGNOSIS — I495 Sick sinus syndrome: Secondary | ICD-10-CM

## 2020-04-19 DIAGNOSIS — Z7901 Long term (current) use of anticoagulants: Secondary | ICD-10-CM | POA: Diagnosis not present

## 2020-04-19 DIAGNOSIS — I10 Essential (primary) hypertension: Secondary | ICD-10-CM | POA: Insufficient documentation

## 2020-04-19 DIAGNOSIS — I350 Nonrheumatic aortic (valve) stenosis: Secondary | ICD-10-CM | POA: Diagnosis not present

## 2020-04-19 DIAGNOSIS — D6869 Other thrombophilia: Secondary | ICD-10-CM | POA: Diagnosis not present

## 2020-04-19 DIAGNOSIS — I4892 Unspecified atrial flutter: Secondary | ICD-10-CM | POA: Diagnosis not present

## 2020-04-19 DIAGNOSIS — Z951 Presence of aortocoronary bypass graft: Secondary | ICD-10-CM | POA: Insufficient documentation

## 2020-04-19 DIAGNOSIS — I2510 Atherosclerotic heart disease of native coronary artery without angina pectoris: Secondary | ICD-10-CM | POA: Diagnosis not present

## 2020-04-22 NOTE — Progress Notes (Signed)
Cardiology Office Note   Date:  04/24/2020   ID:  Robert [name], DOB 1928/02/06, MRN [id number]  PCP:  Josetta Huddle, MD    No chief complaint on file.  AFib  Wt Readings from Last 3 Encounters:  04/24/20 174 lb 12.8 oz (79.3 kg)  04/19/20 174 lb 6.4 oz (79.1 kg)  04/12/20 175 lb 14.8 oz (79.8 kg)       History of Present Illness: Robert [name] is a 87 y.o. male  who had a BMS to the circumflex in 2011. He had AFib in 2/15. Workup revealed severe AS. He had AVR with single vessel CABG, Maze procedure and left atrial clip in 2015.Later in 2015, He had presyncope and his HR was in the 40s. Resolved with stopping beta blocker and digoxin. He did well for a while.   In 2/16, he had palpitations and went to the ER. He was in atrial flutter 2:1 block. He was sent home. He was started on metoprolol and Eliquis in the clinic the next day. Rhythm converted.   Eliquis was changed to COumadin due to cost reasons.  Chronic left ankle swellingwhichprogressed to the whole left leg in 2017, vein removed for CABG.It Was caused by lymph node blocking vein flow. Initally diagnosed with PrCA [phone]. Recent check showed PSA was elevated and has reduced with Lupron. Swelling is nearly completely resolved after Lupron. He had a w/u for DVT which was negative. Following with Dr. Rosana Hoes for his prostate cancer.Undetectable PSA in 12/18.  Occasional leg edema.  Left leg cellulitis in 8/19, that has been persistent. U/s showed no DVT. Finished antibiotic course.   He had Covid in January 2021.  He received an antibody infusion.  He recovered very well and had minimal symptoms.  He got fully vacinated.  Chronic foot pain prevents exercise.  He feels like his joint pain gets worse when he takes his statin regularly.  He has had some improvement when he holds his statin.  He did stop taking his statin for a while and his LDL increased to 156 back in early February  2021.  He has had AFib with RVR.  DCCV was successful but he went back to AFib after 2 days.  He has had some anemia as well.  Hbg 10 -10.5.  Some DOE, notes that his HR goes to the 120-130 range.   Denies : Chest pain. Dizziness. Leg edema. Nitroglycerin use. Orthopnea. Palpitations. Paroxysmal nocturnal dyspnea. Syncope.   Still stays active with stretching and yardwork.  Lifts very light weights and rides a stationary bike.    Past Medical History:  Diagnosis Date  . Aortic stenosis    a. s/p tissue AVR 05/2013;  b. Echo (06/2013):  Mod LVH, EF 60-65%, no RWMA, Gr 2 DD, AVR ok (mean 13 mmHg), MAC, mild MR, mod LAE, mild RAE, PASP 46 mmHg (mild pulmo HTN)  . Blood loss anemia    a. 05/2009 a/w GIB.  [name] CAD (coronary artery disease)    a. Cath 04/2009: BMS to LCx 04/2009; CTO of RCA with L-R collaterals, 40% ostial diag.  . Carpal tunnel syndrome    bilateral, carpal tunnel release x 2  . Esophageal ulcer    a. 05/2009 with hemorrhage - injected with epinephrine-Dr. Herbie Baltimore Buccini.  . Esophagitis    a. 05/2009 a/w GIB.  [name] GERD (gastroesophageal reflux disease)    EGD, Dr. Toney Rakes 2008  . GI bleed    a. 05/2009 as above: with  associated anemia. Lower esophageal ulcer with extensive erosive esophagitis and large clot by EGD 05/2009.   [name] Hx of colonoscopy 2008   Dr. Toney Rakes, polyps- 5 yr f/u recommended  . Hyperlipidemia   . Hypertension   . PAF (paroxysmal atrial fibrillation) ([place])    a. Post-op AVR;  b. 04/2014 recurrent.  . Perforated ear drum    right ear drum, tube in left ear drum, Dr. Constance Holster  . Polymyalgia rheumatica ([place])   . Prostate cancer ([place])   . Vitamin D deficiency     Past Surgical History:  Procedure Laterality Date  . AORTIC VALVE REPLACEMENT N/A 05/12/2013   Procedure: AORTIC VALVE REPLACEMENT (AVR);  Surgeon: Ivin Poot, MD;  Location: [place];  Service: Open Heart Surgery;  Laterality: N/A;  . CARDIAC CATHETERIZATION  2011  . CARDIOVERSION N/A  04/12/2020   Procedure: CARDIOVERSION;  Surgeon: Dorothy Spark, MD;  Location: [place];  Service: Cardiovascular;  Laterality: N/A;  . CORONARY ARTERY BYPASS GRAFT N/A 05/12/2013   Procedure: CORONARY ARTERY BYPASS GRAFTING (CABG);  Surgeon: Ivin Poot, MD;  Location: [place];  Service: Open Heart Surgery;  Laterality: N/A;  CABG x 1 using left leg greater saphenous vein harvested endoscopically  . INTRAOPERATIVE TRANSESOPHAGEAL ECHOCARDIOGRAM N/A 05/12/2013   Procedure: INTRAOPERATIVE TRANSESOPHAGEAL ECHOCARDIOGRAM;  Surgeon: Ivin Poot, MD;  Location: [place];  Service: Open Heart Surgery;  Laterality: N/A;  . LEFT AND RIGHT HEART CATHETERIZATION WITH CORONARY ANGIOGRAM N/A 05/09/2013   Procedure: LEFT AND RIGHT HEART CATHETERIZATION WITH CORONARY ANGIOGRAM;  Surgeon: Lorretta Harp, MD;  Location: [facility] CATH LAB;  Service: Cardiovascular;  Laterality: N/A;  . MAZE N/A 05/12/2013   Procedure: MAZE;  Surgeon: Ivin Poot, MD;  Location: [place];  Service: Open Heart Surgery;  Laterality: N/A;  . PROSTATECTOMY    . TEE WITHOUT CARDIOVERSION N/A 04/12/2020   Procedure: TRANSESOPHAGEAL ECHOCARDIOGRAM (TEE);  Surgeon: Dorothy Spark, MD;  Location[facility] ENDOSCOPY;  Service: Cardiovascular;  Laterality: N/A;     Current Outpatient Medications  Medication Sig Dispense Refill  . acetaminophen (TYLENOL) 500 MG tablet as needed.    . cholecalciferol (VITAMIN D) 1000 UNITS tablet Take 1,000 Units by mouth 3 (three) times a week.    . losartan (COZAAR) 50 MG tablet TAKE 1 TABLET DAILY 90 tablet 3  . metoprolol succinate (TOPROL-XL) 50 MG 24 hr tablet Take 1 tablet (50 mg total) by mouth in the morning and at bedtime. 180 tablet 3  . omeprazole (PRILOSEC) 20 MG capsule Take 20 mg by mouth daily.    . Potassium 99 MG TABS Take 99 mg by mouth 2 (two) times a week.    . rosuvastatin (CRESTOR) 20 MG tablet TAKE 1 TABLET DAILY 90 tablet 0  . traMADol (ULTRAM) 50 MG tablet as needed.    .  vitamin C (ASCORBIC ACID) 500 MG tablet Take 500 mg by mouth 3 (three) times a week.    . warfarin (COUMADIN) 5 MG tablet Take 1 tablet by mouth daily as directed by the coumadin clinic. 100 tablet 1   No current facility-administered medications for this visit.    Allergies:   Lisinopril, Amoxicillin, Lasix [furosemide], and Kenalog [triamcinolone]    Social History:  The patient  reports that he has never smoked. He has never used smokeless tobacco. He reports that he does not drink alcohol and does not use drugs.   Family History:  The patient's *family history includes Breast cancer in his  daughter; CVA in his mother; Kidney disease in his father.    ROS:  Please see the history of present illness.   Otherwise, review of systems are positive for mild balance problems.   All other systems are reviewed and negative.    PHYSICAL EXAM: VS:  BP (!) 144/78   Pulse 83   Ht _0  (1.753 m)   Wt 174 lb 12.8 oz (79.3 kg)   SpO2 97%   BMI 25.81 kg/m  , BMI Body mass index is 25.81 kg/m. GEN: Well nourished, well developed, in no acute distress  HEENT: normal  Neck: no JVD, carotid bruits, or masses Cardiac: irregularly irregular; no murmurs, rubs, or gallops,no edema  Respiratory:  clear to auscultation bilaterally, normal work of breathing GI: soft, nontender, nondistended, + BS MS: no deformity or atrophy  Skin: warm and dry, no rash Neuro:  Strength and sensation are intact Psych: euthymic mood, full affect   EKG:   The ekg ordered 2/17 demonstrates AFib, rate controlled   Recent Labs: 04/04/2020: BUN 17; Creatinine, Ser 0.91; Hemoglobin 10.0; Platelets 142; Potassium 4.8; Sodium 140   Lipid Panel    Component Value Date/Time   CHOL 119 07/09/2017 0824   TRIG 115 07/09/2017 0824   HDL 33 (L) 07/09/2017 0824   CHOLHDL 3.6 07/09/2017 0824   LDLCALC 63 07/09/2017 0824     Other studies Reviewed: Additional studies/ records that were reviewed today with results  demonstrating: labs reviewed, hospital records reviewed.   ASSESSMENT AND PLAN:  1. S/p AVR : SBE prophylaxis.  No CHF.   2. AFib: COumadin for stroke prevention.  Rate controlled today. Could consider Amiodarone, but this may exacerbate bradycardia.  3. Chronic anticoagulation: Hbg 10-10.5.  No bleeding noted.   4. HTN: The current medical regimen is effective;  continue present plan and medications.  Home readings are in the 634-949 systolic range.  5. Tachy/brady syndrome: Seeing Dr. Lovena Le in a few weeks to discuss pacer.  He is still quite active with difficult to control high HR in the setting of AFib.  6. Hyperlipidemia: check lipids today. Check TSH given recent AFib, BNP ( given DOE), CBC.    Current medicines are reviewed at length with the patient today.  The patient concerns regarding his medicines were addressed.  The following changes have been made:  No change  Labs/ tests ordered today include:  No orders of the defined types were placed in this encounter.   Recommend 150 minutes/week of aerobic exercise Low fat, low carb, high fiber diet recommended  Disposition:   FU in 6 months   Signed, Larae Grooms, MD  04/24/2020 8:44 AM    [facility] [address], [place], [place]  [address] Phone: [phone]; Fax: [phone]

## 2020-04-24 ENCOUNTER — Ambulatory Visit (INDEPENDENT_AMBULATORY_CARE_PROVIDER_SITE_OTHER): Payer: Medicare Other | Admitting: Interventional Cardiology

## 2020-04-24 ENCOUNTER — Encounter: Payer: Self-pay | Admitting: Interventional Cardiology

## 2020-04-24 ENCOUNTER — Other Ambulatory Visit: Payer: Self-pay

## 2020-04-24 VITALS — BP 144/78 | HR 83 | Ht 69.0 in | Wt 174.8 lb

## 2020-04-24 DIAGNOSIS — R0600 Dyspnea, unspecified: Secondary | ICD-10-CM

## 2020-04-24 DIAGNOSIS — Z952 Presence of prosthetic heart valve: Secondary | ICD-10-CM

## 2020-04-24 DIAGNOSIS — I4819 Other persistent atrial fibrillation: Secondary | ICD-10-CM | POA: Diagnosis not present

## 2020-04-24 DIAGNOSIS — E782 Mixed hyperlipidemia: Secondary | ICD-10-CM

## 2020-04-24 DIAGNOSIS — I10 Essential (primary) hypertension: Secondary | ICD-10-CM | POA: Diagnosis not present

## 2020-04-24 DIAGNOSIS — I495 Sick sinus syndrome: Secondary | ICD-10-CM

## 2020-04-24 DIAGNOSIS — R0609 Other forms of dyspnea: Secondary | ICD-10-CM

## 2020-04-24 MED ORDER — LOSARTAN POTASSIUM 50 MG PO TABS: 50.0000 mg | ORAL_TABLET | Freq: Every day | ORAL | 3 refills | Status: DC

## 2020-04-24 MED ORDER — METOPROLOL SUCCINATE ER 50 MG PO TB24: 50.0000 mg | ORAL_TABLET | Freq: Two times a day (BID) | ORAL | 3 refills | Status: DC

## 2020-04-24 MED ORDER — ROSUVASTATIN CALCIUM 20 MG PO TABS: 20.0000 mg | ORAL_TABLET | Freq: Every day | ORAL | 3 refills | Status: DC

## 2020-04-24 NOTE — Patient Instructions (Signed)
Medication Instructions:  Your physician recommends that you continue on your current medications as directed. Please refer to the Current Medication list given to you today.   *If you need a refill on your cardiac medications before your next appointment, please call your pharmacy*   Lab Work: Lab work to be done today--lipid profile, TSH, BNP, CBC If you have labs (blood work) drawn today and your tests are completely normal, you will receive your results only by: [name] MyChart Message (if you have MyChart) OR . A paper copy in the mail If you have any lab test that is abnormal or we need to change your treatment, we will call you to review the results.   Testing/Procedures: none   Follow-Up: At [facility], you and your health needs are our priority.  As part of our continuing mission to provide you with exceptional heart care, we have created designated Provider Care Teams.  These Care Teams include your primary Cardiologist (physician) and Advanced Practice Providers (APPs -  Physician Assistants and Nurse Practitioners) who all work together to provide you with the care you need, when you need it.  We recommend signing up for the patient portal called "MyChart".  Sign up information is provided on this After Visit Summary.  MyChart is used to connect with patients for Virtual Visits (Telemedicine).  Patients are able to view lab/test results, encounter notes, upcoming appointments, etc.  Non-urgent messages can be sent to your provider as well.   To learn more about what you can do with MyChart, go to [email or web address].    Your next appointment:   6 month(s)  The format for your next appointment:   In Person  Provider:   You may see Larae Grooms, MD or one of the following Advanced Practice Providers on your designated Care Team:    Melina Copa, PA-C  Ermalinda Barrios, PA-C    Other Instructions

## 2020-04-25 LAB — CBC
Hematocrit: 30.1 % — ABNORMAL LOW (ref 37.5–51.0)
Hemoglobin: 9.8 g/dL — ABNORMAL LOW (ref 13.0–17.7)
MCH: 29.2 pg (ref 26.6–33.0)
MCHC: 32.6 g/dL (ref 31.5–35.7)
MCV: 90 fL (ref 79–97)
Platelets: 153 10*3/uL (ref 150–450)
RBC: 3.36 x10E6/uL — ABNORMAL LOW (ref 4.14–5.80)
RDW: 15.3 % (ref 11.6–15.4)
WBC: 7.8 10*3/uL (ref 3.4–10.8)

## 2020-04-25 LAB — LIPID PANEL
Chol/HDL Ratio: 3.8 ratio (ref 0.0–5.0)
Cholesterol, Total: 111 mg/dL (ref 100–199)
HDL: 29 mg/dL — ABNORMAL LOW (ref >39)
LDL Chol Calc (NIH): 65 mg/dL (ref 0–99)
Triglycerides: 87 mg/dL (ref 0–149)
VLDL Cholesterol Cal: 17 mg/dL (ref 5–40)

## 2020-04-25 LAB — PRO B NATRIURETIC PEPTIDE: NT-Pro BNP: 2414 pg/mL — ABNORMAL HIGH (ref 0–486)

## 2020-04-25 LAB — TSH: TSH: 2.18 u[IU]/mL (ref 0.450–4.500)

## 2020-05-03 ENCOUNTER — Encounter: Payer: Self-pay | Admitting: Internal Medicine

## 2020-05-03 ENCOUNTER — Other Ambulatory Visit: Payer: Self-pay

## 2020-05-03 ENCOUNTER — Ambulatory Visit (INDEPENDENT_AMBULATORY_CARE_PROVIDER_SITE_OTHER): Payer: Medicare Other | Admitting: Internal Medicine

## 2020-05-03 DIAGNOSIS — I495 Sick sinus syndrome: Secondary | ICD-10-CM | POA: Diagnosis not present

## 2020-05-03 LAB — CBC WITH DIFFERENTIAL/PLATELET
Basophils Absolute: 0.1 10*3/uL (ref 0.0–0.2)
Basos: 1 %
EOS (ABSOLUTE): 0.2 10*3/uL (ref 0.0–0.4)
Eos: 2 %
Hematocrit: 32.3 % — ABNORMAL LOW (ref 37.5–51.0)
Hemoglobin: 10.4 g/dL — ABNORMAL LOW (ref 13.0–17.7)
Immature Grans (Abs): 0.1 10*3/uL (ref 0.0–0.1)
Immature Granulocytes: 1 %
Lymphocytes Absolute: 3.7 10*3/uL — ABNORMAL HIGH (ref 0.7–3.1)
Lymphs: 39 %
MCH: 28.8 pg (ref 26.6–33.0)
MCHC: 32.2 g/dL (ref 31.5–35.7)
MCV: 90 fL (ref 79–97)
Monocytes Absolute: 0.6 10*3/uL (ref 0.1–0.9)
Monocytes: 6 %
Neutrophils Absolute: 4.9 10*3/uL (ref 1.4–7.0)
Neutrophils: 51 %
Platelets: 175 10*3/uL (ref 150–450)
RBC: 3.61 x10E6/uL — ABNORMAL LOW (ref 4.14–5.80)
RDW: 15.8 % — ABNORMAL HIGH (ref 11.6–15.4)
WBC: 9.5 10*3/uL (ref 3.4–10.8)

## 2020-05-03 LAB — BASIC METABOLIC PANEL
BUN/Creatinine Ratio: 16 (ref 10–24)
BUN: 15 mg/dL (ref 10–36)
CO2: 24 mmol/L (ref 20–29)
Calcium: 9.6 mg/dL (ref 8.6–10.2)
Chloride: 103 mmol/L (ref 96–106)
Creatinine, Ser: 0.91 mg/dL (ref 0.76–1.27)
Glucose: 101 mg/dL — ABNORMAL HIGH (ref 65–99)
Potassium: 4.5 mmol/L (ref 3.5–5.2)
Sodium: 141 mmol/L (ref 134–144)
eGFR: 80 mL/min/{1.73_m2} (ref >59)

## 2020-05-03 NOTE — H&P (View-Only) (Signed)
    HPI [name] is referred by Dr. Varansi and Ricky Fenton for evaluation of symptomatic tachy-brady syndrome. He has known CAD. He underwent CABG with MAZE. He has not had syncope. He has had marked sinus brady as well as atrial fib with a RVR despite being on a beta blocker with HR's in the 30's and 40's as well as the 130's and 140's. He gets sob with exertion. He did not think he felt much better in NSR after his recent DCCV. Allergies  Allergen Reactions  . Lisinopril Cough  . Amoxicillin Swelling    penile swelling  . Lasix [Furosemide] Other (See Comments)    Pt is unaware of reaction  . Kenalog [Triamcinolone] Rash    Other reaction(s): rash     Current Outpatient Medications  Medication Sig Dispense Refill  . acetaminophen (TYLENOL) 500 MG tablet as needed.    . cholecalciferol (VITAMIN D) 1000 UNITS tablet Take 1,000 Units by mouth 3 (three) times a week.    . losartan (COZAAR) 50 MG tablet Take 1 tablet (50 mg total) by mouth daily. 90 tablet 3  . metoprolol succinate (TOPROL-XL) 50 MG 24 hr tablet Take 1 tablet (50 mg total) by mouth in the morning and at bedtime. 180 tablet 3  . omeprazole (PRILOSEC) 20 MG capsule Take 20 mg by mouth daily.    . Potassium 99 MG TABS Take 99 mg by mouth 2 (two) times a week.    . rosuvastatin (CRESTOR) 20 MG tablet Take 1 tablet (20 mg total) by mouth daily. 90 tablet 3  . traMADol (ULTRAM) 50 MG tablet as needed.    . vitamin C (ASCORBIC ACID) 500 MG tablet Take 500 mg by mouth 3 (three) times a week.    . warfarin (COUMADIN) 5 MG tablet Take 1 tablet by mouth daily as directed by the coumadin clinic. 100 tablet 1   No current facility-administered medications for this visit.     Past Medical History:  Diagnosis Date  . Aortic stenosis    a. s/p tissue AVR 05/2013;  b. Echo (06/2013):  Mod LVH, EF 60-65%, no RWMA, Gr 2 DD, AVR ok (mean 13 mmHg), MAC, mild MR, mod LAE, mild RAE, PASP 46 mmHg (mild pulmo HTN)  . Blood loss  anemia    a. 05/2009 a/w GIB.  . CAD (coronary artery disease)    a. Cath 04/2009: BMS to LCx 04/2009; CTO of RCA with L-R collaterals, 40% ostial diag.  . Carpal tunnel syndrome    bilateral, carpal tunnel release x 2  . Esophageal ulcer    a. 05/2009 with hemorrhage - injected with epinephrine-Dr. Robert Buccini.  . Esophagitis    a. 05/2009 a/w GIB.  . GERD (gastroesophageal reflux disease)    EGD, Dr. Butler--[place] 2008  . GI bleed    a. 05/2009 as above: with associated anemia. Lower esophageal ulcer with extensive erosive esophagitis and large clot by EGD 05/2009.   . Hx of colonoscopy 2008   Dr. Butler-[place], polyps- 5 yr f/u recommended  . Hyperlipidemia   . Hypertension   . PAF (paroxysmal atrial fibrillation) ([facility])    a. Post-op AVR;  b. 04/2014 recurrent.  . Perforated ear drum    right ear drum, tube in left ear drum, Dr. Rosen  . Polymyalgia rheumatica ([facility])   . Prostate cancer ([facility])   . Vitamin D deficiency     ROS:   All systems reviewed and negative except as noted in   the HPI.   Past Surgical History:  Procedure Laterality Date  . AORTIC VALVE REPLACEMENT N/A 05/12/2013   Procedure: AORTIC VALVE REPLACEMENT (AVR);  Surgeon: Peter Van Trigt, MD;  [facility];  Service: Open Heart Surgery;  Laterality: N/A;  . CARDIAC CATHETERIZATION  2011  . CARDIOVERSION N/A 04/12/2020   Procedure: CARDIOVERSION;  Surgeon: Nelson, Katarina H, MD;  Location: [facility] ENDOSCOPY;  Service: Cardiovascular;  Laterality: N/A;  . CORONARY ARTERY BYPASS GRAFT N/A 05/12/2013   Procedure: CORONARY ARTERY BYPASS GRAFTING (CABG);  Surgeon: Peter Van Trigt, MD;  Location: [facility];  Service: Open Heart Surgery;  Laterality: N/A;  CABG x 1 using left leg greater saphenous vein harvested endoscopically  . INTRAOPERATIVE TRANSESOPHAGEAL ECHOCARDIOGRAM N/A 05/12/2013   Procedure: INTRAOPERATIVE TRANSESOPHAGEAL ECHOCARDIOGRAM;  Surgeon: Peter Van Trigt, MD;  [facility];  Service: Open Heart Surgery;   Laterality: N/A;  . LEFT AND RIGHT HEART CATHETERIZATION WITH CORONARY ANGIOGRAM N/A 05/09/2013   Procedure: LEFT AND RIGHT HEART CATHETERIZATION WITH CORONARY ANGIOGRAM;  Surgeon: Jonathan J Berry, MD;  Location: [facility] CATH LAB;  Service: Cardiovascular;  Laterality: N/A;  . MAZE N/A 05/12/2013   Procedure: MAZE;  Surgeon: Peter Van Trigt, MD;  [facility];  Service: Open Heart Surgery;  Laterality: N/A;  . PROSTATECTOMY    . TEE WITHOUT CARDIOVERSION N/A 04/12/2020   Procedure: TRANSESOPHAGEAL ECHOCARDIOGRAM (TEE);  Surgeon: Nelson, Katarina H, MD;  Location: [facility] ENDOS[facility];  Service: Cardiovascular;  Laterality: N/A;     Family History  Problem Relation Age of Onset  . CVA Mother   . Kidney disease Father   . Breast cancer Daughter      Social History   Socioeconomic History  . Marital status: Married    Spouse name: Not on file  . Number of children: Not on file  . Years of education: Not on file  . Highest education level: Not on file  Occupational History  . Not on file  Tobacco Use  . Smoking status: Never Smoker  . Smokeless tobacco: Never Used  Vaping Use  . Vaping Use: Never used  Substance and Sexual Activity  . Alcohol use: No  . Drug use: No  . Sexual activity: Not on file  Other Topics Concern  . Not on file  Social History Narrative  . Not on file   Social Determinants of Health   Financial Resource Strain: Not on file  Food Insecurity: Not on file  Transportation Needs: Not on file  Physical Activity: Not on file  Stress: Not on file  Social Connections: Not on file  Intimate Partner Violence: Not on file     BP 132/84   Pulse 84   Ht 5' 9" (1.753 m)   Wt 173 lb 3.2 oz (78.6 kg)   SpO2 94%   BMI 25.58 kg/m   Physical Exam:  Well appearing NAD HEENT: Unremarkable Neck:  No JVD, no thyromegally Lymphatics:  No adenopathy Back:  No CVA tenderness Lungs:  Clear with no wheezes.  HEART:  IRegular rate rhythm, no murmurs, no rubs, no  clicks Abd:  soft, positive bowel sounds, no organomegally, no rebound, no guarding Ext:  2 plus pulses, no edema, no cyanosis, no clubbing Skin:  No rashes no nodules Neuro:  CN II through XII intact, motor grossly intact    Assess/Plan: 1. Atrial fib - I suspect a strategy of rate control is most reasonable. Unfortunately he goes both slow and fast. We will continue his beta blocker   and once his PPM is in place, plan to uptitrate. 2. Sinus node dysfunction/tachy-brady syndrome - PPM insertion is recommended 3. CAD - he denies anginal symptoms.  4. Coags - he has had no bleeding on coumadin. We will hold for a couple of days prior to ppm insertion.  Cannon Arreola,MD 

## 2020-05-03 NOTE — Progress Notes (Signed)
HPI Robert [name] is referred by Dr. Beau Fanny and Adline Peals for evaluation of symptomatic tachy-brady syndrome. He has known CAD. He underwent CABG with MAZE. He has not had syncope. He has had marked sinus brady as well as atrial fib with a RVR despite being on a beta blocker with HR's in the 30's and 40's as well as the 130's and 140's. He gets sob with exertion. He did not think he felt much better in NSR after his recent DCCV. Allergies  Allergen Reactions  . Lisinopril Cough  . Amoxicillin Swelling    penile swelling  . Lasix [Furosemide] Other (See Comments)    Pt is unaware of reaction  . Kenalog [Triamcinolone] Rash    Other reaction(s): rash     Current Outpatient Medications  Medication Sig Dispense Refill  . acetaminophen (TYLENOL) 500 MG tablet as needed.    . cholecalciferol (VITAMIN D) 1000 UNITS tablet Take 1,000 Units by mouth 3 (three) times a week.    . losartan (COZAAR) 50 MG tablet Take 1 tablet (50 mg total) by mouth daily. 90 tablet 3  . metoprolol succinate (TOPROL-XL) 50 MG 24 hr tablet Take 1 tablet (50 mg total) by mouth in the morning and at bedtime. 180 tablet 3  . omeprazole (PRILOSEC) 20 MG capsule Take 20 mg by mouth daily.    . Potassium 99 MG TABS Take 99 mg by mouth 2 (two) times a week.    . rosuvastatin (CRESTOR) 20 MG tablet Take 1 tablet (20 mg total) by mouth daily. 90 tablet 3  . traMADol (ULTRAM) 50 MG tablet as needed.    . vitamin C (ASCORBIC ACID) 500 MG tablet Take 500 mg by mouth 3 (three) times a week.    . warfarin (COUMADIN) 5 MG tablet Take 1 tablet by mouth daily as directed by the coumadin clinic. 100 tablet 1   No current facility-administered medications for this visit.     Past Medical History:  Diagnosis Date  . Aortic stenosis    a. s/p tissue AVR 05/2013;  b. Echo (06/2013):  Mod LVH, EF 60-65%, no RWMA, Gr 2 DD, AVR ok (mean 13 mmHg), MAC, mild MR, mod LAE, mild RAE, PASP 46 mmHg (mild pulmo HTN)  . Blood loss  anemia    a. 05/2009 a/w GIB.  [name] CAD (coronary artery disease)    a. Cath 04/2009: BMS to LCx 04/2009; CTO of RCA with L-R collaterals, 40% ostial diag.  . Carpal tunnel syndrome    bilateral, carpal tunnel release x 2  . Esophageal ulcer    a. 05/2009 with hemorrhage - injected with epinephrine-Dr. Herbie Baltimore Buccini.  . Esophagitis    a. 05/2009 a/w GIB.  [name] GERD (gastroesophageal reflux disease)    EGD, Dr. Toney Rakes 2008  . GI bleed    a. 05/2009 as above: with associated anemia. Lower esophageal ulcer with extensive erosive esophagitis and large clot by EGD 05/2009.   [name] Hx of colonoscopy 2008   Dr. Toney Rakes, polyps- 5 yr f/u recommended  . Hyperlipidemia   . Hypertension   . PAF (paroxysmal atrial fibrillation) ([place])    a. Post-op AVR;  b. 04/2014 recurrent.  . Perforated ear drum    right ear drum, tube in left ear drum, Dr. Constance Holster  . Polymyalgia rheumatica ([place])   . Prostate cancer ([place])   . Vitamin D deficiency     ROS:   All systems reviewed and negative except as noted in  the HPI.   Past Surgical History:  Procedure Laterality Date  . AORTIC VALVE REPLACEMENT N/A 05/12/2013   Procedure: AORTIC VALVE REPLACEMENT (AVR);  Surgeon: Ivin Poot, MD;  Location: [place];  Service: Open Heart Surgery;  Laterality: N/A;  . CARDIAC CATHETERIZATION  2011  . CARDIOVERSION N/A 04/12/2020   Procedure: CARDIOVERSION;  Surgeon: Dorothy Spark, MD;  Location: [place];  Service: Cardiovascular;  Laterality: N/A;  . CORONARY ARTERY BYPASS GRAFT N/A 05/12/2013   Procedure: CORONARY ARTERY BYPASS GRAFTING (CABG);  Surgeon: Ivin Poot, MD;  Location: [place];  Service: Open Heart Surgery;  Laterality: N/A;  CABG x 1 using left leg greater saphenous vein harvested endoscopically  . INTRAOPERATIVE TRANSESOPHAGEAL ECHOCARDIOGRAM N/A 05/12/2013   Procedure: INTRAOPERATIVE TRANSESOPHAGEAL ECHOCARDIOGRAM;  Surgeon: Ivin Poot, MD;  Location: [place];  Service: Open Heart Surgery;   Laterality: N/A;  . LEFT AND RIGHT HEART CATHETERIZATION WITH CORONARY ANGIOGRAM N/A 05/09/2013   Procedure: LEFT AND RIGHT HEART CATHETERIZATION WITH CORONARY ANGIOGRAM;  Surgeon: Lorretta Harp, MD;  Location: [facility] CATH LAB;  Service: Cardiovascular;  Laterality: N/A;  . MAZE N/A 05/12/2013   Procedure: MAZE;  Surgeon: Ivin Poot, MD;  Location: [place];  Service: Open Heart Surgery;  Laterality: N/A;  . PROSTATECTOMY    . TEE WITHOUT CARDIOVERSION N/A 04/12/2020   Procedure: TRANSESOPHAGEAL ECHOCARDIOGRAM (TEE);  Surgeon: Dorothy Spark, MD;  Location[facility] ENDOSCOPY;  Service: Cardiovascular;  Laterality: N/A;     Family History  Problem Relation Age of Onset  . CVA Mother   . Kidney disease Father   . Breast cancer Daughter      Social History   Socioeconomic History  . Marital status: Married    Spouse name: Not on file  . Number of children: Not on file  . Years of education: Not on file  . Highest education level: Not on file  Occupational History  . Not on file  Tobacco Use  . Smoking status: Never Smoker  . Smokeless tobacco: Never Used  Vaping Use  . Vaping Use: Never used  Substance and Sexual Activity  . Alcohol use: No  . Drug use: No  . Sexual activity: Not on file  Other Topics Concern  . Not on file  Social History Narrative  . Not on file   Social Determinants of Health   Financial Resource Strain: Not on file  Food Insecurity: Not on file  Transportation Needs: Not on file  Physical Activity: Not on file  Stress: Not on file  Social Connections: Not on file  Intimate Partner Violence: Not on file     BP 132/84   Pulse 84   Ht _0  (1.753 m)   Wt 173 lb 3.2 oz (78.6 kg)   SpO2 94%   BMI 25.58 kg/m   Physical Exam:  Well appearing NAD HEENT: Unremarkable Neck:  No JVD, no thyromegally Lymphatics:  No adenopathy Back:  No CVA tenderness Lungs:  Clear with no wheezes.  HEART:  IRegular rate rhythm, no murmurs, no rubs, no  clicks Abd:  soft, positive bowel sounds, no organomegally, no rebound, no guarding Ext:  2 plus pulses, no edema, no cyanosis, no clubbing Skin:  No rashes no nodules Neuro:  CN II through XII intact, motor grossly intact    Assess/Plan: 1. Atrial fib - I suspect a strategy of rate control is most reasonable. Unfortunately he goes both slow and fast. We will continue his beta blocker  and once his PPM is in place, plan to uptitrate. 2. Sinus node dysfunction/tachy-brady syndrome - PPM insertion is recommended 3. CAD - he denies anginal symptoms.  4. Coags - he has had no bleeding on coumadin. We will hold for a couple of days prior to ppm insertion.  Robert Overlie Dannelle Rhymes,MD

## 2020-05-03 NOTE — Patient Instructions (Addendum)
Medication Instructions:  Your physician recommends that you continue on your current medications as directed. Please refer to the Current Medication list given to you today.  Labwork: None ordered.  Testing/Procedures: None ordered.  Follow-Up:  SEE INSTRUCTION LETTER  Any Other Special Instructions Will Be Listed Below (If Applicable).  If you need a refill on your cardiac medications before your next appointment, please call your pharmacy.    Pacemaker Implantation, Adult Pacemaker implantation is a procedure to place a pacemaker inside the chest. A pacemaker is a small computer that sends electrical signals to the heart and helps the heart beat normally. A pacemaker also stores information about heart rhythms. You may need pacemaker implantation if you have:  A slow heartbeat (bradycardia).  Loss of consciousness that happens repeatedly (syncope) or repeated episodes of dizziness or light-headedness because of an irregular heart rate.  Shortness of breath (dyspnea) due to heart problems. The pacemaker usually attaches to your heart through a wire called a lead. One or two leads may be needed. There are different types of pacemakers:  Transvenous pacemaker. This type is placed under the skin or muscle of your upper chest area. The lead goes through a vein in the chest area to reach the inside of the heart.  Epicardial pacemaker. This type is placed under the skin or muscle of your chest or abdomen. The lead goes through your chest to the outside of the heart. Tell a health care provider about:  Any allergies you have.  All medicines you are taking, including vitamins, herbs, eye drops, creams, and over-the-counter medicines.  Any problems you or family members have had with anesthetic medicines.  Any blood or bone disorders you have.  Any surgeries you have had.  Any medical conditions you have.  Whether you are pregnant or may be pregnant. What are the  risks? Generally, this is a safe procedure. However, problems may occur, including:  Infection.  Bleeding.  Failure of the pacemaker or the lead.  Collapse of a lung or bleeding into a lung.  Blood clot inside a blood vessel with a lead.  Damage to the heart.  Infection inside the heart (endocarditis).  Allergic reactions to medicines. What happens before the procedure? Staying hydrated Follow instructions from your health care provider about hydration, which may include:  Up to 2 hours before the procedure - you may continue to drink clear liquids, such as water, clear fruit juice, black coffee, and plain tea.   Eating and drinking restrictions Follow instructions from your health care provider about eating and drinking, which may include:  8 hours before the procedure - stop eating heavy meals or foods, such as meat, fried foods, or fatty foods.  6 hours before the procedure - stop eating light meals or foods, such as toast or cereal.  6 hours before the procedure - stop drinking milk or drinks that contain milk.  2 hours before the procedure - stop drinking clear liquids. Medicines Ask your health care provider about:  Changing or stopping your regular medicines. This is especially important if you are taking diabetes medicines or blood thinners.  Taking medicines such as aspirin and ibuprofen. These medicines can thin your blood. Do not take these medicines unless your health care provider tells you to take them.  Taking over-the-counter medicines, vitamins, herbs, and supplements. Tests You may have:  A heart evaluation. This may include: ? An electrocardiogram (ECG). This involves placing patches on your skin to check your heart rhythm. ?   A chest X-ray. ? An echocardiogram. This is a test that uses sound waves (ultrasound) to produce an image of the heart. ? A cardiac rhythm monitor. This is used to record your heart rhythm and any events for a longer period of  time.  Blood tests.  Genetic testing. General instructions  Do not use any products that contain nicotine or tobacco for at least 4 weeks before the procedure. These products include cigarettes, e-cigarettes, and chewing tobacco. If you need help quitting, ask your health care provider.  Ask your health care provider: ? How your surgery site will be marked. ? What steps will be taken to help prevent infection. These steps may include:  Removing hair at the surgery site.  Washing skin with a germ-killing soap.  Receiving antibiotic medicine.  Plan to have someone take you home from the hospital or clinic.  If you will be going home right after the procedure, plan to have someone with you for 24 hours. What happens during the procedure?  An IV will be inserted into one of your veins.  You will be given one or more of the following: ? A medicine to help you relax (sedative). ? A medicine to numb the area (local anesthetic). ? A medicine to make you fall asleep (general anesthetic).  The next steps vary depending on the type of pacemaker you will be getting. ? If you are getting a transvenous pacemaker:  An incision will be made in your upper chest.  A pocket will be made for the pacemaker. It may be placed under the skin or between layers of muscle.  The lead will be inserted into a blood vessel that goes to the heart.  While X-rays are taken by an imaging machine (fluoroscopy), the lead will be advanced through the vein to the inside of your heart.  The other end of the lead will be tunneled under the skin and attached to the pacemaker. ? If you are getting an epicardial pacemaker:  An incision will be made near your ribs or breastbone (sternum) for the lead.  The lead will be attached to the outside of your heart.  Another incision will be made in your chest or upper abdomen to create a pocket for the pacemaker.  The free end of the lead will be tunneled under the  skin and attached to the pacemaker.  The transvenous or epicardial pacemaker will be tested. Imaging studies may be done to check the lead position.  The incisions will be closed with stitches (sutures), adhesive strips, or skin glue.  Bandages (dressings) will be placed over the incisions. The procedure may vary among health care providers and hospitals. What happens after the procedure?  Your blood pressure, heart rate, breathing rate, and blood oxygen level will be monitored until you leave the hospital or clinic.  You may be given antibiotics.  You will be given pain medicine.  An ECG and chest X-rays will be done.  You may need to wear a continuous type of ECG (Holter monitor) to check your heart rhythm.  Your health care provider will program the pacemaker.  If you were given a sedative during the procedure, it can affect you for several hours. Do not drive or operate machinery until your health care provider says that it is safe.  You will be given a pacemaker identification card. This card lists the implant date, device model, and manufacturer of your pacemaker. Summary  A pacemaker is a small computer that sends   electrical signals to the heart and helps the heart beat normally.  There are different types of pacemakers. A pacemaker may be placed under the skin or muscle of your chest or abdomen.  Follow instructions from your health care provider about eating and drinking and about taking medicines before the procedure. This information is not intended to replace advice given to you by your health care provider. Make sure you discuss any questions you have with your health care provider. Document Revised: 01/19/2019 Document Reviewed: 01/19/2019 Elsevier Patient Education  2021 Elsevier Inc.  

## 2020-05-07 ENCOUNTER — Other Ambulatory Visit: Payer: Self-pay | Admitting: Interventional Cardiology

## 2020-05-08 ENCOUNTER — Other Ambulatory Visit: Payer: Self-pay

## 2020-05-08 ENCOUNTER — Ambulatory Visit (INDEPENDENT_AMBULATORY_CARE_PROVIDER_SITE_OTHER): Payer: Medicare Other

## 2020-05-08 DIAGNOSIS — Z5181 Encounter for therapeutic drug level monitoring: Secondary | ICD-10-CM | POA: Diagnosis not present

## 2020-05-08 DIAGNOSIS — I4819 Other persistent atrial fibrillation: Secondary | ICD-10-CM | POA: Diagnosis not present

## 2020-05-08 DIAGNOSIS — I480 Paroxysmal atrial fibrillation: Secondary | ICD-10-CM | POA: Diagnosis not present

## 2020-05-08 LAB — POCT INR: INR: 2.2 (ref 2.0–3.0)

## 2020-05-08 MED ORDER — WARFARIN SODIUM 5 MG PO TABS: ORAL_TABLET | ORAL | 3 refills | Status: DC

## 2020-05-08 NOTE — Patient Instructions (Signed)
Continue warfarin 1 tablet daily.  INR check 3 weeks Call if scheduled for any procedures or has any new medications  [phone] Pacemaker 3/21  Holding Warfarin 3/19 and 3/20

## 2020-05-17 ENCOUNTER — Other Ambulatory Visit: Payer: Self-pay | Admitting: Interventional Cardiology

## 2020-05-19 ENCOUNTER — Other Ambulatory Visit (HOSPITAL_COMMUNITY)
Admission: RE | Admit: 2020-05-19 | Discharge: 2020-05-19 | Disposition: A | Discharge status: Home / Self Care | Payer: Medicare Other | Source: Ambulatory Visit | Attending: Internal Medicine | Admitting: Internal Medicine

## 2020-05-19 DIAGNOSIS — Z20822 Contact with and (suspected) exposure to covid-19: Secondary | ICD-10-CM | POA: Diagnosis not present

## 2020-05-19 DIAGNOSIS — Z01812 Encounter for preprocedural laboratory examination: Secondary | ICD-10-CM | POA: Diagnosis not present

## 2020-05-20 LAB — SARS CORONAVIRUS 2 (TAT 6-24 HRS): SARS Coronavirus 2: NEGATIVE

## 2020-05-21 ENCOUNTER — Ambulatory Visit (HOSPITAL_COMMUNITY)
Admission: RE | Admit: 2020-05-21 | Discharge: 2020-05-21 | Disposition: A | Discharge status: Home / Self Care | Payer: Medicare Other | Attending: Internal Medicine | Admitting: Internal Medicine

## 2020-05-21 ENCOUNTER — Ambulatory Visit (HOSPITAL_COMMUNITY)
Admission: RE | Disposition: A | Discharge status: Home / Self Care | Payer: Self-pay | Source: Home / Self Care | Attending: Internal Medicine

## 2020-05-21 ENCOUNTER — Other Ambulatory Visit: Payer: Self-pay

## 2020-05-21 ENCOUNTER — Ambulatory Visit (HOSPITAL_COMMUNITY): Payer: Medicare Other

## 2020-05-21 DIAGNOSIS — Z79899 Other long term (current) drug therapy: Secondary | ICD-10-CM | POA: Diagnosis not present

## 2020-05-21 DIAGNOSIS — I4891 Unspecified atrial fibrillation: Secondary | ICD-10-CM | POA: Insufficient documentation

## 2020-05-21 DIAGNOSIS — R001 Bradycardia, unspecified: Secondary | ICD-10-CM | POA: Diagnosis not present

## 2020-05-21 DIAGNOSIS — Z880 Allergy status to penicillin: Secondary | ICD-10-CM | POA: Insufficient documentation

## 2020-05-21 DIAGNOSIS — I700 Atherosclerosis of aorta: Secondary | ICD-10-CM | POA: Diagnosis not present

## 2020-05-21 DIAGNOSIS — Z7901 Long term (current) use of anticoagulants: Secondary | ICD-10-CM | POA: Insufficient documentation

## 2020-05-21 DIAGNOSIS — Z950 Presence of cardiac pacemaker: Secondary | ICD-10-CM

## 2020-05-21 DIAGNOSIS — I495 Sick sinus syndrome: Secondary | ICD-10-CM | POA: Insufficient documentation

## 2020-05-21 DIAGNOSIS — R0602 Shortness of breath: Secondary | ICD-10-CM | POA: Diagnosis not present

## 2020-05-21 DIAGNOSIS — Z9889 Other specified postprocedural states: Secondary | ICD-10-CM | POA: Diagnosis not present

## 2020-05-21 DIAGNOSIS — I2510 Atherosclerotic heart disease of native coronary artery without angina pectoris: Secondary | ICD-10-CM | POA: Insufficient documentation

## 2020-05-21 DIAGNOSIS — I517 Cardiomegaly: Secondary | ICD-10-CM | POA: Diagnosis not present

## 2020-05-21 DIAGNOSIS — Z888 Allergy status to other drugs, medicaments and biological substances status: Secondary | ICD-10-CM | POA: Diagnosis not present

## 2020-05-21 DIAGNOSIS — Z951 Presence of aortocoronary bypass graft: Secondary | ICD-10-CM | POA: Diagnosis not present

## 2020-05-21 HISTORY — PX: PACEMAKER IMPLANT: EP1218

## 2020-05-21 LAB — PROTIME-INR
INR: 1.2 (ref 0.8–1.2)
Prothrombin Time: 15.1 seconds (ref 11.4–15.2)

## 2020-05-21 SURGERY — PACEMAKER IMPLANT

## 2020-05-21 MED ORDER — CHLORHEXIDINE GLUCONATE 4 % EX LIQD
4.0000 "application " | Freq: Once | CUTANEOUS | Status: DC
  Filled 2020-05-21: qty 60

## 2020-05-21 MED ORDER — SODIUM CHLORIDE 0.9 % IV SOLN
INTRAVENOUS | Status: AC
  Filled 2020-05-21: qty 2

## 2020-05-21 MED ORDER — HEPARIN (PORCINE) IN NACL 1000-0.9 UT/500ML-% IV SOLN
INTRAVENOUS | Status: DC | PRN
  Administered 2020-05-21: 500 mL

## 2020-05-21 MED ORDER — MIDAZOLAM HCL 5 MG/5ML IJ SOLN
INTRAMUSCULAR | Status: DC | PRN
  Administered 2020-05-21 (×2): 1 mg via INTRAVENOUS

## 2020-05-21 MED ORDER — FENTANYL CITRATE (PF) 100 MCG/2ML IJ SOLN
INTRAMUSCULAR | Status: AC
  Filled 2020-05-21: qty 2

## 2020-05-21 MED ORDER — LIDOCAINE HCL 1 % IJ SOLN
INTRAMUSCULAR | Status: AC
  Filled 2020-05-21: qty 60

## 2020-05-21 MED ORDER — HEPARIN (PORCINE) IN NACL 1000-0.9 UT/500ML-% IV SOLN
INTRAVENOUS | Status: AC
  Filled 2020-05-21: qty 500

## 2020-05-21 MED ORDER — SODIUM CHLORIDE 0.9 % IV SOLN: INTRAVENOUS | Status: DC

## 2020-05-21 MED ORDER — POVIDONE-IODINE 10 % EX SWAB: 2.0000 "application " | Freq: Once | CUTANEOUS | Status: DC

## 2020-05-21 MED ORDER — MIDAZOLAM HCL 5 MG/5ML IJ SOLN
INTRAMUSCULAR | Status: AC
  Filled 2020-05-21: qty 5

## 2020-05-21 MED ORDER — FENTANYL CITRATE (PF) 100 MCG/2ML IJ SOLN
INTRAMUSCULAR | Status: DC | PRN
  Administered 2020-05-21 (×2): 12.5 ug via INTRAVENOUS

## 2020-05-21 MED ORDER — VANCOMYCIN HCL 1000 MG/200ML IV SOLN
1000.0000 mg | INTRAVENOUS | Status: AC
  Administered 2020-05-21: 1000 mg via INTRAVENOUS

## 2020-05-21 MED ORDER — LIDOCAINE HCL (PF) 1 % IJ SOLN
INTRAMUSCULAR | Status: DC | PRN
  Administered 2020-05-21: 60 mL

## 2020-05-21 MED ORDER — SODIUM CHLORIDE 0.9 % IV SOLN
INTRAVENOUS | Status: AC | PRN
  Administered 2020-05-21: 50 mL/h via INTRAVENOUS

## 2020-05-21 MED ORDER — SODIUM CHLORIDE 0.9 % IV SOLN
80.0000 mg | INTRAVENOUS | Status: AC
  Administered 2020-05-21: 80 mg

## 2020-05-21 MED ORDER — ACETAMINOPHEN 325 MG PO TABS: 325.0000 mg | ORAL_TABLET | ORAL | Status: DC | PRN

## 2020-05-21 MED ORDER — VANCOMYCIN HCL IN DEXTROSE 1-5 GM/200ML-% IV SOLN
INTRAVENOUS | Status: AC
  Filled 2020-05-21: qty 200

## 2020-05-21 MED ORDER — ONDANSETRON HCL 4 MG/2ML IJ SOLN: 4.0000 mg | Freq: Four times a day (QID) | INTRAMUSCULAR | Status: DC | PRN

## 2020-05-21 SURGICAL SUPPLY — 7 items
CABLE SURGICAL S-101-97-12 (CABLE) ×2 IMPLANT
LEAD TENDRIL MRI 52CM LPA1200M (Lead) ×2 IMPLANT
LEAD TENDRIL MRI 58CM LPA1200M (Lead) ×2 IMPLANT
PACEMAKER ASSURITY DR-RF (Pacemaker) ×2 IMPLANT
PAD PRO RADIOLUCENT 2001M-C (PAD) ×2 IMPLANT
SHEATH 8FR PRELUDE SNAP 13 (SHEATH) ×4 IMPLANT
TRAY PACEMAKER INSERTION (PACKS) ×2 IMPLANT

## 2020-05-21 NOTE — Progress Notes (Signed)
Pt daughter called and updated, informed that I would call her later with D/c instructions.

## 2020-05-21 NOTE — Progress Notes (Signed)
Discharge instructions reviewed with pt daughter (via telephone). Voices understanding.

## 2020-05-21 NOTE — Interval H&P Note (Signed)
History and Physical Interval Note:  05/21/2020 10:55 AM  Robert [name]  has presented today for surgery, with the diagnosis of bradychardia.  The various methods of treatment have been discussed with the patient and family. After consideration of risks, benefits and other options for treatment, the patient has consented to  Procedure(s): PACEMAKER IMPLANT (N/A) as a surgical intervention.  The patient's history has been reviewed, patient examined, no change in status, stable for surgery.  I have reviewed the patient's chart and labs.  Questions were answered to the patient's satisfaction.     Cristopher Peru

## 2020-05-21 NOTE — Discharge Instructions (Signed)
After Your Pacemaker   . You have a St. Jude Pacemaker  ACTIVITY . Do not lift your arm above shoulder height for 1 week after your procedure. After 7 days, you may progress as below.  . You should remove your sling 24 hours after your procedure, unless otherwise instructed by your provider.     Monday May 28, 2020  Tuesday May 29, 2020 Wednesday May 30, 2020 Thursday May 31, 2020   . Do not lift, push, pull, or carry anything over 10 pounds with the affected arm until 6 weeks (Monday Jul 02, 2020 ) after your procedure.   . Do NOT DRIVE until you have been seen for your wound check, or as long as instructed by your healthcare provider.   . Ask your healthcare provider when you can go back to work   INCISION/Dressing . If you are on a blood thinner such as Coumadin, Xarelto, Eliquis, Plavix, or Pradaxa please confirm with your provider when this should be resumed.   . If large square, outer bandage is left in place, this can be removed after 24 hours from your procedure. Do not remove steri-strips or glue as below.   . Monitor your Pacemaker site for redness, swelling, and drainage. Call the device clinic at [phone] if you experience these symptoms or fever/chills.  . If your incision is sealed with Steri-strips or staples, you may shower 10 days after your procedure or when told by your provider. Do not remove the steri-strips or let the shower hit directly on your site. You may wash around your site with soap and water.    [name] Avoid lotions, ointments, or perfumes over your incision until it is well-healed.  . You may use a hot tub or a pool AFTER your wound check appointment if the incision is completely closed.  [name] PAcemaker Alerts:  Some alerts are vibratory and others beep. These are NOT emergencies. Please call our office to let us know. If this occurs at night or on weekends, it can wait until the next business day. Send a remote transmission.  . If your device is  capable of reading fluid status (for heart failure), you will be offered monthly monitoring to review this with you.   DEVICE MANAGEMENT . Remote monitoring is used to monitor your pacemaker from home. This monitoring is scheduled every 91 days by our office. It allows [place] to keep an eye on the functioning of your device to ensure it is working properly. You will routinely see your Electrophysiologist annually (more often if necessary).   . You should receive your ID card for your new device in 4-8 weeks. Keep this card with you at all times once received. Consider wearing a medical alert bracelet or necklace.  . Your Pacemaker may be MRI compatible. This will be discussed at your next office visit/wound check.  You should avoid contact with strong electric or magnetic fields.    Do not use amateur (ham) radio equipment or electric (arc) welding torches. MP3 player headphones with magnets should not be used. Some devices are safe to use if held at least 12 inches (30 cm) from your Pacemaker. These include power tools, lawn mowers, and speakers. If you are unsure if something is safe to use, ask your health care provider.   When using your cell phone, hold it to the ear that is on the opposite side from the Pacemaker. Do not leave your cell phone in a pocket over the Pacemaker.  You may safely use electric blankets, heating pads, computers, and microwave ovens.  Call the office right away if:  You have chest pain.  You feel more short of breath than you have felt before.  You feel more light-headed than you have felt before.  Your incision starts to open up.  This information is not intended to replace advice given to you by your health care provider. Make sure you discuss any questions you have with your health care provider.

## 2020-05-22 ENCOUNTER — Telehealth: Payer: Self-pay

## 2020-05-22 ENCOUNTER — Encounter (HOSPITAL_COMMUNITY): Payer: Self-pay | Admitting: Internal Medicine

## 2020-05-22 MED FILL — Lidocaine HCl Local Inj 1%: INTRAMUSCULAR | Qty: 60 | Status: AC

## 2020-05-22 NOTE — Telephone Encounter (Signed)
Follow-up after same day discharge: Implant date: 05/21/2020 MD: Cristopher Peru, MD Device: St. Jude Assurity Location: Left Chest   Wound check visit: 05/31/2020 @ 11:20 91 day MD follow-up: 09/06/20 @ 2:45  Remote Transmission received:Yes  Dressing removed: Will remove this evening (24 hours this afternoon). Also will remote sling. Patient advised to call if he sees any bleeding, drainage, swelling, redness, fever or chills.

## 2020-05-22 NOTE — Telephone Encounter (Signed)
-----   Message from Shirley Friar, PA-C sent at 05/21/2020  1:46 PM EDT ----- Same day discharge 05/21/20 PPM GT

## 2020-05-29 ENCOUNTER — Other Ambulatory Visit: Payer: Self-pay

## 2020-05-29 ENCOUNTER — Ambulatory Visit (INDEPENDENT_AMBULATORY_CARE_PROVIDER_SITE_OTHER): Payer: Medicare Other

## 2020-05-29 DIAGNOSIS — I4819 Other persistent atrial fibrillation: Secondary | ICD-10-CM

## 2020-05-29 DIAGNOSIS — Z5181 Encounter for therapeutic drug level monitoring: Secondary | ICD-10-CM

## 2020-05-29 DIAGNOSIS — I480 Paroxysmal atrial fibrillation: Secondary | ICD-10-CM | POA: Diagnosis not present

## 2020-05-29 LAB — POCT INR: INR: 1.7 — AB (ref 2.0–3.0)

## 2020-05-29 NOTE — Patient Instructions (Signed)
Take 2 tablets and then Continue warfarin 1 tablet daily.  INR check 4 weeks Call if scheduled for any procedures or has any new medications  [phone]

## 2020-05-31 ENCOUNTER — Other Ambulatory Visit: Payer: Self-pay

## 2020-05-31 ENCOUNTER — Ambulatory Visit (INDEPENDENT_AMBULATORY_CARE_PROVIDER_SITE_OTHER): Payer: Medicare Other | Admitting: Emergency Medicine

## 2020-05-31 DIAGNOSIS — I495 Sick sinus syndrome: Secondary | ICD-10-CM

## 2020-05-31 LAB — CUP PACEART INCLINIC DEVICE CHECK
Battery Remaining Longevity: 134 mo
Battery Voltage: 3.13 V
Brady Statistic RA Percent Paced: 0 %
Brady Statistic RV Percent Paced: 3.1 %
Date Time Interrogation Session: 20220331150631
Implantable Lead Implant Date: 20220321
Implantable Lead Implant Date: 20220321
Implantable Lead Location: 753859
Implantable Lead Location: 753860
Implantable Pulse Generator Implant Date: 20220321
Lead Channel Impedance Value: 475 Ohm
Lead Channel Impedance Value: 525 Ohm
Lead Channel Pacing Threshold Amplitude: 0.75 V
Lead Channel Pacing Threshold Amplitude: 0.75 V
Lead Channel Pacing Threshold Pulse Width: 0.4 ms
Lead Channel Pacing Threshold Pulse Width: 0.4 ms
Lead Channel Sensing Intrinsic Amplitude: 1.1 mV
Lead Channel Sensing Intrinsic Amplitude: 12 mV
Lead Channel Setting Pacing Amplitude: 3.5 V
Lead Channel Setting Pacing Amplitude: 3.5 V
Lead Channel Setting Pacing Pulse Width: 0.4 ms
Lead Channel Setting Sensing Sensitivity: 2 mV
Pulse Gen Model: 2272
Pulse Gen Serial Number: 3909458

## 2020-05-31 NOTE — Progress Notes (Signed)
Wound check appointment. Steri-strips removed. Wound without redness or edema. Incision edges approximated, wound well healed. Normal device function. Sensing, RV threshold and impedances consistent with implant measurements. Device programmed at 3.5V for extra safety margin until 3 month visit. Histogram distribution appropriate for patient and level of activity. AT/AF Burden >99%, +OAC.  No high ventricular rates noted. Patient educated about wound care, arm mobility, lifting restrictions. ROV with Dr. Lovena Le 09/06/20.  Patient is enrolled in remote monitoring, next scheduled check 08/21/20.

## 2020-06-04 ENCOUNTER — Ambulatory Visit (HOSPITAL_COMMUNITY): Payer: Medicare Other | Admitting: Physician Assistant

## 2020-06-26 ENCOUNTER — Other Ambulatory Visit: Payer: Self-pay

## 2020-06-26 ENCOUNTER — Ambulatory Visit (INDEPENDENT_AMBULATORY_CARE_PROVIDER_SITE_OTHER): Payer: Medicare Other

## 2020-06-26 DIAGNOSIS — Z5181 Encounter for therapeutic drug level monitoring: Secondary | ICD-10-CM | POA: Diagnosis not present

## 2020-06-26 DIAGNOSIS — I480 Paroxysmal atrial fibrillation: Secondary | ICD-10-CM

## 2020-06-26 DIAGNOSIS — I4819 Other persistent atrial fibrillation: Secondary | ICD-10-CM | POA: Diagnosis not present

## 2020-06-26 LAB — POCT INR: INR: 2.1 (ref 2.0–3.0)

## 2020-06-26 NOTE — Patient Instructions (Signed)
Continue warfarin 1 tablet daily.  INR check 6 weeks Call if scheduled for any procedures or has any new medications  [phone]

## 2020-07-02 DIAGNOSIS — M353 Polymyalgia rheumatica: Secondary | ICD-10-CM | POA: Diagnosis not present

## 2020-07-02 DIAGNOSIS — Z0000 Encounter for general adult medical examination without abnormal findings: Secondary | ICD-10-CM | POA: Diagnosis not present

## 2020-07-02 DIAGNOSIS — D6869 Other thrombophilia: Secondary | ICD-10-CM | POA: Diagnosis not present

## 2020-07-02 DIAGNOSIS — Z952 Presence of prosthetic heart valve: Secondary | ICD-10-CM | POA: Diagnosis not present

## 2020-07-02 DIAGNOSIS — R32 Unspecified urinary incontinence: Secondary | ICD-10-CM | POA: Diagnosis not present

## 2020-07-02 DIAGNOSIS — E785 Hyperlipidemia, unspecified: Secondary | ICD-10-CM | POA: Diagnosis not present

## 2020-07-02 DIAGNOSIS — I10 Essential (primary) hypertension: Secondary | ICD-10-CM | POA: Diagnosis not present

## 2020-07-02 DIAGNOSIS — D500 Iron deficiency anemia secondary to blood loss (chronic): Secondary | ICD-10-CM | POA: Diagnosis not present

## 2020-07-02 DIAGNOSIS — E559 Vitamin D deficiency, unspecified: Secondary | ICD-10-CM | POA: Diagnosis not present

## 2020-07-02 DIAGNOSIS — Z79899 Other long term (current) drug therapy: Secondary | ICD-10-CM | POA: Diagnosis not present

## 2020-07-02 DIAGNOSIS — Z7189 Other specified counseling: Secondary | ICD-10-CM | POA: Diagnosis not present

## 2020-07-02 DIAGNOSIS — I4891 Unspecified atrial fibrillation: Secondary | ICD-10-CM | POA: Diagnosis not present

## 2020-07-02 DIAGNOSIS — Z1389 Encounter for screening for other disorder: Secondary | ICD-10-CM | POA: Diagnosis not present

## 2020-07-02 DIAGNOSIS — Z8546 Personal history of malignant neoplasm of prostate: Secondary | ICD-10-CM | POA: Diagnosis not present

## 2020-07-04 DIAGNOSIS — C4442 Squamous cell carcinoma of skin of scalp and neck: Secondary | ICD-10-CM | POA: Diagnosis not present

## 2020-07-04 DIAGNOSIS — Z85828 Personal history of other malignant neoplasm of skin: Secondary | ICD-10-CM | POA: Diagnosis not present

## 2020-07-04 DIAGNOSIS — D485 Neoplasm of uncertain behavior of skin: Secondary | ICD-10-CM | POA: Diagnosis not present

## 2020-07-10 DIAGNOSIS — R600 Localized edema: Secondary | ICD-10-CM | POA: Diagnosis not present

## 2020-07-10 DIAGNOSIS — M810 Age-related osteoporosis without current pathological fracture: Secondary | ICD-10-CM | POA: Diagnosis not present

## 2020-07-10 DIAGNOSIS — C61 Malignant neoplasm of prostate: Secondary | ICD-10-CM | POA: Diagnosis not present

## 2020-07-10 DIAGNOSIS — Z79818 Long term (current) use of other agents affecting estrogen receptors and estrogen levels: Secondary | ICD-10-CM | POA: Diagnosis not present

## 2020-07-10 DIAGNOSIS — I4891 Unspecified atrial fibrillation: Secondary | ICD-10-CM | POA: Diagnosis not present

## 2020-07-10 DIAGNOSIS — M85852 Other specified disorders of bone density and structure, left thigh: Secondary | ICD-10-CM | POA: Diagnosis not present

## 2020-07-10 DIAGNOSIS — R000 Tachycardia, unspecified: Secondary | ICD-10-CM | POA: Diagnosis not present

## 2020-07-10 DIAGNOSIS — Z7901 Long term (current) use of anticoagulants: Secondary | ICD-10-CM | POA: Diagnosis not present

## 2020-07-10 DIAGNOSIS — Z191 Hormone sensitive malignancy status: Secondary | ICD-10-CM | POA: Diagnosis not present

## 2020-07-10 DIAGNOSIS — Z79899 Other long term (current) drug therapy: Secondary | ICD-10-CM | POA: Diagnosis not present

## 2020-08-07 ENCOUNTER — Other Ambulatory Visit: Payer: Self-pay

## 2020-08-07 ENCOUNTER — Ambulatory Visit (INDEPENDENT_AMBULATORY_CARE_PROVIDER_SITE_OTHER): Payer: Medicare Other

## 2020-08-07 DIAGNOSIS — I4819 Other persistent atrial fibrillation: Secondary | ICD-10-CM

## 2020-08-07 DIAGNOSIS — I480 Paroxysmal atrial fibrillation: Secondary | ICD-10-CM

## 2020-08-07 DIAGNOSIS — Z5181 Encounter for therapeutic drug level monitoring: Secondary | ICD-10-CM | POA: Diagnosis not present

## 2020-08-07 LAB — POCT INR: INR: 2.7 (ref 2.0–3.0)

## 2020-08-07 NOTE — Patient Instructions (Signed)
Continue warfarin 1 tablet daily.  INR check 6 weeks Call if scheduled for any procedures or has any new medications  [phone]

## 2020-08-21 ENCOUNTER — Ambulatory Visit (INDEPENDENT_AMBULATORY_CARE_PROVIDER_SITE_OTHER): Payer: Medicare Other

## 2020-08-21 DIAGNOSIS — R198 Other specified symptoms and signs involving the digestive system and abdomen: Secondary | ICD-10-CM | POA: Diagnosis not present

## 2020-08-21 DIAGNOSIS — R600 Localized edema: Secondary | ICD-10-CM | POA: Diagnosis not present

## 2020-08-21 DIAGNOSIS — H6693 Otitis media, unspecified, bilateral: Secondary | ICD-10-CM | POA: Diagnosis not present

## 2020-08-21 DIAGNOSIS — R0602 Shortness of breath: Secondary | ICD-10-CM | POA: Diagnosis not present

## 2020-08-21 DIAGNOSIS — I4819 Other persistent atrial fibrillation: Secondary | ICD-10-CM

## 2020-08-21 DIAGNOSIS — D509 Iron deficiency anemia, unspecified: Secondary | ICD-10-CM | POA: Diagnosis not present

## 2020-08-22 LAB — CUP PACEART REMOTE DEVICE CHECK
Battery Remaining Longevity: 100 mo
Battery Remaining Percentage: 95.5 %
Battery Voltage: 3.04 V
Brady Statistic AP VP Percent: 0 %
Brady Statistic AP VS Percent: 0 %
Brady Statistic AS VP Percent: 0 %
Brady Statistic AS VS Percent: 0 %
Brady Statistic RA Percent Paced: 1 %
Brady Statistic RV Percent Paced: 3.9 %
Date Time Interrogation Session: 20220621023422
Implantable Lead Implant Date: 20220321
Implantable Lead Implant Date: 20220321
Implantable Lead Location: 753859
Implantable Lead Location: 753860
Implantable Pulse Generator Implant Date: 20220321
Lead Channel Impedance Value: 450 Ohm
Lead Channel Impedance Value: 530 Ohm
Lead Channel Pacing Threshold Amplitude: 0.75 V
Lead Channel Pacing Threshold Pulse Width: 0.4 ms
Lead Channel Sensing Intrinsic Amplitude: 1.1 mV
Lead Channel Sensing Intrinsic Amplitude: 11.3 mV
Lead Channel Setting Pacing Amplitude: 3.5 V
Lead Channel Setting Pacing Amplitude: 3.5 V
Lead Channel Setting Pacing Pulse Width: 0.4 ms
Lead Channel Setting Sensing Sensitivity: 2 mV
Pulse Gen Model: 2272
Pulse Gen Serial Number: 3909458

## 2020-08-28 DIAGNOSIS — Z9622 Myringotomy tube(s) status: Secondary | ICD-10-CM | POA: Diagnosis not present

## 2020-08-28 DIAGNOSIS — H7291 Unspecified perforation of tympanic membrane, right ear: Secondary | ICD-10-CM | POA: Diagnosis not present

## 2020-08-28 DIAGNOSIS — H9213 Otorrhea, bilateral: Secondary | ICD-10-CM | POA: Diagnosis not present

## 2020-09-06 ENCOUNTER — Ambulatory Visit (INDEPENDENT_AMBULATORY_CARE_PROVIDER_SITE_OTHER): Payer: Medicare Other | Admitting: Internal Medicine

## 2020-09-06 ENCOUNTER — Encounter: Payer: Self-pay | Admitting: Internal Medicine

## 2020-09-06 ENCOUNTER — Other Ambulatory Visit: Payer: Self-pay

## 2020-09-06 VITALS — BP 130/84 | HR 117 | Ht 68.0 in | Wt 177.0 lb

## 2020-09-06 DIAGNOSIS — Z950 Presence of cardiac pacemaker: Secondary | ICD-10-CM | POA: Diagnosis not present

## 2020-09-06 DIAGNOSIS — I480 Paroxysmal atrial fibrillation: Secondary | ICD-10-CM | POA: Diagnosis not present

## 2020-09-06 DIAGNOSIS — I495 Sick sinus syndrome: Secondary | ICD-10-CM | POA: Diagnosis not present

## 2020-09-06 DIAGNOSIS — I2510 Atherosclerotic heart disease of native coronary artery without angina pectoris: Secondary | ICD-10-CM | POA: Diagnosis not present

## 2020-09-06 MED ORDER — FUROSEMIDE 40 MG PO TABS: 40.0000 mg | ORAL_TABLET | Freq: Every day | ORAL | 3 refills | Status: DC

## 2020-09-06 MED ORDER — DIGOXIN 125 MCG PO TABS: 0.1250 mg | ORAL_TABLET | Freq: Every day | ORAL | 3 refills | Status: DC

## 2020-09-06 NOTE — Patient Instructions (Addendum)
Medication Instructions:  Your physician has recommended you make the following change in your medication:    START taking Lasix 40 mg-  Take one tablet by mouth daily  2.    START taking DIGOXIN 0.125 mg-  Take one tablet by mouth daily   Labwork: You will come back for lab work on October 04, 2020. BMP and digoxin level.  Testing/Procedures: None ordered.  Follow-Up: Your physician wants you to follow-up in: 6 months with Cristopher Peru, MD or one of the following Advanced Practice Providers on your designated Care Team:   Tommye Standard, [place] Legrand Como "Jonni Sanger" Chalmers Cater, [place]  Remote monitoring is used to monitor your Pacemaker from home. This monitoring reduces the number of office visits required to check your device to one time per year. It allows [place] to keep an eye on the functioning of your device to ensure it is working properly. You are scheduled for a device check from home on 11/20/2020. You may send your transmission at any time that day. If you have a wireless device, the transmission will be sent automatically. After your physician reviews your transmission, you will receive a postcard with your next transmission date.  Any Other Special Instructions Will Be Listed Below (If Applicable).  If you need a refill on your cardiac medications before your next appointment, please call your pharmacy.

## 2020-09-06 NOTE — Progress Notes (Signed)
HPI Mr. Hirano returns today for PPM followup. He is a pleasant 38 man with a h/o symptomatic tachy-brady syndrome, s/p PPM insertion. He has a remote h/o atrial fib and also periods of very slow VR with heart rates in the 30's. He underwent DCCV and had ERAF. He denies palpitations. He has dyspnea with exertion. He is taking qod lasix. He denies edema.   Allergies  Allergen Reactions   Lisinopril Cough   Amoxicillin Swelling    penile swelling   Lasix [Furosemide] Other (See Comments)    Unsure of reaction   Kenalog [Triamcinolone] Rash     Current Outpatient Medications  Medication Sig Dispense Refill   acetaminophen (TYLENOL) 500 MG tablet Take 500-1,000 mg by mouth every 6 (six) hours as needed (pain).     cholecalciferol (VITAMIN D) 1000 UNITS tablet Take 1,000 Units by mouth 3 (three) times a week.     digoxin (LANOXIN) 0.125 MG tablet Take 1 tablet (0.125 mg total) by mouth daily. 90 tablet 3   furosemide (LASIX) 40 MG tablet Take 1 tablet (40 mg total) by mouth daily. 90 tablet 3   losartan (COZAAR) 50 MG tablet TAKE 1 TABLET DAILY 90 tablet 3   metoprolol succinate (TOPROL-XL) 50 MG 24 hr tablet Take 1 tablet (50 mg total) by mouth in the morning and at bedtime. 180 tablet 3   omeprazole (PRILOSEC) 20 MG capsule Take 20 mg by mouth every morning.     Potassium 99 MG TABS Take 99 mg by mouth 2 (two) times a week.     rosuvastatin (CRESTOR) 20 MG tablet Take 1 tablet (20 mg total) by mouth daily. 90 tablet 3   vitamin C (ASCORBIC ACID) 500 MG tablet Take 500 mg by mouth 3 (three) times a week.     warfarin (COUMADIN) 5 MG tablet TAKE 1 TABLET DAILY AS DIRECTED BY THE COUMADIN CLINIC 100 tablet 3   No current facility-administered medications for this visit.     Past Medical History:  Diagnosis Date   Aortic stenosis    a. s/p tissue AVR 05/2013;  b. Echo (06/2013):  Mod LVH, EF 60-65%, no RWMA, Gr 2 DD, AVR ok (mean 13 mmHg), MAC, mild MR, mod LAE, mild RAE, PASP  46 mmHg (mild pulmo HTN)   Blood loss anemia    a. 05/2009 a/w GIB.   CAD (coronary artery disease)    a. Cath 04/2009: BMS to LCx 04/2009; CTO of RCA with L-R collaterals, 40% ostial diag.   Carpal tunnel syndrome    bilateral, carpal tunnel release x 2   Esophageal ulcer    a. 05/2009 with hemorrhage - injected with epinephrine-Dr. Herbie Baltimore Buccini.   Esophagitis    a. 05/2009 a/w GIB.   GERD (gastroesophageal reflux disease)    EGD, Dr. Toney Rakes 2008   GI bleed    a. 05/2009 as above: with associated anemia. Lower esophageal ulcer with extensive erosive esophagitis and large clot by EGD 05/2009.    Hx of colonoscopy 2008   Dr. Toney Rakes, polyps- 5 yr f/u recommended   Hyperlipidemia    Hypertension    PAF (paroxysmal atrial fibrillation) ([place])    a. Post-op AVR;  b. 04/2014 recurrent.   Perforated ear drum    right ear drum, tube in left ear drum, Dr. Constance Holster   Polymyalgia rheumatica [facility])    Prostate cancer ([place])    Vitamin D deficiency     ROS:   All systems reviewed  and negative except as noted in the HPI.   Past Surgical History:  Procedure Laterality Date   AORTIC VALVE REPLACEMENT N/A 05/12/2013   Procedure: AORTIC VALVE REPLACEMENT (AVR);  Surgeon: Ivin Poot, MD;  Location: [facility];  Service: Open Heart Surgery;  Laterality: N/A;   CARDIAC CATHETERIZATION  2011   CARDIOVERSION N/A 04/12/2020   Procedure: CARDIOVERSION;  Surgeon: Dorothy Spark, MD;  Location: [facility];  Service: Cardiovascular;  Laterality: N/A;   CORONARY ARTERY BYPASS GRAFT N/A 05/12/2013   Procedure: CORONARY ARTERY BYPASS GRAFTING (CABG);  Surgeon: Ivin Poot, MD;  Location: [facility];  Service: Open Heart Surgery;  Laterality: N/A;  CABG x 1 using left leg greater saphenous vein harvested endoscopically   INTRAOPERATIVE TRANSESOPHAGEAL ECHOCARDIOGRAM N/A 05/12/2013   Procedure: INTRAOPERATIVE TRANSESOPHAGEAL ECHOCARDIOGRAM;  Surgeon: Ivin Poot, MD;  Location: [facility];  Service:  Open Heart Surgery;  Laterality: N/A;   LEFT AND RIGHT HEART CATHETERIZATION WITH CORONARY ANGIOGRAM N/A 05/09/2013   Procedure: LEFT AND RIGHT HEART CATHETERIZATION WITH CORONARY ANGIOGRAM;  Surgeon: Lorretta Harp, MD;  Location: [facility] CATH LAB;  Service: Cardiovascular;  Laterality: N/A;   MAZE N/A 05/12/2013   Procedure: MAZE;  Surgeon: Ivin Poot, MD;  Location: [facility];  Service: Open Heart Surgery;  Laterality: N/A;   PACEMAKER IMPLANT N/A 05/21/2020   Procedure: PACEMAKER IMPLANT;  Surgeon: Evans Lance, MD;  Location: [facility] CV LAB;  Service: Cardiovascular;  Laterality: N/A;   PROSTATECTOMY     TEE WITHOUT CARDIOVERSION N/A 04/12/2020   Procedure: TRANSESOPHAGEAL ECHOCARDIOGRAM (TEE);  Surgeon: Dorothy Spark, MD;  Location: [facility] ENDOSCOPY;  Service: Cardiovascular;  Laterality: N/A;     Family History  Problem Relation Age of Onset   CVA Mother    Kidney disease Father    Breast cancer Daughter      Social History   Socioeconomic History   Marital status: Married    Spouse name: Not on file   Number of children: Not on file   Years of education: Not on file   Highest education level: Not on file  Occupational History   Not on file  Tobacco Use   Smoking status: Never   Smokeless tobacco: Never  Vaping Use   Vaping Use: Never used  Substance and Sexual Activity   Alcohol use: No   Drug use: No   Sexual activity: Not on file  Other Topics Concern   Not on file  Social History Narrative   Not on file   Social Determinants of Health   Financial Resource Strain: Not on file  Food Insecurity: Not on file  Transportation Needs: Not on file  Physical Activity: Not on file  Stress: Not on file  Social Connections: Not on file  Intimate Partner Violence: Not on file     BP 130/84   Pulse (!) 117   Ht _0  (1.727 m)   Wt 177 lb (80.3 kg)   SpO2 96%   BMI 26.91 kg/m   Physical Exam:  Well appearing NAD HEENT: Unremarkable Neck:  No JVD, no  thyromegally Lymphatics:  No adenopathy Back:  No CVA tenderness Lungs:  Clear HEART:  Regular rate rhythm, no murmurs, no rubs, no clicks Abd:  soft, positive bowel sounds, no organomegally, no rebound, no guarding Ext:  2 plus pulses, no edema, no cyanosis, no clubbing Skin:  No rashes no nodules Neuro:  CN II through XII intact, motor grossly intact  EKG -  atrial fib with a RVR  DEVICE  Normal device function.  See PaceArt for details.   Assess/Plan:  Atrial fib - his rates are not well controlled. He will start digoxin 0.125 mg daily Chronic diastolic heart failure -I asked him to take 40 mg of lasix daily and avoid salty foods. PPM - his St. Jude PM is programmed VVIR.  HTN - his bp is fairly well controlled.  Carleene Overlie [place] Mckamey,MD

## 2020-09-07 ENCOUNTER — Other Ambulatory Visit: Payer: Self-pay | Admitting: Internal Medicine

## 2020-09-10 DIAGNOSIS — H7291 Unspecified perforation of tympanic membrane, right ear: Secondary | ICD-10-CM | POA: Diagnosis not present

## 2020-09-10 DIAGNOSIS — H6983 Other specified disorders of Eustachian tube, bilateral: Secondary | ICD-10-CM | POA: Diagnosis not present

## 2020-09-10 DIAGNOSIS — H906 Mixed conductive and sensorineural hearing loss, bilateral: Secondary | ICD-10-CM | POA: Diagnosis not present

## 2020-09-10 NOTE — Progress Notes (Signed)
Remote pacemaker transmission.   

## 2020-09-18 ENCOUNTER — Other Ambulatory Visit: Payer: Self-pay

## 2020-09-18 ENCOUNTER — Ambulatory Visit (INDEPENDENT_AMBULATORY_CARE_PROVIDER_SITE_OTHER): Payer: Medicare Other

## 2020-09-18 DIAGNOSIS — Z5181 Encounter for therapeutic drug level monitoring: Secondary | ICD-10-CM | POA: Diagnosis not present

## 2020-09-18 DIAGNOSIS — I4819 Other persistent atrial fibrillation: Secondary | ICD-10-CM

## 2020-09-18 DIAGNOSIS — I480 Paroxysmal atrial fibrillation: Secondary | ICD-10-CM

## 2020-09-18 LAB — POCT INR: INR: 2.2 (ref 2.0–3.0)

## 2020-09-18 NOTE — Patient Instructions (Signed)
Continue warfarin 1 tablet daily.  INR check 6 weeks Call if scheduled for any procedures or has any new medications  [phone]

## 2020-09-19 DIAGNOSIS — Z1211 Encounter for screening for malignant neoplasm of colon: Secondary | ICD-10-CM | POA: Diagnosis not present

## 2020-09-19 DIAGNOSIS — D509 Iron deficiency anemia, unspecified: Secondary | ICD-10-CM | POA: Diagnosis not present

## 2020-09-20 DIAGNOSIS — R600 Localized edema: Secondary | ICD-10-CM | POA: Diagnosis not present

## 2020-10-03 DIAGNOSIS — Z23 Encounter for immunization: Secondary | ICD-10-CM | POA: Diagnosis not present

## 2020-10-04 ENCOUNTER — Other Ambulatory Visit: Payer: Self-pay

## 2020-10-04 ENCOUNTER — Other Ambulatory Visit: Payer: Medicare Other

## 2020-10-04 DIAGNOSIS — Z950 Presence of cardiac pacemaker: Secondary | ICD-10-CM | POA: Diagnosis not present

## 2020-10-04 DIAGNOSIS — I495 Sick sinus syndrome: Secondary | ICD-10-CM

## 2020-10-04 DIAGNOSIS — I480 Paroxysmal atrial fibrillation: Secondary | ICD-10-CM | POA: Diagnosis not present

## 2020-10-04 DIAGNOSIS — I2510 Atherosclerotic heart disease of native coronary artery without angina pectoris: Secondary | ICD-10-CM

## 2020-10-05 LAB — BASIC METABOLIC PANEL
BUN/Creatinine Ratio: 20 (ref 10–24)
BUN: 17 mg/dL (ref 10–36)
CO2: 21 mmol/L (ref 20–29)
Calcium: 9.2 mg/dL (ref 8.6–10.2)
Chloride: 105 mmol/L (ref 96–106)
Creatinine, Ser: 0.86 mg/dL (ref 0.76–1.27)
Glucose: 100 mg/dL — ABNORMAL HIGH (ref 65–99)
Potassium: 4.4 mmol/L (ref 3.5–5.2)
Sodium: 142 mmol/L (ref 134–144)
eGFR: 82 mL/min/{1.73_m2} (ref >59)

## 2020-10-05 LAB — DIGOXIN LEVEL: Digoxin, Serum: 0.4 ng/mL — ABNORMAL LOW (ref 0.5–0.9)

## 2020-10-30 ENCOUNTER — Ambulatory Visit (INDEPENDENT_AMBULATORY_CARE_PROVIDER_SITE_OTHER): Payer: Medicare Other

## 2020-10-30 ENCOUNTER — Other Ambulatory Visit: Payer: Self-pay

## 2020-10-30 DIAGNOSIS — I4819 Other persistent atrial fibrillation: Secondary | ICD-10-CM | POA: Diagnosis not present

## 2020-10-30 DIAGNOSIS — Z5181 Encounter for therapeutic drug level monitoring: Secondary | ICD-10-CM

## 2020-10-30 DIAGNOSIS — I480 Paroxysmal atrial fibrillation: Secondary | ICD-10-CM

## 2020-10-30 LAB — POCT INR: INR: 2.9 (ref 2.0–3.0)

## 2020-10-30 NOTE — Patient Instructions (Signed)
Continue warfarin 1 tablet daily.  INR check 3 weeks Call if scheduled for any procedures or has any new medications  [phone]

## 2020-11-20 ENCOUNTER — Other Ambulatory Visit: Payer: Self-pay

## 2020-11-20 ENCOUNTER — Ambulatory Visit (INDEPENDENT_AMBULATORY_CARE_PROVIDER_SITE_OTHER): Payer: Medicare Other

## 2020-11-20 DIAGNOSIS — I480 Paroxysmal atrial fibrillation: Secondary | ICD-10-CM

## 2020-11-20 DIAGNOSIS — Z5181 Encounter for therapeutic drug level monitoring: Secondary | ICD-10-CM | POA: Diagnosis not present

## 2020-11-20 DIAGNOSIS — I495 Sick sinus syndrome: Secondary | ICD-10-CM | POA: Diagnosis not present

## 2020-11-20 DIAGNOSIS — I4819 Other persistent atrial fibrillation: Secondary | ICD-10-CM | POA: Diagnosis not present

## 2020-11-20 LAB — CUP PACEART REMOTE DEVICE CHECK
Battery Remaining Longevity: 106 mo
Battery Remaining Percentage: 95.5 %
Battery Voltage: 3.04 V
Brady Statistic AP VP Percent: 0 %
Brady Statistic AP VS Percent: 0 %
Brady Statistic AS VP Percent: 0 %
Brady Statistic AS VS Percent: 0 %
Brady Statistic RA Percent Paced: 1 %
Brady Statistic RV Percent Paced: 12 %
Date Time Interrogation Session: 20220920020019
Implantable Lead Implant Date: 20220321
Implantable Lead Implant Date: 20220321
Implantable Lead Location: 753859
Implantable Lead Location: 753860
Implantable Pulse Generator Implant Date: 20220321
Lead Channel Impedance Value: 430 Ohm
Lead Channel Impedance Value: 550 Ohm
Lead Channel Pacing Threshold Amplitude: 0.75 V
Lead Channel Pacing Threshold Pulse Width: 0.4 ms
Lead Channel Sensing Intrinsic Amplitude: 1.1 mV
Lead Channel Sensing Intrinsic Amplitude: 11.1 mV
Lead Channel Setting Pacing Amplitude: 2.5 V
Lead Channel Setting Pacing Amplitude: 3.5 V
Lead Channel Setting Pacing Pulse Width: 0.4 ms
Lead Channel Setting Sensing Sensitivity: 2 mV
Pulse Gen Model: 2272
Pulse Gen Serial Number: 3909458

## 2020-11-20 LAB — POCT INR: INR: 2.7 (ref 2.0–3.0)

## 2020-11-20 NOTE — Patient Instructions (Signed)
Continue warfarin 1 tablet daily.  INR check 2 weeks Call if scheduled for any procedures or has any new medications  [phone]

## 2020-11-27 NOTE — Progress Notes (Signed)
Remote pacemaker transmission.   

## 2020-12-04 ENCOUNTER — Other Ambulatory Visit: Payer: Self-pay

## 2020-12-04 ENCOUNTER — Ambulatory Visit (INDEPENDENT_AMBULATORY_CARE_PROVIDER_SITE_OTHER): Payer: Medicare Other

## 2020-12-04 DIAGNOSIS — Z5181 Encounter for therapeutic drug level monitoring: Secondary | ICD-10-CM | POA: Diagnosis not present

## 2020-12-04 DIAGNOSIS — I480 Paroxysmal atrial fibrillation: Secondary | ICD-10-CM

## 2020-12-04 LAB — POCT INR: INR: 2.7 (ref 2.0–3.0)

## 2020-12-04 NOTE — Patient Instructions (Signed)
Description   Continue warfarin 1 tablet daily.  INR check 6 weeks Call if scheduled for any procedures or has any new medications  [phone]      

## 2020-12-18 DIAGNOSIS — Z23 Encounter for immunization: Secondary | ICD-10-CM | POA: Diagnosis not present

## 2020-12-25 DIAGNOSIS — M810 Age-related osteoporosis without current pathological fracture: Secondary | ICD-10-CM | POA: Diagnosis not present

## 2020-12-25 DIAGNOSIS — C61 Malignant neoplasm of prostate: Secondary | ICD-10-CM | POA: Diagnosis not present

## 2020-12-25 DIAGNOSIS — R600 Localized edema: Secondary | ICD-10-CM | POA: Diagnosis not present

## 2020-12-25 DIAGNOSIS — Z191 Hormone sensitive malignancy status: Secondary | ICD-10-CM | POA: Diagnosis not present

## 2020-12-25 DIAGNOSIS — R0602 Shortness of breath: Secondary | ICD-10-CM | POA: Diagnosis not present

## 2020-12-27 DIAGNOSIS — Z9622 Myringotomy tube(s) status: Secondary | ICD-10-CM | POA: Diagnosis not present

## 2020-12-27 DIAGNOSIS — Z7901 Long term (current) use of anticoagulants: Secondary | ICD-10-CM | POA: Diagnosis not present

## 2020-12-27 DIAGNOSIS — H9212 Otorrhea, left ear: Secondary | ICD-10-CM | POA: Diagnosis not present

## 2020-12-27 DIAGNOSIS — R040 Epistaxis: Secondary | ICD-10-CM | POA: Diagnosis not present

## 2021-01-09 ENCOUNTER — Telehealth: Payer: Self-pay | Admitting: Interventional Cardiology

## 2021-01-09 NOTE — Telephone Encounter (Signed)
Follow Up:   Wife said the patient  only received 40 tablets of his Warfarin, she says he need #90     *STAT* If patient is at the pharmacy, call can be transferred to refill team.   1. Which medications need to be refilled? (please list name of each medication and dose if known) Warfarin  2. Which pharmacy/location (including street and city if local pharmacy) is medication to be sent to? [facility]  3. Do they need a 30 day or 90 day supply? 90 days and refills

## 2021-01-22 ENCOUNTER — Other Ambulatory Visit: Payer: Self-pay

## 2021-01-22 ENCOUNTER — Ambulatory Visit (INDEPENDENT_AMBULATORY_CARE_PROVIDER_SITE_OTHER): Payer: Medicare Other

## 2021-01-22 DIAGNOSIS — Z5181 Encounter for therapeutic drug level monitoring: Secondary | ICD-10-CM | POA: Diagnosis not present

## 2021-01-22 DIAGNOSIS — I480 Paroxysmal atrial fibrillation: Secondary | ICD-10-CM

## 2021-01-22 LAB — POCT INR: INR: 1.9 — AB (ref 2.0–3.0)

## 2021-01-22 MED ORDER — WARFARIN SODIUM 5 MG PO TABS: ORAL_TABLET | ORAL | 3 refills | Status: DC

## 2021-01-22 NOTE — Patient Instructions (Addendum)
Description   Take 1.5 tablets today AND 1.5 tablets tomorrow and then continue warfarin 1 tablet daily.  INR check 5 weeks Call if scheduled for any procedures or has any new medications  [phone]

## 2021-01-22 NOTE — Telephone Encounter (Signed)
Prescription refill request received for warfarin Lov: 09/06/20 Robert [name])  Next INR check: 02/26/21 Warfarin tablet strength: 5mg   Appropriate dose and refill sent to requested pharmacy.

## 2021-01-28 ENCOUNTER — Ambulatory Visit
Admission: RE | Admit: 2021-01-28 | Discharge: 2021-01-28 | Disposition: A | Discharge status: Home / Self Care | Payer: Medicare Other | Source: Ambulatory Visit | Attending: Internal Medicine | Admitting: Internal Medicine

## 2021-01-28 ENCOUNTER — Other Ambulatory Visit: Payer: Self-pay | Admitting: Internal Medicine

## 2021-01-28 DIAGNOSIS — M5430 Sciatica, unspecified side: Secondary | ICD-10-CM | POA: Diagnosis not present

## 2021-01-28 DIAGNOSIS — D509 Iron deficiency anemia, unspecified: Secondary | ICD-10-CM | POA: Diagnosis not present

## 2021-01-28 DIAGNOSIS — M545 Low back pain, unspecified: Secondary | ICD-10-CM

## 2021-01-28 DIAGNOSIS — R0609 Other forms of dyspnea: Secondary | ICD-10-CM | POA: Diagnosis not present

## 2021-01-28 DIAGNOSIS — M2578 Osteophyte, vertebrae: Secondary | ICD-10-CM | POA: Diagnosis not present

## 2021-01-28 DIAGNOSIS — Z79899 Other long term (current) drug therapy: Secondary | ICD-10-CM | POA: Diagnosis not present

## 2021-01-31 ENCOUNTER — Ambulatory Visit (INDEPENDENT_AMBULATORY_CARE_PROVIDER_SITE_OTHER): Payer: Medicare Other | Admitting: Physician Assistant

## 2021-01-31 ENCOUNTER — Encounter (HOSPITAL_BASED_OUTPATIENT_CLINIC_OR_DEPARTMENT_OTHER): Payer: Self-pay | Admitting: Family

## 2021-01-31 ENCOUNTER — Other Ambulatory Visit: Payer: Self-pay

## 2021-01-31 VITALS — BP 158/82 | HR 78 | Ht 68.0 in | Wt 176.0 lb

## 2021-01-31 DIAGNOSIS — Z950 Presence of cardiac pacemaker: Secondary | ICD-10-CM

## 2021-01-31 DIAGNOSIS — I2510 Atherosclerotic heart disease of native coronary artery without angina pectoris: Secondary | ICD-10-CM | POA: Diagnosis not present

## 2021-01-31 DIAGNOSIS — I10 Essential (primary) hypertension: Secondary | ICD-10-CM | POA: Diagnosis not present

## 2021-01-31 DIAGNOSIS — I5032 Chronic diastolic (congestive) heart failure: Secondary | ICD-10-CM

## 2021-01-31 DIAGNOSIS — E785 Hyperlipidemia, unspecified: Secondary | ICD-10-CM

## 2021-01-31 DIAGNOSIS — I480 Paroxysmal atrial fibrillation: Secondary | ICD-10-CM | POA: Diagnosis not present

## 2021-01-31 NOTE — Progress Notes (Addendum)
Office Visit    Patient Name: Robert [name] Date of Encounter: 01/31/2021  PCP:  Josetta Huddle, MD   [place]  Cardiologist:  Larae Grooms, MD  Advanced Practice Provider:  No care team member to display Electrophysiologist:  None   Chief Complaint    Robert [name] is a 87 y.o. male with a hx of tachy-brady syndrome, sp PPM insertion, aortic stenosis s/p AVR, CAD s/p CABG, GERD, Hx of GI bleed, Hypertension, and Hyperlipidemia presents today for dyspnea on exertion.   Past Medical History    Past Medical History:  Diagnosis Date   Aortic stenosis    a. s/p tissue AVR 05/2013;  b. Echo (06/2013):  Mod LVH, EF 60-65%, no RWMA, Gr 2 DD, AVR ok (mean 13 mmHg), MAC, mild MR, mod LAE, mild RAE, PASP 46 mmHg (mild pulmo HTN)   Blood loss anemia    a. 05/2009 a/w GIB.   CAD (coronary artery disease)    a. Cath 04/2009: BMS to LCx 04/2009; CTO of RCA with L-R collaterals, 40% ostial diag.   Carpal tunnel syndrome    bilateral, carpal tunnel release x 2   Esophageal ulcer    a. 05/2009 with hemorrhage - injected with epinephrine-Dr. Herbie Baltimore Buccini.   Esophagitis    a. 05/2009 a/w GIB.   GERD (gastroesophageal reflux disease)    EGD, Dr. Toney Rakes 2008   GI bleed    a. 05/2009 as above: with associated anemia. Lower esophageal ulcer with extensive erosive esophagitis and large clot by EGD 05/2009.    Hx of colonoscopy 2008   Dr. Toney Rakes, polyps- 5 yr f/u recommended   Hyperlipidemia    Hypertension    PAF (paroxysmal atrial fibrillation) ([place])    a. Post-op AVR;  b. 04/2014 recurrent.   Perforated ear drum    right ear drum, tube in left ear drum, Dr. Constance Holster   Polymyalgia rheumatica [facility])    Prostate cancer [facility])    Vitamin D deficiency    Past Surgical History:  Procedure Laterality Date   AORTIC VALVE REPLACEMENT N/A 05/12/2013   Procedure: AORTIC VALVE REPLACEMENT (AVR);  Surgeon: Ivin Poot, MD;  Location: [place];   Service: Open Heart Surgery;  Laterality: N/A;   CARDIAC CATHETERIZATION  2011   CARDIOVERSION N/A 04/12/2020   Procedure: CARDIOVERSION;  Surgeon: Dorothy Spark, MD;  Location: [place];  Service: Cardiovascular;  Laterality: N/A;   CORONARY ARTERY BYPASS GRAFT N/A 05/12/2013   Procedure: CORONARY ARTERY BYPASS GRAFTING (CABG);  Surgeon: Ivin Poot, MD;  Location: [employer];  Service: Open Heart Surgery;  Laterality: N/A;  CABG x 1 using left leg greater saphenous vein harvested endoscopically   INTRAOPERATIVE TRANSESOPHAGEAL ECHOCARDIOGRAM N/A 05/12/2013   Procedure: INTRAOPERATIVE TRANSESOPHAGEAL ECHOCARDIOGRAM;  Surgeon: Ivin Poot, MD;  Location: [place];  Service: Open Heart Surgery;  Laterality: N/A;   LEFT AND RIGHT HEART CATHETERIZATION WITH CORONARY ANGIOGRAM N/A 05/09/2013   Procedure: LEFT AND RIGHT HEART CATHETERIZATION WITH CORONARY ANGIOGRAM;  Surgeon: Lorretta Harp, MD;  Location: [facility] CATH LAB;  Service: Cardiovascular;  Laterality: N/A;   MAZE N/A 05/12/2013   Procedure: MAZE;  Surgeon: Ivin Poot, MD;  Location: [place];  Service: Open Heart Surgery;  Laterality: N/A;   PACEMAKER IMPLANT N/A 05/21/2020   Procedure: PACEMAKER IMPLANT;  Surgeon: Evans Lance, MD;  Location: [place] CV LAB;  Service: Cardiovascular;  Laterality: N/A;   PROSTATECTOMY     TEE  WITHOUT CARDIOVERSION N/A 04/12/2020   Procedure: TRANSESOPHAGEAL ECHOCARDIOGRAM (TEE);  Surgeon: Dorothy Spark, MD;  Location: [facility] ENDOSCOPY;  Service: Cardiovascular;  Laterality: N/A;    Allergies  Allergies  Allergen Reactions   Lisinopril Cough   Amoxicillin Swelling    penile swelling   Lasix [Furosemide] Other (See Comments)    Unsure of reaction   Kenalog [Triamcinolone] Rash    History of Present Illness    Robert [name] is a 87 y.o. male with a hx of hx of tachy-brady syndrome, sp PPM insertion, aortic stenosis, CAD, GERD, Hx of GI bleed, Hypertension, and Hyperlipidemia  presents today for  dyspnea on exertion last seen 09/06/2020 by Dr. Lovena Le.  Patient has a h/o tachy-brady syndrome, s/p PPM insertion. He has a remote h/o atrial fibrillation and also periods of very slow VR with heart rates in the 30s. He underwent DCCV and had ERAF. He denies palpitations. He had dyspnea with exertion. He is taking daily lasix. He denies edema.   When he was seen by Dr. Lovena Le in July of 2022, his atrial fibrillation rates were not well controlled and digoxin was started. He was to continue daily lasix for fluid overload. His PPM was programmed VVIR. His BP was fairly well controlled at this time.   Today, he endorses worsening shortness of breath that is only exertional. He states that he has had SOB for over a year but the last few months it has limited activity. His weight has been stable per the patient and he does not have any lower extremity edema (per the patient). He does not have any trouble with shortness of breath laying flat and he doesn't get SOB in the middle of the night. He has not had any chest pain and he did have it back in 2015 before he had his CABG/AVR. He feels like his SOB has limited his activity level. He was 40 mg of Lasix daily recently decreased to 20 daily because he presents urinating too much.  On exam, he does 2+ pitting edema to bilateral, lungs are clear.  He is not a smoker and has never smoked.  He did have COVID-19 about 2 years ago but it was a mild case with no residual breathing side effects.  He does have a permanent pacemaker implanted and his EKG today shows rate controlled atrial fibrillation.  Last echocardiogram was done February 2022 which showed LVEF of 50 to 55% and no significant valvular disease.  Prosthetic AVR was visualized without any issue.  He remains on chronic anticoagulation with Coumadin for his atrial fibrillation and his most recent hemoglobin was 10.  Reports no chest pain, pressure, or tightness. No edema, orthopnea, PND.  Reports no palpitations.      EKGs/Labs/Other Studies Reviewed:   The following studies were reviewed today:  04/12/2020 Echocardiogram IMPRESSIONS     1. The patient doesn't have a residual left atrial appendage post  clipping during his CABG/AVR/maze surgery in 2015.   2. Left ventricular ejection fraction, by estimation, is 50 to 55%. The  left ventricle has low normal function. The left ventricle has no regional  wall motion abnormalities. Left ventricular diastolic function could not  be evaluated.   3. Right ventricular systolic function is normal. The right ventricular  size is normal.   4. Left atrial appendage is clipped. Left atrial size was severely  dilated. No left atrial/left atrial appendage thrombus was detected.   5. Right atrial size was moderately dilated.  6. The mitral valve is normal in structure. Moderate mitral valve  regurgitation. No evidence of mitral stenosis.   7. Tricuspid valve regurgitation is moderate.   8. The aortic valve has been repaired/replaced. Aortic valve  regurgitation is not visualized. No aortic stenosis is present. There is a  23 mm Magna Ease pericardial valve valve present in the aortic position.  Procedure Date: 2015. Echo findings are  consistent with normal structure and function of the aortic valve  prosthesis. Aortic valve mean gradient measures .0 mmHg.   9. The inferior vena cava is normal in size with greater than 50%  respiratory variability, suggesting right atrial pressure of 3 mmHg.   Conclusion(s)/Recommendation(s): No LA/LAA thrombus identified. Successful  cardioversion performed with restoration of normal sinus rhythm.   FINDINGS   Left Ventricle: Left ventricular ejection fraction, by estimation, is 50  to 55%. The left ventricle has low normal function. The left ventricle has  no regional wall motion abnormalities. The left ventricular internal  cavity size was normal in size.  There is no left ventricular  hypertrophy. Left ventricular diastolic  function could not be evaluated.   Right Ventricle: The right ventricular size is normal. No increase in  right ventricular wall thickness. Right ventricular systolic function is  normal.   Left Atrium: Left atrial appendage is clipped. Left atrial size was  severely dilated. No left atrial/left atrial appendage thrombus was  detected.   Right Atrium: Right atrial size was moderately dilated. Prominent  Eustachian valve.   Pericardium: There is no evidence of pericardial effusion.   Mitral Valve: The mitral valve is normal in structure. Moderate mitral  valve regurgitation. No evidence of mitral valve stenosis.   Tricuspid Valve: The tricuspid valve is normal in structure. Tricuspid  valve regurgitation is moderate . No evidence of tricuspid stenosis.   Aortic Valve: The aortic valve has been repaired/replaced. Aortic valve  regurgitation is not visualized. No aortic stenosis is present. Aortic  valve mean gradient measures 8.0 mmHg. Aortic valve peak gradient measures  16.3 mmHg. There is a 23 mm Magna  Ease pericardial valve valve present in the aortic position. Procedure  Date: 2015. Echo findings are consistent with normal structure and  function of the aortic valve prosthesis.   Pulmonic Valve: The pulmonic valve was normal in structure. Pulmonic valve  regurgitation is not visualized. No evidence of pulmonic stenosis.   Aorta: The aortic root is normal in size and structure.   Venous: The inferior vena cava is normal in size with greater than 50%  respiratory variability, suggesting right atrial pressure of 3 mmHg.   IAS/Shunts: No atrial level shunt detected by color flow Doppler. There is no evidence of an atrial septal defect.   EKG:  EKG is  ordered today.  The ekg ordered today demonstrates rate controlled atrial fibrillation, rate 78 bpm  Recent Labs: 04/24/2020: NT-Pro BNP 2,414; TSH 2.180 05/03/2020: Hemoglobin 10.4;  Platelets 175 10/04/2020: BUN 17; Creatinine, Ser 0.86; Potassium 4.4; Sodium 142  Recent Lipid Panel    Component Value Date/Time   CHOL 111 04/24/2020 0917   TRIG 87 04/24/2020 0917   HDL 29 (L) 04/24/2020 0917   CHOLHDL 3.8 04/24/2020 0917   LDLCALC 65 04/24/2020 0917    Risk Assessment/Calculations:   CHA2DS2-VASc Score = 4   This indicates a 4.8% annual risk of stroke. The patient's score is based upon: CHF History: 1 HTN History: 1 Diabetes History: 0 Stroke History: 0 Vascular Disease History: 0 Age  Score: 2 Gender Score: 0     Home Medications   Current Meds  Medication Sig   acetaminophen (TYLENOL) 500 MG tablet Take 500-1,000 mg by mouth every 6 (six) hours as needed (pain).   cholecalciferol (VITAMIN D) 1000 UNITS tablet Take 1,000 Units by mouth 3 (three) times a week.   digoxin (LANOXIN) 0.125 MG tablet Take 1 tablet (0.125 mg total) by mouth daily.   losartan (COZAAR) 50 MG tablet TAKE 1 TABLET DAILY   metoprolol succinate (TOPROL-XL) 50 MG 24 hr tablet Take 1 tablet (50 mg total) by mouth in the morning and at bedtime.   omeprazole (PRILOSEC) 20 MG capsule Take 20 mg by mouth every morning.   Potassium 99 MG TABS Take 99 mg by mouth 2 (two) times a week.   rosuvastatin (CRESTOR) 20 MG tablet Take 1 tablet (20 mg total) by mouth daily.   vitamin C (ASCORBIC ACID) 500 MG tablet Take 500 mg by mouth 3 (three) times a week.   warfarin (COUMADIN) 5 MG tablet TAKE 1 -2 TABLETS DAILY AS DIRECTED BY THE COUMADIN CLINIC     Review of Systems      All other systems reviewed and are otherwise negative except as noted above.  Physical Exam    VS:  BP (!) 158/82   Pulse 78   Ht _0  (1.727 m)   Wt 176 lb (79.8 kg)   BMI 26.76 kg/m  , BMI Body mass index is 26.76 kg/m.  Wt Readings from Last 3 Encounters:  01/31/21 176 lb (79.8 kg)  09/06/20 177 lb (80.3 kg)  05/21/20 170 lb (77.1 kg)     GEN: Well nourished, well developed, in no acute distress. HEENT:  normal. Neck: Supple, no JVD, carotid bruits, or masses. Cardiac: RRR, no murmurs, rubs, or gallops. No clubbing, cyanosis, edema.  Radials/PT 2+ and equal bilaterally.  Respiratory:  Respirations regular and unlabored, clear to auscultation bilaterally. GI: Soft, nontender, nondistended. MS: No deformity or atrophy. Skin: Warm and dry, no rash. Neuro:  Strength and sensation are intact. Psych: Normal affect.  Assessment & Plan    Status post CABG, AVR, maze, and left atrial appendage clip in 2015, Pre stent  -Last echocardiogram 04/2020 was stable. -No chest pain  Hypertension -Continue Cozaar and Toprol-XL -Today in the clinic it was 158/82  -Home readings are in the 371-062I systolic range -Continue low-sodium, heart healthy diet  Hyperlipidemia -continue Crestor -Recent lipid panel 07/2020 showed triglycerides 145, LDL 56, HDL 30, total cholesterol 112 -TSH 2.79  Atrial fib on chronic anticoagulation -remains on Coumadin, recent INR 1.9 -Hemoglobin done 05/2020 and was 10.4 -No bleeding or bruising -We will order a follow-up CBC at this appointment   Tachy/Brady Syndrome -Follows with Dr. Lovena Le.  Recent PPM placement. -No palpitations or fluttering in the chest  Chronic diastolic heart failure -9-4+ pitting bilateral pedal edema on exam -Lung sounds are clear -He remains on furosemide 20 mg daily with potassium supplementation. We will increase to 72m daily -Daily weights have been stable -We will get a repeat BMP and BNP.   Disposition: Follow up in 1 month(s) with JLarae Grooms MD or APP.  Signed, TElgie Collard PA-C 01/31/2021, 3:31 PM [place]

## 2021-01-31 NOTE — Patient Instructions (Signed)
Medication Instructions:  Your physician has recommended you make the following change in your medication:   RESUME Furosemide (Lasix) 40mg  daily    *If you need a refill on your cardiac medications before your next appointment, please call your pharmacy*   Lab Work: Your physician recommends that you return for lab work today for CBC, BMP, BNP  If you have labs (blood work) drawn today and your tests are completely normal, you will receive your results only by: MyChart Message (if you have MyChart) OR A paper copy in the mail If you have any lab test that is abnormal or we need to change your treatment, we will call you to review the results.   Testing/Procedures: Your EKG today showed rate controlled atrial fibrillation.   Your physician has requested that you have an echocardiogram. Echocardiography is a painless test that uses sound waves to create images of your heart. It provides your doctor with information about the size and shape of your heart and how well your heart's chambers and valves are working. This procedure takes approximately one hour. There are no restrictions for this procedure.   Follow-Up: At [facility], you and your health needs are our priority.  As part of our continuing mission to provide you with exceptional heart care, we have created designated Provider Care Teams.  These Care Teams include your primary Cardiologist (physician) and Advanced Practice Providers (APPs -  Physician Assistants and Nurse Practitioners) who all work together to provide you with the care you need, when you need it.  We recommend signing up for the patient portal called "MyChart".  Sign up information is provided on this After Visit Summary.  MyChart is used to connect with patients for Virtual Visits (Telemedicine).  Patients are able to view lab/test results, encounter notes, upcoming appointments, etc.  Non-urgent messages can be sent to your provider as well.   To learn more  about what you can do with MyChart, go to [email or web address].    Your next appointment:   1 month(s)  The format for your next appointment:   In Person  Provider:   Larae Grooms, MD or Advanced Practice Provider    Other Instructions  Recommend weighing daily and keeping a log. Please call our office if you have weight gain of 2 pounds overnight or 5 pounds in 1 week.   Date  Time Weight                                             Heart Healthy Diet Recommendations: A low-salt diet is recommended. Meats should be grilled, baked, or boiled. Avoid fried foods. Focus on lean protein sources like fish or chicken with vegetables and fruits. The American Heart Association is a Microbiologist!  American Heart Association Diet and Lifeystyle Recommendations    Exercise recommendations: The American Heart Association recommends 150 minutes of moderate intensity exercise weekly. Try 30 minutes of moderate intensity exercise 4-5 times per week. This could include walking, jogging, or swimming.

## 2021-02-01 LAB — BASIC METABOLIC PANEL
BUN/Creatinine Ratio: 22 (ref 10–24)
BUN: 18 mg/dL (ref 10–36)
CO2: 26 mmol/L (ref 20–29)
Calcium: 9.3 mg/dL (ref 8.6–10.2)
Chloride: 104 mmol/L (ref 96–106)
Creatinine, Ser: 0.83 mg/dL (ref 0.76–1.27)
Glucose: 97 mg/dL (ref 70–99)
Potassium: 4.8 mmol/L (ref 3.5–5.2)
Sodium: 141 mmol/L (ref 134–144)
eGFR: 83 mL/min/{1.73_m2} (ref >59)

## 2021-02-01 LAB — CBC
Hematocrit: 33.1 % — ABNORMAL LOW (ref 37.5–51.0)
Hemoglobin: 10.9 g/dL — ABNORMAL LOW (ref 13.0–17.7)
MCH: 32.4 pg (ref 26.6–33.0)
MCHC: 32.9 g/dL (ref 31.5–35.7)
MCV: 99 fL — ABNORMAL HIGH (ref 79–97)
Platelets: 148 10*3/uL — ABNORMAL LOW (ref 150–450)
RBC: 3.36 x10E6/uL — ABNORMAL LOW (ref 4.14–5.80)
RDW: 13.8 % (ref 11.6–15.4)
WBC: 11.2 10*3/uL — ABNORMAL HIGH (ref 3.4–10.8)

## 2021-02-01 LAB — BRAIN NATRIURETIC PEPTIDE: BNP: 458.3 pg/mL — ABNORMAL HIGH (ref 0.0–100.0)

## 2021-02-04 NOTE — Progress Notes (Signed)
Called pt to review most recent labs results. No answer. OK to leave details on VM per DPR. Instructed pt to call back if he has questions..   Labs ordered and slips put in mail!

## 2021-02-04 NOTE — Addendum Note (Signed)
Addended by: Gerald Stabs on: 02/04/2021 02:18 PM   Modules accepted: Orders

## 2021-02-07 DIAGNOSIS — M48062 Spinal stenosis, lumbar region with neurogenic claudication: Secondary | ICD-10-CM | POA: Diagnosis not present

## 2021-02-07 DIAGNOSIS — M5451 Vertebrogenic low back pain: Secondary | ICD-10-CM | POA: Diagnosis not present

## 2021-02-07 DIAGNOSIS — M5136 Other intervertebral disc degeneration, lumbar region: Secondary | ICD-10-CM | POA: Diagnosis not present

## 2021-02-11 ENCOUNTER — Telehealth: Payer: Self-pay | Admitting: Physician Assistant

## 2021-02-11 NOTE — Telephone Encounter (Signed)
Returned call to pts. Daughter Robert [name] (ok per [facility]) and explained that the pts. Initial labs showed fluid overload and his fluid pill had been increased at that visit. Informed pts. Daughter that repeat labs are needed to check fluid status and see if medication adjustments are needed. Rodena Piety states she will call her father to explain and will call us back if he does not plan on getting these labs done. Informed pts. Daughter that he can come to the [facility] location, [place] or any [employer] location, whichever is easiest!

## 2021-02-11 NOTE — Telephone Encounter (Signed)
New Message:    Patient's daughter is calling. She says patient wants to know why he received a letter stating that he needs lab work and he just had lab work on 01-31-21?

## 2021-02-12 ENCOUNTER — Other Ambulatory Visit: Payer: Medicare Other

## 2021-02-19 ENCOUNTER — Ambulatory Visit (INDEPENDENT_AMBULATORY_CARE_PROVIDER_SITE_OTHER): Payer: Medicare Other

## 2021-02-19 DIAGNOSIS — I495 Sick sinus syndrome: Secondary | ICD-10-CM

## 2021-02-19 LAB — CUP PACEART REMOTE DEVICE CHECK
Battery Remaining Longevity: 103 mo
Battery Remaining Percentage: 95.5 %
Battery Voltage: 3.02 V
Brady Statistic AP VP Percent: 0 %
Brady Statistic AP VS Percent: 0 %
Brady Statistic AS VP Percent: 0 %
Brady Statistic AS VS Percent: 0 %
Brady Statistic RA Percent Paced: 1 %
Brady Statistic RV Percent Paced: 18 %
Date Time Interrogation Session: 20221220034735
Implantable Lead Implant Date: 20220321
Implantable Lead Implant Date: 20220321
Implantable Lead Location: 753859
Implantable Lead Location: 753860
Implantable Pulse Generator Implant Date: 20220321
Lead Channel Impedance Value: 410 Ohm
Lead Channel Impedance Value: 510 Ohm
Lead Channel Pacing Threshold Amplitude: 0.75 V
Lead Channel Pacing Threshold Pulse Width: 0.4 ms
Lead Channel Sensing Intrinsic Amplitude: 1.1 mV
Lead Channel Sensing Intrinsic Amplitude: 10.6 mV
Lead Channel Setting Pacing Amplitude: 2.5 V
Lead Channel Setting Pacing Amplitude: 3.5 V
Lead Channel Setting Pacing Pulse Width: 0.4 ms
Lead Channel Setting Sensing Sensitivity: 2 mV
Pulse Gen Model: 2272
Pulse Gen Serial Number: 3909458

## 2021-02-22 ENCOUNTER — Other Ambulatory Visit: Payer: Medicare Other

## 2021-02-26 ENCOUNTER — Other Ambulatory Visit: Payer: Self-pay

## 2021-02-26 ENCOUNTER — Ambulatory Visit (INDEPENDENT_AMBULATORY_CARE_PROVIDER_SITE_OTHER): Payer: Medicare Other

## 2021-02-26 DIAGNOSIS — I480 Paroxysmal atrial fibrillation: Secondary | ICD-10-CM | POA: Diagnosis not present

## 2021-02-26 DIAGNOSIS — I4819 Other persistent atrial fibrillation: Secondary | ICD-10-CM | POA: Diagnosis not present

## 2021-02-26 DIAGNOSIS — Z5181 Encounter for therapeutic drug level monitoring: Secondary | ICD-10-CM

## 2021-02-26 LAB — POCT INR: INR: 3.2 — AB (ref 2.0–3.0)

## 2021-02-26 NOTE — Patient Instructions (Addendum)
Description   Eat greens today and Continue warfarin 1 tablet daily.  INR check 6 weeks Call if scheduled for any procedures or has any new medications  [phone]

## 2021-02-28 DIAGNOSIS — J00 Acute nasopharyngitis [common cold]: Secondary | ICD-10-CM | POA: Diagnosis not present

## 2021-02-28 NOTE — Progress Notes (Signed)
Remote pacemaker transmission.   

## 2021-03-11 ENCOUNTER — Other Ambulatory Visit: Payer: Medicare Other

## 2021-03-14 DIAGNOSIS — J019 Acute sinusitis, unspecified: Secondary | ICD-10-CM | POA: Diagnosis not present

## 2021-03-17 NOTE — Progress Notes (Signed)
Cardiology Office Note   Date:  03/19/2021   ID:  Robert [name], DOB [birth year], MRN [id number]  PCP:  Josetta Huddle, MD    Chief Complaint  Patient presents with   Follow-up   CAD/AFib  Wt Readings from Last 3 Encounters:  03/19/21 175 lb (79.4 kg)  01/31/21 176 lb (79.8 kg)  09/06/20 177 lb (80.3 kg)       History of Present Illness: Robert [name] is a 88 y.o. male  who had a BMS to the circumflex in 2011.  He had AFib in 2/15.  Workup revealed severe AS.  He had AVR with single vessel CABG, Maze procedure and left atrial clip in 2015. Later in 2015, He had presyncope and his HR was in the 40s.  Resolved with stopping beta blocker and digoxin.  He did well for a while.    In 2/16, he had palpitations and went to the ER. He was in atrial flutter 2:1 block.  He was sent home.  He was started on metoprolol and Eliquis in the clinic the next day.  Rhythm converted.     Eliquis was changed to COumadin due to cost reasons.   Chronic left ankle swelling which progressed to the whole left leg in 2017, vein removed for CABG. It  Was caused by lymph node blocking vein flow. Initally diagnosed with PrCA [phone]. Recent check showed PSA was elevated and has reduced with Lupron.  Swelling is nearly completely resolved after Lupron.  He had a w/u for DVT which was negative.  Following with Dr. Rosana Hoes for his prostate cancer.  Undetectable PSA in 12/18.   Occasional leg edema.   Left leg cellulitis in 8/19, that has been persistent.  U/s showed no DVT. Finished antibiotic course.    He had Covid in January 2021.  He received an antibody infusion.  He recovered very well and had minimal symptoms.  He got fully vacinated.   Chronic foot pain prevents exercise.  He feels like his joint pain gets worse when he takes his statin regularly.  He has had some improvement when he holds his statin.  He did stop taking his statin for a while and his LDL increased to 156 back in early  February 2021.   He has had AFib with RVR.  DCCV was successful but he went back to AFib after 2 days.  He has had some anemia as well.  Hbg 10 -10.5.   Some DOE, notes that his HR goes to the 120-130 range back in 2022.    He tripped once in the house.  HR has been better.    He had some SHOB in late 2022.  Echo was ordered.   He has not had this.  Got some steroids for back pain.  Had some relief, but pain has returned.  BNP down to 458 in 01/2021. (Had been > 2000 in 04/2020). BNP /Pacemaker findings led to daily Lasix daily.  K and Cr were well controlled in 01/2021.   Still feels SHOB. Had laryngitis at Christmas 2022.  Took amoxicilllin with no relief.  Then given doxycycline.    Denies : Chest pain. Dizziness. Leg edema. Nitroglycerin use. Orthopnea. Palpitations. Paroxysmal nocturnal dyspnea. Syncope.    Taking a while to recover from the cold; he is hoping it will be soon.    Past Medical History:  Diagnosis Date   Aortic stenosis    a. s/p tissue AVR 05/2013;  b. Echo (06/2013):  Mod LVH, EF 60-65%, no RWMA, Gr 2 DD, AVR ok (mean 13 mmHg), MAC, mild MR, mod LAE, mild RAE, PASP 46 mmHg (mild pulmo HTN)   Blood loss anemia    a. 05/2009 a/w GIB.   CAD (coronary artery disease)    a. Cath 04/2009: BMS to LCx 04/2009; CTO of RCA with L-R collaterals, 40% ostial diag.   Carpal tunnel syndrome    bilateral, carpal tunnel release x 2   Esophageal ulcer    a. 05/2009 with hemorrhage - injected with epinephrine-Dr. Herbie Baltimore Buccini.   Esophagitis    a. 05/2009 a/w GIB.   GERD (gastroesophageal reflux disease)    EGD, Dr. Toney Rakes 2008   GI bleed    a. 05/2009 as above: with associated anemia. Lower esophageal ulcer with extensive erosive esophagitis and large clot by EGD 05/2009.    Hx of colonoscopy 2008   Dr. Toney Rakes, polyps- 5 yr f/u recommended   Hyperlipidemia    Hypertension    PAF (paroxysmal atrial fibrillation) ([place])    a. Post-op AVR;  b. 04/2014 recurrent.    Perforated ear drum    right ear drum, tube in left ear drum, Dr. Constance Holster   Polymyalgia rheumatica [facility]    Prostate cancer [facility])    Vitamin D deficiency     Past Surgical History:  Procedure Laterality Date   AORTIC VALVE REPLACEMENT N/A 05/12/2013   Procedure: AORTIC VALVE REPLACEMENT (AVR);  Surgeon: Ivin Poot, MD;  Location: [place];  Service: Open Heart Surgery;  Laterality: N/A;   CARDIAC CATHETERIZATION  2011   CARDIOVERSION N/A 04/12/2020   Procedure: CARDIOVERSION;  Surgeon: Dorothy Spark, MD;  Location: [place];  Service: Cardiovascular;  Laterality: N/A;   CORONARY ARTERY BYPASS GRAFT N/A 05/12/2013   Procedure: CORONARY ARTERY BYPASS GRAFTING (CABG);  Surgeon: Ivin Poot, MD;  Location: [place];  Service: Open Heart Surgery;  Laterality: N/A;  CABG x 1 using left leg greater saphenous vein harvested endoscopically   INTRAOPERATIVE TRANSESOPHAGEAL ECHOCARDIOGRAM N/A 05/12/2013   Procedure: INTRAOPERATIVE TRANSESOPHAGEAL ECHOCARDIOGRAM;  Surgeon: Ivin Poot, MD;  Location: [place];  Service: Open Heart Surgery;  Laterality: N/A;   LEFT AND RIGHT HEART CATHETERIZATION WITH CORONARY ANGIOGRAM N/A 05/09/2013   Procedure: LEFT AND RIGHT HEART CATHETERIZATION WITH CORONARY ANGIOGRAM;  Surgeon: Lorretta Harp, MD;  Location: [facility] CATH LAB;  Service: Cardiovascular;  Laterality: N/A;   MAZE N/A 05/12/2013   Procedure: MAZE;  Surgeon: Ivin Poot, MD;  Location: [place];  Service: Open Heart Surgery;  Laterality: N/A;   PACEMAKER IMPLANT N/A 05/21/2020   Procedure: PACEMAKER IMPLANT;  Surgeon: Evans Lance, MD;  Location: [place] CV LAB;  Service: Cardiovascular;  Laterality: N/A;   PROSTATECTOMY     TEE WITHOUT CARDIOVERSION N/A 04/12/2020   Procedure: TRANSESOPHAGEAL ECHOCARDIOGRAM (TEE);  Surgeon: Dorothy Spark, MD;  Location: [facility] ENDOSCOPY;  Service: Cardiovascular;  Laterality: N/A;     Current Outpatient Medications  Medication Sig Dispense Refill    acetaminophen (TYLENOL) 500 MG tablet Take 500-1,000 mg by mouth every 6 (six) hours as needed (pain).     cholecalciferol (VITAMIN D) 1000 UNITS tablet Take 1,000 Units by mouth 3 (three) times a week.     digoxin (LANOXIN) 0.125 MG tablet Take 1 tablet (0.125 mg total) by mouth daily. 90 tablet 3   furosemide (LASIX) 40 MG tablet Take 1 tablet (40 mg total) by mouth daily. 90 tablet 3   losartan (COZAAR) 50  MG tablet TAKE 1 TABLET DAILY 90 tablet 3   metoprolol succinate (TOPROL-XL) 50 MG 24 hr tablet Take 1 tablet (50 mg total) by mouth in the morning and at bedtime. 180 tablet 3   omeprazole (PRILOSEC) 20 MG capsule Take 20 mg by mouth every morning.     Potassium 99 MG TABS Take 99 mg by mouth 2 (two) times a week.     rosuvastatin (CRESTOR) 20 MG tablet Take 1 tablet (20 mg total) by mouth daily. 90 tablet 3   vitamin C (ASCORBIC ACID) 500 MG tablet Take 500 mg by mouth 3 (three) times a week.     warfarin (COUMADIN) 5 MG tablet TAKE 1 -2 TABLETS DAILY AS DIRECTED BY THE COUMADIN CLINIC 115 tablet 3   No current facility-administered medications for this visit.    Allergies:   Amoxicillin, Lisinopril, Kenalog [triamcinolone], and Lasix [furosemide]    Social History:  The patient  reports that he has never smoked. He has never used smokeless tobacco. He reports that he does not drink alcohol and does not use drugs.   Family History:  The patient's family history includes Breast cancer in his daughter; CVA in his mother; Kidney disease in his father.    ROS:  Please see the history of present illness.   Otherwise, review of systems are positive for DOE.   All other systems are reviewed and negative.    PHYSICAL EXAM: VS:  BP 136/86    Pulse 92    Ht _0  (1.727 m)    Wt 175 lb (79.4 kg)    SpO2 96%    BMI 26.61 kg/m  , BMI Body mass index is 26.61 kg/m. GEN: Well nourished, well developed, in no acute distress HEENT: normal Neck: no JVD, carotid bruits, or masses Cardiac: RRR;  no murmurs, rubs, or gallops,; tr LE edema  Respiratory:  clear to auscultation bilaterally, normal work of breathing GI: soft, nontender, nondistended, + BS MS: no deformity or atrophy Skin: warm and dry, no rash Neuro:  Strength and sensation are intact Psych: euthymic mood, full affect   EKG:   The ekg ordered today demonstrates AFib, rate controlled   Recent Labs: 04/24/2020: NT-Pro BNP 2,414; TSH 2.180 01/31/2021: BNP 458.3; BUN 18; Creatinine, Ser 0.83; Hemoglobin 10.9; Platelets 148; Potassium 4.8; Sodium 141   Lipid Panel    Component Value Date/Time   CHOL 111 04/24/2020 0917   TRIG 87 04/24/2020 0917   HDL 29 (L) 04/24/2020 0917   CHOLHDL 3.8 04/24/2020 0917   LDLCALC 65 04/24/2020 0917     Other studies Reviewed: Additional studies/ records that were reviewed today with results demonstrating: 04/2020 TEE.   ASSESSMENT AND PLAN:  AFib: HR controlled. Some DOE.  More activity with a stationary bike, or chair aerobics or walking.  Had  a cold in December.  BNP improved.  COntinue Lasix.  Try to increase stamina with more activity. S/p AVR: functioning well in 04/2020.  Chronic diastolic heart failure.  BNP significantly decreased. Chronic anticoagulation/acquired thrombophilia in the setting of atrial fibrillation: Warfarin for stroke. Hbg stable in 01/2021.  HTN: The current medical regimen is effective;  continue present plan and medications. Hyperlipidemia: LDL 56. Continue lipid lowering therapy.  Pacemaker: VVIR pacer, followed by Dr. Lovena Le.   Current medicines are reviewed at length with the patient today.  The patient concerns regarding his medicines were addressed.  The following changes have been made:  No change  Labs/ tests ordered today  include:  No orders of the defined types were placed in this encounter.   Recommend 150 minutes/week of aerobic exercise Low fat, low carb, high fiber diet recommended  Disposition:   FU in March as scheduled.     SignedLarae Grooms, MD  03/19/2021 11:20 AM    [facility] [address], [place], [place]  [address] Phone: [phone]; Fax: [phone]

## 2021-03-19 ENCOUNTER — Ambulatory Visit (INDEPENDENT_AMBULATORY_CARE_PROVIDER_SITE_OTHER): Payer: Medicare Other | Admitting: Interventional Cardiology

## 2021-03-19 ENCOUNTER — Other Ambulatory Visit: Payer: Self-pay

## 2021-03-19 ENCOUNTER — Encounter: Payer: Self-pay | Admitting: Interventional Cardiology

## 2021-03-19 VITALS — BP 136/86 | HR 92 | Ht 68.0 in | Wt 175.0 lb

## 2021-03-19 DIAGNOSIS — I5032 Chronic diastolic (congestive) heart failure: Secondary | ICD-10-CM | POA: Diagnosis not present

## 2021-03-19 DIAGNOSIS — Z952 Presence of prosthetic heart valve: Secondary | ICD-10-CM | POA: Diagnosis not present

## 2021-03-19 DIAGNOSIS — I10 Essential (primary) hypertension: Secondary | ICD-10-CM

## 2021-03-19 DIAGNOSIS — E785 Hyperlipidemia, unspecified: Secondary | ICD-10-CM | POA: Diagnosis not present

## 2021-03-19 DIAGNOSIS — I4819 Other persistent atrial fibrillation: Secondary | ICD-10-CM | POA: Diagnosis not present

## 2021-03-19 DIAGNOSIS — I2510 Atherosclerotic heart disease of native coronary artery without angina pectoris: Secondary | ICD-10-CM | POA: Diagnosis not present

## 2021-03-19 DIAGNOSIS — Z950 Presence of cardiac pacemaker: Secondary | ICD-10-CM | POA: Diagnosis not present

## 2021-03-19 NOTE — Patient Instructions (Signed)
Medication Instructions:  Your physician recommends that you continue on your current medications as directed. Please refer to the Current Medication list given to you today.  *If you need a refill on your cardiac medications before your next appointment, please call your pharmacy*   Lab Work: none If you have labs (blood work) drawn today and your tests are completely normal, you will receive your results only by: [place] (if you have MyChart) OR A paper copy in the mail If you have any lab test that is abnormal or we need to change your treatment, we will call you to review the results.   Testing/Procedures: none   Follow-Up: At [facility], you and your health needs are our priority.  As part of our continuing mission to provide you with exceptional heart care, we have created designated Provider Care Teams.  These Care Teams include your primary Cardiologist (physician) and Advanced Practice Providers (APPs -  Physician Assistants and Nurse Practitioners) who all work together to provide you with the care you need, when you need it.  We recommend signing up for the patient portal called "MyChart".  Sign up information is provided on this After Visit Summary.  MyChart is used to connect with patients for Virtual Visits (Telemedicine).  Patients are able to view lab/test results, encounter notes, upcoming appointments, etc.  Non-urgent messages can be sent to your provider as well.   To learn more about what you can do with MyChart, go to [email or web address].    Your next appointment:   May 17, 2021  The format for your next appointment:   In Person  Provider:   Larae Grooms, MD     Other Instructions

## 2021-03-25 DIAGNOSIS — G9331 Postviral fatigue syndrome: Secondary | ICD-10-CM | POA: Diagnosis not present

## 2021-03-25 DIAGNOSIS — R0981 Nasal congestion: Secondary | ICD-10-CM | POA: Diagnosis not present

## 2021-03-30 DIAGNOSIS — J111 Influenza due to unidentified influenza virus with other respiratory manifestations: Secondary | ICD-10-CM | POA: Diagnosis not present

## 2021-04-01 DIAGNOSIS — J101 Influenza due to other identified influenza virus with other respiratory manifestations: Secondary | ICD-10-CM | POA: Diagnosis not present

## 2021-04-01 DIAGNOSIS — J189 Pneumonia, unspecified organism: Secondary | ICD-10-CM | POA: Diagnosis not present

## 2021-04-09 ENCOUNTER — Ambulatory Visit (INDEPENDENT_AMBULATORY_CARE_PROVIDER_SITE_OTHER): Payer: Medicare Other

## 2021-04-09 ENCOUNTER — Other Ambulatory Visit: Payer: Self-pay

## 2021-04-09 DIAGNOSIS — I480 Paroxysmal atrial fibrillation: Secondary | ICD-10-CM

## 2021-04-09 DIAGNOSIS — I4819 Other persistent atrial fibrillation: Secondary | ICD-10-CM

## 2021-04-09 DIAGNOSIS — Z5181 Encounter for therapeutic drug level monitoring: Secondary | ICD-10-CM

## 2021-04-09 LAB — POCT INR: INR: 2.3 (ref 2.0–3.0)

## 2021-04-09 NOTE — Patient Instructions (Signed)
Description   Continue warfarin 1 tablet daily.  INR check 6 weeks Call if scheduled for any procedures or has any new medications  [phone]      

## 2021-04-19 ENCOUNTER — Other Ambulatory Visit: Payer: Self-pay | Admitting: Interventional Cardiology

## 2021-05-03 DIAGNOSIS — D485 Neoplasm of uncertain behavior of skin: Secondary | ICD-10-CM | POA: Diagnosis not present

## 2021-05-03 DIAGNOSIS — C44529 Squamous cell carcinoma of skin of other part of trunk: Secondary | ICD-10-CM | POA: Diagnosis not present

## 2021-05-03 DIAGNOSIS — C44629 Squamous cell carcinoma of skin of left upper limb, including shoulder: Secondary | ICD-10-CM | POA: Diagnosis not present

## 2021-05-03 DIAGNOSIS — Z85828 Personal history of other malignant neoplasm of skin: Secondary | ICD-10-CM | POA: Diagnosis not present

## 2021-05-15 NOTE — Progress Notes (Signed)
?  ?Cardiology Office Note ? ? ?Date:  05/17/2021  ? ?ID:  Robert [name], DOB 06-01-1927, MRN [id number] ? ?PCP:  Josetta Huddle, MD  ? ? ?Chief Complaint  ?Patient presents with  ? Follow-up  ? ?AFib ? ?Wt Readings from Last 3 Encounters:  ?05/17/21 173 lb (78.5 kg)  ?03/19/21 175 lb (79.4 kg)  ?01/31/21 176 lb (79.8 kg)  ?  ? ?  ?History of Present Illness: ?Robert [name] is a 88 y.o. male   who had a BMS to the circumflex in 2011.  He had AFib in 2/15.  Workup revealed severe AS.  He had AVR with single vessel CABG, Maze procedure and left atrial clip in 2015. Later in 2015, He had presyncope and his HR was in the 40s.  Resolved with stopping beta blocker and digoxin.  He did well for a while.  ?  ?In 2/16, he had palpitations and went to the ER. He was in atrial flutter 2:1 block.  He was sent home.  He was started on metoprolol and Eliquis in the clinic the next day.  Rhythm converted.   ?  ?Eliquis was changed to COumadin due to cost reasons. ?  ?Chronic left ankle swelling which progressed to the whole left leg in 2017, vein removed for CABG. It  Was caused by lymph node blocking vein flow. Initally diagnosed with PrCA [phone]. Recent check showed PSA was elevated and has reduced with Lupron.  Swelling is nearly completely resolved after Lupron.  He had a w/u for DVT which was negative.  Following with Dr. Rosana Hoes for his prostate cancer.  Undetectable PSA in 12/18. ?  ?Left leg cellulitis in 8/19, that has been persistent.  U/s showed no DVT. Finished antibiotic course.  ?  ?He had Covid in January 2021.  He received an antibody infusion.  He recovered very well and had minimal symptoms.  He got fully vacinated. ?  ?Chronic foot pain prevents exercise.  He feels like his joint pain gets worse when he takes his statin regularly.  He has had some improvement when he holds his statin.  He did stop taking his statin for a while and his LDL increased to 156 back in early February 2021. ?  ?He has had  AFib with RVR.  DCCV was successful but he went back to AFib after 2 days.  He has had some anemia as well.  Hbg 10 -10.5. ?  ?Noted that his HR goes to the 120-130 range back in 2022.  ?  ?  ?He had some SHOB in late 2022.    Got some steroids for back pain.  Had some relief, but pain has returned.  BNP down to 458 in 01/2021. (Had been > 2000 in 04/2020). BNP /Pacemaker findings led to daily Lasix daily.  K and Cr were well controlled in 01/2021.  ?  ?Still feels SHOB. Had laryngitis at Christmas 2022.  Took amoxicilllin with no relief.  Then given doxycycline.   ? ?He hand his wife had the flu in 04/2021.  Has DOE.  Activity limited by back and leg pain.  Moderate MR by TEE in 2022.  ? ?Continues to work in the yard.  DOE has persisted. ?Denies : Chest pain. Dizziness. Leg edema. Nitroglycerin use. Orthopnea. Palpitations. Paroxysmal nocturnal dyspnea. Syncope.   ? ?Past Medical History:  ?Diagnosis Date  ? Aortic stenosis   ? a. s/p tissue AVR 05/2013;  b. Echo (06/2013):  Mod LVH, EF 60-65%,  no RWMA, Gr 2 DD, AVR ok (mean 13 mmHg), MAC, mild MR, mod LAE, mild RAE, PASP 46 mmHg (mild pulmo HTN)  ? Blood loss anemia   ? a. 05/2009 a/w GIB.  ? CAD (coronary artery disease)   ? a. Cath 04/2009: BMS to LCx 04/2009; CTO of RCA with L-R collaterals, 40% ostial diag.  ? Carpal tunnel syndrome   ? bilateral, carpal tunnel release x 2  ? Esophageal ulcer   ? a. 05/2009 with hemorrhage - injected with epinephrine-Dr. Herbie Baltimore Buccini.  ? Esophagitis   ? a. 05/2009 a/w GIB.  ? GERD (gastroesophageal reflux disease)   ? EGD, Dr. Toney Rakes 2008  ? GI bleed   ? a. 05/2009 as above: with associated anemia. Lower esophageal ulcer with extensive erosive esophagitis and large clot by EGD 05/2009.   ? Hx of colonoscopy 2008  ? Dr. Toney Rakes, polyps- 5 yr f/u recommended  ? Hyperlipidemia   ? Hypertension   ? PAF (paroxysmal atrial fibrillation) ([place])   ? a. Post-op AVR;  b. 04/2014 recurrent.  ? Perforated ear drum   ? right ear  drum, tube in left ear drum, Dr. Constance Holster  ? Polymyalgia rheumatica ([place])   ? Prostate cancer ([place])   ? Vitamin D deficiency   ? ? ?Past Surgical History:  ?Procedure Laterality Date  ? AORTIC VALVE REPLACEMENT N/A 05/12/2013  ? Procedure: AORTIC VALVE REPLACEMENT (AVR);  Surgeon: Ivin Poot, MD;  Location: [place];  Service: Open Heart Surgery;  Laterality: N/A;  ? CARDIAC CATHETERIZATION  2011  ? CARDIOVERSION N/A 04/12/2020  ? Procedure: CARDIOVERSION;  Surgeon: Dorothy Spark, MD;  Location: [place];  Service: Cardiovascular;  Laterality: N/A;  ? CORONARY ARTERY BYPASS GRAFT N/A 05/12/2013  ? Procedure: CORONARY ARTERY BYPASS GRAFTING (CABG);  Surgeon: Ivin Poot, MD;  Location: [place];  Service: Open Heart Surgery;  Laterality: N/A;  CABG x 1 using left leg greater saphenous vein harvested endoscopically  ? INTRAOPERATIVE TRANSESOPHAGEAL ECHOCARDIOGRAM N/A 05/12/2013  ? Procedure: INTRAOPERATIVE TRANSESOPHAGEAL ECHOCARDIOGRAM;  Surgeon: Ivin Poot, MD;  Location: [place];  Service: Open Heart Surgery;  Laterality: N/A;  ? LEFT AND RIGHT HEART CATHETERIZATION WITH CORONARY ANGIOGRAM N/A 05/09/2013  ? Procedure: LEFT AND RIGHT HEART CATHETERIZATION WITH CORONARY ANGIOGRAM;  Surgeon: Lorretta Harp, MD;  Location[facility] CATH LAB;  Service: Cardiovascular;  Laterality: N/A;  ? MAZE N/A 05/12/2013  ? Procedure: MAZE;  Surgeon: Ivin Poot, MD;  Location: [place];  Service: Open Heart Surgery;  Laterality: N/A;  ? PACEMAKER IMPLANT N/A 05/21/2020  ? Procedure: PACEMAKER IMPLANT;  Surgeon: Evans Lance, MD;  Location: [place] CV LAB;  Service: Cardiovascular;  Laterality: N/A;  ? PROSTATECTOMY    ? TEE WITHOUT CARDIOVERSION N/A 04/12/2020  ? Procedure: TRANSESOPHAGEAL ECHOCARDIOGRAM (TEE);  Surgeon: Dorothy Spark, MD;  Location: [place];  Service: Cardiovascular;  Laterality: N/A;  ? ? ? ?Current Outpatient Medications  ?Medication Sig Dispense Refill  ? acetaminophen (TYLENOL) 500 MG tablet  Take 500-1,000 mg by mouth every 6 (six) hours as needed (pain).    ? cholecalciferol (VITAMIN D) 1000 UNITS tablet Take 1,000 Units by mouth 3 (three) times a week.    ? digoxin (LANOXIN) 0.125 MG tablet Take 1 tablet (0.125 mg total) by mouth daily. 90 tablet 3  ? losartan (COZAAR) 50 MG tablet TAKE 1 TABLET DAILY 90 tablet 3  ? metoprolol succinate (TOPROL-XL) 50 MG 24 hr tablet Take 1 tablet (50  mg total) by mouth in the morning and at bedtime. 180 tablet 3  ? omeprazole (PRILOSEC) 20 MG capsule Take 20 mg by mouth every morning.    ? Potassium 99 MG TABS Take 99 mg by mouth 2 (two) times a week.    ? rosuvastatin (CRESTOR) 20 MG tablet TAKE 1 TABLET DAILY 90 tablet 3  ? vitamin C (ASCORBIC ACID) 500 MG tablet Take 500 mg by mouth 3 (three) times a week.    ? warfarin (COUMADIN) 5 MG tablet TAKE 1 -2 TABLETS DAILY AS DIRECTED BY THE COUMADIN CLINIC 115 tablet 3  ? furosemide (LASIX) 40 MG tablet Take 1 tablet (40 mg total) by mouth daily. 90 tablet 3  ? ?No current facility-administered medications for this visit.  ? ? ?Allergies:   Amoxicillin, Lisinopril, Kenalog [triamcinolone], and Lasix [furosemide]  ? ? ?Social History:  The patient  reports that he has never smoked. He has never used smokeless tobacco. He reports that he does not drink alcohol and does not use drugs.  ? ?Family History:  The patient's family history includes Breast cancer in his daughter; CVA in his mother; Kidney disease in his father.  ? ? ?ROS:  Please see the history of present illness.   Otherwise, review of systems are positive for DOE.   All other systems are reviewed and negative.  ? ? ?PHYSICAL EXAM: ?VS:  BP 134/62   Pulse 75   Ht _0  (1.676 m)   Wt 173 lb (78.5 kg)   SpO2 97%   BMI 27.92 kg/m?  , BMI Body mass index is 27.92 kg/m?. ?GEN: Well nourished, well developed, in no acute distress ?HEENT: normal ?Neck: no JVD, carotid bruits, or masses ?Cardiac: irregular; 1/6 systolic murmur, no rubs, or gallops,no edema   ?Respiratory:  clear to auscultation bilaterally, normal work of breathing ?GI: soft, nontender, nondistended, + BS ?MS: no deformity or atrophy ?Skin: warm and dry, no rash ?Neuro:  Strength and sensation are intact

## 2021-05-17 ENCOUNTER — Ambulatory Visit (INDEPENDENT_AMBULATORY_CARE_PROVIDER_SITE_OTHER): Payer: Medicare Other | Admitting: Interventional Cardiology

## 2021-05-17 ENCOUNTER — Other Ambulatory Visit: Payer: Self-pay

## 2021-05-17 ENCOUNTER — Other Ambulatory Visit (HOSPITAL_COMMUNITY): Payer: Medicare Other

## 2021-05-17 ENCOUNTER — Encounter: Payer: Self-pay | Admitting: Interventional Cardiology

## 2021-05-17 VITALS — BP 134/62 | HR 75 | Ht 66.0 in | Wt 173.0 lb

## 2021-05-17 DIAGNOSIS — Z950 Presence of cardiac pacemaker: Secondary | ICD-10-CM

## 2021-05-17 DIAGNOSIS — I10 Essential (primary) hypertension: Secondary | ICD-10-CM

## 2021-05-17 DIAGNOSIS — E785 Hyperlipidemia, unspecified: Secondary | ICD-10-CM

## 2021-05-17 DIAGNOSIS — Z952 Presence of prosthetic heart valve: Secondary | ICD-10-CM | POA: Diagnosis not present

## 2021-05-17 DIAGNOSIS — I4819 Other persistent atrial fibrillation: Secondary | ICD-10-CM | POA: Diagnosis not present

## 2021-05-17 DIAGNOSIS — I25118 Atherosclerotic heart disease of native coronary artery with other forms of angina pectoris: Secondary | ICD-10-CM | POA: Diagnosis not present

## 2021-05-17 DIAGNOSIS — R0602 Shortness of breath: Secondary | ICD-10-CM | POA: Diagnosis not present

## 2021-05-17 NOTE — Patient Instructions (Signed)
Medication Instructions:  ?Your physician recommends that you continue on your current medications as directed. Please refer to the Current Medication list given to you today. ? ?*If you need a refill on your cardiac medications before your next appointment, please call your pharmacy* ? ? ?Lab Work: ?none ?If you have labs (blood work) drawn today and your tests are completely normal, you will receive your results only by: ?MyChart Message (if you have MyChart) OR ?A paper copy in the mail ?If you have any lab test that is abnormal or we need to change your treatment, we will call you to review the results. ? ? ?Testing/Procedures: ?Your physician has requested that you have an echocardiogram. Echocardiography is a painless test that uses sound waves to create images of your heart. It provides your doctor with information about the size and shape of your heart and how well your heart?s chambers and valves are working. This procedure takes approximately one hour. There are no restrictions for this procedure. ? ? ? ?Follow-Up: ?At [facility], you and your health needs are our priority.  As part of our continuing mission to provide you with exceptional heart care, we have created designated Provider Care Teams.  These Care Teams include your primary Cardiologist (physician) and Advanced Practice Providers (APPs -  Physician Assistants and Nurse Practitioners) who all work together to provide you with the care you need, when you need it. ? ?We recommend signing up for the patient portal called "MyChart".  Sign up information is provided on this After Visit Summary.  MyChart is used to connect with patients for Virtual Visits (Telemedicine).  Patients are able to view lab/test results, encounter notes, upcoming appointments, etc.  Non-urgent messages can be sent to your provider as well.   ?To learn more about what you can do with MyChart, go to [email or web address].   ? ?Your next appointment:   ?12  month(s) ? ?The format for your next appointment:   ?In Person ? ?Provider:   ?Larae Grooms, MD   ? ? ?Other Instructions ?Please schedule EP follow for patient.  (In recalls) ? ?

## 2021-05-21 ENCOUNTER — Other Ambulatory Visit: Payer: Self-pay

## 2021-05-21 ENCOUNTER — Ambulatory Visit (INDEPENDENT_AMBULATORY_CARE_PROVIDER_SITE_OTHER): Payer: Medicare Other

## 2021-05-21 DIAGNOSIS — Z5181 Encounter for therapeutic drug level monitoring: Secondary | ICD-10-CM | POA: Diagnosis not present

## 2021-05-21 DIAGNOSIS — I480 Paroxysmal atrial fibrillation: Secondary | ICD-10-CM

## 2021-05-21 DIAGNOSIS — I495 Sick sinus syndrome: Secondary | ICD-10-CM

## 2021-05-21 DIAGNOSIS — I4819 Other persistent atrial fibrillation: Secondary | ICD-10-CM | POA: Diagnosis not present

## 2021-05-21 LAB — POCT INR: INR: 2.3 (ref 2.0–3.0)

## 2021-05-21 NOTE — Patient Instructions (Signed)
Description   Continue warfarin 1 tablet daily.  INR check 6 weeks Call if scheduled for any procedures or has any new medications  [phone]      

## 2021-05-22 LAB — CUP PACEART REMOTE DEVICE CHECK
Battery Remaining Longevity: 101 mo
Battery Remaining Percentage: 94 %
Battery Voltage: 3.04 V
Brady Statistic AP VP Percent: 0 %
Brady Statistic AP VS Percent: 0 %
Brady Statistic AS VP Percent: 0 %
Brady Statistic AS VS Percent: 0 %
Brady Statistic RA Percent Paced: 1 %
Brady Statistic RV Percent Paced: 16 %
Date Time Interrogation Session: 20230321233133
Implantable Lead Implant Date: 20220321
Implantable Lead Implant Date: 20220321
Implantable Lead Location: 753859
Implantable Lead Location: 753860
Implantable Pulse Generator Implant Date: 20220321
Lead Channel Impedance Value: 410 Ohm
Lead Channel Impedance Value: 510 Ohm
Lead Channel Pacing Threshold Amplitude: 0.75 V
Lead Channel Pacing Threshold Pulse Width: 0.4 ms
Lead Channel Sensing Intrinsic Amplitude: 1.1 mV
Lead Channel Sensing Intrinsic Amplitude: 10.6 mV
Lead Channel Setting Pacing Amplitude: 2.5 V
Lead Channel Setting Pacing Amplitude: 3.5 V
Lead Channel Setting Pacing Pulse Width: 0.4 ms
Lead Channel Setting Sensing Sensitivity: 2 mV
Pulse Gen Model: 2272
Pulse Gen Serial Number: 3909458

## 2021-05-28 ENCOUNTER — Other Ambulatory Visit: Payer: Self-pay

## 2021-05-28 ENCOUNTER — Ambulatory Visit (HOSPITAL_COMMUNITY): Payer: Medicare Other | Attending: Cardiology

## 2021-05-28 DIAGNOSIS — I4819 Other persistent atrial fibrillation: Secondary | ICD-10-CM | POA: Diagnosis not present

## 2021-05-28 DIAGNOSIS — Z952 Presence of prosthetic heart valve: Secondary | ICD-10-CM | POA: Insufficient documentation

## 2021-05-28 DIAGNOSIS — R0602 Shortness of breath: Secondary | ICD-10-CM | POA: Diagnosis not present

## 2021-05-28 LAB — ECHOCARDIOGRAM COMPLETE
AV Mean grad: 10.2 mmHg
AV Peak grad: 19 mmHg
Ao pk vel: 2.18 m/s
S' Lateral: 3.4 cm

## 2021-06-05 NOTE — Progress Notes (Signed)
Remote pacemaker transmission.   

## 2021-06-17 ENCOUNTER — Ambulatory Visit
Admission: RE | Admit: 2021-06-17 | Discharge: 2021-06-17 | Disposition: A | Discharge status: Home / Self Care | Payer: Medicare Other | Source: Ambulatory Visit | Attending: Internal Medicine | Admitting: Internal Medicine

## 2021-06-17 ENCOUNTER — Other Ambulatory Visit: Payer: Self-pay | Admitting: Internal Medicine

## 2021-06-17 DIAGNOSIS — R059 Cough, unspecified: Secondary | ICD-10-CM | POA: Diagnosis not present

## 2021-06-17 DIAGNOSIS — R052 Subacute cough: Secondary | ICD-10-CM

## 2021-06-17 DIAGNOSIS — R918 Other nonspecific abnormal finding of lung field: Secondary | ICD-10-CM | POA: Diagnosis not present

## 2021-06-18 ENCOUNTER — Other Ambulatory Visit: Payer: Self-pay | Admitting: *Deleted

## 2021-06-18 DIAGNOSIS — M810 Age-related osteoporosis without current pathological fracture: Secondary | ICD-10-CM | POA: Diagnosis not present

## 2021-06-18 DIAGNOSIS — Z191 Hormone sensitive malignancy status: Secondary | ICD-10-CM | POA: Diagnosis not present

## 2021-06-18 DIAGNOSIS — Z79818 Long term (current) use of other agents affecting estrogen receptors and estrogen levels: Secondary | ICD-10-CM | POA: Diagnosis not present

## 2021-06-18 DIAGNOSIS — R600 Localized edema: Secondary | ICD-10-CM | POA: Diagnosis not present

## 2021-06-18 DIAGNOSIS — J3489 Other specified disorders of nose and nasal sinuses: Secondary | ICD-10-CM | POA: Diagnosis not present

## 2021-06-18 DIAGNOSIS — C61 Malignant neoplasm of prostate: Secondary | ICD-10-CM | POA: Diagnosis not present

## 2021-06-18 MED ORDER — METOPROLOL SUCCINATE ER 50 MG PO TB24
50.0000 mg | ORAL_TABLET | Freq: Two times a day (BID) | ORAL | 3 refills | Status: DC
Start: 2021-06-18 — End: 2022-04-23

## 2021-07-02 ENCOUNTER — Ambulatory Visit (INDEPENDENT_AMBULATORY_CARE_PROVIDER_SITE_OTHER): Payer: Medicare Other

## 2021-07-02 DIAGNOSIS — I480 Paroxysmal atrial fibrillation: Secondary | ICD-10-CM | POA: Diagnosis not present

## 2021-07-02 DIAGNOSIS — Z5181 Encounter for therapeutic drug level monitoring: Secondary | ICD-10-CM | POA: Diagnosis not present

## 2021-07-02 DIAGNOSIS — I4819 Other persistent atrial fibrillation: Secondary | ICD-10-CM

## 2021-07-02 LAB — POCT INR: INR: 3 (ref 2.0–3.0)

## 2021-07-02 NOTE — Patient Instructions (Signed)
Description   Continue warfarin 1 tablet daily.  INR check 6 weeks Call if scheduled for any procedures or has any new medications  [phone]      

## 2021-07-03 ENCOUNTER — Other Ambulatory Visit: Payer: Self-pay | Admitting: Internal Medicine

## 2021-07-03 DIAGNOSIS — R490 Dysphonia: Secondary | ICD-10-CM | POA: Diagnosis not present

## 2021-07-05 ENCOUNTER — Ambulatory Visit
Admission: RE | Admit: 2021-07-05 | Discharge: 2021-07-05 | Disposition: A | Discharge status: Home / Self Care | Payer: Medicare Other | Source: Ambulatory Visit | Attending: Internal Medicine | Admitting: Internal Medicine

## 2021-07-05 DIAGNOSIS — I6529 Occlusion and stenosis of unspecified carotid artery: Secondary | ICD-10-CM | POA: Diagnosis not present

## 2021-07-05 DIAGNOSIS — R490 Dysphonia: Secondary | ICD-10-CM | POA: Diagnosis not present

## 2021-07-05 DIAGNOSIS — M47812 Spondylosis without myelopathy or radiculopathy, cervical region: Secondary | ICD-10-CM | POA: Diagnosis not present

## 2021-07-05 DIAGNOSIS — D479 Neoplasm of uncertain behavior of lymphoid, hematopoietic and related tissue, unspecified: Secondary | ICD-10-CM | POA: Diagnosis not present

## 2021-07-09 DIAGNOSIS — J370 Chronic laryngitis: Secondary | ICD-10-CM | POA: Diagnosis not present

## 2021-07-09 DIAGNOSIS — R590 Localized enlarged lymph nodes: Secondary | ICD-10-CM | POA: Diagnosis not present

## 2021-07-10 ENCOUNTER — Other Ambulatory Visit (HOSPITAL_COMMUNITY): Payer: Self-pay | Admitting: Otolaryngology

## 2021-07-10 ENCOUNTER — Other Ambulatory Visit: Payer: Self-pay | Admitting: Otolaryngology

## 2021-07-10 DIAGNOSIS — R590 Localized enlarged lymph nodes: Secondary | ICD-10-CM

## 2021-07-16 NOTE — Progress Notes (Unsigned)
Robert Cleveland, MD  Donita Brooks D ?Ok  ? ?[place] core L cerv LAN  ?R/o lymphoma  ? ?DDH  ?

## 2021-07-17 ENCOUNTER — Telehealth: Payer: Self-pay | Admitting: Pharmacist

## 2021-07-17 ENCOUNTER — Other Ambulatory Visit: Payer: Self-pay | Admitting: Podiatry

## 2021-07-17 NOTE — Telephone Encounter (Signed)
Pt on warfarin for afib. Also with hx of tissue AVR. ? ?CHA2DS2-VASc Score = 5  ?This indicates a 7.2% annual risk of stroke. ?The patient's score is based upon: ?CHF History: 1 ?HTN History: 1 ?Diabetes History: 0 ?Stroke History: 0 ?Vascular Disease History: 1 ?Age Score: 2 ?Gender Score: 0 ? ?CrCl 85m/min ?Platelet count 148K ? ?Ok to hold warfarin for 4 days prior to procedure as requested. [facility] ?

## 2021-07-17 NOTE — Telephone Encounter (Signed)
-----   Message from Jettie Booze, MD sent at 07/17/2021 12:55 PM EDT ----- ?Regarding: FW: Coumadin ?Kadejah Sandiford, ? ?Can you run him through the algorithm for holding anticoagulation. THanks. ? ?JV ?----- Message ----- ?From: Roosvelt Maser ?Sent: 07/16/2021   8:20 AM EDT ?To: Jettie Booze, MD, Evans Lance, MD, # ?Subject: Coumadin                                      ? ? ? ?Dr. Lovena Le, ? ?Dr. Constance Holster has requested this patient to have an US Biopsy for parotid lesion. ? ?He will need to hold his Coumadin 4 days prior to having this procedure. ? ?Can he hold this bloodthinner? ? ?Thank you, ?Vivien Rota ?[place] ?Central Scheduling  ? ? ? ?

## 2021-07-18 ENCOUNTER — Encounter: Payer: Self-pay | Admitting: *Deleted

## 2021-07-18 NOTE — Progress Notes (Unsigned)
Jettie Booze, MD  Evans Lance, MD; Roosvelt Maser OK to hold COumadin four days prior to procedure.   JV           Supple, Harlon Flor, RPH-CPP  Jettie Booze, MD; Evans Lance, MD; Roosvelt Maser Yes pt can hold warfarin for 4 days prior to procedure.   Thanks,  [employer]

## 2021-07-24 ENCOUNTER — Other Ambulatory Visit: Payer: Self-pay | Admitting: Student

## 2021-07-24 DIAGNOSIS — I10 Essential (primary) hypertension: Secondary | ICD-10-CM

## 2021-07-25 ENCOUNTER — Ambulatory Visit (HOSPITAL_COMMUNITY)
Admission: RE | Admit: 2021-07-25 | Discharge: 2021-07-25 | Disposition: A | Discharge status: Home / Self Care | Payer: Medicare Other | Source: Ambulatory Visit | Attending: Otolaryngology | Admitting: Otolaryngology

## 2021-07-25 DIAGNOSIS — I10 Essential (primary) hypertension: Secondary | ICD-10-CM

## 2021-07-25 DIAGNOSIS — C9110 Chronic lymphocytic leukemia of B-cell type not having achieved remission: Secondary | ICD-10-CM | POA: Insufficient documentation

## 2021-07-25 DIAGNOSIS — C8581 Other specified types of non-Hodgkin lymphoma, lymph nodes of head, face, and neck: Secondary | ICD-10-CM | POA: Diagnosis not present

## 2021-07-25 DIAGNOSIS — R590 Localized enlarged lymph nodes: Secondary | ICD-10-CM | POA: Diagnosis not present

## 2021-07-25 DIAGNOSIS — C8591 Non-Hodgkin lymphoma, unspecified, lymph nodes of head, face, and neck: Secondary | ICD-10-CM | POA: Diagnosis not present

## 2021-07-25 MED ORDER — SODIUM CHLORIDE 0.9 % IV SOLN: INTRAVENOUS | Status: DC

## 2021-07-25 MED ORDER — LIDOCAINE HCL (PF) 1 % IJ SOLN
INTRAMUSCULAR | Status: AC
  Filled 2021-07-25: qty 30

## 2021-07-25 NOTE — Procedures (Signed)
Interventional Radiology Procedure Note  Procedure: US guided left neck lymph node biopsy  Indication: Cervical lymphadenopathy  Findings: Please refer to procedural dictation for full description.  Complications: None  EBL: < 10 mL  Robert Roux, MD [phone]

## 2021-07-26 ENCOUNTER — Telehealth: Payer: Self-pay | Admitting: Interventional Cardiology

## 2021-07-26 ENCOUNTER — Telehealth: Payer: Self-pay

## 2021-07-26 NOTE — Telephone Encounter (Signed)
Patient's daughter called and made mention that patient had a procedure done yesterday and she wants to know when to start patient back on bloodthinners. Says that the office told her to contact us in regards to restarting bloodthinner.

## 2021-07-26 NOTE — Telephone Encounter (Signed)
Spoke with pt's daughter and advised she is not listed on pt's DPR and she is not currently with pt.  Advised RN will contact pt.  Pt's daughter verbalizes understanding and agrees with current plan.

## 2021-07-26 NOTE — Telephone Encounter (Signed)
Spoke with pt's wife, DPR and advised the ordering provider will need to give clearance when it is safe for pt to restart Coumadin.  This was confirmed with Fuller Canada, RPH.  Pt's wife verbalizes understanding and agrees with current plan.   Pt's wife requests DPR forms be mailed so pt may update.  DPR form placed in the mail as requested.

## 2021-07-26 NOTE — Telephone Encounter (Signed)
Received phone call from Robert [name], scheduling states pt is inquiring as to when he will need to restart Coumadin as he had a core biopsy of Parotid yesterday via interventional radiology.  Advised Doren Custard pt will need to contact ordering provider for direction on when it will be safe to restart Coumadin. Confirmed this with Fuller Canada, RPH.

## 2021-07-30 ENCOUNTER — Other Ambulatory Visit: Payer: Self-pay | Admitting: *Deleted

## 2021-07-30 MED ORDER — LOSARTAN POTASSIUM 50 MG PO TABS: 50.0000 mg | ORAL_TABLET | Freq: Every day | ORAL | 3 refills | Status: DC

## 2021-07-31 LAB — SURGICAL PATHOLOGY

## 2021-08-06 ENCOUNTER — Telehealth: Payer: Self-pay | Admitting: Hematology

## 2021-08-06 NOTE — Telephone Encounter (Signed)
Scheduled appt per 6/6 referral. Pt's daughter is aware of appt date and time. Pt's daughter is aware to arrive 15 mins prior to appt time and to bring and updated insurance card. Pt's daughter is aware of appt location.

## 2021-08-20 ENCOUNTER — Ambulatory Visit (INDEPENDENT_AMBULATORY_CARE_PROVIDER_SITE_OTHER): Payer: Medicare Other

## 2021-08-20 DIAGNOSIS — Z5181 Encounter for therapeutic drug level monitoring: Secondary | ICD-10-CM

## 2021-08-20 DIAGNOSIS — I4819 Other persistent atrial fibrillation: Secondary | ICD-10-CM

## 2021-08-20 DIAGNOSIS — I495 Sick sinus syndrome: Secondary | ICD-10-CM

## 2021-08-20 DIAGNOSIS — I480 Paroxysmal atrial fibrillation: Secondary | ICD-10-CM | POA: Diagnosis not present

## 2021-08-20 LAB — CUP PACEART REMOTE DEVICE CHECK
Battery Remaining Longevity: 98 mo
Battery Remaining Percentage: 91 %
Battery Voltage: 3.02 V
Brady Statistic AP VP Percent: 0 %
Brady Statistic AP VS Percent: 0 %
Brady Statistic AS VP Percent: 0 %
Brady Statistic AS VS Percent: 0 %
Brady Statistic RA Percent Paced: 1 %
Brady Statistic RV Percent Paced: 15 %
Date Time Interrogation Session: 20230620020031
Implantable Lead Implant Date: 20220321
Implantable Lead Implant Date: 20220321
Implantable Lead Location: 753859
Implantable Lead Location: 753860
Implantable Pulse Generator Implant Date: 20220321
Lead Channel Impedance Value: 410 Ohm
Lead Channel Impedance Value: 490 Ohm
Lead Channel Pacing Threshold Amplitude: 0.75 V
Lead Channel Pacing Threshold Pulse Width: 0.4 ms
Lead Channel Sensing Intrinsic Amplitude: 1.1 mV
Lead Channel Sensing Intrinsic Amplitude: 11 mV
Lead Channel Setting Pacing Amplitude: 2.5 V
Lead Channel Setting Pacing Amplitude: 3.5 V
Lead Channel Setting Pacing Pulse Width: 0.4 ms
Lead Channel Setting Sensing Sensitivity: 2 mV
Pulse Gen Model: 2272
Pulse Gen Serial Number: 3909458

## 2021-08-20 LAB — POCT INR: INR: 3.6 — AB (ref 2.0–3.0)

## 2021-08-20 NOTE — Patient Instructions (Signed)
Description   Only take 0.5 tablet today and then continue warfarin 1 tablet daily.  INR check 6 weeks Call if scheduled for any procedures or has any new medications  [phone]

## 2021-08-21 ENCOUNTER — Other Ambulatory Visit: Payer: Self-pay

## 2021-08-21 ENCOUNTER — Inpatient Hospital Stay: Payer: Medicare Other | Attending: Hematology | Admitting: Hematology

## 2021-08-21 ENCOUNTER — Inpatient Hospital Stay: Payer: Medicare Other

## 2021-08-21 VITALS — BP 108/71 | HR 85 | Temp 97.9°F | Resp 18 | Wt 172.5 lb

## 2021-08-21 DIAGNOSIS — I2510 Atherosclerotic heart disease of native coronary artery without angina pectoris: Secondary | ICD-10-CM | POA: Diagnosis not present

## 2021-08-21 DIAGNOSIS — C9110 Chronic lymphocytic leukemia of B-cell type not having achieved remission: Secondary | ICD-10-CM | POA: Insufficient documentation

## 2021-08-21 DIAGNOSIS — Z803 Family history of malignant neoplasm of breast: Secondary | ICD-10-CM | POA: Insufficient documentation

## 2021-08-21 DIAGNOSIS — I10 Essential (primary) hypertension: Secondary | ICD-10-CM | POA: Diagnosis not present

## 2021-08-21 LAB — LACTATE DEHYDROGENASE: LDH: 193 U/L — ABNORMAL HIGH (ref 98–192)

## 2021-08-21 LAB — CBC WITH DIFFERENTIAL (CANCER CENTER ONLY)
Abs Immature Granulocytes: 0.06 10*3/uL (ref 0.00–0.07)
Basophils Absolute: 0 10*3/uL (ref 0.0–0.1)
Basophils Relative: 0 %
Eosinophils Absolute: 0.2 10*3/uL (ref 0.0–0.5)
Eosinophils Relative: 2 %
HCT: 29.7 % — ABNORMAL LOW (ref 39.0–52.0)
Hemoglobin: 9.7 g/dL — ABNORMAL LOW (ref 13.0–17.0)
Immature Granulocytes: 1 %
Lymphocytes Relative: 55 %
Lymphs Abs: 6.3 10*3/uL — ABNORMAL HIGH (ref 0.7–4.0)
MCH: 30.5 pg (ref 26.0–34.0)
MCHC: 32.7 g/dL (ref 30.0–36.0)
MCV: 93.4 fL (ref 80.0–100.0)
Monocytes Absolute: 0.7 10*3/uL (ref 0.1–1.0)
Monocytes Relative: 6 %
Neutro Abs: 4.1 10*3/uL (ref 1.7–7.7)
Neutrophils Relative %: 36 %
Platelet Count: 142 10*3/uL — ABNORMAL LOW (ref 150–400)
RBC: 3.18 MIL/uL — ABNORMAL LOW (ref 4.22–5.81)
RDW: 16 % — ABNORMAL HIGH (ref 11.5–15.5)
Smear Review: NORMAL
WBC Count: 11.4 10*3/uL — ABNORMAL HIGH (ref 4.0–10.5)
nRBC: 0 % (ref 0.0–0.2)

## 2021-08-21 LAB — CMP (CANCER CENTER ONLY)
ALT: 14 U/L (ref 0–44)
AST: 16 U/L (ref 15–41)
Albumin: 4.2 g/dL (ref 3.5–5.0)
Alkaline Phosphatase: 31 U/L — ABNORMAL LOW (ref 38–126)
Anion gap: 7 (ref 5–15)
BUN: 14 mg/dL (ref 8–23)
CO2: 29 mmol/L (ref 22–32)
Calcium: 9.6 mg/dL (ref 8.9–10.3)
Chloride: 104 mmol/L (ref 98–111)
Creatinine: 0.86 mg/dL (ref 0.61–1.24)
GFR, Estimated: >60 mL/min (ref >60)
Glucose, Bld: 104 mg/dL — ABNORMAL HIGH (ref 70–99)
Potassium: 3.8 mmol/L (ref 3.5–5.1)
Sodium: 140 mmol/L (ref 135–145)
Total Bilirubin: 0.8 mg/dL (ref 0.3–1.2)
Total Protein: 6.8 g/dL (ref 6.5–8.1)

## 2021-08-21 LAB — HEPATITIS C ANTIBODY: HCV Ab: NONREACTIVE

## 2021-08-21 LAB — ABO/RH: ABO/RH(D): O NEG

## 2021-08-21 NOTE — Progress Notes (Addendum)
[name]   HEMATOLOGY/ONCOLOGY CONSULTATION NOTE  Date of Service: 08/21/2021  Patient Care Team: Josetta Huddle, MD as PCP - General (Internal Medicine) Jettie Booze, MD as PCP - Cardiology (Cardiology)  CHIEF COMPLAINTS/PURPOSE OF CONSULTATION:  Newly diagnosed small lymphocytic lmyphoma  HISTORY OF PRESENTING ILLNESS:   Robert [name] is a wonderful 88 y.o. male who has been referred to [place] by Dr .Inda Merlin, Herbie Baltimore, MD/Jeffrey Constance Holster, MD for evaluation and management of small lymphocytic lymphoma.  Patient has a history of hypertension, dyslipidemia, paroxysmal A-fib, aortic stenosis status post tissue aortic valve replacement in March 2015, previous history of prostate cancer who presented with hoarseness of his voice and had a CT of the neck by his primary care physician on 07/05/2021 which showed diffuse adenopathy suspicious for acute or chronic lymphoproliferative disorder.  Patient subsequently had an ultrasound-guided core needle biopsy of enlarged left neck lymph node. On pathology of this showed findings of small lymphocytic lymphoma. CBC done on 08/21/2021 showed a WBC count of 11.4 with a lymphocyte count of 6.3 k, platelets of 142k and hemoglobin of 9.7 Patient notes no fevers no chills no night sweats no unexpected weight loss. He has had chronic multifactorial fatigue. Patient notes no other new lumps or bumps in the body. He is here for further discussion of management of his newly diagnosed SLL/CLL. Pathology results showing no CLL/SLL its implications and natural history, treatment considerations and possible treatment protocols were discussed in detail with the patient and remember.   MEDICAL HISTORY:  Past Medical History:  Diagnosis Date   Aortic stenosis    a. s/p tissue AVR 05/2013;  b. Echo (06/2013):  Mod LVH, EF 60-65%, no RWMA, Gr 2 DD, AVR ok (mean 13 mmHg), MAC, mild MR, mod LAE, mild RAE, PASP 46 mmHg (mild pulmo HTN)   Blood loss anemia    a. 05/2009 a/w  GIB.   CAD (coronary artery disease)    a. Cath 04/2009: BMS to LCx 04/2009; CTO of RCA with L-R collaterals, 40% ostial diag.   Carpal tunnel syndrome    bilateral, carpal tunnel release x 2   Esophageal ulcer    a. 05/2009 with hemorrhage - injected with epinephrine-Dr. Herbie Baltimore Buccini.   Esophagitis    a. 05/2009 a/w GIB.   GERD (gastroesophageal reflux disease)    EGD, Dr. Toney Rakes 2008   GI bleed    a. 05/2009 as above: with associated anemia. Lower esophageal ulcer with extensive erosive esophagitis and large clot by EGD 05/2009.    Hx of colonoscopy 2008   Dr. Toney Rakes, polyps- 5 yr f/u recommended   Hyperlipidemia    Hypertension    PAF (paroxysmal atrial fibrillation) ([place])    a. Post-op AVR;  b. 04/2014 recurrent.   Perforated ear drum    right ear drum, tube in left ear drum, Dr. Constance Holster   Polymyalgia rheumatica [facility])    Prostate cancer [facility])    Vitamin D deficiency     SURGICAL HISTORY: Past Surgical History:  Procedure Laterality Date   AORTIC VALVE REPLACEMENT N/A 05/12/2013   Procedure: AORTIC VALVE REPLACEMENT (AVR);  Surgeon: Ivin Poot, MD;  Location: [place];  Service: Open Heart Surgery;  Laterality: N/A;   CARDIAC CATHETERIZATION  2011   CARDIOVERSION N/A 04/12/2020   Procedure: CARDIOVERSION;  Surgeon: Dorothy Spark, MD;  Location: [place];  Service: Cardiovascular;  Laterality: N/A;   CORONARY ARTERY BYPASS GRAFT N/A 05/12/2013   Procedure: CORONARY ARTERY BYPASS GRAFTING (CABG);  Surgeon:  Ivin Poot, MD;  Location: [place];  Service: Open Heart Surgery;  Laterality: N/A;  CABG x 1 using left leg greater saphenous vein harvested endoscopically   INTRAOPERATIVE TRANSESOPHAGEAL ECHOCARDIOGRAM N/A 05/12/2013   Procedure: INTRAOPERATIVE TRANSESOPHAGEAL ECHOCARDIOGRAM;  Surgeon: Ivin Poot, MD;  Location: [place];  Service: Open Heart Surgery;  Laterality: N/A;   LEFT AND RIGHT HEART CATHETERIZATION WITH CORONARY ANGIOGRAM N/A 05/09/2013    Procedure: LEFT AND RIGHT HEART CATHETERIZATION WITH CORONARY ANGIOGRAM;  Surgeon: Lorretta Harp, MD;  Location: [facility] CATH LAB;  Service: Cardiovascular;  Laterality: N/A;   MAZE N/A 05/12/2013   Procedure: MAZE;  Surgeon: Ivin Poot, MD;  Location: [place];  Service: Open Heart Surgery;  Laterality: N/A;   PACEMAKER IMPLANT N/A 05/21/2020   Procedure: PACEMAKER IMPLANT;  Surgeon: Evans Lance, MD;  Location: [place] CV LAB;  Service: Cardiovascular;  Laterality: N/A;   PROSTATECTOMY     TEE WITHOUT CARDIOVERSION N/A 04/12/2020   Procedure: TRANSESOPHAGEAL ECHOCARDIOGRAM (TEE);  Surgeon: Dorothy Spark, MD;  Location: [facility];  Service: Cardiovascular;  Laterality: N/A;    SOCIAL HISTORY: Social History   Socioeconomic History   Marital status: Married    Spouse name: Not on file   Number of children: Not on file   Years of education: Not on file   Highest education level: Not on file  Occupational History   Not on file  Tobacco Use   Smoking status: Never   Smokeless tobacco: Never  Vaping Use   Vaping Use: Never used  Substance and Sexual Activity   Alcohol use: No   Drug use: No   Sexual activity: Not on file  Other Topics Concern   Not on file  Social History Narrative   Not on file   Social Determinants of Health   Financial Resource Strain: Not on file  Food Insecurity: Not on file  Transportation Needs: Not on file  Physical Activity: Not on file  Stress: Not on file  Social Connections: Not on file  Intimate Partner Violence: Not on file    FAMILY HISTORY: Family History  Problem Relation Age of Onset   CVA Mother    Kidney disease Father    Breast cancer Daughter     ALLERGIES:  is allergic to amoxicillin, lisinopril, and kenalog [triamcinolone].  MEDICATIONS:  Current Outpatient Medications  Medication Sig Dispense Refill   acetaminophen (TYLENOL) 325 MG tablet Take 650 mg by mouth every 6 (six) hours as needed for moderate pain.      Ascorbic Acid (VITAMIN C PO) Take 1 tablet by mouth 2 (two) times a week.     Cholecalciferol (DIALYVITE VITAMIN D 5000) 125 MCG (5000 UT) capsule Take 5,000 Units by mouth 2 (two) times a week.     digoxin (LANOXIN) 0.125 MG tablet Take 1 tablet (0.125 mg total) by mouth daily. 90 tablet 3   furosemide (LASIX) 40 MG tablet Take 1 tablet (40 mg total) by mouth daily. 90 tablet 3   KLOR-CON M20 20 MEQ tablet Take 20 mEq by mouth daily.     losartan (COZAAR) 50 MG tablet Take 1 tablet (50 mg total) by mouth daily. 90 tablet 3   metoprolol succinate (TOPROL-XL) 50 MG 24 hr tablet Take 1 tablet (50 mg total) by mouth in the morning and at bedtime. 180 tablet 3   omeprazole (PRILOSEC) 20 MG capsule Take 20 mg by mouth every morning.     rosuvastatin (CRESTOR) 20 MG  tablet TAKE 1 TABLET DAILY 90 tablet 3   warfarin (COUMADIN) 5 MG tablet TAKE 1 -2 TABLETS DAILY AS DIRECTED BY THE COUMADIN CLINIC (Patient taking differently: Take 5 mg by mouth daily.) 115 tablet 3   furosemide (LASIX) 20 MG tablet Take 20 mg by mouth every other day. (Patient not taking: Reported on 08/21/2021)     No current facility-administered medications for this visit.    REVIEW OF SYSTEMS:   10 Point review of Systems was done is negative except as noted above.  PHYSICAL EXAMINATION: ECOG PERFORMANCE STATUS: 1 - Symptomatic but completely ambulatory  . Vitals:   08/21/21 1055  BP: 108/71  Pulse: 85  Resp: 18  Temp: 97.9 F (36.6 C)  SpO2: 98%   Filed Weights   08/21/21 1055  Weight: 172 lb 8 oz (78.2 kg)   .Body mass index is 25.11 kg/m.  GENERAL:alert, in no acute distress and comfortable SKIN: no acute rashes, no significant lesions EYES: conjunctiva are pink and non-injected, sclera anicteric OROPHARYNX: MMM, no exudates, no oropharyngeal erythema or ulceration NECK: supple, no JVD LYMPH:  no palpable lymphadenopathy in the axillary or inguinal regions.  Palpable bilateral cervical lymph nodes left  more than right LUNGS: clear to auscultation b/l with normal respiratory effort HEART: regular rate & rhythm ABDOMEN:  normoactive bowel sounds , non tender, not distended.  No palpable hepatosplenomegaly Extremity: no pedal edema PSYCH: alert & oriented x 3 with fluent speech NEURO: no focal motor/sensory deficits  LABORATORY DATA:  I have reviewed the data as listed  .    Latest Ref Rng & Units 08/21/2021   12:46 PM 01/31/2021    3:26 PM 05/03/2020    9:35 AM  CBC  WBC 4.0 - 10.5 K/uL 11.4  11.2  9.5   Hemoglobin 13.0 - 17.0 g/dL 9.7  10.9  10.4   Hematocrit 39.0 - 52.0 % 29.7  33.1  32.3   Platelets 150 - 400 K/uL 142  148  175     .    Latest Ref Rng & Units 08/21/2021   12:46 PM 01/31/2021    3:26 PM 10/04/2020   10:08 AM  CMP  Glucose 70 - 99 mg/dL 104  97  100   BUN 8 - 23 mg/dL _0 Creatinine 0.61 - 1.24 mg/dL 0.86  0.83  0.86   Sodium 135 - 145 mmol/L 140  141  142   Potassium 3.5 - 5.1 mmol/L 3.8  4.8  4.4   Chloride 98 - 111 mmol/L 104  104  105   CO2 22 - 32 mmol/L _1 Calcium 8.9 - 10.3 mg/dL 9.6  9.3  9.2   Total Protein 6.5 - 8.1 g/dL 6.8     Total Bilirubin 0.3 - 1.2 mg/dL 0.8     Alkaline Phos 38 - 126 U/L 31     AST 15 - 41 U/L 16     ALT 0 - 44 U/L 14        RADIOGRAPHIC STUDIES: I have personally reviewed the radiological images as listed and agreed with the findings in the report. CUP PACEART REMOTE DEVICE CHECK  Result Date: 08/20/2021 Scheduled remote reviewed. Normal device function.  OAC- Warfarin Next remote 91 days- JJB  [place] CORE BIOPSY (SALIVARY GLAND/PAROTID GLAND)  Result Date: 07/25/2021 INDICATION: Cervical lymphadenopathy EXAM: Ultrasound-guided left neck lymph node biopsy MEDICATIONS: None. ANESTHESIA/SEDATION: None COMPLICATIONS: None immediate. PROCEDURE: Informed written  consent was obtained from the patient after a thorough discussion of the procedural risks, benefits and alternatives. All questions were addressed.  Maximal Sterile Barrier Technique was utilized including caps, mask, sterile gowns, sterile gloves, sterile drape, hand hygiene and skin antiseptic. A timeout was performed prior to the initiation of the procedure. Patient positioned supine on the procedure table. Left neck prepped and draped usual fashion. Utilizing continuous ultrasound guidance, 6-18 gauge cores were obtained from 1 of the enlarged left neck lymph nodes. All samples were sent to pathology in formalin. Patient of the procedure well without immediate complication. IMPRESSION: Ultrasound-guided biopsy of enlarged left neck lymph node. Electronically Signed   By: Miachel Roux M.D.   On: 07/25/2021 16:33    ASSESSMENT & PLAN:   88 year old male with multiple medical comorbidities as noted above with  #1 newly diagnosed Small lymphocytic lymphoma/chronic lymphocytic leukemia Plan I discussed the patient's pathology results and new diagnosis of CLL/SLL in details including diagnosis, prognosis, natural history, treatment considerations, potential complications and treatment options. All of the patient's and his family's questions were answered in details. Additional reading material was provided to the patient about this condition for his education. We discussed and he does not have any current constitutional symptoms such as fevers chills night sweats significant weight loss or unexpected and extensive fatigue at this time. We shall get a PET CT scan prior to his follow-up visit in a few months for initial staging Patient will get CLL FISH panel with upcoming labs on follow-up His mild anemia is normocytic and we shall get additional anemia work-up on repeat labs No obvious indication for treatment of the patient's newly diagnosed CLL/SLL at this time.  .Labs today PET/CT in 14 weeks RTC with Dr Irene Limbo with labs in 16 weeks  Orders Placed This Encounter  Procedures   CBC with Differential ([place] Only)    Standing Status:    Future    Number of Occurrences:   1    Standing Expiration Date:   08/22/2022   CMP ([place] only)    Standing Status:   Future    Number of Occurrences:   1    Standing Expiration Date:   08/22/2022   Lactate dehydrogenase    Standing Status:   Future    Number of Occurrences:   1    Standing Expiration Date:   08/22/2022   Hepatitis C antibody    Standing Status:   Future    Number of Occurrences:   1    Standing Expiration Date:   08/22/2022   ABO/RH    Standing Status:   Future    Number of Occurrences:   1    Standing Expiration Date:   08/21/2022   The total time spent in the appointment was 60 minutes*.  All of the patient's questions were answered with apparent satisfaction. The patient knows to call the clinic with any problems, questions or concerns.   Sullivan Lone MD MS AAHIVMS [facility] Hematology/Oncology Physician [facility]  .*Total Encounter Time as defined by the Centers for Medicare and Medicaid Services includes, in addition to the face-to-face time of a patient visit (documented in the note above) non-face-to-face time: obtaining and reviewing outside history, ordering and reviewing medications, tests or procedures, care coordination (communications with other health care professionals or caregivers) and documentation in the medical record.   08/21/2021 11:54 AM    '

## 2021-08-22 DIAGNOSIS — J019 Acute sinusitis, unspecified: Secondary | ICD-10-CM | POA: Diagnosis not present

## 2021-08-22 DIAGNOSIS — R059 Cough, unspecified: Secondary | ICD-10-CM | POA: Diagnosis not present

## 2021-08-22 DIAGNOSIS — J189 Pneumonia, unspecified organism: Secondary | ICD-10-CM | POA: Diagnosis not present

## 2021-08-22 DIAGNOSIS — R0981 Nasal congestion: Secondary | ICD-10-CM | POA: Diagnosis not present

## 2021-08-23 ENCOUNTER — Telehealth: Payer: Self-pay | Admitting: Hematology

## 2021-08-26 DIAGNOSIS — J189 Pneumonia, unspecified organism: Secondary | ICD-10-CM | POA: Diagnosis not present

## 2021-08-26 DIAGNOSIS — R059 Cough, unspecified: Secondary | ICD-10-CM | POA: Diagnosis not present

## 2021-09-03 ENCOUNTER — Other Ambulatory Visit: Payer: Self-pay | Admitting: Internal Medicine

## 2021-09-03 DIAGNOSIS — I480 Paroxysmal atrial fibrillation: Secondary | ICD-10-CM

## 2021-09-05 NOTE — Progress Notes (Signed)
Remote pacemaker transmission.   

## 2021-09-22 ENCOUNTER — Other Ambulatory Visit: Payer: Self-pay | Admitting: Internal Medicine

## 2021-10-01 ENCOUNTER — Ambulatory Visit (INDEPENDENT_AMBULATORY_CARE_PROVIDER_SITE_OTHER): Payer: Medicare Other

## 2021-10-01 DIAGNOSIS — Z5181 Encounter for therapeutic drug level monitoring: Secondary | ICD-10-CM

## 2021-10-01 DIAGNOSIS — I4819 Other persistent atrial fibrillation: Secondary | ICD-10-CM

## 2021-10-01 DIAGNOSIS — I480 Paroxysmal atrial fibrillation: Secondary | ICD-10-CM | POA: Diagnosis not present

## 2021-10-01 LAB — POCT INR: INR: 2.3 (ref 2.0–3.0)

## 2021-10-01 NOTE — Patient Instructions (Signed)
Description   Continue warfarin 1 tablet daily.  INR check 6 weeks Call if scheduled for any procedures or has any new medications  [phone]

## 2021-10-08 ENCOUNTER — Other Ambulatory Visit: Payer: Self-pay

## 2021-10-08 MED ORDER — DIGOXIN 125 MCG PO TABS: 125.0000 ug | ORAL_TABLET | Freq: Every day | ORAL | 0 refills | Status: DC

## 2021-10-11 ENCOUNTER — Encounter: Payer: Self-pay | Admitting: Physician Assistant

## 2021-10-11 ENCOUNTER — Ambulatory Visit (INDEPENDENT_AMBULATORY_CARE_PROVIDER_SITE_OTHER): Payer: Medicare Other | Admitting: Physician Assistant

## 2021-10-11 VITALS — BP 130/60 | HR 68 | Ht 69.0 in | Wt 169.0 lb

## 2021-10-11 DIAGNOSIS — I4821 Permanent atrial fibrillation: Secondary | ICD-10-CM | POA: Diagnosis not present

## 2021-10-11 DIAGNOSIS — Z79899 Other long term (current) drug therapy: Secondary | ICD-10-CM | POA: Diagnosis not present

## 2021-10-11 DIAGNOSIS — Z952 Presence of prosthetic heart valve: Secondary | ICD-10-CM

## 2021-10-11 DIAGNOSIS — Z950 Presence of cardiac pacemaker: Secondary | ICD-10-CM | POA: Diagnosis not present

## 2021-10-11 DIAGNOSIS — I5032 Chronic diastolic (congestive) heart failure: Secondary | ICD-10-CM

## 2021-10-11 DIAGNOSIS — I10 Essential (primary) hypertension: Secondary | ICD-10-CM | POA: Diagnosis not present

## 2021-10-11 DIAGNOSIS — I2510 Atherosclerotic heart disease of native coronary artery without angina pectoris: Secondary | ICD-10-CM | POA: Diagnosis not present

## 2021-10-11 LAB — CUP PACEART INCLINIC DEVICE CHECK
Battery Remaining Longevity: 122 mo
Battery Voltage: 3.02 V
Brady Statistic RA Percent Paced: 0 %
Brady Statistic RV Percent Paced: 14 %
Date Time Interrogation Session: 20230811132811
Implantable Lead Implant Date: 20220321
Implantable Lead Implant Date: 20220321
Implantable Lead Location: 753859
Implantable Lead Location: 753860
Implantable Pulse Generator Implant Date: 20220321
Lead Channel Impedance Value: 425 Ohm
Lead Channel Impedance Value: 512.5 Ohm
Lead Channel Pacing Threshold Amplitude: 0.75 V
Lead Channel Pacing Threshold Amplitude: 0.75 V
Lead Channel Pacing Threshold Pulse Width: 0.4 ms
Lead Channel Pacing Threshold Pulse Width: 0.4 ms
Lead Channel Sensing Intrinsic Amplitude: 0.9 mV
Lead Channel Sensing Intrinsic Amplitude: 11 mV
Lead Channel Setting Pacing Amplitude: 2.5 V
Lead Channel Setting Pacing Pulse Width: 0.4 ms
Lead Channel Setting Sensing Sensitivity: 2 mV
Pulse Gen Model: 2272
Pulse Gen Serial Number: 3909458

## 2021-10-11 MED ORDER — DIGOXIN 125 MCG PO TABS: 125.0000 ug | ORAL_TABLET | Freq: Every day | ORAL | 3 refills | Status: DC

## 2021-10-11 NOTE — Progress Notes (Signed)
Cardiology Office Note Date:  10/11/2021  Patient ID:  Robert [name], Robert [name] 07/31/1927, MRN [id number] PCP:  Josetta Huddle, MD  Cardiologist:  Dr. Irish Lack Electrophysiologist: Dr. Lovena Le    Chief Complaint: 6 mo  History of Present Illness: JIE STICKELS is a 88 y.o. male with history of VHD w/AVR (bioprosthetic, 2015), CAD (PCI LCx 2011, CABG 2015), GERD, HN, HLD, Afib (MAZE and LAA clip 2015), tachy-brady w/PPM.  He comes in today to be seen for Dr. Lovena Le, last seen by him July 2022, he was feeling well, in Afib w/RVR and dig was added to his regime.  Felt to be volume OL and started on lasix.  Has followed with Dr. Oval Linsey since, most recently March 2023, doing fairly well, SOB  with activity, still getting out for yard work, limited by back/leg pain. Planned to update his echo with mod MR on his last.  Echo was stable, LVEF 55-60%, mod asymmetric LVH, RV OK, LA/RA severely dilated, AVR functioning well, no MR  TODAY He is accompanied by his wife today He is doing well No bleeding or signs of bleeding on the warfarin. No CP, palpitations or cardiac awareness. Denies SOB No near syncope or syncope.  His only mention today is orthopedic aches/pains, ambulating with a cane   Device information Abbott dual chamber PPM implanted 05/21/2020   Past Medical History:  Diagnosis Date   Aortic stenosis    a. s/p tissue AVR 05/2013;  b. Echo (06/2013):  Mod LVH, EF 60-65%, no RWMA, Gr 2 DD, AVR ok (mean 13 mmHg), MAC, mild MR, mod LAE, mild RAE, PASP 46 mmHg (mild pulmo HTN)   Blood loss anemia    a. 05/2009 a/w GIB.   CAD (coronary artery disease)    a. Cath 04/2009: BMS to LCx 04/2009; CTO of RCA with L-R collaterals, 40% ostial diag.   Carpal tunnel syndrome    bilateral, carpal tunnel release x 2   Esophageal ulcer    a. 05/2009 with hemorrhage - injected with epinephrine-Dr. Herbie Baltimore Buccini.   Esophagitis    a. 05/2009 a/w GIB.   GERD (gastroesophageal reflux  disease)    EGD, Dr. Toney Rakes 2008   GI bleed    a. 05/2009 as above: with associated anemia. Lower esophageal ulcer with extensive erosive esophagitis and large clot by EGD 05/2009.    Hx of colonoscopy 2008   Dr. Toney Rakes, polyps- 5 yr f/u recommended   Hyperlipidemia    Hypertension    PAF (paroxysmal atrial fibrillation) ([place])    a. Post-op AVR;  b. 04/2014 recurrent.   Perforated ear drum    right ear drum, tube in left ear drum, Dr. Constance Holster   Polymyalgia rheumatica [facility])    Prostate cancer [facility])    Vitamin D deficiency     Past Surgical History:  Procedure Laterality Date   AORTIC VALVE REPLACEMENT N/A 05/12/2013   Procedure: AORTIC VALVE REPLACEMENT (AVR);  Surgeon: Ivin Poot, MD;  Location: [place];  Service: Open Heart Surgery;  Laterality: N/A;   CARDIAC CATHETERIZATION  2011   CARDIOVERSION N/A 04/12/2020   Procedure: CARDIOVERSION;  Surgeon: Dorothy Spark, MD;  Location: [place];  Service: Cardiovascular;  Laterality: N/A;   CORONARY ARTERY BYPASS GRAFT N/A 05/12/2013   Procedure: CORONARY ARTERY BYPASS GRAFTING (CABG);  Surgeon: Ivin Poot, MD;  Location: [place];  Service: Open Heart Surgery;  Laterality: N/A;  CABG x 1 using left leg greater saphenous vein harvested endoscopically  INTRAOPERATIVE TRANSESOPHAGEAL ECHOCARDIOGRAM N/A 05/12/2013   Procedure: INTRAOPERATIVE TRANSESOPHAGEAL ECHOCARDIOGRAM;  Surgeon: Ivin Poot, MD;  Location: [place];  Service: Open Heart Surgery;  Laterality: N/A;   LEFT AND RIGHT HEART CATHETERIZATION WITH CORONARY ANGIOGRAM N/A 05/09/2013   Procedure: LEFT AND RIGHT HEART CATHETERIZATION WITH CORONARY ANGIOGRAM;  Surgeon: Lorretta Harp, MD;  Location: [facility] CATH LAB;  Service: Cardiovascular;  Laterality: N/A;   MAZE N/A 05/12/2013   Procedure: MAZE;  Surgeon: Ivin Poot, MD;  Location: [place];  Service: Open Heart Surgery;  Laterality: N/A;   PACEMAKER IMPLANT N/A 05/21/2020   Procedure: PACEMAKER IMPLANT;   Surgeon: Evans Lance, MD;  Location: [place] CV LAB;  Service: Cardiovascular;  Laterality: N/A;   PROSTATECTOMY     TEE WITHOUT CARDIOVERSION N/A 04/12/2020   Procedure: TRANSESOPHAGEAL ECHOCARDIOGRAM (TEE);  Surgeon: Dorothy Spark, MD;  Location[facility] ENDOSCOPY;  Service: Cardiovascular;  Laterality: N/A;    Current Outpatient Medications  Medication Sig Dispense Refill   acetaminophen (TYLENOL) 325 MG tablet Take 650 mg by mouth every 6 (six) hours as needed for moderate pain.     Ascorbic Acid (VITAMIN C PO) Take 1 tablet by mouth 2 (two) times a week.     Cholecalciferol (DIALYVITE VITAMIN D 5000) 125 MCG (5000 UT) capsule Take 5,000 Units by mouth 2 (two) times a week.     furosemide (LASIX) 40 MG tablet Take 1 tablet (40 mg total) by mouth daily. 90 tablet 3   KLOR-CON M20 20 MEQ tablet Take 20 mEq by mouth daily.     losartan (COZAAR) 50 MG tablet Take 1 tablet (50 mg total) by mouth daily. 90 tablet 3   metoprolol succinate (TOPROL-XL) 50 MG 24 hr tablet Take 1 tablet (50 mg total) by mouth in the morning and at bedtime. 180 tablet 3   omeprazole (PRILOSEC) 20 MG capsule Take 20 mg by mouth every morning.     rosuvastatin (CRESTOR) 20 MG tablet TAKE 1 TABLET DAILY 90 tablet 3   warfarin (JANTOVEN) 5 MG tablet TAKE 1 TABLET BY MOUTH DAILY AS DIRECTED BY THE COUMADIN CLINIC 100 tablet 3   digoxin (LANOXIN) 0.125 MG tablet Take 1 tablet (125 mcg total) by mouth daily. 90 tablet 3   No current facility-administered medications for this visit.    Allergies:   Amoxicillin, Lisinopril, and Kenalog [triamcinolone]   Social History:  The patient  reports that he has never smoked. He has never used smokeless tobacco. He reports that he does not drink alcohol and does not use drugs.   Family History:  The patient's family history includes Breast cancer in his daughter; CVA in his mother; Kidney disease in his father.  ROS:  Please see the history of present illness.    All other  systems are reviewed and otherwise negative.   PHYSICAL EXAM:  VS:  BP 130/60   Pulse 68   Ht _0  (1.753 m)   Wt 169 lb (76.7 kg)   SpO2 98%   BMI 24.96 kg/m  BMI: Body mass index is 24.96 kg/m. Well nourished, well developed, in no acute distress HEENT: normocephalic, atraumatic Neck: no JVD, carotid bruits or masses Cardiac:  irreg-irreg; no significant murmurs, no rubs, or gallops Lungs:  CTA b/l, no wheezing, rhonchi or rales Abd: soft, nontender MS: no deformity, age appropriate atrophy Ext: no edema Skin: warm and dry, no rash Neuro:  No gross deficits appreciated Psych: euthymic mood, full affect  PPM site  is stable, no tethering or discomfort   EKG:  done today and reviewed by myself AFib 68bpm, RBBB  Device interrogation done today and reviewed by myself:  Battery and lead measurements are good 100% AFib for a year Programmed VVI 60  05/28/2021: TTE  1. Left ventricular ejection fraction, by estimation, is 55 to 60%. The  left ventricle has normal function. The left ventricle has no regional  wall motion abnormalities. There is moderate asymmetric left ventricular  hypertrophy of the basal segment.  Left ventricular diastolic parameters are indeterminate.   2. Right ventricular systolic function is normal. The right ventricular  size is normal. There is moderately elevated pulmonary artery systolic  pressure.   3. Left atrial size was severely dilated.   4. Right atrial size was severely dilated.   5. No evidence of mitral valve regurgitation. Moderate mitral annular  calcification.   6. Tricuspid valve regurgitation is mild to moderate.   7. 23 mm Magna Ease Pericardial Valve. Mean AV gradient 10.2 mmHg. DI  0.39. Well seated. Procedure date 2015. No paravalvular leak. Normal  prosthesis. The aortic valve is grossly normal. Aortic valve regurgitation  is not visualized.   8. Aortic small ascending aortic aneurysm 4.1 cm.   9. The inferior vena cava is  dilated in size with >50% respiratory  variability, suggesting right atrial pressure of 8 mmHg.   Conclusion(s)/Recommendation(s): No significant changes from TEE  04/12/2020.   Recent Labs: 01/31/2021: BNP 458.3 08/21/2021: ALT 14; BUN 14; Creatinine 0.86; Hemoglobin 9.7; Platelet Count 142; Potassium 3.8; Sodium 140  No results found for requested labs within last 365 days.   CrCl cannot be calculated (Patient's most recent lab result is older than the maximum 21 days allowed.).   Wt Readings from Last 3 Encounters:  10/11/21 169 lb (76.7 kg)  08/21/21 172 lb 8 oz (78.2 kg)  07/25/21 169 lb (76.7 kg)     Other studies reviewed: Additional studies/records reviewed today include: summarized above  ASSESSMENT AND PLAN:  PPM Programmed VVI 60 with 100% AFib  Permanent Afib CHA2DS2Vasc is 4, on warfarin controlled rates Dig level today, he reports dig is a PM medicine for him  CAD (CABG) No anginal symptoms On BB, statin, no ASA with warfarin C/w Dr. Irish Lack  VHD, s/p AVR Bioprosthetic Functioning well No murmur on exam C/w Dr. Irish Lack  HTN Looks good No changes  Chronic CHF HFpEF No symptoms or exam findings of volume OL C/w Dr. Irish Lack  Disposition: F/u with remotes as usual, in clinic with EP in a year, sooner if needed  Current medicines are reviewed at length with the patient today.  The patient did not have any concerns regarding medicines.  Venetia Night, PA-C 10/11/2021 12:51 PM     [place] [place] [address] [place] [address] [phone] (office)  [phone] (fax)

## 2021-10-11 NOTE — Patient Instructions (Signed)
Medication Instructions:   Your physician recommends that you continue on your current medications as directed. Please refer to the Current Medication list given to you today.  *If you need a refill on your cardiac medications before your next appointment, please call your pharmacy*   Lab Work: DIGOXIN LEVEL   If you have labs (blood work) drawn today and your tests are completely normal, you will receive your results only by: [place] (if you have MyChart) OR A paper copy in the mail If you have any lab test that is abnormal or we need to change your treatment, we will call you to review the results.   Testing/Procedures: NONE ORDERED  TODAY   Follow-Up: At [facility], you and your health needs are our priority.  As part of our continuing mission to provide you with exceptional heart care, we have created designated Provider Care Teams.  These Care Teams include your primary Cardiologist (physician) and Advanced Practice Providers (APPs -  Physician Assistants and Nurse Practitioners) who all work together to provide you with the care you need, when you need it.  We recommend signing up for the patient portal called "MyChart".  Sign up information is provided on this After Visit Summary.  MyChart is used to connect with patients for Virtual Visits (Telemedicine).  Patients are able to view lab/test results, encounter notes, upcoming appointments, etc.  Non-urgent messages can be sent to your provider as well.   To learn more about what you can do with MyChart, go to [email or web address].    Your next appointment:   1 year(s)  The format for your next appointment:   In Person  Provider:   You may see Dr. Lovena Le  or one of the following Advanced Practice Providers on your designated Care Team:   Tommye Standard, [place] Legrand Como "Jonni Sanger" Chalmers Cater, PA-C{     Other Instructions   Important Information About Sugar

## 2021-10-12 LAB — DIGOXIN LEVEL: Digoxin, Serum: 0.8 ng/mL (ref 0.5–0.9)

## 2021-10-18 ENCOUNTER — Other Ambulatory Visit: Payer: Self-pay

## 2021-10-18 DIAGNOSIS — C9110 Chronic lymphocytic leukemia of B-cell type not having achieved remission: Secondary | ICD-10-CM

## 2021-10-21 DIAGNOSIS — R490 Dysphonia: Secondary | ICD-10-CM | POA: Diagnosis not present

## 2021-10-21 DIAGNOSIS — Z0000 Encounter for general adult medical examination without abnormal findings: Secondary | ICD-10-CM | POA: Diagnosis not present

## 2021-10-21 DIAGNOSIS — Z1331 Encounter for screening for depression: Secondary | ICD-10-CM | POA: Diagnosis not present

## 2021-10-21 DIAGNOSIS — I2510 Atherosclerotic heart disease of native coronary artery without angina pectoris: Secondary | ICD-10-CM | POA: Diagnosis not present

## 2021-10-21 DIAGNOSIS — C8590 Non-Hodgkin lymphoma, unspecified, unspecified site: Secondary | ICD-10-CM | POA: Diagnosis not present

## 2021-10-21 DIAGNOSIS — D6869 Other thrombophilia: Secondary | ICD-10-CM | POA: Diagnosis not present

## 2021-10-21 DIAGNOSIS — I4891 Unspecified atrial fibrillation: Secondary | ICD-10-CM | POA: Diagnosis not present

## 2021-10-21 DIAGNOSIS — I10 Essential (primary) hypertension: Secondary | ICD-10-CM | POA: Diagnosis not present

## 2021-10-21 DIAGNOSIS — E785 Hyperlipidemia, unspecified: Secondary | ICD-10-CM | POA: Diagnosis not present

## 2021-10-21 DIAGNOSIS — M419 Scoliosis, unspecified: Secondary | ICD-10-CM | POA: Diagnosis not present

## 2021-10-21 DIAGNOSIS — E559 Vitamin D deficiency, unspecified: Secondary | ICD-10-CM | POA: Diagnosis not present

## 2021-10-21 DIAGNOSIS — I890 Lymphedema, not elsewhere classified: Secondary | ICD-10-CM | POA: Diagnosis not present

## 2021-10-21 DIAGNOSIS — D500 Iron deficiency anemia secondary to blood loss (chronic): Secondary | ICD-10-CM | POA: Diagnosis not present

## 2021-10-21 DIAGNOSIS — M353 Polymyalgia rheumatica: Secondary | ICD-10-CM | POA: Diagnosis not present

## 2021-11-12 ENCOUNTER — Ambulatory Visit: Payer: Medicare Other | Attending: Cardiology

## 2021-11-12 DIAGNOSIS — I4819 Other persistent atrial fibrillation: Secondary | ICD-10-CM | POA: Diagnosis not present

## 2021-11-12 DIAGNOSIS — Z5181 Encounter for therapeutic drug level monitoring: Secondary | ICD-10-CM | POA: Diagnosis not present

## 2021-11-12 DIAGNOSIS — I480 Paroxysmal atrial fibrillation: Secondary | ICD-10-CM | POA: Diagnosis not present

## 2021-11-12 LAB — POCT INR: INR: 3 (ref 2.0–3.0)

## 2021-11-12 NOTE — Patient Instructions (Signed)
Description   Continue warfarin 1 tablet daily.  INR check 6 weeks Call if scheduled for any procedures or has any new medications  [phone]

## 2021-11-19 ENCOUNTER — Ambulatory Visit (INDEPENDENT_AMBULATORY_CARE_PROVIDER_SITE_OTHER): Payer: Medicare Other

## 2021-11-19 DIAGNOSIS — I495 Sick sinus syndrome: Secondary | ICD-10-CM | POA: Diagnosis not present

## 2021-11-19 LAB — CUP PACEART REMOTE DEVICE CHECK
Battery Remaining Longevity: 118 mo
Battery Remaining Percentage: 89 %
Battery Voltage: 3.04 V
Brady Statistic RV Percent Paced: 18 %
Date Time Interrogation Session: 20230919020014
Implantable Lead Implant Date: 20220321
Implantable Lead Implant Date: 20220321
Implantable Lead Location: 753859
Implantable Lead Location: 753860
Implantable Pulse Generator Implant Date: 20220321
Lead Channel Impedance Value: 490 Ohm
Lead Channel Pacing Threshold Amplitude: 0.75 V
Lead Channel Pacing Threshold Pulse Width: 0.4 ms
Lead Channel Sensing Intrinsic Amplitude: 10.8 mV
Lead Channel Setting Pacing Amplitude: 2.5 V
Lead Channel Setting Pacing Pulse Width: 0.4 ms
Lead Channel Setting Sensing Sensitivity: 2 mV
Pulse Gen Model: 2272
Pulse Gen Serial Number: 3909458

## 2021-11-21 ENCOUNTER — Other Ambulatory Visit: Payer: Self-pay | Admitting: Internal Medicine

## 2021-11-29 ENCOUNTER — Ambulatory Visit (HOSPITAL_COMMUNITY)
Admission: RE | Admit: 2021-11-29 | Discharge: 2021-11-29 | Disposition: A | Discharge status: Home / Self Care | Payer: Medicare Other | Source: Ambulatory Visit | Attending: Hematology | Admitting: Hematology

## 2021-11-29 DIAGNOSIS — C8590 Non-Hodgkin lymphoma, unspecified, unspecified site: Secondary | ICD-10-CM | POA: Diagnosis not present

## 2021-11-29 DIAGNOSIS — C9110 Chronic lymphocytic leukemia of B-cell type not having achieved remission: Secondary | ICD-10-CM | POA: Diagnosis not present

## 2021-11-29 LAB — GLUCOSE, CAPILLARY: Glucose-Capillary: 81 mg/dL (ref 70–99)

## 2021-11-29 MED ORDER — FLUDEOXYGLUCOSE F - 18 (FDG) INJECTION
8.4000 | Freq: Once | INTRAVENOUS | Status: AC
  Administered 2021-11-29: 8.4 via INTRAVENOUS

## 2021-12-03 NOTE — Progress Notes (Signed)
Remote pacemaker transmission.   

## 2021-12-10 ENCOUNTER — Other Ambulatory Visit: Payer: Self-pay

## 2021-12-10 DIAGNOSIS — C9110 Chronic lymphocytic leukemia of B-cell type not having achieved remission: Secondary | ICD-10-CM

## 2021-12-11 ENCOUNTER — Other Ambulatory Visit: Payer: Self-pay

## 2021-12-11 ENCOUNTER — Inpatient Hospital Stay: Payer: Medicare Other

## 2021-12-11 ENCOUNTER — Inpatient Hospital Stay: Payer: Medicare Other | Attending: Hematology | Admitting: Hematology

## 2021-12-11 VITALS — BP 135/78 | HR 87 | Temp 99.1°F | Resp 18 | Ht 69.0 in | Wt 170.8 lb

## 2021-12-11 DIAGNOSIS — C9110 Chronic lymphocytic leukemia of B-cell type not having achieved remission: Secondary | ICD-10-CM | POA: Diagnosis not present

## 2021-12-11 DIAGNOSIS — Z7901 Long term (current) use of anticoagulants: Secondary | ICD-10-CM | POA: Insufficient documentation

## 2021-12-11 DIAGNOSIS — I350 Nonrheumatic aortic (valve) stenosis: Secondary | ICD-10-CM | POA: Insufficient documentation

## 2021-12-11 DIAGNOSIS — I2510 Atherosclerotic heart disease of native coronary artery without angina pectoris: Secondary | ICD-10-CM | POA: Diagnosis not present

## 2021-12-11 DIAGNOSIS — C61 Malignant neoplasm of prostate: Secondary | ICD-10-CM | POA: Diagnosis not present

## 2021-12-11 DIAGNOSIS — Z1501 Genetic susceptibility to malignant neoplasm of breast: Secondary | ICD-10-CM | POA: Diagnosis not present

## 2021-12-11 DIAGNOSIS — M353 Polymyalgia rheumatica: Secondary | ICD-10-CM | POA: Insufficient documentation

## 2021-12-11 DIAGNOSIS — I10 Essential (primary) hypertension: Secondary | ICD-10-CM | POA: Diagnosis not present

## 2021-12-11 DIAGNOSIS — Z79899 Other long term (current) drug therapy: Secondary | ICD-10-CM | POA: Diagnosis not present

## 2021-12-11 DIAGNOSIS — I480 Paroxysmal atrial fibrillation: Secondary | ICD-10-CM | POA: Diagnosis not present

## 2021-12-11 DIAGNOSIS — E785 Hyperlipidemia, unspecified: Secondary | ICD-10-CM | POA: Insufficient documentation

## 2021-12-11 LAB — CBC WITH DIFFERENTIAL (CANCER CENTER ONLY)
Abs Immature Granulocytes: 0.07 10*3/uL (ref 0.00–0.07)
Basophils Absolute: 0.1 10*3/uL (ref 0.0–0.1)
Basophils Relative: 1 %
Eosinophils Absolute: 0.5 10*3/uL (ref 0.0–0.5)
Eosinophils Relative: 4 %
HCT: 29.8 % — ABNORMAL LOW (ref 39.0–52.0)
Hemoglobin: 9.8 g/dL — ABNORMAL LOW (ref 13.0–17.0)
Immature Granulocytes: 1 %
Lymphocytes Relative: 40 %
Lymphs Abs: 5 10*3/uL — ABNORMAL HIGH (ref 0.7–4.0)
MCH: 30.9 pg (ref 26.0–34.0)
MCHC: 32.9 g/dL (ref 30.0–36.0)
MCV: 94 fL (ref 80.0–100.0)
Monocytes Absolute: 0.9 10*3/uL (ref 0.1–1.0)
Monocytes Relative: 7 %
Neutro Abs: 5.9 10*3/uL (ref 1.7–7.7)
Neutrophils Relative %: 47 %
Platelet Count: 174 10*3/uL (ref 150–400)
RBC: 3.17 MIL/uL — ABNORMAL LOW (ref 4.22–5.81)
RDW: 15.1 % (ref 11.5–15.5)
WBC Count: 12.4 10*3/uL — ABNORMAL HIGH (ref 4.0–10.5)
nRBC: 0 % (ref 0.0–0.2)

## 2021-12-11 LAB — CMP (CANCER CENTER ONLY)
ALT: 10 U/L (ref 0–44)
AST: 14 U/L — ABNORMAL LOW (ref 15–41)
Albumin: 4.2 g/dL (ref 3.5–5.0)
Alkaline Phosphatase: 31 U/L — ABNORMAL LOW (ref 38–126)
Anion gap: 7 (ref 5–15)
BUN: 18 mg/dL (ref 8–23)
CO2: 27 mmol/L (ref 22–32)
Calcium: 9 mg/dL (ref 8.9–10.3)
Chloride: 107 mmol/L (ref 98–111)
Creatinine: 0.85 mg/dL (ref 0.61–1.24)
GFR, Estimated: >60 mL/min (ref >60)
Glucose, Bld: 105 mg/dL — ABNORMAL HIGH (ref 70–99)
Potassium: 4.3 mmol/L (ref 3.5–5.1)
Sodium: 141 mmol/L (ref 135–145)
Total Bilirubin: 0.7 mg/dL (ref 0.3–1.2)
Total Protein: 6.4 g/dL — ABNORMAL LOW (ref 6.5–8.1)

## 2021-12-11 LAB — LACTATE DEHYDROGENASE: LDH: 164 U/L (ref 98–192)

## 2021-12-11 NOTE — Progress Notes (Signed)
[name]   HEMATOLOGY/ONCOLOGY CONSULTATION NOTE  Date of Service: 12/11/2021  Patient Care Team: Josetta Huddle, MD as PCP - General (Internal Medicine) Jettie Booze, MD as PCP - Cardiology (Cardiology)  CHIEF COMPLAINTS/PURPOSE OF CONSULTATION:  F/u for small lymphocytic lmyphoma  HISTORY OF PRESENTING ILLNESS:   Robert [name] is a wonderful 88 y.o. male who has been referred to [place] by Dr .Inda Merlin, Herbie Baltimore, MD/Jeffrey Constance Holster, MD for evaluation and management of small lymphocytic lymphoma.  He was last seen by me on 08/21/2021 and was doing well.   He is here with his daughter and his wife and he is doing well overall. His daughter states that he has fluid in his ears which has affected his hearing aids and causing him hearing problems. He denies fever, chill, new infection, changes in breathing, abdominal pain, and back pain.  He notes he has good appetite with steady weight.   His wife reports he occasionally has night sweat, which has been going on for a long time and has noting to do with CLL. He reports that he his having occasional leg pain as well. He notes that his legs swell up at night, but not during the day.   His daughter reports that he is positive for germline brca2 mutation.  He reports he has prostate cancer, which is getting treated for it. He is receiving leuprolide every 6 months.    His family history only has prostate cancer.   He is planning on receiving influenza vaccine, RSV vaccine, and the COVID-19 booster.   MEDICAL HISTORY:  Past Medical History:  Diagnosis Date   Aortic stenosis    a. s/p tissue AVR 05/2013;  b. Echo (06/2013):  Mod LVH, EF 60-65%, no RWMA, Gr 2 DD, AVR ok (mean 13 mmHg), MAC, mild MR, mod LAE, mild RAE, PASP 46 mmHg (mild pulmo HTN)   Blood loss anemia    a. 05/2009 a/w GIB.   CAD (coronary artery disease)    a. Cath 04/2009: BMS to LCx 04/2009; CTO of RCA with L-R collaterals, 40% ostial diag.   Carpal tunnel syndrome     bilateral, carpal tunnel release x 2   Esophageal ulcer    a. 05/2009 with hemorrhage - injected with epinephrine-Dr. Herbie Baltimore Buccini.   Esophagitis    a. 05/2009 a/w GIB.   GERD (gastroesophageal reflux disease)    EGD, Dr. Toney Rakes 2008   GI bleed    a. 05/2009 as above: with associated anemia. Lower esophageal ulcer with extensive erosive esophagitis and large clot by EGD 05/2009.    Hx of colonoscopy 2008   Dr. Toney Rakes, polyps- 5 yr f/u recommended   Hyperlipidemia    Hypertension    PAF (paroxysmal atrial fibrillation) ([place])    a. Post-op AVR;  b. 04/2014 recurrent.   Perforated ear drum    right ear drum, tube in left ear drum, Dr. Constance Holster   Polymyalgia rheumatica [facility])    Prostate cancer [facility])    Vitamin D deficiency     SURGICAL HISTORY: Past Surgical History:  Procedure Laterality Date   AORTIC VALVE REPLACEMENT N/A 05/12/2013   Procedure: AORTIC VALVE REPLACEMENT (AVR);  Surgeon: Ivin Poot, MD;  Location: [place];  Service: Open Heart Surgery;  Laterality: N/A;   CARDIAC CATHETERIZATION  2011   CARDIOVERSION N/A 04/12/2020   Procedure: CARDIOVERSION;  Surgeon: Dorothy Spark, MD;  Location: [place];  Service: Cardiovascular;  Laterality: N/A;   CORONARY ARTERY BYPASS GRAFT N/A 05/12/2013  Procedure: CORONARY ARTERY BYPASS GRAFTING (CABG);  Surgeon: Ivin Poot, MD;  Location: [place];  Service: Open Heart Surgery;  Laterality: N/A;  CABG x 1 using left leg greater saphenous vein harvested endoscopically   INTRAOPERATIVE TRANSESOPHAGEAL ECHOCARDIOGRAM N/A 05/12/2013   Procedure: INTRAOPERATIVE TRANSESOPHAGEAL ECHOCARDIOGRAM;  Surgeon: Ivin Poot, MD;  Location: [place];  Service: Open Heart Surgery;  Laterality: N/A;   LEFT AND RIGHT HEART CATHETERIZATION WITH CORONARY ANGIOGRAM N/A 05/09/2013   Procedure: LEFT AND RIGHT HEART CATHETERIZATION WITH CORONARY ANGIOGRAM;  Surgeon: Lorretta Harp, MD;  Location: [facility] CATH LAB;  Service: Cardiovascular;   Laterality: N/A;   MAZE N/A 05/12/2013   Procedure: MAZE;  Surgeon: Ivin Poot, MD;  Location: [place];  Service: Open Heart Surgery;  Laterality: N/A;   PACEMAKER IMPLANT N/A 05/21/2020   Procedure: PACEMAKER IMPLANT;  Surgeon: Evans Lance, MD;  Location: [place] CV LAB;  Service: Cardiovascular;  Laterality: N/A;   PROSTATECTOMY     TEE WITHOUT CARDIOVERSION N/A 04/12/2020   Procedure: TRANSESOPHAGEAL ECHOCARDIOGRAM (TEE);  Surgeon: Dorothy Spark, MD;  Location: [facility] ENDOSCOPY;  Service: Cardiovascular;  Laterality: N/A;    SOCIAL HISTORY: Social History   Socioeconomic History   Marital status: Married    Spouse name: Not on file   Number of children: Not on file   Years of education: Not on file   Highest education level: Not on file  Occupational History   Not on file  Tobacco Use   Smoking status: Never   Smokeless tobacco: Never  Vaping Use   Vaping Use: Never used  Substance and Sexual Activity   Alcohol use: No   Drug use: No   Sexual activity: Not on file  Other Topics Concern   Not on file  Social History Narrative   Not on file   Social Determinants of Health   Financial Resource Strain: Not on file  Food Insecurity: Not on file  Transportation Needs: Not on file  Physical Activity: Not on file  Stress: Not on file  Social Connections: Not on file  Intimate Partner Violence: Not on file    FAMILY HISTORY: Family History  Problem Relation Age of Onset   CVA Mother    Kidney disease Father    Breast cancer Daughter     ALLERGIES:  is allergic to amoxicillin, lisinopril, and kenalog [triamcinolone].  MEDICATIONS:  Current Outpatient Medications  Medication Sig Dispense Refill   acetaminophen (TYLENOL) 325 MG tablet Take 650 mg by mouth every 6 (six) hours as needed for moderate pain.     Ascorbic Acid (VITAMIN C PO) Take 1 tablet by mouth 2 (two) times a week.     Cholecalciferol (DIALYVITE VITAMIN D 5000) 125 MCG (5000 UT) capsule Take  5,000 Units by mouth 2 (two) times a week.     digoxin (LANOXIN) 0.125 MG tablet Take 1 tablet (125 mcg total) by mouth daily. 90 tablet 3   furosemide (LASIX) 40 MG tablet TAKE 1 TABLET DAILY 90 tablet 3   KLOR-CON M20 20 MEQ tablet Take 20 mEq by mouth daily.     losartan (COZAAR) 50 MG tablet Take 1 tablet (50 mg total) by mouth daily. 90 tablet 3   metoprolol succinate (TOPROL-XL) 50 MG 24 hr tablet Take 1 tablet (50 mg total) by mouth in the morning and at bedtime. 180 tablet 3   omeprazole (PRILOSEC) 20 MG capsule Take 20 mg by mouth every morning.     rosuvastatin (  CRESTOR) 20 MG tablet TAKE 1 TABLET DAILY 90 tablet 3   warfarin (JANTOVEN) 5 MG tablet TAKE 1 TABLET BY MOUTH DAILY AS DIRECTED BY THE COUMADIN CLINIC 100 tablet 3   No current facility-administered medications for this visit.    REVIEW OF SYSTEMS:   10 Point review of Systems was done is negative except as noted above.  PHYSICAL EXAMINATION: ECOG PERFORMANCE STATUS: 1 - Symptomatic but completely ambulatory  . Vitals:   12/11/21 1352  BP: 135/78  Pulse: 87  Resp: 18  Temp: 99.1 F (37.3 C)  SpO2: 98%    Filed Weights   12/11/21 1352  Weight: 170 lb 12.8 oz (77.5 kg)    .Body mass index is 25.22 kg/m.  GENERAL:alert, in no acute distress and comfortable SKIN: no acute rashes, no significant lesions EYES: conjunctiva are pink and non-injected, sclera anicteric OROPHARYNX: MMM, no exudates, no oropharyngeal erythema or ulceration NECK: supple, no JVD LYMPH:  no palpable lymphadenopathy in the axillary or inguinal regions.  Palpable bilateral cervical lymph nodes left more than right LUNGS: clear to auscultation b/l with normal respiratory effort HEART: regular rate & rhythm ABDOMEN:  normoactive bowel sounds , non tender, not distended.  No palpable hepatosplenomegaly Extremity: no pedal edema PSYCH: alert & oriented x 3 with fluent speech NEURO: no focal motor/sensory deficits  LABORATORY DATA:  I  have reviewed the data as listed  .    Latest Ref Rng & Units 12/11/2021    1:00 PM 08/21/2021   12:46 PM 01/31/2021    3:26 PM  CBC  WBC 4.0 - 10.5 K/uL 12.4  11.4  11.2   Hemoglobin 13.0 - 17.0 g/dL 9.8  9.7  10.9   Hematocrit 39.0 - 52.0 % 29.8  29.7  33.1   Platelets 150 - 400 K/uL 174  142  148     .    Latest Ref Rng & Units 12/11/2021    1:00 PM 08/21/2021   12:46 PM 01/31/2021    3:26 PM  CMP  Glucose 70 - 99 mg/dL 105  104  97   BUN 8 - 23 mg/dL _0 Creatinine 0.61 - 1.24 mg/dL 0.85  0.86  0.83   Sodium 135 - 145 mmol/L 141  140  141   Potassium 3.5 - 5.1 mmol/L 4.3  3.8  4.8   Chloride 98 - 111 mmol/L 107  104  104   CO2 22 - 32 mmol/L _1 Calcium 8.9 - 10.3 mg/dL 9.0  9.6  9.3   Total Protein 6.5 - 8.1 g/dL 6.4  6.8    Total Bilirubin 0.3 - 1.2 mg/dL 0.7  0.8    Alkaline Phos 38 - 126 U/L 31  31    AST 15 - 41 U/L 14  16    ALT 0 - 44 U/L 10  14       RADIOGRAPHIC STUDIES: I have personally reviewed the radiological images as listed and agreed with the findings in the report. NM PET Image Initial (PI) Skull Base To Thigh  Result Date: 12/02/2021 CLINICAL DATA:  Initial treatment strategy for lymphoma. EXAM: NUCLEAR MEDICINE PET SKULL BASE TO THIGH TECHNIQUE: 8.4 mCi F-18 FDG was injected intravenously. Full-ring PET imaging was performed from the skull base to thigh after the radiotracer. CT data was obtained and used for attenuation correction and anatomic localization. Fasting blood glucose: 81 mg/dl COMPARISON:  CT neck 07/05/2021.  CT abdomen pelvis 03/27/2016. FINDINGS: Mediastinal blood pool activity: SUV max 1.9 Liver activity: SUV max 2.9 NECK: No hypermetabolic lymph nodes. Incidental CT findings: Lymph nodes are seen throughout the neck. Index left level 2 lymph node measures 1.3 cm (4/32), previously 1.7 cm. CHEST: Mediastinal and axillary lymph nodes generally show metabolism below blood pool. Low right paratracheal lymph node is  minimally hypermetabolic, measuring 1.9 cm (4/69), SUV max 2.8. Index right axillary lymph node measures 12 mm (4/63), SUV max 1.7. Mediastinal lymph nodes do appear somewhat decreased in size where visualized on 07/05/2021. Index mid right paratracheal lymph node measures 1.4 cm (4/65), previously 1.8 cm on 07/05/2021. Incidental CT findings: Atherosclerotic calcification of the aorta and coronary arteries. Aortic valve replacement. Heart is enlarged. No pericardial effusion. Small right pleural effusion. Mild bibasilar subsegmental volume loss. 4 mm left upper lobe nodule, too small to characterize. ABDOMEN/PELVIS: Mild hypermetabolism associated with morphologically normal adrenal glands, SUV max 4.2 on the left. No abnormal hypermetabolism in the liver, adrenal glands, spleen or pancreas. Scattered lymph nodes are hypermetabolic. Index periportal lymph node measures 1.8 cm (4/113), SUV max 1.7. Incidental CT findings: Liver is unremarkable. Cholecystectomy. Adrenal glands and kidneys are unremarkable. Spleen measures approximately 18.5 cm. Pancreas is unremarkable. Moderate hiatal hernia. Stomach and bowel are otherwise grossly unremarkable. Prostatectomy. Bladder is low in volume. Enlarged lymph nodes are seen in the abdomen and pelvis. Index aortocaval lymph node, 1.3 cm (4/130). Index left inguinal lymph node measures 1.6 cm (4/174). SKELETON: No abnormal hypermetabolism. Incidental CT findings: Degenerative changes in the spine. IMPRESSION: 1. Adenopathy throughout the neck, chest, abdomen and pelvis is generally hypometabolic. Single mildly hypermetabolic low right paratracheal lymph node. Findings are compatible with De[place]ville 2-3 disease. 2. Lymph nodes in the neck and chest, previously visualized on 07/05/2021, have decreased somewhat in size in the interval. 3. Splenomegaly. 4. Hypermetabolism within morphologically normal adrenal glands. Consider laboratory correlation. 5. Moderate hiatal hernia. 6.  Small right pleural effusion. 7. Aortic atherosclerosis (ICD10-I70.0). Coronary artery calcification. Electronically Signed   By: Lorin Picket M.D.   On: 12/02/2021 09:01   CUP PACEART REMOTE DEVICE CHECK  Result Date: 11/19/2021 Scheduled remote reviewed. Normal device function.  Next remote 9 days- JJB   ASSESSMENT & PLAN:   88 year old male with multiple medical comorbidities as noted above with:  #1 newly diagnosed Small lymphocytic lymphoma/chronic lymphocytic leukemia  Plan -Discussed labs from today with the patient, is daughter, and his wife. Lab showed WBC of 12.4 k, hemoglobin of 9.8 k, and platelets count of 174.  -All of the patient's and his family's questions were answered in details. -He is mildly anemic. -Reviewed PET scan with the patient, his wife, and his daugther. Spleen is enlarged at about 18.5 cm with no symtoms.  -His daughter reports that he is positive for germline brca2 mutation. -Will continue to follow up on CLL.  #2 h/o Prostate cancer -following with urology. On lupron q6 months  Follow-up: Phone visit in 4 months Labs 1-2 days prior to phone visit  The total time spent in the appointment was 25 minutes* .  All of the patient's questions were answered with apparent satisfaction. The patient knows to call the clinic with any problems, questions or concerns.   I,Param Shah,acting as a Education administrator for Sullivan Lone, MD.,have documented all relevant documentation on the behalf of Sullivan Lone, MD,as directed by  Sullivan Lone, MD while in the presence of Sullivan Lone, MD.   Sullivan Lone MD MS AAHIVMS  [facility] Hematology/Oncology Physician [place]  .*Total Encounter Time as defined by the Centers for Medicare and Medicaid Services includes, in addition to the face-to-face time of a patient visit (documented in the note above) non-face-to-face time: obtaining and reviewing outside history, ordering and reviewing medications, tests or  procedures, care coordination (communications with other health care professionals or caregivers) and documentation in the medical record.

## 2021-12-13 DIAGNOSIS — Z23 Encounter for immunization: Secondary | ICD-10-CM | POA: Diagnosis not present

## 2021-12-17 DIAGNOSIS — M810 Age-related osteoporosis without current pathological fracture: Secondary | ICD-10-CM | POA: Diagnosis not present

## 2021-12-17 DIAGNOSIS — R600 Localized edema: Secondary | ICD-10-CM | POA: Diagnosis not present

## 2021-12-17 DIAGNOSIS — C9110 Chronic lymphocytic leukemia of B-cell type not having achieved remission: Secondary | ICD-10-CM | POA: Diagnosis not present

## 2021-12-17 DIAGNOSIS — C61 Malignant neoplasm of prostate: Secondary | ICD-10-CM | POA: Diagnosis not present

## 2021-12-17 DIAGNOSIS — Z79899 Other long term (current) drug therapy: Secondary | ICD-10-CM | POA: Diagnosis not present

## 2021-12-17 DIAGNOSIS — Z7952 Long term (current) use of systemic steroids: Secondary | ICD-10-CM | POA: Diagnosis not present

## 2021-12-17 DIAGNOSIS — Z191 Hormone sensitive malignancy status: Secondary | ICD-10-CM | POA: Diagnosis not present

## 2021-12-17 DIAGNOSIS — Z7901 Long term (current) use of anticoagulants: Secondary | ICD-10-CM | POA: Diagnosis not present

## 2021-12-17 DIAGNOSIS — Z9221 Personal history of antineoplastic chemotherapy: Secondary | ICD-10-CM | POA: Diagnosis not present

## 2021-12-24 ENCOUNTER — Ambulatory Visit: Payer: Medicare Other | Attending: Cardiology

## 2021-12-24 DIAGNOSIS — I480 Paroxysmal atrial fibrillation: Secondary | ICD-10-CM

## 2021-12-24 DIAGNOSIS — Z5181 Encounter for therapeutic drug level monitoring: Secondary | ICD-10-CM | POA: Diagnosis not present

## 2021-12-24 DIAGNOSIS — I4819 Other persistent atrial fibrillation: Secondary | ICD-10-CM | POA: Diagnosis not present

## 2021-12-24 DIAGNOSIS — Z23 Encounter for immunization: Secondary | ICD-10-CM | POA: Diagnosis not present

## 2021-12-24 LAB — POCT INR: INR: 2.5 (ref 2.0–3.0)

## 2021-12-24 NOTE — Patient Instructions (Signed)
Description   Continue warfarin 1 tablet daily.  INR check 6 weeks Call if scheduled for any procedures or has any new medications  [phone]

## 2021-12-31 DIAGNOSIS — N3090 Cystitis, unspecified without hematuria: Secondary | ICD-10-CM | POA: Diagnosis not present

## 2021-12-31 DIAGNOSIS — N3001 Acute cystitis with hematuria: Secondary | ICD-10-CM | POA: Diagnosis not present

## 2022-01-16 ENCOUNTER — Other Ambulatory Visit (HOSPITAL_COMMUNITY): Payer: Self-pay | Admitting: Physician Assistant

## 2022-01-16 ENCOUNTER — Ambulatory Visit (HOSPITAL_COMMUNITY)
Admission: RE | Admit: 2022-01-16 | Discharge: 2022-01-16 | Disposition: A | Discharge status: Home / Self Care | Payer: Medicare Other | Source: Ambulatory Visit | Attending: Physician Assistant | Admitting: Physician Assistant

## 2022-01-16 DIAGNOSIS — L03116 Cellulitis of left lower limb: Secondary | ICD-10-CM | POA: Diagnosis not present

## 2022-01-16 DIAGNOSIS — M7989 Other specified soft tissue disorders: Secondary | ICD-10-CM | POA: Diagnosis not present

## 2022-01-16 DIAGNOSIS — Z8679 Personal history of other diseases of the circulatory system: Secondary | ICD-10-CM | POA: Diagnosis not present

## 2022-01-16 NOTE — Progress Notes (Signed)
Lower extremity venous left study completed.  Preliminary results relayed to [place], [place].   See CV Proc for preliminary results report.   Darlin Coco, RDMS, RVT

## 2022-01-20 DIAGNOSIS — R040 Epistaxis: Secondary | ICD-10-CM | POA: Diagnosis not present

## 2022-01-20 DIAGNOSIS — L03116 Cellulitis of left lower limb: Secondary | ICD-10-CM | POA: Diagnosis not present

## 2022-01-20 DIAGNOSIS — M7989 Other specified soft tissue disorders: Secondary | ICD-10-CM | POA: Diagnosis not present

## 2022-01-20 DIAGNOSIS — Z7901 Long term (current) use of anticoagulants: Secondary | ICD-10-CM | POA: Diagnosis not present

## 2022-01-29 DIAGNOSIS — R600 Localized edema: Secondary | ICD-10-CM | POA: Diagnosis not present

## 2022-01-29 DIAGNOSIS — Z7901 Long term (current) use of anticoagulants: Secondary | ICD-10-CM | POA: Diagnosis not present

## 2022-01-29 DIAGNOSIS — I9589 Other hypotension: Secondary | ICD-10-CM | POA: Diagnosis not present

## 2022-01-29 DIAGNOSIS — D649 Anemia, unspecified: Secondary | ICD-10-CM | POA: Diagnosis not present

## 2022-02-04 ENCOUNTER — Ambulatory Visit: Payer: Medicare Other | Attending: Cardiology

## 2022-02-04 DIAGNOSIS — I480 Paroxysmal atrial fibrillation: Secondary | ICD-10-CM | POA: Diagnosis not present

## 2022-02-04 DIAGNOSIS — Z5181 Encounter for therapeutic drug level monitoring: Secondary | ICD-10-CM | POA: Diagnosis not present

## 2022-02-04 DIAGNOSIS — I4819 Other persistent atrial fibrillation: Secondary | ICD-10-CM | POA: Diagnosis not present

## 2022-02-04 LAB — POCT INR: INR: 3.7 — AB (ref 2.0–3.0)

## 2022-02-04 NOTE — Patient Instructions (Signed)
Description   Hold today's dose and then continue warfarin 1 tablet daily.  INR check 2 weeks Call if scheduled for any procedures or has any new medications  [phone]

## 2022-02-18 ENCOUNTER — Ambulatory Visit: Payer: Medicare Other | Attending: Cardiology

## 2022-02-18 ENCOUNTER — Ambulatory Visit (INDEPENDENT_AMBULATORY_CARE_PROVIDER_SITE_OTHER): Payer: Medicare Other

## 2022-02-18 DIAGNOSIS — Z5181 Encounter for therapeutic drug level monitoring: Secondary | ICD-10-CM | POA: Diagnosis not present

## 2022-02-18 DIAGNOSIS — I480 Paroxysmal atrial fibrillation: Secondary | ICD-10-CM

## 2022-02-18 DIAGNOSIS — I4819 Other persistent atrial fibrillation: Secondary | ICD-10-CM

## 2022-02-18 DIAGNOSIS — I495 Sick sinus syndrome: Secondary | ICD-10-CM | POA: Diagnosis not present

## 2022-02-18 LAB — CUP PACEART REMOTE DEVICE CHECK
Battery Remaining Longevity: 116 mo
Battery Remaining Percentage: 87 %
Battery Voltage: 3.04 V
Brady Statistic RV Percent Paced: 14 %
Date Time Interrogation Session: 20231219020022
Implantable Lead Connection Status: 753985
Implantable Lead Connection Status: 753985
Implantable Lead Implant Date: 20220321
Implantable Lead Implant Date: 20220321
Implantable Lead Location: 753859
Implantable Lead Location: 753860
Implantable Pulse Generator Implant Date: 20220321
Lead Channel Impedance Value: 480 Ohm
Lead Channel Pacing Threshold Amplitude: 0.75 V
Lead Channel Pacing Threshold Pulse Width: 0.4 ms
Lead Channel Sensing Intrinsic Amplitude: 12 mV
Lead Channel Setting Pacing Amplitude: 2.5 V
Lead Channel Setting Pacing Pulse Width: 0.4 ms
Lead Channel Setting Sensing Sensitivity: 2 mV
Pulse Gen Model: 2272
Pulse Gen Serial Number: 3909458

## 2022-02-18 LAB — POCT INR: INR: 1.9 — AB (ref 2.0–3.0)

## 2022-02-18 NOTE — Patient Instructions (Signed)
Description   Take 1.5 tablets today and then continue warfarin 1 tablet daily.  INR check 3 weeks Call if scheduled for any procedures or has any new medications  [phone]

## 2022-02-28 DIAGNOSIS — J069 Acute upper respiratory infection, unspecified: Secondary | ICD-10-CM | POA: Diagnosis not present

## 2022-02-28 DIAGNOSIS — J209 Acute bronchitis, unspecified: Secondary | ICD-10-CM | POA: Diagnosis not present

## 2022-02-28 DIAGNOSIS — R051 Acute cough: Secondary | ICD-10-CM | POA: Diagnosis not present

## 2022-03-11 ENCOUNTER — Ambulatory Visit: Payer: Medicare Other | Attending: Cardiology

## 2022-03-11 DIAGNOSIS — I480 Paroxysmal atrial fibrillation: Secondary | ICD-10-CM | POA: Diagnosis not present

## 2022-03-11 DIAGNOSIS — Z5181 Encounter for therapeutic drug level monitoring: Secondary | ICD-10-CM

## 2022-03-11 DIAGNOSIS — I4819 Other persistent atrial fibrillation: Secondary | ICD-10-CM | POA: Diagnosis not present

## 2022-03-11 LAB — POCT INR: INR: 2.1 (ref 2.0–3.0)

## 2022-03-11 NOTE — Patient Instructions (Addendum)
Description   Take 1.5 tablets today and then continue warfarin 1 tablet daily.  INR check 5 weeks Call if scheduled for any procedures or has any new medications  [phone]

## 2022-03-17 NOTE — Progress Notes (Signed)
Remote pacemaker transmission.   

## 2022-03-19 DIAGNOSIS — D509 Iron deficiency anemia, unspecified: Secondary | ICD-10-CM | POA: Diagnosis not present

## 2022-04-09 DIAGNOSIS — D500 Iron deficiency anemia secondary to blood loss (chronic): Secondary | ICD-10-CM | POA: Diagnosis not present

## 2022-04-09 DIAGNOSIS — I10 Essential (primary) hypertension: Secondary | ICD-10-CM | POA: Diagnosis not present

## 2022-04-09 DIAGNOSIS — Z5181 Encounter for therapeutic drug level monitoring: Secondary | ICD-10-CM | POA: Diagnosis not present

## 2022-04-15 ENCOUNTER — Ambulatory Visit: Payer: Medicare Other | Attending: Interventional Cardiology

## 2022-04-15 DIAGNOSIS — I4819 Other persistent atrial fibrillation: Secondary | ICD-10-CM | POA: Insufficient documentation

## 2022-04-15 DIAGNOSIS — I480 Paroxysmal atrial fibrillation: Secondary | ICD-10-CM | POA: Insufficient documentation

## 2022-04-15 DIAGNOSIS — Z5181 Encounter for therapeutic drug level monitoring: Secondary | ICD-10-CM | POA: Diagnosis not present

## 2022-04-15 LAB — POCT INR: INR: 3 (ref 2.0–3.0)

## 2022-04-15 NOTE — Patient Instructions (Signed)
Description   Continue warfarin 1 tablet daily.  Recheck INR in 4 weeks.  Call if scheduled for any procedures or has any new medications  [phone]

## 2022-04-16 ENCOUNTER — Other Ambulatory Visit: Payer: Medicare Other

## 2022-04-16 ENCOUNTER — Telehealth: Payer: Medicare Other | Admitting: Hematology

## 2022-04-21 ENCOUNTER — Inpatient Hospital Stay: Payer: Medicare Other | Attending: Hematology | Admitting: Hematology

## 2022-04-21 DIAGNOSIS — M353 Polymyalgia rheumatica: Secondary | ICD-10-CM | POA: Diagnosis not present

## 2022-04-21 DIAGNOSIS — E785 Hyperlipidemia, unspecified: Secondary | ICD-10-CM | POA: Insufficient documentation

## 2022-04-21 DIAGNOSIS — I10 Essential (primary) hypertension: Secondary | ICD-10-CM | POA: Diagnosis not present

## 2022-04-21 DIAGNOSIS — Z79899 Other long term (current) drug therapy: Secondary | ICD-10-CM | POA: Diagnosis not present

## 2022-04-21 DIAGNOSIS — I2510 Atherosclerotic heart disease of native coronary artery without angina pectoris: Secondary | ICD-10-CM | POA: Insufficient documentation

## 2022-04-21 DIAGNOSIS — I350 Nonrheumatic aortic (valve) stenosis: Secondary | ICD-10-CM | POA: Insufficient documentation

## 2022-04-21 DIAGNOSIS — Z7901 Long term (current) use of anticoagulants: Secondary | ICD-10-CM | POA: Diagnosis not present

## 2022-04-21 DIAGNOSIS — C61 Malignant neoplasm of prostate: Secondary | ICD-10-CM | POA: Diagnosis not present

## 2022-04-21 DIAGNOSIS — I480 Paroxysmal atrial fibrillation: Secondary | ICD-10-CM | POA: Insufficient documentation

## 2022-04-21 DIAGNOSIS — C9110 Chronic lymphocytic leukemia of B-cell type not having achieved remission: Secondary | ICD-10-CM | POA: Diagnosis not present

## 2022-04-21 DIAGNOSIS — Z1501 Genetic susceptibility to malignant neoplasm of breast: Secondary | ICD-10-CM | POA: Insufficient documentation

## 2022-04-21 NOTE — Progress Notes (Signed)
Robert [name]   HEMATOLOGY/ONCOLOGY PHONE VISIT NOTE  Date of Service: 04/21/2022  Patient Care Team: Josetta Huddle, MD as PCP - General (Internal Medicine) Robert Booze, MD as PCP - Cardiology (Cardiology)  CHIEF COMPLAINTS/PURPOSE OF CONSULTATION:  F/u for small lymphocytic lmyphoma  HISTORY OF PRESENTING ILLNESS:   Robert [name] is a wonderful 89 y.o. male who has been referred to [place] by Dr .Inda Merlin, Herbie Baltimore, MD/Jeffrey Constance Holster, MD for evaluation and management of small lymphocytic lymphoma.  He was last seen by me on 08/21/2021 and was doing well.   He is here with his daughter and his wife and he is doing well overall. His daughter states that he has fluid in his ears which has affected his hearing aids and causing him hearing problems. He denies fever, chill, new infection, changes in breathing, abdominal pain, and back pain.  He notes he has good appetite with steady weight.   His wife reports he occasionally has night sweat, which has been going on for a long time and has noting to do with CLL. He reports that he his having occasional leg pain as well. He notes that his legs swell up at night, but not during the day.   His daughter reports that he is positive for germline brca2 mutation.  He reports he has prostate cancer, which is getting treated for it. He is receiving leuprolide every 6 months.    His family history only has prostate cancer.   He is planning on receiving influenza vaccine, RSV vaccine, and the COVID-19 booster.   INTERVAL HISTORY: Robert [name] is a wonderful 89 y.o. male who is connected via phone for evaluation and management of small lymphocytic lymphoma. He was initially seen by me on 12/11/2021.  .I connected with Robert [name] on 04/21/2022 at  3:30 PM EST by telephone visit and verified that I am speaking with the correct person using two identifiers.   Patient reports he has been doing well overall without any new medical concerns  since our last visit. He denies fever, chills, night sweats, unexpected weight loss, new lumps/bumps, infection issues, shortness of breath, chest pain, back pain, or leg swelling.   He notes that his PCP retired and now his PCP is PA [employer].   Labs from 04/10/2022 were discussed in detail with the patient and his daughter.   I discussed the limitations, risks, security and privacy concerns of performing an evaluation and management service by telemedicine and the availability of in-person appointments. I also discussed with the patient that there may be a patient responsible charge related to this service. The patient expressed understanding and agreed to proceed.   Other persons participating in the visit and their role in the encounter: His daughter    Patient's location: Home  Provider's location: [facility]   Chief Complaint: small lymphocytic lymphoma     MEDICAL HISTORY:  Past Medical History:  Diagnosis Date   Aortic stenosis    a. s/p tissue AVR 05/2013;  b. Echo (06/2013):  Mod LVH, EF 60-65%, no RWMA, Gr 2 DD, AVR ok (mean 13 mmHg), MAC, mild MR, mod LAE, mild RAE, PASP 46 mmHg (mild pulmo HTN)   Blood loss anemia    a. 05/2009 a/w GIB.   CAD (coronary artery disease)    a. Cath 04/2009: BMS to LCx 04/2009; CTO of RCA with L-R collaterals, 40% ostial diag.   Carpal tunnel syndrome    bilateral, carpal tunnel release x 2   Esophageal  ulcer    a. 05/2009 with hemorrhage - injected with epinephrine-Dr. Herbie Baltimore Buccini.   Esophagitis    a. 05/2009 a/w GIB.   GERD (gastroesophageal reflux disease)    EGD, Dr. Toney Rakes 2008   GI bleed    a. 05/2009 as above: with associated anemia. Lower esophageal ulcer with extensive erosive esophagitis and large clot by EGD 05/2009.    Hx of colonoscopy 2008   Dr. Toney Rakes, polyps- 5 yr f/u recommended   Hyperlipidemia    Hypertension    PAF (paroxysmal atrial fibrillation) ([place])    a. Post-op AVR;  b. 04/2014 recurrent.    Perforated ear drum    right ear drum, tube in left ear drum, Dr. Constance Holster   Polymyalgia rheumatica [facility])    Prostate cancer [facility])    Vitamin D deficiency     SURGICAL HISTORY: Past Surgical History:  Procedure Laterality Date   AORTIC VALVE REPLACEMENT N/A 05/12/2013   Procedure: AORTIC VALVE REPLACEMENT (AVR);  Surgeon: Ivin Poot, MD;  Location: [place];  Service: Open Heart Surgery;  Laterality: N/A;   CARDIAC CATHETERIZATION  2011   CARDIOVERSION N/A 04/12/2020   Procedure: CARDIOVERSION;  Surgeon: Dorothy Spark, MD;  Location: [place];  Service: Cardiovascular;  Laterality: N/A;   CORONARY ARTERY BYPASS GRAFT N/A 05/12/2013   Procedure: CORONARY ARTERY BYPASS GRAFTING (CABG);  Surgeon: Ivin Poot, MD;  Location: [place];  Service: Open Heart Surgery;  Laterality: N/A;  CABG x 1 using left leg greater saphenous vein harvested endoscopically   INTRAOPERATIVE TRANSESOPHAGEAL ECHOCARDIOGRAM N/A 05/12/2013   Procedure: INTRAOPERATIVE TRANSESOPHAGEAL ECHOCARDIOGRAM;  Surgeon: Ivin Poot, MD;  Location: [place];  Service: Open Heart Surgery;  Laterality: N/A;   LEFT AND RIGHT HEART CATHETERIZATION WITH CORONARY ANGIOGRAM N/A 05/09/2013   Procedure: LEFT AND RIGHT HEART CATHETERIZATION WITH CORONARY ANGIOGRAM;  Surgeon: Lorretta Harp, MD;  Location[facility] CATH LAB;  Service: Cardiovascular;  Laterality: N/A;   MAZE N/A 05/12/2013   Procedure: MAZE;  Surgeon: Ivin Poot, MD;  Location: [place];  Service: Open Heart Surgery;  Laterality: N/A;   PACEMAKER IMPLANT N/A 05/21/2020   Procedure: PACEMAKER IMPLANT;  Surgeon: Evans Lance, MD;  Location: [place] CV LAB;  Service: Cardiovascular;  Laterality: N/A;   PROSTATECTOMY     TEE WITHOUT CARDIOVERSION N/A 04/12/2020   Procedure: TRANSESOPHAGEAL ECHOCARDIOGRAM (TEE);  Surgeon: Dorothy Spark, MD;  Location: [facility] ENDOSCOPY;  Service: Cardiovascular;  Laterality: N/A;    SOCIAL HISTORY: Social History   Socioeconomic  History   Marital status: Married    Spouse name: Not on file   Number of children: Not on file   Years of education: Not on file   Highest education level: Not on file  Occupational History   Not on file  Tobacco Use   Smoking status: Never   Smokeless tobacco: Never  Vaping Use   Vaping Use: Never used  Substance and Sexual Activity   Alcohol use: No   Drug use: No   Sexual activity: Not on file  Other Topics Concern   Not on file  Social History Narrative   Not on file   Social Determinants of Health   Financial Resource Strain: Not on file  Food Insecurity: Not on file  Transportation Needs: Not on file  Physical Activity: Not on file  Stress: Not on file  Social Connections: Not on file  Intimate Partner Violence: Not on file    FAMILY HISTORY: Family History  Problem Relation Age of Onset   CVA Mother    Kidney disease Father    Breast cancer Daughter     ALLERGIES:  is allergic to amoxicillin, lisinopril, and kenalog [triamcinolone].  MEDICATIONS:  Current Outpatient Medications  Medication Sig Dispense Refill   acetaminophen (TYLENOL) 325 MG tablet Take 650 mg by mouth every 6 (six) hours as needed for moderate pain.     Ascorbic Acid (VITAMIN C PO) Take 1 tablet by mouth 2 (two) times a week.     Cholecalciferol (DIALYVITE VITAMIN D 5000) 125 MCG (5000 UT) capsule Take 5,000 Units by mouth 2 (two) times a week.     digoxin (LANOXIN) 0.125 MG tablet Take 1 tablet (125 mcg total) by mouth daily. 90 tablet 3   furosemide (LASIX) 40 MG tablet TAKE 1 TABLET DAILY 90 tablet 3   KLOR-CON M20 20 MEQ tablet Take 20 mEq by mouth daily.     losartan (COZAAR) 50 MG tablet Take 1 tablet (50 mg total) by mouth daily. 90 tablet 3   metoprolol succinate (TOPROL-XL) 50 MG 24 hr tablet Take 1 tablet (50 mg total) by mouth in the morning and at bedtime. 180 tablet 3   omeprazole (PRILOSEC) 20 MG capsule Take 20 mg by mouth every morning.     rosuvastatin (CRESTOR) 20 MG  tablet TAKE 1 TABLET DAILY 90 tablet 3   warfarin (JANTOVEN) 5 MG tablet TAKE 1 TABLET BY MOUTH DAILY AS DIRECTED BY THE COUMADIN CLINIC 100 tablet 3   No current facility-administered medications for this visit.    REVIEW OF SYSTEMS:   10 Point review of Systems was done is negative except as noted above.  PHYSICAL EXAMINATION: telemedicine visit  LABORATORY DATA:  I have reviewed the data as listed  .    Latest Ref Rng & Units 12/11/2021    1:00 PM 08/21/2021   12:46 PM 01/31/2021    3:26 PM  CBC  WBC 4.0 - 10.5 K/uL 12.4  11.4  11.2   Hemoglobin 13.0 - 17.0 g/dL 9.8  9.7  10.9   Hematocrit 39.0 - 52.0 % 29.8  29.7  33.1   Platelets 150 - 400 K/uL 174  142  148     .    Latest Ref Rng & Units 12/11/2021    1:00 PM 08/21/2021   12:46 PM 01/31/2021    3:26 PM  CMP  Glucose 70 - 99 mg/dL 105  104  97   BUN 8 - 23 mg/dL '18  14  18   '$ Creatinine 0.61 - 1.24 mg/dL 0.85  0.86  0.83   Sodium 135 - 145 mmol/L 141  140  141   Potassium 3.5 - 5.1 mmol/L 4.3  3.8  4.8   Chloride 98 - 111 mmol/L 107  104  104   CO2 22 - 32 mmol/L '27  29  26   '$ Calcium 8.9 - 10.3 mg/dL 9.0  9.6  9.3   Total Protein 6.5 - 8.1 g/dL 6.4  6.8    Total Bilirubin 0.3 - 1.2 mg/dL 0.7  0.8    Alkaline Phos 38 - 126 U/L 31  31    AST 15 - 41 U/L 14  16    ALT 0 - 44 U/L 10  14       RADIOGRAPHIC STUDIES: I have personally reviewed the radiological images as listed and agreed with the findings in the report. No results found.  ASSESSMENT & PLAN:   89 year old  male with multiple medical comorbidities as noted above with:  #1Small lymphocytic lymphoma/chronic lymphocytic leukemia  #2 h/o Prostate cancer -following with urology. On lupron q6 months   PLAN: -outiside labs reviewed -Discussed lab results from 04/10/2022 with the patient and his daughter. CBC shows slightly decreased WBC of 10.3, hemoglobin of 10.7. CMP is stable.  -Continue following up with PCP, PA [place] with labs per patients  and his daughters preference.  -Answered all of patient and his daughter's questions.   FOLLOW-UP: As needed.   The total time spent in the appointment was 20 minutes* .  All of the patient's questions were answered with apparent satisfaction. The patient knows to call the clinic with any problems, questions or concerns.   Sullivan Lone MD MS AAHIVMS [facility] Hematology/Oncology Physician [facility]  .*Total Encounter Time as defined by the Centers for Medicare and Medicaid Services includes, in addition to the face-to-face time of a patient visit (documented in the note above) non-face-to-face time: obtaining and reviewing outside history, ordering and reviewing medications, tests or procedures, care coordination (communications with other health care professionals or caregivers) and documentation in the medical record.   I, Cleda Mccreedy, am acting as a Education administrator for Sullivan Lone, MD. .I have reviewed the above documentation for accuracy and completeness, and I agree with the above. Brunetta Genera MD

## 2022-04-23 ENCOUNTER — Other Ambulatory Visit: Payer: Self-pay

## 2022-04-23 ENCOUNTER — Telehealth: Payer: Self-pay | Admitting: Interventional Cardiology

## 2022-04-23 DIAGNOSIS — Z5181 Encounter for therapeutic drug level monitoring: Secondary | ICD-10-CM

## 2022-04-23 MED ORDER — METOPROLOL SUCCINATE ER 50 MG PO TB24: 50.0000 mg | ORAL_TABLET | Freq: Two times a day (BID) | ORAL | 3 refills | Status: DC

## 2022-04-23 MED ORDER — ROSUVASTATIN CALCIUM 20 MG PO TABS: 20.0000 mg | ORAL_TABLET | Freq: Every day | ORAL | 3 refills | Status: DC

## 2022-04-23 NOTE — Telephone Encounter (Signed)
Patient's daughter is calling to talk with Dr. Irish Lack or nurse about medication changes and prescription cards. Please call daughter back to discuss

## 2022-04-23 NOTE — Telephone Encounter (Signed)
Left message to call back  

## 2022-04-23 NOTE — Telephone Encounter (Signed)
Pt. Daugher stating her father needs refill on Crestor and Metoprolol. Will send medication refill to pharmacy on file.

## 2022-04-25 ENCOUNTER — Encounter: Payer: Self-pay | Admitting: Hematology

## 2022-04-25 ENCOUNTER — Telehealth: Payer: Self-pay | Admitting: Interventional Cardiology

## 2022-04-25 DIAGNOSIS — Z5181 Encounter for therapeutic drug level monitoring: Secondary | ICD-10-CM

## 2022-04-25 MED ORDER — METOPROLOL SUCCINATE ER 50 MG PO TB24: 50.0000 mg | ORAL_TABLET | Freq: Two times a day (BID) | ORAL | 3 refills | Status: DC

## 2022-04-25 MED ORDER — ROSUVASTATIN CALCIUM 20 MG PO TABS: 20.0000 mg | ORAL_TABLET | Freq: Every day | ORAL | 3 refills | Status: DC

## 2022-04-25 NOTE — Telephone Encounter (Signed)
Patient's daughter called to talk with Dr. Irish Lack or nurse. Please call

## 2022-04-25 NOTE — Telephone Encounter (Signed)
Spoke with the patient's daughter who states that the refills for metoprolol and crestor that she called about the other day were not showing up as being filled. I advised that they were sent to [facility]. She states that they should not be sent there and they need to be sent to number listed on patient's prescription drug card.  I called the number on his card which is [facility]. Medications have been sent there as requested.

## 2022-05-13 ENCOUNTER — Ambulatory Visit: Payer: Medicare Other | Attending: Interventional Cardiology

## 2022-05-13 DIAGNOSIS — Z5181 Encounter for therapeutic drug level monitoring: Secondary | ICD-10-CM

## 2022-05-13 DIAGNOSIS — I480 Paroxysmal atrial fibrillation: Secondary | ICD-10-CM | POA: Insufficient documentation

## 2022-05-13 DIAGNOSIS — I4819 Other persistent atrial fibrillation: Secondary | ICD-10-CM | POA: Insufficient documentation

## 2022-05-13 LAB — POCT INR: INR: 1.7 — AB (ref 2.0–3.0)

## 2022-05-13 NOTE — Patient Instructions (Signed)
Description   Continue warfarin 1 tablet daily.  Recheck INR in 1 week.  Call if scheduled for any procedures or has any new medications  [phone]

## 2022-05-20 ENCOUNTER — Ambulatory Visit (INDEPENDENT_AMBULATORY_CARE_PROVIDER_SITE_OTHER): Payer: Medicare Other

## 2022-05-20 ENCOUNTER — Ambulatory Visit: Payer: Medicare Other | Attending: Interventional Cardiology

## 2022-05-20 DIAGNOSIS — I480 Paroxysmal atrial fibrillation: Secondary | ICD-10-CM | POA: Insufficient documentation

## 2022-05-20 DIAGNOSIS — I4819 Other persistent atrial fibrillation: Secondary | ICD-10-CM | POA: Diagnosis not present

## 2022-05-20 DIAGNOSIS — I495 Sick sinus syndrome: Secondary | ICD-10-CM

## 2022-05-20 DIAGNOSIS — Z5181 Encounter for therapeutic drug level monitoring: Secondary | ICD-10-CM

## 2022-05-20 LAB — POCT INR: INR: 2 (ref 2.0–3.0)

## 2022-05-20 NOTE — Patient Instructions (Addendum)
Description   Take 1.5 tablets today and then continue warfarin 1 tablet daily.  Recheck INR in 6 weeks.  Call if scheduled for any procedures or has any new medications  [phone]

## 2022-05-21 LAB — CUP PACEART REMOTE DEVICE CHECK
Battery Remaining Longevity: 113 mo
Battery Remaining Percentage: 86 %
Battery Voltage: 3.04 V
Brady Statistic RV Percent Paced: 14 %
Date Time Interrogation Session: 20240319020014
Implantable Lead Connection Status: 753985
Implantable Lead Connection Status: 753985
Implantable Lead Implant Date: 20220321
Implantable Lead Implant Date: 20220321
Implantable Lead Location: 753859
Implantable Lead Location: 753860
Implantable Pulse Generator Implant Date: 20220321
Lead Channel Impedance Value: 480 Ohm
Lead Channel Pacing Threshold Amplitude: 0.75 V
Lead Channel Pacing Threshold Pulse Width: 0.4 ms
Lead Channel Sensing Intrinsic Amplitude: 10.5 mV
Lead Channel Setting Pacing Amplitude: 2.5 V
Lead Channel Setting Pacing Pulse Width: 0.4 ms
Lead Channel Setting Sensing Sensitivity: 2 mV
Pulse Gen Model: 2272
Pulse Gen Serial Number: 3909458

## 2022-06-03 DIAGNOSIS — C4442 Squamous cell carcinoma of skin of scalp and neck: Secondary | ICD-10-CM | POA: Diagnosis not present

## 2022-06-03 DIAGNOSIS — D485 Neoplasm of uncertain behavior of skin: Secondary | ICD-10-CM | POA: Diagnosis not present

## 2022-06-03 DIAGNOSIS — L821 Other seborrheic keratosis: Secondary | ICD-10-CM | POA: Diagnosis not present

## 2022-06-03 DIAGNOSIS — C44602 Unspecified malignant neoplasm of skin of right upper limb, including shoulder: Secondary | ICD-10-CM | POA: Diagnosis not present

## 2022-06-03 DIAGNOSIS — Z85828 Personal history of other malignant neoplasm of skin: Secondary | ICD-10-CM | POA: Diagnosis not present

## 2022-06-03 DIAGNOSIS — C4911 Malignant neoplasm of connective and soft tissue of right upper limb, including shoulder: Secondary | ICD-10-CM | POA: Diagnosis not present

## 2022-06-03 DIAGNOSIS — C44529 Squamous cell carcinoma of skin of other part of trunk: Secondary | ICD-10-CM | POA: Diagnosis not present

## 2022-06-03 DIAGNOSIS — C44629 Squamous cell carcinoma of skin of left upper limb, including shoulder: Secondary | ICD-10-CM | POA: Diagnosis not present

## 2022-06-17 DIAGNOSIS — C61 Malignant neoplasm of prostate: Secondary | ICD-10-CM | POA: Diagnosis not present

## 2022-06-28 DIAGNOSIS — R051 Acute cough: Secondary | ICD-10-CM | POA: Diagnosis not present

## 2022-06-28 DIAGNOSIS — J209 Acute bronchitis, unspecified: Secondary | ICD-10-CM | POA: Diagnosis not present

## 2022-06-28 DIAGNOSIS — R0981 Nasal congestion: Secondary | ICD-10-CM | POA: Diagnosis not present

## 2022-06-28 DIAGNOSIS — J019 Acute sinusitis, unspecified: Secondary | ICD-10-CM | POA: Diagnosis not present

## 2022-07-01 ENCOUNTER — Ambulatory Visit: Payer: Medicare Other | Attending: Cardiology

## 2022-07-01 DIAGNOSIS — C4442 Squamous cell carcinoma of skin of scalp and neck: Secondary | ICD-10-CM | POA: Diagnosis not present

## 2022-07-01 DIAGNOSIS — I4819 Other persistent atrial fibrillation: Secondary | ICD-10-CM | POA: Diagnosis not present

## 2022-07-01 DIAGNOSIS — I480 Paroxysmal atrial fibrillation: Secondary | ICD-10-CM

## 2022-07-01 DIAGNOSIS — Z85828 Personal history of other malignant neoplasm of skin: Secondary | ICD-10-CM | POA: Diagnosis not present

## 2022-07-01 DIAGNOSIS — Z5181 Encounter for therapeutic drug level monitoring: Secondary | ICD-10-CM | POA: Diagnosis not present

## 2022-07-01 LAB — POCT INR: INR: 2.5 (ref 2.0–3.0)

## 2022-07-01 NOTE — Patient Instructions (Signed)
Description   Continue warfarin 1 tablet daily.  Recheck INR in 4 weeks (per pt request. Aware of risk)  Call if scheduled for any procedures or has any new medications  [phone]

## 2022-07-01 NOTE — Progress Notes (Signed)
Remote pacemaker transmission.   

## 2022-07-01 NOTE — Progress Notes (Unsigned)
Cardiology Office Note   Date:  07/02/2022   ID:  Robert [name], DOB [birth year], MRN [id number]  PCP:  Robert Officer, PA    No chief complaint on file.  CAD  Wt Readings from Last 3 Encounters:  07/02/22 166 lb (75.3 kg)  12/11/21 170 lb 12.8 oz (77.5 kg)  10/11/21 169 lb (76.7 kg)       History of Present Illness: Robert [name] is a 89 y.o. male  who had a BMS to the circumflex in 2011.  He had AFib in 2/15.  Workup revealed severe AS.  He had AVR with single vessel CABG, Maze procedure and left atrial clip in 2015. Later in 2015, He had presyncope and his HR was in the 40s.  Resolved with stopping beta blocker and digoxin.  He did well for a while.    In 2/16, he had palpitations and went to the ER. He was in atrial flutter 2:1 block.  He was sent home.  He was started on metoprolol and Eliquis in the clinic the next day.  Rhythm converted.     Eliquis was changed to COumadin due to cost reasons.   Chronic left ankle swelling which progressed to the whole left leg in 2017, vein removed for CABG. It  Was caused by lymph node blocking vein flow. Initally diagnosed with PrCA [phone]. Recent check showed PSA was elevated and has reduced with Lupron.  Swelling is nearly completely resolved after Lupron.  He had a w/u for DVT which was negative.  Following with Dr. Earlene Plater for his prostate cancer.  Undetectable PSA in 12/18.   Left leg cellulitis in 8/19, that has been persistent.  U/s showed no DVT. Finished antibiotic course.    He had Covid in January 2021.  He received an antibody infusion.  He recovered very well and had minimal symptoms.  He got fully vacinated.   Chronic foot pain prevents exercise.  He feels like his joint pain gets worse when he takes his statin regularly.  He has had some improvement when he holds his statin.  He did stop taking his statin for a while and his LDL increased to 156 back in early February 2021.   He has had AFib with RVR.   DCCV was successful but he went back to AFib after 2 days.  He has had some anemia as well.  Hbg 10 -10.5.   Noted that his HR goes to the 120-130 range back in 2022.      He had some SHOB in late 2022.    Got some steroids for back pain.  Had some relief, but pain has returned.  BNP down to 458 in 01/2021. (Had been > 2000 in 04/2020). BNP /Pacemaker findings led to daily Lasix daily.  K and Cr were well controlled in 01/2021.    Still feels SHOB. Had laryngitis at Christmas 2022.  Took amoxicilllin with no relief.  Then given doxycycline.     He hand his wife had the flu in 04/2021.  Has DOE.  Activity limited by back and leg pain.  Moderate MR by TEE in 2022.   Stil has some DOE.  Has some scratchy throat.  Post nasal drip.    Denies : Chest pain. Dizziness.  Nitroglycerin use. Orthopnea. Palpitations. Paroxysmal nocturnal dyspnea. Shortness of breath. Syncope.    Leg edema, worse at the end of the day.  Past Medical History:  Diagnosis Date   Aortic stenosis  a. s/p tissue AVR 05/2013;  b. Echo (06/2013):  Mod LVH, EF 60-65%, no RWMA, Gr 2 DD, AVR ok (mean 13 mmHg), MAC, mild MR, mod LAE, mild RAE, PASP 46 mmHg (mild pulmo HTN)   Blood loss anemia    a. 05/2009 a/w GIB.   CAD (coronary artery disease)    a. Cath 04/2009: BMS to LCx 04/2009; CTO of RCA with L-R collaterals, 40% ostial diag.   Carpal tunnel syndrome    bilateral, carpal tunnel release x 2   Esophageal ulcer    a. 05/2009 with hemorrhage - injected with epinephrine-Dr. Molly Maduro Buccini.   Esophagitis    a. 05/2009 a/w GIB.   GERD (gastroesophageal reflux disease)    EGD, Dr. Tyler Aas 2008   GI bleed    a. 05/2009 as above: with associated anemia. Lower esophageal ulcer with extensive erosive esophagitis and large clot by EGD 05/2009.    Hx of colonoscopy 2008   Dr. Tyler Aas, polyps- 5 yr f/u recommended   Hyperlipidemia    Hypertension    PAF (paroxysmal atrial fibrillation) ([facility])    a. Post-op AVR;  b.  04/2014 recurrent.   Perforated ear drum    right ear drum, tube in left ear drum, Dr. Pollyann Kennedy   Polymyalgia rheumatica [facility])    Prostate cancer [facility])    Vitamin D deficiency     Past Surgical History:  Procedure Laterality Date   AORTIC VALVE REPLACEMENT N/A 05/12/2013   Procedure: AORTIC VALVE REPLACEMENT (AVR);  Surgeon: Kerin Perna, MD;  Location: [facility] OR;  Service: Open Heart Surgery;  Laterality: N/A;   CARDIAC CATHETERIZATION  2011   CARDIOVERSION N/A 04/12/2020   Procedure: CARDIOVERSION;  Surgeon: Lars Masson, MD;  Location: [facility] ENDOSCOPY;  Service: Cardiovascular;  Laterality: N/A;   CORONARY ARTERY BYPASS GRAFT N/A 05/12/2013   Procedure: CORONARY ARTERY BYPASS GRAFTING (CABG);  Surgeon: Kerin Perna, MD;  Location: [facility] OR;  Service: Open Heart Surgery;  Laterality: N/A;  CABG x 1 using left leg greater saphenous vein harvested endoscopically   INTRAOPERATIVE TRANSESOPHAGEAL ECHOCARDIOGRAM N/A 05/12/2013   Procedure: INTRAOPERATIVE TRANSESOPHAGEAL ECHOCARDIOGRAM;  Surgeon: Kerin Perna, MD;  Location: [facility] OR;  Service: Open Heart Surgery;  Laterality: N/A;   LEFT AND RIGHT HEART CATHETERIZATION WITH CORONARY ANGIOGRAM N/A 05/09/2013   Procedure: LEFT AND RIGHT HEART CATHETERIZATION WITH CORONARY ANGIOGRAM;  Surgeon: Runell Gess, MD;  [facility] C[facility] LAB;  Service: Cardiovascular;  Laterality: N/A;   MAZE N/A 05/12/2013   Procedure: MAZE;  Surgeon: Kerin Perna, MD;  Location: [facility] OR;  Service: Open Heart Surgery;  Laterality: N/A;   PACEMAKER IMPLANT N/A 05/21/2020   Procedure: PACEMAKER IMPLANT;  Surgeon: Marinus Maw, MD;  Location: [facility] INVASIVE CV LAB;  Service: Cardiovascular;  Laterality: N/A;   PROSTATECTOMY     TEE WITHOUT CARDIOVERSION N/A 04/12/2020   Procedure: TRANSESOPHAGEAL ECHOCARDIOGRAM (TEE);  Surgeon: Lars Masson, MD;  Location: [facility] ENDOSCOPY;  Service: Cardiovascular;  Laterality: N/A;     Current Outpatient Medications  Medication Sig  Dispense Refill   acetaminophen (TYLENOL) 325 MG tablet Take 650 mg by mouth every 6 (six) hours as needed for moderate pain.     Ascorbic Acid (VITAMIN C PO) Take 1 tablet by mouth 2 (two) times a week.     Cholecalciferol (DIALYVITE VITAMIN D 5000) 125 MCG (5000 UT) capsule Take 5,000 Units by mouth 2 (two) times a week.     digoxin (LANOXIN) 0.125 MG tablet Take  1 tablet (125 mcg total) by mouth daily. 90 tablet 3   furosemide (LASIX) 40 MG tablet TAKE 1 TABLET DAILY 90 tablet 3   KLOR-CON M20 20 MEQ tablet Take 20 mEq by mouth daily.     losartan (COZAAR) 50 MG tablet Take 1 tablet (50 mg total) by mouth daily. 90 tablet 3   metoprolol succinate (TOPROL-XL) 50 MG 24 hr tablet Take 1 tablet (50 mg total) by mouth in the morning and at bedtime. 180 tablet 3   omeprazole (PRILOSEC) 20 MG capsule Take 20 mg by mouth every morning.     rosuvastatin (CRESTOR) 20 MG tablet Take 1 tablet (20 mg total) by mouth daily. 90 tablet 3   warfarin (JANTOVEN) 5 MG tablet TAKE 1 TABLET BY MOUTH DAILY AS DIRECTED BY THE COUMADIN CLINIC 100 tablet 3   No current facility-administered medications for this visit.    Allergies:   Amoxicillin, Lisinopril, and Kenalog [triamcinolone]    Social History:  The patient  reports that he has never smoked. He has never used smokeless tobacco. He reports that he does not drink alcohol and does not use drugs.   Family History:  The patient's family history includes Breast cancer in his daughter; CVA in his mother; Kidney disease in his father.    ROS:  Please see the history of present illness.   Otherwise, review of systems are positive for postnasal drip.   All other systems are reviewed and negative.    PHYSICAL EXAM: VS:  BP 132/84 (BP Location: Left Arm, Patient Position: Sitting, Cuff Size: Normal)   Pulse 79   Ht 5\' 9"  (1.753 m)   Wt 166 lb (75.3 kg)   SpO2 97%   BMI 24.51 kg/m  , BMI Body mass index is 24.51 kg/m. GEN: Well nourished, well developed,  in no acute distress HEENT: normal Neck: no JVD, carotid bruits, or masses Cardiac: irregularly irregular; no murmurs, rubs, or gallops,; bilateral LE edema  Respiratory:  clear to auscultation bilaterally, normal work of breathing GI: soft, nontender, nondistended, + BS MS: no deformity or atrophy Skin: warm and dry, no rash Neuro:  Strength and sensation are intact Psych: euthymic mood, full affect    Recent Labs: 12/11/2021: ALT 10; BUN 18; Creatinine 0.85; Hemoglobin 9.8; Platelet Count 174; Potassium 4.3; Sodium 141   Lipid Panel    Component Value Date/Time   CHOL 111 04/24/2020 0917   TRIG 87 04/24/2020 0917   HDL 29 (L) 04/24/2020 0917   CHOLHDL 3.8 04/24/2020 0917   LDLCALC 65 04/24/2020 0917     Other studies Reviewed: Additional studies/ records that were reviewed today with results demonstrating: labs reviewed, LDL 56 in 2022, Cr 0.88in 1/91.   ASSESSMENT AND PLAN:  Atrial fibrillation: We have pursued a strategy of rate control.  Rate controlled.  No bleeding issues.  Status post AVR: Needs SBE prophylaxis.  Moderate mitral regurgitation on TEE in 2022. Chronic anticoagulation: Warfarin for stroke prevention. Hypertension: Avoid excess salt. The current medical regimen is effective;  continue present plan and medications. Hyperlipidemia: Whole food, plant-based diet.  High-fiber diet.  Avoid processed foods. Pacemaker: VVIR, followed by Dr. Ladona Ridgel.   Current medicines are reviewed at length with the patient today.  The patient concerns regarding his medicines were addressed.  The following changes have been made:  No change  Labs/ tests ordered today include:  No orders of the defined types were placed in this encounter.   Recommend 150 minutes/week of aerobic  exercise Low fat, low carb, high fiber diet recommended  Disposition:   FU in 8 months   Signed, Lance Muss, MD  07/02/2022 9:33 AM    [facility] [address]  [place], [place], [place]  [address] Phone: [phone]; Fax: [phone]

## 2022-07-02 ENCOUNTER — Encounter: Payer: Self-pay | Admitting: Interventional Cardiology

## 2022-07-02 ENCOUNTER — Ambulatory Visit: Payer: Medicare Other | Attending: Interventional Cardiology | Admitting: Interventional Cardiology

## 2022-07-02 VITALS — BP 132/84 | HR 79 | Ht 69.0 in | Wt 166.0 lb

## 2022-07-02 DIAGNOSIS — I10 Essential (primary) hypertension: Secondary | ICD-10-CM | POA: Insufficient documentation

## 2022-07-02 DIAGNOSIS — I4819 Other persistent atrial fibrillation: Secondary | ICD-10-CM | POA: Diagnosis not present

## 2022-07-02 DIAGNOSIS — Z952 Presence of prosthetic heart valve: Secondary | ICD-10-CM | POA: Diagnosis not present

## 2022-07-02 DIAGNOSIS — I5032 Chronic diastolic (congestive) heart failure: Secondary | ICD-10-CM | POA: Diagnosis not present

## 2022-07-02 DIAGNOSIS — Z950 Presence of cardiac pacemaker: Secondary | ICD-10-CM | POA: Diagnosis not present

## 2022-07-02 DIAGNOSIS — I2510 Atherosclerotic heart disease of native coronary artery without angina pectoris: Secondary | ICD-10-CM | POA: Insufficient documentation

## 2022-07-02 NOTE — Patient Instructions (Signed)
Medication Instructions:  Your physician recommends that you continue on your current medications as directed. Please refer to the Current Medication list given to you today.  *If you need a refill on your cardiac medications before your next appointment, please call your pharmacy*   Lab Work: None.  If you have labs (blood work) drawn today and your tests are completely normal, you will receive your results only by: MyChart Message (if you have MyChart) OR A paper copy in the mail If you have any lab test that is abnormal or we need to change your treatment, we will call you to review the results.   Testing/Procedures: None.   Follow-Up:  We recommend signing up for the patient portal called "MyChart".  Sign up information is provided on this After Visit Summary.  MyChart is used to connect with patients for Virtual Visits (Telemedicine).  Patients are able to view lab/test results, encounter notes, upcoming appointments, etc.  Non-urgent messages can be sent to your provider as well.   To learn more about what you can do with MyChart, go to [email or web address].    Your next appointment:   6-8 month(s)  Provider:   Lance Muss, MD

## 2022-07-03 ENCOUNTER — Other Ambulatory Visit: Payer: Self-pay | Admitting: Interventional Cardiology

## 2022-07-03 DIAGNOSIS — I480 Paroxysmal atrial fibrillation: Secondary | ICD-10-CM

## 2022-07-03 NOTE — Telephone Encounter (Signed)
Refill request for warfarin:  Last INR was 2.5 on 07/01/22 Next INR due 07/29/22 LOV was 10/11/21  Refill approved.

## 2022-07-14 ENCOUNTER — Ambulatory Visit: Payer: Medicare Other | Admitting: Interventional Cardiology

## 2022-07-16 DIAGNOSIS — Z85828 Personal history of other malignant neoplasm of skin: Secondary | ICD-10-CM | POA: Diagnosis not present

## 2022-07-16 DIAGNOSIS — C44622 Squamous cell carcinoma of skin of right upper limb, including shoulder: Secondary | ICD-10-CM | POA: Diagnosis not present

## 2022-07-16 DIAGNOSIS — L905 Scar conditions and fibrosis of skin: Secondary | ICD-10-CM | POA: Diagnosis not present

## 2022-07-23 ENCOUNTER — Other Ambulatory Visit: Payer: Self-pay | Admitting: Physician Assistant

## 2022-07-23 ENCOUNTER — Ambulatory Visit
Admission: RE | Admit: 2022-07-23 | Discharge: 2022-07-23 | Disposition: A | Discharge status: Home / Self Care | Payer: Medicare Other | Source: Ambulatory Visit | Attending: Physician Assistant | Admitting: Physician Assistant

## 2022-07-23 DIAGNOSIS — D509 Iron deficiency anemia, unspecified: Secondary | ICD-10-CM | POA: Diagnosis not present

## 2022-07-23 DIAGNOSIS — R0982 Postnasal drip: Secondary | ICD-10-CM | POA: Diagnosis not present

## 2022-07-23 DIAGNOSIS — J90 Pleural effusion, not elsewhere classified: Secondary | ICD-10-CM | POA: Diagnosis not present

## 2022-07-23 DIAGNOSIS — I700 Atherosclerosis of aorta: Secondary | ICD-10-CM | POA: Diagnosis not present

## 2022-07-23 DIAGNOSIS — R600 Localized edema: Secondary | ICD-10-CM

## 2022-07-23 DIAGNOSIS — R053 Chronic cough: Secondary | ICD-10-CM

## 2022-07-23 DIAGNOSIS — I10 Essential (primary) hypertension: Secondary | ICD-10-CM | POA: Diagnosis not present

## 2022-07-23 DIAGNOSIS — J984 Other disorders of lung: Secondary | ICD-10-CM | POA: Diagnosis not present

## 2022-07-23 DIAGNOSIS — I4891 Unspecified atrial fibrillation: Secondary | ICD-10-CM | POA: Diagnosis not present

## 2022-07-29 ENCOUNTER — Ambulatory Visit: Payer: Medicare Other | Attending: Interventional Cardiology

## 2022-07-29 DIAGNOSIS — I480 Paroxysmal atrial fibrillation: Secondary | ICD-10-CM

## 2022-07-29 DIAGNOSIS — Z5181 Encounter for therapeutic drug level monitoring: Secondary | ICD-10-CM

## 2022-07-29 LAB — POCT INR: INR: 2.6 (ref 2.0–3.0)

## 2022-07-29 NOTE — Patient Instructions (Addendum)
Description   Continue warfarin 1 tablet daily.  Recheck INR in 6 weeks.  Call if scheduled for any procedures or has any new medications  [phone]

## 2022-07-30 DIAGNOSIS — R7989 Other specified abnormal findings of blood chemistry: Secondary | ICD-10-CM | POA: Diagnosis not present

## 2022-07-30 DIAGNOSIS — R053 Chronic cough: Secondary | ICD-10-CM | POA: Diagnosis not present

## 2022-07-30 DIAGNOSIS — R799 Abnormal finding of blood chemistry, unspecified: Secondary | ICD-10-CM | POA: Diagnosis not present

## 2022-08-14 DIAGNOSIS — D485 Neoplasm of uncertain behavior of skin: Secondary | ICD-10-CM | POA: Diagnosis not present

## 2022-08-14 DIAGNOSIS — C44622 Squamous cell carcinoma of skin of right upper limb, including shoulder: Secondary | ICD-10-CM | POA: Diagnosis not present

## 2022-08-14 DIAGNOSIS — Z85828 Personal history of other malignant neoplasm of skin: Secondary | ICD-10-CM | POA: Diagnosis not present

## 2022-08-14 DIAGNOSIS — C44329 Squamous cell carcinoma of skin of other parts of face: Secondary | ICD-10-CM | POA: Diagnosis not present

## 2022-08-19 ENCOUNTER — Ambulatory Visit (INDEPENDENT_AMBULATORY_CARE_PROVIDER_SITE_OTHER): Payer: Medicare Other

## 2022-08-19 DIAGNOSIS — I480 Paroxysmal atrial fibrillation: Secondary | ICD-10-CM

## 2022-08-19 DIAGNOSIS — I495 Sick sinus syndrome: Secondary | ICD-10-CM

## 2022-08-20 LAB — CUP PACEART REMOTE DEVICE CHECK
Battery Remaining Longevity: 112 mo
Battery Remaining Percentage: 84 %
Battery Voltage: 3.04 V
Brady Statistic RV Percent Paced: 15 %
Date Time Interrogation Session: 20240618020014
Implantable Lead Connection Status: 753985
Implantable Lead Connection Status: 753985
Implantable Lead Implant Date: 20220321
Implantable Lead Implant Date: 20220321
Implantable Lead Location: 753859
Implantable Lead Location: 753860
Implantable Pulse Generator Implant Date: 20220321
Lead Channel Impedance Value: 480 Ohm
Lead Channel Pacing Threshold Amplitude: 0.75 V
Lead Channel Pacing Threshold Pulse Width: 0.4 ms
Lead Channel Sensing Intrinsic Amplitude: 11 mV
Lead Channel Setting Pacing Amplitude: 2.5 V
Lead Channel Setting Pacing Pulse Width: 0.4 ms
Lead Channel Setting Sensing Sensitivity: 2 mV
Pulse Gen Model: 2272
Pulse Gen Serial Number: 3909458

## 2022-09-09 ENCOUNTER — Ambulatory Visit: Payer: Medicare Other | Attending: Cardiology

## 2022-09-09 DIAGNOSIS — Z5181 Encounter for therapeutic drug level monitoring: Secondary | ICD-10-CM | POA: Diagnosis not present

## 2022-09-09 DIAGNOSIS — I4819 Other persistent atrial fibrillation: Secondary | ICD-10-CM | POA: Insufficient documentation

## 2022-09-09 DIAGNOSIS — I480 Paroxysmal atrial fibrillation: Secondary | ICD-10-CM | POA: Insufficient documentation

## 2022-09-09 LAB — POCT INR: INR: 2.1 (ref 2.0–3.0)

## 2022-09-09 NOTE — Patient Instructions (Signed)
Description   Continue warfarin 1 tablet daily.  Recheck INR in 6 weeks.  Call if scheduled for any procedures or has any new medications  [phone]      

## 2022-09-10 DIAGNOSIS — D0439 Carcinoma in situ of skin of other parts of face: Secondary | ICD-10-CM | POA: Diagnosis not present

## 2022-09-10 DIAGNOSIS — C44329 Squamous cell carcinoma of skin of other parts of face: Secondary | ICD-10-CM | POA: Diagnosis not present

## 2022-09-10 DIAGNOSIS — C9110 Chronic lymphocytic leukemia of B-cell type not having achieved remission: Secondary | ICD-10-CM | POA: Diagnosis not present

## 2022-09-10 DIAGNOSIS — Z85828 Personal history of other malignant neoplasm of skin: Secondary | ICD-10-CM | POA: Diagnosis not present

## 2022-09-10 NOTE — Progress Notes (Signed)
Remote pacemaker transmission.   

## 2022-09-17 DIAGNOSIS — L0889 Other specified local infections of the skin and subcutaneous tissue: Secondary | ICD-10-CM | POA: Diagnosis not present

## 2022-09-17 DIAGNOSIS — Z5181 Encounter for therapeutic drug level monitoring: Secondary | ICD-10-CM | POA: Diagnosis not present

## 2022-09-25 ENCOUNTER — Encounter: Payer: Self-pay | Admitting: Dermatology

## 2022-09-28 DIAGNOSIS — R059 Cough, unspecified: Secondary | ICD-10-CM | POA: Diagnosis not present

## 2022-09-28 DIAGNOSIS — J029 Acute pharyngitis, unspecified: Secondary | ICD-10-CM | POA: Diagnosis not present

## 2022-09-28 DIAGNOSIS — M5431 Sciatica, right side: Secondary | ICD-10-CM | POA: Diagnosis not present

## 2022-10-01 ENCOUNTER — Other Ambulatory Visit: Payer: Self-pay | Admitting: Physician Assistant

## 2022-10-01 ENCOUNTER — Other Ambulatory Visit: Payer: Self-pay | Admitting: Internal Medicine

## 2022-10-14 ENCOUNTER — Telehealth: Payer: Self-pay | Admitting: Interventional Cardiology

## 2022-10-14 MED ORDER — FUROSEMIDE 40 MG PO TABS: 40.0000 mg | ORAL_TABLET | Freq: Every day | ORAL | 0 refills | Status: DC

## 2022-10-14 MED ORDER — DIGOXIN 125 MCG PO TABS: 125.0000 ug | ORAL_TABLET | Freq: Every day | ORAL | 0 refills | Status: DC

## 2022-10-14 NOTE — Telephone Encounter (Signed)
*  STAT* If patient is at the pharmacy, call can be transferred to refill team.   1. Which medications need to be refilled? (please list name of each medication and dose if known)   digoxin (LANOXIN) 0.125 MG tablet  furosemide (LASIX) 40 MG tablet   2. Would you like to learn more about the convenience, safety, & potential cost savings by using the [facility]?   3. Are you open to using the [facility] (Type [facility]. ).  4. Which pharmacy/location (including street and city if local pharmacy) is medication to be sent to?  [facility] - [place], [place] - [address]   5. Do they need a 30 day or 90 day supply?  90 day  Daughter stated patient still has some medication.  Patient has appointment scheduled on 9/12.

## 2022-10-14 NOTE — Telephone Encounter (Signed)
Pt's medications were sent to pt's pharmacy as requested. Conformation received.

## 2022-10-21 ENCOUNTER — Ambulatory Visit: Payer: Medicare Other | Attending: Interventional Cardiology

## 2022-10-21 DIAGNOSIS — I480 Paroxysmal atrial fibrillation: Secondary | ICD-10-CM | POA: Diagnosis not present

## 2022-10-21 DIAGNOSIS — Z5181 Encounter for therapeutic drug level monitoring: Secondary | ICD-10-CM | POA: Diagnosis not present

## 2022-10-21 LAB — POCT INR: INR: 3.6 — AB (ref 2.0–3.0)

## 2022-10-21 NOTE — Patient Instructions (Signed)
Description   HOLD today's dose and then continue warfarin 1 tablet daily.  Recheck INR in 5 weeks.  Call if scheduled for any procedures or has any new medications  [phone]

## 2022-11-11 NOTE — Progress Notes (Unsigned)
Cardiology Office Note Date:  11/11/2022  Patient ID:  [name], DOB 1928-02-28, MRN [id number] PCP:  Robert Officer, PA  Cardiologist:  Dr. Eldridge [name] Electrophysiologist: Dr. Ladona [name]    Chief Complaint: *** annual device visit  History of Present Illness: Robert [name] is a 89 y.o. male with history of VHD w/AVR (bioprosthetic, 2015), CAD (PCI LCx 2011, CABG 2015), GERD, HN, HLD, Afib (MAZE and LAA clip 2015), tachy-brady w/PPM.  He comes in today to be seen for Dr. Ladona [name], last seen by him July 2022, he was feeling well, in Afib w/RVR and dig was added to his regime.  Felt to be volume OL and started on lasix.  I saw him Aug 2023 He is accompanied by his wife today He is doing well No bleeding or signs of bleeding on the warfarin. No CP, palpitations or cardiac awareness. Denies SOB No near syncope or syncope. His only mention today is orthopedic aches/pains, ambulating with a cane 100% AF >> VVI programming Rate controlled Dig level planned  He saw Dr. Eldridge [name] 07/02/22, doing OK, some degree of SOB, post nasal gtt, end of day edema, no changes were made.  *** warfarin, bleeding, who manages? [place]? *** rates *** VP%? *** dig level *** symptoms   Device information Abbott dual chamber PPM implanted 05/21/2020   Past Medical History:  Diagnosis Date   Aortic stenosis    a. s/p tissue AVR 05/2013;  b. Echo (06/2013):  Mod LVH, EF 60-65%, no RWMA, Gr 2 DD, AVR ok (mean 13 mmHg), MAC, mild MR, mod LAE, mild RAE, PASP 46 mmHg (mild pulmo HTN)   Blood loss anemia    a. 05/2009 a/w GIB.   CAD (coronary artery disease)    a. Cath 04/2009: BMS to LCx 04/2009; CTO of RCA with L-R collaterals, 40% ostial diag.   Carpal tunnel syndrome    bilateral, carpal tunnel release x 2   Esophageal ulcer    a. 05/2009 with hemorrhage - injected with epinephrine-Dr. Molly Maduro [name].   Esophagitis    a. 05/2009 a/w GIB.   GERD (gastroesophageal reflux disease)    EGD, Dr.  Tyler [name] 2008   GI bleed    a. 05/2009 as above: with associated anemia. Lower esophageal ulcer with extensive erosive esophagitis and large clot by EGD 05/2009.    Hx of colonoscopy 2008   Dr. Tyler [name], polyps- 5 yr f/u recommended   Hyperlipidemia    Hypertension    PAF (paroxysmal atrial fibrillation) (HCC)    a. Post-op AVR;  b. 04/2014 recurrent.   Perforated ear drum    right ear drum, tube in left ear drum, Dr. Pollyann [name]   Polymyalgia rheumatica [facility])    Prostate cancer [facility])    Vitamin D deficiency     Past Surgical History:  Procedure Laterality Date   AORTIC VALVE REPLACEMENT N/A 05/12/2013   Procedure: AORTIC VALVE REPLACEMENT (AVR);  Surgeon: Robert Perna, MD;  Location: [facility] OR;  Service: Open Heart Surgery;  Laterality: N/A;   CARDIAC CATHETERIZATION  2011   CARDIOVERSION N/A 04/12/2020   Procedure: CARDIOVERSION;  Surgeon: Robert Masson, MD;  Location: [facility] ENDOSCOPY;  Service: Cardiovascular;  Laterality: N/A;   CORONARY ARTERY BYPASS GRAFT N/A 05/12/2013   Procedure: CORONARY ARTERY BYPASS GRAFTING (CABG);  Surgeon: Robert Perna, MD;  Location: [facility] OR;  Service: Open Heart Surgery;  Laterality: N/A;  CABG x 1 using left leg greater saphenous vein harvested endoscopically   INTRAOPERATIVE TRANSESOPHAGEAL ECHOCARDIOGRAM  N/A 05/12/2013   Procedure: INTRAOPERATIVE TRANSESOPHAGEAL ECHOCARDIOGRAM;  Surgeon: Robert Perna, MD;  Location: [facility] OR;  Service: Open Heart Surgery;  Laterality: N/A;   LEFT AND RIGHT HEART CATHETERIZATION WITH CORONARY ANGIOGRAM N/A 05/09/2013   Procedure: LEFT AND RIGHT HEART CATHETERIZATION WITH CORONARY ANGIOGRAM;  Surgeon: Robert Gess, MD;  Location: [facility] CATH LAB;  Service: Cardiovascular;  Laterality: N/A;   MAZE N/A 05/12/2013   Procedure: MAZE;  Surgeon: Robert Perna, MD;  Location: [facility] OR;  Service: Open Heart Surgery;  Laterality: N/A;   PACEMAKER IMPLANT N/A 05/21/2020   Procedure: PACEMAKER IMPLANT;  Surgeon: Robert Maw,  MD;  Location: [facility] INVASIVE CV LAB;  Service: Cardiovascular;  Laterality: N/A;   PROSTATECTOMY     TEE WITHOUT CARDIOVERSION N/A 04/12/2020   Procedure: TRANSESOPHAGEAL ECHOCARDIOGRAM (TEE);  Surgeon: Robert Masson, MD;  Location: [facility] ENDOSCOPY;  Service: Cardiovascular;  Laterality: N/A;    Current Outpatient Medications  Medication Sig Dispense Refill   acetaminophen (TYLENOL) 325 MG tablet Take 650 mg by mouth every 6 (six) hours as needed for moderate pain.     Ascorbic Acid (VITAMIN C PO) Take 1 tablet by mouth 2 (two) times a week.     Cholecalciferol (DIALYVITE VITAMIN D 5000) 125 MCG (5000 UT) capsule Take 5,000 Units by mouth 2 (two) times a week.     digoxin (LANOXIN) 0.125 MG tablet Take 1 tablet (125 mcg total) by mouth daily. 90 tablet 0   furosemide (LASIX) 40 MG tablet Take 1 tablet (40 mg total) by mouth daily. 90 tablet 0   KLOR-CON M20 20 MEQ tablet Take 20 mEq by mouth daily.     losartan (COZAAR) 50 MG tablet TAKE 1 TABLET DAILY 90 tablet 3   metoprolol succinate (TOPROL-XL) 50 MG 24 hr tablet Take 1 tablet (50 mg total) by mouth in the morning and at bedtime. 180 tablet 3   omeprazole (PRILOSEC) 20 MG capsule Take 20 mg by mouth every morning.     rosuvastatin (CRESTOR) 20 MG tablet Take 1 tablet (20 mg total) by mouth daily. 90 tablet 3   warfarin (COUMADIN) 5 MG tablet TAKE 1 TABLET DAILY AS DIRECTED BY THE COUMADIN CLINIC. 100 tablet 1   No current facility-administered medications for this visit.    Allergies:   Amoxicillin, Lisinopril, and Kenalog [triamcinolone]   Social History:  The patient  reports that he has never smoked. He has never used smokeless tobacco. He reports that he does not drink alcohol and does not use drugs.   Family History:  The patient's family history includes Breast cancer in his daughter; CVA in his mother; Kidney disease in his father.  ROS:  Please see the history of present illness.    All other systems are reviewed and  otherwise negative.   PHYSICAL EXAM:  VS:  There were no vitals taken for this visit. BMI: There is no height or weight on file to calculate BMI. Well nourished, well developed, in no acute distress HEENT: normocephalic, atraumatic Neck: no JVD, carotid bruits or masses Cardiac: *** irreg-irreg; no significant murmurs, no rubs, or gallops Lungs: *** CTA b/l, no wheezing, rhonchi or rales Abd: soft, nontender MS: no deformity, age appropriate atrophy Ext: *** no edema Skin: warm and dry, no rash Neuro:  No gross deficits appreciated Psych: euthymic mood, full affect  *** PPM site is stable, no tethering or discomfort   EKG:  done today and reviewed by myself ***  Device interrogation  done today and reviewed by myself:  Battery and lead measurements are good ***  05/28/2021: TTE  1. Left ventricular ejection fraction, by estimation, is 55 to 60%. The  left ventricle has normal function. The left ventricle has no regional  wall motion abnormalities. There is moderate asymmetric left ventricular  hypertrophy of the basal segment.  Left ventricular diastolic parameters are indeterminate.   2. Right ventricular systolic function is normal. The right ventricular  size is normal. There is moderately elevated pulmonary artery systolic  pressure.   3. Left atrial size was severely dilated.   4. Right atrial size was severely dilated.   5. No evidence of mitral valve regurgitation. Moderate mitral annular  calcification.   6. Tricuspid valve regurgitation is mild to moderate.   7. 23 mm Magna Ease Pericardial Valve. Mean AV gradient 10.2 mmHg. DI  0.39. Well seated. Procedure date 2015. No paravalvular leak. Normal  prosthesis. The aortic valve is grossly normal. Aortic valve regurgitation  is not visualized.   8. Aortic small ascending aortic aneurysm 4.1 cm.   9. The inferior vena cava is dilated in size with >50% respiratory  variability, suggesting right atrial pressure of 8  mmHg.   Conclusion(s)/Recommendation(s): No significant changes from TEE  04/12/2020.   Recent Labs: 12/11/2021: ALT 10; BUN 18; Creatinine 0.85; Hemoglobin 9.8; Platelet Count 174; Potassium 4.3; Sodium 141  No results found for requested labs within last 365 days.   CrCl cannot be calculated (Patient's most recent lab result is older than the maximum 21 days allowed.).   Wt Readings from Last 3 Encounters:  07/02/22 166 lb (75.3 kg)  12/11/21 170 lb 12.8 oz (77.5 kg)  10/11/21 169 lb (76.7 kg)     Other studies reviewed: Additional studies/records reviewed today include: summarized above  ASSESSMENT AND PLAN:  PPM *** intact function *** no programming changes made  Permanent Afib CHA2DS2Vasc is 4, on warfarin, monitored and managed by *** *** controlled rates *** dig  CAD (CABG) *** No anginal symptoms *** On BB, statin, no ASA with warfarin C/w Dr. Janora Norlander  VHD, s/p AVR Bioprosthetic Functioning well *** No murmur on exam C/w Dr. Janora Norlander  HTN *** Looks good No changes  Chronic CHF HFpEF *** No symptoms or exam findings of volume OL C/w Dr. Janora Norlander  Disposition: ***   Current medicines are reviewed at length with the patient today.  The patient did not have any concerns regarding medicines.  Norma Fredrickson, PA-C 11/11/2022 1:46 PM     [facility] [address] [place] [address] [phone] (office)  [phone] (fax)

## 2022-11-13 ENCOUNTER — Ambulatory Visit: Payer: Medicare Other | Attending: Physician Assistant | Admitting: Physician Assistant

## 2022-11-13 ENCOUNTER — Encounter: Payer: Self-pay | Admitting: Physician Assistant

## 2022-11-13 VITALS — BP 156/63 | HR 63 | Ht 69.0 in | Wt 165.2 lb

## 2022-11-13 DIAGNOSIS — I5032 Chronic diastolic (congestive) heart failure: Secondary | ICD-10-CM | POA: Insufficient documentation

## 2022-11-13 DIAGNOSIS — Z79899 Other long term (current) drug therapy: Secondary | ICD-10-CM | POA: Diagnosis not present

## 2022-11-13 DIAGNOSIS — I4821 Permanent atrial fibrillation: Secondary | ICD-10-CM | POA: Diagnosis not present

## 2022-11-13 DIAGNOSIS — Z950 Presence of cardiac pacemaker: Secondary | ICD-10-CM | POA: Insufficient documentation

## 2022-11-13 DIAGNOSIS — Z952 Presence of prosthetic heart valve: Secondary | ICD-10-CM | POA: Insufficient documentation

## 2022-11-13 DIAGNOSIS — I10 Essential (primary) hypertension: Secondary | ICD-10-CM | POA: Diagnosis not present

## 2022-11-13 DIAGNOSIS — I2510 Atherosclerotic heart disease of native coronary artery without angina pectoris: Secondary | ICD-10-CM | POA: Insufficient documentation

## 2022-11-13 DIAGNOSIS — R0981 Nasal congestion: Secondary | ICD-10-CM | POA: Diagnosis not present

## 2022-11-13 DIAGNOSIS — R059 Cough, unspecified: Secondary | ICD-10-CM | POA: Diagnosis not present

## 2022-11-13 LAB — CUP PACEART INCLINIC DEVICE CHECK
Battery Remaining Longevity: 112 mo
Battery Voltage: 3.05 V
Brady Statistic RA Percent Paced: 0 %
Brady Statistic RV Percent Paced: 19 %
Date Time Interrogation Session: 20240912082500
Implantable Lead Connection Status: 753985
Implantable Lead Connection Status: 753985
Implantable Lead Implant Date: 20220321
Implantable Lead Implant Date: 20220321
Implantable Lead Location: 753859
Implantable Lead Location: 753860
Implantable Pulse Generator Implant Date: 20220321
Lead Channel Impedance Value: 462.5 Ohm
Lead Channel Pacing Threshold Amplitude: 0.75 V
Lead Channel Pacing Threshold Amplitude: 0.75 V
Lead Channel Pacing Threshold Pulse Width: 0.4 ms
Lead Channel Pacing Threshold Pulse Width: 0.4 ms
Lead Channel Sensing Intrinsic Amplitude: 10.1 mV
Lead Channel Sensing Intrinsic Amplitude: 2.7 mV
Lead Channel Setting Pacing Amplitude: 2.5 V
Lead Channel Setting Pacing Pulse Width: 0.4 ms
Lead Channel Setting Sensing Sensitivity: 2 mV
Pulse Gen Model: 2272
Pulse Gen Serial Number: 3909458

## 2022-11-13 NOTE — Patient Instructions (Signed)
Medication Instructions:   Your physician recommends that you continue on your current medications as directed. Please refer to the Current Medication list given to you today.  *If you need a refill on your cardiac medications before your next appointment, please call your pharmacy*   Lab Work:  BMET DIGOXIN AND CBC TODAY   If you have labs (blood work) drawn today and your tests are completely normal, you will receive your results only by: MyChart Message (if you have MyChart) OR A paper copy in the mail If you have any lab test that is abnormal or we need to change your treatment, we will call you to review the results.   Testing/Procedures: NONE ORDERED  TODAY     Follow-Up: At [facility], you and your health needs are our priority.  As part of our continuing mission to provide you with exceptional heart care, we have created designated Provider Care Teams.  These Care Teams include your primary Cardiologist (physician) and Advanced Practice Providers (APPs -  Physician Assistants and Nurse Practitioners) who all work together to provide you with the care you need, when you need it.  We recommend signing up for the patient portal called "MyChart".  Sign up information is provided on this After Visit Summary.  MyChart is used to connect with patients for Virtual Visits (Telemedicine).  Patients are able to view lab/test results, encounter notes, upcoming appointments, etc.  Non-urgent messages can be sent to your provider as well.   To learn more about what you can do with MyChart, go to [email or web address].    Your next appointment:    1 year(s)  Provider:    You may see Dr. Ladona Ridgel  or one of the following Advanced Practice Providers on your designated Care Team:   Francis Dowse, [place]    Other Instructions

## 2022-11-14 LAB — CBC
Hematocrit: 29.7 % — ABNORMAL LOW (ref 37.5–51.0)
Hemoglobin: 9.8 g/dL — ABNORMAL LOW (ref 13.0–17.7)
MCH: 32.8 pg (ref 26.6–33.0)
MCHC: 33 g/dL (ref 31.5–35.7)
MCV: 99 fL — ABNORMAL HIGH (ref 79–97)
Platelets: 185 10*3/uL (ref 150–450)
RBC: 2.99 x10E6/uL — ABNORMAL LOW (ref 4.14–5.80)
RDW: 15.4 % (ref 11.6–15.4)
WBC: 11.4 10*3/uL — ABNORMAL HIGH (ref 3.4–10.8)

## 2022-11-14 LAB — BASIC METABOLIC PANEL
BUN/Creatinine Ratio: 20 (ref 10–24)
BUN: 13 mg/dL (ref 10–36)
CO2: 26 mmol/L (ref 20–29)
Calcium: 9.1 mg/dL (ref 8.6–10.2)
Chloride: 106 mmol/L (ref 96–106)
Creatinine, Ser: 0.65 mg/dL — ABNORMAL LOW (ref 0.76–1.27)
Glucose: 99 mg/dL (ref 70–99)
Potassium: 3.9 mmol/L (ref 3.5–5.2)
Sodium: 146 mmol/L — ABNORMAL HIGH (ref 134–144)
eGFR: 88 mL/min/{1.73_m2} (ref >59)

## 2022-11-14 LAB — DIGOXIN LEVEL: Digoxin, Serum: 0.7 ng/mL (ref 0.5–0.9)

## 2022-11-18 ENCOUNTER — Ambulatory Visit (INDEPENDENT_AMBULATORY_CARE_PROVIDER_SITE_OTHER): Payer: Medicare Other

## 2022-11-18 DIAGNOSIS — I495 Sick sinus syndrome: Secondary | ICD-10-CM | POA: Diagnosis not present

## 2022-11-18 LAB — CUP PACEART REMOTE DEVICE CHECK
Battery Remaining Longevity: 110 mo
Battery Remaining Percentage: 82 %
Battery Voltage: 3.04 V
Brady Statistic RV Percent Paced: 5.5 %
Date Time Interrogation Session: 20240917020019
Implantable Lead Connection Status: 753985
Implantable Lead Connection Status: 753985
Implantable Lead Implant Date: 20220321
Implantable Lead Implant Date: 20220321
Implantable Lead Location: 753859
Implantable Lead Location: 753860
Implantable Pulse Generator Implant Date: 20220321
Lead Channel Impedance Value: 460 Ohm
Lead Channel Pacing Threshold Amplitude: 0.75 V
Lead Channel Pacing Threshold Pulse Width: 0.4 ms
Lead Channel Sensing Intrinsic Amplitude: 9.4 mV
Lead Channel Setting Pacing Amplitude: 2.5 V
Lead Channel Setting Pacing Pulse Width: 0.4 ms
Lead Channel Setting Sensing Sensitivity: 2 mV
Pulse Gen Model: 2272
Pulse Gen Serial Number: 3909458

## 2022-11-25 ENCOUNTER — Ambulatory Visit: Payer: Medicare Other | Attending: Interventional Cardiology

## 2022-11-25 DIAGNOSIS — Z5181 Encounter for therapeutic drug level monitoring: Secondary | ICD-10-CM | POA: Diagnosis not present

## 2022-11-25 DIAGNOSIS — I4819 Other persistent atrial fibrillation: Secondary | ICD-10-CM | POA: Diagnosis not present

## 2022-11-25 DIAGNOSIS — I480 Paroxysmal atrial fibrillation: Secondary | ICD-10-CM | POA: Diagnosis not present

## 2022-11-25 LAB — POCT INR: INR: 3.4 — AB (ref 2.0–3.0)

## 2022-11-25 NOTE — Patient Instructions (Signed)
Description   HOLD today's dose and then continue warfarin 1 tablet daily.  Recheck INR in 5 weeks.  Call if scheduled for any procedures or has any new medications  [phone]

## 2022-12-04 NOTE — Progress Notes (Signed)
Remote pacemaker transmission.   

## 2022-12-11 ENCOUNTER — Telehealth: Payer: Self-pay | Admitting: Hematology

## 2022-12-11 NOTE — Telephone Encounter (Signed)
Left patient a message in regards to scheduled appointment times/dates; left call back number if needed for rescheduling

## 2022-12-18 DIAGNOSIS — C61 Malignant neoplasm of prostate: Secondary | ICD-10-CM | POA: Diagnosis not present

## 2022-12-29 DIAGNOSIS — R051 Acute cough: Secondary | ICD-10-CM | POA: Diagnosis not present

## 2022-12-29 DIAGNOSIS — D6869 Other thrombophilia: Secondary | ICD-10-CM | POA: Diagnosis not present

## 2022-12-29 DIAGNOSIS — K219 Gastro-esophageal reflux disease without esophagitis: Secondary | ICD-10-CM | POA: Diagnosis not present

## 2022-12-29 DIAGNOSIS — Z8546 Personal history of malignant neoplasm of prostate: Secondary | ICD-10-CM | POA: Diagnosis not present

## 2022-12-29 DIAGNOSIS — H6691 Otitis media, unspecified, right ear: Secondary | ICD-10-CM | POA: Diagnosis not present

## 2022-12-29 DIAGNOSIS — Z03818 Encounter for observation for suspected exposure to other biological agents ruled out: Secondary | ICD-10-CM | POA: Diagnosis not present

## 2022-12-29 DIAGNOSIS — I2510 Atherosclerotic heart disease of native coronary artery without angina pectoris: Secondary | ICD-10-CM | POA: Diagnosis not present

## 2022-12-29 DIAGNOSIS — I10 Essential (primary) hypertension: Secondary | ICD-10-CM | POA: Diagnosis not present

## 2022-12-29 DIAGNOSIS — I5042 Chronic combined systolic (congestive) and diastolic (congestive) heart failure: Secondary | ICD-10-CM | POA: Diagnosis not present

## 2022-12-29 DIAGNOSIS — I890 Lymphedema, not elsewhere classified: Secondary | ICD-10-CM | POA: Diagnosis not present

## 2022-12-29 DIAGNOSIS — C8590 Non-Hodgkin lymphoma, unspecified, unspecified site: Secondary | ICD-10-CM | POA: Diagnosis not present

## 2022-12-29 DIAGNOSIS — I4891 Unspecified atrial fibrillation: Secondary | ICD-10-CM | POA: Diagnosis not present

## 2022-12-29 DIAGNOSIS — Z0000 Encounter for general adult medical examination without abnormal findings: Secondary | ICD-10-CM | POA: Diagnosis not present

## 2022-12-30 ENCOUNTER — Other Ambulatory Visit: Payer: Self-pay | Admitting: Interventional Cardiology

## 2022-12-30 ENCOUNTER — Ambulatory Visit: Payer: Medicare Other | Attending: Cardiology

## 2022-12-30 DIAGNOSIS — I480 Paroxysmal atrial fibrillation: Secondary | ICD-10-CM | POA: Diagnosis not present

## 2022-12-30 DIAGNOSIS — Z5181 Encounter for therapeutic drug level monitoring: Secondary | ICD-10-CM | POA: Diagnosis not present

## 2022-12-30 LAB — POCT INR: INR: 2.8 (ref 2.0–3.0)

## 2022-12-30 NOTE — Patient Instructions (Signed)
Description   Eat a salad tomorrow and continue warfarin 1 tablet daily.  Recheck INR in 6 weeks.  Call if scheduled for any procedures or has any new medications  [phone]

## 2022-12-31 ENCOUNTER — Other Ambulatory Visit: Payer: Self-pay

## 2022-12-31 DIAGNOSIS — C9110 Chronic lymphocytic leukemia of B-cell type not having achieved remission: Secondary | ICD-10-CM

## 2023-01-02 ENCOUNTER — Inpatient Hospital Stay: Payer: Medicare Other | Attending: Hematology

## 2023-01-02 ENCOUNTER — Inpatient Hospital Stay: Payer: Medicare Other | Admitting: Hematology

## 2023-01-02 VITALS — BP 136/66 | HR 71 | Temp 97.5°F | Resp 16 | Wt 161.2 lb

## 2023-01-02 DIAGNOSIS — Z7901 Long term (current) use of anticoagulants: Secondary | ICD-10-CM | POA: Diagnosis not present

## 2023-01-02 DIAGNOSIS — Z79899 Other long term (current) drug therapy: Secondary | ICD-10-CM | POA: Insufficient documentation

## 2023-01-02 DIAGNOSIS — C9110 Chronic lymphocytic leukemia of B-cell type not having achieved remission: Secondary | ICD-10-CM | POA: Insufficient documentation

## 2023-01-02 DIAGNOSIS — M353 Polymyalgia rheumatica: Secondary | ICD-10-CM | POA: Insufficient documentation

## 2023-01-02 DIAGNOSIS — E785 Hyperlipidemia, unspecified: Secondary | ICD-10-CM | POA: Diagnosis not present

## 2023-01-02 DIAGNOSIS — R161 Splenomegaly, not elsewhere classified: Secondary | ICD-10-CM | POA: Insufficient documentation

## 2023-01-02 DIAGNOSIS — I350 Nonrheumatic aortic (valve) stenosis: Secondary | ICD-10-CM | POA: Insufficient documentation

## 2023-01-02 DIAGNOSIS — R599 Enlarged lymph nodes, unspecified: Secondary | ICD-10-CM | POA: Insufficient documentation

## 2023-01-02 DIAGNOSIS — I480 Paroxysmal atrial fibrillation: Secondary | ICD-10-CM | POA: Insufficient documentation

## 2023-01-02 DIAGNOSIS — Z1509 Genetic susceptibility to other malignant neoplasm: Secondary | ICD-10-CM | POA: Insufficient documentation

## 2023-01-02 DIAGNOSIS — C61 Malignant neoplasm of prostate: Secondary | ICD-10-CM | POA: Insufficient documentation

## 2023-01-02 DIAGNOSIS — Z803 Family history of malignant neoplasm of breast: Secondary | ICD-10-CM | POA: Insufficient documentation

## 2023-01-02 DIAGNOSIS — I2510 Atherosclerotic heart disease of native coronary artery without angina pectoris: Secondary | ICD-10-CM | POA: Insufficient documentation

## 2023-01-02 DIAGNOSIS — I10 Essential (primary) hypertension: Secondary | ICD-10-CM | POA: Insufficient documentation

## 2023-01-02 LAB — CMP (CANCER CENTER ONLY)
ALT: 12 U/L (ref 0–44)
AST: 16 U/L (ref 15–41)
Albumin: 4.3 g/dL (ref 3.5–5.0)
Alkaline Phosphatase: 32 U/L — ABNORMAL LOW (ref 38–126)
Anion gap: 6 (ref 5–15)
BUN: 18 mg/dL (ref 8–23)
CO2: 33 mmol/L — ABNORMAL HIGH (ref 22–32)
Calcium: 9.2 mg/dL (ref 8.9–10.3)
Chloride: 103 mmol/L (ref 98–111)
Creatinine: 0.84 mg/dL (ref 0.61–1.24)
GFR, Estimated: >60 mL/min (ref >60)
Glucose, Bld: 105 mg/dL — ABNORMAL HIGH (ref 70–99)
Potassium: 3.6 mmol/L (ref 3.5–5.1)
Sodium: 142 mmol/L (ref 135–145)
Total Bilirubin: 0.7 mg/dL (ref 0.3–1.2)
Total Protein: 6.7 g/dL (ref 6.5–8.1)

## 2023-01-02 LAB — CBC WITH DIFFERENTIAL (CANCER CENTER ONLY)
Abs Immature Granulocytes: 0.06 10*3/uL (ref 0.00–0.07)
Basophils Absolute: 0.1 10*3/uL (ref 0.0–0.1)
Basophils Relative: 1 %
Eosinophils Absolute: 0.2 10*3/uL (ref 0.0–0.5)
Eosinophils Relative: 2 %
HCT: 31.6 % — ABNORMAL LOW (ref 39.0–52.0)
Hemoglobin: 10.3 g/dL — ABNORMAL LOW (ref 13.0–17.0)
Immature Granulocytes: 1 %
Lymphocytes Relative: 39 %
Lymphs Abs: 4 10*3/uL (ref 0.7–4.0)
MCH: 32 pg (ref 26.0–34.0)
MCHC: 32.6 g/dL (ref 30.0–36.0)
MCV: 98.1 fL (ref 80.0–100.0)
Monocytes Absolute: 0.7 10*3/uL (ref 0.1–1.0)
Monocytes Relative: 7 %
Neutro Abs: 5.1 10*3/uL (ref 1.7–7.7)
Neutrophils Relative %: 50 %
Platelet Count: 181 10*3/uL (ref 150–400)
RBC: 3.22 MIL/uL — ABNORMAL LOW (ref 4.22–5.81)
RDW: 15 % (ref 11.5–15.5)
WBC Count: 10.1 10*3/uL (ref 4.0–10.5)
nRBC: 0 % (ref 0.0–0.2)

## 2023-01-02 LAB — LACTATE DEHYDROGENASE: LDH: 183 U/L (ref 98–192)

## 2023-01-02 NOTE — Progress Notes (Signed)
[name]   HEMATOLOGY/ONCOLOGY CLINIC VISIT NOTE  Date of Service: 01/02/2023  Patient Care Team: Delma Officer, PA as PCP - General (Internal Medicine) Corky Crafts, MD as PCP - Cardiology (Cardiology)  CHIEF COMPLAINTS/PURPOSE OF CONSULTATION:  F/u for small lymphocytic lmyphoma  HISTORY OF PRESENTING ILLNESS:   Robert [name] is a wonderful 89 y.o. male who has been referred to [place] by Dr .Marlon Pel, [name], PA/[name], MD for evaluation and management of small lymphocytic lymphoma.  He was last seen by me on 08/21/2021 and was doing well.   He is here with his daughter and his wife and he is doing well overall. His daughter states that he has fluid in his ears which has affected his hearing aids and causing him hearing problems. He denies fever, chill, new infection, changes in breathing, abdominal pain, and back pain.  He notes he has good appetite with steady weight.   His wife reports he occasionally has night sweat, which has been going on for a long time and has noting to do with CLL. He reports that he his having occasional leg pain as well. He notes that his legs swell up at night, but not during the day.   His daughter reports that he is positive for germline brca2 mutation.  He reports he has prostate cancer, which is getting treated for it. He is receiving leuprolide every 6 months.    His family history only has prostate cancer.   He is planning on receiving influenza vaccine, RSV vaccine, and the COVID-19 booster.   INTERVAL HISTORY: Robert [name] is a wonderful 89 y.o. male who is connected via phone for evaluation and management of small lymphocytic lymphoma. He was last seen by me on 04/21/2022 and was doing well overall with no new medical concerns.   Today, he is accompanied by his wife and daughter. Patient complains of a recent cough/cold. He was given antibiotics on Monday and continues to endorse some phlegm in his throat.   He  reports that he first noticed bilateral swelling in the nodule of the groin 3 weeks ago.The swelling is intermittent. His daughter reports that his groin swelling may have been present for 4 weeks. He has no other new lumps/bumps. Patient reports that the swelling in his groin is not bothersome and he would not like to consider treatment at this time.  There are a few palpable lymph nodes in the neck, notable lymph node in the axillary, and slightly enlarged spleen on physical examination. He denies any abdominal pain at this time.   His leg swelling has been stable for several years but has worsened recently. Patient had used compression socks but stopped 6 months ago as he could no longer fit them.   His wife reports that patient has felt warm at night with mild sweating over the last 6 months. She reports that his temperature reading are normal. She denies any concerns of significant drenching night sweats.   Patient has a history of stable painful ankles and regularly wears ankle braces. He complains of weakness in his legs when walking.   He reports decreased appetite. Patient had lost 20 pounds last year, likely involving a significant amount of muscle mass. His current weight is 161 pounds. His daughter reports that his weight loss occurred suddenly over the course of 6 months. Patient reports that his weight had previously been stable at 172 pounds for a while. He reports that he had not noticed his decreased  appetite until he noticed his weight loss. His wife reports that patient's p.o. intake has recently improved this week. His daughter reports that patient recently started using Boost/Ensure to support muscle strength.   His daughter reports that he previously had a squamous cell carcinoma lesion removed from the right side.   MEDICAL HISTORY:  Past Medical History:  Diagnosis Date   Aortic stenosis    a. s/p tissue AVR 05/2013;  b. Echo (06/2013):  Mod LVH, EF 60-65%, no RWMA, Gr 2 DD,  AVR ok (mean 13 mmHg), MAC, mild MR, mod LAE, mild RAE, PASP 46 mmHg (mild pulmo HTN)   Blood loss anemia    a. 05/2009 a/w GIB.   CAD (coronary artery disease)    a. Cath 04/2009: BMS to LCx 04/2009; CTO of RCA with L-R collaterals, 40% ostial diag.   Carpal tunnel syndrome    bilateral, carpal tunnel release x 2   Esophageal ulcer    a. 05/2009 with hemorrhage - injected with epinephrine-Dr. Molly Maduro Buccini.   Esophagitis    a. 05/2009 a/w GIB.   GERD (gastroesophageal reflux disease)    EGD, Dr. Tyler Aas 2008   GI bleed    a. 05/2009 as above: with associated anemia. Lower esophageal ulcer with extensive erosive esophagitis and large clot by EGD 05/2009.    Hx of colonoscopy 2008   Dr. Tyler Aas, polyps- 5 yr f/u recommended   Hyperlipidemia    Hypertension    PAF (paroxysmal atrial fibrillation) (HCC)    a. Post-op AVR;  b. 04/2014 recurrent.   Perforated ear drum    right ear drum, tube in left ear drum, Dr. Pollyann Kennedy   Polymyalgia rheumatica [facility])    Prostate cancer [facility])    Vitamin D deficiency     SURGICAL HISTORY: Past Surgical History:  Procedure Laterality Date   AORTIC VALVE REPLACEMENT N/A 05/12/2013   Procedure: AORTIC VALVE REPLACEMENT (AVR);  Surgeon: Kerin Perna, MD;  Location: [facility] OR;  Service: Open Heart Surgery;  Laterality: N/A;   CARDIAC CATHETERIZATION  2011   CARDIOVERSION N/A 04/12/2020   Procedure: CARDIOVERSION;  Surgeon: Lars Masson, MD;  Location: [facility] ENDOSCOPY;  Service: Cardiovascular;  Laterality: N/A;   CORONARY ARTERY BYPASS GRAFT N/A 05/12/2013   Procedure: CORONARY ARTERY BYPASS GRAFTING (CABG);  Surgeon: Kerin Perna, MD;  Location: [facility] OR;  Service: Open Heart Surgery;  Laterality: N/A;  CABG x 1 using left leg greater saphenous vein harvested endoscopically   INTRAOPERATIVE TRANSESOPHAGEAL ECHOCARDIOGRAM N/A 05/12/2013   Procedure: INTRAOPERATIVE TRANSESOPHAGEAL ECHOCARDIOGRAM;  Surgeon: Kerin Perna, MD;  Location: [facility] OR;   Service: Open Heart Surgery;  Laterality: N/A;   LEFT AND RIGHT HEART CATHETERIZATION WITH CORONARY ANGIOGRAM N/A 05/09/2013   Procedure: LEFT AND RIGHT HEART CATHETERIZATION WITH CORONARY ANGIOGRAM;  Surgeon: Runell Gess, MD;  Location: [facility] CATH LAB;  Service: Cardiovascular;  Laterality: N/A;   MAZE N/A 05/12/2013   Procedure: MAZE;  Surgeon: Kerin Perna, MD;  Location: [facility] OR;  Service: Open Heart Surgery;  Laterality: N/A;   PACEMAKER IMPLANT N/A 05/21/2020   Procedure: PACEMAKER IMPLANT;  Surgeon: Marinus Maw, MD;  Location: [facility] INVASIVE CV LAB;  Service: Cardiovascular;  Laterality: N/A;   PROSTATECTOMY     TEE WITHOUT CARDIOVERSION N/A 04/12/2020   Procedure: TRANSESOPHAGEAL ECHOCARDIOGRAM (TEE);  Surgeon: Lars Masson, MD;  Location: [facility] ENDOSCOPY;  Service: Cardiovascular;  Laterality: N/A;    SOCIAL HISTORY: Social History   Socioeconomic History   Marital status:  Married    Spouse name: Not on file   Number of children: Not on file   Years of education: Not on file   Highest education level: Not on file  Occupational History   Not on file  Tobacco Use   Smoking status: Never   Smokeless tobacco: Never  Vaping Use   Vaping status: Never Used  Substance and Sexual Activity   Alcohol use: No   Drug use: No   Sexual activity: Not on file  Other Topics Concern   Not on file  Social History Narrative   Not on file   Social Determinants of Health   Financial Resource Strain: Not on file  Food Insecurity: Not on file  Transportation Needs: Not on file  Physical Activity: Not on file  Stress: Not on file  Social Connections: Not on file  Intimate Partner Violence: Not on file    FAMILY HISTORY: Family History  Problem Relation Age of Onset   CVA Mother    Kidney disease Father    Breast cancer Daughter     ALLERGIES:  is allergic to amoxicillin, lisinopril, and kenalog [triamcinolone].  MEDICATIONS:  Current Outpatient Medications  Medication  Sig Dispense Refill   acetaminophen (TYLENOL) 325 MG tablet Take 650 mg by mouth every 6 (six) hours as needed for moderate pain.     Ascorbic Acid (VITAMIN C PO) Take 1 tablet by mouth 2 (two) times a week.     Cholecalciferol (DIALYVITE VITAMIN D 5000) 125 MCG (5000 UT) capsule Take 5,000 Units by mouth 2 (two) times a week.     digoxin (LANOXIN) 0.125 MG tablet Take 1 tablet (125 mcg total) by mouth daily. 90 tablet 0   furosemide (LASIX) 40 MG tablet Take 1 tablet (40 mg total) by mouth daily. 90 tablet 0   KLOR-CON M20 20 MEQ tablet Take 20 mEq by mouth daily.     losartan (COZAAR) 50 MG tablet TAKE 1 TABLET DAILY 90 tablet 3   metoprolol succinate (TOPROL-XL) 50 MG 24 hr tablet Take 1 tablet (50 mg total) by mouth in the morning and at bedtime. 180 tablet 3   omeprazole (PRILOSEC) 20 MG capsule Take 20 mg by mouth every morning.     rosuvastatin (CRESTOR) 20 MG tablet Take 1 tablet (20 mg total) by mouth daily. 90 tablet 3   warfarin (COUMADIN) 5 MG tablet TAKE 1 TABLET DAILY AS DIRECTED BY THE COUMADIN CLINIC. 100 tablet 3   No current facility-administered medications for this visit.    REVIEW OF SYSTEMS:    10 Point review of Systems was done is negative except as noted above.   PHYSICAL EXAMINATION: .BP 136/66   Pulse 71   Temp (!) 97.5 F (36.4 C)   Resp 16   Wt 161 lb 3.2 oz (73.1 kg)   SpO2 96%   BMI 23.81 kg/m   GENERAL:alert, in no acute distress and comfortable SKIN: no acute rashes, no significant lesions EYES: conjunctiva are pink and non-injected, sclera anicteric OROPHARYNX: MMM, no exudates, no oropharyngeal erythema or ulceration NECK: supple, no JVD LYMPH:  no palpable lymphadenopathy in the cervical, axillary or inguinal regions LUNGS: clear to auscultation b/l with normal respiratory effort HEART: regular rate & rhythm ABDOMEN:  normoactive bowel sounds , non tender, not distended. Extremity: no pedal edema PSYCH: alert & oriented x 3 with fluent  speech NEURO: no focal motor/sensory deficits   LABORATORY DATA:  I have reviewed the data as listed  .  Latest Ref Rng & Units 01/02/2023    2:34 PM 11/13/2022    8:24 AM 12/11/2021    1:00 PM  CBC  WBC 4.0 - 10.5 K/uL 10.1  11.4  12.4   Hemoglobin 13.0 - 17.0 g/dL 40.9  9.8  9.8   Hematocrit 39.0 - 52.0 % 31.6  29.7  29.8   Platelets 150 - 400 K/uL 181  185  174     .    Latest Ref Rng & Units 01/02/2023    2:34 PM 11/13/2022    8:24 AM 12/11/2021    1:00 PM  CMP  Glucose 70 - 99 mg/dL 811  99  914   BUN 8 - 23 mg/dL 18  13  18    Creatinine 0.61 - 1.24 mg/dL 7.82  9.56  2.13   Sodium 135 - 145 mmol/L 142  146  141   Potassium 3.5 - 5.1 mmol/L 3.6  3.9  4.3   Chloride 98 - 111 mmol/L 103  106  107   CO2 22 - 32 mmol/L 33  26  27   Calcium 8.9 - 10.3 mg/dL 9.2  9.1  9.0   Total Protein 6.5 - 8.1 g/dL 6.7   6.4   Total Bilirubin 0.3 - 1.2 mg/dL 0.7   0.7   Alkaline Phos 38 - 126 U/L 32   31   AST 15 - 41 U/L 16   14   ALT 0 - 44 U/L 12   10      RADIOGRAPHIC STUDIES: I have personally reviewed the radiological images as listed and agreed with the findings in the report. No results found.  ASSESSMENT & PLAN:   89 year old male with multiple medical comorbidities as noted above with:  #1Small lymphocytic lymphoma/chronic lymphocytic leukemia  #2 h/o Prostate cancer -following with urology. On lupron q6 months   PLAN:  -Discussed lab results on 01/02/23 in detail with patient. CBC stable, showed total WBC normal at 10.1K, hemoglobin of 10.3, and platelets of 181K. -Previous slight elevation of lymphocytes have now improved -there does appear to be some swelling from lymph nodes in the groin as well as some lymphedema in the skin of the area as well.  -discussed that there may be a role for local radiation to open up the lymph node so that the lymphatics may drain better -discussed that if his symptoms are bothersome enough to consider treatment down the line,  targeted therapies such as Venetoclax would be considered.  -discussed option of CD20 targeting antibody treatment if needed. Discussed potential risks of reactions and infections.  -discussed that there is some element of leg swelling from Venous insufficiency -recommend wearing loose clothing to address groin swelling  -discussed options to use zip-up compression socks to improve leg swelling -his last PET scan was in September 2023. Discussed option to re-evaluate his status with a repeat PET scan, which patient is not interested in at this time as his symptoms are not significantly bothersome. -continue to stay regularly active through walking and eating as much as he can to support muscle strength -recommend optimizing protein in order to not lose too much muscle mass -patient shall return to clinic in 4-6 months  -answered all of patient's and his family member's questions in detail.   FOLLOW-UP: RTC with Dr Candise Che with labs in 4 months  The total time spent in the appointment was 30 minutes* .  All of the patient's questions were answered with apparent satisfaction. The patient  knows to call the clinic with any problems, questions or concerns.   Wyvonnia Lora MD MS AAHIVMS [facility] Hematology/Oncology Physician [facility]  .*Total Encounter Time as defined by the Centers for Medicare and Medicaid Services includes, in addition to the face-to-face time of a patient visit (documented in the note above) non-face-to-face time: obtaining and reviewing outside history, ordering and reviewing medications, tests or procedures, care coordination (communications with other health care professionals or caregivers) and documentation in the medical record.    I,Mitra Faeizi,acting as a Neurosurgeon for Wyvonnia Lora, MD.,have documented all relevant documentation on the behalf of Wyvonnia Lora, MD,as directed by  Wyvonnia Lora, MD while in the presence of Wyvonnia Lora, MD.  .I have reviewed the  above documentation for accuracy and completeness, and I agree with the above. Johney Maine MD

## 2023-01-08 DIAGNOSIS — Z23 Encounter for immunization: Secondary | ICD-10-CM | POA: Diagnosis not present

## 2023-01-26 DIAGNOSIS — D485 Neoplasm of uncertain behavior of skin: Secondary | ICD-10-CM | POA: Diagnosis not present

## 2023-01-26 DIAGNOSIS — C44229 Squamous cell carcinoma of skin of left ear and external auricular canal: Secondary | ICD-10-CM | POA: Diagnosis not present

## 2023-01-26 DIAGNOSIS — C4442 Squamous cell carcinoma of skin of scalp and neck: Secondary | ICD-10-CM | POA: Diagnosis not present

## 2023-01-26 DIAGNOSIS — Z85828 Personal history of other malignant neoplasm of skin: Secondary | ICD-10-CM | POA: Diagnosis not present

## 2023-01-26 DIAGNOSIS — C44529 Squamous cell carcinoma of skin of other part of trunk: Secondary | ICD-10-CM | POA: Diagnosis not present

## 2023-01-26 DIAGNOSIS — L821 Other seborrheic keratosis: Secondary | ICD-10-CM | POA: Diagnosis not present

## 2023-02-01 NOTE — Progress Notes (Unsigned)
Cardiology Office Note    Date:  02/02/2023  ID:  Robert [name], DOB 06/16/1927, MRN [id number] PCP:  Robert Officer, PA  Cardiologist:  Robert Muss, MD  Electrophysiologist:  Robert Bunting, MD   Chief Complaint: f/u CAD, multiple medical issues  History of Present Illness: Robert [name] is a 89 y.o. male with visit-pertinent history of CAD (PCI LCx 2011, CABG 2015), severe AS s/p bioprosthetic AVR in 2015 at time of CABG, permanent atrial fibrillation (MAZE and LAA clip 2015) on warfarin, chronic HFpEF, GERD, HTN, HLD, tachy-brady 2022 s/p Abbott PPM, RBBB, small ascending aortic aneurysm, mild-mod TR and mod pHTN by echo 05/2021, small lymphocytic lymphoma, prostate CA seen for general cardiology follow-up. Last echo 05/2021 showed EF 55-60%, moderate asymmetric left ventricular hypertrophy of the basal segment. Mod pHTN, severe BAE, mild-moderate TR, well seated pericardial AV, small ascending aortic aneurysm 4.1cm, dilated IVC. Eliquis previously changed to Coumadin due to cost. His wife is on the same plan and Xarelto was too costly for her as well.  He is seen for follow-up reporting he is completely stable from prior visit. He reports chronic BLE edema since the 1980s. Reports adherence with Lasix 40mg  daily, says that PCP previously gave them OK to double Lasix for 3 days during periods of increased edema. They have not had to do that often as it's generally remained at baseline. Increased doses have not been particularly effective for completely resolving the swelling per their report. He has chronic DOE with activity that he reports is unchanged. He has not had any falls, chest pain, or syncope. He does not wish to make any changes or get any further testing at his age. Walks with cane.  Labwork independently reviewed: 01/2023 Hgb 10.3, plt ok, K 3.6, Cr 0.84, AST ALT OK 11/2022 digoxin 0.7 2022 TSH OK  ROS: .    Please see the history of present illness.   All other systems are reviewed and otherwise negative.  Studies Reviewed: [name]    EKG:  EKG is not ordered today but reviewed from 11/2022 with AF with RBBB  CV Studies: Cardiac studies reviewed are outlined and summarized above. Otherwise please see EMR for full report.   Current Reported Medications:.    Current Meds  Medication Sig   acetaminophen (TYLENOL) 325 MG tablet Take 650 mg by mouth every 6 (six) hours as needed for moderate pain.   Ascorbic Acid (VITAMIN C PO) Take 1 tablet by mouth 2 (two) times a week.   Cholecalciferol (DIALYVITE VITAMIN D 5000) 125 MCG (5000 UT) capsule Take 5,000 Units by mouth 2 (two) times a week.   digoxin (LANOXIN) 0.125 MG tablet Take 1 tablet (125 mcg total) by mouth daily.   furosemide (LASIX) 40 MG tablet Take 1 tablet (40 mg total) by mouth daily.   KLOR-CON M20 20 MEQ tablet Take 20 mEq by mouth daily.   losartan (COZAAR) 50 MG tablet TAKE 1 TABLET DAILY   metoprolol succinate (TOPROL-XL) 50 MG 24 hr tablet Take 1 tablet (50 mg total) by mouth in the morning and at bedtime.   omeprazole (PRILOSEC) 20 MG capsule Take 20 mg by mouth every morning.   rosuvastatin (CRESTOR) 20 MG tablet Take 1 tablet (20 mg total) by mouth daily.   warfarin (COUMADIN) 5 MG tablet TAKE 1 TABLET DAILY AS DIRECTED BY THE COUMADIN CLINIC.    Physical Exam:    VS:  BP 132/72   Pulse 71  Ht 5\' 9"  (1.753 m)   Wt 164 lb (74.4 kg)   SpO2 95%   BMI 24.22 kg/m    Wt Readings from Last 3 Encounters:  02/02/23 164 lb (74.4 kg)  01/02/23 161 lb 3.2 oz (73.1 kg)  11/13/22 165 lb 3.2 oz (74.9 kg)    GEN: Well nourished, well developed in no acute distress NECK: No JVD; No carotid bruits CARDIAC: irregularly irregular, no murmurs, rubs, gallops RESPIRATORY:  Clear to auscultation without rales, wheezing or rhonchi  ABDOMEN: Soft, non-tender, non-distended EXTREMITIES:  1+ BLE edema; No acute deformity   Asessement and Plan:.    1. CAD - doing well without recent  angina. Dr. Eldridge [name] has not had him on ASA in many years. He is on concomitant warfarin, seems appropriate to remain off ASA given his advanced age and chronic anemia. He is tolerating metoprolol and rosuvastatin. They report his PCP Robert [name] is checking his lipids regularly and they would prefer for her to continue to manage these - therefore would defer further refills of rosuvastatin to PCP when this comes due next. Recent LFTs 01/2023 were stable.  2. Severe AS s/p bioprosthetic AVR, mild-mod TR residually - symptomatically stable without acceleration of symptoms. The patient does not wish to pursue any updated testing at this time. Discussed SBE ppx. He is edentulous with false teeth. Discussed use of pre-dental abx if he ever does require dental manipulation. They will call us if this needs to occur.  3. Permanent atrial fib, tachy-brady s/p PPM - being managed by EP on digoxin + metoprolol.  Digoxin level stable 11/2022. HR stable today. He is anticoagulated with warfarin, refuses to consider DOAC due to prior cost concerns. No recent falls, OK to continue. INR followed by our anticoag clinic. Denies s/sx of bleeding. Follows with cancer center for his anemia, stable in 01/2023 compared to recent trends.  4. Chronic HFpEF with moderate PHTN - NYHA class 2-3 at baseline with edema on exam. However, he reports he is 100% at his baseline and does not wish to make any medication changes at this time. He will continue with Lasix 40mg  daily. He and his wife report that he was previously given the OK to double up Lasix for 3 days for any worsening in baseline edema, and they periodically do this with improvement. I have asked them to let us know if it becomes more frequent in which case I'd recommend updating BMET sooner. However, given infrequency of burst dosing, continue present regimen.   5. Small ascending aortic aneurysm - given advanced age, small size, and patient's desire to avoid testing,  conservative management is appropriate.     Disposition: F/u with Dr. Izora [name] to establish care in 6 months.  Signed, Robert Montana, PA-C

## 2023-02-02 ENCOUNTER — Encounter: Payer: Self-pay | Admitting: Physician Assistant

## 2023-02-02 ENCOUNTER — Ambulatory Visit: Payer: Medicare Other | Attending: Interventional Cardiology | Admitting: Physician Assistant

## 2023-02-02 VITALS — BP 132/72 | HR 71 | Ht 69.0 in | Wt 164.0 lb

## 2023-02-02 DIAGNOSIS — Z953 Presence of xenogenic heart valve: Secondary | ICD-10-CM | POA: Diagnosis not present

## 2023-02-02 DIAGNOSIS — I5032 Chronic diastolic (congestive) heart failure: Secondary | ICD-10-CM

## 2023-02-02 DIAGNOSIS — I2510 Atherosclerotic heart disease of native coronary artery without angina pectoris: Secondary | ICD-10-CM

## 2023-02-02 DIAGNOSIS — I272 Pulmonary hypertension, unspecified: Secondary | ICD-10-CM | POA: Diagnosis not present

## 2023-02-02 DIAGNOSIS — I7121 Aneurysm of the ascending aorta, without rupture: Secondary | ICD-10-CM | POA: Diagnosis not present

## 2023-02-02 DIAGNOSIS — I4821 Permanent atrial fibrillation: Secondary | ICD-10-CM

## 2023-02-02 NOTE — Patient Instructions (Addendum)
Medication Instructions:  Your physician recommends that you continue on your current medications as directed. Please refer to the Current Medication list given to you today. *If you need a refill on your cardiac medications before your next appointment, please call your pharmacy*   Lab Work: None ordered   Testing/Procedures: None ordered   Follow-Up: At [facility], you and your health needs are our priority.  As part of our continuing mission to provide you with exceptional heart care, we have created designated Provider Care Teams.  These Care Teams include your primary Cardiologist (physician) and Advanced Practice Providers (APPs -  Physician Assistants and Nurse Practitioners) who all work together to provide you with the care you need, when you need it.  We recommend signing up for the patient portal called "MyChart".  Sign up information is provided on this After Visit Summary.  MyChart is used to connect with patients for Virtual Visits (Telemedicine).  Patients are able to view lab/test results, encounter notes, upcoming appointments, etc.  Non-urgent messages can be sent to your provider as well.   To learn more about what you can do with MyChart, go to [email or web address].    Your next appointment:   6 month(s)  Provider:   Riley Lam, MD  Other Instructions  Endocarditis Information  You may be at risk for developing endocarditis since you have an artificial heart valve or a repaired heart valve. Endocarditis is an infection of the lining of the heart or heart valves. Certain surgical and dental procedures may put you at risk, such as teeth cleaning or other dental procedures or other medical procedures. Notify our office or your dentist before having any dental work or invasive/surgical procedures. You will need to take antibiotics before certain procedures. To prevent endocarditis, maintain good oral health. Seek prompt medical attention for any  mouth/gum, skin or urinary tract infections.

## 2023-02-10 ENCOUNTER — Ambulatory Visit: Payer: Medicare Other | Attending: Physician Assistant

## 2023-02-10 DIAGNOSIS — I480 Paroxysmal atrial fibrillation: Secondary | ICD-10-CM | POA: Diagnosis not present

## 2023-02-10 DIAGNOSIS — Z5181 Encounter for therapeutic drug level monitoring: Secondary | ICD-10-CM | POA: Insufficient documentation

## 2023-02-10 LAB — POCT INR: INR: 1.6 — AB (ref 2.0–3.0)

## 2023-02-10 NOTE — Patient Instructions (Signed)
Description   Take 2 tablets today and then START warfarin 1 tablet daily 1.5 tablets on Sundays.  Recheck INR in 4 weeks.  Stay consistent with Ensures (3 times per week)  Call if scheduled for any procedures or has any new medications  [phone]

## 2023-02-11 ENCOUNTER — Ambulatory Visit: Payer: Medicare Other | Admitting: Nurse Practitioner

## 2023-02-11 ENCOUNTER — Ambulatory Visit: Payer: Medicare Other | Admitting: Interventional Cardiology

## 2023-02-17 ENCOUNTER — Ambulatory Visit (INDEPENDENT_AMBULATORY_CARE_PROVIDER_SITE_OTHER): Payer: Medicare Other

## 2023-02-17 DIAGNOSIS — I495 Sick sinus syndrome: Secondary | ICD-10-CM | POA: Diagnosis not present

## 2023-02-17 DIAGNOSIS — I480 Paroxysmal atrial fibrillation: Secondary | ICD-10-CM

## 2023-02-18 LAB — CUP PACEART REMOTE DEVICE CHECK
Battery Remaining Longevity: 106 mo
Battery Remaining Percentage: 81 %
Battery Voltage: 3.04 V
Brady Statistic RV Percent Paced: 26 %
Date Time Interrogation Session: 20241217031242
Implantable Lead Connection Status: 753985
Implantable Lead Connection Status: 753985
Implantable Lead Implant Date: 20220321
Implantable Lead Implant Date: 20220321
Implantable Lead Location: 753859
Implantable Lead Location: 753860
Implantable Pulse Generator Implant Date: 20220321
Lead Channel Impedance Value: 480 Ohm
Lead Channel Pacing Threshold Amplitude: 0.75 V
Lead Channel Pacing Threshold Pulse Width: 0.4 ms
Lead Channel Sensing Intrinsic Amplitude: 9.5 mV
Lead Channel Setting Pacing Amplitude: 2.5 V
Lead Channel Setting Pacing Pulse Width: 0.4 ms
Lead Channel Setting Sensing Sensitivity: 2 mV
Pulse Gen Model: 2272
Pulse Gen Serial Number: 3909458

## 2023-02-23 ENCOUNTER — Telehealth: Payer: Self-pay | Admitting: Internal Medicine

## 2023-02-23 NOTE — Telephone Encounter (Signed)
New Message:       Daughter wants to know if patient will be able to do red light therapy, since he has a pacemaker?

## 2023-02-23 NOTE — Telephone Encounter (Signed)
Returned call to daughter.  Advised red light therapy would be ok for Pt.

## 2023-02-26 DIAGNOSIS — R040 Epistaxis: Secondary | ICD-10-CM | POA: Diagnosis not present

## 2023-02-26 DIAGNOSIS — J040 Acute laryngitis: Secondary | ICD-10-CM | POA: Diagnosis not present

## 2023-02-26 DIAGNOSIS — H7291 Unspecified perforation of tympanic membrane, right ear: Secondary | ICD-10-CM | POA: Diagnosis not present

## 2023-03-03 ENCOUNTER — Other Ambulatory Visit: Payer: Self-pay | Admitting: Student

## 2023-03-03 DIAGNOSIS — R1310 Dysphagia, unspecified: Secondary | ICD-10-CM | POA: Diagnosis not present

## 2023-03-03 DIAGNOSIS — K219 Gastro-esophageal reflux disease without esophagitis: Secondary | ICD-10-CM | POA: Diagnosis not present

## 2023-03-06 ENCOUNTER — Ambulatory Visit (HOSPITAL_COMMUNITY)
Admission: RE | Admit: 2023-03-06 | Discharge: 2023-03-06 | Disposition: A | Discharge status: Home / Self Care | Payer: Medicare Other | Source: Ambulatory Visit | Attending: Student

## 2023-03-06 ENCOUNTER — Other Ambulatory Visit: Payer: Self-pay | Admitting: Student

## 2023-03-06 DIAGNOSIS — R1310 Dysphagia, unspecified: Secondary | ICD-10-CM | POA: Insufficient documentation

## 2023-03-06 DIAGNOSIS — K449 Diaphragmatic hernia without obstruction or gangrene: Secondary | ICD-10-CM | POA: Diagnosis not present

## 2023-03-09 ENCOUNTER — Other Ambulatory Visit (HOSPITAL_COMMUNITY): Payer: Self-pay | Admitting: Student

## 2023-03-09 DIAGNOSIS — R1310 Dysphagia, unspecified: Secondary | ICD-10-CM

## 2023-03-10 ENCOUNTER — Ambulatory Visit: Payer: Medicare Other | Attending: Physician Assistant

## 2023-03-10 DIAGNOSIS — I480 Paroxysmal atrial fibrillation: Secondary | ICD-10-CM

## 2023-03-10 LAB — POCT INR: INR: 5.1 — AB (ref 2.0–3.0)

## 2023-03-10 NOTE — Patient Instructions (Signed)
 Description   HOLD today's dose and tomorrow's dose and then START warfarin 1 tablet daily.  Recheck INR in 3 weeks (per pt request, aware of risk)  Stay consistent with Ensures (3 times per week)  Call if scheduled for any procedures or has any new medications  [phone]

## 2023-03-17 ENCOUNTER — Other Ambulatory Visit (HOSPITAL_COMMUNITY): Payer: Self-pay | Admitting: Student

## 2023-03-17 ENCOUNTER — Telehealth: Payer: Self-pay | Admitting: Internal Medicine

## 2023-03-17 ENCOUNTER — Other Ambulatory Visit: Payer: Self-pay | Admitting: Physician Assistant

## 2023-03-17 DIAGNOSIS — R1310 Dysphagia, unspecified: Secondary | ICD-10-CM

## 2023-03-17 DIAGNOSIS — R059 Cough, unspecified: Secondary | ICD-10-CM

## 2023-03-17 NOTE — Telephone Encounter (Signed)
 Patient's daughter is requesting to speak with Dr. Lubertha Basque nurse specifically regarding details. She states it is a rather complicated matter and she cannot risk it being done incorrectly.

## 2023-03-17 NOTE — Telephone Encounter (Signed)
 Spoke with Robert [name] per DPR. Pt is almost out of furosemide  and digoxin . They need both of those called into mail service but because it will take about 2 weeks to get in they would like 10/14 days called into local pharmacy.  Will also check with Dr Waddell about the need of a dig level.

## 2023-03-18 ENCOUNTER — Encounter: Payer: Self-pay | Admitting: Internal Medicine

## 2023-03-18 NOTE — Telephone Encounter (Signed)
 Daughter asked that a nurse call her back about this medications

## 2023-03-18 NOTE — Telephone Encounter (Signed)
 error

## 2023-03-19 ENCOUNTER — Other Ambulatory Visit: Payer: Self-pay

## 2023-03-19 MED ORDER — DIGOXIN 125 MCG PO TABS: 0.1250 mg | ORAL_TABLET | Freq: Every day | ORAL | 0 refills | Status: DC

## 2023-03-19 MED ORDER — FUROSEMIDE 40 MG PO TABS: 40.0000 mg | ORAL_TABLET | Freq: Every day | ORAL | 0 refills | Status: DC

## 2023-03-19 NOTE — Telephone Encounter (Signed)
Spoke with Synetta Fail. Told her that the medications were [facility] and I would send in stat 15 day supply of each medication to [facility]. Verified Synetta Fail stated thanks.

## 2023-03-20 ENCOUNTER — Telehealth: Payer: Self-pay | Admitting: Physician Assistant

## 2023-03-20 NOTE — Telephone Encounter (Signed)
I received notification via Epic from [facility]:  "Adverse Drug Interaction: WARFARIN SODIUM and PANTOPRAZOLE SODIUM Our claims record suggests that your patient is receiving WARFARIN SODIUM and a proton pump inhibitor, PANTOPRAZOLE SODIUM. There have been post-marketing reports of increased INR in patients taking proton pump inhibitors with warfarin. PPIs may inhibit warfarin metabolism. It may be prudent to monitor INR levels when starting or stopping a PPI. Please consider the potential risks versus benefits for your patient."  I will route to triage team to clarify with patient if he is newly taking pantoprazole because our records indicate he is on omeprazole, neither prescribed by our office. Triage - once you clarify, can you send update to the anticoag team so they are aware of this notification? Thanks!

## 2023-03-20 NOTE — Telephone Encounter (Signed)
Spoke with the patient's wife who reports that the patient was taken off of omeprazole and started on pantoprazole 40 mg daily. Patient's medication list has been updated.

## 2023-03-27 NOTE — Addendum Note (Signed)
Addended by: Geralyn Flash D on: 03/27/2023 01:03 PM   Modules accepted: Orders

## 2023-03-27 NOTE — Progress Notes (Signed)
Remote pacemaker transmission.

## 2023-03-30 ENCOUNTER — Other Ambulatory Visit: Payer: Self-pay | Admitting: Interventional Cardiology

## 2023-03-30 DIAGNOSIS — Z5181 Encounter for therapeutic drug level monitoring: Secondary | ICD-10-CM

## 2023-03-31 ENCOUNTER — Ambulatory Visit: Payer: Medicare Other | Attending: Physician Assistant

## 2023-03-31 DIAGNOSIS — I480 Paroxysmal atrial fibrillation: Secondary | ICD-10-CM | POA: Diagnosis not present

## 2023-03-31 LAB — POCT INR: INR: 2.7 (ref 2.0–3.0)

## 2023-03-31 NOTE — Patient Instructions (Addendum)
Description   Continue taking warfarin 1 tablet daily.  Recheck INR in 5 weeks.  Stay consistent with Ensures (3 times per week)  Call if scheduled for any procedures or has any new medications  [phone]

## 2023-04-06 ENCOUNTER — Ambulatory Visit (HOSPITAL_COMMUNITY)
Admission: RE | Admit: 2023-04-06 | Discharge: 2023-04-06 | Disposition: A | Discharge status: Home / Self Care | Payer: Medicare Other | Source: Ambulatory Visit | Attending: Physician Assistant | Admitting: Physician Assistant

## 2023-04-06 ENCOUNTER — Telehealth: Payer: Self-pay | Admitting: Internal Medicine

## 2023-04-06 ENCOUNTER — Ambulatory Visit (HOSPITAL_COMMUNITY)
Admission: RE | Admit: 2023-04-06 | Discharge: 2023-04-06 | Disposition: A | Discharge status: Home / Self Care | Payer: Medicare Other | Source: Ambulatory Visit | Attending: Physician Assistant

## 2023-04-06 DIAGNOSIS — R638 Other symptoms and signs concerning food and fluid intake: Secondary | ICD-10-CM | POA: Diagnosis not present

## 2023-04-06 DIAGNOSIS — R1310 Dysphagia, unspecified: Secondary | ICD-10-CM | POA: Insufficient documentation

## 2023-04-06 DIAGNOSIS — R059 Cough, unspecified: Secondary | ICD-10-CM

## 2023-04-06 NOTE — Telephone Encounter (Signed)
Unfortunately given his age and extremely complex history, cannot fully evaluate SOB by phone. We are seeing a lot of Covid, flu, dehydration, PNA going around which confounds things in addition to his chronic anemia and cardiac disease. Recommend in person OV next available DOD slot; ED also an option.

## 2023-04-06 NOTE — Evaluation (Signed)
Modified Barium Swallow Study  Patient Details  Name: Robert [name] MRN: [id number] Date of Birth: 1927-12-28  Today's Date: 04/06/2023  Modified Barium Swallow completed.  Full report located under Chart Review in the Imaging Section.  History of Present Illness Pt is a 89 yo presenting for OP MBS due to reports of trouble swallowing. He noticed this more around the holidays when he said he was eating more "junk" food. It has improved since then, and he also notes changing his reflux medications in this timeframe. Esophagram 03/06/23 revealed: small hiatal hernia, poor primary stripping wave with tertiary contractions contributing to delayed barium transit and moderate dysmotility, and barium tablet suspended in the esophagus. PMH also includes: GERD, esophageal ulcer, esophagitis, CHF, CAD   Clinical Impression Pt's oropharyngeal swallow is within gross functional limits. He has mild anterior spillage with liquids and mildly reduced UES opening that leads to trace residue within the UES with purees. Penetration occurs with thin and nectar thick liquids due to timing of airway closure, but he achieves good laryngeal vestibule closure and can squeeze penetrates back out (PAS 2, considered to be normal). The one time this did not clear as the swallow was completed, the pt performed a spontaneous throat clear, which was effective. No overt reason for his symptoms are identified. He was given education about esophageal precautions, as this may be a more likely source. No further SLP f/u indicated at this time.  Factors that may increase risk of adverse event in presence of aspiration [name] & [name] 2021): Respiratory or GI disease  Swallow Evaluation Recommendations Recommendations: PO diet PO Diet Recommendation: Regular;Thin liquids (Level 0) Liquid Administration via: Cup;Straw Medication Administration: Whole meds with liquid Supervision: Patient able to self-feed Swallowing strategies   : Slow rate;Small bites/sips;Follow solids with liquids Postural changes: Position pt fully upright for meals;Stay upright 30-60 min after meals Oral care recommendations: Oral care BID (2x/day)      Mahala Menghini., M.A. CCC-SLP Acute Rehabilitation Services Office [phone]  Secure chat preferred  04/06/2023,12:29 PM

## 2023-04-06 NOTE — Telephone Encounter (Signed)
Spoke to patients wife Dewayne Hatch who reports patient has increased shortness of breath with ambulation and generalized weakness in his legs. Denies any recent illness or changes in health.  Requested manual transmission. Normal device function.  No episodes noted. Presenting rhythm VS 75 bpm.   Will forward back to gen cards to advise further.

## 2023-04-06 NOTE — Telephone Encounter (Signed)
Called patient's daughter back about message. Patient has been very SOB with activity and feels extremely fatigue after just walking around the house. Patient's daugher called PCP and they stated that she should call cardiology. Will send message to [facility] who last saw patient and Dr. Ladona Ridgel his EP doctor. Patient's daughter is wondering if there can be something changed with his device to help. Will send message to device clinic also.

## 2023-04-06 NOTE — Telephone Encounter (Signed)
Called patient daughter and made him an appointment to see DOD.

## 2023-04-06 NOTE — Telephone Encounter (Signed)
Pt c/o Shortness Of Breath: STAT if SOB developed within the last 24 hours or pt is noticeably SOB on the phone  1. Are you currently SOB (can you hear that pt is SOB on the phone)? No   2. How long have you been experiencing SOB? 2 weeks But is getting worse 3. Are you SOB when sitting or when up moving around? both  4. Are you currently experiencing any other symptoms? Really fatigue

## 2023-04-07 NOTE — Progress Notes (Signed)
 Cardiology Office Note:   Date:  04/09/2023  ID:  Robert [name], DOB 05-02-1927, MRN [id number] PCP:  [name], PA  [facility] Providers Cardiologist:  Wendel Haws, MD Referring MD: [name], [place]  Chief Complaint/Reason for Referral: DOD visit fatigue and shortness of breath ASSESSMENT:    1. Shortness of breath   2. Other fatigue   3. Permanent atrial fibrillation (HCC)   4. Secondary hypercoagulable state (HCC)   5. S/P CABG x 1   6. S/P AVR   7. Hyperlipidemia LDL goal <70   8. Aortic atherosclerosis (HCC)   9. CKD (chronic kidney disease) stage 2, GFR 60-89 ml/min   10. Pacemaker   11. Primary hypertension   12. Chronic diastolic congestive heart failure (HCC)     PLAN:   In order of problems listed above: Dyspnea: Increase Lasix  to 40 mg twice daily.  We will obtain echocardiogram, reflex TSH, CBC, and CMP next week Fatigue: Check above labs; pacemaker interrogation recently was reassuring.   Permanent atrial fibrillation: Continue Coumadin , digoxin , and metoprolol .  Check digoxin  level, CMP, and CBC next week Secondary hypercoagulable state: Continue Coumadin  for now unless CBC is abnormal. Status post CABG: Continue Coumadin  instead of aspirin , rosuvastatin , Hyperlipidemia: Check lipid panel and LFTs today.  I do not think intensive lipid lowering is necessarily required in this advanced age individual. Aortic atherosclerosis: Continue Coumadin  instead of aspirin  and rosuvastatin . CKD: Continue losartan  for renal protection. Status post pacemaker: Followed by EP.  Pacemaker interrogation on the day the patient called on February 3 was reassuring. Hypertension: BP mildly elevated.  I think this is permissible and this 89 year old. Chronic diastolic heart heart failure: Continue Toprol , losartan , and Lasix .  Increase Lasix  to 40 mg twice daily            Dispo:  Return in about 4 weeks (around 05/07/2023).      Medication  Adjustments/Labs and Tests Ordered: Current medicines are reviewed at length with the patient today.  Concerns regarding medicines are outlined above.  The following changes have been made:     Labs/tests ordered: Orders Placed This Encounter  Procedures   Comprehensive metabolic panel   CBC   TSH Rfx on Abnormal to Free T4   Digoxin  level   EKG 12-Lead   ECHOCARDIOGRAM COMPLETE    Medication Changes: Meds ordered this encounter  Medications   furosemide  (LASIX ) 40 MG tablet    Sig: Take 1 tablet (40 mg total) by mouth 2 (two) times daily.    Dispense:  180 tablet    Refill:  2    Dose change (increased to twice daily 04/09/23).    Current medicines are reviewed at length with the patient today.  The patient does not have concerns regarding medicines.  I spent 35 minutes reviewing all clinical data during and prior to this visit including all relevant imaging studies, laboratories, clinical information from other health systems and prior notes from both Cardiology and other specialties, interviewing the patient, conducting a complete physical examination, and coordinating care in order to formulate a comprehensive and personalized evaluation and treatment plan.   History of Present Illness:      FOCUSED PROBLEM LIST:    CAD Status post remote bare-metal stenting of OM CABG with vein graft to RV marginal plus AVR plus maze plus atrial clip 2015 AS Status post 23 mm Magna Ease AVR plus CABG plus maze post atrial clip 2015 Permanent AF CV 2 score of 4  On Coumadin  due to cost Hyperlipidemia Aortic atherosclerosis Chest x-ray 2024 Hypertension CKD stage II Tachybradycardia syndrome Status post PPM  followed by EP RBBB  BMI 24 Diastolic dysfunction LVH, EF 55 to 60%, mean AV gradient 10, TTE 2023  2/25: The patient is a 89 year old male with above listed medical problems here for expedited DOD visit due to fatigue and shortness of breath.  The patient contacted our  office on 2/3 with these complaints.  He did send a remote transmission on this day which I reviewed.  This demonstrated reassuring findings without evidence of rapid atrial fibrillation.  The patient is here with his family.  He tells me he has been short of breath for several years but over the last few months he is noticing increasing shortness of breath.  He has problems with paroxysmal nocturnal dyspnea.  His legs are also quite swollen.  He has increasing short of breath with exertion but no dyspnea at rest.  He has not noticed an increase in his weight however.  He does endorse early satiety.  He has been completely compliant with his medications.  He has not noticed any bleeding.  He continues to take Lasix  once a day.          Current Medications: Current Meds  Medication Sig   acetaminophen  (TYLENOL ) 325 MG tablet Take 650 mg by mouth every 6 (six) hours as needed for moderate pain.   Ascorbic Acid (VITAMIN C PO) Take 1 tablet by mouth 2 (two) times a week.   Cholecalciferol  (DIALYVITE VITAMIN D  5000) 125 MCG (5000 UT) capsule Take 5,000 Units by mouth 2 (two) times a week.   digoxin  (LANOXIN ) 0.125 MG tablet Take 1 tablet (0.125 mg total) by mouth daily.   furosemide  (LASIX ) 40 MG tablet Take 1 tablet (40 mg total) by mouth 2 (two) times daily.   KLOR-CON  M20 20 MEQ tablet Take 20 mEq by mouth daily.   losartan  (COZAAR ) 50 MG tablet TAKE 1 TABLET DAILY   metoprolol  succinate (TOPROL -XL) 50 MG 24 hr tablet TAKE 1 TABLET IN THE MORNING AND AT BEDTIME   pantoprazole  (PROTONIX ) 40 MG tablet Take 40 mg by mouth daily.   rosuvastatin  (CRESTOR ) 20 MG tablet TAKE 1 TABLET DAILY   warfarin (COUMADIN ) 5 MG tablet TAKE 1 TABLET DAILY AS DIRECTED BY THE COUMADIN  CLINIC.   [DISCONTINUED] furosemide  (LASIX ) 40 MG tablet Take 1 tablet (40 mg total) by mouth daily.     Review of Systems:   Please see the history of present illness.    All other systems reviewed and are negative.      EKGs/Labs/Other Test Reviewed:   EKG: EKG from September 2024 demonstrates rate controlled atrial fibrillation with a right bundle branch block  EKG Interpretation Date/Time:    Ventricular Rate:    PR Interval:    QRS Duration:    QT Interval:    QTC Calculation:   R Axis:      Text Interpretation:           Risk Assessment/Calculations:    CHA2DS2-VASc Score = 4   This indicates a 4.8% annual risk of stroke. The patient's score is based upon: CHF History: 1 HTN History: 1 Diabetes History: 0 Stroke History: 0 Vascular Disease History: 0 Age Score: 2 Gender Score: 0         Physical Exam:   VS:  BP 136/80   Pulse 85   Ht 5' 9 (1.753 m)   Wt 162 lb (  73.5 kg)   SpO2 95%   BMI 23.92 kg/m        Wt Readings from Last 3 Encounters:  04/09/23 162 lb (73.5 kg)  02/02/23 164 lb (74.4 kg)  01/02/23 161 lb 3.2 oz (73.1 kg)      GENERAL:  No apparent distress, AOx3 HEENT: JVP of around 10 cm CAR: Irregular rate and rhythm no murmurs, gallops, rubs, or thrills RES:  Clear to auscultation bilaterally ABD:  Soft, nontender, nondistended, positive bowel sounds x 4 VASC:  +2 radial pulses, +2 carotid pulses NEURO:  CN 2-12 grossly intact; motor and sensory grossly intact PSYCH:  No active depression or anxiety EXT: +3 edema, without ecchymosis, or cyanosis  Signed, Curby Carswell K Venida Tsukamoto, MD  04/09/2023 10:57 AM    [facility] [address] [place], [place], [place]  [address] Phone: [phone]; Fax: [phone]   Note:  This document was prepared using Dragon voice recognition software and may include unintentional dictation errors.

## 2023-04-08 ENCOUNTER — Telehealth: Payer: Self-pay

## 2023-04-08 NOTE — Telephone Encounter (Signed)
 Med Rec complete, allergies and Pharmacy verified April 08 2023.

## 2023-04-09 ENCOUNTER — Ambulatory Visit: Payer: Medicare Other | Attending: Internal Medicine | Admitting: Internal Medicine

## 2023-04-09 ENCOUNTER — Encounter: Payer: Self-pay | Admitting: Internal Medicine

## 2023-04-09 VITALS — BP 136/80 | HR 85 | Ht 69.0 in | Wt 162.0 lb

## 2023-04-09 DIAGNOSIS — Z952 Presence of prosthetic heart valve: Secondary | ICD-10-CM | POA: Diagnosis not present

## 2023-04-09 DIAGNOSIS — I5032 Chronic diastolic (congestive) heart failure: Secondary | ICD-10-CM | POA: Diagnosis not present

## 2023-04-09 DIAGNOSIS — R5383 Other fatigue: Secondary | ICD-10-CM | POA: Diagnosis not present

## 2023-04-09 DIAGNOSIS — I10 Essential (primary) hypertension: Secondary | ICD-10-CM

## 2023-04-09 DIAGNOSIS — E785 Hyperlipidemia, unspecified: Secondary | ICD-10-CM

## 2023-04-09 DIAGNOSIS — I4821 Permanent atrial fibrillation: Secondary | ICD-10-CM | POA: Diagnosis not present

## 2023-04-09 DIAGNOSIS — I700 Atherosclerosis of aorta: Secondary | ICD-10-CM | POA: Diagnosis not present

## 2023-04-09 DIAGNOSIS — Z951 Presence of aortocoronary bypass graft: Secondary | ICD-10-CM | POA: Diagnosis not present

## 2023-04-09 DIAGNOSIS — Z950 Presence of cardiac pacemaker: Secondary | ICD-10-CM | POA: Diagnosis not present

## 2023-04-09 DIAGNOSIS — R0602 Shortness of breath: Secondary | ICD-10-CM | POA: Diagnosis not present

## 2023-04-09 DIAGNOSIS — N182 Chronic kidney disease, stage 2 (mild): Secondary | ICD-10-CM | POA: Diagnosis not present

## 2023-04-09 DIAGNOSIS — D6869 Other thrombophilia: Secondary | ICD-10-CM

## 2023-04-09 MED ORDER — FUROSEMIDE 40 MG PO TABS: 40.0000 mg | ORAL_TABLET | Freq: Two times a day (BID) | ORAL | 2 refills | Status: DC

## 2023-04-09 NOTE — Patient Instructions (Signed)
 Medication Instructions:  Your physician has recommended you make the following change in your medication:   1) INCREASE furosemide  (Lasix ) to 40 mg twice daily  *If you need a refill on your cardiac medications before your next appointment, please call your pharmacy*  Lab Work: Next week at [facility]: CMP, CBC, reflex TSH, digoxin  level You may also go to any of these [facility] locations:   [facility] - [address] ([facility]) - [address] [phone] [address]   [place] - [address]    [place]  - [address]    [place] - [address] - [address] [employer]  - [address] ([facility]  If you have labs (blood work) drawn today and your tests are completely normal, you will receive your results only by: [employer] (if you have MyChart) OR A paper copy in the mail If you have any lab test that is abnormal or we need to change your treatment, we will call you to review the results.  Testing/Procedures: Your physician has requested that you have an echocardiogram prior to next office. Echocardiography is a painless test that uses sound waves to create images of your heart. It provides your doctor with information about the size and shape of your heart and how well your heart's chambers and valves are working. This procedure takes approximately one hour. There are no restrictions for this procedure. Please do NOT wear cologne, perfume, aftershave, or lotions (deodorant is allowed). Please arrive 15 minutes prior to your appointment time.  Please note: We ask at that you not bring children with you during ultrasound (echo/ vascular) testing. Due to room size and safety concerns, children are not allowed in the ultrasound rooms during exams. Our front office staff cannot provide observation of children in our lobby area while  testing is being conducted. An adult accompanying a patient to their appointment will only be allowed in the ultrasound room at the discretion of the ultrasound technician under special circumstances. We apologize for any inconvenience.  Follow-Up: At [facility], you and your health needs are our priority.  As part of our continuing mission to provide you with exceptional heart care, we have created designated Provider Care Teams.  These Care Teams include your primary Cardiologist (physician) and Advanced Practice Providers (APPs -  Physician Assistants and Nurse Practitioners) who all work together to provide you with the care you need, when you need it.  Your next appointment:   1 month(s)  The format for your next appointment:   In Person  Provider:   Orren Fabry, PA-C, Dayna Dunn, PA-C, Jackee Alberts, NP, Glendia Ferrier, PA-C, or Artist Pouch, PA-C  Other Instructions   1st Floor: - Lobby - Registration  - Pharmacy  - Lab - Cafe  2nd Floor: - PV Lab - Diagnostic Testing (echo, CT, nuclear med)  3rd Floor: - Vacant  4th Floor: - TCTS (cardiothoracic surgery) - AFib Clinic - Structural Heart Clinic - Vascular Surgery  - Vascular Ultrasound  5th Floor: - HeartCare Cardiology (general and EP) - Clinical Pharmacy for coumadin , hypertension, lipid, weight-loss medications, and med management appointments    Valet parking services will be available as well.

## 2023-04-15 ENCOUNTER — Telehealth: Payer: Self-pay | Admitting: Internal Medicine

## 2023-04-15 NOTE — Telephone Encounter (Signed)
Pt c/o medication issue:  1. Name of Medication: furosemide (LASIX) 40 MG tablet   2. How are you currently taking this medication (dosage and times per day)? Twice daily   3. Are you having a reaction (difficulty breathing--STAT)? No   4. What is your medication issue? Daughter is calling to confirm how long patient should keep medication increased. She states he will be getting the labs performed tomorrow in [place]. Please advise.

## 2023-04-15 NOTE — Telephone Encounter (Signed)
Spoke with pt's daughter per DPR on file and explained that Dr. Lynnette Caffey stated this dose of Lasix could be the new dosing the pt is requiring but we will decide on that after looking at repeat labs once completed. Was able to move pt 1 month f/u appt to 05/07/23. Pt's daughter had no further questions.

## 2023-04-21 DIAGNOSIS — R0602 Shortness of breath: Secondary | ICD-10-CM | POA: Diagnosis not present

## 2023-04-21 DIAGNOSIS — R5383 Other fatigue: Secondary | ICD-10-CM | POA: Diagnosis not present

## 2023-04-22 LAB — COMPREHENSIVE METABOLIC PANEL
ALT: 16 [IU]/L (ref 0–44)
AST: 20 [IU]/L (ref 0–40)
Albumin: 4.4 g/dL (ref 3.6–4.6)
Alkaline Phosphatase: 31 [IU]/L — ABNORMAL LOW (ref 44–121)
BUN/Creatinine Ratio: 31 — ABNORMAL HIGH (ref 10–24)
BUN: 27 mg/dL (ref 10–36)
Bilirubin Total: 0.7 mg/dL (ref 0.0–1.2)
CO2: 28 mmol/L (ref 20–29)
Calcium: 9.3 mg/dL (ref 8.6–10.2)
Chloride: 101 mmol/L (ref 96–106)
Creatinine, Ser: 0.87 mg/dL (ref 0.76–1.27)
Globulin, Total: 1.5 g/dL (ref 1.5–4.5)
Glucose: 93 mg/dL (ref 70–99)
Potassium: 4.4 mmol/L (ref 3.5–5.2)
Sodium: 143 mmol/L (ref 134–144)
Total Protein: 5.9 g/dL — ABNORMAL LOW (ref 6.0–8.5)
eGFR: 80 mL/min/{1.73_m2} (ref >59)

## 2023-04-22 LAB — CBC
Hematocrit: 32.8 % — ABNORMAL LOW (ref 37.5–51.0)
Hemoglobin: 10.7 g/dL — ABNORMAL LOW (ref 13.0–17.7)
MCH: 31.7 pg (ref 26.6–33.0)
MCHC: 32.6 g/dL (ref 31.5–35.7)
MCV: 97 fL (ref 79–97)
Platelets: 141 10*3/uL — ABNORMAL LOW (ref 150–450)
RBC: 3.38 x10E6/uL — ABNORMAL LOW (ref 4.14–5.80)
RDW: 14.8 % (ref 11.6–15.4)
WBC: 8.5 10*3/uL (ref 3.4–10.8)

## 2023-04-22 LAB — DIGOXIN LEVEL: Digoxin, Serum: 0.8 ng/mL (ref 0.5–0.9)

## 2023-04-22 LAB — TSH RFX ON ABNORMAL TO FREE T4: TSH: 2.71 u[IU]/mL (ref 0.450–4.500)

## 2023-04-24 ENCOUNTER — Inpatient Hospital Stay (HOSPITAL_COMMUNITY)
Admission: EM | Admit: 2023-04-24 | Discharge: 2023-04-25 | DRG: 193 | Disposition: A | Discharge status: Home / Self Care | Payer: Medicare Other | Attending: Internal Medicine | Admitting: Internal Medicine

## 2023-04-24 ENCOUNTER — Emergency Department (HOSPITAL_COMMUNITY): Payer: Medicare Other

## 2023-04-24 ENCOUNTER — Encounter (HOSPITAL_COMMUNITY): Payer: Self-pay

## 2023-04-24 ENCOUNTER — Other Ambulatory Visit: Payer: Self-pay

## 2023-04-24 DIAGNOSIS — D638 Anemia in other chronic diseases classified elsewhere: Secondary | ICD-10-CM | POA: Diagnosis not present

## 2023-04-24 DIAGNOSIS — Z7901 Long term (current) use of anticoagulants: Secondary | ICD-10-CM | POA: Diagnosis not present

## 2023-04-24 DIAGNOSIS — Z1152 Encounter for screening for COVID-19: Secondary | ICD-10-CM

## 2023-04-24 DIAGNOSIS — E782 Mixed hyperlipidemia: Secondary | ICD-10-CM | POA: Diagnosis not present

## 2023-04-24 DIAGNOSIS — Z823 Family history of stroke: Secondary | ICD-10-CM | POA: Diagnosis not present

## 2023-04-24 DIAGNOSIS — I2510 Atherosclerotic heart disease of native coronary artery without angina pectoris: Secondary | ICD-10-CM | POA: Diagnosis not present

## 2023-04-24 DIAGNOSIS — Z950 Presence of cardiac pacemaker: Secondary | ICD-10-CM

## 2023-04-24 DIAGNOSIS — I495 Sick sinus syndrome: Secondary | ICD-10-CM | POA: Diagnosis present

## 2023-04-24 DIAGNOSIS — J101 Influenza due to other identified influenza virus with other respiratory manifestations: Secondary | ICD-10-CM | POA: Diagnosis present

## 2023-04-24 DIAGNOSIS — I071 Rheumatic tricuspid insufficiency: Secondary | ICD-10-CM | POA: Diagnosis present

## 2023-04-24 DIAGNOSIS — E785 Hyperlipidemia, unspecified: Secondary | ICD-10-CM | POA: Diagnosis not present

## 2023-04-24 DIAGNOSIS — R062 Wheezing: Secondary | ICD-10-CM | POA: Diagnosis not present

## 2023-04-24 DIAGNOSIS — Z951 Presence of aortocoronary bypass graft: Secondary | ICD-10-CM

## 2023-04-24 DIAGNOSIS — Z8546 Personal history of malignant neoplasm of prostate: Secondary | ICD-10-CM | POA: Diagnosis not present

## 2023-04-24 DIAGNOSIS — R0602 Shortness of breath: Secondary | ICD-10-CM | POA: Diagnosis not present

## 2023-04-24 DIAGNOSIS — K219 Gastro-esophageal reflux disease without esophagitis: Secondary | ICD-10-CM | POA: Diagnosis present

## 2023-04-24 DIAGNOSIS — Z888 Allergy status to other drugs, medicaments and biological substances status: Secondary | ICD-10-CM | POA: Diagnosis not present

## 2023-04-24 DIAGNOSIS — Z841 Family history of disorders of kidney and ureter: Secondary | ICD-10-CM

## 2023-04-24 DIAGNOSIS — Z952 Presence of prosthetic heart valve: Secondary | ICD-10-CM

## 2023-04-24 DIAGNOSIS — R0981 Nasal congestion: Secondary | ICD-10-CM | POA: Diagnosis not present

## 2023-04-24 DIAGNOSIS — M353 Polymyalgia rheumatica: Secondary | ICD-10-CM | POA: Diagnosis present

## 2023-04-24 DIAGNOSIS — I4821 Permanent atrial fibrillation: Secondary | ICD-10-CM | POA: Diagnosis present

## 2023-04-24 DIAGNOSIS — I10 Essential (primary) hypertension: Secondary | ICD-10-CM | POA: Diagnosis present

## 2023-04-24 DIAGNOSIS — R918 Other nonspecific abnormal finding of lung field: Secondary | ICD-10-CM | POA: Diagnosis not present

## 2023-04-24 DIAGNOSIS — Z880 Allergy status to penicillin: Secondary | ICD-10-CM

## 2023-04-24 DIAGNOSIS — R791 Abnormal coagulation profile: Secondary | ICD-10-CM | POA: Diagnosis not present

## 2023-04-24 DIAGNOSIS — R070 Pain in throat: Secondary | ICD-10-CM | POA: Diagnosis not present

## 2023-04-24 DIAGNOSIS — I480 Paroxysmal atrial fibrillation: Secondary | ICD-10-CM | POA: Diagnosis present

## 2023-04-24 DIAGNOSIS — J9601 Acute respiratory failure with hypoxia: Secondary | ICD-10-CM

## 2023-04-24 DIAGNOSIS — J90 Pleural effusion, not elsewhere classified: Secondary | ICD-10-CM | POA: Diagnosis not present

## 2023-04-24 DIAGNOSIS — R059 Cough, unspecified: Secondary | ICD-10-CM | POA: Diagnosis not present

## 2023-04-24 DIAGNOSIS — Z79899 Other long term (current) drug therapy: Secondary | ICD-10-CM | POA: Diagnosis not present

## 2023-04-24 HISTORY — DX: Acute respiratory failure with hypoxia: J96.01

## 2023-04-24 HISTORY — DX: Influenza due to other identified influenza virus with other respiratory manifestations: J10.1

## 2023-04-24 LAB — DIGOXIN LEVEL: Digoxin Level: 0.5 ng/mL — ABNORMAL LOW (ref 0.8–2.0)

## 2023-04-24 LAB — RESP PANEL BY RT-PCR (RSV, FLU A&B, COVID)  RVPGX2
Influenza A by PCR: POSITIVE — AB
Influenza B by PCR: NEGATIVE
Resp Syncytial Virus by PCR: NEGATIVE
SARS Coronavirus 2 by RT PCR: NEGATIVE

## 2023-04-24 LAB — COMPREHENSIVE METABOLIC PANEL
ALT: 21 U/L (ref 0–44)
AST: 26 U/L (ref 15–41)
Albumin: 4.4 g/dL (ref 3.5–5.0)
Alkaline Phosphatase: 27 U/L — ABNORMAL LOW (ref 38–126)
Anion gap: 12 (ref 5–15)
BUN: 22 mg/dL (ref 8–23)
CO2: 29 mmol/L (ref 22–32)
Calcium: 9.5 mg/dL (ref 8.9–10.3)
Chloride: 100 mmol/L (ref 98–111)
Creatinine, Ser: 0.93 mg/dL (ref 0.61–1.24)
GFR, Estimated: >60 mL/min (ref >60)
Glucose, Bld: 113 mg/dL — ABNORMAL HIGH (ref 70–99)
Potassium: 4.1 mmol/L (ref 3.5–5.1)
Sodium: 141 mmol/L (ref 135–145)
Total Bilirubin: 1.1 mg/dL (ref 0.0–1.2)
Total Protein: 6.7 g/dL (ref 6.5–8.1)

## 2023-04-24 LAB — URINALYSIS, W/ REFLEX TO CULTURE (INFECTION SUSPECTED)
Bacteria, UA: NONE SEEN
Bilirubin Urine: NEGATIVE
Glucose, UA: NEGATIVE mg/dL
Ketones, ur: NEGATIVE mg/dL
Leukocytes,Ua: NEGATIVE
Nitrite: NEGATIVE
Protein, ur: NEGATIVE mg/dL
Specific Gravity, Urine: 1.012 (ref 1.005–1.030)
pH: 6 (ref 5.0–8.0)

## 2023-04-24 LAB — CBC WITH DIFFERENTIAL/PLATELET
Abs Immature Granulocytes: 0.03 10*3/uL (ref 0.00–0.07)
Basophils Absolute: 0 10*3/uL (ref 0.0–0.1)
Basophils Relative: 1 %
Eosinophils Absolute: 0 10*3/uL (ref 0.0–0.5)
Eosinophils Relative: 1 %
HCT: 31.1 % — ABNORMAL LOW (ref 39.0–52.0)
Hemoglobin: 10.2 g/dL — ABNORMAL LOW (ref 13.0–17.0)
Immature Granulocytes: 0 %
Lymphocytes Relative: 24 %
Lymphs Abs: 2 10*3/uL (ref 0.7–4.0)
MCH: 32.3 pg (ref 26.0–34.0)
MCHC: 32.8 g/dL (ref 30.0–36.0)
MCV: 98.4 fL (ref 80.0–100.0)
Monocytes Absolute: 0.7 10*3/uL (ref 0.1–1.0)
Monocytes Relative: 8 %
Neutro Abs: 5.7 10*3/uL (ref 1.7–7.7)
Neutrophils Relative %: 66 %
Platelets: 118 10*3/uL — ABNORMAL LOW (ref 150–400)
RBC: 3.16 MIL/uL — ABNORMAL LOW (ref 4.22–5.81)
RDW: 16.3 % — ABNORMAL HIGH (ref 11.5–15.5)
WBC: 8.5 10*3/uL (ref 4.0–10.5)
nRBC: 0 % (ref 0.0–0.2)

## 2023-04-24 LAB — TROPONIN I (HIGH SENSITIVITY): Troponin I (High Sensitivity): 52 ng/L — ABNORMAL HIGH (ref <18)

## 2023-04-24 LAB — PROTIME-INR
INR: 1.8 — ABNORMAL HIGH (ref 0.8–1.2)
Prothrombin Time: 21.5 s — ABNORMAL HIGH (ref 11.4–15.2)

## 2023-04-24 LAB — MAGNESIUM: Magnesium: 1.7 mg/dL (ref 1.7–2.4)

## 2023-04-24 LAB — I-STAT CG4 LACTIC ACID, ED
Lactic Acid, Venous: 0.8 mmol/L (ref 0.5–1.9)
Lactic Acid, Venous: 1 mmol/L (ref 0.5–1.9)

## 2023-04-24 MED ORDER — DIGOXIN 125 MCG PO TABS
0.1250 mg | ORAL_TABLET | Freq: Every evening | ORAL | Status: DC
Start: 2023-04-25 — End: 2023-04-25

## 2023-04-24 MED ORDER — GUAIFENESIN ER 600 MG PO TB12
600.0000 mg | ORAL_TABLET | Freq: Two times a day (BID) | ORAL | Status: DC
  Administered 2023-04-24 – 2023-04-25 (×2): 600 mg via ORAL
  Filled 2023-04-24 (×2): qty 1

## 2023-04-24 MED ORDER — METOPROLOL SUCCINATE ER 25 MG PO TB24
50.0000 mg | ORAL_TABLET | Freq: Two times a day (BID) | ORAL | Status: DC
  Administered 2023-04-25: 50 mg via ORAL
  Filled 2023-04-24: qty 2

## 2023-04-24 MED ORDER — MELATONIN 3 MG PO TABS: 3.0000 mg | ORAL_TABLET | Freq: Every evening | ORAL | Status: DC | PRN

## 2023-04-24 MED ORDER — MENTHOL 3 MG MT LOZG
1.0000 | LOZENGE | OROMUCOSAL | Status: DC | PRN
Start: 2023-04-24 — End: 2023-04-25

## 2023-04-24 MED ORDER — ONDANSETRON HCL 4 MG/2ML IJ SOLN: 4.0000 mg | Freq: Four times a day (QID) | INTRAMUSCULAR | Status: DC | PRN

## 2023-04-24 MED ORDER — ACETAMINOPHEN 325 MG PO TABS: 650.0000 mg | ORAL_TABLET | Freq: Four times a day (QID) | ORAL | Status: DC | PRN

## 2023-04-24 MED ORDER — PANTOPRAZOLE SODIUM 40 MG PO TBEC
40.0000 mg | DELAYED_RELEASE_TABLET | Freq: Every day | ORAL | Status: DC
  Administered 2023-04-25: 40 mg via ORAL
  Filled 2023-04-24 (×2): qty 1

## 2023-04-24 MED ORDER — BENZONATATE 100 MG PO CAPS: 200.0000 mg | ORAL_CAPSULE | Freq: Three times a day (TID) | ORAL | Status: DC | PRN

## 2023-04-24 MED ORDER — SODIUM CHLORIDE 0.9 % IV BOLUS
500.0000 mL | Freq: Once | INTRAVENOUS | Status: AC
  Administered 2023-04-24: 500 mL via INTRAVENOUS

## 2023-04-24 MED ORDER — WARFARIN - PHARMACIST DOSING INPATIENT: Freq: Every day | Status: DC

## 2023-04-24 MED ORDER — ACETAMINOPHEN 325 MG PO TABS
650.0000 mg | ORAL_TABLET | Freq: Once | ORAL | Status: AC
  Administered 2023-04-24: 650 mg via ORAL
  Filled 2023-04-24: qty 2

## 2023-04-24 MED ORDER — WARFARIN SODIUM 5 MG PO TABS
5.0000 mg | ORAL_TABLET | Freq: Once | ORAL | Status: AC
Start: 2023-04-24 — End: 2023-04-24
  Administered 2023-04-24: 5 mg via ORAL
  Filled 2023-04-24: qty 1

## 2023-04-24 MED ORDER — ROSUVASTATIN CALCIUM 20 MG PO TABS
20.0000 mg | ORAL_TABLET | Freq: Every evening | ORAL | Status: DC
  Administered 2023-04-24: 20 mg via ORAL
  Filled 2023-04-24: qty 1

## 2023-04-24 MED ORDER — ACETAMINOPHEN 500 MG PO TABS
1000.0000 mg | ORAL_TABLET | Freq: Once | ORAL | Status: AC
  Administered 2023-04-24: 1000 mg via ORAL
  Filled 2023-04-24: qty 2

## 2023-04-24 MED ORDER — ACETAMINOPHEN 650 MG RE SUPP: 650.0000 mg | Freq: Four times a day (QID) | RECTAL | Status: DC | PRN

## 2023-04-24 MED ORDER — OSELTAMIVIR PHOSPHATE 30 MG PO CAPS
30.0000 mg | ORAL_CAPSULE | Freq: Two times a day (BID) | ORAL | Status: DC
  Administered 2023-04-25: 30 mg via ORAL
  Filled 2023-04-24: qty 1

## 2023-04-24 MED ORDER — OSELTAMIVIR PHOSPHATE 75 MG PO CAPS
75.0000 mg | ORAL_CAPSULE | Freq: Once | ORAL | Status: AC
  Administered 2023-04-24: 75 mg via ORAL
  Filled 2023-04-24: qty 1

## 2023-04-24 MED ORDER — FUROSEMIDE 10 MG/ML IJ SOLN
40.0000 mg | Freq: Once | INTRAMUSCULAR | Status: AC
  Administered 2023-04-24: 40 mg via INTRAVENOUS
  Filled 2023-04-24: qty 4

## 2023-04-24 NOTE — H&P (Signed)
History and Physical      Robert [name] GNF:[id number] DOB: August 15, 1927 DOA: 04/24/2023; DOS: 04/24/2023  PCP: Robert Officer, PA  Patient coming from: home   I have personally briefly reviewed patient's old medical records in [facility]  Chief Complaint: sob  HPI: Robert [name] is a 89 y.o. male with medical history significant for chronic right-sided pleural effusion, severe aortic stenosis status post aortic valve replacement in March 2015, coronary artery disease status post CABG in March 2015, paroxysmal atrial fibrillation chronically anticoagulated on warfarin, complicated by sick sinus syndrome status post pacemaker placement in March 2022, essential pretension, hyperlipidemia, GERD, anemia of chronic disease associated baseline hemoglobin 9.5-11, who is admitted to [facility] on 04/24/2023 with influenza A infection complicated by acute hypoxic respiratory distress after presenting from home to [facility] ED complaining of shortness of breath.   The patient reports 2 days of new onset shortness of breath associated with new nonproductive cough, subjective fever, generalized myalgias.  Denies any associated chills or full body rigors.  This is also been associated with some rhinitis, rhinorrhea.  Reports that his shortness of breath has not been associated with any orthopnea, PND, or worsening peripheral edema.  No new calf tenderness or new lower extremity erythema.  He also denies any recent hemoptysis, wheezing, nor any recent chest pain, palpitations, diaphoresis, dizziness, presyncope, or syncope.  Conveys no known chronic underlying pulmonary pathology, including no history of COPD.  Denies any known baseline supplemental oxygen requirement.  Medical history notable for paroxysmal atrial fibrillation for which he is chronically anticoagulated on warfarin, complicated by sick sinus syndrome status post pacemaker placement in March 2022.  Additionally, he has a  history of severe aortic stenosis, status post aortic valve replacement in March 2015.  Most recent echocardiogram was performed in March 2023, was notable for LVEF 55 to 60%, indeterminate diastolic parameters, no evidence of focal wall motion normality, normal right ventricular systolic function, severely dilated bilateral atria, no evidence of mitral regurgitation, mild to moderate tricuspid regurgitation, status post aortic valve replacement will without evidence of pair of valvular leak.  In the setting of the above symptoms, the patient presented to urgent care earlier today at which time he was diagnosed with influenza A infection, and instructed to present to the ED emergency department for further evaluation management thereof.    ED Course:  Vital signs in the ED were notable for the following: Temperature max 100.6 (38.1 C), heart rates in the 60s to 70s; systolic pressures in the 90s to 130s; respiratory rate 24-36, oxygen saturation initially 88% on room air, [place] improving into the range of 94 to 97% on 2 L nasal cannula.  Labs were notable for the following: CMP notable for the following: Sodium 141, potassium 4.1, bicarbonate 29, creatinine 0.93 compared to most recent prior creatinine data point 0.87 on 04/21/2023, BUN to creatinine ratio greater than 20, liver enzymes were within normal limits.  High-sensitivity troponin I was ordered by EDP, with result currently pending.  Initial lactic acid 0.8, with repeat value noted to be 1.0.  CBC notable for white cell count 8500, hemoglobin 10.2 associated Neuraceq/Norocarp properties and relative to most recent prior hemoglobin value of 10.7 on 04/21/2023, platelet count 118.  INR 1.8.  Urinalysis notable for no white blood cells, leukocyte esterase/nitrate negative.  Blood cultures x 2 were collected.  Influenza A PCR was positive.  COVID, RSV, influenza B PCR were all negative.  Per my interpretation, EKG  in ED demonstrated the following: In  comparison to most recent prior EKG from 04/09/2023, presenting EKG was read as undetermined rhythm, suspected to be of paced rhythm with right bundle branch block, heart rate 73, nonspecific T wave inversions in V2 through V4, which appear unchanged from most recent prior EKG, showing nonspecific less than 1 mm ST elevation limited to V3, will demonstrating no evidence of additional ST changes.  Imaging in the ED, per corresponding formal radiology read, was notable for the following: 2 view chest x-ray compared to most recent prior chest x-ray from 07/23/2022 showed no evidence of acute cardiopulmonary process, all demonstrating unchanged moderate right pleural effusion with probable associated compressive atelectatic changes, and demonstrate no evidence of infiltrate, edema, left-sided pleural effusion or any evidence of pneumothorax.  While in the ED, the following were administered: Acetaminophen 1 g p.o. x 1 dose, Lasix 40 mg IV x 1 dose, Tamiflu, normal saline x 500 cc bolus.  Subsequently, the patient was admitted for further evaluation management of influenza A infection complicated by acute hypoxic respiratory distress.     Review of Systems: As per HPI otherwise 10 point review of systems negative.   Past Medical History:  Diagnosis Date   Aortic stenosis    a. s/p tissue AVR 05/2013;  b. Echo (06/2013):  Mod LVH, EF 60-65%, no RWMA, Gr 2 DD, AVR ok (mean 13 mmHg), MAC, mild MR, mod LAE, mild RAE, PASP 46 mmHg (mild pulmo HTN)   Blood loss anemia    a. 05/2009 a/w GIB.   CAD (coronary artery disease)    a. Cath 04/2009: BMS to LCx 04/2009; CTO of RCA with L-R collaterals, 40% ostial diag.   Carpal tunnel syndrome    bilateral, carpal tunnel release x 2   Esophageal ulcer    a. 05/2009 with hemorrhage - injected with epinephrine-Dr. Molly Maduro Buccini.   Esophagitis    a. 05/2009 a/w GIB.   GERD (gastroesophageal reflux disease)    EGD, Dr. Tyler Aas 2008   GI bleed    a. 05/2009 as  above: with associated anemia. Lower esophageal ulcer with extensive erosive esophagitis and large clot by EGD 05/2009.    Hx of colonoscopy 2008   Dr. Tyler Aas, polyps- 5 yr f/u recommended   Hyperlipidemia    Hypertension    PAF (paroxysmal atrial fibrillation) (HCC)    a. Post-op AVR;  b. 04/2014 recurrent.   Perforated ear drum    right ear drum, tube in left ear drum, Dr. Pollyann Kennedy   Polymyalgia rheumatica [facility])    Prostate cancer [facility])    Vitamin D deficiency     Past Surgical History:  Procedure Laterality Date   AORTIC VALVE REPLACEMENT N/A 05/12/2013   Procedure: AORTIC VALVE REPLACEMENT (AVR);  Surgeon: Kerin Perna, MD;  Location: [facility] OR;  Service: Open Heart Surgery;  Laterality: N/A;   CARDIAC CATHETERIZATION  2011   CARDIOVERSION N/A 04/12/2020   Procedure: CARDIOVERSION;  Surgeon: Lars Masson, MD;  Location: [facility] ENDOSCOPY;  Service: Cardiovascular;  Laterality: N/A;   CORONARY ARTERY BYPASS GRAFT N/A 05/12/2013   Procedure: CORONARY ARTERY BYPASS GRAFTING (CABG);  Surgeon: Kerin Perna, MD;  Location: [facility] OR;  Service: Open Heart Surgery;  Laterality: N/A;  CABG x 1 using left leg greater saphenous vein harvested endoscopically   INTRAOPERATIVE TRANSESOPHAGEAL ECHOCARDIOGRAM N/A 05/12/2013   Procedure: INTRAOPERATIVE TRANSESOPHAGEAL ECHOCARDIOGRAM;  Surgeon: Kerin Perna, MD;  Location: [facility] OR;  Service: Open Heart Surgery;  Laterality: N/A;  LEFT AND RIGHT HEART CATHETERIZATION WITH CORONARY ANGIOGRAM N/A 05/09/2013   Procedure: LEFT AND RIGHT HEART CATHETERIZATION WITH CORONARY ANGIOGRAM;  Surgeon: Runell Gess, MD;  Location: [facility] CATH LAB;  Service: Cardiovascular;  Laterality: N/A;   MAZE N/A 05/12/2013   Procedure: MAZE;  Surgeon: Kerin Perna, MD;  Location: [facility] OR;  Service: Open Heart Surgery;  Laterality: N/A;   PACEMAKER IMPLANT N/A 05/21/2020   Procedure: PACEMAKER IMPLANT;  Surgeon: Marinus Maw, MD;  Location: [facility] INVASIVE CV LAB;  Service:  Cardiovascular;  Laterality: N/A;   PROSTATECTOMY     TEE WITHOUT CARDIOVERSION N/A 04/12/2020   Procedure: TRANSESOPHAGEAL ECHOCARDIOGRAM (TEE);  Surgeon: Lars Masson, MD;  Location: [facility] ENDOSCOPY;  Service: Cardiovascular;  Laterality: N/A;    Social History:  reports that he has never smoked. He has never used smokeless tobacco. He reports that he does not drink alcohol and does not use drugs.   Allergies  Allergen Reactions   Amoxicillin Swelling    penile swelling   Lisinopril Cough   Kenalog [Triamcinolone] Rash    Family History  Problem Relation Age of Onset   CVA Mother    Kidney disease Father    Breast cancer Daughter     Family history reviewed and not pertinent    Prior to Admission medications   Medication Sig Start Date End Date Taking? Authorizing Provider  acetaminophen (TYLENOL) 325 MG tablet Take 650 mg by mouth every 6 (six) hours as needed for moderate pain.    [provider]  Ascorbic Acid (VITAMIN C PO) Take 1 tablet by mouth 2 (two) times a week.    [provider]  Cholecalciferol (DIALYVITE VITAMIN D 5000) 125 MCG (5000 UT) capsule Take 5,000 Units by mouth 2 (two) times a week.    [provider]  digoxin (LANOXIN) 0.125 MG tablet Take 1 tablet (0.125 mg total) by mouth daily. 03/18/23   Christell Constant, MD  furosemide (LASIX) 40 MG tablet Take 1 tablet (40 mg total) by mouth 2 (two) times daily. 04/09/23   Orbie Pyo, MD  KLOR-CON M20 20 MEQ tablet Take 20 mEq by mouth daily. 06/25/21   [provider]  losartan (COZAAR) 50 MG tablet TAKE 1 TABLET DAILY 07/03/22   Corky Crafts, MD  metoprolol succinate (TOPROL-XL) 50 MG 24 hr tablet TAKE 1 TABLET IN THE MORNING AND AT BEDTIME 03/31/23   Dunn, Dayna N, PA-C  pantoprazole (PROTONIX) 40 MG tablet Take 40 mg by mouth daily. 03/13/23   [provider]  rosuvastatin (CRESTOR) 20 MG tablet TAKE 1 TABLET DAILY 03/31/23   Laurann Montana, PA-C   warfarin (COUMADIN) 5 MG tablet TAKE 1 TABLET DAILY AS DIRECTED BY THE COUMADIN CLINIC. 12/30/22   Laurann Montana, PA-C     Objective    Physical Exam: Vitals:   04/24/23 1706 04/24/23 1715 04/24/23 1904 04/24/23 1929  BP: (!) 125/53 137/63 (!) 109/50 (S) (!) 95/44  Pulse: 74 75 73 69  Resp: (!) 28 (!) 24 (!) 36 (!) 39  Temp:      TempSrc:      SpO2: 95% 94% 94% 97%  Weight:      Height:        General: appears to be stated age; alert, oriented; mildly increased work of breathing Skin: warm, dry, no rash Head:  AT/[place] Mouth:  Oral mucosa membranes appear moist, normal dentition Neck: supple; trachea midline Heart:  RRR; did not appreciate  any M/R/G; pacemaker noted Lungs: Slightly diminished right basilar breath sounds, but otherwise CTAB, did not appreciate any wheezes, rales, or rhonchi Abdomen: + BS; soft, ND, NT Vascular: 2+ pedal pulses b/l; 2+ radial pulses b/l Extremities: trace edema bilateral lower extremities, no muscle wasting Neuro: strength and sensation intact in upper and lower extremities b/l     Labs on Admission: I have personally reviewed following labs and imaging studies  CBC: Recent Labs  Lab 04/21/23 1033 04/24/23 1419  WBC 8.5 8.5  NEUTROABS  --  5.7  HGB 10.7* 10.2*  HCT 32.8* 31.1*  MCV 97 98.4  PLT 141* 118*   Basic Metabolic Panel: Recent Labs  Lab 04/21/23 1033 04/24/23 1419  NA 143 141  K 4.4 4.1  CL 101 100  CO2 28 29  GLUCOSE 93 113*  BUN 27 22  CREATININE 0.87 0.93  CALCIUM 9.3 9.5   GFR: Estimated Creatinine Clearance: 49.6 mL/min (by C-G formula based on SCr of 0.93 mg/dL). Liver Function Tests: Recent Labs  Lab 04/21/23 1033 04/24/23 1419  AST 20 26  ALT 16 21  ALKPHOS 31* 27*  BILITOT 0.7 1.1  PROT 5.9* 6.7  ALBUMIN 4.4 4.4   No results for input(s): "LIPASE", "AMYLASE" in the last 168 hours. No results for input(s): "AMMONIA" in the last 168 hours. Coagulation Profile: Recent Labs  Lab  04/24/23 1654  INR 1.8*   Cardiac Enzymes: No results for input(s): "CKTOTAL", "CKMB", "CKMBINDEX", "TROPONINI" in the last 168 hours. BNP (last 3 results) No results for input(s): "PROBNP" in the last 8760 hours. HbA1C: No results for input(s): "HGBA1C" in the last 72 hours. CBG: No results for input(s): "GLUCAP" in the last 168 hours. Lipid Profile: No results for input(s): "CHOL", "HDL", "LDLCALC", "TRIG", "CHOLHDL", "LDLDIRECT" in the last 72 hours. Thyroid Function Tests: No results for input(s): "TSH", "T4TOTAL", "FREET4", "T3FREE", "THYROIDAB" in the last 72 hours. Anemia Panel: No results for input(s): "VITAMINB12", "FOLATE", "FERRITIN", "TIBC", "IRON", "RETICCTPCT" in the last 72 hours. Urine analysis:    Component Value Date/Time   COLORURINE YELLOW 04/24/2023 1419   APPEARANCEUR CLEAR 04/24/2023 1419   LABSPEC 1.012 04/24/2023 1419   PHURINE 6.0 04/24/2023 1419   GLUCOSEU NEGATIVE 04/24/2023 1419   HGBUR SMALL (A) 04/24/2023 1419   BILIRUBINUR NEGATIVE 04/24/2023 1419   KETONESUR NEGATIVE 04/24/2023 1419   PROTEINUR NEGATIVE 04/24/2023 1419   UROBILINOGEN 1.0 05/11/2013 2340   NITRITE NEGATIVE 04/24/2023 1419   LEUKOCYTESUR NEGATIVE 04/24/2023 1419    Radiological Exams on Admission: DG Chest 2 View Result Date: 04/24/2023 CLINICAL DATA:  Shortness of breath. EXAM: CHEST - 2 VIEW COMPARISON:  07/23/2022. FINDINGS: There is moderate right pleural effusion with probable associated compressive atelectatic changes. Bilateral lung fields are otherwise clear. Left costophrenic angle is clear. Evaluation of cardiomediastinal silhouette is nondiagnostic due to right lower hemithorax opacification. Sternotomy wires, prosthetic aortic valve and presumed left atrial appendage occlusion device again seen. There is a left sided 2-lead pacemaker. No acute osseous abnormalities. The soft tissues are within normal limits. IMPRESSION: *Moderate right pleural effusion with probable  associated compressive atelectatic changes. Electronically Signed   By: Jules Schick M.D.   On: 04/24/2023 15:35      Assessment/Plan   Principal Problem:   Influenza A Active Problems:   Essential hypertension   HLD (hyperlipidemia)   Paroxysmal atrial fibrillation (HCC)   Acute hypoxic respiratory failure (HCC)   Pleural effusion, right   Subtherapeutic international normalized ratio (INR)   GERD (  gastroesophageal reflux disease)   Anemia of chronic disease     #) Acute hypoxic respiratory distress due to influenza A infection: Diagnosis of influenza A infection on basis of 2 days of new onset shortness of breath, cough, subjective fever, generalized myalgias, rhinitis, rhinorrhea, with finding of influenza A positive PCR today.   in the context of these acute respiratory symptoms and no known baseline supplemental O2 requirements, presenting O2 sat note to be 88% on room air, subsequently improving into the range of 94 to 97% on 2 L nasal cannula, thereby meeting criteria for acute hypoxic respiratory distress as opposed to acute hypoxic respiratory failure at this time.   As the patient's acute respiratory symptoms started within the last 48 hours, the patient presents within the window for Tamiflu, with first dose of such administered in the ED this evening.  While he does have a mildly elevated temperature, temperature max of 38.1 C does not meet the quantitative threshold of greater than 38.3 C for inclusion in SIRS criteria.  Therefore, in the absence of objective fever greater than 38.3 and in the absence of leukocytosis, SIRS criteria are not met for sepsis at this time.  Of note, lactic acid level x 2 were found to be nonelevated, as further quantified above.  Blood cultures x 2 were collected in the ED.  However, in the absence of any evidence of underlying bacterial infection, will refrain from initiation of IV antibiotics at this time.  Specifically, chest x-ray, in  comparison to most recent prior chest x-ray from May 2024 shows no evidence of acute cardiopulmonary process or interval change, noting the chronicity of his right-sided pleural effusion, will demonstrating no evidence of infiltrate, edema, left-sided pleural effusion or pneumothorax.  No radiographic evidence to suggest underlying bacterial pneumonia at this time.  Additionally, urinalysis was not consistent with UTI and COVID/RSV PCR were found to be negative.  In considering other potential factors contributing to the patient's acute hypoxic respiratory distress, he has no known history of chronic underlying pulmonary pathology, including no known history of COPD or asthma.  Given the chronicity of his right-sided pleural effusion, there is no clinical or radiographic evidence to suggest acutely decompensated heart failure at this time.  Will check BNP to further evaluate, and closely monitor ensuing volume status as outlined below.  Additionally, ACS appears less likely at this time in the absence of any recent CP and in the context of presenting EKG showing no e/o acute ischemic process.  High sensitive troponin I has been ordered by EDP, with withdrawal currently pending.  Clinically, presentation is less suggestive of acute PE at this time, although his slightly subtherapeutic INR while on warfarin for thromboembolic prophylaxis in the setting of paroxysmal atrial fibrillation, is noted.  Plan: Continue Tamiflu.  Assess from a tree, flutter valve, scheduled guaifenesin, prn Tessalon Perles.  Prn acetaminophen for fever.  monitor on telemetry. CMP/CBC in the AM. Check serum Mg and Phos levels. Check blood gas, BMP..  Follow-up result of troponin level.  Add on procalcitonin level.  Resume outpatient warfarin via inpatient pharmacy consult.                 #) Chronic right-sided pleural effusion: Unchanged right-sided pleural effusion dating back to at least May 2024, of moderate size.   Etiology not entirely clear to me at this time, but this does not appear to be an acute finding.   Plan: Monitor strict I's and O's and daily weights.  Repeat CMP, CBC in the morning.                     #) Paroxysmal atrial fibrillation: Documented history of such. In setting of CHA2DS2-VASc score of  3, there is an indication for chronic anticoagulation for thromboembolic prophylaxis. Consistent with this, patient is chronically anticoagulated on warfarin, with slightly subtherapeutic INR of 1.8 noted per this evening's labs, with associated goal INR of 2-3 noted.  Due to the patient's INR is only slightly subtherapeutic, will refrain from bridging at this time, but rather placed inpatient pharmacy consultation for further assistance with warfarin dosing and INR monitoring during this hospitalization.  Home AV nodal blocking regimen: Metoprolol succinate 50 mg p.o., which he takes on a twice daily basis at home.  Additionally, he is on digoxin as an outpatient.   His history of paroxysmal atrial fibrillation is complicated by a history of sick sinus syndrome status post pacemaker placement in March 2022.  Presenting EKG appears to reflect a regular rhythm, without overt evidence of acute ischemic changes, as further detailed above.  Most recent echocardiogram was performed in March 2023 and was notable for LVEF 55 to 60%, indeterminate diastolic parameters, normal right ventricular systolic function, severely dilated bilateral atria and no evidence of mitral regurgitation.  While showing mild to moderate tricuspid regurgitation.  Plan: monitor strict I's & O's and daily weights. CMP/CBC in AM. Check serum mag level. Continue home metoprolol succinate as well as digoxin.  I placed an inpatient pharmacy consult for assistance with warfarin dosing and further monitoring of INR during this hospitalization.  Monitor on telemetry.                    #) Essential  Hypertension: documented h/o such, with outpatient antihypertensive regimen including.  Losartan, Lasix, metoprolol succinate . SBP's in the ED today in the 90s to 130s mmHg. it is noted that he appears to have chronically white blood pressures, potential in the setting of his history of severe aortic stenosis status post aortic valve replacement, resulting in lower MAP pressures.  The setting of his presenting acute infection, will be conservative with resumption of outpatient and hypertensive medications, as outlined below.   Plan: Close monitoring of subsequent BP via routine VS. resume home beta-blocker.  Hold next dose of Lasix and losartan for now.  Monitor strict I's and O's and daily weights.  Monitor on telemetry.                   #) Hyperlipidemia: documented h/o such. On high intensity rosuvastatin as outpatient.   Plan: continue home statin.                   #) GERD: documented h/o such; on Protonix as outpatient.   Plan: continue home PPI.                  #) Anemia of chronic disease: Documented history of such, a/w with baseline hgb range 9.5-11, with presenting hgb consistent with this range, in the absence of any overt evidence of active bleed.     Plan: Repeat CBC in the morning.     DVT prophylaxis: SCD's + resumption of home warfarin via inpatient pharmacy consult, as further detailed above Code Status: Full code Family Communication: I d/w patient's wife and daughters, present at bedside. Disposition Plan: Per Rounding Team Consults called: none;  Admission status: Inpatient     I SPENT GREATER  THAN 75  MINUTES IN CLINICAL CARE TIME/MEDICAL DECISION-MAKING IN COMPLETING THIS ADMISSION.      Chaney Born Starlee Corralejo DO [facility]  From 7PM - 7AM   04/24/2023, 7:42 PM

## 2023-04-24 NOTE — ED Triage Notes (Addendum)
Patient sent from UC for pneumonia found on xray, also flu positive. Patient A&ox4 in triage, not hypoxic, mildly tachypneic. Flu symptoms started Wednesday night.

## 2023-04-24 NOTE — ED Notes (Signed)
Patients oxygen saturation 88% on room air while ambulating in the room. Particia Nearing, MD notified.

## 2023-04-24 NOTE — ED Provider Notes (Signed)
[place] EMERGENCY DEPARTMENT AT [facility] Provider Note   CSN: [id number] Arrival date & time: 04/24/23  1256     History  Chief Complaint  Patient presents with   Shortness of Breath    Robert [name] is a 89 y.o. male.  Pt is a 89 yo male with pmhx significant for htn, prostate cancer, CTS, paroxysmal afib (on Coumadin), polymyalgia rheumatica, GERD, hx gi bleed, cad, and aortic stenosis.  Pt has been having sob since Wednesday, 2/19.  Pt has had fevers and chills.  Pt last took tylenol this am.  He is also having swelling to his legs.  He did see his cardiologist on 2/6 and his lasix dose was increased.  Pt went to UC this am and was found to be flu a positive and have possible pna on CXR.       Home Medications Prior to Admission medications   Medication Sig Start Date End Date Taking? Authorizing Provider  acetaminophen (TYLENOL) 325 MG tablet Take 650 mg by mouth every 6 (six) hours as needed for moderate pain.    [provider]  Ascorbic Acid (VITAMIN C PO) Take 1 tablet by mouth 2 (two) times a week.    [provider]  Cholecalciferol (DIALYVITE VITAMIN D 5000) 125 MCG (5000 UT) capsule Take 5,000 Units by mouth 2 (two) times a week.    [provider]  digoxin (LANOXIN) 0.125 MG tablet Take 1 tablet (0.125 mg total) by mouth daily. 03/18/23   Christell Constant, MD  furosemide (LASIX) 40 MG tablet Take 1 tablet (40 mg total) by mouth 2 (two) times daily. 04/09/23   Orbie Pyo, MD  KLOR-CON M20 20 MEQ tablet Take 20 mEq by mouth daily. 06/25/21   [provider]  losartan (COZAAR) 50 MG tablet TAKE 1 TABLET DAILY 07/03/22   Corky Crafts, MD  metoprolol succinate (TOPROL-XL) 50 MG 24 hr tablet TAKE 1 TABLET IN THE MORNING AND AT BEDTIME 03/31/23   Dunn, Dayna N, PA-C  pantoprazole (PROTONIX) 40 MG tablet Take 40 mg by mouth daily. 03/13/23   [provider]  rosuvastatin (CRESTOR) 20 MG tablet  TAKE 1 TABLET DAILY 03/31/23   Dunn, Tacey Ruiz, PA-C  warfarin (COUMADIN) 5 MG tablet TAKE 1 TABLET DAILY AS DIRECTED BY THE COUMADIN CLINIC. 12/30/22   Laurann Montana, PA-C      Allergies    Amoxicillin, Lisinopril, and Kenalog [triamcinolone]    Review of Systems   Review of Systems  Constitutional:  Positive for fever.  Respiratory:  Positive for shortness of breath.   Cardiovascular:  Positive for leg swelling.  All other systems reviewed and are negative.   Physical Exam Updated Vital Signs BP (S) (!) 95/44 Comment: Dr. Particia Nearing made aware  Pulse 69   Temp (!) 100.6 F (38.1 C) (Oral)   Resp (!) 39   Ht 5\' 9"  (1.753 m)   Wt 74.8 kg   SpO2 97%   BMI 24.37 kg/m  Physical Exam Vitals and nursing note reviewed.  Constitutional:      Appearance: He is well-developed.  HENT:     Head: Normocephalic and atraumatic.     Mouth/Throat:     Mouth: Mucous membranes are moist.     Pharynx: Oropharynx is clear.  Eyes:     Extraocular Movements: Extraocular movements intact.     Pupils: Pupils are equal, round, and reactive to light.  Cardiovascular:     Rate  and Rhythm: Normal rate. Rhythm irregular.  Pulmonary:     Effort: Pulmonary effort is normal.     Breath sounds: Rhonchi present.  Abdominal:     General: Bowel sounds are normal.     Palpations: Abdomen is soft.  Musculoskeletal:     Cervical back: Normal range of motion and neck supple.     Right lower leg: Edema present.     Left lower leg: Edema present.  Skin:    General: Skin is warm.     Capillary Refill: Capillary refill takes less than 2 seconds.  Neurological:     General: No focal deficit present.     Mental Status: He is alert and oriented to person, place, and time.  Psychiatric:        Mood and Affect: Mood normal.        Behavior: Behavior normal.     ED Results / Procedures / Treatments   Labs (all labs ordered are listed, but only abnormal results are displayed) Labs Reviewed  RESP PANEL BY  RT-PCR (RSV, FLU A&B, COVID)  RVPGX2 - Abnormal; Notable for the following components:      Result Value   Influenza A by PCR POSITIVE (*)    All other components within normal limits  COMPREHENSIVE METABOLIC PANEL - Abnormal; Notable for the following components:   Glucose, Bld 113 (*)    Alkaline Phosphatase 27 (*)    All other components within normal limits  CBC WITH DIFFERENTIAL/PLATELET - Abnormal; Notable for the following components:   RBC 3.16 (*)    Hemoglobin 10.2 (*)    HCT 31.1 (*)    RDW 16.3 (*)    Platelets 118 (*)    All other components within normal limits  URINALYSIS, W/ REFLEX TO CULTURE (INFECTION SUSPECTED) - Abnormal; Notable for the following components:   Hgb urine dipstick SMALL (*)    All other components within normal limits  PROTIME-INR - Abnormal; Notable for the following components:   Prothrombin Time 21.5 (*)    INR 1.8 (*)    All other components within normal limits  DIGOXIN LEVEL - Abnormal; Notable for the following components:   Digoxin Level 0.5 (*)    All other components within normal limits  CULTURE, BLOOD (ROUTINE X 2)  CULTURE, BLOOD (ROUTINE X 2)  CBC WITH DIFFERENTIAL/PLATELET  COMPREHENSIVE METABOLIC PANEL  MAGNESIUM  MAGNESIUM  PHOSPHORUS  I-STAT CG4 LACTIC ACID, ED  I-STAT CG4 LACTIC ACID, ED  TROPONIN I (HIGH SENSITIVITY)    EKG EKG Interpretation Date/Time:  Friday April 24 2023 14:39:09 EST Ventricular Rate:  77 PR Interval:    QRS Duration:  132 QT Interval:  408 QTC Calculation: 461 R Axis:   -39  Text Interpretation: Wide QRS rhythm with occasional Premature ventricular complexes Left axis deviation Right bundle branch block Left ventricular hypertrophy with repolarization abnormality ( R in aVL ) Abnormal ECG When compared with ECG of 24-Apr-2023 14:24, PREVIOUS ECG IS PRESENT No significant change since last tracing Confirmed by Jacalyn Lefevre [phone]) on 04/24/2023 4:12:13 PM  Radiology DG Chest 2  View Result Date: 04/24/2023 CLINICAL DATA:  Shortness of breath. EXAM: CHEST - 2 VIEW COMPARISON:  07/23/2022. FINDINGS: There is moderate right pleural effusion with probable associated compressive atelectatic changes. Bilateral lung fields are otherwise clear. Left costophrenic angle is clear. Evaluation of cardiomediastinal silhouette is nondiagnostic due to right lower hemithorax opacification. Sternotomy wires, prosthetic aortic valve and presumed left atrial appendage occlusion device again seen.  There is a left sided 2-lead pacemaker. No acute osseous abnormalities. The soft tissues are within normal limits. IMPRESSION: *Moderate right pleural effusion with probable associated compressive atelectatic changes. Electronically Signed   By: Jules Schick M.D.   On: 04/24/2023 15:35    Procedures Procedures    Medications Ordered in ED Medications  oseltamivir (TAMIFLU) capsule 75 mg (has no administration in time range)  acetaminophen (TYLENOL) tablet 650 mg (has no administration in time range)    Or  acetaminophen (TYLENOL) suppository 650 mg (has no administration in time range)  melatonin tablet 3 mg (has no administration in time range)  ondansetron (ZOFRAN) injection 4 mg (has no administration in time range)  oseltamivir (TAMIFLU) capsule 30 mg (has no administration in time range)  sodium chloride 0.9 % bolus 500 mL (has no administration in time range)  acetaminophen (TYLENOL) tablet 1,000 mg (1,000 mg Oral Given 04/24/23 1711)  furosemide (LASIX) injection 40 mg (40 mg Intravenous Given 04/24/23 1711)    ED Course/ Medical Decision Making/ A&P                                 Medical Decision Making Amount and/or Complexity of Data Reviewed Labs: ordered. Radiology: ordered.  Risk OTC drugs. Prescription drug management. Decision regarding hospitalization.   This patient presents to the ED for concern of sob, this involves an extensive number of treatment options, and  is a complaint that carries with it a high risk of complications and morbidity.  The differential diagnosis includes rsv/flu/covid, pna, chf, bronchitis   Co morbidities that complicate the patient evaluation  htn, prostate cancer, CTS, paroxysmal afib (on Coumadin), polymyalgia rheumatica, GERD, hx gi bleed, cad, and aortic stenosis   Additional history obtained:  Additional history obtained from epic chart review External records from outside source obtained and reviewed including family   Lab Tests:  I Ordered, and personally interpreted labs.  The pertinent results include:  ua neg other than small hgb, cmp nl, cbc nl other than cbc with hgb 10.2 (stable) and plt low at 118 (141 on 2/18), lactic nl, dig low at 0.5; inr low at 1.8; flu a +; covid/rsv neg   Imaging Studies ordered:  I ordered imaging studies including cxr  I independently visualized and interpreted imaging which showed  Moderate right pleural effusion with probable associated  compressive atelectatic changes.   I agree with the radiologist interpretation   Cardiac Monitoring:  The patient was maintained on a cardiac monitor.  I personally viewed and interpreted the cardiac monitored which showed an underlying rhythm of: paced   Medicines ordered and prescription drug management:  I ordered medication including lasix  for sx  Reevaluation of the patient after these medicines showed that the patient improved I have reviewed the patients home medicines and have made adjustments as needed   Test Considered:  cxr   Critical Interventions:  oxygen   Consultations Obtained:  I requested consultation with the hospitalist (Dr. Arlean Hopping),  and discussed lab and imaging findings as well as pertinent plan - he will admit   Problem List / ED Course:  Right pleural effusion:  it looks like he's had this effusion since at least May of 2024.   Influenza A with resp failure:  pt is started on tamiflu.  His  O2 sat drops to 88% just getting out of bed.  Pt placed on 2L and oxygen up to  the mid-90s. Peripheral edema:  pt given iv lasix in ED which will also maybe help pleural effusion. Subtherapeutic INR:  pt takes his coumadin at night and has not taken it today. Hypotension:  this occurred after lasix iv which I think was too much for him.  Pt given 500 cc NS bolus.    Reevaluation:  After the interventions noted above, I reevaluated the patient and found that they have :improved   Social Determinants of Health:  Lives at home   Dispostion:  After consideration of the diagnostic results and the patients response to treatment, I feel that the patent would benefit from admission.  CRITICAL CARE Performed by: Jacalyn Lefevre   Total critical care time: 30 minutes  Critical care time was exclusive of separately billable procedures and treating other patients.  Critical care was necessary to treat or prevent imminent or life-threatening deterioration.  Critical care was time spent personally by me on the following activities: development of treatment plan with patient and/or surrogate as well as nursing, discussions with consultants, evaluation of patient's response to treatment, examination of patient, obtaining history from patient or surrogate, ordering and performing treatments and interventions, ordering and review of laboratory studies, ordering and review of radiographic studies, pulse oximetry and re-evaluation of patient's condition.           Final Clinical Impression(s) / ED Diagnoses Final diagnoses:  Pleural effusion on right  Influenza A  Acute respiratory failure with hypoxia [name])    Rx / DC Orders ED Discharge Orders     None         Jacalyn Lefevre, MD 04/24/23 1944

## 2023-04-24 NOTE — Progress Notes (Addendum)
ANTICOAGULATION CONSULT NOTE  Pharmacy Consult for Warfarin Indication: atrial fibrillation  Allergies  Allergen Reactions   Amoxicillin Swelling    penile swelling   Lisinopril Cough   Kenalog [Triamcinolone] Rash    Patient Measurements: Height: 5\' 9"  (175.3 cm) Weight: 74.8 kg (165 lb) IBW/kg (Calculated) : 70.7  Vital Signs: Temp: 99.1 F (37.3 C) (02/21 1955) Temp Source: Oral (02/21 1955) BP: 107/49 (02/21 2030) Pulse Rate: 66 (02/21 2030)  Labs: Recent Labs    04/24/23 1419 04/24/23 1654  HGB 10.2*  --   HCT 31.1*  --   PLT 118*  --   LABPROT  --  21.5*  INR  --  1.8*  CREATININE 0.93  --     Estimated Creatinine Clearance: 49.6 mL/min (by C-G formula based on SCr of 0.93 mg/dL).   Medical History: Past Medical History:  Diagnosis Date   Aortic stenosis    a. s/p tissue AVR 05/2013;  b. Echo (06/2013):  Mod LVH, EF 60-65%, no RWMA, Gr 2 DD, AVR ok (mean 13 mmHg), MAC, mild MR, mod LAE, mild RAE, PASP 46 mmHg (mild pulmo HTN)   Blood loss anemia    a. 05/2009 a/w GIB.   CAD (coronary artery disease)    a. Cath 04/2009: BMS to LCx 04/2009; CTO of RCA with L-R collaterals, 40% ostial diag.   Carpal tunnel syndrome    bilateral, carpal tunnel release x 2   Esophageal ulcer    a. 05/2009 with hemorrhage - injected with epinephrine-Dr. Molly Maduro Buccini.   Esophagitis    a. 05/2009 a/w GIB.   GERD (gastroesophageal reflux disease)    EGD, Dr. Tyler Aas 2008   GI bleed    a. 05/2009 as above: with associated anemia. Lower esophageal ulcer with extensive erosive esophagitis and large clot by EGD 05/2009.    Hx of colonoscopy 2008   Dr. Tyler Aas, polyps- 5 yr f/u recommended   Hyperlipidemia    Hypertension    PAF (paroxysmal atrial fibrillation) (HCC)    a. Post-op AVR;  b. 04/2014 recurrent.   Perforated ear drum    right ear drum, tube in left ear drum, Dr. Pollyann Kennedy   Polymyalgia rheumatica [facility])    Prostate cancer (HCC)    Vitamin D deficiency      Medications:  (Not in a hospital admission)  Scheduled:   [START ON 04/25/2023] digoxin  0.125 mg Oral QPM   guaiFENesin  600 mg Oral BID   [START ON 04/25/2023] metoprolol succinate  50 mg Oral BID   [START ON 04/25/2023] oseltamivir  30 mg Oral BID   pantoprazole  40 mg Oral Daily   rosuvastatin  20 mg Oral QPM   Infusions:  PRN: acetaminophen **OR** acetaminophen, benzonatate, melatonin, menthol-cetylpyridinium, ondansetron (ZOFRAN) IV  Assessment: 89 yom with a history of chronic rt sided pleural effusion, severe aortic stenosis s/p AVR, CAD s/p CABG, AF, sick sinus syndrome s/p pacemaker, HTN, HLD, GERD, anemia. Patient is presenting with SOB. Noted to be flu positive. Warfarin per pharmacy consult placed for atrial fibrillation.  Patient taking warfarin prior to arrival. Home dose is 5mg  daily. Last taken 2/20pm.  Notable interacting medications: Tamiflu has been started this admission.   PT / INR today is 21.5 / 1.8, which is sub-therapeutic Hgb 10.2; plt 118  Goal of Therapy:  INR Goal 2-3 Monitor platelets by anticoagulation protocol: Yes   Plan:  Will give 5mg  warfarin today. Monitor for s/s of hemorrhage, daily INR, CBC Watch for new  DDIs  Delmar Landau, PharmD, BCPS 04/24/2023 8:49 PM ED Clinical Pharmacist -  [phone]

## 2023-04-24 NOTE — ED Notes (Signed)
Pt ambulated with pulse ox and steady gate. Sats dropped while ambulating, lowest was 88  highest was 96.

## 2023-04-25 DIAGNOSIS — J101 Influenza due to other identified influenza virus with other respiratory manifestations: Secondary | ICD-10-CM | POA: Diagnosis not present

## 2023-04-25 LAB — CBC WITH DIFFERENTIAL/PLATELET
Abs Immature Granulocytes: 0.02 10*3/uL (ref 0.00–0.07)
Basophils Absolute: 0 10*3/uL (ref 0.0–0.1)
Basophils Relative: 0 %
Eosinophils Absolute: 0 10*3/uL (ref 0.0–0.5)
Eosinophils Relative: 1 %
HCT: 30.5 % — ABNORMAL LOW (ref 39.0–52.0)
Hemoglobin: 9.7 g/dL — ABNORMAL LOW (ref 13.0–17.0)
Immature Granulocytes: 0 %
Lymphocytes Relative: 28 %
Lymphs Abs: 1.7 10*3/uL (ref 0.7–4.0)
MCH: 31.8 pg (ref 26.0–34.0)
MCHC: 31.8 g/dL (ref 30.0–36.0)
MCV: 100 fL (ref 80.0–100.0)
Monocytes Absolute: 0.5 10*3/uL (ref 0.1–1.0)
Monocytes Relative: 8 %
Neutro Abs: 3.8 10*3/uL (ref 1.7–7.7)
Neutrophils Relative %: 63 %
Platelets: 90 10*3/uL — ABNORMAL LOW (ref 150–400)
RBC: 3.05 MIL/uL — ABNORMAL LOW (ref 4.22–5.81)
RDW: 16.8 % — ABNORMAL HIGH (ref 11.5–15.5)
WBC: 6 10*3/uL (ref 4.0–10.5)
nRBC: 0 % (ref 0.0–0.2)

## 2023-04-25 LAB — COMPREHENSIVE METABOLIC PANEL
ALT: 16 U/L (ref 0–44)
AST: 20 U/L (ref 15–41)
Albumin: 3.7 g/dL (ref 3.5–5.0)
Alkaline Phosphatase: 19 U/L — ABNORMAL LOW (ref 38–126)
Anion gap: 11 (ref 5–15)
BUN: 22 mg/dL (ref 8–23)
CO2: 26 mmol/L (ref 22–32)
Calcium: 8.8 mg/dL — ABNORMAL LOW (ref 8.9–10.3)
Chloride: 104 mmol/L (ref 98–111)
Creatinine, Ser: 0.91 mg/dL (ref 0.61–1.24)
GFR, Estimated: >60 mL/min (ref >60)
Glucose, Bld: 105 mg/dL — ABNORMAL HIGH (ref 70–99)
Potassium: 3.7 mmol/L (ref 3.5–5.1)
Sodium: 141 mmol/L (ref 135–145)
Total Bilirubin: 0.8 mg/dL (ref 0.0–1.2)
Total Protein: 5.9 g/dL — ABNORMAL LOW (ref 6.5–8.1)

## 2023-04-25 LAB — PROTIME-INR
INR: 1.9 — ABNORMAL HIGH (ref 0.8–1.2)
Prothrombin Time: 21.7 s — ABNORMAL HIGH (ref 11.4–15.2)

## 2023-04-25 LAB — PHOSPHORUS: Phosphorus: 3.8 mg/dL (ref 2.5–4.6)

## 2023-04-25 LAB — TROPONIN I (HIGH SENSITIVITY): Troponin I (High Sensitivity): 55 ng/L — ABNORMAL HIGH (ref <18)

## 2023-04-25 LAB — MAGNESIUM: Magnesium: 1.8 mg/dL (ref 1.7–2.4)

## 2023-04-25 LAB — PROCALCITONIN: Procalcitonin: <0.1 ng/mL

## 2023-04-25 LAB — BRAIN NATRIURETIC PEPTIDE: B Natriuretic Peptide: 802 pg/mL — ABNORMAL HIGH (ref 0.0–100.0)

## 2023-04-25 MED ORDER — ALBUTEROL SULFATE HFA 108 (90 BASE) MCG/ACT IN AERS: 2.0000 | INHALATION_SPRAY | Freq: Four times a day (QID) | RESPIRATORY_TRACT | 2 refills | Status: DC | PRN

## 2023-04-25 MED ORDER — GUAIFENESIN ER 600 MG PO TB12: 600.0000 mg | ORAL_TABLET | Freq: Two times a day (BID) | ORAL | 0 refills | Status: DC

## 2023-04-25 MED ORDER — WARFARIN SODIUM 5 MG PO TABS: 5.0000 mg | ORAL_TABLET | Freq: Once | ORAL | Status: DC

## 2023-04-25 MED ORDER — DIGOXIN 125 MCG PO TABS: 0.1250 mg | ORAL_TABLET | Freq: Every evening | ORAL | Status: DC

## 2023-04-25 MED ORDER — PREDNISONE 20 MG PO TABS: 20.0000 mg | ORAL_TABLET | Freq: Every day | ORAL | 0 refills | Status: DC

## 2023-04-25 MED ORDER — BENZONATATE 200 MG PO CAPS: 200.0000 mg | ORAL_CAPSULE | Freq: Three times a day (TID) | ORAL | 0 refills | Status: DC | PRN

## 2023-04-25 MED ORDER — OSELTAMIVIR PHOSPHATE 30 MG PO CAPS: 30.0000 mg | ORAL_CAPSULE | Freq: Two times a day (BID) | ORAL | 0 refills | Status: DC

## 2023-04-25 NOTE — Discharge Summary (Addendum)
 Physician Discharge Summary   Patient: Robert [name] MRN: [id number] DOB: May 16, 1927  Admit date:     04/24/2023  Discharge date: 04/25/23  Discharge Physician: Deanna Artis   PCP: Delma Officer, PA   Recommendations at discharge:   At this time patient will be discharged home.  If you experience any symptoms such as fever, vomiting, shortness of breath, chest pain, abdominal pain, or other concerning symptoms, please call your primary care provider or go to the emergency department immediately.  Discharge Diagnoses: Principal Problem:   Influenza A Active Problems:   Essential hypertension   HLD (hyperlipidemia)   Paroxysmal atrial fibrillation (HCC)   Acute respiratory failure with hypoxia (HCC)   Pleural effusion on right   Subtherapeutic international normalized ratio (INR)   GERD (gastroesophageal reflux disease)   Anemia of chronic disease  Resolved Problems:   * No resolved hospital problems. *  Hospital Course:  Robert [name] is a 89 y.o. male with medical history significant for chronic right-sided pleural effusion, severe aortic stenosis status post aortic valve replacement in March 2015, coronary artery disease status post CABG in March 2015, paroxysmal atrial fibrillation chronically anticoagulated on warfarin, complicated by sick sinus syndrome status post pacemaker placement in March 2022, essential pretension, hyperlipidemia, GERD, anemia of chronic disease associated baseline hemoglobin 9.5-11, who is admitted to [facility] on 04/24/2023 with influenza A infection complicated by acute hypoxic respiratory distress after presenting from home to [facility] ED complaining of shortness of breath.   Assessment and Plan:  Acute hypoxic respiratory failure secondary to acute influenza A - Initially requiring 2-3 L nasal cannula at rest.  Initiated on Tamiflu, scheduled nebulizer therapy.  Throughout the course of hospital stay was able to be weaned  down to room air.  Patient showed a remarkable recovery.  Patient ambulatory, eager for discharge home.  Discussed with patient, patient's wife, family.  No current need for supplemental oxygen need.  Recommend taking it easy for a couple days.  Will continue Tamiflu at home for total of 5 days.  Will give prescription for cough medicine as well as albuterol inhaler if needed.  Chronic right sided pleural effusion - Chest x-ray personally reviewed showing similar right-sided effusion.  Does not appear to be attributing to his hypoxia dyspnea.  Discussed with patient and wife that interventional thoracentesis not advised at this time.  Continue conservative management with diuretic therapy as they have been doing.  Paroxysmal atrial fibrillation/hypertension/GERD - Continue home medication regiment         Consultants: None Procedures performed: None Disposition: Home Diet recommendation:  Discharge Diet Orders (From admission, onward)     Start     Ordered   04/25/23 0000  Diet - low sodium heart healthy        04/25/23 1046           Cardiac and Carb modified diet DISCHARGE MEDICATION: Allergies as of 04/25/2023       Reactions   Amoxicillin Swelling   penile swelling   Lisinopril Cough   Kenalog [triamcinolone] Rash        Medication List     TAKE these medications    acetaminophen 325 MG tablet Commonly known as: TYLENOL Take 650 mg by mouth every 6 (six) hours as needed for moderate pain.   albuterol 108 (90 Base) MCG/ACT inhaler Commonly known as: VENTOLIN HFA Inhale 2 puffs into the lungs every 6 (six) hours as needed for wheezing or shortness of  breath.   benzonatate 200 MG capsule Commonly known as: TESSALON Take 1 capsule (200 mg total) by mouth 3 (three) times daily as needed for cough.   Dialyvite Vitamin D 5000 125 MCG (5000 UT) capsule Generic drug: Cholecalciferol Take 5,000 Units by mouth 2 (two) times a week.   digoxin 0.125 MG  tablet Commonly known as: LANOXIN Take 1 tablet (0.125 mg total) by mouth every evening.   furosemide 40 MG tablet Commonly known as: Lasix Take 1 tablet (40 mg total) by mouth 2 (two) times daily.   guaiFENesin 600 MG 12 hr tablet Commonly known as: MUCINEX Take 1 tablet (600 mg total) by mouth 2 (two) times daily.   Klor-Con M20 20 MEQ tablet Generic drug: potassium chloride SA Take 20 mEq by mouth daily.   losartan 50 MG tablet Commonly known as: COZAAR TAKE 1 TABLET DAILY   metoprolol succinate 50 MG 24 hr tablet Commonly known as: TOPROL-XL TAKE 1 TABLET IN THE MORNING AND AT BEDTIME   oseltamivir 30 MG capsule Commonly known as: TAMIFLU Take 1 capsule (30 mg total) by mouth 2 (two) times daily.   pantoprazole 40 MG tablet Commonly known as: PROTONIX Take 40 mg by mouth daily.   predniSONE 20 MG tablet Commonly known as: DELTASONE Take 1 tablet (20 mg total) by mouth daily with breakfast.   rosuvastatin 20 MG tablet Commonly known as: CRESTOR TAKE 1 TABLET DAILY What changed: when to take this   VITAMIN C PO Take 1 tablet by mouth daily.   warfarin 5 MG tablet Commonly known as: COUMADIN Take as directed. If you are unsure how to take this medication, talk to your nurse or doctor. Original instructions: TAKE 1 TABLET DAILY AS DIRECTED BY THE COUMADIN CLINIC.        Discharge Exam: Filed Weights   04/24/23 1414  Weight: 74.8 kg   GENERAL:  Alert, pleasant, no acute distress, appears younger than his stated age HEENT:  EOMI CARDIOVASCULAR:  RRR, no murmurs appreciated RESPIRATORY: Intermittent dry cough, poor air movement, no wheezing  GASTROINTESTINAL:  Soft, nontender, nondistended EXTREMITIES:  No LE edema bilaterally NEURO:  No new focal deficits appreciated SKIN: Dry skin PSYCH:  Appropriate mood and affect    Condition at discharge: improving  The results of significant diagnostics from this hospitalization (including imaging,  microbiology, ancillary and laboratory) are listed below for reference.   Imaging Studies: DG Chest 2 View Result Date: 04/24/2023 CLINICAL DATA:  Shortness of breath. EXAM: CHEST - 2 VIEW COMPARISON:  07/23/2022. FINDINGS: There is moderate right pleural effusion with probable associated compressive atelectatic changes. Bilateral lung fields are otherwise clear. Left costophrenic angle is clear. Evaluation of cardiomediastinal silhouette is nondiagnostic due to right lower hemithorax opacification. Sternotomy wires, prosthetic aortic valve and presumed left atrial appendage occlusion device again seen. There is a left sided 2-lead pacemaker. No acute osseous abnormalities. The soft tissues are within normal limits. IMPRESSION: *Moderate right pleural effusion with probable associated compressive atelectatic changes. Electronically Signed   By: Jules Schick M.D.   On: 04/24/2023 15:35   DG SWALLOW FUNC OP MEDICARE SPEECH PATH Result Date: 04/06/2023 Table formatting from the original result was not included. Modified Barium Swallow Study Patient Details Name: OJAS COONE MRN: [id number] Date of Birth: Jul 10, 1927 Today's Date: 04/06/2023 HPI/PMH: HPI: Pt is a 89 yo presenting for OP MBS due to reports of trouble swallowing. He noticed this more around the holidays when he said he was eating more "junk"  food. It has improved since then, and he also notes changing his reflux medications in this timeframe. Esophagram 03/06/23 revealed: small hiatal hernia, poor primary stripping wave with tertiary contractions contributing to delayed barium transit and moderate dysmotility, and barium tablet suspended in the esophagus. PMH also includes: GERD, esophageal ulcer, esophagitis, CHF, CAD Clinical Impression: Clinical Impression: Pt's oropharyngeal swallow is within gross functional limits. He has mild anterior spillage with liquids and mildly reduced UES opening that leads to trace residue within the UES with  purees. Penetration occurs with thin and nectar thick liquids due to timing of airway closure, but he achieves good laryngeal vestibule closure and can squeeze penetrates back out (PAS 2, considered to be normal). The one time this did not clear as the swallow was completed, the pt performed a spontaneous throat clear, which was effective. No overt reason for his symptoms are identified. He was given education about esophageal precautions, as this may be a more likely source. No further SLP f/u indicated at this time. Factors that may increase risk of adverse event in presence of aspiration [name] & [name] 2021): Factors that may increase risk of adverse event in presence of aspiration [name] & [name] 2021): Respiratory or GI disease Recommendations/Plan: Swallowing Evaluation Recommendations Swallowing Evaluation Recommendations Recommendations: PO diet PO Diet Recommendation: Regular; Thin liquids (Level 0) Liquid Administration via: Cup; Straw Medication Administration: Whole meds with liquid Supervision: Patient able to self-feed Swallowing strategies  : Slow rate; Small bites/sips; Follow solids with liquids Postural changes: Position pt fully upright for meals; Stay upright 30-60 min after meals Oral care recommendations: Oral care BID (2x/day) Treatment Plan Treatment Plan Treatment recommendations: No treatment recommended at this time Follow-up recommendations: No SLP follow up Functional status assessment: Patient has not had a recent decline in their functional status. Recommendations Recommendations for follow up therapy are one component of a multi-disciplinary discharge planning process, led by the attending physician.  Recommendations may be updated based on patient status, additional functional criteria and insurance authorization. Assessment: Orofacial Exam: Orofacial Exam Oral Cavity: Oral Hygiene: WFL Oral Cavity - Dentition: Dentures, top; Dentures, bottom Orofacial Anatomy: WFL Oral  Motor/Sensory Function: WFL Anatomy: Anatomy: WFL (some calcification noted at baseline around the area of the laryngeal vestibule) Boluses Administered: Boluses Administered Boluses Administered: Thin liquids (Level 0); Mildly thick liquids (Level 2, nectar thick); Moderately thick liquids (Level 3, honey thick); Puree; Solid  Oral Impairment Domain: Oral Impairment Domain Lip Closure: No labial escape Tongue control during bolus hold: Posterior escape of less than half of bolus Bolus preparation/mastication: Timely and efficient chewing and mashing Bolus transport/lingual motion: Brisk tongue motion Oral residue: Trace residue lining oral structures Location of oral residue : Tongue Initiation of pharyngeal swallow : Pyriform sinuses  Pharyngeal Impairment Domain: Pharyngeal Impairment Domain Soft palate elevation: No bolus between soft palate (SP)/pharyngeal wall (PW) Laryngeal elevation: Complete superior movement of thyroid cartilage with complete approximation of arytenoids to epiglottic petiole Anterior hyoid excursion: Complete anterior movement Epiglottic movement: Complete inversion Laryngeal vestibule closure: Complete, no air/contrast in laryngeal vestibule Pharyngeal stripping wave : Present - complete Pharyngeal contraction (A/P view only): N/A Pharyngoesophageal segment opening: Partial distention/partial duration, partial obstruction of flow Tongue base retraction: No contrast between tongue base and posterior pharyngeal wall (PPW) Pharyngeal residue: Trace residue within or on pharyngeal structures Location of pharyngeal residue: -- (UES)  Esophageal Impairment Domain: Esophageal Impairment Domain Esophageal clearance upright position: Esophageal retention Pill: Pill Consistency administered: Thin liquids (Level 0) Thin liquids (Level 0):  [facility] Penetration/Aspiration Scale Score: Penetration/Aspiration Scale Score 1.  Material does not enter airway: Moderately thick liquids (Level 3, honey thick);  Puree; Solid; Pill 2.  Material enters airway, remains ABOVE vocal cords then ejected out: Thin liquids (Level 0); Mildly thick liquids (Level 2, nectar thick) Compensatory Strategies: No data recorded  General Information: Caregiver present: No  Diet Prior to this Study: Regular; Thin liquids (Level 0)   No data recorded  Respiratory Status: WFL   Supplemental O2: None (Room air)   History of Recent Intubation: No  Behavior/Cognition: Alert; Cooperative; Pleasant mood Self-Feeding Abilities: Able to self-feed Baseline vocal quality/speech: Normal Volitional Cough: Able to elicit Volitional Swallow: Able to elicit Exam Limitations: No limitations Goal Planning: No data recorded No data recorded No data recorded Patient/Family Stated Goal: to assess swallowing Consulted and agree with results and recommendations: Patient Pain: Pain Assessment Pain Assessment: Faces Faces Pain Scale: 0 End of Session: Start Time:SLP Start Time (ACUTE ONLY): 1141 Stop Time: SLP Stop Time (ACUTE ONLY): 1205 Time Calculation:SLP Time Calculation (min) (ACUTE ONLY): 24 min Charges: SLP Evaluations $ SLP Speech Visit: 1 Visit SLP Evaluations $Outpatient MBS Swallow: 1 Procedure SLP visit diagnosis: SLP Visit Diagnosis: Dysphagia, unspecified (R13.10) Past Medical History: Past Medical History: Diagnosis Date  Aortic stenosis   a. s/p tissue AVR 05/2013;  b. Echo (06/2013):  Mod LVH, EF 60-65%, no RWMA, Gr 2 DD, AVR ok (mean 13 mmHg), MAC, mild MR, mod LAE, mild RAE, PASP 46 mmHg (mild pulmo HTN)  Blood loss anemia   a. 05/2009 a/w GIB.  CAD (coronary artery disease)   a. Cath 04/2009: BMS to LCx 04/2009; CTO of RCA with L-R collaterals, 40% ostial diag.  Carpal tunnel syndrome   bilateral, carpal tunnel release x 2  Esophageal ulcer   a. 05/2009 with hemorrhage - injected with epinephrine-Dr. Molly Maduro Buccini.  Esophagitis   a. 05/2009 a/w GIB.  GERD (gastroesophageal reflux disease)   EGD, Dr. Tyler Aas 2008  GI bleed   a. 05/2009 as above:  with associated anemia. Lower esophageal ulcer with extensive erosive esophagitis and large clot by EGD 05/2009.   Hx of colonoscopy 2008  Dr. Tyler Aas, polyps- 5 yr f/u recommended  Hyperlipidemia   Hypertension   PAF (paroxysmal atrial fibrillation) (HCC)   a. Post-op AVR;  b. 04/2014 recurrent.  Perforated ear drum   right ear drum, tube in left ear drum, Dr. Pollyann Kennedy  Polymyalgia rheumatica [facility])   Prostate cancer [facility])   Vitamin D deficiency  Past Surgical History: Past Surgical History: Procedure Laterality Date  AORTIC VALVE REPLACEMENT N/A 05/12/2013  Procedure: AORTIC VALVE REPLACEMENT (AVR);  Surgeon: Kerin Perna, MD;  Location: [facility] OR;  Service: Open Heart Surgery;  Laterality: N/A;  CARDIAC CATHETERIZATION  2011  CARDIOVERSION N/A 04/12/2020  Procedure: CARDIOVERSION;  Surgeon: Lars Masson, MD;  Location: [facility] ENDOSCOPY;  Service: Cardiovascular;  Laterality: N/A;  CORONARY ARTERY BYPASS GRAFT N/A 05/12/2013  Procedure: CORONARY ARTERY BYPASS GRAFTING (CABG);  Surgeon: Kerin Perna, MD;  Location: [facility] OR;  Service: Open Heart Surgery;  Laterality: N/A;  CABG x 1 using left leg greater saphenous vein harvested endoscopically  INTRAOPERATIVE TRANSESOPHAGEAL ECHOCARDIOGRAM N/A 05/12/2013  Procedure: INTRAOPERATIVE TRANSESOPHAGEAL ECHOCARDIOGRAM;  Surgeon: Kerin Perna, MD;  Location: [facility] OR;  Service: Open Heart Surgery;  Laterality: N/A;  LEFT AND RIGHT HEART CATHETERIZATION WITH CORONARY ANGIOGRAM N/A 05/09/2013  Procedure: LEFT AND RIGHT HEART CATHETERIZATION WITH CORONARY ANGIOGRAM;  Surgeon: Runell Gess, MD;  Location[facility]  CATH LAB;  Service: Cardiovascular;  Laterality: N/A;  MAZE N/A 05/12/2013  Procedure: MAZE;  Surgeon: Kerin Perna, MD;  Location: [facility] OR;  Service: Open Heart Surgery;  Laterality: N/A;  PACEMAKER IMPLANT N/A 05/21/2020  Procedure: PACEMAKER IMPLANT;  Surgeon: Marinus Maw, MD;  Location: [facility] INVASIVE CV LAB;  Service: Cardiovascular;  Laterality: N/A;  PROSTATECTOMY     TEE WITHOUT CARDIOVERSION N/A 04/12/2020  Procedure: TRANSESOPHAGEAL ECHOCARDIOGRAM (TEE);  Surgeon: Lars Masson, MD;  Location: [facility] ENDOSCOPY;  Service: Cardiovascular;  Laterality: N/A; Mahala Menghini., M.A. CCC-SLP Acute Rehabilitation Services Office [phone] Secure chat preferred 04/06/2023, 3:25 PM CLINICAL DATA:  89 year old male with dysphagia, trouble swallowing. EXAM: MODIFIED BARIUM SWALLOW TECHNIQUE: Different consistencies of barium were administered orally to the patient by the Speech Pathologist. Imaging of the pharynx was performed in the lateral projection. Video fluoroscopic (cine) images were acquired. Buzzy Han, PA-C was present in the fluoroscopy room during this study, which was supervised and interpreted by Roanna Banning, MD. FLUOROSCOPY: Radiation Exposure Index (as provided by the fluoroscopic device): 16.01 mGy Kerma COMPARISON:  None Available. FINDINGS: Vestibular  Penetration:  Observed with all consistencies Aspiration:  None seen. Other:  None. IMPRESSION: Laryngeal penetration with all consistencies. No frank tracheal aspiration. Please refer to the Speech Pathologists report for complete details and recommendations. Procedure performed by Buzzy Han, PA-C Electronically Signed   By: Roanna Banning M.D.   On: 04/06/2023 14:21   Microbiology: Results for orders placed or performed during the hospital encounter of 04/24/23  Resp panel by RT-PCR (RSV, Flu A&B, Covid)     Status: Abnormal   Collection Time: 04/24/23  4:16 PM   Specimen: Nasal Swab  Result Value Ref Range Status   SARS Coronavirus 2 by RT PCR NEGATIVE NEGATIVE Final   Influenza A by PCR POSITIVE (A) NEGATIVE Final   Influenza B by PCR NEGATIVE NEGATIVE Final    Comment: (NOTE) The Xpert Xpress SARS-CoV-2/FLU/RSV plus assay is intended as an aid in the diagnosis of influenza from Nasopharyngeal swab specimens and should not be used as a sole basis for treatment. Nasal washings and aspirates are  unacceptable for Xpert Xpress SARS-CoV-2/FLU/RSV testing.  Fact Sheet for Patients: BloggerCourse.com  Fact Sheet for Healthcare Providers: SeriousBroker.it  This test is not yet approved or cleared by the [place] FDA and has been authorized for detection and/or diagnosis of SARS-CoV-2 by FDA under an Emergency Use Authorization (EUA). This EUA will remain in effect (meaning this test can be used) for the duration of the COVID-19 declaration under Section 564(b)(1) of the Act, 21 U.S.C. section 360bbb-3(b)(1), unless the authorization is terminated or revoked.     Resp Syncytial Virus by PCR NEGATIVE NEGATIVE Final    Comment: (NOTE) Fact Sheet for Patients: BloggerCourse.com  Fact Sheet for Healthcare Providers: SeriousBroker.it  This test is not yet approved or cleared by the [place] FDA and has been authorized for detection and/or diagnosis of SARS-CoV-2 by FDA under an Emergency Use Authorization (EUA). This EUA will remain in effect (meaning this test can be used) for the duration of the COVID-19 declaration under Section 564(b)(1) of the Act, 21 U.S.C. section 360bbb-3(b)(1), unless the authorization is terminated or revoked.  Performed at [facility], [address] [place], [place] [address]   Culture, blood (routine x 2)     Status: None (Preliminary result)   Collection Time: 04/24/23  4:54 PM   Specimen: BLOOD RIGHT FOREARM  Result Value Ref Range  Status   Specimen Description BLOOD RIGHT FOREARM  Final   Special Requests   Final    BOTTLES DRAWN AEROBIC AND ANAEROBIC Blood Culture adequate volume   Culture   Final    NO GROWTH < 24 HOURS Performed at [facility], [address], [place], [place] [address]    Report Status PENDING  Incomplete  Culture, blood (routine x 2)     Status: None (Preliminary result)   Collection Time:  04/24/23  5:05 PM   Specimen: BLOOD  Result Value Ref Range Status   Specimen Description BLOOD LEFT ANTECUBITAL  Final   Special Requests   Final    BOTTLES DRAWN AEROBIC AND ANAEROBIC Blood Culture results may not be optimal due to an inadequate volume of blood received in culture bottles   Culture   Final    NO GROWTH < 24 HOURS Performed at [facility], [address], [place], [place] [address]    Report Status PENDING  Incomplete    Labs: CBC: Recent Labs  Lab 04/21/23 1033 04/24/23 1419 04/25/23 0533  WBC 8.5 8.5 6.0  NEUTROABS  --  5.7 3.8  HGB 10.7* 10.2* 9.7*  HCT 32.8* 31.1* 30.5*  MCV 97 98.4 100.0  PLT 141* 118* 90*   Basic Metabolic Panel: Recent Labs  Lab 04/21/23 1033 04/24/23 1419 04/24/23 2049 04/25/23 0533  NA 143 141  --  141  K 4.4 4.1  --  3.7  CL 101 100  --  104  CO2 28 29  --  26  GLUCOSE 93 113*  --  105*  BUN 27 22  --  22  CREATININE 0.87 0.93  --  0.91  CALCIUM 9.3 9.5  --  8.8*  MG  --   --  1.7 1.8  PHOS  --   --   --  3.8   Liver Function Tests: Recent Labs  Lab 04/21/23 1033 04/24/23 1419 04/25/23 0533  AST 20 26 20   ALT 16 21 16   ALKPHOS 31* 27* 19*  BILITOT 0.7 1.1 0.8  PROT 5.9* 6.7 5.9*  ALBUMIN 4.4 4.4 3.7   CBG: No results for input(s): "GLUCAP" in the last 168 hours.  Discharge time spent: less than 30 minutes.  Signed: Deanna Artis, DO [facility] 04/25/2023

## 2023-04-25 NOTE — Progress Notes (Signed)
 ANTICOAGULATION CONSULT NOTE  Pharmacy Consult for Warfarin Indication: atrial fibrillation  Allergies  Allergen Reactions   Amoxicillin Swelling    penile swelling   Lisinopril Cough   Kenalog [Triamcinolone] Rash    Patient Measurements: Height: 5\' 9"  (175.3 cm) Weight: 74.8 kg (165 lb) IBW/kg (Calculated) : 70.7  Vital Signs: Temp: 98.7 F (37.1 C) (02/22 0953) Temp Source: Oral (02/22 0953) BP: 116/62 (02/22 0930) Pulse Rate: 73 (02/22 0930)  Labs: Recent Labs    04/24/23 1419 04/24/23 1654 04/24/23 2049 04/24/23 2317 04/25/23 0533  HGB 10.2*  --   --   --  9.7*  HCT 31.1*  --   --   --  30.5*  PLT 118*  --   --   --  90*  LABPROT  --  21.5*  --   --  21.7*  INR  --  1.8*  --   --  1.9*  CREATININE 0.93  --   --   --  0.91  TROPONINIHS  --   --  52* 55*  --     Estimated Creatinine Clearance: 50.7 mL/min (by C-G formula based on SCr of 0.91 mg/dL).   Medical History: Past Medical History:  Diagnosis Date   Aortic stenosis    a. s/p tissue AVR 05/2013;  b. Echo (06/2013):  Mod LVH, EF 60-65%, no RWMA, Gr 2 DD, AVR ok (mean 13 mmHg), MAC, mild MR, mod LAE, mild RAE, PASP 46 mmHg (mild pulmo HTN)   Blood loss anemia    a. 05/2009 a/w GIB.   CAD (coronary artery disease)    a. Cath 04/2009: BMS to LCx 04/2009; CTO of RCA with L-R collaterals, 40% ostial diag.   Carpal tunnel syndrome    bilateral, carpal tunnel release x 2   Esophageal ulcer    a. 05/2009 with hemorrhage - injected with epinephrine-Dr. Molly Maduro Buccini.   Esophagitis    a. 05/2009 a/w GIB.   GERD (gastroesophageal reflux disease)    EGD, Dr. Tyler Aas 2008   GI bleed    a. 05/2009 as above: with associated anemia. Lower esophageal ulcer with extensive erosive esophagitis and large clot by EGD 05/2009.    Hx of colonoscopy 2008   Dr. Tyler Aas, polyps- 5 yr f/u recommended   Hyperlipidemia    Hypertension    PAF (paroxysmal atrial fibrillation) (HCC)    a. Post-op AVR;  b. 04/2014  recurrent.   Perforated ear drum    right ear drum, tube in left ear drum, Dr. Pollyann Kennedy   Polymyalgia rheumatica [facility])    Prostate cancer (HCC)    Vitamin D deficiency     Medications:  (Not in a hospital admission)  Scheduled:   digoxin  0.125 mg Oral QPM   guaiFENesin  600 mg Oral BID   metoprolol succinate  50 mg Oral BID   oseltamivir  30 mg Oral BID   pantoprazole  40 mg Oral Daily   rosuvastatin  20 mg Oral QPM   Warfarin - Pharmacist Dosing Inpatient   Does not apply q1600   Infusions:  PRN: acetaminophen **OR** acetaminophen, benzonatate, melatonin, menthol-cetylpyridinium, ondansetron (ZOFRAN) IV  Assessment: 89 yom with a history of chronic rt sided pleural effusion, severe aortic stenosis s/p AVR, CAD s/p CABG, AF, sick sinus syndrome s/p pacemaker, HTN, HLD, GERD, anemia. Patient is presenting with SOB. Noted to be flu positive. Warfarin per pharmacy consult placed for atrial fibrillation.  Patient taking warfarin prior to arrival. Home dose is 5mg   daily. Last taken 2/20pm.  Notable interacting medications: Tamiflu has been started this admission.   INR 1.9 subtherapeutic this AM  Goal of Therapy:  INR Goal 2-3 Monitor platelets by anticoagulation protocol: Yes   Plan:  Warfarin 5mg  PO x 1 today Daily INR, s/s bleeding  Daylene Posey, PharmD, [facility] Clinical Pharmacist ED Pharmacist Phone # [phone] 04/25/2023 10:09 AM

## 2023-04-25 NOTE — ED Notes (Signed)
 Please update the daughter 69 [name]

## 2023-04-25 NOTE — ED Notes (Signed)
 Pt ambulated around the nurses station on RA, pulse ox started at 96% dropped to 83% for a few seconds and returned to 96% while walking back to the room. Pt states he felt "a little" sob. Heart rate remained in the 68-70 while ambulating. Provider notified.

## 2023-04-25 NOTE — ED Notes (Signed)
 Pt did a few laps within the room without o2. When walking it wasn't a good wave from so when stopped for 2 seconds his o2 is 88% then starts to come back up to 96%. We did it twice and same result both times.

## 2023-04-25 NOTE — ED Notes (Signed)
 Pt given incentive spirometer and education and instruction on use given and pt able to demonstrate proper use back as well as family members.

## 2023-04-28 ENCOUNTER — Ambulatory Visit (HOSPITAL_COMMUNITY): Payer: Medicare Other

## 2023-04-29 LAB — CULTURE, BLOOD (ROUTINE X 2)
Culture: NO GROWTH
Culture: NO GROWTH
Special Requests: ADEQUATE

## 2023-05-05 ENCOUNTER — Ambulatory Visit: Payer: Medicare Other | Attending: Physician Assistant

## 2023-05-05 DIAGNOSIS — I480 Paroxysmal atrial fibrillation: Secondary | ICD-10-CM | POA: Insufficient documentation

## 2023-05-05 LAB — POCT INR: INR: 2.8 (ref 2.0–3.0)

## 2023-05-05 NOTE — Patient Instructions (Signed)
 Description   Continue taking warfarin 1 tablet daily.  Recheck INR in 4 weeks.  Stay consistent with Ensures (3 times per week)  Call if scheduled for any procedures or has any new medications  [phone]

## 2023-05-07 ENCOUNTER — Ambulatory Visit: Payer: Medicare Other | Admitting: Physician Assistant

## 2023-05-14 ENCOUNTER — Other Ambulatory Visit: Payer: Self-pay

## 2023-05-14 DIAGNOSIS — C9110 Chronic lymphocytic leukemia of B-cell type not having achieved remission: Secondary | ICD-10-CM

## 2023-05-15 ENCOUNTER — Inpatient Hospital Stay: Payer: Medicare Other | Admitting: Hematology

## 2023-05-15 ENCOUNTER — Inpatient Hospital Stay: Payer: Medicare Other | Attending: Hematology

## 2023-05-15 DIAGNOSIS — Z8546 Personal history of malignant neoplasm of prostate: Secondary | ICD-10-CM | POA: Insufficient documentation

## 2023-05-15 DIAGNOSIS — C9110 Chronic lymphocytic leukemia of B-cell type not having achieved remission: Secondary | ICD-10-CM | POA: Insufficient documentation

## 2023-05-15 LAB — CBC WITH DIFFERENTIAL (CANCER CENTER ONLY)
Abs Immature Granulocytes: 0.03 10*3/uL (ref 0.00–0.07)
Basophils Absolute: 0.1 10*3/uL (ref 0.0–0.1)
Basophils Relative: 1 %
Eosinophils Absolute: 0.1 10*3/uL (ref 0.0–0.5)
Eosinophils Relative: 1 %
HCT: 32.4 % — ABNORMAL LOW (ref 39.0–52.0)
Hemoglobin: 10.6 g/dL — ABNORMAL LOW (ref 13.0–17.0)
Immature Granulocytes: 0 %
Lymphocytes Relative: 41 %
Lymphs Abs: 4.1 10*3/uL — ABNORMAL HIGH (ref 0.7–4.0)
MCH: 32.2 pg (ref 26.0–34.0)
MCHC: 32.7 g/dL (ref 30.0–36.0)
MCV: 98.5 fL (ref 80.0–100.0)
Monocytes Absolute: 0.8 10*3/uL (ref 0.1–1.0)
Monocytes Relative: 8 %
Neutro Abs: 4.8 10*3/uL (ref 1.7–7.7)
Neutrophils Relative %: 49 %
Platelet Count: 146 10*3/uL — ABNORMAL LOW (ref 150–400)
RBC: 3.29 MIL/uL — ABNORMAL LOW (ref 4.22–5.81)
RDW: 16.1 % — ABNORMAL HIGH (ref 11.5–15.5)
WBC Count: 9.9 10*3/uL (ref 4.0–10.5)
nRBC: 0 % (ref 0.0–0.2)

## 2023-05-15 LAB — CMP (CANCER CENTER ONLY)
ALT: 13 U/L (ref 0–44)
AST: 18 U/L (ref 15–41)
Albumin: 4.5 g/dL (ref 3.5–5.0)
Alkaline Phosphatase: 28 U/L — ABNORMAL LOW (ref 38–126)
Anion gap: 6 (ref 5–15)
BUN: 34 mg/dL — ABNORMAL HIGH (ref 8–23)
CO2: 33 mmol/L — ABNORMAL HIGH (ref 22–32)
Calcium: 9.2 mg/dL (ref 8.9–10.3)
Chloride: 105 mmol/L (ref 98–111)
Creatinine: 1.07 mg/dL (ref 0.61–1.24)
GFR, Estimated: >60 mL/min (ref >60)
Glucose, Bld: 102 mg/dL — ABNORMAL HIGH (ref 70–99)
Potassium: 4.3 mmol/L (ref 3.5–5.1)
Sodium: 144 mmol/L (ref 135–145)
Total Bilirubin: 0.7 mg/dL (ref 0.0–1.2)
Total Protein: 6.6 g/dL (ref 6.5–8.1)

## 2023-05-15 LAB — LACTATE DEHYDROGENASE: LDH: 170 U/L (ref 98–192)

## 2023-05-19 ENCOUNTER — Ambulatory Visit (INDEPENDENT_AMBULATORY_CARE_PROVIDER_SITE_OTHER): Payer: Medicare Other

## 2023-05-19 DIAGNOSIS — I495 Sick sinus syndrome: Secondary | ICD-10-CM | POA: Diagnosis not present

## 2023-05-21 LAB — CUP PACEART REMOTE DEVICE CHECK
Battery Remaining Longevity: 102 mo
Battery Remaining Percentage: 79 %
Battery Voltage: 3.04 V
Brady Statistic RV Percent Paced: 39 %
Date Time Interrogation Session: 20250318020012
Implantable Lead Connection Status: 753985
Implantable Lead Connection Status: 753985
Implantable Lead Implant Date: 20220321
Implantable Lead Implant Date: 20220321
Implantable Lead Location: 753859
Implantable Lead Location: 753860
Implantable Pulse Generator Implant Date: 20220321
Lead Channel Impedance Value: 450 Ohm
Lead Channel Pacing Threshold Amplitude: 0.75 V
Lead Channel Pacing Threshold Pulse Width: 0.4 ms
Lead Channel Sensing Intrinsic Amplitude: 9.4 mV
Lead Channel Setting Pacing Amplitude: 2.5 V
Lead Channel Setting Pacing Pulse Width: 0.4 ms
Lead Channel Setting Sensing Sensitivity: 2 mV
Pulse Gen Model: 2272
Pulse Gen Serial Number: 3909458

## 2023-05-26 ENCOUNTER — Ambulatory Visit: Payer: Medicare Other | Admitting: Physician Assistant

## 2023-05-29 ENCOUNTER — Ambulatory Visit (HOSPITAL_COMMUNITY): Payer: Medicare Other | Attending: Internal Medicine

## 2023-05-29 DIAGNOSIS — R0602 Shortness of breath: Secondary | ICD-10-CM | POA: Diagnosis not present

## 2023-05-29 LAB — ECHOCARDIOGRAM COMPLETE
AR max vel: 1.27 cm2
AV Area VTI: 1.21 cm2
AV Area mean vel: 1.15 cm2
AV Mean grad: 12.8 mmHg
AV Peak grad: 23.9 mmHg
Ao pk vel: 2.45 m/s
MV M vel: 4.91 m/s
MV Peak grad: 96.5 mmHg
S' Lateral: 3.2 cm

## 2023-06-02 ENCOUNTER — Telehealth: Payer: Self-pay | Admitting: Internal Medicine

## 2023-06-02 ENCOUNTER — Ambulatory Visit: Attending: Physician Assistant

## 2023-06-02 DIAGNOSIS — I480 Paroxysmal atrial fibrillation: Secondary | ICD-10-CM | POA: Insufficient documentation

## 2023-06-02 LAB — POCT INR: INR: 2.8 (ref 2.0–3.0)

## 2023-06-02 NOTE — Telephone Encounter (Signed)
 Patient's daughter canceled f/u this month and will keep appointment with Dr. Izora Ribas in June.

## 2023-06-02 NOTE — Patient Instructions (Signed)
 Description   Continue taking warfarin 1 tablet daily.  Recheck INR in 5 weeks.  Stay consistent with Ensures (3 times per week)  Call if scheduled for any procedures or has any new medications  [phone]

## 2023-06-02 NOTE — Telephone Encounter (Signed)
 Pt's daughter Synetta Fail calling to get Echo results, requesting cb

## 2023-06-02 NOTE — Telephone Encounter (Signed)
 Spoke with pt's daughter and went over Dr. Trula Ore result note for the echo done recently. Pt's daughter was asking if the pt needed to keep the 1 month f/u appt with Tessa. Will forward to Dr. Lynnette Caffey to make that call.

## 2023-06-04 DIAGNOSIS — C61 Malignant neoplasm of prostate: Secondary | ICD-10-CM | POA: Diagnosis not present

## 2023-06-12 ENCOUNTER — Ambulatory Visit: Payer: Medicare Other | Admitting: Physician Assistant

## 2023-06-29 ENCOUNTER — Other Ambulatory Visit: Payer: Self-pay | Admitting: Interventional Cardiology

## 2023-07-06 NOTE — Addendum Note (Signed)
 Addended by: Lott Rouleau A on: 07/06/2023 10:40 AM   Modules accepted: Orders

## 2023-07-06 NOTE — Progress Notes (Signed)
 Remote pacemaker transmission.

## 2023-07-07 ENCOUNTER — Ambulatory Visit: Attending: Physician Assistant

## 2023-07-07 DIAGNOSIS — I480 Paroxysmal atrial fibrillation: Secondary | ICD-10-CM | POA: Insufficient documentation

## 2023-07-07 LAB — POCT INR: INR: 2.9 (ref 2.0–3.0)

## 2023-07-07 NOTE — Patient Instructions (Signed)
 Description   Continue taking warfarin 1 tablet daily.  Recheck INR in 5 weeks.  Stay consistent with Ensures (3 times per week)  Call if scheduled for any procedures or has any new medications  [phone]

## 2023-07-29 DIAGNOSIS — L570 Actinic keratosis: Secondary | ICD-10-CM | POA: Diagnosis not present

## 2023-07-29 DIAGNOSIS — C44329 Squamous cell carcinoma of skin of other parts of face: Secondary | ICD-10-CM | POA: Diagnosis not present

## 2023-07-29 DIAGNOSIS — D485 Neoplasm of uncertain behavior of skin: Secondary | ICD-10-CM | POA: Diagnosis not present

## 2023-07-29 DIAGNOSIS — L723 Sebaceous cyst: Secondary | ICD-10-CM | POA: Diagnosis not present

## 2023-07-29 DIAGNOSIS — Z85828 Personal history of other malignant neoplasm of skin: Secondary | ICD-10-CM | POA: Diagnosis not present

## 2023-08-11 ENCOUNTER — Ambulatory Visit: Attending: Physician Assistant

## 2023-08-11 DIAGNOSIS — Z951 Presence of aortocoronary bypass graft: Secondary | ICD-10-CM | POA: Diagnosis not present

## 2023-08-11 DIAGNOSIS — Z5181 Encounter for therapeutic drug level monitoring: Secondary | ICD-10-CM | POA: Insufficient documentation

## 2023-08-11 DIAGNOSIS — I5043 Acute on chronic combined systolic (congestive) and diastolic (congestive) heart failure: Secondary | ICD-10-CM | POA: Insufficient documentation

## 2023-08-11 DIAGNOSIS — I480 Paroxysmal atrial fibrillation: Secondary | ICD-10-CM | POA: Insufficient documentation

## 2023-08-11 DIAGNOSIS — I4819 Other persistent atrial fibrillation: Secondary | ICD-10-CM | POA: Insufficient documentation

## 2023-08-11 DIAGNOSIS — Z952 Presence of prosthetic heart valve: Secondary | ICD-10-CM | POA: Diagnosis present

## 2023-08-11 LAB — POCT INR: INR: 3.2 — AB (ref 2.0–3.0)

## 2023-08-11 NOTE — Patient Instructions (Signed)
 Description   Only take 1/2 tablet today and then Continue taking warfarin 1 tablet daily.  Recheck INR in 4 weeks.  Stay consistent with Ensures (3 times per week)  Call if scheduled for any procedures or has any new medications  [phone]

## 2023-08-12 ENCOUNTER — Ambulatory Visit (INDEPENDENT_AMBULATORY_CARE_PROVIDER_SITE_OTHER): Payer: Medicare Other | Admitting: Internal Medicine

## 2023-08-12 ENCOUNTER — Encounter: Payer: Self-pay | Admitting: Internal Medicine

## 2023-08-12 VITALS — BP 128/62 | HR 61 | Ht 63.0 in | Wt 169.2 lb

## 2023-08-12 DIAGNOSIS — I5043 Acute on chronic combined systolic (congestive) and diastolic (congestive) heart failure: Secondary | ICD-10-CM | POA: Diagnosis not present

## 2023-08-12 DIAGNOSIS — Z952 Presence of prosthetic heart valve: Secondary | ICD-10-CM

## 2023-08-12 DIAGNOSIS — Z5181 Encounter for therapeutic drug level monitoring: Secondary | ICD-10-CM

## 2023-08-12 DIAGNOSIS — Z951 Presence of aortocoronary bypass graft: Secondary | ICD-10-CM | POA: Diagnosis not present

## 2023-08-12 DIAGNOSIS — I480 Paroxysmal atrial fibrillation: Secondary | ICD-10-CM | POA: Diagnosis not present

## 2023-08-12 DIAGNOSIS — I4819 Other persistent atrial fibrillation: Secondary | ICD-10-CM | POA: Diagnosis not present

## 2023-08-12 DIAGNOSIS — I5042 Chronic combined systolic (congestive) and diastolic (congestive) heart failure: Secondary | ICD-10-CM | POA: Insufficient documentation

## 2023-08-12 MED ORDER — FUROSEMIDE 80 MG PO TABS: 80.0000 mg | ORAL_TABLET | Freq: Every day | ORAL | 3 refills | Status: DC

## 2023-08-12 MED ORDER — KLOR-CON M20 20 MEQ PO TBCR: 40.0000 meq | EXTENDED_RELEASE_TABLET | Freq: Every day | ORAL | 1 refills | Status: DC

## 2023-08-12 NOTE — Progress Notes (Signed)
 Cardiology Office Note:  .    Date:  08/12/2023  ID:  Robert [name], DOB August 25, 1927, MRN [id number] PCP: Karalee Oscar, PA  [facility] Providers Cardiologist:  None Electrophysiologist:  Manya Sells, MD     CC: LE Swelling  History of Present Illness: Robert [name] is a 89 y.o. male with coronary artery disease and aortic stenosis who presents with worsening leg swelling and shortness of breath.  He has experienced significant swelling in his legs and ankles for several years, with worsening over the past few months. The swelling is most pronounced when he sits for extended periods and then stands up.  He experiences severe shortness of breath, particularly at night, which has progressively worsened over the past year. This limits his ability to engage in activities such as mowing the lawn or walking more than 50 feet without difficulty. He states, 'I can't breathe mainly,' indicating a major limitation in his daily life.  He has a history of coronary artery disease, status post coronary artery bypass grafting (CABG) and aortic valve replacement in 2015. He also has paroxysmal atrial fibrillation, which has been well-controlled since the placement of a pacemaker. No chest pain is reported, and he does not recall significant chest pain even prior to his bypass surgery.  He is currently on blood thinners, which he believes are to prevent stroke, and takes Lasix  40 mg daily for fluid management. He also has non-Hodgkin's lymphoma and prostate cancer, for which he receives a shot every six months at [facility], though he is unsure of the medication's name.  He reports numbness in his feet, affecting his ability to drive safely, as he sometimes misses the brake pedal or accelerator. He attributes this to the swelling in his legs and is concerned about the impact on his independence.  He had an echocardiogram in March 2025 but is not aware of any specific  findings. He has been told he has a leaky heart valve but is not aware of any severe cardiac issues from this.   Relevant histories: .  Social  - comes with wife and daughter, [place] - former [employer] Patient ROS: As per HPI.   Studies Reviewed: .     Cardiac Studies & Procedures   ______________________________________________________________________________________________   STRESS TESTS  NM MYOCAR MULTI W/SPECT W 04/20/2009   ECHOCARDIOGRAM  ECHOCARDIOGRAM COMPLETE 05/29/2023  Narrative ECHOCARDIOGRAM REPORT    Patient Name:   Robert [name] Date of Exam: 05/29/2023 Medical Rec #:  [id number]            Height:       69.0 in Accession #:    [id number]           Weight:       165.0 lb Date of Birth:  February 10, 1928            BSA:          1.904 m Patient Age:    89 years             BP:           136/80 mmHg Patient Gender: M                    HR:           62 bpm. Exam Location:  [address]  Procedure: 2D Echo and Strain Analysis (Both Spectral and Color Flow Doppler were utilized during procedure).  Indications:  R06.02 SOB  History:        Patient has prior history of Echocardiogram examinations, most recent 05/28/2021. CHF, Prior CABG and Pacemaker, Arrythmias:Atrial Fibrillation, Signs/Symptoms:Fatigue; Risk Factors:Hypertension and Dyslipidemia. S/P AVR. Aortic atherosclerosis. Chronic kidney disease. Aortic Valve: 23 mm Edwards Magna-Ease pericardial valve is present in the aortic position. Procedure Date: 2015.  Sonographer:    Mylinda Asa RCS Referring Phys: ARUN K THUKKANI  IMPRESSIONS   1. Left ventricular ejection fraction, by estimation, is 60 to 65%. The left ventricle has normal function. The left ventricle has no regional wall motion abnormalities. There is severe left ventricular hypertrophy. Left ventricular diastolic function could not be evaluated. There is the interventricular septum is flattened in systole and diastole, consistent with  right ventricular pressure and volume overload. The average left ventricular global longitudinal strain is -20.7 %. 2. Right ventricular systolic function is mildly reduced. The right ventricular size is normal. There is severely elevated pulmonary artery systolic pressure. The estimated right ventricular systolic pressure is 75.2 mmHg. 3. Left atrial size was severely dilated. 4. Right atrial size was severely dilated. 5. Moderate pleural effusion in the right lateral region. 6. The mitral valve is degenerative. Mild to moderate mitral valve regurgitation. No evidence of mitral stenosis. Moderate to severe mitral annular calcification. 7. Tricuspid valve regurgitation is moderate. 8. The aortic valve has been repaired/replaced. Aortic valve regurgitation is not visualized. There is a 23 mm Edwards Magna-Ease pericardial valve present in the aortic position. Procedure Date: 2015. Aortic valve area, by VTI measures 1.21 cm. Aortic valve mean gradient measures 12.8 mmHg. Aortic valve Vmax measures 2.45 m/s. Peak gradient 23.9 mmHg, DI 0.29. 9. Aortic dilatation noted. There is mild dilatation of the ascending aorta, measuring 40 mm. 10. The inferior vena cava is dilated in size with <50% respiratory variability, suggesting right atrial pressure of 15 mmHg.  Comparison(s): 05/28/2021: LVEF 55-60%, 23 mm Magna-Ease AOV - MG 10.2 mmHg, DI 0.39, aorta 41 mm.  FINDINGS Left Ventricle: Left ventricular ejection fraction, by estimation, is 60 to 65%. The left ventricle has normal function. The left ventricle has no regional wall motion abnormalities. The average left ventricular global longitudinal strain is -20.7 %. The left ventricular internal cavity size was normal in size. There is severe left ventricular hypertrophy. Abnormal (paradoxical) septal motion, consistent with RV pacemaker and the interventricular septum is flattened in systole and diastole, consistent with right ventricular pressure and  volume overload. Left ventricular diastolic function could not be evaluated due to atrial fibrillation. Left ventricular diastolic function could not be evaluated.  Right Ventricle: The right ventricular size is normal. No increase in right ventricular wall thickness. Right ventricular systolic function is mildly reduced. There is severely elevated pulmonary artery systolic pressure. The tricuspid regurgitant velocity is 3.88 m/s, and with an assumed right atrial pressure of 15 mmHg, the estimated right ventricular systolic pressure is 75.2 mmHg.  Left Atrium: Left atrial size was severely dilated.  Right Atrium: Right atrial size was severely dilated.  Pericardium: There is no evidence of pericardial effusion.  Mitral Valve: The mitral valve is degenerative in appearance. There is moderate thickening of the anterior and posterior mitral valve leaflet(s). There is moderate calcification of the posterior and anterior mitral valve leaflet(s). Moderate to severe mitral annular calcification. Mild to moderate mitral valve regurgitation. No evidence of mitral valve stenosis.  Tricuspid Valve: The tricuspid valve is grossly normal. Tricuspid valve regurgitation is moderate.  Aortic Valve: The aortic valve has been repaired/replaced. Aortic valve  regurgitation is not visualized. Aortic valve mean gradient measures 12.8 mmHg. Aortic valve peak gradient measures 23.9 mmHg. Aortic valve area, by VTI measures 1.21 cm. There is a 23 mm Edwards Magna-Ease pericardial valve present in the aortic position. Procedure Date: 2015.  Pulmonic Valve: The pulmonic valve was grossly normal. Pulmonic valve regurgitation is mild.  Aorta: Aortic dilatation noted. There is mild dilatation of the ascending aorta, measuring 40 mm.  Venous: The inferior vena cava is dilated in size with less than 50% respiratory variability, suggesting right atrial pressure of 15 mmHg.  IAS/Shunts: No atrial level shunt detected by  color flow Doppler.  Additional Comments: A device lead is visualized. There is a moderate pleural effusion in the right lateral region.   LEFT VENTRICLE PLAX 2D LVIDd:         4.00 cm LVIDs:         3.20 cm   2D Longitudinal Strain LV PW:         1.50 cm   2D Strain GLS (A4C):   -16.6 % LV IVS:        1.50 cm   2D Strain GLS (A3C):   -25.1 % LVOT diam:     2.30 cm   2D Strain GLS (A2C):   -20.5 % LV SV:         60        2D Strain GLS Avg:     -20.7 % LV SV Index:   31 LVOT Area:     4.15 cm   RIGHT VENTRICLE RV Basal diam:  4.70 cm RV S prime:     8.16 cm/s TAPSE (M-mode): 1.2 cm RVSP:           75.2 mmHg  LEFT ATRIUM             Index        RIGHT ATRIUM            Index LA diam:        4.60 cm 2.42 cm/m   RA Pressure: 15.00 mmHg LA Vol (A2C):   68.5 ml 35.98 ml/m  RA Area:     28.90 cm LA Vol (A4C):   48.6 ml 25.53 ml/m  RA Volume:   97.10 ml   51.00 ml/m LA Biplane Vol: 59.2 ml 31.10 ml/m AORTIC VALVE                     PULMONIC VALVE AV Area (Vmax):    1.27 cm      PR End Diast Vel: 6.45 msec AV Area (Vmean):   1.15 cm AV Area (VTI):     1.21 cm AV Vmax:           244.60 cm/s AV Vmean:          163.800 cm/s AV VTI:            0.493 m AV Peak Grad:      23.9 mmHg AV Mean Grad:      12.8 mmHg LVOT Vmax:         74.73 cm/s LVOT Vmean:        45.267 cm/s LVOT VTI:          0.143 m LVOT/AV VTI ratio: 0.29  AORTA Ao Asc diam: 4.00 cm  MR Peak grad: 96.5 mmHg   TRICUSPID VALVE MR Mean grad: 51.2 mmHg   TR Peak grad:   60.2 mmHg MR Vmax:      491.20  cm/s TR Vmax:        388.00 cm/s MR Vmean:     327.2 cm/s  Estimated RAP:  15.00 mmHg RVSP:           75.2 mmHg  SHUNTS Systemic VTI:  0.14 m Systemic Diam: 2.30 cm  Dinah Franco MD Electronically signed by Dinah Franco MD Signature Date/Time: 05/29/2023/1:43:01 PM    Final   TEE  ECHO TEE 04/12/2020  Narrative TRANSESOPHOGEAL ECHO REPORT    Patient Name:   Robert [name] Date of Exam:  04/12/2020 Medical Rec #:  [id number]            Height:       69.0 in Accession #:    [id number]           Weight:       176.0 lb Date of Birth:  Jan 22, 1928            BSA:          1.957 m Patient Age:    89 years             BP:           92/53 mmHg Patient Gender: M                    HR:           57 bpm. Exam Location:  Outpatient  Procedure: Transesophageal Echo, Cardiac Doppler and Color Doppler  Indications:     I48.0 Paroxysmal atrial fibrillation  History:         Patient has prior history of Echocardiogram examinations, most recent 06/21/2013. CAD; Risk Factors:Hypertension and Dyslipidemia. GERD. Cancer. Aortic Valve: 23 mm Magna Ease pericardial valve valve is present in the aortic position. Procedure Date: 2015.  Sonographer:     Tiffany Dance Referring Phys:  [id number] CLINT R FENTON Diagnosing Phys: Christoper Crafts MD  PROCEDURE: The transesophogeal probe was passed without difficulty through the esophogus of the patient. Local oropharyngeal anesthetic was provided with Cetacaine. Sedation performed by different physician. The patient was monitored while under deep sedation. Anesthestetic sedation was provided intravenously by Anesthesiology: 237mg  of Propofol . The patient's vital signs; including heart rate, blood pressure, and oxygen saturation; remained stable throughout the procedure. The patient developed no complications during the procedure.  IMPRESSIONS   1. The patient doesn't have a residual left atrial appendage post clipping during his CABG/AVR/maze surgery in 2015. 2. Left ventricular ejection fraction, by estimation, is 50 to 55%. The left ventricle has low normal function. The left ventricle has no regional wall motion abnormalities. Left ventricular diastolic function could not be evaluated. 3. Right ventricular systolic function is normal. The right ventricular size is normal. 4. Left atrial appendage is clipped. Left atrial size was severely dilated. No  left atrial/left atrial appendage thrombus was detected. 5. Right atrial size was moderately dilated. 6. The mitral valve is normal in structure. Moderate mitral valve regurgitation. No evidence of mitral stenosis. 7. Tricuspid valve regurgitation is moderate. 8. The aortic valve has been repaired/replaced. Aortic valve regurgitation is not visualized. No aortic stenosis is present. There is a 23 mm Magna Ease pericardial valve valve present in the aortic position. Procedure Date: 2015. Echo findings are consistent with normal structure and function of the aortic valve prosthesis. Aortic valve mean gradient measures 8.0 mmHg. 9. The inferior vena cava is normal in size with greater than 50% respiratory variability, suggesting right atrial pressure of 3 mmHg.  Conclusion(s)/Recommendation(s):  No LA/LAA thrombus identified. Successful cardioversion performed with restoration of normal sinus rhythm.  FINDINGS Left Ventricle: Left ventricular ejection fraction, by estimation, is 50 to 55%. The left ventricle has low normal function. The left ventricle has no regional wall motion abnormalities. The left ventricular internal cavity size was normal in size. There is no left ventricular hypertrophy. Left ventricular diastolic function could not be evaluated.  Right Ventricle: The right ventricular size is normal. No increase in right ventricular wall thickness. Right ventricular systolic function is normal.  Left Atrium: Left atrial appendage is clipped. Left atrial size was severely dilated. No left atrial/left atrial appendage thrombus was detected.  Right Atrium: Right atrial size was moderately dilated. Prominent Eustachian valve.  Pericardium: There is no evidence of pericardial effusion.  Mitral Valve: The mitral valve is normal in structure. Moderate mitral valve regurgitation. No evidence of mitral valve stenosis.  Tricuspid Valve: The tricuspid valve is normal in structure. Tricuspid valve  regurgitation is moderate . No evidence of tricuspid stenosis.  Aortic Valve: The aortic valve has been repaired/replaced. Aortic valve regurgitation is not visualized. No aortic stenosis is present. Aortic valve mean gradient measures 8.0 mmHg. Aortic valve peak gradient measures 16.3 mmHg. There is a 23 mm Magna Ease pericardial valve valve present in the aortic position. Procedure Date: 2015. Echo findings are consistent with normal structure and function of the aortic valve prosthesis.  Pulmonic Valve: The pulmonic valve was normal in structure. Pulmonic valve regurgitation is not visualized. No evidence of pulmonic stenosis.  Aorta: The aortic root is normal in size and structure.  Venous: The inferior vena cava is normal in size with greater than 50% respiratory variability, suggesting right atrial pressure of 3 mmHg.  IAS/Shunts: No atrial level shunt detected by color flow Doppler. There is no evidence of an atrial septal defect.   AORTIC VALVE AV Vmax:           202.00 cm/s AV Vmean:          135.000 cm/s AV VTI:            0.379 m AV Peak Grad:      16.3 mmHg AV Mean Grad:      8.0 mmHg LVOT Vmax:         113.00 cm/s LVOT Vmean:        74.900 cm/s LVOT VTI:          0.215 m LVOT/AV VTI ratio: 0.57  TRICUSPID VALVE TR Peak grad:   24.4 mmHg TR Vmax:        247.00 cm/s  SHUNTS Systemic VTI: 0.22 m  Christoper Crafts MD Electronically signed by Christoper Crafts MD Signature Date/Time: 04/12/2020/12:34:17 PM    Final        ______________________________________________________________________________________________       Physical Exam:    VS:  BP 128/62 (BP Location: Right Arm, Cuff Size: Normal)   Pulse 61   Ht 5' 3 (1.6 m)   Wt 169 lb 3.2 oz (76.7 kg)   SpO2 95%   BMI 29.97 kg/m    Wt Readings from Last 3 Encounters:  08/12/23 169 lb 3.2 oz (76.7 kg)  04/24/23 165 lb (74.8 kg)  04/09/23 162 lb (73.5 kg)    Gen: no distress, elderly male  Neck:  Prominent V Wave Ears: Bilateral Frank Sign Cardiac: No Rubs or Gallops, systolic murmur, RRR +2 radial pulses Respiratory: Clear to auscultation bilaterally, normal effort, normal  respiratory rate GI: Soft, nontender, non-distended  MS: +  3 edema;  moves all extremities Integument: Skin feels warm, chronic LE skin changes noted, he has two hardend lumps on both thighs no hematoma; not a fluid collection Neuro:  At time of evaluation, alert and oriented to person/place/time/situation  Psych: Normal affect, patient feels fair   ASSESSMENT AND PLAN: .    Heart failure with preserved ejection fraction - Acute on Chronic Heart failure with preserved ejection fraction, contributing to significant fluid overload and peripheral edema. Symptoms include severe dyspnea and leg swelling. Echocardiogram shows degenerative changes and tricuspid regurgitation, but no severe valvular dysfunction. Clinical suspicion of fluid overload causing symptoms. Plan to aggressively manage fluid overload to improve symptoms and functional status. - Increase Lasix  to 80 mg PO BID - Increase potassium to 40 mEq daily - Order BMP to monitor renal function and electrolytes - Consider spironolactone  based on BMP results; my goal this summer is to get him off of digoxin  - Consult Dr. Bensimohnt for outpatient fluid removal options - Check albumin  level to assess nutritional status (CMP in f/u) - Check BNP to evaluate fluid status at f/u -   Tricuspid regurgitation Tricuspid regurgitation noted on echocardiogram, contributing to fluid overload and symptoms. Not severe but part of the overall cardiac dysfunction. - given SSS and PPM in the future tTEER would be technically challenging  Paroxysmal atrial fibrillation Paroxysmal atrial fibrillation, currently managed with metoprolol . No recent episodes reported since pacemaker insertion. Discussion about potential discontinuation of anticoagulation if cardiac CT confirms  atrial clip efficacy. Informed consent discussed regarding the potential to discontinue anticoagulation based on CT findings. - Continue metoprolol  - Reoffer cardiac CT at next visit to evaluate atrial clip and potential discontinuation of warfarin if no residual pouch  Coronary artery disease, status post CABG Coronary artery disease, status post CABG in 2015. LDL cholesterol is well-controlled under 70 mg/dL. No current angina reported, reducing concern for acute coronary syndrome. - Continue current therapy  Aortic valve replacement, status post Status post aortic valve replacement with a 23 mm Magna Ease valve. Echocardiogram shows some degenerative changes but no severe dysfunction. Not considered a good candidate for valve-in-valve procedure due to age and comorbidities (no indication for VinV at this time)  Prostate cancer, under treatment Prostate cancer under treatment with regular six-monthly injections at [facility].  - no SGLT2i due to this, age, and UTI risk  Non-Hodgkin's lymphoma Non-Hodgkin's lymphoma, currently not on treatment. No active management required at this time.  Peripheral neuropathy Peripheral neuropathy contributing to difficulty in driving due to numbness in feet. Fluid overload may exacerbate symptoms, but not expected to improve significantly with diuresis.  Follow-up - Schedule follow-up appointment in 6-8 weeks with my team  Gloriann Larger, MD FASE [facility] Cardiologist [facility]  [facility]  [address] [place], [address] [place], [place] [address] [phone]  10:15 AM

## 2023-08-12 NOTE — Patient Instructions (Signed)
 Medication Instructions:  Your physician has recommended you make the following change in your medication:  INCREASE: furosemide  (Lasix ) to 80 mg by mouth twice daily  INCREASE: Potassium Chloride  to 40 mEq daily  *If you need a refill on your cardiac medications before your next appointment, please call your pharmacy*  Lab Work: IN 2 WEEKS at any [facility]: BMP  If you have labs (blood work) drawn today and your tests are completely normal, you will receive your results only by: [employer] (if you have MyChart) OR A paper copy in the mail If you have any lab test that is abnormal or we need to change your treatment, we will call you to review the results.  Testing/Procedures: NONE  Follow-Up: At [facility], you and your health needs are our priority.  As part of our continuing mission to provide you with exceptional heart care, our providers are all part of one team.  This team includes your primary Cardiologist (physician) and Advanced Practice Providers or APPs (Physician Assistants and Nurse Practitioners) who all work together to provide you with the care you need, when you need it.  Your next appointment:   6-8 week(s)  Provider:   One of our Advanced Practice Providers (APPs): Melita Springer, PA-C  Friddie Jetty, NP Evaline Hill, NP  Theotis Flake, PA-C Lawana Pray, NP  Willis Harter, PA-C Lovette Rud, PA-C  [place], PA-C Ernest Dick, NP  Marlana Silvan, NP Marcie Sever, PA-C  Laquita Plant, PA-C    Dayna Dunn, PA-C  Scott Weaver, PA-C Palmer Bobo, NP Katlyn West, NP Callie Goodrich, PA-C  Evan Williams, PA-C Sheng Haley, PA-C  Xika Zhao, NP Kathleen Johnson, PA-C    We recommend signing up for the patient portal called MyChart.  Sign up information is provided on this After Visit Summary.  MyChart is used to connect with patients for Virtual Visits (Telemedicine).  Patients are able to view lab/test results, encounter notes, upcoming  appointments, etc.  Non-urgent messages can be sent to your provider as well.   To learn more about what you can do with MyChart, go to [email or web address].

## 2023-08-18 ENCOUNTER — Ambulatory Visit (INDEPENDENT_AMBULATORY_CARE_PROVIDER_SITE_OTHER): Payer: Medicare Other

## 2023-08-18 DIAGNOSIS — I495 Sick sinus syndrome: Secondary | ICD-10-CM

## 2023-08-18 LAB — CUP PACEART REMOTE DEVICE CHECK
Battery Remaining Longevity: 98 mo
Battery Remaining Percentage: 77 %
Battery Voltage: 3.04 V
Brady Statistic RV Percent Paced: 48 %
Date Time Interrogation Session: 20250617020028
Implantable Lead Connection Status: 753985
Implantable Lead Connection Status: 753985
Implantable Lead Implant Date: 20220321
Implantable Lead Implant Date: 20220321
Implantable Lead Location: 753859
Implantable Lead Location: 753860
Implantable Pulse Generator Implant Date: 20220321
Lead Channel Impedance Value: 480 Ohm
Lead Channel Pacing Threshold Amplitude: 0.75 V
Lead Channel Pacing Threshold Pulse Width: 0.4 ms
Lead Channel Sensing Intrinsic Amplitude: 9.2 mV
Lead Channel Setting Pacing Amplitude: 2.5 V
Lead Channel Setting Pacing Pulse Width: 0.4 ms
Lead Channel Setting Sensing Sensitivity: 2 mV
Pulse Gen Model: 2272
Pulse Gen Serial Number: 3909458

## 2023-08-19 ENCOUNTER — Telehealth (HOSPITAL_COMMUNITY): Payer: Self-pay | Admitting: Cardiology

## 2023-08-19 NOTE — Progress Notes (Signed)
   ADVANCED HEART FAILURE CLINIC NOTE  Referring Physician: Karalee Oscar, PA  Primary Care: Robert [name], [place] Primary Cardiologist: Heart Failure: Robert Baars, DO  CC: Acute on chronic HFpEF exacerbation  HPI: Robert [name] is a 89 y.o. male with CAD s/p CABG, pafib, AS s/p AVR in 2015, non-hodgkin's lymphoma, prostate cancer and heart failure with preserved EF presenting today to establish care.   Interval hx:  - Significantly hypervolemic today on exam with 2-3+ pitting edema to the knees. He is very adament that he does not want to change his medications and concerned about medication side effects. He is currently taking lasix  80mg  BID with minimal improvement in diuresis.   PHYSICAL EXAM: Vitals:   08/20/23 1044  BP: (!) 118/54  Pulse: 60  SpO2: 93%   Lungs- crackles at bases CARDIAC:  JVP: 12-14 cm          Normal rate with regular rhythm. No murmur.  Pulses 2+. 3+ pitting edema.  ABDOMEN: soft EXTREMITIES: Warm and well perfused.   DATA REVIEW  ECG: 04/27/23: atrial fibrillation as per my personal interpretation  ECHO: 05/29/23: LVEF 60-65%, mildly reduced RV function  ASSESSMENT & PLAN:  Heart Failure with preserved fraction - Robert [name] was sent today as an outpatient consult for alternative methods of diuresis / volume removal. He presents with severe volume overload / 3+ pitting edema to the knees. I had an extensive discussion with Robert [name] and [name]. We discussed enrollment in clinical trials like the Aquapass trial (sweatrobe), Furoscix  and/or increasing PO diuresis. Unfortunately, he is very suspicious of any changes to his medication regimen. This sounds like [place] magic. After a lengthy discussion, he agreed to try Furoscix . He does not wish to enroll in any trials.  - Currently taking lasix  80mg  BID - Hold lasix  PO; start furoscix  BID for the next 72H.  - BMP/BNP today - KCL 40meq daily; k 4.7.  -Follow up in 1  week with repeat labs.   I spent 60 minutes caring for this patient today including face to face time, ordering and reviewing labs, reviewing records, extensively discussing diuretic options with [name] and patient, seeing the patient, documenting in the record, and arranging follow ups.  Robert [name] Advanced Heart Failure Mechanical Circulatory Support

## 2023-08-19 NOTE — Telephone Encounter (Signed)
 Called to confirm/remind patient of their appointment at the [facility] on 08/19/23.   Appointment:   [x] Confirmed  [] Left mess   [] No answer/No voice mail  [] VM Full/unable to leave message  [] Phone not in service  Patient reminded to bring all medications and/or complete list.  Confirmed patient has transportation. Gave directions, instructed to utilize valet parking.

## 2023-08-20 ENCOUNTER — Ambulatory Visit: Payer: Self-pay | Admitting: Internal Medicine

## 2023-08-20 ENCOUNTER — Ambulatory Visit (HOSPITAL_COMMUNITY)
Admission: RE | Admit: 2023-08-20 | Discharge: 2023-08-20 | Disposition: A | Discharge status: Home / Self Care | Source: Ambulatory Visit | Attending: Cardiology | Admitting: Cardiology

## 2023-08-20 ENCOUNTER — Other Ambulatory Visit (HOSPITAL_COMMUNITY): Payer: Self-pay

## 2023-08-20 ENCOUNTER — Encounter (HOSPITAL_COMMUNITY): Payer: Self-pay | Admitting: Cardiology

## 2023-08-20 VITALS — BP 118/54 | HR 60 | Ht 63.0 in | Wt 162.4 lb

## 2023-08-20 DIAGNOSIS — I480 Paroxysmal atrial fibrillation: Secondary | ICD-10-CM | POA: Insufficient documentation

## 2023-08-20 DIAGNOSIS — Z951 Presence of aortocoronary bypass graft: Secondary | ICD-10-CM | POA: Insufficient documentation

## 2023-08-20 DIAGNOSIS — I5032 Chronic diastolic (congestive) heart failure: Secondary | ICD-10-CM | POA: Diagnosis not present

## 2023-08-20 DIAGNOSIS — E8770 Fluid overload, unspecified: Secondary | ICD-10-CM | POA: Diagnosis not present

## 2023-08-20 DIAGNOSIS — I2510 Atherosclerotic heart disease of native coronary artery without angina pectoris: Secondary | ICD-10-CM | POA: Diagnosis not present

## 2023-08-20 DIAGNOSIS — C61 Malignant neoplasm of prostate: Secondary | ICD-10-CM | POA: Insufficient documentation

## 2023-08-20 DIAGNOSIS — R609 Edema, unspecified: Secondary | ICD-10-CM | POA: Insufficient documentation

## 2023-08-20 DIAGNOSIS — Z79899 Other long term (current) drug therapy: Secondary | ICD-10-CM | POA: Insufficient documentation

## 2023-08-20 DIAGNOSIS — C8590 Non-Hodgkin lymphoma, unspecified, unspecified site: Secondary | ICD-10-CM | POA: Diagnosis not present

## 2023-08-20 LAB — BASIC METABOLIC PANEL WITH GFR
Anion gap: 7 (ref 5–15)
BUN: 37 mg/dL — ABNORMAL HIGH (ref 8–23)
CO2: 29 mmol/L (ref 22–32)
Calcium: 9.5 mg/dL (ref 8.9–10.3)
Chloride: 101 mmol/L (ref 98–111)
Creatinine, Ser: 1.25 mg/dL — ABNORMAL HIGH (ref 0.61–1.24)
GFR, Estimated: 54 mL/min — ABNORMAL LOW (ref >60)
Glucose, Bld: 99 mg/dL (ref 70–99)
Potassium: 4.7 mmol/L (ref 3.5–5.1)
Sodium: 137 mmol/L (ref 135–145)

## 2023-08-20 LAB — BRAIN NATRIURETIC PEPTIDE: B Natriuretic Peptide: 781 pg/mL — ABNORMAL HIGH (ref 0.0–100.0)

## 2023-08-20 MED ORDER — FUROSCIX 80 MG/10ML ~~LOC~~ CTKT: 1.0000 | CARTRIDGE | SUBCUTANEOUS | Status: DC

## 2023-08-20 MED ORDER — METOLAZONE 2.5 MG PO TABS: 2.5000 mg | ORAL_TABLET | Freq: Every day | ORAL | 0 refills | Status: DC

## 2023-08-20 NOTE — Progress Notes (Deleted)
 Medication Samples have been provided to the patient.  Drug name: FUROSCIX        Strength: 80MG         Qty: 2   LOT: [id number]  Exp.Date: 07/31/2024  Dosing instructions: as directed   The patient has been instructed regarding the correct time, dose, and frequency of taking this medication, including desired effects and most common side effects.   Raynie Steinhaus B Tausha Milhoan 11:41 AM 08/20/2023

## 2023-08-20 NOTE — Patient Instructions (Addendum)
 Medication Changes:  We recommend that you take Metolazone 2.5mg  once a day for the next 3 days.   CONTINUE CURRENT DOSE OF FUROSEMIDE    Lab Work:  Labs done today, your results will be available in MyChart, we will contact you for abnormal readings.  Follow-Up in: NEXT WEEK AT [address]- AS SCHEDULED   At the [facility], you and your health needs are our priority. We have a designated team specialized in the treatment of Heart Failure. This Care Team includes your primary Heart Failure Specialized Cardiologist (physician), Advanced Practice Providers (APPs- Physician Assistants and Nurse Practitioners), and Pharmacist who all work together to provide you with the care you need, when you need it.   You may see any of the following providers on your designated Care Team at your next follow up:  Dr. Jules Oar Dr. Peder Bourdon Dr. Alwin Baars Dr. Judyth Nunnery Nieves Bars, NP Ruddy Corral, [place] [facility] [place], [place] Dennise Fitz, NP [place] Lee, NP Luster Salters, PharmD   Please be sure to bring in all your medications bottles to every appointment.   Need to Contact Us :  If you have any questions or concerns before your next appointment please send us  a message through [place] or call our office at [phone].    TO LEAVE A MESSAGE FOR THE NURSE SELECT OPTION 2, PLEASE LEAVE A MESSAGE INCLUDING: YOUR NAME DATE OF BIRTH CALL BACK NUMBER REASON FOR CALL**this is important as we prioritize the call backs  YOU WILL RECEIVE A CALL BACK THE SAME DAY AS LONG AS YOU CALL BEFORE 4:00 PM

## 2023-08-20 NOTE — Progress Notes (Deleted)
 Provided patient education on Furoscix using demo kits and Furoscix video, QR code provided on AVS for further viewing. Furoscix order submitted online, ov note and ins card uploaded to [employer].

## 2023-08-27 NOTE — Progress Notes (Signed)
 Cardiology Office Note:    Date:  08/28/2023   ID:  Robert [name], DOB May 14, 1927, MRN [id number]  PCP:  [name], PA   [facility] Providers Cardiologist:  Stanly DELENA Leavens, MD Electrophysiologist:  Danelle Birmingham, MD     Referring MD: [name], [place]   Chief Complaint  Patient presents with   Follow-up    CHF    History of Present Illness:    Robert [name] is a 89 y.o. male with a hx of CAD s/p CABG, x 1 aortic stenosis s/p AVR 2015, permanent atrial fibrillation on Coumadin , history of prostate cancer, lymphocytic lymphoma, hyperlipidemia, moderate pulmonary hypertension, severe BAE, mild to moderate TR  He has a prior history of BMS-OM in 2011 with subsequent CABG x 1 with SVG-RV marginal in addition to AVR, maze, LAA clipping in 2015.  He is on Coumadin  for permanent atrial fibrillation due to cost.  He has a history of tachybradycardia syndrome and is s/p PPM in 2022 followed by EP.  He has a known RBBB.  Last echocardiogram 05/2023 showed LVEF preserved at 60 to 65%, no RWMA, elevated RV pressure, severe BAE, mild to moderate MR, and good AVR function.  He was recently referred to advanced heart failure clinic to review options for diuresis.  They discussed aquapass trial, furoscix , and/or increasing p.o. diuresis.  Unfortunately he is very suspicious of any changes to his medication regimen.  He agreed to try furoscix .   On 08/20/23, he was given furoscix  BID x 72 hrs while holding 80 mg lasix  BID with repeat labs in 1 week. Unfortunately insurance would not cover and he was treated with 80 mg lasix  BID and metolazone  x 3 days.   He presents today for general cardiology follow-up.  He is accompanied by daughter and his wife of 72 years.  She states that his swelling has greatly improved after 3 days of metolazone .  Still with persistent swelling on his legs, JVD has improved, lungs sound clear on exam.  Discussed different  conservative options.    Past Medical History:  Diagnosis Date   Aortic stenosis    a. s/p tissue AVR 05/2013;  b. Echo (06/2013):  Mod LVH, EF 60-65%, no RWMA, Gr 2 DD, AVR ok (mean 13 mmHg), MAC, mild MR, mod LAE, mild RAE, PASP 46 mmHg (mild pulmo HTN)   Blood loss anemia    a. 05/2009 a/w GIB.   CAD (coronary artery disease)    a. Cath 04/2009: BMS to LCx 04/2009; CTO of RCA with L-R collaterals, 40% ostial diag.   Carpal tunnel syndrome    bilateral, carpal tunnel release x 2   Esophageal ulcer    a. 05/2009 with hemorrhage - injected with epinephrine -Dr. Lamar Bunk.   Esophagitis    a. 05/2009 a/w GIB.   GERD (gastroesophageal reflux disease)    EGD, Dr. Quay 2008   GI bleed    a. 05/2009 as above: with associated anemia. Lower esophageal ulcer with extensive erosive esophagitis and large clot by EGD 05/2009.    Hx of colonoscopy 2008   Dr. Cindy, polyps- 5 yr f/u recommended   Hyperlipidemia    Hypertension    PAF (paroxysmal atrial fibrillation) (HCC)    a. Post-op AVR;  b. 04/2014 recurrent.   Perforated ear drum    right ear drum, tube in left ear drum, Dr. Jesus   Polymyalgia rheumatica [facility])    Prostate cancer [facility])    Vitamin D  deficiency     Past Surgical History:  Procedure Laterality Date   AORTIC VALVE REPLACEMENT N/A 05/12/2013   Procedure: AORTIC VALVE REPLACEMENT (AVR);  Surgeon: Maude Fleeta Ochoa, MD;  Location: [facility] OR;  Service: Open Heart Surgery;  Laterality: N/A;   CARDIAC CATHETERIZATION  2011   CARDIOVERSION N/A 04/12/2020   Procedure: CARDIOVERSION;  Surgeon: Maranda Leim DEL, MD;  Location: [facility] ENDOSCOPY;  Service: Cardiovascular;  Laterality: N/A;   CORONARY ARTERY BYPASS GRAFT N/A 05/12/2013   Procedure: CORONARY ARTERY BYPASS GRAFTING (CABG);  Surgeon: Maude Fleeta Ochoa, MD;  Location: [facility] OR;  Service: Open Heart Surgery;  Laterality: N/A;  CABG x 1 using left leg greater saphenous vein harvested endoscopically   INTRAOPERATIVE  TRANSESOPHAGEAL ECHOCARDIOGRAM N/A 05/12/2013   Procedure: INTRAOPERATIVE TRANSESOPHAGEAL ECHOCARDIOGRAM;  Surgeon: Maude Fleeta Ochoa, MD;  Location: [facility] OR;  Service: Open Heart Surgery;  Laterality: N/A;   LEFT AND RIGHT HEART CATHETERIZATION WITH CORONARY ANGIOGRAM N/A 05/09/2013   Procedure: LEFT AND RIGHT HEART CATHETERIZATION WITH CORONARY ANGIOGRAM;  Surgeon: Dorn JINNY Lesches, MD;  Location: [facility] CATH LAB;  Service: Cardiovascular;  Laterality: N/A;   MAZE N/A 05/12/2013   Procedure: MAZE;  Surgeon: Maude Fleeta Ochoa, MD;  Location: [facility] OR;  Service: Open Heart Surgery;  Laterality: N/A;   PACEMAKER IMPLANT N/A 05/21/2020   Procedure: PACEMAKER IMPLANT;  Surgeon: Waddell Danelle ORN, MD;  Location: [facility] INVASIVE CV LAB;  Service: Cardiovascular;  Laterality: N/A;   PROSTATECTOMY     TEE WITHOUT CARDIOVERSION N/A 04/12/2020   Procedure: TRANSESOPHAGEAL ECHOCARDIOGRAM (TEE);  Surgeon: Maranda Leim DEL, MD;  Location: [facility] ENDOSCOPY;  Service: Cardiovascular;  Laterality: N/A;    Current Medications: Current Meds  Medication Sig   Ascorbic Acid (VITAMIN C PO) Take 1 tablet by mouth daily.   Cholecalciferol  (DIALYVITE VITAMIN D 5000) 125 MCG (5000 UT) capsule Take 5,000 Units by mouth 2 (two) times a week.   digoxin  (LANOXIN ) 0.125 MG tablet Take 1 tablet (0.125 mg total) by mouth every evening.   furosemide  (LASIX ) 80 MG tablet Take 1 tablet (80 mg total) by mouth daily.   KLOR-CON  M20 20 MEQ tablet Take 2 tablets (40 mEq total) by mouth daily.   losartan  (COZAAR ) 25 MG tablet Take 1 tablet (25 mg total) by mouth daily.   metoprolol  succinate (TOPROL -XL) 50 MG 24 hr tablet TAKE 1 TABLET IN THE MORNING AND AT BEDTIME   pantoprazole  (PROTONIX ) 40 MG tablet Take 40 mg by mouth daily.   rosuvastatin  (CRESTOR ) 20 MG tablet TAKE 1 TABLET DAILY   warfarin (COUMADIN ) 5 MG tablet TAKE 1 TABLET DAILY AS DIRECTED BY THE COUMADIN  CLINIC.   [DISCONTINUED] losartan  (COZAAR ) 50 MG tablet TAKE 1 TABLET DAILY      Allergies:   Amoxicillin, Lisinopril, and Kenalog  [triamcinolone ]   Social History   Socioeconomic History   Marital status: Married    Spouse name: Not on file   Number of children: Not on file   Years of education: Not on file   Highest education level: Not on file  Occupational History   Not on file  Tobacco Use   Smoking status: Never   Smokeless tobacco: Never  Vaping Use   Vaping status: Never Used  Substance and Sexual Activity   Alcohol use: No   Drug use: No   Sexual activity: Not on file  Other Topics Concern   Not on file  Social History Narrative   Not on file   Social Drivers of Health  Financial Resource Strain: Not on file  Food Insecurity: No Food Insecurity (04/25/2023)   Hunger Vital Sign    Worried About Running Out of Food in the Last Year: Never true    Ran Out of Food in the Last Year: Never true  Transportation Needs: No Transportation Needs (04/25/2023)   PRAPARE - Administrator, Civil Service (Medical): No    Lack of Transportation (Non-Medical): No  Physical Activity: Not on file  Stress: Not on file  Social Connections: Moderately Isolated (04/25/2023)   Social Connection and Isolation Panel    Frequency of Communication with Friends and Family: More than three times a week    Frequency of Social Gatherings with Friends and Family: More than three times a week    Attends Religious Services: Never    Database administrator or Organizations: No    Attends Engineer, structural: Never    Marital Status: Married     Family History: The patient's family history includes Breast cancer in his daughter; CVA in his mother; Kidney disease in his father.  ROS:   Please see the history of present illness.     All other systems reviewed and are negative.  EKGs/Labs/Other Studies Reviewed:    The following studies were reviewed today:       Recent Labs: 04/21/2023: TSH 2.710 04/25/2023: Magnesium  1.8 05/15/2023: ALT 13;  Hemoglobin 10.6; Platelet Count 146 08/20/2023: B Natriuretic Peptide 781.0; BUN 37; Creatinine, Ser 1.25; Potassium 4.7; Sodium 137  Recent Lipid Panel    Component Value Date/Time   CHOL 111 04/24/2020 0917   TRIG 87 04/24/2020 0917   HDL 29 (L) 04/24/2020 0917   CHOLHDL 3.8 04/24/2020 0917   LDLCALC 65 04/24/2020 0917     Risk Assessment/Calculations:                Physical Exam:    VS:  BP (!) 100/46 (BP Location: Left Arm, Patient Position: Sitting, Cuff Size: Normal)   Pulse 60   Ht 5' 3 (1.6 m)   Wt 151 lb 6.4 oz (68.7 kg)   SpO2 96%   BMI 26.82 kg/m     Wt Readings from Last 3 Encounters:  08/28/23 151 lb 6.4 oz (68.7 kg)  08/20/23 162 lb 6.4 oz (73.7 kg)  08/12/23 169 lb 3.2 oz (76.7 kg)     GEN:  Well nourished, well developed in no acute distress HEENT: Normal NECK: mid neck JVD; No carotid bruits LYMPHATICS: No lymphadenopathy CARDIAC: irregular rhythm, regular rate RESPIRATORY:  Clear to auscultation without rales, wheezing or rhonchi  ABDOMEN: Soft, non-tender, non-distended MUSCULOSKELETAL:  2+ B LE edema; No deformity  SKIN: Warm and dry NEUROLOGIC:  Alert and oriented x 3 PSYCHIATRIC:  Normal affect   ASSESSMENT:    1. Chronic heart failure with preserved ejection fraction (HFpEF) (HCC)   2. Pulmonary hypertension, unspecified (HCC)   3. MR (congenital mitral regurgitation)   4. S/P AVR   5. S/P CABG x 1   6. Primary hypertension   7. Hyperlipidemia with target LDL less than 70   8. Persistent atrial fibrillation (HCC)   9. Secondary hypercoagulable state (HCC)    PLAN:    In order of problems listed above:  Acute on chronic diastolic heart failure Pulmonary hypertension Mild to moderate MR -Recently recommended for furoscix  by AHF but insurance would not pay, so he was given metolazone  x 3 days which greatly improved swelling - still taking 80 mg  Lasix  twice daily -  I think we should continue this along with 40 mEq potassium  daily and take metolazone  once weekly.  - weight today here 151 lbs - daughter states has lost 13 lbs last week - will need BMP today   Aortic stenosis s/p AVR at the time of sternotomy in 2015 - Last echocardiogram 05/2023 with good valve function   CAD BMS-OM 2011 CABG x 1 in 2015 -No aspirin  given Coumadin  -Continue beta-blocker and 20 mg Crestor    Hypertension - 50 mg losartan , 50 mg Toprol  - borderline BP today - will reduced losartan  to 25 mg    Permanent atrial fibrillation - 0.125 mg digoxin , 50 mg Toprol    Chronic anticoagulation with Coumadin  - INR followed by our Coumadin  clinic -Bioprosthetic AVR, Coumadin  due to cost   Follow-up in 1 month.  BMP today to decide on potassium supplement.          Medication Adjustments/Labs and Tests Ordered: Current medicines are reviewed at length with the patient today.  Concerns regarding medicines are outlined above.  Orders Placed This Encounter  Procedures   Basic metabolic panel with GFR   Meds ordered this encounter  Medications   metolazone  (ZAROXOLYN ) 2.5 MG tablet    Sig: Take 1 tablet (2.5 mg total) by mouth daily. TAKE ONCE DAILY FOR 3 DAYS    Dispense:  3 tablet    Refill:  0   losartan  (COZAAR ) 25 MG tablet    Sig: Take 1 tablet (25 mg total) by mouth daily.    Dispense:  90 tablet    Refill:  3    Patient Instructions  Medication Instructions:  Take Metolazone  ONE TABLET WEEKLY. This medication will [facility] to [facility].  DECREASE Losartan  25mg  daily. This medication will be through [facility].   *If you need a refill on your cardiac medications before your next appointment, please call your pharmacy*   Lab Work: Labs will be drawn today................... BMET If you have labs (blood work) drawn today and your tests are completely normal, you will receive your results only by: MyChart Message (if you have MyChart) OR A paper copy in the mail If you have any lab test that is abnormal or  we need to change your treatment, we will call you to review the results.   Testing/Procedures: No procedures were ordered during today's visit.    Follow-Up: At [facility], you and your health needs are our priority.  As part of our continuing mission to provide you with exceptional heart care, we have created designated Provider Care Teams.  These Care Teams include your primary Cardiologist (physician) and Advanced Practice Providers (APPs -  Physician Assistants and Nurse Practitioners) who all work together to provide you with the care you need, when you need it.  We recommend signing up for the patient portal called MyChart.  Sign up information is provided on this After Visit Summary.  MyChart is used to connect with patients for Virtual Visits (Telemedicine).  Patients are able to view lab/test results, encounter notes, upcoming appointments, etc.  Non-urgent messages can be sent to your provider as well.     To learn more about what you can do with MyChart, go to [email or web address].      Other Instructions Thank you for choosing [facility]!       Signed, Jon Nat Hails, [place]  08/28/2023 5:06 PM    [facility]

## 2023-08-28 ENCOUNTER — Encounter: Payer: Self-pay | Admitting: Physician Assistant

## 2023-08-28 ENCOUNTER — Ambulatory Visit: Attending: Physician Assistant | Admitting: Physician Assistant

## 2023-08-28 VITALS — BP 100/46 | HR 60 | Ht 63.0 in | Wt 151.4 lb

## 2023-08-28 DIAGNOSIS — Z952 Presence of prosthetic heart valve: Secondary | ICD-10-CM | POA: Diagnosis not present

## 2023-08-28 DIAGNOSIS — Q233 Congenital mitral insufficiency: Secondary | ICD-10-CM | POA: Diagnosis not present

## 2023-08-28 DIAGNOSIS — E785 Hyperlipidemia, unspecified: Secondary | ICD-10-CM | POA: Insufficient documentation

## 2023-08-28 DIAGNOSIS — I10 Essential (primary) hypertension: Secondary | ICD-10-CM | POA: Diagnosis not present

## 2023-08-28 DIAGNOSIS — I5032 Chronic diastolic (congestive) heart failure: Secondary | ICD-10-CM | POA: Insufficient documentation

## 2023-08-28 DIAGNOSIS — I272 Pulmonary hypertension, unspecified: Secondary | ICD-10-CM | POA: Diagnosis not present

## 2023-08-28 DIAGNOSIS — Z951 Presence of aortocoronary bypass graft: Secondary | ICD-10-CM | POA: Insufficient documentation

## 2023-08-28 DIAGNOSIS — I4819 Other persistent atrial fibrillation: Secondary | ICD-10-CM | POA: Insufficient documentation

## 2023-08-28 DIAGNOSIS — D6869 Other thrombophilia: Secondary | ICD-10-CM | POA: Diagnosis not present

## 2023-08-28 MED ORDER — LOSARTAN POTASSIUM 25 MG PO TABS: 25.0000 mg | ORAL_TABLET | Freq: Every day | ORAL | 3 refills | Status: DC

## 2023-08-28 MED ORDER — METOLAZONE 2.5 MG PO TABS: 2.5000 mg | ORAL_TABLET | Freq: Every day | ORAL | 0 refills | Status: DC

## 2023-08-28 NOTE — Patient Instructions (Addendum)
 Medication Instructions:  Take Metolazone  ONE TABLET WEEKLY. This [facility].  DECREASE Losartan  25mg  daily. This medication will be through [facility].   *If you need a refill on your cardiac medications before your next appointment, please call your pharmacy*   Lab Work: Labs will be drawn today................... BMET If you have labs (blood work) drawn today and your tests are completely normal, you will receive your results only by: MyChart Message (if you have MyChart) OR A paper copy in the mail If you have any lab test that is abnormal or we need to change your treatment, we will call you to review the results.   Testing/Procedures: No procedures were ordered during today's visit.    Follow-Up: At [facility], you and your health needs are our priority.  As part of our continuing mission to provide you with exceptional heart care, we have created designated Provider Care Teams.  These Care Teams include your primary Cardiologist (physician) and Advanced Practice Providers (APPs -  Physician Assistants and Nurse Practitioners) who all work together to provide you with the care you need, when you need it.  We recommend signing up for the patient portal called MyChart.  Sign up information is provided on this After Visit Summary.  MyChart is used to connect with patients for Virtual Visits (Telemedicine).  Patients are able to view lab/test results, encounter notes, upcoming appointments, etc.  Non-urgent messages can be sent to your provider as well.     To learn more about what you can do with MyChart, go to [email or web address].      Other Instructions Thank you for choosing [facility]!

## 2023-08-29 LAB — BASIC METABOLIC PANEL WITH GFR
BUN/Creatinine Ratio: 42 — ABNORMAL HIGH (ref 10–24)
BUN: 89 mg/dL (ref 10–36)
CO2: 24 mmol/L (ref 20–29)
Calcium: 9.7 mg/dL (ref 8.6–10.2)
Chloride: 97 mmol/L (ref 96–106)
Creatinine, Ser: 2.1 mg/dL — ABNORMAL HIGH (ref 0.76–1.27)
Glucose: 103 mg/dL — ABNORMAL HIGH (ref 70–99)
Potassium: 5.2 mmol/L (ref 3.5–5.2)
Sodium: 138 mmol/L (ref 134–144)
eGFR: 29 mL/min/{1.73_m2} — ABNORMAL LOW (ref >59)

## 2023-08-31 ENCOUNTER — Telehealth: Payer: Self-pay | Admitting: Physician Assistant

## 2023-08-31 DIAGNOSIS — Z79899 Other long term (current) drug therapy: Secondary | ICD-10-CM

## 2023-08-31 DIAGNOSIS — I5032 Chronic diastolic (congestive) heart failure: Secondary | ICD-10-CM

## 2023-08-31 MED ORDER — METOLAZONE 2.5 MG PO TABS: 2.5000 mg | ORAL_TABLET | ORAL | 0 refills | Status: DC

## 2023-08-31 NOTE — Telephone Encounter (Signed)
 Spoke with pt's daughter who states Metolazone  was sent in incorrectly as PA changed Metolazone  to weekly.  Rx updated per PA-C's OV note.  Pt's daughter will need to have PA review recent labs for further direction regarding fluid medications. Pt's daughter verbalizes understanding and agrees with current plan.

## 2023-08-31 NOTE — Telephone Encounter (Signed)
 Pt c/o medication issue:  1. Name of Medication: metolazone  (ZAROXOLYN ) 2.5 MG tablet   2. How are you currently taking this medication (dosage and times per day)? ??  3. Are you having a reaction (difficulty breathing--STAT)? no  4. What is your medication issue? Daughter said dosing is incorrect

## 2023-09-01 ENCOUNTER — Ambulatory Visit: Payer: Self-pay | Admitting: Physician Assistant

## 2023-09-01 NOTE — Telephone Encounter (Signed)
Lmom to discuss lab results with pt. Waiting on a return call.

## 2023-09-03 NOTE — Telephone Encounter (Signed)
Calling back for update. Please advise  

## 2023-09-03 NOTE — Telephone Encounter (Signed)
 Returned call to dtr.  She says that Dr. Santo referred pt to [facility].   Sabharwal gave him 3 options and only one was viable for him, and he said do not come back here, go back to your regular cardiologist.  She further explains this is how he got in with Jon Hails and just trying to understand the back and forth.  Why does he need to see [facility] again when they told pt to see/discuss with general cardiology ? To clarify previous message -- asking why pt needs to see [facility], not afib clinic. She states pt is losing a lot of weight and questioning if he should remain on the Lasix /dosing. Informed dtr that Jon Hails is on vacation this week and why she has not responded, aware it will be next week before she reviews/advises.  Advised to call the office after hours/over weekend if needed.  Dtr is agreeable to plan.

## 2023-09-08 ENCOUNTER — Ambulatory Visit: Attending: Physician Assistant

## 2023-09-08 DIAGNOSIS — I480 Paroxysmal atrial fibrillation: Secondary | ICD-10-CM | POA: Insufficient documentation

## 2023-09-08 DIAGNOSIS — Z5181 Encounter for therapeutic drug level monitoring: Secondary | ICD-10-CM | POA: Insufficient documentation

## 2023-09-08 LAB — POCT INR: INR: 2.8 (ref 2.0–3.0)

## 2023-09-08 NOTE — Patient Instructions (Signed)
 Description   Continue taking warfarin 1 tablet daily.  Recheck INR in 4 weeks.  Stay consistent with Ensures (3 times per week)  Call if scheduled for any procedures or has any new medications  [phone]

## 2023-09-08 NOTE — Progress Notes (Signed)
Please see anticoagulation encounter.

## 2023-09-08 NOTE — Telephone Encounter (Signed)
 Spoke with pts daughter [name].  She was given the information provided by Jon Hails PA. [name] expressed that she isn't trying to be difficult and needs additional information. Pts father has previously went to the the AHF clinic and was sent back to cardiology. Please call pts daughter at your convenience at [phone]

## 2023-09-10 NOTE — Telephone Encounter (Signed)
 Spoke with patient's daughter [name] and informed her Robert [name] is out of the office the rest of this week and will not be back until next week.  [name] expressed frustration stating she has been trying for 2 weeks to speak with Robert and has not heard anything.  Informed [name] message had been reviewed and forwarded to [facility] Clinic, shared recommendation to decrease Lasix  to 80 mg daily with repeat BMP.  [name] states patient will start decreased Lasix  dose of 80 mg daily tomorrow (09/11/23), but cannot get to lab to have labs drawn until Monday 09/14/23.  Patient has appt with Robert on 09/25/23.  [name] expressed appreciation for follow-up.

## 2023-09-10 NOTE — Telephone Encounter (Signed)
 Requesting that Robert [name] call her

## 2023-09-10 NOTE — Telephone Encounter (Signed)
 Pt's daughter [name] calling again due to not hearing back from us  yet, requesting cb

## 2023-09-11 NOTE — Telephone Encounter (Signed)
 Noted

## 2023-09-11 NOTE — Telephone Encounter (Signed)
 I called the mobile phone listed in the chart. I was not able to speak with Mr. Milewski or his daughter [name]. I left a VM and also left a VM at the number listed in the phone notes: [phone].    Jon Garre Batsheva Stevick, PA-C 09/11/2023, 10:10 AM [phone] [facility]

## 2023-09-15 DIAGNOSIS — Z79899 Other long term (current) drug therapy: Secondary | ICD-10-CM | POA: Diagnosis not present

## 2023-09-15 DIAGNOSIS — I5032 Chronic diastolic (congestive) heart failure: Secondary | ICD-10-CM | POA: Diagnosis not present

## 2023-09-15 LAB — BASIC METABOLIC PANEL WITH GFR
BUN/Creatinine Ratio: 38 — ABNORMAL HIGH (ref 10–24)
BUN: 54 mg/dL — ABNORMAL HIGH (ref 10–36)
CO2: 28 mmol/L (ref 20–29)
Calcium: 9.9 mg/dL (ref 8.6–10.2)
Chloride: 92 mmol/L — ABNORMAL LOW (ref 96–106)
Creatinine, Ser: 1.43 mg/dL — ABNORMAL HIGH (ref 0.76–1.27)
Glucose: 107 mg/dL — ABNORMAL HIGH (ref 70–99)
Potassium: 3.7 mmol/L (ref 3.5–5.2)
Sodium: 139 mmol/L (ref 134–144)
eGFR: 46 mL/min/1.73 — ABNORMAL LOW (ref >59)

## 2023-09-17 ENCOUNTER — Telehealth: Payer: Self-pay | Admitting: Internal Medicine

## 2023-09-17 DIAGNOSIS — R609 Edema, unspecified: Secondary | ICD-10-CM

## 2023-09-17 DIAGNOSIS — Z79899 Other long term (current) drug therapy: Secondary | ICD-10-CM

## 2023-09-17 NOTE — Telephone Encounter (Signed)
 Spoke to pt's daughter, she states pt's lasix  was reduced from 160mg  to 80 and the metolazone  was stopped due to kidney function. Pt has gained 5lbs since last week, she is concerned with bilateral foot/ankle swelling returning. When asking about sob, she states no more than usual. Pt has recent BMP resulted that she would like feedback on as well for kidney function.   Please advise.

## 2023-09-17 NOTE — Telephone Encounter (Signed)
 Pt c/o medication issue:  1. Name of Medication: furosemide  (LASIX ) 80 MG tablet   2. How are you currently taking this medication (dosage and times per day)? As written   3. Are you having a reaction (difficulty breathing--STAT)? No   4. What is your medication issue? Pts daughter states he was told to take a smaller dosage and its causing the pt to have minor leg swelling and she would like to know if the dosage could be changed.

## 2023-09-18 MED ORDER — FUROSEMIDE 80 MG PO TABS: 80.0000 mg | ORAL_TABLET | Freq: Two times a day (BID) | ORAL | Status: DC

## 2023-09-18 NOTE — Telephone Encounter (Signed)
 Found updated DPR 04/10/23- and we are able to speak with daughter [name].   Called and spoke with [facility]- apologized about the confusion with the DPR- the patient, spouse and sisters were very upset about this.  Apologized several times. She accepted my apology.   Asked what is the current dose of furosemide  he is taking= 80 mg daily.   Informed Dr Francyne that the pt is currently taking only 80 mg furosemide  daily. He instructed the Furosemide  to be increased to 80 mg Twice daily and to repeat BMP in one week. Also, if there is not improvement with swelling over the weekend/by Monday- then to call our office and may add back Metolazone  at that time.   Gave [name] the information above: Furosemide  80 mg bid, BMP in one week- call Monday if swelling is not decreased. They do not need a refill of this medication at this time, they have enough on hand. The patient has an appointment with Jon Hails, PA-C on 09/25/23.   Given ER precautions. She verbalized understanding of all information.

## 2023-09-18 NOTE — Telephone Encounter (Signed)
 Most recent dpr on file is for Robert [name] only (also discussed w/ mngt)  Left detailed message asking pt to call [place] office to give permission to speak w/ dtr or give him the advisement   Per Dr. Francyne (DOD): Kidney function tests are much better, but are not back to his usual baseline. Is he still taking the furosemide  80 mg twice dailY? If so, let's try changing that to 80 mg in AM and 40 mg in PM, resume the metolazone  2.5 mg once a week. BMET in 2 weeks. Do not check the BMET the day after metolazone  dosing, wait 2-3 days.

## 2023-09-18 NOTE — Telephone Encounter (Signed)
 Daughter is calling back and is worried she won't get an answer before  the weekend. She knows Jon and Dr Santo are not here today and just wants any doctor to look at what is going on and let her know. Please advise

## 2023-09-24 NOTE — Progress Notes (Unsigned)
 Cardiology Office Note:    Date:  09/25/2023   ID:  Robert [name], DOB May 02, 1927, MRN [id number]  PCP:  [name], PA   [facility] Providers Cardiologist:  Stanly DELENA Leavens, MD Cardiology APP:  Madie Jon Garre, PA  Electrophysiologist:  Danelle Birmingham, MD     Referring MD: [name], [place]   Chief Complaint  Patient presents with   Follow-up    CHF    History of Present Illness:    Robert [name] is a 89 y.o. male with a hx of CAD s/p CABG, x 1 aortic stenosis s/p AVR 2015, permanent atrial fibrillation on Coumadin , history of prostate cancer, lymphocytic lymphoma, hyperlipidemia, moderate pulmonary hypertension, severe BAE, mild to moderate TR.   He has a prior history of BMS-OM in 2011 with subsequent CABG x 1 with SVG-RV marginal in addition to AVR, maze, LAA clipping in 2015.  He is on Coumadin  for permanent atrial fibrillation due to cost.  He has a history of tachybradycardia syndrome and is s/p PPM in 2022 followed by EP.  He has a known RBBB.  Last echocardiogram 05/2023 showed LVEF preserved at 60 to 65%, no RWMA, elevated RV pressure, severe BAE, mild to moderate MR, and good AVR function.  He was recently referred to advanced heart failure clinic to review options for diuresis.  They discussed aquapass trial, furoscix , and/or increasing p.o. diuresis.  Unfortunately he is very suspicious of any changes to his medication regimen.  He agreed to try furoscix .   On 08/20/23, he saw advanced heart failure clinic but was unfortunately not able to get furoscix  covered by insurance.  He was instructed to take 80 mg Lasix  twice daily along with metolazone  x 3 days.  This resulted in brisk diuresis and improvement in symptoms.  I saw him for general cardiology follow-up 08/28/2023 and recommended continued 80 mg Lasix  twice daily along with once weekly metolazone .  Unfortunately follow-up BMP revealed significant reduction in kidney function  prompting me to discontinue his evening dose of Lasix .  Per the notes, it sounds like he has had a return of lower extremity swelling.  I reached out to advanced heart failure clinic who stated they would recommend continuing Lasix  as tolerated.  Fortunately BMP on 09/15/2023 showed renal function improved with serum creatinine 1.43, down from 2.10 on 08/08/2023.  He presents today for general cardiology follow-up.  80 mg of Lasix  once daily resulted in return of lower extremity swelling, no shortness of breath.   Past Medical History:  Diagnosis Date   Aortic stenosis    a. s/p tissue AVR 05/2013;  b. Echo (06/2013):  Mod LVH, EF 60-65%, no RWMA, Gr 2 DD, AVR ok (mean 13 mmHg), MAC, mild MR, mod LAE, mild RAE, PASP 46 mmHg (mild pulmo HTN)   Blood loss anemia    a. 05/2009 a/w GIB.   CAD (coronary artery disease)    a. Cath 04/2009: BMS to LCx 04/2009; CTO of RCA with L-R collaterals, 40% ostial diag.   Carpal tunnel syndrome    bilateral, carpal tunnel release x 2   Esophageal ulcer    a. 05/2009 with hemorrhage - injected with epinephrine -Dr. Lamar Bunk.   Esophagitis    a. 05/2009 a/w GIB.   GERD (gastroesophageal reflux disease)    EGD, Dr. Quay 2008   GI bleed    a. 05/2009 as above: with associated anemia. Lower esophageal ulcer with extensive erosive esophagitis and large clot by EGD  05/2009.    Hx of colonoscopy 2008   Dr. Cindy, polyps- 5 yr f/u recommended   Hyperlipidemia    Hypertension    PAF (paroxysmal atrial fibrillation) (HCC)    a. Post-op AVR;  b. 04/2014 recurrent.   Perforated ear drum    right ear drum, tube in left ear drum, Dr. Jesus   Polymyalgia rheumatica [facility])    Prostate cancer [facility])    Vitamin D deficiency     Past Surgical History:  Procedure Laterality Date   AORTIC VALVE REPLACEMENT N/A 05/12/2013   Procedure: AORTIC VALVE REPLACEMENT (AVR);  Surgeon: Maude Fleeta Ochoa, MD;  Location: [facility] OR;  Service: Open Heart Surgery;   Laterality: N/A;   CARDIAC CATHETERIZATION  2011   CARDIOVERSION N/A 04/12/2020   Procedure: CARDIOVERSION;  Surgeon: Maranda Leim DEL, MD;  Location: [facility] ENDOSCOPY;  Service: Cardiovascular;  Laterality: N/A;   CORONARY ARTERY BYPASS GRAFT N/A 05/12/2013   Procedure: CORONARY ARTERY BYPASS GRAFTING (CABG);  Surgeon: Maude Fleeta Ochoa, MD;  Location: [facility] OR;  Service: Open Heart Surgery;  Laterality: N/A;  CABG x 1 using left leg greater saphenous vein harvested endoscopically   INTRAOPERATIVE TRANSESOPHAGEAL ECHOCARDIOGRAM N/A 05/12/2013   Procedure: INTRAOPERATIVE TRANSESOPHAGEAL ECHOCARDIOGRAM;  Surgeon: Maude Fleeta Ochoa, MD;  Location: [facility] OR;  Service: Open Heart Surgery;  Laterality: N/A;   LEFT AND RIGHT HEART CATHETERIZATION WITH CORONARY ANGIOGRAM N/A 05/09/2013   Procedure: LEFT AND RIGHT HEART CATHETERIZATION WITH CORONARY ANGIOGRAM;  Surgeon: Dorn JINNY Lesches, MD;  Location: [facility] CATH LAB;  Service: Cardiovascular;  Laterality: N/A;   MAZE N/A 05/12/2013   Procedure: MAZE;  Surgeon: Maude Fleeta Ochoa, MD;  Location: [facility] OR;  Service: Open Heart Surgery;  Laterality: N/A;   PACEMAKER IMPLANT N/A 05/21/2020   Procedure: PACEMAKER IMPLANT;  Surgeon: Waddell Danelle ORN, MD;  Location: [facility] INVASIVE CV LAB;  Service: Cardiovascular;  Laterality: N/A;   PROSTATECTOMY     TEE WITHOUT CARDIOVERSION N/A 04/12/2020   Procedure: TRANSESOPHAGEAL ECHOCARDIOGRAM (TEE);  Surgeon: Maranda Leim DEL, MD;  Location: [facility] ENDOSCOPY;  Service: Cardiovascular;  Laterality: N/A;    Current Medications: Current Meds  Medication Sig   acetaminophen  (TYLENOL ) 325 MG tablet Take 650 mg by mouth every 6 (six) hours as needed for moderate pain.   Ascorbic Acid (VITAMIN C PO) Take 1 tablet by mouth daily.   Cholecalciferol  (DIALYVITE VITAMIN D 5000) 125 MCG (5000 UT) capsule Take 5,000 Units by mouth 2 (two) times a week.   digoxin  (LANOXIN ) 0.125 MG tablet Take 1 tablet (0.125 mg total) by mouth every evening.   furosemide   (LASIX ) 80 MG tablet Take 1 tablet (80 mg total) by mouth 2 (two) times daily.   KLOR-CON  M20 20 MEQ tablet Take 2 tablets (40 mEq total) by mouth daily.   metolazone  (ZAROXOLYN ) 2.5 MG tablet Take 1 tablet (2.5 mg total) by mouth once a week.   pantoprazole  (PROTONIX ) 40 MG tablet Take 40 mg by mouth daily.   warfarin (COUMADIN ) 5 MG tablet TAKE 1 TABLET DAILY AS DIRECTED BY THE COUMADIN  CLINIC.   [DISCONTINUED] losartan  (COZAAR ) 25 MG tablet Take 1 tablet (25 mg total) by mouth daily.   [DISCONTINUED] metoprolol  succinate (TOPROL -XL) 50 MG 24 hr tablet TAKE 1 TABLET IN THE MORNING AND AT BEDTIME   [DISCONTINUED] rosuvastatin  (CRESTOR ) 20 MG tablet TAKE 1 TABLET DAILY     Allergies:   Amoxicillin, Lisinopril, and Kenalog  [triamcinolone ]   Social History   Socioeconomic History   Marital status: Married  Spouse name: Not on file   Number of children: Not on file   Years of education: Not on file   Highest education level: Not on file  Occupational History   Not on file  Tobacco Use   Smoking status: Never   Smokeless tobacco: Never  Vaping Use   Vaping status: Never Used  Substance and Sexual Activity   Alcohol use: No   Drug use: No   Sexual activity: Not on file  Other Topics Concern   Not on file  Social History Narrative   Not on file   Social Drivers of Health   Financial Resource Strain: Not on file  Food Insecurity: No Food Insecurity (04/25/2023)   Hunger Vital Sign    Worried About Running Out of Food in the Last Year: Never true    Ran Out of Food in the Last Year: Never true  Transportation Needs: No Transportation Needs (04/25/2023)   PRAPARE - Administrator, Civil Service (Medical): No    Lack of Transportation (Non-Medical): No  Physical Activity: Not on file  Stress: Not on file  Social Connections: Moderately Isolated (04/25/2023)   Social Connection and Isolation Panel    Frequency of Communication with Friends and Family: More than three  times a week    Frequency of Social Gatherings with Friends and Family: More than three times a week    Attends Religious Services: Never    Database administrator or Organizations: No    Attends Engineer, structural: Never    Marital Status: Married     Family History: The patient's family history includes Breast cancer in his daughter; CVA in his mother; Kidney disease in his father.  ROS:   Please see the history of present illness.     All other systems reviewed and are negative.  EKGs/Labs/Other Studies Reviewed:    The following studies were reviewed today:       Recent Labs: 04/21/2023: TSH 2.710 04/25/2023: Magnesium  1.8 05/15/2023: ALT 13; Hemoglobin 10.6; Platelet Count 146 08/20/2023: B Natriuretic Peptide 781.0 09/15/2023: BUN 54; Creatinine, Ser 1.43; Potassium 3.7; Sodium 139  Recent Lipid Panel    Component Value Date/Time   CHOL 111 04/24/2020 0917   TRIG 87 04/24/2020 0917   HDL 29 (L) 04/24/2020 0917   CHOLHDL 3.8 04/24/2020 0917   LDLCALC 65 04/24/2020 0917     Risk Assessment/Calculations:    CHA2DS2-VASc Score = 5   This indicates a 7.2% annual risk of stroke. The patient's score is based upon: CHF History: 1 HTN History: 1 Diabetes History: 0 Stroke History: 0 Vascular Disease History: 1 Age Score: 2 Gender Score: 0            Physical Exam:    VS:  BP (!) 86/38   Pulse 60   Ht 5' 3 (1.6 m)   Wt 158 lb (71.7 kg)   SpO2 97%   BMI 27.99 kg/m     Wt Readings from Last 3 Encounters:  09/25/23 158 lb (71.7 kg)  08/28/23 151 lb 6.4 oz (68.7 kg)  08/20/23 162 lb 6.4 oz (73.7 kg)     GEN:  elderly male in NAD HEENT: Normal NECK: minimal JVD; No carotid bruits LYMPHATICS: No lymphadenopathy CARDIAC: irregular rhythm, regular rate RESPIRATORY:  Clear to auscultation without rales, wheezing or rhonchi  ABDOMEN: Soft, non-tender, non-distended MUSCULOSKELETAL:  R > L LE edema, pitting on left, no wounds  SKIN: Warm and  dry NEUROLOGIC:  Alert and oriented x 3 PSYCHIATRIC:  Normal affect   ASSESSMENT:    1. Permanent atrial fibrillation (HCC)   2. Encounter for therapeutic drug monitoring   3. Acute on chronic diastolic congestive heart failure (HCC)   4. Pulmonary hypertension, unspecified (HCC)   5. MR (congenital mitral regurgitation)   6. S/P AVR   7. S/P CABG x 1   8. Primary hypertension   9. Hypotension, unspecified hypotension type   10. Atrial fibrillation with RVR (HCC)   11. PAF (paroxysmal atrial fibrillation) (HCC)   12. Chronic anticoagulation    PLAN:    In order of problems listed above:  Acute on chronic diastolic heart failure Pulmonary hypertension Mild to moderate MR - difficult balancing lasix /metolazone  dosing and renal function -- will return to 80 mg BID, but may ultimately land at 80 mg lasix  qAM and 40 mg qPM -- will still add back once weekly metolazone , family does a great job managing his fluid status -- his weight has stabilized at 150-151 lbs -- will draw BMP today as he has been on 80 mg lasix  BID for 1 week, no metolazone  -- I think we will need to accept a higher creatinine   Aortic stenosis s/p AVR at the time of sternotomy in 2015 -Last echocardiogram 05/2023 with good valve function   CAD BMS-OM 2011 CABG x 1 in 2015 -No aspirin  given Coumadin  -- reduce toprol  to 50 mg nightly  -- he wants to stop crestor  and I think this is fine, especially with fluctuating renal function   Hypertension --> now hypotensive - D/C 50 mg losartan  -- reduce toprpol to 50 mg once daily instead of BID   Permanent atrial fibrillation - 0.125 mg digoxin , 50 mg Toprol  nightly -- given fluctuating renal function, will check digoxin  level today - may need to reach out to Dr. Waddell for guidance -- HR today 60   Chronic anticoagulation with Coumadin  - INR followed by our Coumadin  clinic -Bioprosthetic AVR, Coumadin  due to cost -- no bleeding issues, no recent  falls   Follow up with Dr. Santo in 5-6 months.        Medication Adjustments/Labs and Tests Ordered: Current medicines are reviewed at length with the patient today.  Concerns regarding medicines are outlined above.  Orders Placed This Encounter  Procedures   Basic metabolic panel with GFR   Digoxin  level   Meds ordered this encounter  Medications   metoprolol  succinate (TOPROL -XL) 50 MG 24 hr tablet    Sig: Take 1 tablet (50 mg total) by mouth daily. Take with or immediately following a meal.    Dispense:  180 tablet    Refill:  1    Patient Instructions  Medication Instructions:  STOP Losartan .   STOP Crestor .  Take the Metoprolol  at night only. Do not take the morning dose. *If you need a refill on your cardiac medications before your next appointment, please call your pharmacy*   Lab Work: Labs will be drawn today............... BMET, DIGOXIN   If you have labs (blood work) drawn today and your tests are completely normal, you will receive your results only by: MyChart Message (if you have MyChart) OR A paper copy in the mail If you have any lab test that is abnormal or we need to change your treatment, we will call you to review the results.   Testing/Procedures: No procedures were ordered during today's visit.    Follow-Up: At [facility], you and your health needs are  our priority.  As part of our continuing mission to provide you with exceptional heart care, we have created designated Provider Care Teams.  These Care Teams include your primary Cardiologist (physician) and Advanced Practice Providers (APPs -  Physician Assistants and Nurse Practitioners) who all work together to provide you with the care you need, when you need it.  We recommend signing up for the patient portal called MyChart.  Sign up information is provided on this After Visit Summary.  MyChart is used to connect with patients for Virtual Visits (Telemedicine).  Patients  are able to view lab/test results, encounter notes, upcoming appointments, etc.  Non-urgent messages can be sent to your provider as well.   To learn more about what you can do with MyChart, go to [email or web address].    Your next appointment:   4 month(s)  Provider:   Stanly DELENA Leavens, MD    Other Instructions Thank you for choosing [facility]!       Signed, Jon Nat Hails, [place]  09/25/2023 11:06 AM    [facility]

## 2023-09-25 ENCOUNTER — Ambulatory Visit: Attending: Physician Assistant | Admitting: Physician Assistant

## 2023-09-25 ENCOUNTER — Encounter: Payer: Self-pay | Admitting: Physician Assistant

## 2023-09-25 VITALS — BP 86/38 | HR 60 | Ht 63.0 in | Wt 158.0 lb

## 2023-09-25 DIAGNOSIS — I5033 Acute on chronic diastolic (congestive) heart failure: Secondary | ICD-10-CM | POA: Insufficient documentation

## 2023-09-25 DIAGNOSIS — I272 Pulmonary hypertension, unspecified: Secondary | ICD-10-CM | POA: Diagnosis not present

## 2023-09-25 DIAGNOSIS — I959 Hypotension, unspecified: Secondary | ICD-10-CM | POA: Insufficient documentation

## 2023-09-25 DIAGNOSIS — I480 Paroxysmal atrial fibrillation: Secondary | ICD-10-CM | POA: Diagnosis not present

## 2023-09-25 DIAGNOSIS — I10 Essential (primary) hypertension: Secondary | ICD-10-CM | POA: Insufficient documentation

## 2023-09-25 DIAGNOSIS — I4821 Permanent atrial fibrillation: Secondary | ICD-10-CM | POA: Diagnosis not present

## 2023-09-25 DIAGNOSIS — Z5181 Encounter for therapeutic drug level monitoring: Secondary | ICD-10-CM | POA: Insufficient documentation

## 2023-09-25 DIAGNOSIS — Q233 Congenital mitral insufficiency: Secondary | ICD-10-CM | POA: Insufficient documentation

## 2023-09-25 DIAGNOSIS — Z952 Presence of prosthetic heart valve: Secondary | ICD-10-CM | POA: Diagnosis not present

## 2023-09-25 DIAGNOSIS — Z951 Presence of aortocoronary bypass graft: Secondary | ICD-10-CM | POA: Diagnosis not present

## 2023-09-25 DIAGNOSIS — Z7901 Long term (current) use of anticoagulants: Secondary | ICD-10-CM | POA: Diagnosis not present

## 2023-09-25 DIAGNOSIS — I4891 Unspecified atrial fibrillation: Secondary | ICD-10-CM | POA: Insufficient documentation

## 2023-09-25 MED ORDER — METOPROLOL SUCCINATE ER 50 MG PO TB24: 50.0000 mg | ORAL_TABLET | Freq: Every day | ORAL | 1 refills | Status: DC

## 2023-09-25 NOTE — Patient Instructions (Addendum)
 Medication Instructions:  STOP Losartan .   STOP Crestor .  Take the Metoprolol  at night only. Do not take the morning dose. *If you need a refill on your cardiac medications before your next appointment, please call your pharmacy*   Lab Work: Labs will be drawn today............... BMET, DIGOXIN   If you have labs (blood work) drawn today and your tests are completely normal, you will receive your results only by: MyChart Message (if you have MyChart) OR A paper copy in the mail If you have any lab test that is abnormal or we need to change your treatment, we will call you to review the results.   Testing/Procedures: No procedures were ordered during today's visit.    Follow-Up: At [facility], you and your health needs are our priority.  As part of our continuing mission to provide you with exceptional heart care, we have created designated Provider Care Teams.  These Care Teams include your primary Cardiologist (physician) and Advanced Practice Providers (APPs -  Physician Assistants and Nurse Practitioners) who all work together to provide you with the care you need, when you need it.  We recommend signing up for the patient portal called MyChart.  Sign up information is provided on this After Visit Summary.  MyChart is used to connect with patients for Virtual Visits (Telemedicine).  Patients are able to view lab/test results, encounter notes, upcoming appointments, etc.  Non-urgent messages can be sent to your provider as well.   To learn more about what you can do with MyChart, go to [email or web address].    Your next appointment:   4 month(s)  Provider:   Stanly DELENA Leavens, MD    Other Instructions Thank you for choosing [facility]!

## 2023-09-26 LAB — BASIC METABOLIC PANEL WITH GFR
BUN/Creatinine Ratio: 27 — ABNORMAL HIGH (ref 10–24)
BUN: 36 mg/dL (ref 10–36)
CO2: 27 mmol/L (ref 20–29)
Calcium: 9.3 mg/dL (ref 8.6–10.2)
Chloride: 97 mmol/L (ref 96–106)
Creatinine, Ser: 1.33 mg/dL — ABNORMAL HIGH (ref 0.76–1.27)
Glucose: 95 mg/dL (ref 70–99)
Potassium: 3.4 mmol/L — ABNORMAL LOW (ref 3.5–5.2)
Sodium: 142 mmol/L (ref 134–144)
eGFR: 50 mL/min/1.73 — ABNORMAL LOW (ref >59)

## 2023-09-26 LAB — DIGOXIN LEVEL: Digoxin, Serum: 0.7 ng/mL (ref 0.5–0.9)

## 2023-09-28 ENCOUNTER — Telehealth: Payer: Self-pay | Admitting: Physician Assistant

## 2023-09-28 ENCOUNTER — Ambulatory Visit: Admitting: Physician Assistant

## 2023-09-28 ENCOUNTER — Ambulatory Visit: Payer: Self-pay | Admitting: Physician Assistant

## 2023-09-28 NOTE — Telephone Encounter (Signed)
 Daughter returned call for results.  Please call her back at [phone].

## 2023-09-28 NOTE — Telephone Encounter (Signed)
 Call has been addressed. See lab results note for documentation.

## 2023-10-06 ENCOUNTER — Ambulatory Visit: Attending: Physician Assistant

## 2023-10-06 DIAGNOSIS — I480 Paroxysmal atrial fibrillation: Secondary | ICD-10-CM | POA: Diagnosis not present

## 2023-10-06 LAB — POCT INR: INR: 2 (ref 2.0–3.0)

## 2023-10-06 NOTE — Progress Notes (Signed)
 INR 2.0 Please see anticoagulation encounter.

## 2023-10-06 NOTE — Patient Instructions (Signed)
 Description   Take 1.5 tablets today and then continue taking warfarin 1 tablet daily.  Recheck INR in 5 weeks.  Stay consistent with Ensures (2 times per week)  Call if scheduled for any procedures or has any new medications  [phone]

## 2023-10-23 DIAGNOSIS — L03115 Cellulitis of right lower limb: Secondary | ICD-10-CM | POA: Diagnosis not present

## 2023-10-29 DIAGNOSIS — C44319 Basal cell carcinoma of skin of other parts of face: Secondary | ICD-10-CM | POA: Diagnosis not present

## 2023-10-29 DIAGNOSIS — L98499 Non-pressure chronic ulcer of skin of other sites with unspecified severity: Secondary | ICD-10-CM | POA: Diagnosis not present

## 2023-10-29 DIAGNOSIS — C44722 Squamous cell carcinoma of skin of right lower limb, including hip: Secondary | ICD-10-CM | POA: Diagnosis not present

## 2023-10-29 DIAGNOSIS — Z85828 Personal history of other malignant neoplasm of skin: Secondary | ICD-10-CM | POA: Diagnosis not present

## 2023-10-29 DIAGNOSIS — L97911 Non-pressure chronic ulcer of unspecified part of right lower leg limited to breakdown of skin: Secondary | ICD-10-CM | POA: Diagnosis not present

## 2023-10-29 DIAGNOSIS — C4442 Squamous cell carcinoma of skin of scalp and neck: Secondary | ICD-10-CM | POA: Diagnosis not present

## 2023-10-29 NOTE — Progress Notes (Signed)
 Remote pacemaker transmission.

## 2023-11-02 ENCOUNTER — Encounter (HOSPITAL_COMMUNITY): Payer: Self-pay | Admitting: Emergency Medicine

## 2023-11-02 ENCOUNTER — Other Ambulatory Visit: Payer: Self-pay

## 2023-11-02 ENCOUNTER — Emergency Department (HOSPITAL_COMMUNITY)

## 2023-11-02 ENCOUNTER — Inpatient Hospital Stay (HOSPITAL_COMMUNITY)
Admission: EM | Admit: 2023-11-02 | Discharge: 2023-11-07 | DRG: 603 | Disposition: A | Discharge status: Home Health | Source: Ambulatory Visit | Attending: Internal Medicine | Admitting: Internal Medicine

## 2023-11-02 DIAGNOSIS — Z789 Other specified health status: Secondary | ICD-10-CM

## 2023-11-02 DIAGNOSIS — Z952 Presence of prosthetic heart valve: Secondary | ICD-10-CM

## 2023-11-02 DIAGNOSIS — Z856 Personal history of leukemia: Secondary | ICD-10-CM

## 2023-11-02 DIAGNOSIS — Z66 Do not resuscitate: Secondary | ICD-10-CM | POA: Diagnosis not present

## 2023-11-02 DIAGNOSIS — I350 Nonrheumatic aortic (valve) stenosis: Secondary | ICD-10-CM

## 2023-11-02 DIAGNOSIS — T148XXA Other injury of unspecified body region, initial encounter: Secondary | ICD-10-CM | POA: Diagnosis not present

## 2023-11-02 DIAGNOSIS — I4821 Permanent atrial fibrillation: Secondary | ICD-10-CM | POA: Diagnosis present

## 2023-11-02 DIAGNOSIS — I5042 Chronic combined systolic (congestive) and diastolic (congestive) heart failure: Secondary | ICD-10-CM | POA: Diagnosis not present

## 2023-11-02 DIAGNOSIS — L089 Local infection of the skin and subcutaneous tissue, unspecified: Secondary | ICD-10-CM | POA: Diagnosis not present

## 2023-11-02 DIAGNOSIS — Z803 Family history of malignant neoplasm of breast: Secondary | ICD-10-CM

## 2023-11-02 DIAGNOSIS — M353 Polymyalgia rheumatica: Secondary | ICD-10-CM | POA: Diagnosis not present

## 2023-11-02 DIAGNOSIS — I2510 Atherosclerotic heart disease of native coronary artery without angina pectoris: Secondary | ICD-10-CM | POA: Diagnosis present

## 2023-11-02 DIAGNOSIS — I495 Sick sinus syndrome: Secondary | ICD-10-CM | POA: Diagnosis not present

## 2023-11-02 DIAGNOSIS — K219 Gastro-esophageal reflux disease without esophagitis: Secondary | ICD-10-CM | POA: Diagnosis not present

## 2023-11-02 DIAGNOSIS — Z8719 Personal history of other diseases of the digestive system: Secondary | ICD-10-CM

## 2023-11-02 DIAGNOSIS — E876 Hypokalemia: Secondary | ICD-10-CM | POA: Diagnosis present

## 2023-11-02 DIAGNOSIS — M7989 Other specified soft tissue disorders: Secondary | ICD-10-CM | POA: Diagnosis not present

## 2023-11-02 DIAGNOSIS — Z8546 Personal history of malignant neoplasm of prostate: Secondary | ICD-10-CM | POA: Diagnosis not present

## 2023-11-02 DIAGNOSIS — I739 Peripheral vascular disease, unspecified: Secondary | ICD-10-CM | POA: Diagnosis present

## 2023-11-02 DIAGNOSIS — C61 Malignant neoplasm of prostate: Secondary | ICD-10-CM | POA: Diagnosis present

## 2023-11-02 DIAGNOSIS — E785 Hyperlipidemia, unspecified: Secondary | ICD-10-CM | POA: Diagnosis present

## 2023-11-02 DIAGNOSIS — E782 Mixed hyperlipidemia: Secondary | ICD-10-CM

## 2023-11-02 DIAGNOSIS — Z8572 Personal history of non-Hodgkin lymphomas: Secondary | ICD-10-CM | POA: Diagnosis not present

## 2023-11-02 DIAGNOSIS — I480 Paroxysmal atrial fibrillation: Secondary | ICD-10-CM | POA: Diagnosis present

## 2023-11-02 DIAGNOSIS — D638 Anemia in other chronic diseases classified elsewhere: Secondary | ICD-10-CM | POA: Diagnosis present

## 2023-11-02 DIAGNOSIS — I110 Hypertensive heart disease with heart failure: Secondary | ICD-10-CM | POA: Diagnosis present

## 2023-11-02 DIAGNOSIS — Z951 Presence of aortocoronary bypass graft: Secondary | ICD-10-CM

## 2023-11-02 DIAGNOSIS — Z823 Family history of stroke: Secondary | ICD-10-CM | POA: Diagnosis not present

## 2023-11-02 DIAGNOSIS — I10 Essential (primary) hypertension: Secondary | ICD-10-CM | POA: Diagnosis present

## 2023-11-02 DIAGNOSIS — Z79899 Other long term (current) drug therapy: Secondary | ICD-10-CM | POA: Diagnosis not present

## 2023-11-02 DIAGNOSIS — Z953 Presence of xenogenic heart valve: Secondary | ICD-10-CM | POA: Diagnosis not present

## 2023-11-02 DIAGNOSIS — Z7901 Long term (current) use of anticoagulants: Secondary | ICD-10-CM | POA: Diagnosis not present

## 2023-11-02 DIAGNOSIS — K2100 Gastro-esophageal reflux disease with esophagitis, without bleeding: Secondary | ICD-10-CM | POA: Diagnosis not present

## 2023-11-02 DIAGNOSIS — L03115 Cellulitis of right lower limb: Principal | ICD-10-CM | POA: Diagnosis present

## 2023-11-02 DIAGNOSIS — Z841 Family history of disorders of kidney and ureter: Secondary | ICD-10-CM | POA: Diagnosis not present

## 2023-11-02 DIAGNOSIS — Z950 Presence of cardiac pacemaker: Secondary | ICD-10-CM | POA: Diagnosis present

## 2023-11-02 DIAGNOSIS — S81801D Unspecified open wound, right lower leg, subsequent encounter: Secondary | ICD-10-CM | POA: Diagnosis not present

## 2023-11-02 DIAGNOSIS — I081 Rheumatic disorders of both mitral and tricuspid valves: Secondary | ICD-10-CM | POA: Diagnosis present

## 2023-11-02 LAB — COMPREHENSIVE METABOLIC PANEL WITH GFR
ALT: 15 U/L (ref 0–44)
AST: 24 U/L (ref 15–41)
Albumin: 3.9 g/dL (ref 3.5–5.0)
Alkaline Phosphatase: 32 U/L — ABNORMAL LOW (ref 38–126)
Anion gap: 16 — ABNORMAL HIGH (ref 5–15)
BUN: 30 mg/dL — ABNORMAL HIGH (ref 8–23)
CO2: 27 mmol/L (ref 22–32)
Calcium: 9.4 mg/dL (ref 8.9–10.3)
Chloride: 97 mmol/L — ABNORMAL LOW (ref 98–111)
Creatinine, Ser: 1.07 mg/dL (ref 0.61–1.24)
GFR, Estimated: >60 mL/min (ref >60)
Glucose, Bld: 93 mg/dL (ref 70–99)
Potassium: 3.5 mmol/L (ref 3.5–5.1)
Sodium: 140 mmol/L (ref 135–145)
Total Bilirubin: 1.1 mg/dL (ref 0.0–1.2)
Total Protein: 6.8 g/dL (ref 6.5–8.1)

## 2023-11-02 LAB — CBC
HCT: 31.9 % — ABNORMAL LOW (ref 39.0–52.0)
Hemoglobin: 10.2 g/dL — ABNORMAL LOW (ref 13.0–17.0)
MCH: 31.7 pg (ref 26.0–34.0)
MCHC: 32 g/dL (ref 30.0–36.0)
MCV: 99.1 fL (ref 80.0–100.0)
Platelets: 217 K/uL (ref 150–400)
RBC: 3.22 MIL/uL — ABNORMAL LOW (ref 4.22–5.81)
RDW: 16.8 % — ABNORMAL HIGH (ref 11.5–15.5)
WBC: 18.5 K/uL — ABNORMAL HIGH (ref 4.0–10.5)
nRBC: 0 % (ref 0.0–0.2)

## 2023-11-02 LAB — SEDIMENTATION RATE: Sed Rate: 65 mm/h — ABNORMAL HIGH (ref 0–16)

## 2023-11-02 LAB — C-REACTIVE PROTEIN: CRP: 7.6 mg/dL — ABNORMAL HIGH (ref <1.0)

## 2023-11-02 LAB — PROTIME-INR
INR: 2.1 — ABNORMAL HIGH (ref 0.8–1.2)
Prothrombin Time: 25 s — ABNORMAL HIGH (ref 11.4–15.2)

## 2023-11-02 LAB — I-STAT CG4 LACTIC ACID, ED: Lactic Acid, Venous: 1.3 mmol/L (ref 0.5–1.9)

## 2023-11-02 MED ORDER — POLYETHYLENE GLYCOL 3350 17 G PO PACK
17.0000 g | PACK | Freq: Every day | ORAL | Status: DC | PRN
  Filled 2023-11-02: qty 1

## 2023-11-02 MED ORDER — VANCOMYCIN HCL 1500 MG/300ML IV SOLN
1500.0000 mg | Freq: Once | INTRAVENOUS | Status: AC
  Administered 2023-11-02: 1500 mg via INTRAVENOUS
  Filled 2023-11-02: qty 300

## 2023-11-02 MED ORDER — OXYCODONE-ACETAMINOPHEN 5-325 MG PO TABS
1.0000 | ORAL_TABLET | Freq: Once | ORAL | Status: AC
  Administered 2023-11-02: 1 via ORAL
  Filled 2023-11-02: qty 1

## 2023-11-02 MED ORDER — PANTOPRAZOLE SODIUM 40 MG PO TBEC
40.0000 mg | DELAYED_RELEASE_TABLET | Freq: Every day | ORAL | Status: DC
  Administered 2023-11-03 – 2023-11-07 (×5): 40 mg via ORAL
  Filled 2023-11-02 (×6): qty 1

## 2023-11-02 MED ORDER — ROSUVASTATIN CALCIUM 20 MG PO TABS
20.0000 mg | ORAL_TABLET | Freq: Every day | ORAL | Status: DC
  Administered 2023-11-02: 20 mg via ORAL
  Filled 2023-11-02 (×3): qty 1

## 2023-11-02 MED ORDER — ACETAMINOPHEN 650 MG RE SUPP: 650.0000 mg | Freq: Four times a day (QID) | RECTAL | Status: DC | PRN

## 2023-11-02 MED ORDER — SODIUM CHLORIDE 0.9 % IV SOLN
1.0000 g | Freq: Once | INTRAVENOUS | Status: AC
  Administered 2023-11-02: 1 g via INTRAVENOUS
  Filled 2023-11-02: qty 10

## 2023-11-02 MED ORDER — POTASSIUM CHLORIDE CRYS ER 20 MEQ PO TBCR
40.0000 meq | EXTENDED_RELEASE_TABLET | Freq: Every day | ORAL | Status: AC
  Administered 2023-11-02: 40 meq via ORAL
  Filled 2023-11-02: qty 2

## 2023-11-02 MED ORDER — WARFARIN SODIUM 5 MG PO TABS
5.0000 mg | ORAL_TABLET | Freq: Every day | ORAL | Status: DC
  Administered 2023-11-02: 5 mg via ORAL
  Filled 2023-11-02 (×3): qty 1

## 2023-11-02 MED ORDER — FUROSEMIDE 40 MG PO TABS
80.0000 mg | ORAL_TABLET | Freq: Two times a day (BID) | ORAL | Status: DC
  Filled 2023-11-02 (×4): qty 2

## 2023-11-02 MED ORDER — WARFARIN - PHARMACIST DOSING INPATIENT
Freq: Every day | Status: DC
  Administered 2023-11-06: 5

## 2023-11-02 MED ORDER — HYDROCODONE-ACETAMINOPHEN 5-325 MG PO TABS
1.0000 | ORAL_TABLET | Freq: Four times a day (QID) | ORAL | Status: DC | PRN
  Filled 2023-11-02: qty 1

## 2023-11-02 MED ORDER — METOPROLOL SUCCINATE ER 50 MG PO TB24
50.0000 mg | ORAL_TABLET | Freq: Every day | ORAL | Status: DC
  Administered 2023-11-03 – 2023-11-07 (×5): 50 mg via ORAL
  Filled 2023-11-02 (×6): qty 1

## 2023-11-02 MED ORDER — ACETAMINOPHEN 325 MG PO TABS
650.0000 mg | ORAL_TABLET | Freq: Four times a day (QID) | ORAL | Status: DC | PRN
  Administered 2023-11-02: 650 mg via ORAL
  Filled 2023-11-02: qty 2

## 2023-11-02 MED ORDER — VANCOMYCIN HCL 750 MG/150ML IV SOLN
750.0000 mg | INTRAVENOUS | Status: DC
  Filled 2023-11-02: qty 150

## 2023-11-02 MED ORDER — DIGOXIN 125 MCG PO TABS
0.1250 mg | ORAL_TABLET | Freq: Every evening | ORAL | Status: DC
  Administered 2023-11-02 – 2023-11-06 (×5): 0.125 mg via ORAL
  Filled 2023-11-02 (×6): qty 1

## 2023-11-02 NOTE — ED Provider Triage Note (Signed)
 Emergency Medicine Provider Triage Evaluation Note  Robert [name] , a 89 y.o. male  was evaluated in triage.  Pt complains of right lower extremity wound and redness. Patient had chemo injected there a few days ago and was sent in by dermatologist.   Review of Systems  Positive: Right leg swelling, redness, pain Negative: Fevers, chills, nausea, body aches   Physical Exam  BP (!) 154/63 (BP Location: Left Arm)   Pulse 70   Temp (!) 97.5 F (36.4 C)   Resp 16   SpO2 96%  Gen:   Awake, no distress   Resp:  Normal effort  MSK:   Right lower extremity with significant swelling when compared to left, there is some erythema up to the knee and tenderness to palpation, with wound on back of the leg which appears chronic but somewhat necrotic  Medical Decision Making  Medically screening exam initiated at 10:30 AM.  Appropriate orders placed.  Robert [name] was informed that the remainder of the evaluation will be completed by another provider, this initial triage assessment does not replace that evaluation, and the importance of remaining in the ED until their evaluation is complete.     Gennaro Bouchard L, DO 11/02/23 1031

## 2023-11-02 NOTE — ED Notes (Signed)
Pt amb to RR.

## 2023-11-02 NOTE — ED Notes (Signed)
 Patient transported to X-ray

## 2023-11-02 NOTE — ED Provider Notes (Signed)
 [place] EMERGENCY DEPARTMENT AT [facility] Provider Note   CSN: [id number] Arrival date & time: 11/02/23  1002     Patient presents with: Wound Infection   Robert [name] is a 89 y.o. male.   HPI Patient presents for wound infection.  Medical history includes HTN, HLD, CAD, atrial fibrillation, CHF, GERD, prostate cancer.  He is on warfarin.  For the past month, he has had a wound on the posterior aspect of his right calf.  He was seen in urgent care 2 weeks ago.  He was prescribed 7 days of doxycycline, which he finished last week.  On Thursday, he had appointment with a dermatologist.  Dermatologist provided gentle cleaning to the area of his wound with wet gauze.  He was advised to use antibiotic ointment.  He was noted to have a squamous cell carcinoma distal to the wound.  Because of the wound, this was not excised.  A shot of some sort of chemotherapy was injected into the squamous cell lesion.  Since then, he has had worsening pain, swelling, erythema, warmth, tenderness, and drainage from his wound.  Pain extends to area of mid calf.     Prior to Admission medications   Medication Sig Start Date End Date Taking? Authorizing Provider  acetaminophen  (TYLENOL ) 325 MG tablet Take 650 mg by mouth every 6 (six) hours as needed for moderate pain.    [provider]  Ascorbic Acid (VITAMIN C PO) Take 1 tablet by mouth daily.    [provider]  Cholecalciferol  (DIALYVITE VITAMIN D 5000) 125 MCG (5000 UT) capsule Take 5,000 Units by mouth 2 (two) times a week.    [provider]  digoxin  (LANOXIN ) 0.125 MG tablet Take 1 tablet (0.125 mg total) by mouth every evening. 04/25/23   Arlon Carliss ORN, DO  furosemide  (LASIX ) 80 MG tablet Take 1 tablet (80 mg total) by mouth 2 (two) times daily. 09/18/23   Croitoru, Mihai, MD  KLOR-CON  M20 20 MEQ tablet Take 2 tablets (40 mEq total) by mouth daily. 08/12/23   Chandrasekhar, Stanly LABOR, MD  metolazone   (ZAROXOLYN ) 2.5 MG tablet Take 1 tablet (2.5 mg total) by mouth once a week. 08/31/23   Duke, Jon Garre, PA  metoprolol  succinate (TOPROL -XL) 50 MG 24 hr tablet Take 1 tablet (50 mg total) by mouth daily. Take with or immediately following a meal. 09/25/23   Duke, Jon Garre, PA  pantoprazole  (PROTONIX ) 40 MG tablet Take 40 mg by mouth daily. 03/13/23   [provider]  warfarin (COUMADIN ) 5 MG tablet TAKE 1 TABLET DAILY AS DIRECTED BY THE COUMADIN  CLINIC. 12/30/22   Dunn, Dayna N, PA-C    Allergies: Amoxicillin, Lisinopril, and Kenalog  [triamcinolone ]    Review of Systems  Cardiovascular:  Positive for leg swelling.  Skin:  Positive for color change and wound.  All other systems reviewed and are negative.   Updated Vital Signs BP (!) 154/63 (BP Location: Left Arm)   Pulse 70   Temp (!) 97.5 F (36.4 C)   Resp 16   SpO2 96%   Physical Exam Vitals and nursing note reviewed.  Constitutional:      General: He is not in acute distress.    Appearance: Normal appearance. He is well-developed. He is not ill-appearing, toxic-appearing or diaphoretic.  HENT:     Head: Normocephalic and atraumatic.     Right Ear: External ear normal.     Left Ear: External ear normal.  Nose: Nose normal.     Mouth/Throat:     Mouth: Mucous membranes are moist.  Eyes:     Extraocular Movements: Extraocular movements intact.     Conjunctiva/sclera: Conjunctivae normal.  Cardiovascular:     Rate and Rhythm: Normal rate and regular rhythm.  Pulmonary:     Effort: Pulmonary effort is normal. No respiratory distress.  Abdominal:     General: There is no distension.     Palpations: Abdomen is soft.  Musculoskeletal:        General: Swelling and tenderness present.     Cervical back: Normal range of motion and neck supple.     Right lower leg: Edema present.  Skin:    General: Skin is warm and dry.     Findings: Erythema present.  Neurological:     General: No focal deficit present.      Mental Status: He is alert and oriented to person, place, and time.  Psychiatric:        Mood and Affect: Mood normal.        Behavior: Behavior normal.     (all labs ordered are listed, but only abnormal results are displayed) Labs Reviewed  CBC - Abnormal; Notable for the following components:      Result Value   WBC 18.5 (*)    RBC 3.22 (*)    Hemoglobin 10.2 (*)    HCT 31.9 (*)    RDW 16.8 (*)    All other components within normal limits  COMPREHENSIVE METABOLIC PANEL WITH GFR - Abnormal; Notable for the following components:   Chloride 97 (*)    BUN 30 (*)    Alkaline Phosphatase 32 (*)    Anion gap 16 (*)    All other components within normal limits  CULTURE, BLOOD (ROUTINE X 2)  CULTURE, BLOOD (ROUTINE X 2)  SEDIMENTATION RATE  C-REACTIVE PROTEIN  I-STAT CG4 LACTIC ACID, ED    EKG: None  Radiology: No results found.   Procedures   Medications Ordered in the ED  cefTRIAXone  (ROCEPHIN ) 1 g in sodium chloride  0.9 % 100 mL IVPB (1 g Intravenous New Bag/Given 11/02/23 1138)  vancomycin  (VANCOREADY) IVPB 1500 mg/300 mL (has no administration in time range)  vancomycin  (VANCOREADY) IVPB 750 mg/150 mL (has no administration in time range)  oxyCODONE -acetaminophen  (PERCOCET/ROXICET) 5-325 MG per tablet 1 tablet (1 tablet Oral Given 11/02/23 1135)                                    Medical Decision Making Amount and/or Complexity of Data Reviewed Radiology: ordered.  Risk Prescription drug management. Decision regarding hospitalization.   This patient presents to the ED for concern of wound infection, this involves an extensive number of treatment options, and is a complaint that carries with it a high risk of complications and morbidity.  The differential diagnosis includes cellulitis, gangrene, venous stasis, PAD, medication side effect   Co morbidities / Chronic conditions that complicate the patient evaluation  HTN, HLD, CAD, atrial fibrillation, CHF,  GERD, prostate cancer   Additional history obtained:  Additional history obtained from EMR External records from outside source obtained and reviewed including patient's family   Lab Tests:  I Ordered, and personally interpreted labs.  The pertinent results include: Leukocytosis is present.  Anemia is baseline.  Kidney function and electrolytes are normal.   Imaging Studies ordered:  I ordered imaging studies including  right tib-fib x-ray I independently visualized and interpreted imaging which showed soft tissue swelling I agree with the radiologist interpretation   Cardiac Monitoring: / EKG:  The patient was maintained on a cardiac monitor.  I personally viewed and interpreted the cardiac monitored which showed an underlying rhythm of: Sinus rhythm   Problem List / ED Course / Critical interventions / Medication management  Patient presenting for worsening pain, swelling, erythema, warmth, tenderness, and drainage from posterior right calf wound.  This wound has been present for the past month.  He did do 1 week of antibiotics last week.  For the past 4 days, his symptoms have been worsening.  He denies systemic symptoms.  On arrival in the ED, vital signs notable for moderate hypertension.  He is overall well-appearing on exam.  Wound appears as follows:  Swelling, erythema, tenderness, and pain extend to the level of mid calf.  Given his failure of outpatient antibiotic, plan will be for IV antibiotics and admission.  Vancomycin  and ceftriaxone  were ordered.  Lab work is notable for leukocytosis.  Patient was admitted for further management. I ordered medication including Percocet for analgesia, vancomycin  and ceftriaxone  for cellulitis/wound infection Reevaluation of the patient after these medicines showed that the patient improved I have reviewed the patients home medicines and have made adjustments as needed  Social Determinants of Health:  Lives at home with family       Final diagnoses:  Wound infection  Failure of outpatient treatment    ED Discharge Orders     None          Melvenia Motto, MD 11/02/23 1519

## 2023-11-02 NOTE — Progress Notes (Addendum)
 PHARMACY - ANTICOAGULATION CONSULT NOTE  Pharmacy Consult for warfarin  Indication: atrial fibrillation  Allergies  Allergen Reactions   Amoxicillin Swelling    penile swelling   Lisinopril Cough   Kenalog  [Triamcinolone ] Rash    Patient Measurements:    Vital Signs: Temp: 97.5 F (36.4 C) (09/01 1006) BP: 154/63 (09/01 1006) Pulse Rate: 70 (09/01 1006)  Labs: Recent Labs    11/02/23 1021  HGB 10.2*  HCT 31.9*  PLT 217  CREATININE 1.07    CrCl cannot be calculated (Unknown ideal weight.).   Medical History: Past Medical History:  Diagnosis Date   Acute CHF (HCC) 05/06/2013   Acute respiratory failure with hypoxia (HCC) 04/24/2023   Aortic stenosis    a. s/p tissue AVR 05/2013;  b. Echo (06/2013):  Mod LVH, EF 60-65%, no RWMA, Gr 2 DD, AVR ok (mean 13 mmHg), MAC, mild MR, mod LAE, mild RAE, PASP 46 mmHg (mild pulmo HTN)   Atrial fibrillation with RVR (HCC) 05/06/2013   Blood loss anemia    a. 05/2009 a/w GIB.   CAD (coronary artery disease)    a. Cath 04/2009: BMS to LCx 04/2009; CTO of RCA with L-R collaterals, 40% ostial diag.   Carpal tunnel syndrome    bilateral, carpal tunnel release x 2   Esophageal ulcer    a. 05/2009 with hemorrhage - injected with epinephrine -Dr. Lamar Bunk.   Esophagitis    a. 05/2009 a/w GIB.   GERD (gastroesophageal reflux disease)    EGD, Dr. Quay 2008   GI bleed    a. 05/2009 as above: with associated anemia. Lower esophageal ulcer with extensive erosive esophagitis and large clot by EGD 05/2009.    Hx of colonoscopy 2008   Dr. Cindy, polyps- 5 yr f/u recommended   Hyperlipidemia    Hypertension    Influenza A 04/24/2023   PAF (paroxysmal atrial fibrillation) (HCC)    a. Post-op AVR;  b. 04/2014 recurrent.   Perforated ear drum    right ear drum, tube in left ear drum, Dr. Jesus   Polymyalgia rheumatica [facility])    Prostate cancer [facility])    Vitamin D deficiency     Medications:  (Not in a hospital  admission)  Scheduled:   digoxin   0.125 mg Oral QPM   furosemide   80 mg Oral BID   [START ON 11/03/2023] metoprolol  succinate  50 mg Oral Daily   [START ON 11/03/2023] pantoprazole   40 mg Oral Daily   [START ON 11/03/2023] potassium chloride  SA  40 mEq Oral Daily   rosuvastatin   20 mg Oral QHS   Infusions:   vancomycin      [START ON 11/03/2023] vancomycin       Assessment: Patient is on warfarin for atrial fibrillation. Last Anti-coag visit 10/06/23 INR was 2.0 on 5 mg daily of warfarin. Noted new drug interactions with vancomycin  / ceftriaxone  which may increase INR.   Hb 10.2, Plt 217, INR 2.1   Goal of Therapy:  INR 2-3 Monitor platelets by anticoagulation protocol: Yes   Plan:  Continue home dose of warfarin - 5 mg daily  Daily INR   Rankin Sams 11/02/2023,12:48 PM

## 2023-11-02 NOTE — ED Triage Notes (Signed)
 Pt here from home with right leg redness and swollen , pt has a chemo injection done last Thursday to the right leg

## 2023-11-02 NOTE — Progress Notes (Signed)
 Pharmacy Antibiotic Note  Robert [name] is a 89 y.o. male admitted on 11/02/2023 with wound infection.  Pharmacy has been consulted for vancomycin  dosing.  Scr 1.07, weight ~ 75 kg, AUC of 423 on 750 mg q24h   Plan: Vancomycin  1500 mg x1 followed by 750 mg q24h  Follow cultures, clinical progression, and duration of therapy      Temp (24hrs), Avg:97.5 F (36.4 C), Min:97.5 F (36.4 C), Max:97.5 F (36.4 C)  Recent Labs  Lab 11/02/23 1021 11/02/23 1031  WBC 18.5*  --   CREATININE 1.07  --   LATICACIDVEN  --  1.3    CrCl cannot be calculated (Unknown ideal weight.).    Allergies  Allergen Reactions   Amoxicillin Swelling    penile swelling   Lisinopril Cough   Kenalog  [Triamcinolone ] Rash    Antimicrobials this admission: Vancomycin  9/1 >> c Ceftriaxone  9/1 >> c   Microbiology results: 9/1 BCx: pending    Thank you for allowing pharmacy to be a part of this patient's care.  Rankin Sams 11/02/2023 11:31 AM

## 2023-11-02 NOTE — ED Notes (Signed)
Pt and all belongings transported upstairs.  

## 2023-11-02 NOTE — ED Notes (Signed)
Pt ambulatory to restroom with walker 

## 2023-11-02 NOTE — ED Notes (Signed)
 Extra Blue, Red, SST, DG & Lav drawn

## 2023-11-02 NOTE — H&P (Signed)
 History and Physical   ANIVAL PASHA FMW:[id number] DOB: 1927/07/01 DOA: 11/02/2023  PCP: [name], PA   Patient coming from: Home  Chief Complaint: Leg wound  HPI: Robert [name] is a 89 y.o. male with medical history significant of hypertension, hyperlipidemia, GERD, CAD status post CABG, atrial fibrillation, tachybradycardia syndrome, status post pacemaker, aortic stenosis status post AVR, chronic combined systolic and diastolic CHF, prostate cancer, anemia presenting with worsening wound infection.  Patient has been dealing with a wound to his right calf for about a month.  He was seen in urgent care 2 weeks ago with concern for infection and was prescribed 7 days of doxycycline which she did complete.  4 days ago he followed up with dermatologist.  The wound was gently cleaned and he was advised to use antibiotic ointment.  He did have an injection performed for a squamous cell carcinoma near the site of the wound at that time because they did not want to biopsy it given its proximity to the wound.  Now for the past 4 days he has had worsening pain, swelling, erythema, warmth, tenderness and drainage from the wound.  Denies fevers, chills, chest pain, shortness of breath, abdominal pain, constipation, diarrhea, nausea, vomiting.  ED Course: Vital signs in the ED notable for blood pressure in the 150s systolic.  Lab workup included CMP with chloride 97, normal bicarb, gap 16, BUN 30, alk phos 32.  CBC with leukocytosis to 18.5, hemoglobin stable at 10.2.  Lactic acid normal.  ESR and CRP pending.  Blood cultures pending.  Right tib-fib x-ray pending.  Patient received oxycodone , ceftriaxone , vancomycin  in the ED.  Review of Systems: As per HPI otherwise all other systems reviewed and are negative.  Past Medical History:  Diagnosis Date   Acute CHF (HCC) 05/06/2013   Acute respiratory failure with hypoxia (HCC) 04/24/2023   Aortic stenosis    a. s/p tissue  AVR 05/2013;  b. Echo (06/2013):  Mod LVH, EF 60-65%, no RWMA, Gr 2 DD, AVR ok (mean 13 mmHg), MAC, mild MR, mod LAE, mild RAE, PASP 46 mmHg (mild pulmo HTN)   Atrial fibrillation with RVR (HCC) 05/06/2013   Blood loss anemia    a. 05/2009 a/w GIB.   CAD (coronary artery disease)    a. Cath 04/2009: BMS to LCx 04/2009; CTO of RCA with L-R collaterals, 40% ostial diag.   Carpal tunnel syndrome    bilateral, carpal tunnel release x 2   Esophageal ulcer    a. 05/2009 with hemorrhage - injected with epinephrine -Dr. Lamar Bunk.   Esophagitis    a. 05/2009 a/w GIB.   GERD (gastroesophageal reflux disease)    EGD, Dr. Quay 2008   GI bleed    a. 05/2009 as above: with associated anemia. Lower esophageal ulcer with extensive erosive esophagitis and large clot by EGD 05/2009.    Hx of colonoscopy 2008   Dr. Cindy, polyps- 5 yr f/u recommended   Hyperlipidemia    Hypertension    Influenza A 04/24/2023   PAF (paroxysmal atrial fibrillation) (HCC)    a. Post-op AVR;  b. 04/2014 recurrent.   Perforated ear drum    right ear drum, tube in left ear drum, Dr. Jesus   Polymyalgia rheumatica [facility])    Prostate cancer [facility])    Vitamin D deficiency     Past Surgical History:  Procedure Laterality Date   AORTIC VALVE REPLACEMENT N/A 05/12/2013   Procedure: AORTIC VALVE REPLACEMENT (AVR);  Surgeon: Maude  Fleeta Ochoa, MD;  Location: [facility] OR;  Service: Open Heart Surgery;  Laterality: N/A;   CARDIAC CATHETERIZATION  2011   CARDIOVERSION N/A 04/12/2020   Procedure: CARDIOVERSION;  Surgeon: Maranda Leim DEL, MD;  Location: [facility] ENDOSCOPY;  Service: Cardiovascular;  Laterality: N/A;   CORONARY ARTERY BYPASS GRAFT N/A 05/12/2013   Procedure: CORONARY ARTERY BYPASS GRAFTING (CABG);  Surgeon: Maude Fleeta Ochoa, MD;  Location: [facility] OR;  Service: Open Heart Surgery;  Laterality: N/A;  CABG x 1 using left leg greater saphenous vein harvested endoscopically   INTRAOPERATIVE TRANSESOPHAGEAL ECHOCARDIOGRAM N/A  05/12/2013   Procedure: INTRAOPERATIVE TRANSESOPHAGEAL ECHOCARDIOGRAM;  Surgeon: Maude Fleeta Ochoa, MD;  Location: [facility] OR;  Service: Open Heart Surgery;  Laterality: N/A;   LEFT AND RIGHT HEART CATHETERIZATION WITH CORONARY ANGIOGRAM N/A 05/09/2013   Procedure: LEFT AND RIGHT HEART CATHETERIZATION WITH CORONARY ANGIOGRAM;  Surgeon: Dorn JINNY Lesches, MD;  Location: [facility] CATH LAB;  Service: Cardiovascular;  Laterality: N/A;   MAZE N/A 05/12/2013   Procedure: MAZE;  Surgeon: Maude Fleeta Ochoa, MD;  Location: [facility] OR;  Service: Open Heart Surgery;  Laterality: N/A;   PACEMAKER IMPLANT N/A 05/21/2020   Procedure: PACEMAKER IMPLANT;  Surgeon: Waddell Danelle ORN, MD;  Location: [facility] INVASIVE CV LAB;  Service: Cardiovascular;  Laterality: N/A;   PROSTATECTOMY     TEE WITHOUT CARDIOVERSION N/A 04/12/2020   Procedure: TRANSESOPHAGEAL ECHOCARDIOGRAM (TEE);  Surgeon: Maranda Leim DEL, MD;  Location: [facility] ENDOSCOPY;  Service: Cardiovascular;  Laterality: N/A;    Social History  reports that he has never smoked. He has never used smokeless tobacco. He reports that he does not drink alcohol and does not use drugs.  Allergies  Allergen Reactions   Amoxicillin Swelling    penile swelling   Lisinopril Cough   Kenalog  [Triamcinolone ] Rash    Family History  Problem Relation Age of Onset   CVA Mother    Kidney disease Father    Breast cancer Daughter   Reviewed on admission  Prior to Admission medications   Medication Sig Start Date End Date Taking? Authorizing Provider  doxycycline (VIBRAMYCIN) 100 MG capsule Take 100 mg by mouth 2 (two) times daily. 10/23/23  Yes [provider]  mupirocin  ointment (BACTROBAN ) 2 % Apply 1 Application topically 2 (two) times daily. 10/29/23  Yes [provider]  rosuvastatin  (CRESTOR ) 20 MG tablet Take 20 mg by mouth at bedtime. 09/26/23  Yes [provider]  acetaminophen  (TYLENOL ) 325 MG tablet Take 650 mg by mouth every 6 (six) hours as needed for moderate  pain.    [provider]  Ascorbic Acid (VITAMIN C PO) Take 1 tablet by mouth daily.    [provider]  Cholecalciferol  (DIALYVITE VITAMIN D 5000) 125 MCG (5000 UT) capsule Take 5,000 Units by mouth 2 (two) times a week.    [provider]  digoxin  (LANOXIN ) 0.125 MG tablet Take 1 tablet (0.125 mg total) by mouth every evening. 04/25/23   Arlon Carliss ORN, DO  furosemide  (LASIX ) 80 MG tablet Take 1 tablet (80 mg total) by mouth 2 (two) times daily. 09/18/23   Croitoru, Mihai, MD  KLOR-CON  M20 20 MEQ tablet Take 2 tablets (40 mEq total) by mouth daily. 08/12/23   Chandrasekhar, Stanly LABOR, MD  metolazone  (ZAROXOLYN ) 2.5 MG tablet Take 1 tablet (2.5 mg total) by mouth once a week. 08/31/23   Duke, Jon Garre, PA  metoprolol  succinate (TOPROL -XL) 50 MG 24 hr tablet Take 1 tablet (50 mg total) by mouth daily. Take  with or immediately following a meal. 09/25/23   Duke, Jon Garre, PA  pantoprazole  (PROTONIX ) 40 MG tablet Take 40 mg by mouth daily. 03/13/23   [provider]  warfarin (COUMADIN ) 5 MG tablet TAKE 1 TABLET DAILY AS DIRECTED BY THE COUMADIN  CLINIC. 12/30/22   Dunn, Dayna N, PA-C    Physical Exam: Vitals:   11/02/23 1006  BP: (!) 154/63  Pulse: 70  Resp: 16  Temp: (!) 97.5 F (36.4 C)  SpO2: 96%    Physical Exam Constitutional:      General: He is not in acute distress.    Appearance: Normal appearance.  HENT:     Head: Normocephalic and atraumatic.     Mouth/Throat:     Mouth: Mucous membranes are moist.     Pharynx: Oropharynx is clear.  Eyes:     Extraocular Movements: Extraocular movements intact.     Pupils: Pupils are equal, round, and reactive to light.  Cardiovascular:     Rate and Rhythm: Normal rate and regular rhythm.     Pulses: Normal pulses.     Heart sounds: Normal heart sounds.  Pulmonary:     Effort: Pulmonary effort is normal. No respiratory distress.     Breath sounds: Normal breath sounds.  Abdominal:      General: Bowel sounds are normal. There is no distension.     Palpations: Abdomen is soft.     Tenderness: There is no abdominal tenderness.  Musculoskeletal:        General: No swelling or deformity.     Comments: Wound right lower extremity bandaged.  Right lower extremity with erythema, edema, warmth which is on top of baseline venous stasis changes when compared to contralateral lower extremity.  Skin:    General: Skin is warm and dry.  Neurological:     General: No focal deficit present.     Mental Status: Mental status is at baseline.        Labs on Admission: I have personally reviewed following labs and imaging studies  CBC: Recent Labs  Lab 11/02/23 1021  WBC 18.5*  HGB 10.2*  HCT 31.9*  MCV 99.1  PLT 217    Basic Metabolic Panel: Recent Labs  Lab 11/02/23 1021  NA 140  K 3.5  CL 97*  CO2 27  GLUCOSE 93  BUN 30*  CREATININE 1.07  CALCIUM  9.4    GFR: CrCl cannot be calculated (Unknown ideal weight.).  Liver Function Tests: Recent Labs  Lab 11/02/23 1021  AST 24  ALT 15  ALKPHOS 32*  BILITOT 1.1  PROT 6.8  ALBUMIN  3.9    Urine analysis:    Component Value Date/Time   COLORURINE YELLOW 04/24/2023 1419   APPEARANCEUR CLEAR 04/24/2023 1419   LABSPEC 1.012 04/24/2023 1419   PHURINE 6.0 04/24/2023 1419   GLUCOSEU NEGATIVE 04/24/2023 1419   HGBUR SMALL (A) 04/24/2023 1419   BILIRUBINUR NEGATIVE 04/24/2023 1419   KETONESUR NEGATIVE 04/24/2023 1419   PROTEINUR NEGATIVE 04/24/2023 1419   UROBILINOGEN 1.0 05/11/2013 2340   NITRITE NEGATIVE 04/24/2023 1419   LEUKOCYTESUR NEGATIVE 04/24/2023 1419    Radiological Exams on Admission: No results found.  EKG: Not performed in emergency department.  Will order.  Assessment/Plan Active Problems:   Coronary atherosclerosis of native coronary artery   Essential hypertension   HLD (hyperlipidemia)   Reflux esophagitis   Paroxysmal atrial fibrillation (HCC)   Aortic stenosis   S/P AVR    Tachycardia-bradycardia syndrome (HCC)   Pacemaker  GERD (gastroesophageal reflux disease)   Anemia of chronic disease   S/P CABG x 1   Chronic combined systolic and diastolic CHF (congestive heart failure) (HCC)   Wound infection Cellulitis > Patient presenting with worsening wound changes now concerning for infection with surrounding cellulitis as per HPI. > Worsening swelling, erythema, warmth, tenderness, drainage.  Leukocytosis 18.5.  Has completed a course of doxycycline outpatient wound infection has recurred. > Received ceftriaxone  and vancomycin  in the ED.  Right lower extremity x-rays pending. > Had an injection of fluorouracil at dermatologist 4 days ago. - Monitor on telemetry overnight - Continue with vancomycin  for now - MRSA screen - Follow-up blood cultures - Follow-up right lower extremity x-ray - Trend fever curve and WBC - Supportive care  Hypertension - Continue home Lasix , metoprolol   Hyperlipidemia - Continue home rosuvastatin   GERD - Continue PPI  CAD > Status post CABG - Continue home metoprolol , rosuvastatin , warfarin  Atrial fibrillation Tachybradycardia syndrome > Status post pacemaker - Continue home digoxin , metoprolol , warfarin  Aortic stenosis - Status post aortic valve replacement  Chronic combined systolic and diastolic CHF > Last echo was in March and showed EF 60-60%, indeterminate diastolic function, mildly reduced RV function.  Also noted moderate right pleural effusion, mild to moderate mitral regurgitation, moderate tricuspid regurgitation, status post AVR. - Continue home digoxin , Lasix , metoprolol  - Patient takes metolazone  on Mondays and did take it today - I's and O's, daily weights  Anemia > Hemoglobin stable at 10.2. - Trend CBC  Prostate cancer - [facility]  He is on Lupron  every 6 months.  DVT prophylaxis: Warfarin Code Status:   Full Family Communication:  Updated at bedside Disposition  Plan:   Patient is from:  Home  Anticipated DC to:  Home  Anticipated DC date:  1 to 2 days  Anticipated DC barriers: None  Consults called:  None Admission status:  Observation, telemetry  Severity of Illness: The appropriate patient status for this patient is OBSERVATION. Observation status is judged to be reasonable and necessary in order to provide the required intensity of service to ensure the patient's safety. The patient's presenting symptoms, physical exam findings, and initial radiographic and laboratory data in the context of their medical condition is felt to place them at decreased risk for further clinical deterioration. Furthermore, it is anticipated that the patient will be medically stable for discharge from the hospital within 2 midnights of admission.    Marsa KATHEE Scurry MD [facility]  How to contact the [facility] Attending or Consulting provider 7A - 7P or covering provider during after hours 7P -7A, for this patient?   Check the care team in [facility] and look for a) attending/consulting [facility] provider listed and b) the [facility] team listed Log into [email or web address] and use [place]'s universal password to access. If you do not have the password, please contact the hospital operator. Locate the [facility] provider you are looking for under [facility] and page to a number that you can be directly reached. If you still have difficulty reaching the provider, please page the [facility] (Director on Call) for the Hospitalists listed on amion for assistance.  11/02/2023, 12:27 PM

## 2023-11-02 NOTE — ED Triage Notes (Signed)
 Wound infection to right lower leg

## 2023-11-03 DIAGNOSIS — K2100 Gastro-esophageal reflux disease with esophagitis, without bleeding: Secondary | ICD-10-CM | POA: Diagnosis present

## 2023-11-03 DIAGNOSIS — E785 Hyperlipidemia, unspecified: Secondary | ICD-10-CM | POA: Diagnosis present

## 2023-11-03 DIAGNOSIS — Z66 Do not resuscitate: Secondary | ICD-10-CM | POA: Diagnosis present

## 2023-11-03 DIAGNOSIS — I350 Nonrheumatic aortic (valve) stenosis: Secondary | ICD-10-CM | POA: Diagnosis not present

## 2023-11-03 DIAGNOSIS — I110 Hypertensive heart disease with heart failure: Secondary | ICD-10-CM | POA: Diagnosis present

## 2023-11-03 DIAGNOSIS — Z7901 Long term (current) use of anticoagulants: Secondary | ICD-10-CM | POA: Diagnosis not present

## 2023-11-03 DIAGNOSIS — Z950 Presence of cardiac pacemaker: Secondary | ICD-10-CM | POA: Diagnosis not present

## 2023-11-03 DIAGNOSIS — I4821 Permanent atrial fibrillation: Secondary | ICD-10-CM | POA: Diagnosis present

## 2023-11-03 DIAGNOSIS — I495 Sick sinus syndrome: Secondary | ICD-10-CM | POA: Diagnosis present

## 2023-11-03 DIAGNOSIS — Z951 Presence of aortocoronary bypass graft: Secondary | ICD-10-CM | POA: Diagnosis not present

## 2023-11-03 DIAGNOSIS — Z8546 Personal history of malignant neoplasm of prostate: Secondary | ICD-10-CM | POA: Diagnosis not present

## 2023-11-03 DIAGNOSIS — T148XXA Other injury of unspecified body region, initial encounter: Secondary | ICD-10-CM | POA: Diagnosis present

## 2023-11-03 DIAGNOSIS — Z823 Family history of stroke: Secondary | ICD-10-CM | POA: Diagnosis not present

## 2023-11-03 DIAGNOSIS — L03115 Cellulitis of right lower limb: Secondary | ICD-10-CM | POA: Diagnosis present

## 2023-11-03 DIAGNOSIS — Z803 Family history of malignant neoplasm of breast: Secondary | ICD-10-CM | POA: Diagnosis not present

## 2023-11-03 DIAGNOSIS — D638 Anemia in other chronic diseases classified elsewhere: Secondary | ICD-10-CM | POA: Diagnosis present

## 2023-11-03 DIAGNOSIS — E876 Hypokalemia: Secondary | ICD-10-CM | POA: Diagnosis present

## 2023-11-03 DIAGNOSIS — M353 Polymyalgia rheumatica: Secondary | ICD-10-CM | POA: Diagnosis present

## 2023-11-03 DIAGNOSIS — Z8572 Personal history of non-Hodgkin lymphomas: Secondary | ICD-10-CM | POA: Diagnosis not present

## 2023-11-03 DIAGNOSIS — I5042 Chronic combined systolic (congestive) and diastolic (congestive) heart failure: Secondary | ICD-10-CM | POA: Diagnosis present

## 2023-11-03 DIAGNOSIS — Z841 Family history of disorders of kidney and ureter: Secondary | ICD-10-CM | POA: Diagnosis not present

## 2023-11-03 DIAGNOSIS — L089 Local infection of the skin and subcutaneous tissue, unspecified: Secondary | ICD-10-CM | POA: Diagnosis not present

## 2023-11-03 DIAGNOSIS — C61 Malignant neoplasm of prostate: Secondary | ICD-10-CM | POA: Diagnosis present

## 2023-11-03 DIAGNOSIS — Z79899 Other long term (current) drug therapy: Secondary | ICD-10-CM | POA: Diagnosis not present

## 2023-11-03 DIAGNOSIS — I739 Peripheral vascular disease, unspecified: Secondary | ICD-10-CM | POA: Diagnosis present

## 2023-11-03 DIAGNOSIS — I10 Essential (primary) hypertension: Secondary | ICD-10-CM | POA: Diagnosis not present

## 2023-11-03 DIAGNOSIS — Z953 Presence of xenogenic heart valve: Secondary | ICD-10-CM | POA: Diagnosis not present

## 2023-11-03 DIAGNOSIS — I2510 Atherosclerotic heart disease of native coronary artery without angina pectoris: Secondary | ICD-10-CM | POA: Diagnosis present

## 2023-11-03 LAB — COMPREHENSIVE METABOLIC PANEL WITH GFR
ALT: 14 U/L (ref 0–44)
AST: 19 U/L (ref 15–41)
Albumin: 3.5 g/dL (ref 3.5–5.0)
Alkaline Phosphatase: 31 U/L — ABNORMAL LOW (ref 38–126)
Anion gap: 16 — ABNORMAL HIGH (ref 5–15)
BUN: 25 mg/dL — ABNORMAL HIGH (ref 8–23)
CO2: 27 mmol/L (ref 22–32)
Calcium: 9.3 mg/dL (ref 8.9–10.3)
Chloride: 97 mmol/L — ABNORMAL LOW (ref 98–111)
Creatinine, Ser: 1 mg/dL (ref 0.61–1.24)
GFR, Estimated: >60 mL/min (ref >60)
Glucose, Bld: 107 mg/dL — ABNORMAL HIGH (ref 70–99)
Potassium: 3.4 mmol/L — ABNORMAL LOW (ref 3.5–5.1)
Sodium: 140 mmol/L (ref 135–145)
Total Bilirubin: 0.8 mg/dL (ref 0.0–1.2)
Total Protein: 6.4 g/dL — ABNORMAL LOW (ref 6.5–8.1)

## 2023-11-03 LAB — CBC
HCT: 28.7 % — ABNORMAL LOW (ref 39.0–52.0)
Hemoglobin: 9.5 g/dL — ABNORMAL LOW (ref 13.0–17.0)
MCH: 32 pg (ref 26.0–34.0)
MCHC: 33.1 g/dL (ref 30.0–36.0)
MCV: 96.6 fL (ref 80.0–100.0)
Platelets: 185 K/uL (ref 150–400)
RBC: 2.97 MIL/uL — ABNORMAL LOW (ref 4.22–5.81)
RDW: 16.8 % — ABNORMAL HIGH (ref 11.5–15.5)
WBC: 12.7 K/uL — ABNORMAL HIGH (ref 4.0–10.5)
nRBC: 0 % (ref 0.0–0.2)

## 2023-11-03 LAB — PROTIME-INR
INR: 2.5 — ABNORMAL HIGH (ref 0.8–1.2)
Prothrombin Time: 28.1 s — ABNORMAL HIGH (ref 11.4–15.2)

## 2023-11-03 LAB — MRSA NEXT GEN BY PCR, NASAL: MRSA by PCR Next Gen: NOT DETECTED

## 2023-11-03 MED ORDER — POTASSIUM CHLORIDE CRYS ER 20 MEQ PO TBCR
40.0000 meq | EXTENDED_RELEASE_TABLET | Freq: Every day | ORAL | Status: AC
  Administered 2023-11-03: 40 meq via ORAL
  Filled 2023-11-03: qty 2

## 2023-11-03 MED ORDER — ACETAMINOPHEN 325 MG PO TABS: 650.0000 mg | ORAL_TABLET | Freq: Four times a day (QID) | ORAL | Status: DC | PRN

## 2023-11-03 MED ORDER — ACETAMINOPHEN 500 MG PO TABS
500.0000 mg | ORAL_TABLET | Freq: Four times a day (QID) | ORAL | Status: DC | PRN
  Administered 2023-11-03 – 2023-11-07 (×6): 500 mg via ORAL
  Filled 2023-11-03 (×9): qty 1

## 2023-11-03 MED ORDER — HYDROCODONE-ACETAMINOPHEN 5-325 MG PO TABS: 1.0000 | ORAL_TABLET | Freq: Four times a day (QID) | ORAL | 0 refills | Status: AC | PRN

## 2023-11-03 MED ORDER — WARFARIN SODIUM 5 MG PO TABS
5.0000 mg | ORAL_TABLET | Freq: Every day | ORAL | Status: DC
  Administered 2023-11-03 – 2023-11-04 (×2): 5 mg via ORAL
  Filled 2023-11-03 (×4): qty 1

## 2023-11-03 MED ORDER — SODIUM CHLORIDE 0.9 % IV SOLN
1.0000 g | INTRAVENOUS | Status: DC
  Administered 2023-11-03 – 2023-11-07 (×5): 1 g via INTRAVENOUS
  Filled 2023-11-03 (×5): qty 10

## 2023-11-03 MED ORDER — FUROSEMIDE 40 MG PO TABS
140.0000 mg | ORAL_TABLET | Freq: Every day | ORAL | Status: DC
  Filled 2023-11-03 (×2): qty 1

## 2023-11-03 MED ORDER — LINEZOLID 600 MG/300ML IV SOLN
600.0000 mg | Freq: Two times a day (BID) | INTRAVENOUS | Status: DC
  Administered 2023-11-03 – 2023-11-05 (×5): 600 mg via INTRAVENOUS
  Filled 2023-11-03 (×12): qty 300

## 2023-11-03 MED ORDER — ONDANSETRON HCL 4 MG PO TABS
4.0000 mg | ORAL_TABLET | Freq: Four times a day (QID) | ORAL | Status: DC | PRN
  Filled 2023-11-03: qty 1

## 2023-11-03 NOTE — Progress Notes (Signed)
 Arrived to the PT's house to find him standing in the kitchen putting a new shirt on. PT is able to stand without immediate assistance of his walker or someone else helping him. PT used the restroom and then walked with assistance of his walker into the living room. EMT Jackquline set up the [employer] equipment in the home and taught the PT and family how to use it. Wearable was placed on PT's arm. Medications were placed in bins appropriately for when they were due. Medication requisition was performed by RN McWhite and paramedic Tyrice Hewitt. Medications were placed in temper resistant bags ([id number], [id number]).   IV abx were initiated. During the visit the PT started to c/o leg pain that he was rating a 3/10 on the pain scale. PT did not want Norco and had requested tylenol . MD Eldonna was contacted for an order of Tylenol  PO which was then administered to the PT.   Physical exam showed negative DCAPBTLS to the head, neck, or face. PERRLA. PT wears reader glasses. Negative JVD or tracheal deviation. Chest expansion is equal with non-labored breathing. ABD is soft, non-tender in all quadrants. PT's LBM was 11/02/2023. RLE is swollen with erythema. Slight tenderness upon palpation. Warm to the touch. +3 pitting edema. Wound on the posterior calf shows black edges with minor puss noted on the bandage that was placed prior to visit. +1 dorsal pulse. Wound was cleaned with saline and gauze, a 4x4 was placed on the wound with loose gauze and coban wrapped around. LLE showed no pitting edema with +2 dorsal pulse.   Safety Ax of the home was performed and found no hazards in the house. There is a small step down from the kitchen to the living room that the PT is able to navigate.   Paramedic Fleurette Woolbright reached out to MD [facility] for a wound care consult due to the wound on the back of his leg. Photos of the wound and leg were uploaded to the media section of his chart.

## 2023-11-03 NOTE — Progress Notes (Signed)
 This EMT went to the patients residence to reset his wearable device and to deliver DNR form. The pt was sitting in a recliner chair with his legs elevated. The wearable device was removed and re-calibrated. Virtual RN Darice was called via tablet to confirm proper readings at this time. Virtual RN Darice instructed this EMT to assist in the administration of 2 of the pt's evening medications. Medications and dosages were provided to virtual RN for verification.The pt took the medications without any issues. At this time the patient has no complaints and denies any further needs at this time. Pt made aware at this time that a Medic will be back later this evening to give bedtime medications.   *Patients daughter (Robert [name]) requested that the team call the patients home phone to inform them that we are coming for a visit. She said that we have been calling her and she would only liked to be called if pt does not answer the phone. The pt is VERY hard of hearing.*

## 2023-11-03 NOTE — Evaluation (Signed)
 Occupational Therapy Evaluation Patient Details Name: Robert [name] MRN: [id number] DOB: 1927-03-09 Today's Date: 11/03/2023   History of Present Illness   Pt is a 89 y.o. male admitted 9/1 with RLE wound infection. PMH:  HTN, hyperlipidemia, GERD, CAD s/p CABG, a fib, tachybradycardia syndrome, s/p PPM, aortic stenosis s/p AVR, chronic combined systolic and diastolic CHF, prostate cancer, anemia     Clinical Impressions Pt c/o mild pain to RLE, increases when weight bearing. Pt lives at home with wife, PLOF mod I for ADLs/mobility with rollator, some help for socks/shoes at times. Pt likely close to baseline, slight decrease in balance and LB dressing due to pain in RLE, but Pt is able to complete bed mobility mod I, ambulates with cane or RW with supervision for safety, managed toileting mod I. Pt reports he has all DME at home needed to remain safe/independent, no further acute OT or follow up needs.      If plan is discharge home, recommend the following:   A little help with bathing/dressing/bathroom;Assistance with cooking/housework;Assist for transportation     Functional Status Assessment   Patient has had a recent decline in their functional status and demonstrates the ability to make significant improvements in function in a reasonable and predictable amount of time.     Equipment Recommendations   None recommended by OT     Recommendations for Other Services         Precautions/Restrictions   Precautions Precautions: Fall Recall of Precautions/Restrictions: Intact Restrictions Weight Bearing Restrictions Per Provider Order: No     Mobility Bed Mobility Overal bed mobility: Modified Independent                  Transfers Overall transfer level: Needs assistance Equipment used: Rolling walker (2 wheels) Transfers: Sit to/from Stand Sit to Stand: Supervision           General transfer comment: supervision for safety      Balance  Overall balance assessment: Needs assistance Sitting-balance support: No upper extremity supported, Feet supported Sitting balance-Leahy Scale: Good     Standing balance support: Single extremity supported, During functional activity, Reliant on assistive device for balance Standing balance-Leahy Scale: Poor Standing balance comment: reliant on cane/RW                           ADL either performed or assessed with clinical judgement   ADL Overall ADL's : At baseline;Needs assistance/impaired         Upper Body Bathing: Set up;Sitting   Lower Body Bathing: Set up;Sitting/lateral leans   Upper Body Dressing : Set up;Sitting   Lower Body Dressing: Moderate assistance;Sit to/from stand   Toilet Transfer: Supervision/safety;Rolling walker (2 wheels)   Toileting- Clothing Manipulation and Hygiene: Set up;Supervision/safety       Functional mobility during ADLs: Supervision/safety;Rolling walker (2 wheels) General ADL Comments: Pt set up/supervision for most ADLs, min A for LB dressing due to pain in RLE and general stiffness, wife helps when needed     Vision Baseline Vision/History: 1 Wears glasses Ability to See in Adequate Light: 0 Adequate Patient Visual Report: No change from baseline       Perception         Praxis         Pertinent Vitals/Pain Pain Assessment Pain Assessment: Faces Faces Pain Scale: Hurts little more Pain Location: RLE Pain Descriptors / Indicators: Discomfort, Grimacing Pain Intervention(s): Monitored during session  Extremity/Trunk Assessment Upper Extremity Assessment Upper Extremity Assessment: Overall WFL for tasks assessed   Lower Extremity Assessment Lower Extremity Assessment: Defer to PT evaluation RLE Deficits / Details: errythema and edema noted distally   Cervical / Trunk Assessment Cervical / Trunk Assessment: Kyphotic   Communication Communication Communication: Impaired Factors Affecting  Communication: Hearing impaired   Cognition Arousal: Alert Behavior During Therapy: WFL for tasks assessed/performed Cognition: No apparent impairments                               Following commands: Intact       Cueing  General Comments   Cueing Techniques: Verbal cues      Exercises     Shoulder Instructions      Home Living Family/patient expects to be discharged to:: Private residence Living Arrangements: Spouse/significant other Available Help at Discharge: Family;Available 24 hours/day Type of Home: House Home Access: Elevator     Home Layout: Multi-level Alternate Level Stairs-Number of Steps: elevator   Bathroom Shower/Tub: Walk-in shower     Bathroom Accessibility: Yes   Home Equipment: Rollator (4 wheels);Shower seat - built in   Additional Comments: Pt lives with wife      Prior Functioning/Environment Prior Level of Function : Needs assist             Mobility Comments: mod I household mobility with rollator. No recent falls. ADLs Comments: wife does iADLs, mod I basic ADLs    OT Problem List: Decreased range of motion;Impaired balance (sitting and/or standing);Pain;Increased edema   OT Treatment/Interventions:        OT Goals(Current goals can be found in the care plan section)   Acute Rehab OT Goals Patient Stated Goal: to return home OT Goal Formulation: With patient Time For Goal Achievement: 11/17/23 Potential to Achieve Goals: Good   OT Frequency:       Co-evaluation              AM-PAC OT 6 Clicks Daily Activity     Outcome Measure Help from another person eating meals?: None Help from another person taking care of personal grooming?: A Little Help from another person toileting, which includes using toliet, bedpan, or urinal?: A Little Help from another person bathing (including washing, rinsing, drying)?: A Little Help from another person to put on and taking off regular upper body clothing?:  None Help from another person to put on and taking off regular lower body clothing?: A Little 6 Click Score: 20   End of Session Equipment Utilized During Treatment: Rolling walker (2 wheels) Nurse Communication: Mobility status  Activity Tolerance: Patient tolerated treatment well Patient left: in bed;with call bell/phone within reach;with bed alarm set  OT Visit Diagnosis: Other abnormalities of gait and mobility (R26.89);Muscle weakness (generalized) (M62.81);Pain Pain - Right/Left: Right Pain - part of body: Ankle and joints of foot                Time: 1120-1135 OT Time Calculation (min): 15 min Charges:  OT General Charges $OT Visit: 1 Visit OT Evaluation $OT Eval Low Complexity: 1 Low  [address], OTR/L   Elouise JONELLE Bott 11/03/2023, 11:42 AM

## 2023-11-03 NOTE — Progress Notes (Signed)
 Arrived to the PT's room to find him sitting upright on the edge of the bed, no obvious distress noted. PT's wife and son in-law are at the bedside. The [facility] program was explained to the family of what the visits and admission will look like. PT and family understood and all questions were answered.   Address, phone number, emergency contacts, and pharmacy were all confirmed.   Daughter, Scarlet, was called by paramedic Seini Lannom to update her on time of transport and answered all questions.

## 2023-11-03 NOTE — Evaluation (Signed)
 Physical Therapy Evaluation Patient Details Name: Robert [name] MRN: [id number] DOB: 01-02-1928 Today's Date: 11/03/2023  History of Present Illness  Pt is a 89 y.o. male admitted 9/1 with RLE wound infection. PMH:  HTN, hyperlipidemia, GERD, CAD s/p CABG, a fib, tachybradycardia syndrome, s/p PPM, aortic stenosis s/p AVR, chronic combined systolic and diastolic CHF, prostate cancer, anemia   Clinical Impression  PT eval complete. Pt is at baseline level for mobility. He reports little to no RLE pain; therefore, no impact noted on ambulation. Mod I bed mobility. Supervision transfers and ambulation 100' with rollator. Wife is present and confirms she is able to provide needed level of assist at home. No further skilled PT intervention indicated. Will defer to mobility team if pt remains in hospital. PT signing off.         If plan is discharge home, recommend the following: Assistance with cooking/housework   Can travel by private vehicle        Equipment Recommendations None recommended by PT  Recommendations for Other Services       Functional Status Assessment       Precautions / Restrictions Precautions Precautions: Fall      Mobility  Bed Mobility Overal bed mobility: Modified Independent                  Transfers Overall transfer level: Needs assistance Equipment used: Rollator (4 wheels) Transfers: Sit to/from Stand Sit to Stand: Supervision                Ambulation/Gait Ambulation/Gait assistance: Supervision Gait Distance (Feet): 100 Feet Assistive device: Rollator (4 wheels) Gait Pattern/deviations: Step-through pattern, Decreased stride length Gait velocity: decreased Gait velocity interpretation: <1.31 ft/sec, indicative of household ambulator   General Gait Details: steady gait with rollator  Stairs            Wheelchair Mobility     Tilt Bed    Modified Rankin (Stroke Patients Only)       Balance Overall  balance assessment: Needs assistance Sitting-balance support: No upper extremity supported, Feet supported Sitting balance-Leahy Scale: Good     Standing balance support: Reliant on assistive device for balance, During functional activity Standing balance-Leahy Scale: Poor                               Pertinent Vitals/Pain Pain Assessment Pain Assessment: 0-10 Pain Score: 2  Pain Location: RLE Pain Descriptors / Indicators: Discomfort Pain Intervention(s): Monitored during session    Home Living Family/patient expects to be discharged to:: Private residence Living Arrangements: Spouse/significant other Available Help at Discharge: Family;Available 24 hours/day Type of Home: House Home Access: Elevator     Alternate Level Stairs-Number of Steps: elevator Home Layout: Multi-level Home Equipment: Rollator (4 wheels)      Prior Function Prior Level of Function : Needs assist             Mobility Comments: mod I household mobility with rollator. No recent falls. ADLs Comments: wife does iADLs, mod I basic ADLs     Extremity/Trunk Assessment   Upper Extremity Assessment Upper Extremity Assessment: Defer to OT evaluation    Lower Extremity Assessment Lower Extremity Assessment: RLE deficits/detail RLE Deficits / Details: errythema and edema noted distally    Cervical / Trunk Assessment Cervical / Trunk Assessment: Kyphotic  Communication   Communication Communication: Impaired Factors Affecting Communication: Hearing impaired    Cognition Arousal: Alert Behavior During  Therapy: WFL for tasks assessed/performed   PT - Cognitive impairments: No apparent impairments                         Following commands: Intact       Cueing       General Comments      Exercises     Assessment/Plan    PT Assessment Patient does not need any further PT services  PT Problem List         PT Treatment Interventions      PT Goals  (Current goals can be found in the Care Plan section)  Acute Rehab PT Goals Patient Stated Goal: home PT Goal Formulation: All assessment and education complete, DC therapy    Frequency       Co-evaluation               AM-PAC PT 6 Clicks Mobility  Outcome Measure Help needed turning from your back to your side while in a flat bed without using bedrails?: None Help needed moving from lying on your back to sitting on the side of a flat bed without using bedrails?: None Help needed moving to and from a bed to a chair (including a wheelchair)?: A Little Help needed standing up from a chair using your arms (e.g., wheelchair or bedside chair)?: A Little Help needed to walk in hospital room?: A Little Help needed climbing 3-5 steps with a railing? : A Little 6 Click Score: 20    End of Session   Activity Tolerance: Patient tolerated treatment well Patient left: in bed;with call bell/phone within reach;with bed alarm set;with family/visitor present Nurse Communication: Mobility status PT Visit Diagnosis: Difficulty in walking, not elsewhere classified (R26.2)    Time: 9256-9241 PT Time Calculation (min) (ACUTE ONLY): 15 min   Charges:   PT Evaluation $PT Eval Low Complexity: 1 Low   PT General Charges $$ ACUTE PT VISIT: 1 Visit         Sari MATSU., PT  Office # [phone]   Erven Sari Shaker 11/03/2023, 8:23 AM

## 2023-11-03 NOTE — Progress Notes (Signed)
 PROGRESS NOTE        PATIENT DETAILS Name: Robert [name] Age: 89 y.o. Sex: male Date of Birth: [birth year] Admit Date: 11/02/2023 Admitting Physician Marsa KATHEE Scurry, MD ERE:[place], Schuyler [place], [place]  Brief Summary: Patient is a 89 y.o.  male with history of HTN, HLD, A-fib on Coumadin , CAD s/p CABG, aortic stenosis-s/p aortic valve replacement, PPM implantation, chronic HFpEF-presented with RLE cellulitis.  Recently seen in urgent care-and started on doxycycline-unfortunately-continue to have persistent symptoms necessitating ED visit.  Subsequently admitted to the hospitalist service and started on IV antibiotics.  Significant events: 9/1>> admit to [facility].  Significant studies: 9/1>> right tibia/fibula: Soft tissue swelling.  Significant microbiology data: 9/1>> blood culture: No growth  Procedures: None  Consults: None  Subjective: No major issues overnight-Per patient/spouse-right leg swelling has improved somewhat-still very erythematous-tender-moderately when palpated.  He is not keen on remaining in the hospital-but understands that he is not yet stable for discharge home as he requires IV antibiotics given the appearance of his leg.  Objective: Vitals: Blood pressure 128/63, pulse 72, temperature 98.4 F (36.9 C), temperature source Oral, resp. rate 20, weight 73.6 kg, SpO2 94%.   Exam: Gen Exam:Alert awake-not in any distress HEENT:atraumatic, normocephalic Chest: B/L clear to auscultation anteriorly CVS:S1S2 regular Abdomen:soft non tender, non distended Extremities:no edema Neurology: Non focal Skin: no rash  Pertinent Labs/Radiology:    Latest Ref Rng & Units 11/03/2023    5:41 AM 11/02/2023   10:21 AM 05/15/2023   12:13 PM  CBC  WBC 4.0 - 10.5 K/uL 12.7  18.5  9.9   Hemoglobin 13.0 - 17.0 g/dL 9.5  89.7  89.3   Hematocrit 39.0 - 52.0 % 28.7  31.9  32.4   Platelets 150 - 400 K/uL 185  217  146     Lab Results  Component  Value Date   NA 140 11/03/2023   K 3.4 (L) 11/03/2023   CL 97 (L) 11/03/2023   CO2 27 11/03/2023      Assessment/Plan: RLE cellulitis Failed outpatient antibiotics Some mild improvement overnight-however RLE is still inflamed-erythematous-tender and still significantly swollen. He does have a small-dry open wound in the posterior aspect of his calf-likely portal of entry. Continue IV antibiotics-till he is RLE swelling/erythema/leukocytosis much better. Follow cultures-but negative so far.  PAF Sinus rhythm Continue digoxin /metoprolol  INR therapeutic-warfarin per pharmacy  History of tachybradycardia syndrome Pacemaker in place  CAD s/p CABG No anginal symptoms Continue statin/beta-blocker Suspect not on ASA as patient on Coumadin   History of aortic stenosis-s/p aortic valve replacement Supportive care  Chronic HFpEF Euvolemic  HTN BP stable Continue metoprolol /Lasix   Hypokalemia Replete/recheck.  Normocytic anemia Secondary to chronic disease No evidence of blood loss Trend CBC  History of prostate cancer Lupron  every 6 months.   Code status:   Code Status: Full Code   DVT Prophylaxis warfarin (COUMADIN ) tablet 5 mg     Family Communication: Spouse at bedside   Disposition Plan: Status is: Observation The patient will require care spanning > 2 midnights and should be moved to inpatient because: Severity of illness   Planned Discharge Destination:Home   Diet: Diet Order             Diet Heart Room service appropriate? Yes; Fluid consistency: Thin  Diet effective now  Antimicrobial agents: Anti-infectives (From admission, onward)    Start     Dose/Rate Route Frequency Ordered Stop   11/03/23 1200  vancomycin  (VANCOREADY) IVPB 750 mg/150 mL        750 mg 150 mL/hr over 60 Minutes Intravenous Every 24 hours 11/02/23 1133     11/02/23 1130  vancomycin  (VANCOREADY) IVPB 1500 mg/300 mL        1,500 mg 150 mL/hr  over 120 Minutes Intravenous  Once 11/02/23 1126 11/02/23 1645   11/02/23 1115  cefTRIAXone  (ROCEPHIN ) 1 g in sodium chloride  0.9 % 100 mL IVPB        1 g 200 mL/hr over 30 Minutes Intravenous  Once 11/02/23 1109 11/02/23 1301        MEDICATIONS: Scheduled Meds:  digoxin   0.125 mg Oral QPM   furosemide   80 mg Oral BID   metoprolol  succinate  50 mg Oral Daily   pantoprazole   40 mg Oral Daily   rosuvastatin   20 mg Oral QHS   warfarin  5 mg Oral q1600   Warfarin - Pharmacist Dosing Inpatient   Does not apply q1600   Continuous Infusions:  vancomycin      PRN Meds:.acetaminophen  **OR** acetaminophen , HYDROcodone -acetaminophen , polyethylene glycol   I have personally reviewed following labs and imaging studies  LABORATORY DATA: CBC: Recent Labs  Lab 11/02/23 1021 11/03/23 0541  WBC 18.5* 12.7*  HGB 10.2* 9.5*  HCT 31.9* 28.7*  MCV 99.1 96.6  PLT 217 185    Basic Metabolic Panel: Recent Labs  Lab 11/02/23 1021 11/03/23 0541  NA 140 140  K 3.5 3.4*  CL 97* 97*  CO2 27 27  GLUCOSE 93 107*  BUN 30* 25*  CREATININE 1.07 1.00  CALCIUM  9.4 9.3    GFR: Estimated Creatinine Clearance: 40.6 mL/min (by C-G formula based on SCr of 1 mg/dL).  Liver Function Tests: Recent Labs  Lab 11/02/23 1021 11/03/23 0541  AST 24 19  ALT 15 14  ALKPHOS 32* 31*  BILITOT 1.1 0.8  PROT 6.8 6.4*  ALBUMIN  3.9 3.5   No results for input(s): LIPASE, AMYLASE in the last 168 hours. No results for input(s): AMMONIA in the last 168 hours.  Coagulation Profile: Recent Labs  Lab 11/02/23 1021 11/03/23 0541  INR 2.1* 2.5*    Cardiac Enzymes: No results for input(s): CKTOTAL, CKMB, CKMBINDEX, TROPONINI in the last 168 hours.  BNP (last 3 results) No results for input(s): PROBNP in the last 8760 hours.  Lipid Profile: No results for input(s): CHOL, HDL, LDLCALC, TRIG, CHOLHDL, LDLDIRECT in the last 72 hours.  Thyroid  Function Tests: No results for  input(s): TSH, T4TOTAL, FREET4, T3FREE, THYROIDAB in the last 72 hours.  Anemia Panel: No results for input(s): VITAMINB12, FOLATE, FERRITIN, TIBC, IRON, RETICCTPCT in the last 72 hours.  Urine analysis:    Component Value Date/Time   COLORURINE YELLOW 04/24/2023 1419   APPEARANCEUR CLEAR 04/24/2023 1419   LABSPEC 1.012 04/24/2023 1419   PHURINE 6.0 04/24/2023 1419   GLUCOSEU NEGATIVE 04/24/2023 1419   HGBUR SMALL (A) 04/24/2023 1419   BILIRUBINUR NEGATIVE 04/24/2023 1419   KETONESUR NEGATIVE 04/24/2023 1419   PROTEINUR NEGATIVE 04/24/2023 1419   UROBILINOGEN 1.0 05/11/2013 2340   NITRITE NEGATIVE 04/24/2023 1419   LEUKOCYTESUR NEGATIVE 04/24/2023 1419    Sepsis Labs: Lactic Acid, Venous    Component Value Date/Time   LATICACIDVEN 1.3 11/02/2023 1031    MICROBIOLOGY: Recent Results (from the past 240 hours)  Blood culture (routine x 2)  Status: None (Preliminary result)   Collection Time: 11/02/23 10:21 AM   Specimen: BLOOD  Result Value Ref Range Status   Specimen Description BLOOD RIGHT ANTECUBITAL  Final   Special Requests   Final    BOTTLES DRAWN AEROBIC AND ANAEROBIC Blood Culture adequate volume   Culture   Final    NO GROWTH < 24 HOURS Performed at [facility], [address], [place], [place] [address]    Report Status PENDING  Incomplete  Blood culture (routine x 2)     Status: None (Preliminary result)   Collection Time: 11/02/23 10:22 AM   Specimen: BLOOD  Result Value Ref Range Status   Specimen Description BLOOD LEFT ANTECUBITAL  Final   Special Requests   Final    BOTTLES DRAWN AEROBIC AND ANAEROBIC Blood Culture adequate volume   Culture   Final    NO GROWTH < 24 HOURS Performed at [facility], [address], [place], [place] [address]    Report Status PENDING  Incomplete    RADIOLOGY STUDIES/RESULTS: DG Tibia/Fibula Right Result Date: 11/02/2023 CLINICAL DATA:  89 year old male with wound infection.  EXAM: RIGHT TIBIA AND FIBULA - 2 VIEW COMPARISON:  None Available. FINDINGS: Four views. Advanced calcified peripheral vascular disease. Generalized soft tissue swelling and stranding. Maintained alignment at the right knee and ankle. No evidence of knee joint effusion. Right tibia and fibula appear intact. Partially visible possible neuropathic bony changes in the foot. But no acute osseous abnormality identified. IMPRESSION: Generalized soft tissue swelling. Advanced calcified peripheral vascular disease. No acute osseous abnormality identified in the right tib-fib. Electronically Signed   By: VEAR Hurst M.D.   On: 11/02/2023 12:55     LOS: 0 days   Donalda Applebaum, MD  [facility]    To contact the attending provider between 7A-7P or the covering provider during after hours 7P-7A, please log into the web site [email or web address] and access using universal [place] password for that web site. If you do not have the password, please call the hospital operator.  11/03/2023, 9:06 AM

## 2023-11-03 NOTE — Progress Notes (Signed)
 This EMT went to patients room [phone] with a WC to transport pt to his residence for the [facility] program. Upon entering the room the patient was resting in bed. The patient was assisted in getting dressed by this EMT and RN Shanda. The patient then ambulated to the [facility] and was taken outside and loaded into the [place]. The patient was secured with safety belts for the duration of transport. Upon arrival to the residence the patient was taken out of the fleeta and his walker was provided. The patient then ambulated with his walker onto the lift at the home to enter the residence. Once inside the patient ambulated to his recliner chair. The patient's equipment was then set up and the virtual RN was notified. Vital signs were obtained and recorded in the flowchart by Shanda PEAK. The patient's arm monitor was confirmed to be sending data to the virtual RN per Darice PEAK.

## 2023-11-03 NOTE — Progress Notes (Signed)
 1629 called patient  current health signaled tachypnea. Patient and spouse answered virtual call. Patient went to bathroom and was up eating with spouse. Denies distress, dizziness, chest pain/pressure or shortness of breath. Spoke calmly in full sentences. Provider notified, will ask Medic to check wearable for proper contact. Flag for excessive arm movement was triggered.

## 2023-11-03 NOTE — Plan of Care (Signed)
 Patient admitted to hospital at home program. He is at home with spouse and lives in apt behind daughters Teaching laboratory technician) house. Daughter prefers we call home number [phone]. If they don't answer call her at [phone]. After identification completed all scheduled medications given by medic well tolerated. Patient has decreased hearing in both ears wears a hearing aid in the left ear only. Medics took hat to measure I & O per orders. Was given PRN tylenol  with some relief for leg pain. Nursing will continue to monitor.  Problem: Education: Goal: Knowledge of General Education information will improve Description: Including pain rating scale, medication(s)/side effects and non-pharmacologic comfort measures Outcome: Progressing   Problem: Clinical Measurements: Goal: Ability to maintain clinical measurements within normal limits will improve Outcome: Progressing Goal: Will remain free from infection Outcome: Progressing Goal: Diagnostic test results will improve Outcome: Progressing   Problem: Activity: Goal: Risk for activity intolerance will decrease Outcome: Progressing   Problem: Coping: Goal: Level of anxiety will decrease Outcome: Progressing   Problem: Elimination: Goal: Will not experience complications related to bowel motility Outcome: Progressing   Problem: Safety: Goal: Ability to remain free from injury will improve Outcome: Progressing   Problem: Clinical Measurements: Goal: Ability to avoid or minimize complications of infection will improve Outcome: Progressing

## 2023-11-03 NOTE — Progress Notes (Signed)
 Patient care transferred to hospital at home providers at this time

## 2023-11-03 NOTE — Progress Notes (Signed)
 Virtual visit with Mr Robert [name], his wife, daughter and Elouise, the paramedic. Mr Robert [name] is comfortable, no distress.  Medications were discussed and the IV antibiotics were administered. Addressed all concerns and encouraged to call with any other questions or concerns.

## 2023-11-03 NOTE — Progress Notes (Signed)
 1750 EMT at patients house to recalibrate current health equipment. After patient identification complete early evening meds self-administered. Medic confirms evening visit. Nursing will continue to monitor.

## 2023-11-03 NOTE — Plan of Care (Signed)

## 2023-11-03 NOTE — Progress Notes (Signed)
 PHARMACY - ANTICOAGULATION CONSULT NOTE  Pharmacy Consult for warfarin  Indication: atrial fibrillation  Allergies  Allergen Reactions   Amoxicillin Swelling    penile swelling   Lisinopril Cough   Kenalog  [Triamcinolone ] Rash    Patient Measurements: Weight: 73.6 kg (162 lb 4.1 oz)  Vital Signs: Temp: 98.4 F (36.9 C) (09/02 0723) Temp Source: Oral (09/02 0723) BP: 128/63 (09/02 0723) Pulse Rate: 72 (09/02 0505)  Labs: Recent Labs    11/02/23 1021 11/03/23 0541  HGB 10.2* 9.5*  HCT 31.9* 28.7*  PLT 217 185  LABPROT 25.0* 28.1*  INR 2.1* 2.5*  CREATININE 1.07 1.00    Estimated Creatinine Clearance: 40.6 mL/min (by C-G formula based on SCr of 1 mg/dL).    Assessment: Patient is on warfarin for atrial fibrillation. Last Anti-coag visit 10/06/23 INR was 2.0 on 5 mg daily of warfarin. Noted new drug interactions with vancomycin  / ceftriaxone  which may increase INR.   Hb 10.2, Plt 217, INR 2.5  Goal of Therapy:  INR 2-3 Monitor platelets by anticoagulation protocol: Yes   Plan:  Continue home dose of warfarin - 5 mg daily  Daily INR   Robert [name] 11/03/2023,9:11 AM

## 2023-11-03 NOTE — Care Management Obs Status (Signed)
 MEDICARE OBSERVATION STATUS NOTIFICATION   Patient Details  Name: Robert [name] MRN: [id number] Date of Birth: [birth year]   Medicare Observation Status Notification Given:  Yes  verbally reviewed observation notice with Jenkins Bunker telephonically at [phone] will mail a copy to the patient home address    Claretta Deed 11/03/2023, 12:54 PM

## 2023-11-03 NOTE — Progress Notes (Signed)
 1300 Virtual with patient, Charity fundraiser and EMT and RN onsite. Patient home appears calm and cooperative accompanied by Scarlet patients daughter. Patient receiving IV Rocephin  followed by IV Linezolid . Completed admission information.  Current health equipment froze and O2 reading low 73- 83, RN & medic onsite took independent O2 reading patient at 99%. Patient in full conversation denies any distress.  Contacted current health. Waiting for response.

## 2023-11-03 NOTE — Plan of Care (Addendum)
 Hospital at [facility] Plan of Care/Progress Note    Robert [name]   FMW:[id number]  DOB: 08-Jul-1927  DOA: 11/02/2023     0 Date of Service: 11/03/2023     Subjective:  Patient is a 89 y.o.  male with history of HTN, HLD, A-fib on Coumadin , CAD s/p CABG, aortic stenosis-s/p aortic valve replacement, PPM implantation, chronic HFpEF-presented with RLE cellulitis.  Recently seen in urgent care-and started on doxycycline-unfortunately-continue to have persistent symptoms necessitating ED visit.  Subsequently admitted to the hospitalist service and started on IV antibiotics.  Pt reports mild improvement in symptoms overnight. Pt still reports mild redness and palpable tenderness. No subjective chills. Pt feels symptoms worsened after recent injection for squamous cell carcinoma. Does not report any alcohol, tobacco or illicit drug use. Performs all of his ADLs independently. Lives with his wife who is also ambulatory. Daughter lives 34ft from the patient's home on a similar property. Drives independently. Drives to [employer] for breakfast everyday per report.  Extensive discussion with patient as well as family at the bedside. The family is in  agreement for transfer into the hospital at home program.   Hospital Problems Assessment and Plan: RLE cellulitis Failed outpatient antibiotics with noted recent course of oral doxycycline- worsening symptoms  Some mild improvement overnight-however RLE is still inflamed-erythematous-tender and still significantly swollen. He does have a small-dry open wound in the posterior aspect of his calf-likely portal of entry. Continue IV antibiotics (IV linezolid  and rocephin  pending MRSA screen)-till he is RLE swelling/erythema/leukocytosis much better. Follow cultures-but negative so far.   PAF HR 60s-70s  Sinus rhythm Continue digoxin /metoprolol  INR therapeutic-warfarin per pharmacy   History of tachybradycardia syndrome S/p pacemaker 05/2020  HR currently  stable  Monitor     CAD s/p CABG 05/2013 No anginal symptoms Continue statin/beta-blocker Suspect not on ASA as patient on Coumadin    History of aortic stenosis-s/p aortic valve replacement Supportive care   Chronic HFpEF 2D ECHO 10/2023 w/ EF 60-65%  Euvolemic at present  Monitor volume status    HTN BP stable Continue metoprolol /Lasix    Hypokalemia Replete/recheck.   Normocytic anemia Secondary to chronic disease No evidence of blood loss Trend CBC   History of prostate cancer Lupron  every 6 months.       Objective Vital signs were reviewed and unremarkable. Vitals:   11/03/23 0000 11/03/23 0500 11/03/23 0505 11/03/23 0723  BP: (!) 119/55  (!) 140/65 128/63  Pulse: 65  72   Temp: 98.4 F (36.9 C)  98.8 F (37.1 C) 98.4 F (36.9 C)  Resp: 20  20   Weight:  73.6 kg    SpO2: 93%  94%   TempSrc: Oral  Oral Oral    Exam Physical Exam Constitutional:      Appearance: He is normal weight.  HENT:     Head: Normocephalic and atraumatic.     Nose: Nose normal.     Mouth/Throat:     Mouth: Mucous membranes are moist.  Eyes:     Pupils: Pupils are equal, round, and reactive to light.  Cardiovascular:     Rate and Rhythm: Normal rate and regular rhythm.  Pulmonary:     Effort: Pulmonary effort is normal.  Abdominal:     General: Bowel sounds are normal.  Musculoskeletal:        General: Normal range of motion.  Skin:    Comments: See picture    Neurological:     General: No focal deficit present.  Psychiatric:  Mood and Affect: Mood normal.        Picture 11/02/23     Picture 11/03/23    Labs / Other Information There are no new results to review at this time. DG Tibia/Fibula Right CLINICAL DATA:  89 year old male with wound infection.  EXAM: RIGHT TIBIA AND FIBULA - 2 VIEW  COMPARISON:  None Available.  FINDINGS: Four views. Advanced calcified peripheral vascular disease. Generalized soft tissue swelling and stranding.  Maintained alignment at the right knee and ankle. No evidence of knee joint effusion. Right tibia and fibula appear intact. Partially visible possible neuropathic bony changes in the foot. But no acute osseous abnormality identified.  IMPRESSION: Generalized soft tissue swelling. Advanced calcified peripheral vascular disease. No acute osseous abnormality identified in the right tib-fib.  Electronically Signed   By: VEAR Hurst M.D.   On: 11/02/2023 12:55  Lab Results  Component Value Date   WBC 12.7 (H) 11/03/2023   HGB 9.5 (L) 11/03/2023   HCT 28.7 (L) 11/03/2023   MCV 96.6 11/03/2023   PLT 185 11/03/2023   Last metabolic panel Lab Results  Component Value Date   GLUCOSE 107 (H) 11/03/2023   NA 140 11/03/2023   K 3.4 (L) 11/03/2023   CL 97 (L) 11/03/2023   CO2 27 11/03/2023   BUN 25 (H) 11/03/2023   CREATININE 1.00 11/03/2023   GFRNONAA >60 11/03/2023   CALCIUM  9.3 11/03/2023   PHOS 3.8 04/25/2023   PROT 6.4 (L) 11/03/2023   ALBUMIN  3.5 11/03/2023   LABGLOB 1.5 04/21/2023   BILITOT 0.8 11/03/2023   ALKPHOS 31 (L) 11/03/2023   AST 19 11/03/2023   ALT 14 11/03/2023   ANIONGAP 16 (H) 11/03/2023    Hospital at Home Admission Criteria Checklist:  Formal consent explained in detail and signed at the bedside:yes  Patient meets inpatient admission criteria (see below for further details)  Is pt Medicare FFS/[employer] Medicare-Medicaid, [employer], [employer] ( required for initial launch with plan to expand)? yes Lives within 25 mil/ 30 min from [facility] within [place](pt may stay with family member during admission who lives within 25 miles or 30 min from [facility] w[facility]in [facility]) ? yes  Hemodynamically stable with relatively low risk of clinical deterioration-not requiring ICU? Stable  Age >55?yes Does not require frequent touch-points or complex interventions/medications (ie Titrated Infusions (IV insulin , heparin  drips, vasoactive drips, use of infused or injectable  controlled substances, patients on insulin )?  Any Behavioral Health comorbidities likely to increase risk for in-home care (ie Acute delirium or experiencing a marked altered mental status and cause is not a treatable condition in the home)? No touchpoints needed   Has the patient been on BIPAP during course of ED evaluation or hospitalization?no  IF YES, Has the patient been off of BIPAP for >24 hours(If NO-THEN PATIENT DOES MEET INCLUSION CRITERIA)?n/a  Needs oxygen at home (<6L)? no Active safety concerns (ie Unable to use bedside commode independently and lacks caregiver support for safety- needs SNF placement, unable to obtain IV access)? No safety concerns  Has skin check been performed?pending  Has Physical Therapy screened the patient?yes  Common admission diagnoses including: CAP, COPD Exacerbation, Acute on chronic heart failure, Cellulitis, UTI , dehydration, acute resp failure with hypoxia (requiring <5L)   Social Screening: Denies significant ETOH intake? No  Does not smoke and understands may not smoke in the presence of oxygen? No tobacco use   Patient states able to use iPad/phone for communication/has family who is able to use?  yes  Patient has agreed to be compliant with medication and treatment regimen of the program?yes Any active drug use in patient or primary caregiver including daily dosing of methadone?  no Stable home environment ( access to appropriate heating in cold conditions and/or appropriate air conditioning in hot conditions and/or no running water/electricity)? yes  No aggressive pets at home? No  Firearm present  with ability or willingness to store them unloaded in a locked case for duration of hospitalization? N/a   Ambulatory? Fully ambulatory  (independently cane, walker  wheelchair bound, bed bound) Bed bugs present on home evaluation?no  Family support system in place? yes Patient feels safe at home does not endorse any violence? yes Any actively  decompensated behavioral healthy issues including agitation/aggressive behavior? No    Patient requests food to be provided by hospital home program?no  PT/OT eval completed and not requiring SNF, ALF, inpatient rehab? Yes    To be admitted to the Hospital at [facility] program, a patient generally must meet the following: 1. Requirement for Inpatient Level of Care: The patient's condition must necessitate an inpatient level of care. This is typically indicated by one or more of the following, depending on their specific diagnosis: -Persistent tachycardia despite appropriate treatment (e.g., for Heart Failure, UTI). - Persistent tachypnea (rapid breathing) or dyspnea (shortness of breath) that hasn't improved sufficiently with observation care (e.g., for Heart Failure, Pneumonia, Viral Illness, COVID). - Hypoxemia (low oxygen levels), such as a new need for oxygen, an increased need from baseline, or specific oxygen saturation levels (e.g., SpO2 <90-94% depending on the condition) that persist despite observation (e.g., for Heart Failure, COPD, Pneumonia, Viral Illness, COVID). - Need for Intravenous (IV) hydration due to an inability to maintain oral hydration, which persists despite observation care (e.g., for Cellulitis, UTI, Viral Illness, COVID). - Specific to Heart Failure: Persistent pulmonary edema, indicated by a new oxygen need, lack of improvement with IV diuretics, and ongoing tachypnea/dyspnea. - Specific to COPD: A decrease in known baseline resting oxygen saturation (SpO2) by 4% or more, or an increase in pre-existing supplemental oxygen requirements, which persists despite observation and requires continued close monitoring. - Specific to Pneumonia: A Pneumonia Severity Index (PSI) class IV (moderate risk). - Specific to Cellulitis: Failure of outpatient antibiotic therapy (indicated by progression or no improvement after a minimum of 48 hours on an adequate regimen) or a  clinical presentation (like acuity or rapidity of progression) that requires the intensity of monitoring found in an inpatient setting. - Specific to UTI: Persistence or worsening of clinical findings like fever, pain, or dehydration despite observation care; presence of significant uropathy; suspected infection of an indwelling prosthetic device, stent, implant, or graft; or pregnancy with suspected pyelonephritis. 2. Appropriateness for Hospital at Home Setting: The patient's overall clinical picture, including the severity of their illness, their care needs, and their medical history and comorbidities, must be suitable for management in the Hospital at Home environment. This essentially means that none of the exclusion criteria (listed below) are met.  Unified Exclusion Criteria for Hospital at Home Admission: A patient would not be eligible for Hospital at Home if any of the following are present: - Hemodynamic Instability: - Hypotension (low blood pressure) is present. - Respiratory Instability or Needs Beyond Program Capability: - There is a new need for invasive or noninvasive ventilatory assistance (like BiPAP or a ventilator). - Oxygenation is not sufficient, generally indicated if an FiO2 (fraction of inspired oxygen) of 45% (which is about 6 Liters/minute  via nasal cannula) or more is required to keep oxygen saturation (SpO2) at 90% or greater. - Monitoring or Procedural Needs Beyond Program Capability: - There is a need for invasive monitoring, such as a pulmonary artery catheter or an arterial line. - There is a need for immediate-response telemetry monitoring (for dangerous arrhythmia detection and subsequent immediate intervention). - The required medication regimen is beyond the capabilities of Hospital at Home (e.g., dosing intervals are too frequent for home administration). - There is a need for a procedure that cannot be performed by the [facility] at [facility] team (e.g.,  significant wound debridement or abscess drainage for cellulitis, or percutaneous nephrostomy for a complicated UTI). - Significant Organ Dysfunction or Markers of Severe Illness: - Mental status is not at baseline, or there is altered mental status suggestive of inadequate perfusion. - Renal (kidney) function is unstable or showing an ongoing decline. - There is evidence of inadequate perfusion, such as metabolic acidosis or myocardial ischemia. - Uncompensated acidosis is present. - Condition-Specific Severity or Complications Making Home Care Unsuitable: -For Heart Failure: Known severe cardiac valvular disease (e.g., aortic stenosis, mitral regurgitation); or severe peripheral edema that impairs the ability to urinate or ambulate. -For COPD: Known concurrent comorbidity or finding that indicates a higher-risk COPD exacerbation (e.g., pulmonary fibrosis, cavitation, pleural effusion, pneumothorax, rib fracture). -For Pneumonia: Pneumonia Severity Index (PSI) class V (indicating high risk for inpatient mortality); known concurrent comorbidity or finding that indicates higher-risk pneumonia (e.g., pulmonary fibrosis, cavitation, large or loculated pleural effusion); or a concomitant serious infectious process like endocarditis or empyema. -For Cellulitis: Orbital, periorbital, or necrotizing infection is suspected; or a concomitant serious infectious process like endocarditis, septic emboli, or septic joint space infection. -For UTI: Urinary tract obstruction (e.g., kidney stone, bladder outlet obstruction); or a concomitant serious infectious process like endocarditis or septic emboli. -For Viral Illness & COVID-19: A concomitant serious infectious process like endocarditis or empyema. General Comorbidities or Status: -The patient is significantly immunosuppressed (this applies to Pneumonia, Cellulitis, UTI, Viral Illness, and COVID-19). -The patient meets inpatient admission  criteria for a second diagnosis, or has care needs beyond the capabilities of Hospital at Home due to an active clinically significant comorbidity. (This is a general exclusion across all listed conditions)  Time spent: >60 minutes   [facility] 11/03/2023, 9:36 AM

## 2023-11-03 NOTE — Progress Notes (Signed)
 Arrived at residence and was met at the door by pt daughter. Pt was sitting on the recliner in no distress. Vitals were assessed and virtual nurse was contacted. IV antibiotics were administered after IV was assessed and flushed. Head to toe was also completed. The patient requested tylenol  since it was ordered for him but none was left at the house previously today, pt took home tylenol  and was documented in the [facility].SABRA

## 2023-11-04 LAB — BASIC METABOLIC PANEL WITH GFR
Anion gap: 11 (ref 5–15)
BUN: 27 mg/dL — ABNORMAL HIGH (ref 8–23)
CO2: 25 mmol/L (ref 22–32)
Calcium: 9.3 mg/dL (ref 8.9–10.3)
Chloride: 98 mmol/L (ref 98–111)
Creatinine, Ser: 0.89 mg/dL (ref 0.61–1.24)
GFR, Estimated: >60 mL/min (ref >60)
Glucose, Bld: 123 mg/dL — ABNORMAL HIGH (ref 70–99)
Potassium: 4.2 mmol/L (ref 3.5–5.1)
Sodium: 134 mmol/L — ABNORMAL LOW (ref 135–145)

## 2023-11-04 LAB — CBC
HCT: 30.3 % — ABNORMAL LOW (ref 39.0–52.0)
Hemoglobin: 9.7 g/dL — ABNORMAL LOW (ref 13.0–17.0)
MCH: 31.8 pg (ref 26.0–34.0)
MCHC: 32 g/dL (ref 30.0–36.0)
MCV: 99.3 fL (ref 80.0–100.0)
Platelets: 220 K/uL (ref 150–400)
RBC: 3.05 MIL/uL — ABNORMAL LOW (ref 4.22–5.81)
RDW: 16.7 % — ABNORMAL HIGH (ref 11.5–15.5)
WBC: 12.9 K/uL — ABNORMAL HIGH (ref 4.0–10.5)
nRBC: 0 % (ref 0.0–0.2)

## 2023-11-04 MED ORDER — FUROSEMIDE 40 MG PO TABS
80.0000 mg | ORAL_TABLET | Freq: Two times a day (BID) | ORAL | Status: DC
  Administered 2023-11-04 – 2023-11-07 (×7): 80 mg via ORAL
  Filled 2023-11-04 (×9): qty 2

## 2023-11-04 MED ORDER — MEDIHONEY WOUND/BURN DRESSING EX PSTE
1.0000 | PASTE | Freq: Every day | CUTANEOUS | Status: DC
  Administered 2023-11-04 – 2023-11-07 (×4): 1 via TOPICAL
  Filled 2023-11-04: qty 44

## 2023-11-04 NOTE — Progress Notes (Signed)
 Patient appears comfortable sitting in the recliner. PRN tylenol  given for minimal right leg pain. Denies headache, dizziness, and SOB. Plan of care reviewed with patient and spouse. Wound care team consulted. Pt agreeable to plan.

## 2023-11-04 NOTE — Progress Notes (Signed)
 PHARMACY - ANTICOAGULATION CONSULT NOTE  Pharmacy Consult for warfarin Indication: atrial fibrillation  Labs: Recent Labs    11/02/23 1021 11/03/23 0541  HGB 10.2* 9.5*  HCT 31.9* 28.7*  PLT 217 185  LABPROT 25.0* 28.1*  INR 2.1* 2.5*  CREATININE 1.07 1.00    Assessment/Plan: 89yo male admitted to [facility] program and to continue warfarin for Afib; pt seems to be stable on current home regimen with only one mild excursion from goal INR in the past several months.  Will obtain another INR with next lab draw but will not need to continue daily INR if it remains stable.  Continue warfarin 5mg  daily.  Marvetta Dauphin, PharmD, BCPS  11/04/2023,12:14 AM

## 2023-11-04 NOTE — Progress Notes (Signed)
 Hospital at Home Daily Progress Note   Patient: Robert [name]  MRN: [id number]  DOB: [birth year]  DOA: 11/02/2023  DOS: the patient was seen and examined on @TODAY @    Patient identified themself as Robert [name]  DOB 1927-07-28  Medic Norman Miner present at bedside during encounter and performed the physical exam. EMT Gaylan Hampshire also present and assisted with assessment. Patient was seen today via video chat; my physical location [place], [place]   Brief hospital course: HPI on admission to [facility] program 11/03/23: Patient is a 89 y.o.  male with history of HTN, HLD, A-fib on Coumadin , CAD s/p CABG, aortic stenosis-s/p aortic valve replacement, PPM implantation, chronic HFpEF-presented with RLE cellulitis.  Recently seen in urgent care-and started on doxycycline-unfortunately-continue to have persistent symptoms necessitating ED visit.  Subsequently admitted to the hospitalist service and started on IV antibiotics.  Pt reports mild improvement in symptoms overnight. Pt still reports mild redness and palpable tenderness. No subjective chills. Pt feels symptoms worsened after recent injection for squamous cell carcinoma. Does not report any alcohol, tobacco or illicit drug use. Performs all of his ADLs independently. Lives with his wife who is also ambulatory. Daughter lives 9ft from the patient's home on a similar property. Drives independently. Drives to [employer] for breakfast everyday per report.  Extensive discussion with patient as well as family at the bedside. The family is in  agreement for transfer into the hospital at home program.    Patient is being treated with IV antibiotics for right lower extremity cellulitis, after failing to improve on oral doxycycline previously in outpatient setting.  Further hospital course and management as outlined below.   Assessment and Plan:  RLE cellulitis Posterior right calf wound - likely triggered cellulitis Failed outpatient  antibiotics with noted recent course of oral doxycycline- worsening symptoms  9/2 - Some mild improvement overnight-however RLE is still inflamed-erythematous-tender and still significantly swollen. 9/3 - slight further improvement, remains with significant swelling, erythema, pain --Continue IV Rocephin , Linezolid  --Wound care RN consulted - appreciate wound care orders --Pain control PRN --Monitor clinically for improvement --Plan to continue IV antibiotics at least 48 more hours and/or significant further improvement prior to transition to PO --Consider ABI's if slow to improve, would assess peripheral circulation    PAF - HR's controlled. Sinus rhythm on admission. --Continue digoxin /metoprolol  --INR therapeutic-warfarin per pharmacy   History of tachybradycardia syndrome S/p pacemaker 05/2020  HR currently stable  --Monitor     CAD s/p CABG 05/2013 - stable, no chest pain. --Continue statin, beta-blocker --Suspect not on ASA as patient on Coumadin    History of aortic stenosis-s/p aortic valve replacement --Supportive care   Chronic HFpEF - appears compensated, euvolemic 2D ECHO 10/2023 w/ EF 60-65%  --Monitor volume status    HTN -- BP stable --Continue metoprolol /Lasix    Hypokalemia Replete/recheck.   Normocytic anemia Secondary to chronic disease No evidence of blood loss Trend CBC   History of prostate cancer Lupron  every 6 months.     Patient Active Problem List   Diagnosis Date Noted   Carcinoma of prostate (HCC) 11/02/2023   Wound infection 11/02/2023   S/P CABG x 1 08/12/2023   Chronic combined systolic and diastolic CHF (congestive heart failure) (HCC) 08/12/2023   Pleural effusion on right 04/24/2023   Subtherapeutic international normalized ratio (INR) 04/24/2023   GERD (gastroesophageal reflux disease) 04/24/2023   Anemia of chronic disease 04/24/2023   Pacemaker 09/06/2020   Tachycardia-bradycardia syndrome (HCC) 05/03/2020   Secondary  hypercoagulable state (HCC) 04/04/2020   Mixed conductive and sensorineural hearing loss of both ears 09/14/2018   Encounter for therapeutic drug monitoring 06/02/2014   Chronic anticoagulation 05/17/2014   Atrial flutter (HCC) 06/08/2013   Persistent atrial fibrillation (HCC) 05/19/2013   S/P AVR 05/12/2013   Aortic stenosis    Coronary atherosclerosis of native coronary artery 04/21/2013   Essential hypertension 04/21/2013   HLD (hyperlipidemia) 04/21/2013   Reflux esophagitis 04/21/2013   Paroxysmal atrial fibrillation (HCC) 04/21/2013           Subjective: Pt seen virtually during Medic's visit this AM.  Pt reports waking up with significant pain in the leg, it was difficult to bear weight or ambulate initially, but this slowly improved. He is using his walker.  He denies fever/chills or other acute complaints.  Overall feels leg is slowly improving.  EMT at home visit reports leg appears about the same compared to yesterday.   Physical Exam:    11/03/2023    8:00 PM 11/03/2023    1:09 PM 11/03/2023   11:24 AM  Vitals with BMI  Height  5' 3   Systolic 147 165 859  Diastolic 74 78 97  Pulse 65 73       General exam: awake, alert, no acute distress Respiratory system: on room air, normal respiratory effort. Cardiovascular system: normal S1/S2, RRR Gastrointestinal system: soft, NT, ND Central nervous system: A&O x3. no gross focal neurologic deficits, normal speech Extremities: SEE IMAGES below or Media tab RLE with erythema and swelling similar to yesterday Skin: dry, intact, normal temperature Psychiatry: normal mood, congruent affect, judgement and insight appear normal   Repeat Images 9/3 pending upload. Images below taken 9/2.           Data Reviewed:  Notable labs -- Na 134, Glucose 123, BUN 27, WBC 12.9 from 12.7 improved from initial 18.5, Hbg 9.5 >> 9.7   Family Communication: Wife present in the home during virtual  encounter  Disposition: Status is: Inpatient Remains inpatient appropriate because: remains on IV antibiotics pending further clinical improvement   Planned Discharge Destination: Home    Time spent: 45 minutes  Author: Burnard DELENA Cunning, DO [facility]  10/22/2023 7:00 AM  For on call review [email or web address].

## 2023-11-04 NOTE — Consult Note (Signed)
 WOC Nurse Consult Note: Reason for Consult: Patient with cellulitis to right posterior leg after nonhealing trauma wound x 1 month.  Has failed conservative antibiotic regiment.  Recent visit with dermatologist addressing squamous cell carcinoma to area just below this wound.  Dry bandage in place.  Wound type:trauma, nonhealing Pressure Injury POA: /NA Measurement: see nurse flowsheets.  (Health at home - remote consult)  Wound bed:90% devitalized tissue, thin Drainage (amount, consistency, odor) minimal serosanguinous  Periwound:Edema to right lower leg and erythema to periwound skin  Dressing procedure/placement/frequency: Cleanse trauma wound to right posterior leg with NS and pat dry Apply medihoney to wound bed to facilitate debridement. Cover with dry dressing. Change daily.  Will not follow at this time.  Please re-consult if needed.  Darice Cooley MSN, RN, FNP-BC CWON Wound, Ostomy, Continence Nurse [facility] [phone] Work cell phone:  [phone]

## 2023-11-04 NOTE — Progress Notes (Signed)
 Virtual visit with Robert [name], wife and Grayce, paramedic. Medications administered. Discussed daily weights and importance of monitoring for changes more than 3lbs/week. No concern or complaints. No pain. Encouraged to call with any concerns. Discussed need to call daughter overnight for response. They do not hear the phone.

## 2023-11-04 NOTE — Progress Notes (Signed)
 Arrived to the patients residence for the PM visit.    The patient was found sitting upright in the recliner, located in the living room. The patient has no complaints at this time. The wife voices the patients activity level is good moving around the house.   ABCs are intact, alert and oriented throughout the visit. No acute findings. Right left appears visible swollen without any discharge or fluid weeping. Pulses are intact.   Medication administered , vitals taken, wound care provided (per orders), and assessment completed.

## 2023-11-04 NOTE — Plan of Care (Signed)

## 2023-11-04 NOTE — Care Management (Signed)
 Transition of Care [facility]) - Inpatient Brief Assessment   Patient Details  Name: Robert [name] MRN: [id number] Date of Birth: 24-Jan-1928  Transition of Care [facility]) CM/SW Contact:    Corean JAYSON Canary, RN Phone Number: 11/04/2023, 11:31 AM   Clinical Narrative:  89 year old with no healing wound, cellulitis on IV abx. Lives with spouse, daughter lives on property as well.  Expected DC on Friday 9/5. Sees oncology OP. Wound care saline dressing w-d once daily.   No needs identified at this time, but CM will follow for any identified needs, recommendations, and transitions of care  Transition of Care Asessment: Insurance and Status: Insurance coverage has been reviewed Patient has primary care physician: Yes Home environment has been reviewed: Lives with spouse, daughter nearby Prior level of function:: Indepedent has walker Prior/Current Home Services: No current home services Social Drivers of Health Review: SDOH reviewed no interventions necessary Readmission risk has been reviewed: Yes Transition of care needs: no transition of care needs at this time

## 2023-11-04 NOTE — Progress Notes (Signed)
 Arrived at the patient for AM visit.   Found the patient sitting in the recliner located in the living room. The patient is in no acute distress. ABCs intact. Lung sounds are clear all fields.   Pain 5/10 noted, PRN analgesia administered.

## 2023-11-05 LAB — CBC
HCT: 30.2 % — ABNORMAL LOW (ref 39.0–52.0)
Hemoglobin: 9.8 g/dL — ABNORMAL LOW (ref 13.0–17.0)
MCH: 31.9 pg (ref 26.0–34.0)
MCHC: 32.5 g/dL (ref 30.0–36.0)
MCV: 98.4 fL (ref 80.0–100.0)
Platelets: 227 K/uL (ref 150–400)
RBC: 3.07 MIL/uL — ABNORMAL LOW (ref 4.22–5.81)
RDW: 16.7 % — ABNORMAL HIGH (ref 11.5–15.5)
WBC: 13 K/uL — ABNORMAL HIGH (ref 4.0–10.5)
nRBC: 0 % (ref 0.0–0.2)

## 2023-11-05 LAB — BASIC METABOLIC PANEL WITH GFR
Anion gap: 12 (ref 5–15)
BUN: 22 mg/dL (ref 8–23)
CO2: 29 mmol/L (ref 22–32)
Calcium: 9 mg/dL (ref 8.9–10.3)
Chloride: 96 mmol/L — ABNORMAL LOW (ref 98–111)
Creatinine, Ser: 1.04 mg/dL (ref 0.61–1.24)
GFR, Estimated: >60 mL/min (ref >60)
Glucose, Bld: 97 mg/dL (ref 70–99)
Potassium: 3.8 mmol/L (ref 3.5–5.1)
Sodium: 137 mmol/L (ref 135–145)

## 2023-11-05 LAB — PROTIME-INR
INR: 3.1 — ABNORMAL HIGH (ref 0.8–1.2)
Prothrombin Time: 33 s — ABNORMAL HIGH (ref 11.4–15.2)

## 2023-11-05 MED ORDER — WARFARIN SODIUM 2.5 MG PO TABS
2.5000 mg | ORAL_TABLET | Freq: Once | ORAL | Status: AC
  Administered 2023-11-05: 2.5 mg via ORAL
  Filled 2023-11-05: qty 1

## 2023-11-05 MED ORDER — LINEZOLID 600 MG PO TABS
600.0000 mg | ORAL_TABLET | Freq: Two times a day (BID) | ORAL | Status: DC
  Administered 2023-11-06 – 2023-11-07 (×3): 600 mg via ORAL
  Filled 2023-11-05 (×6): qty 1

## 2023-11-05 MED ORDER — WARFARIN SODIUM 5 MG PO TABS
5.0000 mg | ORAL_TABLET | Freq: Every day | ORAL | Status: DC
  Administered 2023-11-06: 5 mg via ORAL
  Filled 2023-11-05 (×2): qty 1

## 2023-11-05 NOTE — Progress Notes (Signed)
 Spoke with pt and [name] via phone. Robert [name] adm lasix  po with supervision of RN via teams camera. Pt states he was able to sit on his porch today. States he has had no issues today. Updated on paramedic visit this evening. Both agreeable

## 2023-11-05 NOTE — TOC Progression Note (Addendum)
 Transition of Care [facility]) - Progression Note    Patient Details  Name: Robert [name] MRN: [id number] Date of Birth: December 27, 1927  Transition of [facility]) CM/SW Contact  Robert Jon Bloch, RN Phone Number: 11/05/2023, 2:39 PM  Clinical Narrative:    Order noted for home health services. NCM spoke with daughter Scarlette 2/2 pt unable to hear on phone. Per Scarlette family not ready to commit to home health services, thinking they can manage themselves. States they would like to see how pt progresses before committing.  Inpatient CM following and will assist with need....   Expected Discharge Plan: Home/Self Care (vs home health services) Barriers to Discharge: Continued Medical Work up               Expected Discharge Plan and Services                                               Social Drivers of Health (SDOH) Interventions SDOH Screenings   Food Insecurity: No Food Insecurity (11/03/2023)  Housing: Unknown (11/03/2023)  Transportation Needs: No Transportation Needs (11/02/2023)  Utilities: Not At Risk (11/02/2023)  Social Connections: Moderately Isolated (11/02/2023)  Tobacco Use: Low Risk  (11/02/2023)    Readmission Risk Interventions     No data to display

## 2023-11-05 NOTE — Progress Notes (Signed)
 Completed virtual rounds with MD,paramedic at patient bedside. POC reviewed and discussed ,patient voices understanding and agreement. Pt reminded to call RN for any needs, RN and MD available at all times. Pt voices understanding. Pt aware of next planned visit and next call from RN.    Pts daughter [name] also present.

## 2023-11-05 NOTE — Progress Notes (Signed)
 Pt seen for routine [facility] evening visit. Pt appears well and says he feels pretty good. Pt has no complaints.  Vital sign and assessment obtained as noted. Pt has a temp of 100.0, virtual RN notified and Dr. Fausto made aware. Pt denies chills or feeling sweaty. Pt's home is slightly warmer. Dr. Fausto advises to have pt re check temperature after awhile and see if it is still high. Thermometer left with Pt and wife.   Dressing removed, wound measured and wound re-covered with new Mepilex.  Medications administered as noted. Iv care performed and curos cap added to IV pigtail.  Pt encouraged to call with any problems and virtual RN will call at 8 pm tonight to administer abx and record temp.

## 2023-11-05 NOTE — Progress Notes (Signed)
 Attempted to call patient to update on am visit time,no answer.  I did speak with pt's daughter Donzell and she is to pass along the message.

## 2023-11-05 NOTE — Progress Notes (Signed)
 This Clinical research associate arrived patients house, found patient sitting in his recliner. Patient's dressing was already off of his right leg. This writer looked at wound, photos were obtained and uploaded into EPIC (patients chart), cleaned wound with wound cleaner, let air dry. This writer applied prescribed medication to wound site and applied a mepilex. This Clinical research associate educated patient on elevating his feet and legs on pillows while sitting in his recliner and not let them dangle to help keep edema down. Patient acknowledged and said he would do that as best as he could, he stated he gets cramps easily when he does this and also has to frequent the bathroom. Patients labs were obtained, this writer transported labs in cooler to [facility] for processing.  Lavance Beazer P. Franchot, EMT

## 2023-11-05 NOTE — Progress Notes (Signed)
 This is for the patients AM visit.    Upon arrival, found the patient is sitting in the recliner in no signs of acute distress. ABCs are intact. The patient is Aox4.    The patients physical exam is unremarkable for new or worsening findings. Lung sounds are clear, heart tone are normal without mumurs. Skin is warm, dry and pink.    Assessment of the right leg shows no appreciated change in presentation from yesterdays PM visit. The patient did voice pain throughout the night and thought it may have been to the bandage being to tight. After further discussion, the patient denied any tingling in the extremity, color change or any other concerning complaints. The patient did remove the bandage and did feel some relief.    EMTs assisted with prescribed wound care orders. Patient tolerated well.    Vitals were taken, medications were administered, virtual visit facilitate without incident.

## 2023-11-05 NOTE — Progress Notes (Signed)
 Virtual visit with Robert [name] with Robert [name]. He states he feels good No fever. Medications administered. No c/o pain.Verified arm band placement. Answered questions concerning plans for tomorrow. Encouraged to call with any concerns.

## 2023-11-05 NOTE — Progress Notes (Signed)
 Hospital at Home Daily Progress Note   Patient: Robert [name]  MRN: [id number]  DOB: Apr 04, 1927  DOA: 11/02/2023  DOS: the patient was seen and examined on @TODAY @    Patient identified themself as Robert [name]  DOB [birth year]  Medic Norman Miner present at bedside during encounter and performed the physical exam. EMT Gaylan Hampshire also present and assisted with assessment. Patient was seen today via video chat; my physical location [place], [place]   Brief hospital course: HPI on admission to [facility] program 11/03/23: Patient is a 89 y.o.  male with history of HTN, HLD, A-fib on Coumadin , CAD s/p CABG, aortic stenosis-s/p aortic valve replacement, PPM implantation, chronic HFpEF-presented with RLE cellulitis.  Recently seen in urgent care-and started on doxycycline-unfortunately-continue to have persistent symptoms necessitating ED visit.  Subsequently admitted to the hospitalist service and started on IV antibiotics.  Pt reports mild improvement in symptoms overnight. Pt still reports mild redness and palpable tenderness. No subjective chills. Pt feels symptoms worsened after recent injection for squamous cell carcinoma. Does not report any alcohol, tobacco or illicit drug use. Performs all of his ADLs independently. Lives with his wife who is also ambulatory. Daughter lives 32ft from the patient's home on a similar property. Drives independently. Drives to [employer] for breakfast everyday per report.  Extensive discussion with patient as well as family at the bedside. The family is in  agreement for transfer into the hospital at home program.    Patient is being treated with IV antibiotics for right lower extremity cellulitis, after failing to improve on oral doxycycline previously in outpatient setting.  Further hospital course and management as outlined below.   Assessment and Plan:  RLE cellulitis Posterior right calf wound - likely triggered cellulitis Failed outpatient  antibiotics with noted recent course of oral doxycycline- worsening symptoms  9/2 - Some mild improvement overnight-however RLE is still inflamed-erythematous-tender and still significantly swollen. 9/3 - slight further improvement, remains with significant swelling, erythema, pain 9/4 - overall similar to yesterday, slow gradual improvement, persistent edema --Continue IV Rocephin , Linezolid  --Wound care RN consulted - appreciate wound care orders --Pain control PRN --Monitor clinically for improvement --Plan to continue IV antibiotics at least 24-48 more hours and further improvement prior to transition to PO --Consider ABI's if slow to improve, assess peripheral circulation    PAF - HR's controlled. Sinus rhythm on admission. --Continue digoxin /metoprolol  --INR therapeutic-warfarin per pharmacy   History of tachybradycardia syndrome S/p pacemaker 05/2020  HR currently stable  --Monitor     CAD s/p CABG 05/2013 - stable, no chest pain. --Continue statin, beta-blocker --Suspect not on ASA as patient on Coumadin    History of aortic stenosis-s/p aortic valve replacement --Supportive care   Chronic HFpEF - appears compensated, euvolemic 2D ECHO 10/2023 w/ EF 60-65%  --Monitor volume status    HTN -- BP stable --Continue metoprolol /Lasix    Hypokalemia - resolved with replacement --monitor BMP   Normocytic anemia Secondary to chronic disease No evidence of blood loss Trend CBC   History of prostate cancer Lupron  every 6 months.     Patient Active Problem List   Diagnosis Date Noted   Carcinoma of prostate (HCC) 11/02/2023   Wound infection 11/02/2023   S/P CABG x 1 08/12/2023   Chronic combined systolic and diastolic CHF (congestive heart failure) (HCC) 08/12/2023   Pleural effusion on right 04/24/2023   Subtherapeutic international normalized ratio (INR) 04/24/2023   GERD (gastroesophageal reflux disease) 04/24/2023   Anemia of chronic disease  04/24/2023    Pacemaker 09/06/2020   Tachycardia-bradycardia syndrome (HCC) 05/03/2020   Secondary hypercoagulable state (HCC) 04/04/2020   Mixed conductive and sensorineural hearing loss of both ears 09/14/2018   Encounter for therapeutic drug monitoring 06/02/2014   Chronic anticoagulation 05/17/2014   Atrial flutter (HCC) 06/08/2013   Persistent atrial fibrillation (HCC) 05/19/2013   S/P AVR 05/12/2013   Aortic stenosis    Coronary atherosclerosis of native coronary artery 04/21/2013   Essential hypertension 04/21/2013   HLD (hyperlipidemia) 04/21/2013   Reflux esophagitis 04/21/2013   Paroxysmal atrial fibrillation (HCC) 04/21/2013           Subjective: Pt seen virtually during Medic's visit this AM, daughter present during encounter.  Pt reports yesterday had some increased pain, maybe from compression of dressing. Feels better today with less constricting mepilex dressing.  No fever/chills.  He denies any shortness of breath.  We discussed yesterday evening low oxygen readings and it was determined an issue with wearable device.  Tyler checked ambulatory O2 sats during visit this AM and pt maintains in 90's on room air.  Pt and daughter feel comfortable with managing dressing changes after discharge.   Physical Exam:    11/05/2023   11:07 AM 11/05/2023    9:06 AM 11/04/2023    8:00 PM  Vitals with BMI  Weight 152 lbs 14 oz    BMI 27.09    Systolic  127 133  Diastolic  68 72  Pulse  67 69      General exam: awake, alert, no acute distress Respiratory system: CTAB, on room air, normal respiratory effort. Cardiovascular system: normal S1/S2, RRR, no murmurs Central nervous system: A&O x3. no gross focal neurologic deficits, normal speech Extremities: SEE IMAGES below - gradually improving erythema, persistent pitting edema Skin: warm, dry, right leg wound & erythema as shown below Psychiatry: normal mood, congruent affect, judgement and insight appear normal  PHOTOS TAKEN DURING AM  MEDIC VISIT TODAY:  (Squamous cell lesion below wound, uncovered)   (Ongoing pitting edema)   (Squamous cell lesion below wound, covered)      Data Reviewed:  Notable labs --   BMP normal except chloride 96 WBC 12.9 >> 13.0 (improved from initial 18.5) Hbg 9.5 >> 9.7 >> 9.8 stable INR 3.1   Family Communication: Daughter, Scarlet, present in the home during virtual encounter  Disposition: Status is: Inpatient Remains inpatient appropriate because: remains on IV antibiotics pending further clinical improvement   Planned Discharge Destination: Home    Time spent: 45 minutes  Author: Burnard DELENA Cunning, DO [facility]  10/22/2023 7:00 AM  For on call review [email or web address].

## 2023-11-06 DIAGNOSIS — S81801D Unspecified open wound, right lower leg, subsequent encounter: Secondary | ICD-10-CM

## 2023-11-06 LAB — CBC
HCT: 31.1 % — ABNORMAL LOW (ref 39.0–52.0)
Hemoglobin: 10.1 g/dL — ABNORMAL LOW (ref 13.0–17.0)
MCH: 31.9 pg (ref 26.0–34.0)
MCHC: 32.5 g/dL (ref 30.0–36.0)
MCV: 98.1 fL (ref 80.0–100.0)
Platelets: 260 K/uL (ref 150–400)
RBC: 3.17 MIL/uL — ABNORMAL LOW (ref 4.22–5.81)
RDW: 16.5 % — ABNORMAL HIGH (ref 11.5–15.5)
WBC: 13.2 K/uL — ABNORMAL HIGH (ref 4.0–10.5)
nRBC: 0 % (ref 0.0–0.2)

## 2023-11-06 LAB — BASIC METABOLIC PANEL WITH GFR
Anion gap: 12 (ref 5–15)
BUN: 25 mg/dL — ABNORMAL HIGH (ref 8–23)
CO2: 26 mmol/L (ref 22–32)
Calcium: 9.1 mg/dL (ref 8.9–10.3)
Chloride: 98 mmol/L (ref 98–111)
Creatinine, Ser: 1.16 mg/dL (ref 0.61–1.24)
GFR, Estimated: 58 mL/min — ABNORMAL LOW (ref >60)
Glucose, Bld: 92 mg/dL (ref 70–99)
Potassium: 3.7 mmol/L (ref 3.5–5.1)
Sodium: 136 mmol/L (ref 135–145)

## 2023-11-06 NOTE — Plan of Care (Signed)
  Problem: Clinical Measurements: Goal: Ability to maintain clinical measurements within normal limits will improve Outcome: Progressing   Problem: Clinical Measurements: Goal: Will remain free from infection Outcome: Progressing   Problem: Clinical Measurements: Goal: Diagnostic test results will improve Outcome: Progressing   Problem: Activity: Goal: Risk for activity intolerance will decrease Outcome: Progressing   Problem: Nutrition: Goal: Adequate nutrition will be maintained Outcome: Progressing   Problem: Safety: Goal: Ability to remain free from injury will improve Outcome: Progressing

## 2023-11-06 NOTE — Progress Notes (Signed)
 Pt seen for routine [facility] morning visit. Pt appears well and states he slept well. Pt ambulated from bathroom to recliner using walker on my arrival.   Vital signs and assessment obtained as noted.  Wound care performed and new photos of wound added to media tab. I cleansed the wound with sterile saline, pat dry, applied medi-honey to wound beds and covered with square mepilex.  Medications administered as noted. IV care performed and curos cap added. Labs drawn and transported by EMT.   Pt had virtual call with RN and Dr. Alvia. Pt's daughter, [name] and Pt's wife were both present for encounter.  Pt encouraged to call back with any problems.

## 2023-11-06 NOTE — Progress Notes (Signed)
 [id number] Virtual visit Medic, Provider, and Tax inspector. Current health BP 163/74, P 69, T .97.3 F oral. Patient and spouse report he is doing well daughter also present.. Labs sent. Patient denies pain and swelling is down +2  family shared he has swelling at baseline. Reports no issues with bowel and bladder. Provider discussed WBC trend and provider explain that should continue to trend downward we will continue to monitor today with possible d/c tomorrow. Patient requested tylenol  pain 1/10 by right leg wound and   wants to remain ahead of pain. Identification completed and patient with adult daughter self-administered tylenol . Will continue to monitor.SABRA

## 2023-11-06 NOTE — Progress Notes (Addendum)
 1942--Introductory contact call via phone. Patient identifiers reviewed.  Patient/caregiver (Spouse) reported patient doing well on RA, denies health concerns at this time.  Patient/caregiver (spouse) first refused video call then agreed. Patient and spouse confirmed PM medications already given with assistance of [facility] field staff. Pain level reported at 2-3, denied need for PRN pain medications at this time. Assessment completed. Encouraged to call if needed. [facility] contact information reviewed.    1958--Contact call via video call x2. Unable to reach patient/caregiver (spouse)   2000-Inbound call from patient spouse, unable to connect on tablet. Reported currently preparing for dinner request calls only to daughter for the remainder of the night. Declined additional call on the tablet. Confirmed no change to pain score. Patient and spouse informed of overnight plan and AM medications reviewed. Encouraged to call as needed or with changes to condition. [facility] contact information reviewed.

## 2023-11-06 NOTE — Progress Notes (Signed)
 Arrived to the patient for the PM visit.   Found the patient sitting upright in the recliner, located in the living room. The patient was cheerful and in no signs of acute distress. ABCs are intact. The patient is mentation is at baseline, Aox4.   Physical exam is unremarkable other than the outstanding redness and swelling of the right leg. No worsening or acute changes (photos).   Vitals taken, medication administered, with no other needs being required from the patient.   Of note, the patient voices a modest pain score of <4 but the wife was insistent its higher than reported. The only analgesia present at the time of the visit was norco. The patient was outside of pain parameters for this intervention.

## 2023-11-06 NOTE — Progress Notes (Signed)
 0830 Virtual call Patient was up walking with wife already cleaned up and dressed patient reports feeling very well. Completed identification refused to take Lasix  at scheduled time both patient and spouse have decreased sight and hearing prefer to take meds once medic arrives. Medic notified. Patient has Rocephin  scheduled and is slated for possible D/C 9/6.

## 2023-11-06 NOTE — Progress Notes (Signed)
 Hospital at home progress note    JAYMERE ALEN  FMW:[id number] DOB: Jul 12, 1927 DOA: 11/02/2023 PCP: [name], PA  Patient identified as a Gerre Bunker.  Date of birth 1927/07/08.  I was located in [place] . Patient was seen today via video chat.  Patient examined by Lauraine Faes.  Brief Narrative: Patient was admitted to the hospital 11/02/2023 and admitted to the hospital at home program on 11/03/2023.  89 y.o.  male with history of HTN, HLD, A-fib on Coumadin , CAD s/p CABG, aortic stenosis-s/p aortic valve replacement, PPM implantation, chronic HFpEF-presented with RLE cellulitis.  Recently seen in urgent care-and started on doxycycline-unfortunately-continue to have persistent symptoms necessitating ED visit.  Subsequently admitted to the hospitalist service and started on IV antibiotics.  Patient has a right posterior right lower extremity traumatic wound for the past 1 month.  He had just received 5-FU shot 4 days prior to admission to the squamous cell lesion just next to the wound by dermatology.  He has history of CLL and small lymphocytic lymphoma followed by Dr. Onesimo.  X-rays done in the ED shows generalized soft tissue swelling and advanced calcified peripheral vascular disease.    Patient is being treated with IV antibiotics for right lower extremity cellulitis, after failing to improve on oral doxycycline previously in outpatient setting.   Assessment & Plan:   Principal Problem:   Wound infection Active Problems:   Coronary atherosclerosis of native coronary artery   Essential hypertension   HLD (hyperlipidemia)   Reflux esophagitis   Paroxysmal atrial fibrillation (HCC)   Aortic stenosis   S/P AVR   Tachycardia-bradycardia syndrome (HCC)   Pacemaker   GERD (gastroesophageal reflux disease)   Anemia of chronic disease   S/P CABG x 1   Chronic combined systolic and diastolic CHF (congestive heart failure) (HCC)  RLE  cellulitis Posterior right calf wound - likely triggered cellulitis Failed outpatient antibiotics with noted recent course of oral doxycycline- worsening symptoms.  He reports improvement in his pain to the right lower extremity.  However the extremity still remains very swollen though with decreased erythema.  He does have chronic right lower extremity edema compared to left.  Currently being treated with IV Rocephin  and oral Zyvox .  Wound care was consulted recommended washing with normal saline and applying Medihoney to the traumatic wound. Consider CT of the right lower extremity if he fails to improve any further.Consider ABI's if slow to improve, assess peripheral circulation.  Encourage to keep his feet elevated while in bed. His white count is 13.2 from 13 from 12.9 from 12.7 from 18.5 on the day of admission to the hospital.    Media Information   Document Information  Photos    11/06/2023 09:33  Attached To:  Hospital Encounter on 11/02/23  Source Information  Faes Lauraine, Paramedic  [facility]     PAF - HR's controlled. Sinus rhythm on admission. --Continue digoxin /metoprolol  --INR therapeutic-warfarin per pharmacy   History of tachybradycardia syndrome S/p pacemaker 05/2020  HR currently stable  --Monitor     CAD s/p CABG 05/2013 - stable, no chest pain. --Continue statin, beta-blocker --Suspect not on ASA as patient on Coumadin    History of aortic stenosis-s/p aortic valve replacement --Supportive care   Chronic HFpEF - appears compensated, euvolemic 2D ECHO 10/2023 w/ EF 60-65%  --Monitor volume status    HTN -- BP stable --Continue metoprolol /Lasix    Hypokalemia - resolved with replacement --monitor BMP   Normocytic anemia Secondary to  chronic disease No evidence of blood loss Trend CBC   History of prostate cancer Lupron  every 6 months Followed at [facility].  Estimated body mass index is 27.09 kg/m as calculated from the following:    Height as of this encounter: 5' 3 (1.6 m).   Weight as of this encounter: 69.4 kg.  DVT prophylaxis: Coumadin   code Status: DNR  family Communication: Daughter daughter at bedside at home  disposition Plan:  Status is: Inpatient Remains inpatient appropriate because: Acute illness   Consultants:  Wound care  Procedures: None  Antimicrobials: Rocephin  and Zyvox   Subjective: He is sitting up in the recliner in no acute distress reports he slept well no nausea vomiting diarrhea pain in the right lower extremity is improved  Objective: Vitals:   11/05/23 1700 11/05/23 2030 11/05/23 2300 11/06/23 0900  BP: 126/67   (!) 163/74  Pulse: 65   69  Resp: 16   16  Temp: 100 F (37.8 C) 99.3 F (37.4 C)  (!) 97.3 F (36.3 C)  TempSrc: Oral Oral  Oral  SpO2: 96%  96% 98%  Weight:      Height:       No intake or output data in the 24 hours ending 11/06/23 1515 Filed Weights   11/02/23 1303 11/03/23 0500 11/05/23 1107  Weight: 71.7 kg 73.6 kg 69.4 kg    Examination: Examined by Lauraine faes  General exam: Appears in no acute distress Respiratory system: Clear to auscultation. Respiratory effort normal. Cardiovascular system: reg Gastrointestinal system: Abdomen is nondistended, soft and nontender. No organomegaly or masses felt. Normal bowel sounds heard. Central nervous system: Alert and oriented. No focal neurological deficits. Extremities: 2+ right lower extremity edema erythema right posterior wound with eschar and a tiny area of squamous cell carcinoma site below the wound noted see media   Data Reviewed: I have personally reviewed following labs and imaging studies  CBC: Recent Labs  Lab 11/02/23 1021 11/03/23 0541 11/04/23 1107 11/05/23 1011 11/06/23 0942  WBC 18.5* 12.7* 12.9* 13.0* 13.2*  HGB 10.2* 9.5* 9.7* 9.8* 10.1*  HCT 31.9* 28.7* 30.3* 30.2* 31.1*  MCV 99.1 96.6 99.3 98.4 98.1  PLT 217 185 220 227 260   Basic Metabolic Panel: Recent Labs  Lab  11/02/23 1021 11/03/23 0541 11/04/23 1107 11/05/23 1011 11/06/23 0942  NA 140 140 134* 137 136  K 3.5 3.4* 4.2 3.8 3.7  CL 97* 97* 98 96* 98  CO2 27 27 25 29 26   GLUCOSE 93 107* 123* 97 92  BUN 30* 25* 27* 22 25*  CREATININE 1.07 1.00 0.89 1.04 1.16  CALCIUM  9.4 9.3 9.3 9.0 9.1   GFR: Estimated Creatinine Clearance: 34.1 mL/min (by C-G formula based on SCr of 1.16 mg/dL). Liver Function Tests: Recent Labs  Lab 11/02/23 1021 11/03/23 0541  AST 24 19  ALT 15 14  ALKPHOS 32* 31*  BILITOT 1.1 0.8  PROT 6.8 6.4*  ALBUMIN  3.9 3.5   No results for input(s): LIPASE, AMYLASE in the last 168 hours. No results for input(s): AMMONIA in the last 168 hours. Coagulation Profile: Recent Labs  Lab 11/02/23 1021 11/03/23 0541 11/05/23 1011  INR 2.1* 2.5* 3.1*   Cardiac Enzymes: No results for input(s): CKTOTAL, CKMB, CKMBINDEX, TROPONINI in the last 168 hours. BNP (last 3 results) No results for input(s): PROBNP in the last 8760 hours. HbA1C: No results for input(s): HGBA1C in the last 72 hours. CBG: No results for input(s): GLUCAP in the last 168 hours.  Lipid Profile: No results for input(s): CHOL, HDL, LDLCALC, TRIG, CHOLHDL, LDLDIRECT in the last 72 hours. Thyroid  Function Tests: No results for input(s): TSH, T4TOTAL, FREET4, T3FREE, THYROIDAB in the last 72 hours. Anemia Panel: No results for input(s): VITAMINB12, FOLATE, FERRITIN, TIBC, IRON, RETICCTPCT in the last 72 hours. Sepsis Labs: Recent Labs  Lab 11/02/23 1031  LATICACIDVEN 1.3    Recent Results (from the past 240 hours)  Blood culture (routine x 2)     Status: None (Preliminary result)   Collection Time: 11/02/23 10:21 AM   Specimen: BLOOD  Result Value Ref Range Status   Specimen Description BLOOD RIGHT ANTECUBITAL  Final   Special Requests   Final    BOTTLES DRAWN AEROBIC AND ANAEROBIC Blood Culture adequate volume   Culture   Final    NO GROWTH 4  DAYS Performed at [facility], [address] [place], [place] [address]    Report Status PENDING  Incomplete  Blood culture (routine x 2)     Status: None (Preliminary result)   Collection Time: 11/02/23 10:22 AM   Specimen: BLOOD  Result Value Ref Range Status   Specimen Description BLOOD LEFT ANTECUBITAL  Final   Special Requests   Final    BOTTLES DRAWN AEROBIC AND ANAEROBIC Blood Culture adequate volume   Culture   Final    NO GROWTH 4 DAYS Performed at [facility], [address] [place], [place] [address]    Report Status PENDING  Incomplete  MRSA Next Gen by PCR, Nasal     Status: None   Collection Time: 11/02/23 12:40 PM   Specimen: Nasal Mucosa; Nasal Swab  Result Value Ref Range Status   MRSA by PCR Next Gen NOT DETECTED NOT DETECTED Final    Comment: (NOTE) The GeneXpert MRSA Assay (FDA approved for NASAL specimens only), is one component of a comprehensive MRSA colonization surveillance program. It is not intended to diagnose MRSA infection nor to guide or monitor treatment for MRSA infections. Test performance is not FDA approved in patients less than 63 years old. Performed at [facility], [address], [place], [place] [address]     Radiology Studies: No results found.   Scheduled Meds:  digoxin   0.125 mg Oral QPM   furosemide   80 mg Oral BID   leptospermum manuka honey  1 Application Topical Daily   linezolid   600 mg Oral Q12H   metoprolol  succinate  50 mg Oral Daily   pantoprazole   40 mg Oral Daily   warfarin  5 mg Oral q1600   Warfarin - Pharmacist Dosing Inpatient   Does not apply q1600   Continuous Infusions:  cefTRIAXone  (ROCEPHIN )  IV Stopped (11/05/23 1850)     LOS: 3 days    Almarie KANDICE Hoots, MD 11/06/2023, 3:15 PM

## 2023-11-06 NOTE — Progress Notes (Signed)
 [address]5 [address] up for O2 Sat called home phone [phone], cel, and virtual none answered. Called daughter scarlet at [phone], spoke to her she is sitting on front porch with parents. Reports her father is fine just wanted some fresh air. Denies any sign of distress heard the couple talking in the background. Will continue to monitor.

## 2023-11-06 NOTE — Plan of Care (Signed)
 Patient alert and oriented increasing tolerance for walking. Reports feeling well and chest wall pain left is still resolving. Will continue to monitor.  Problem: Education: Goal: Knowledge of General Education information will improve Description: Including pain rating scale, medication(s)/side effects and non-pharmacologic comfort measures Outcome: Progressing   Problem: Clinical Measurements: Goal: Ability to maintain clinical measurements within normal limits will improve Outcome: Progressing Goal: Will remain free from infection Outcome: Progressing   Problem: Nutrition: Goal: Adequate nutrition will be maintained Outcome: Progressing   Problem: Clinical Measurements: Goal: Ability to avoid or minimize complications of infection will improve Outcome: Progressing

## 2023-11-06 NOTE — Progress Notes (Signed)
 2307--Patient alerting for Hypoxia and previous intermittent Tachypnea for resp rate. Daughter called per patient/spouse request. Daughter confirmed known ongoing issues and will continue to monitor and contact with concerns. Caregiver (daughter) encouraged to call with changes in condition/concerns. Patient will continue to be monitored.

## 2023-11-07 DIAGNOSIS — Z66 Do not resuscitate: Secondary | ICD-10-CM

## 2023-11-07 DIAGNOSIS — I4821 Permanent atrial fibrillation: Secondary | ICD-10-CM

## 2023-11-07 DIAGNOSIS — L03115 Cellulitis of right lower limb: Principal | ICD-10-CM

## 2023-11-07 LAB — CBC
HCT: 30.5 % — ABNORMAL LOW (ref 39.0–52.0)
Hemoglobin: 9.8 g/dL — ABNORMAL LOW (ref 13.0–17.0)
MCH: 31.9 pg (ref 26.0–34.0)
MCHC: 32.1 g/dL (ref 30.0–36.0)
MCV: 99.3 fL (ref 80.0–100.0)
Platelets: 242 K/uL (ref 150–400)
RBC: 3.07 MIL/uL — ABNORMAL LOW (ref 4.22–5.81)
RDW: 16.6 % — ABNORMAL HIGH (ref 11.5–15.5)
WBC: 12.8 K/uL — ABNORMAL HIGH (ref 4.0–10.5)
nRBC: 0 % (ref 0.0–0.2)

## 2023-11-07 LAB — HEMOGLOBIN A1C
Hgb A1c MFr Bld: 5.8 % — ABNORMAL HIGH (ref 4.8–5.6)
Mean Plasma Glucose: 119.76 mg/dL

## 2023-11-07 LAB — CULTURE, BLOOD (ROUTINE X 2)
Culture: NO GROWTH
Culture: NO GROWTH
Special Requests: ADEQUATE
Special Requests: ADEQUATE

## 2023-11-07 LAB — C-REACTIVE PROTEIN: CRP: 2.8 mg/dL — ABNORMAL HIGH (ref <1.0)

## 2023-11-07 LAB — PROTIME-INR
INR: 2.3 — ABNORMAL HIGH (ref 0.8–1.2)
Prothrombin Time: 26.8 s — ABNORMAL HIGH (ref 11.4–15.2)

## 2023-11-07 LAB — SEDIMENTATION RATE: Sed Rate: 63 mm/h — ABNORMAL HIGH (ref 0–16)

## 2023-11-07 MED ORDER — LINEZOLID 600 MG PO TABS: 600.0000 mg | ORAL_TABLET | Freq: Two times a day (BID) | ORAL | 0 refills | Status: AC

## 2023-11-07 MED ORDER — PROBIOTIC ACIDOPHILUS PO CAPS: 1.0000 | ORAL_CAPSULE | Freq: Two times a day (BID) | ORAL | 0 refills | Status: AC

## 2023-11-07 MED ORDER — CEFADROXIL 500 MG PO CAPS: 500.0000 mg | ORAL_CAPSULE | Freq: Two times a day (BID) | ORAL | 0 refills | Status: AC

## 2023-11-07 MED ORDER — MEDIHONEY WOUND/BURN DRESSING EX PSTE: 1.0000 | PASTE | Freq: Every day | CUTANEOUS | 0 refills | Status: AC

## 2023-11-07 NOTE — Assessment & Plan Note (Addendum)
 11-07-2023 discussed with pt and his dtr [name] Parentau. Pt and dtr both verify that DNR/DNI status was changed after discussion with Dr. Eldonna on 11-03-2023.  Pt already has DNR form at home.

## 2023-11-07 NOTE — Progress Notes (Signed)
 Hospital at [facility] PROGRESS NOTE    Robert [name]  FMW:[id number] DOB: [birth year] DOA: 11/02/2023 PCP: [name], PA  Subjective: Pt seen via video session. Pt examined by paramedic Sarah.  Dtr [name] Derry present for video meeting as well as patient.  Pt still with edema of right leg. This is chronic.  Dtr states pt sustained a laceration to the right posterior calf around the beginning of July 2025. Pt states he cut his calf in the night with his toenail of his left foot.  Initial injury was smaller but pt did not clean it or notify his family that he had sustained the original injury.   Provider Location: [place], [place] Patient examined by: Sarah(paramedic) Patient identified as Robert [name] and date of birth as 07/30/1927.  Hospital Course: CC: right leg wound HPI: Robert [name] is a 89 y.o. male with medical history significant of hypertension, hyperlipidemia, GERD, CAD status post CABG, atrial fibrillation, tachybradycardia syndrome, status post pacemaker, aortic stenosis status post AVR, chronic combined systolic and diastolic CHF, prostate cancer, anemia presenting with worsening wound infection.   Patient has been dealing with a wound to his right calf for about a month.  He was seen in urgent care 2 weeks ago with concern for infection and was prescribed 7 days of doxycycline which she did complete.   4 days ago he followed up with dermatologist.  The wound was gently cleaned and he was advised to use antibiotic ointment.  He did have an injection performed for a squamous cell carcinoma near the site of the wound at that time because they did not want to biopsy it given its proximity to the wound.   Now for the past 4 days he has had worsening pain, swelling, erythema, warmth, tenderness and drainage from the wound.   Denies fevers, chills, chest pain, shortness of breath, abdominal pain, constipation, diarrhea, nausea, vomiting.   ED  Course: Vital signs in the ED notable for blood pressure in the 150s systolic.   Lab workup included CMP with chloride 97, normal bicarb, gap 16, BUN 30, alk phos 32.  CBC with leukocytosis to 18.5, hemoglobin stable at 10.2.  Lactic acid normal.  ESR and CRP pending.  Blood cultures pending.   Right tib-fib x-ray pending.   Patient received oxycodone , ceftriaxone , vancomycin  in the ED.  Significant Events: Admitted 11/02/2023 for right leg cellulitis 11-03-2023 transferred to [facility] program  Admission Labs: WBC 18.5, HgB 10.2, Plt 217 Na 140, K 3.5, CO2 of 27, BUN 30, Scr 1.07, glu 93, T. Prot 6.8, alb 3.9, AST 24, ALT 15, alk phos 32, t. Bili 1.1 ESR 65 CRP 7.6 Lactic acid 1.3 MRSA screen negative  Admission Imaging Studies: XR Right tib/fib Generalized soft tissue swelling. Advanced calcified peripheral vascular disease. No acute osseous abnormality identified in the right tib-fib.  Significant Labs:   Significant Imaging Studies:   Antibiotic Therapy: Anti-infectives (From admission, onward)    Start     Dose/Rate Route Frequency Ordered Stop   11/05/23 2000  linezolid  (ZYVOX ) tablet 600 mg        600 mg Oral Every 12 hours 11/05/23 1618     11/03/23 1200  vancomycin  (VANCOREADY) IVPB 750 mg/150 mL  Status:  Discontinued        750 mg 150 mL/hr over 60 Minutes Intravenous Every 24 hours 11/02/23 1133 11/03/23 0945   11/03/23 1200  cefTRIAXone  (ROCEPHIN ) 1 g in sodium chloride  0.9 % 100 mL IVPB  1 g 200 mL/hr over 30 Minutes Intravenous Every 24 hours 11/03/23 1048     11/03/23 1145  linezolid  (ZYVOX ) IVPB 600 mg  Status:  Discontinued        600 mg 300 mL/hr over 60 Minutes Intravenous Every 12 hours 11/03/23 1048 11/05/23 1618   11/02/23 1130  vancomycin  (VANCOREADY) IVPB 1500 mg/300 mL        1,500 mg 150 mL/hr over 120 Minutes Intravenous  Once 11/02/23 1126 11/02/23 1645   11/02/23 1115  cefTRIAXone  (ROCEPHIN ) 1 g in sodium chloride  0.9 % 100 mL IVPB         1 g 200 mL/hr over 30 Minutes Intravenous  Once 11/02/23 1109 11/02/23 1301       Procedures:   Consultants:     Assessment and Plan: * Cellulitis of right leg 11-02-2023   11-03-2023   11-04-2023   11-05-2023   11-06-2023      11-07-2023        11-03-2023 Failed outpatient antibiotics.  Some mild improvement overnight-however RLE is still inflamed-erythematous-tender and still significantly swollen.  He does have a small-dry open wound in the posterior aspect of his calf-likely portal of entry. Continue IV antibiotics-till he is RLE swelling/erythema/leukocytosis much better. Follow cultures-but negative so far.  Transfer to [facility] program for continued IV ABX.  11-04-2023 slight further improvement, remains with significant swelling, erythema, pain --Continue IV Rocephin , Linezolid  --Wound care RN consulted - appreciate wound care orders --Pain control PRN --Monitor clinically for improvement --Plan to continue IV antibiotics at least 48 more hours and/or significant further improvement prior to transition to PO --Consider ABI's if slow to improve, would assess peripheral circulation  11-05-2023 overall similar to yesterday, slow gradual improvement, persistent edema --Continue IV Rocephin , Linezolid  --Wound care RN consulted - appreciate wound care orders --Pain control PRN --Monitor clinically for improvement --Plan to continue IV antibiotics at least 24-48 more hours and further improvement prior to transition to PO --Consider ABI's if slow to improve, assess peripheral circulation  11-07-2023  Dtr states pt sustained a laceration to the right posterior calf around the beginning of July 2025. Pt states he cut his calf in the night with his toenail of his left foot.  Initial injury was smaller but pt did not clean it or notify his family that he had sustained the original injury.  Right leg edema is chronic. He normally takes metolazone  2.5 mg once a week in  addition to his lasix  80 mg bid. Dtr states pt calls his metolazoneo his Mega Pill because it makes him urinate about 25% more.  If his labs are stable and pt is able to be released from hospital-at-home program, will have pt take an extra dose of Metolazone  tomorrow and then again on Monday. He would need to have his labs(BMP, Mg, CBC with diff) checked in PCP office on Wednesday or Thursday(September 10 or 11) to make sure renal function is stable.  Plan for 3 more days of abx therapy. If discharged today, will change rocephin  over to duricef 500 mg bid x 3 days and continue oral zyvox  600 mg bid x 3 days. Pt is having some diarrhea. Doubt c.diff. will limit to only 3 more days of po abx. Start probiotic 1 capsule bid x 10 days at discharge. I offered to refer pt to wound care clinic or orthopedic clinic(Dr. Duda's office) but dtr declined referral. She would prefer home health RN come and check on his wound. Dtr states she is  comfortable performing wound care and bandage changes. She just would like an RN to monitor his wound to make sure it continues to heal.  Based on today's pictures, the wound is healing nicely and should resolve with continue treatment.  Blood cx are negative at 5 days(final). Pt was not septicemic.  Chronic combined systolic and diastolic CHF (congestive heart failure) (HCC) 11-07-2023. Last echo 05-2023 showed LVEF 60 to 65%. On lasix  80 mg bid, toprol -xL 50 mg daily.  Will give extra dose of metolazone  2.5 mg tomorrow(Sunday). Pt will take his routine dose on Monday.  He would need to have his labs(BMP, Mg, CBC with diff) checked in PCP office on Wednesday or Thursday(September 10 or 11) to make sure renal function is stable   Permanent atrial fibrillation (HCC) - . Left atrial appendage is clipped. 11-07-2023 In rate controlled afib on admission. On Toprol -XL and digoxin  for rate control Coumadin  for CVA prophylaxis Coumadin  dosing per pharmacy  In looking back at his prior echos  and his TEE in 04-2020, it appears he has had his left atrial appendage clipped. He also has a tissue aortic valve replacement. Discussed with pt and his dtr [employer]. She states patient decided he wanted to stay on his systemic anticoagulation with coumadin  until he got to speak with his cardiologist Dr. Santo about whether he needs to continue coumadin  or stop it.  Patient wants to talk face-to-face with his cardiologist about this issues.  DNR (do not resuscitate)/DNI(Do Not Intubate) 11-07-2023 discussed with pt and his dtr [name] Parentau. Pt and dtr both verify that DNR/DNI status was changed after discussion with Dr. Eldonna on 11-03-2023.  Pt already has DNR form at home.     S/P CABG x 1 Stable. CABG in 2015 at time of tissue AVR.  Anemia of chronic disease HgB stable at 10.1 g/dl yesterday.  GERD (gastroesophageal reflux disease) On protonix .  Pacemaker Chronic, stable  S/P AVR - tissue AVR 05/2013 Stable. F/u with cardiology as outpatient  HLD (hyperlipidemia) On Crestor .  Essential hypertension On lasix , toprol -XL. BP controlled.  Coronary atherosclerosis of native coronary artery On Toprol -XL and crestor . Does not need ASA. On Coumadin .   DVT prophylaxis:  warfarin (COUMADIN ) tablet 5 mg     Code Status: Limited: Do not attempt resuscitation (DNR) -DNR-LIMITED -Do Not Intubate/DNI  Family Communication: discussed with pt and dtr [name] Parenteau during video call Disposition Plan: home Reason for continuing need for hospitalization: give IV Rocehin this AM. May be able to be discharge this afternoon is CRP/ESR are decreasing and WBC is stable.  Objective: Vitals:   11/07/23 0416 11/07/23 0611 11/07/23 0646 11/07/23 0900  BP:    138/74  Pulse: 61 71 65 68  Resp: 17 19 (!) 25   Temp:    98.1 F (36.7 C)  TempSrc:    Oral  SpO2: 95% 99% 99% 98%  Weight:      Height:       No intake or output data in the 24 hours ending 11/07/23 1053 Filed  Weights   11/02/23 1303 11/03/23 0500 11/05/23 1107  Weight: 71.7 kg 73.6 kg 69.4 kg    Examination: Note that physical examination performed by paramedic on-scene and documented by provider Video Exam performed by video enabled technology  Physical Exam Vitals and nursing note reviewed.  Constitutional:      General: He is not in acute distress.    Appearance: He is not toxic-appearing.  HENT:     Head:  Normocephalic and atraumatic.  Pulmonary:     Effort: Pulmonary effort is normal.     Breath sounds: Normal breath sounds.  Abdominal:     General: Abdomen is flat.  Skin:    Comments: See picture of right leg wound  Neurological:     Mental Status: He is alert and oriented to person, place, and time.     Comments: Hard of hearing        Data Reviewed: I have personally reviewed following labs and imaging studies  CBC: Recent Labs  Lab 11/02/23 1021 11/03/23 0541 11/04/23 1107 11/05/23 1011 11/06/23 0942  WBC 18.5* 12.7* 12.9* 13.0* 13.2*  HGB 10.2* 9.5* 9.7* 9.8* 10.1*  HCT 31.9* 28.7* 30.3* 30.2* 31.1*  MCV 99.1 96.6 99.3 98.4 98.1  PLT 217 185 220 227 260   Basic Metabolic Panel: Recent Labs  Lab 11/02/23 1021 11/03/23 0541 11/04/23 1107 11/05/23 1011 11/06/23 0942  NA 140 140 134* 137 136  K 3.5 3.4* 4.2 3.8 3.7  CL 97* 97* 98 96* 98  CO2 27 27 25 29 26   GLUCOSE 93 107* 123* 97 92  BUN 30* 25* 27* 22 25*  CREATININE 1.07 1.00 0.89 1.04 1.16  CALCIUM  9.4 9.3 9.3 9.0 9.1   GFR: Estimated Creatinine Clearance: 34.1 mL/min (by C-G formula based on SCr of 1.16 mg/dL). Liver Function Tests: Recent Labs  Lab 11/02/23 1021 11/03/23 0541  AST 24 19  ALT 15 14  ALKPHOS 32* 31*  BILITOT 1.1 0.8  PROT 6.8 6.4*  ALBUMIN  3.9 3.5   Coagulation Profile: Recent Labs  Lab 11/02/23 1021 11/03/23 0541 11/05/23 1011  INR 2.1* 2.5* 3.1*   BNP (last 3 results) Recent Labs    04/25/23 0533 08/20/23 1139  BNP 802.0* 781.0*   Sepsis Labs: Recent  Labs  Lab 11/02/23 1031  LATICACIDVEN 1.3    Recent Results (from the past 240 hours)  Blood culture (routine x 2)     Status: None   Collection Time: 11/02/23 10:21 AM   Specimen: BLOOD  Result Value Ref Range Status   Specimen Description BLOOD RIGHT ANTECUBITAL  Final   Special Requests   Final    BOTTLES DRAWN AEROBIC AND ANAEROBIC Blood Culture adequate volume   Culture   Final    NO GROWTH 5 DAYS Performed at [facility], [address] [place], [place] [address]    Report Status 11/07/2023 FINAL  Final  Blood culture (routine x 2)     Status: None   Collection Time: 11/02/23 10:22 AM   Specimen: BLOOD  Result Value Ref Range Status   Specimen Description BLOOD LEFT ANTECUBITAL  Final   Special Requests   Final    BOTTLES DRAWN AEROBIC AND ANAEROBIC Blood Culture adequate volume   Culture   Final    NO GROWTH 5 DAYS Performed at [facility], [address], [place], [place] [address]    Report Status 11/07/2023 FINAL  Final  MRSA Next Gen by PCR, Nasal     Status: None   Collection Time: 11/02/23 12:40 PM   Specimen: Nasal Mucosa; Nasal Swab  Result Value Ref Range Status   MRSA by PCR Next Gen NOT DETECTED NOT DETECTED Final    Comment: (NOTE) The GeneXpert MRSA Assay (FDA approved for NASAL specimens only), is one component of a comprehensive MRSA colonization surveillance program. It is not intended to diagnose MRSA infection nor to guide or monitor treatment for MRSA infections.  Test performance is not FDA approved in patients less than 1 years old. Performed at [facility], [address] [place], [place] [address]      Scheduled Meds:  digoxin   0.125 mg Oral QPM   furosemide   80 mg Oral BID   leptospermum manuka honey  1 Application Topical Daily   linezolid   600 mg Oral Q12H   metoprolol  succinate  50 mg Oral Daily   pantoprazole   40 mg Oral Daily   warfarin  5 mg Oral q1600   Warfarin - Pharmacist Dosing Inpatient   Does not  apply q1600   Continuous Infusions:  cefTRIAXone  (ROCEPHIN )  IV 1 g (11/07/23 0945)     LOS: 4 days   Time spent: 60 minutes  Camellia Door, DO  [facility]  11/07/2023, 10:53 AM

## 2023-11-07 NOTE — Assessment & Plan Note (Signed)
 Chronic, stable

## 2023-11-07 NOTE — Assessment & Plan Note (Addendum)
 11-02-2023   11-03-2023   11-04-2023   11-05-2023   11-06-2023      11-07-2023        11-03-2023 Failed outpatient antibiotics.  Some mild improvement overnight-however RLE is still inflamed-erythematous-tender and still significantly swollen.  He does have a small-dry open wound in the posterior aspect of his calf-likely portal of entry. Continue IV antibiotics-till he is RLE swelling/erythema/leukocytosis much better. Follow cultures-but negative so far.  Transfer to [facility] program for continued IV ABX.  11-04-2023 slight further improvement, remains with significant swelling, erythema, pain --Continue IV Rocephin , Linezolid  --Wound care RN consulted - appreciate wound care orders --Pain control PRN --Monitor clinically for improvement --Plan to continue IV antibiotics at least 48 more hours and/or significant further improvement prior to transition to PO --Consider ABI's if slow to improve, would assess peripheral circulation  11-05-2023 overall similar to yesterday, slow gradual improvement, persistent edema --Continue IV Rocephin , Linezolid  --Wound care RN consulted - appreciate wound care orders --Pain control PRN --Monitor clinically for improvement --Plan to continue IV antibiotics at least 24-48 more hours and further improvement prior to transition to PO --Consider ABI's if slow to improve, assess peripheral circulation  11-07-2023  Dtr states pt sustained a laceration to the right posterior calf around the beginning of July 2025. Pt states he cut his calf in the night with his toenail of his left foot.  Initial injury was smaller but pt did not clean it or notify his family that he had sustained the original injury.  Right leg edema is chronic. He normally takes metolazone  2.5 mg once a week in addition to his lasix  80 mg bid. Dtr states pt calls his metolazoneo his Mega Pill because it makes him urinate about 25% more.  If his labs are stable and pt is able to be released from  hospital-at-home program, will have pt take an extra dose of Metolazone  tomorrow and then again on Monday. He would need to have his labs(BMP, Mg, CBC with diff) checked in PCP office on Wednesday or Thursday(September 10 or 11) to make sure renal function is stable.  Plan for 3 more days of abx therapy. If discharged today, will change rocephin  over to duricef 500 mg bid x 3 days and continue oral zyvox  600 mg bid x 3 days. Pt is having some diarrhea. Doubt c.diff. will limit to only 3 more days of po abx. Start probiotic 1 capsule bid x 10 days at discharge. I offered to refer pt to wound care clinic or orthopedic clinic(Dr. Duda's office) but dtr declined referral. She would prefer home health RN come and check on his wound. Dtr states she is comfortable performing wound care and bandage changes. She just would like an RN to monitor his wound to make sure it continues to heal.  Based on today's pictures, the wound is healing nicely and should resolve with continue treatment.  Blood cx are negative at 5 days(final). Pt was not septicemic.  **update.  CRP is 2.8 today. Was 7.6 on admission. ESR is stable. May take some more time for it to come down as wound is healing. Pt is stable for discharge today. WBC stable at 12.8.  Inflammatory Markers: Lab Results  Component Value Date/Time   ESRSEDRATE 63 (H) 11/07/2023 10:38 AM   CRP 2.8 (H) 11/07/2023 10:38 AM

## 2023-11-07 NOTE — TOC Progression Note (Addendum)
 Transition of Care [facility]) - Progression Note    Patient Details  Name: Robert [name] MRN: [id number] Date of Birth: 20-Jun-1927  [facility] CM/SW Contact  Corean JAYSON Canary, RN Phone Number: 11/07/2023, 12:12 PM  Clinical Narrative:    Discussed in ID rounds, patient agreeable to home health. MD ordered RN for wound care and PT. [facility] agency that works best with insurance [employer] called and accepted they can likely see him Monday Made sure team aware.  No other needs  possible discharge tomorrow 1600 Best number to reach is Scarlet  [phone]/ made sure Channing from [facility] was aware.  Expected Discharge Plan: Home w Home Health Services Barriers to Discharge: Continued Medical Work up               Expected Discharge Plan and Services   Discharge Planning Services: CM Consult Post Acute Care Choice: Home Health Living arrangements for the past 2 months: Single Family Home                           HH Arranged: RN, PT [facility] Agency: [facility] Date [facility] Agency Contacted: 11/07/23 Time HH Agency Contacted: 1212 Representative spoke with at [facility] Agency: Channing Ee   Social Drivers of Health (SDOH) Interventions SDOH Screenings   Food Insecurity: No Food Insecurity (11/03/2023)  Housing: Unknown (11/03/2023)  Transportation Needs: No Transportation Needs (11/02/2023)  Utilities: Not At Risk (11/02/2023)  Social Connections: Moderately Isolated (11/02/2023)  Tobacco Use: Low Risk  (11/02/2023)    Readmission Risk Interventions     No data to display

## 2023-11-07 NOTE — Assessment & Plan Note (Signed)
 Stable. CABG in 2015 at time of tissue AVR.

## 2023-11-07 NOTE — Hospital Course (Addendum)
 CC: right leg wound HPI: Robert [name] is a 89 y.o. male with medical history significant of hypertension, hyperlipidemia, GERD, CAD status post CABG, atrial fibrillation, tachybradycardia syndrome, status post pacemaker, aortic stenosis status post AVR, chronic combined systolic and diastolic CHF, prostate cancer, anemia presenting with worsening wound infection.   Patient has been dealing with a wound to his right calf for about a month.  He was seen in urgent care 2 weeks ago with concern for infection and was prescribed 7 days of doxycycline which she did complete.   4 days ago he followed up with dermatologist.  The wound was gently cleaned and he was advised to use antibiotic ointment.  He did have an injection performed for a squamous cell carcinoma near the site of the wound at that time because they did not want to biopsy it given its proximity to the wound.   Now for the past 4 days he has had worsening pain, swelling, erythema, warmth, tenderness and drainage from the wound.   Denies fevers, chills, chest pain, shortness of breath, abdominal pain, constipation, diarrhea, nausea, vomiting.   ED Course: Vital signs in the ED notable for blood pressure in the 150s systolic.   Lab workup included CMP with chloride 97, normal bicarb, gap 16, BUN 30, alk phos 32.  CBC with leukocytosis to 18.5, hemoglobin stable at 10.2.  Lactic acid normal.  ESR and CRP pending.  Blood cultures pending.   Right tib-fib x-ray pending.   Patient received oxycodone , ceftriaxone , vancomycin  in the ED.  Significant Events: Admitted 11/02/2023 for right leg cellulitis 11-03-2023 transferred to [facility] program  Admission Labs: WBC 18.5, HgB 10.2, Plt 217 Na 140, K 3.5, CO2 of 27, BUN 30, Scr 1.07, glu 93, T. Prot 6.8, alb 3.9, AST 24, ALT 15, alk phos 32, t. Bili 1.1 ESR 65 CRP 7.6 Lactic acid 1.3 MRSA screen negative  Admission Imaging Studies: XR Right tib/fib Generalized soft tissue  swelling. Advanced calcified peripheral vascular disease. No acute osseous abnormality identified in the right tib-fib.  Significant Labs: Discharge ESR 63 Discharge CRP 2.8  Significant Imaging Studies:   Antibiotic Therapy: Anti-infectives (From admission, onward)    Start     Dose/Rate Route Frequency Ordered Stop   11/05/23 2000  linezolid  (ZYVOX ) tablet 600 mg        600 mg Oral Every 12 hours 11/05/23 1618     11/03/23 1200  vancomycin  (VANCOREADY) IVPB 750 mg/150 mL  Status:  Discontinued        750 mg 150 mL/hr over 60 Minutes Intravenous Every 24 hours 11/02/23 1133 11/03/23 0945   11/03/23 1200  cefTRIAXone  (ROCEPHIN ) 1 g in sodium chloride  0.9 % 100 mL IVPB        1 g 200 mL/hr over 30 Minutes Intravenous Every 24 hours 11/03/23 1048     11/03/23 1145  linezolid  (ZYVOX ) IVPB 600 mg  Status:  Discontinued        600 mg 300 mL/hr over 60 Minutes Intravenous Every 12 hours 11/03/23 1048 11/05/23 1618   11/02/23 1130  vancomycin  (VANCOREADY) IVPB 1500 mg/300 mL        1,500 mg 150 mL/hr over 120 Minutes Intravenous  Once 11/02/23 1126 11/02/23 1645   11/02/23 1115  cefTRIAXone  (ROCEPHIN ) 1 g in sodium chloride  0.9 % 100 mL IVPB        1 g 200 mL/hr over 30 Minutes Intravenous  Once 11/02/23 1109 11/02/23 1301  Procedures:   Consultants:

## 2023-11-07 NOTE — Subjective & Objective (Addendum)
 Pt seen via video session. Pt examined by paramedic Sarah.  Dtr [name] Derry present for video meeting as well as patient.  Pt still with edema of right leg. This is chronic.  Dtr states pt sustained a laceration to the right posterior calf around the beginning of July 2025. Pt states he cut his calf in the night with his toenail of his left foot.  Initial injury was smaller but pt did not clean it or notify his family that he had sustained the original injury.

## 2023-11-07 NOTE — Plan of Care (Signed)
  Problem: Education: Goal: Knowledge of General Education information will improve Description: Including pain rating scale, medication(s)/side effects and non-pharmacologic comfort measures Outcome: Completed/Met   Problem: Health Behavior/Discharge Planning: Goal: Ability to manage health-related needs will improve Outcome: Completed/Met   Problem: Clinical Measurements: Goal: Ability to maintain clinical measurements within normal limits will improve Outcome: Completed/Met Goal: Will remain free from infection Outcome: Completed/Met Goal: Diagnostic test results will improve Outcome: Completed/Met Goal: Respiratory complications will improve Outcome: Completed/Met Goal: Cardiovascular complication will be avoided Outcome: Completed/Met   Problem: Activity: Goal: Risk for activity intolerance will decrease Outcome: Completed/Met   Problem: Nutrition: Goal: Adequate nutrition will be maintained Outcome: Completed/Met   Problem: Coping: Goal: Level of anxiety will decrease Outcome: Completed/Met   Problem: Elimination: Goal: Will not experience complications related to bowel motility Outcome: Completed/Met Goal: Will not experience complications related to urinary retention Outcome: Completed/Met   Problem: Pain Managment: Goal: General experience of comfort will improve and/or be controlled Outcome: Completed/Met   Problem: Safety: Goal: Ability to remain free from injury will improve Outcome: Completed/Met   Problem: Skin Integrity: Goal: Risk for impaired skin integrity will decrease Outcome: Completed/Met   Problem: Clinical Measurements: Goal: Ability to avoid or minimize complications of infection will improve Outcome: Completed/Met   Problem: Skin Integrity: Goal: Skin integrity will improve Outcome: Completed/Met

## 2023-11-07 NOTE — Progress Notes (Signed)
 Completed virtual rounds with MD,paramedic at patient bedside. POC reviewed and discussed ,patient voices understanding and agreement. Pt reminded to call RN for any needs, RN and MD available at all times. Pt voices understanding. Pt aware of next planned visit and next call from RN.    Pt's daughter [name] at bedside and states she has changed her mind and does want [facility] for patients wound care.   Pt and daughter aware of possible discharge the afternoon pending lab results.

## 2023-11-07 NOTE — Progress Notes (Addendum)
 Pt seen for routine Hath morning visit. Pt appears well and was ambulating with walker to recliner upon my arrival. Pt states he slept fairly well last night and states he has slight lower back pain. Pt state he thinks the pain is from picking his leg up for wound care a few days ago. Pt is wearing a back brace and states it is helping. Tylenol  administered as well, as noted.  Wound care performed and new photos taken and uploaded to media tab. I applied a small square of telfa dressing over the squamous cell site and applied medi-honey to wound bed and covere with square mepilex.  Vital signs and assessment performed as noted.  Medications administered as noted. IV care performed and curos cap added to IV. Labs drawn by EMT, Faith.  Pt had virtual visit with RN and Dr. Laurence. Pt's daughters and wife present for encounter.  Per Dr. Laurence will plan for afternoon discharge pending labs. Pt encouraged to call back with any problems.   New IMM letter signed at 10:15 am. Copy of IMM letter left with Pt, other copy placed in shadow chart.

## 2023-11-07 NOTE — Assessment & Plan Note (Signed)
 On Toprol -XL and crestor . Does not need ASA. On Coumadin .

## 2023-11-07 NOTE — Assessment & Plan Note (Signed)
 On Crestor 

## 2023-11-07 NOTE — Assessment & Plan Note (Signed)
 HgB stable at 10.1 g/dl yesterday.

## 2023-11-07 NOTE — Assessment & Plan Note (Addendum)
 11-07-2023. Last echo 05-2023 showed LVEF 60 to 65%. On lasix  80 mg bid, toprol -xL 50 mg daily.  Will give extra dose of metolazone  2.5 mg tomorrow(Sunday). Pt will take his routine dose on Monday.  He would need to have his labs(BMP, Mg, CBC with diff) checked in PCP office on Wednesday or Thursday(September 10 or 11) to make sure renal function is stable

## 2023-11-07 NOTE — Assessment & Plan Note (Addendum)
 11-07-2023 In rate controlled afib on admission. On Toprol -XL and digoxin  for rate control Coumadin  for CVA prophylaxis Coumadin  dosing per pharmacy  In looking back at his prior echos and his TEE in 04-2020, it appears he has had his left atrial appendage clipped. He also has a tissue aortic valve replacement. Discussed with pt and his dtr [employer]. She states patient decided he wanted to stay on his systemic anticoagulation with coumadin  until he got to speak with his cardiologist Dr. Santo about whether he needs to continue coumadin  or stop it.  Patient wants to talk face-to-face with his cardiologist about this issues.  *INR is therapeutic at 2.3 on day of discharge. Resume home coumadin  dosing schedule.

## 2023-11-07 NOTE — Assessment & Plan Note (Signed)
 Stable. F/u with cardiology as outpatient

## 2023-11-07 NOTE — Assessment & Plan Note (Signed)
On protonix

## 2023-11-07 NOTE — Discharge Summary (Signed)
 [facility] Physician Discharge Summary   Patient name: Robert [name]  Admit date:     11/02/2023  Discharge date: 11/07/2023  Attending Physician: RAENELLE DONALDA HERO [[id number]]  Discharge Physician: Camellia Door   PCP: [name], PA  Admitted From: Home  Disposition:  Home  Recommendations for Outpatient Follow-up:  Follow up with PCP in 1-2 weeks Will need repeat CBC with diff, BMP, Mg in PCP office this coming week September 10 or 11th to monitor renal function.  Home Health:Yes. PT/RN Equipment/Devices: None  Discharge Condition:Stable CODE STATUS:DNR/DNI Diet recommendation: Heart Healthy Fluid Restriction: None  Hospital Summary: CC: right leg wound HPI: Robert [name] is a 89 y.o. male with medical history significant of hypertension, hyperlipidemia, GERD, CAD status post CABG, atrial fibrillation, tachybradycardia syndrome, status post pacemaker, aortic stenosis status post AVR, chronic combined systolic and diastolic CHF, prostate cancer, anemia presenting with worsening wound infection.   Patient has been dealing with a wound to his right calf for about a month.  He was seen in urgent care 2 weeks ago with concern for infection and was prescribed 7 days of doxycycline which she did complete.   4 days ago he followed up with dermatologist.  The wound was gently cleaned and he was advised to use antibiotic ointment.  He did have an injection performed for a squamous cell carcinoma near the site of the wound at that time because they did not want to biopsy it given its proximity to the wound.   Now for the past 4 days he has had worsening pain, swelling, erythema, warmth, tenderness and drainage from the wound.   Denies fevers, chills, chest pain, shortness of breath, abdominal pain, constipation, diarrhea, nausea, vomiting.   ED Course: Vital signs in the ED notable for blood pressure in the 150s systolic.   Lab workup included CMP with chloride  97, normal bicarb, gap 16, BUN 30, alk phos 32.  CBC with leukocytosis to 18.5, hemoglobin stable at 10.2.  Lactic acid normal.  ESR and CRP pending.  Blood cultures pending.   Right tib-fib x-ray pending.   Patient received oxycodone , ceftriaxone , vancomycin  in the ED.  Significant Events: Admitted 11/02/2023 for right leg cellulitis 11-03-2023 transferred to [facility] program  Admission Labs: WBC 18.5, HgB 10.2, Plt 217 Na 140, K 3.5, CO2 of 27, BUN 30, Scr 1.07, glu 93, T. Prot 6.8, alb 3.9, AST 24, ALT 15, alk phos 32, t. Bili 1.1 ESR 65 CRP 7.6 Lactic acid 1.3 MRSA screen negative  Admission Imaging Studies: XR Right tib/fib Generalized soft tissue swelling. Advanced calcified peripheral vascular disease. No acute osseous abnormality identified in the right tib-fib.  Significant Labs: Discharge ESR 63 Discharge CRP 2.8  Significant Imaging Studies:   Antibiotic Therapy: Anti-infectives (From admission, onward)    Start     Dose/Rate Route Frequency Ordered Stop   11/05/23 2000  linezolid  (ZYVOX ) tablet 600 mg        600 mg Oral Every 12 hours 11/05/23 1618     11/03/23 1200  vancomycin  (VANCOREADY) IVPB 750 mg/150 mL  Status:  Discontinued        750 mg 150 mL/hr over 60 Minutes Intravenous Every 24 hours 11/02/23 1133 11/03/23 0945   11/03/23 1200  cefTRIAXone  (ROCEPHIN ) 1 g in sodium chloride  0.9 % 100 mL IVPB        1 g 200 mL/hr over 30 Minutes Intravenous Every 24 hours 11/03/23 1048     11/03/23 1145  linezolid  (ZYVOX ) IVPB 600 mg  Status:  Discontinued        600 mg 300 mL/hr over 60 Minutes Intravenous Every 12 hours 11/03/23 1048 11/05/23 1618   11/02/23 1130  vancomycin  (VANCOREADY) IVPB 1500 mg/300 mL        1,500 mg 150 mL/hr over 120 Minutes Intravenous  Once 11/02/23 1126 11/02/23 1645   11/02/23 1115  cefTRIAXone  (ROCEPHIN ) 1 g in sodium chloride  0.9 % 100 mL IVPB        1 g 200 mL/hr over 30 Minutes Intravenous  Once 11/02/23 1109 11/02/23 1301        Procedures:   Consultants:    Hospital Course by Problem: * Cellulitis of right leg 11-02-2023   11-03-2023   11-04-2023   11-05-2023   11-06-2023      11-07-2023        11-03-2023 Failed outpatient antibiotics.  Some mild improvement overnight-however RLE is still inflamed-erythematous-tender and still significantly swollen.  He does have a small-dry open wound in the posterior aspect of his calf-likely portal of entry. Continue IV antibiotics-till he is RLE swelling/erythema/leukocytosis much better. Follow cultures-but negative so far.  Transfer to [facility] program for continued IV ABX.  11-04-2023 slight further improvement, remains with significant swelling, erythema, pain --Continue IV Rocephin , Linezolid  --Wound care RN consulted - appreciate wound care orders --Pain control PRN --Monitor clinically for improvement --Plan to continue IV antibiotics at least 48 more hours and/or significant further improvement prior to transition to PO --Consider ABI's if slow to improve, would assess peripheral circulation  11-05-2023 overall similar to yesterday, slow gradual improvement, persistent edema --Continue IV Rocephin , Linezolid  --Wound care RN consulted - appreciate wound care orders --Pain control PRN --Monitor clinically for improvement --Plan to continue IV antibiotics at least 24-48 more hours and further improvement prior to transition to PO --Consider ABI's if slow to improve, assess peripheral circulation  11-07-2023  Dtr states pt sustained a laceration to the right posterior calf around the beginning of July 2025. Pt states he cut his calf in the night with his toenail of his left foot.  Initial injury was smaller but pt did not clean it or notify his family that he had sustained the original injury.  Right leg edema is chronic. He normally takes metolazone  2.5 mg once a week in addition to his lasix  80 mg bid. Dtr states pt calls his metolazoneo his Mega Pill  because it makes him urinate about 25% more.  If his labs are stable and pt is able to be released from hospital-at-home program, will have pt take an extra dose of Metolazone  tomorrow and then again on Monday. He would need to have his labs(BMP, Mg, CBC with diff) checked in PCP office on Wednesday or Thursday(September 10 or 11) to make sure renal function is stable.  Plan for 3 more days of abx therapy. If discharged today, will change rocephin  over to duricef 500 mg bid x 3 days and continue oral zyvox  600 mg bid x 3 days. Pt is having some diarrhea. Doubt c.diff. will limit to only 3 more days of po abx. Start probiotic 1 capsule bid x 10 days at discharge. I offered to refer pt to wound care clinic or orthopedic clinic(Dr. Duda's office) but dtr declined referral. She would prefer home health RN come and check on his wound. Dtr states she is comfortable performing wound care and bandage changes. She just would like an RN to monitor his wound to make sure  it continues to heal.  Based on today's pictures, the wound is healing nicely and should resolve with continue treatment.  Blood cx are negative at 5 days(final). Pt was not septicemic.  **update.  CRP is 2.8 today. Was 7.6 on admission. ESR is stable. May take some more time for it to come down as wound is healing. Pt is stable for discharge today. WBC stable at 12.8.  Inflammatory Markers: Lab Results  Component Value Date/Time   ESRSEDRATE 63 (H) 11/07/2023 10:38 AM   CRP 2.8 (H) 11/07/2023 10:38 AM     Chronic combined systolic and diastolic CHF (congestive heart failure) (HCC) 11-07-2023. Last echo 05-2023 showed LVEF 60 to 65%. On lasix  80 mg bid, toprol -xL 50 mg daily.  Will give extra dose of metolazone  2.5 mg tomorrow(Sunday). Pt will take his routine dose on Monday.  He would need to have his labs(BMP, Mg, CBC with diff) checked in PCP office on Wednesday or Thursday(September 10 or 11) to make sure renal function is stable   Permanent  atrial fibrillation (HCC) - . Left atrial appendage is clipped. 11-07-2023 In rate controlled afib on admission. On Toprol -XL and digoxin  for rate control Coumadin  for CVA prophylaxis Coumadin  dosing per pharmacy  In looking back at his prior echos and his TEE in 04-2020, it appears he has had his left atrial appendage clipped. He also has a tissue aortic valve replacement. Discussed with pt and his dtr [employer]. She states patient decided he wanted to stay on his systemic anticoagulation with coumadin  until he got to speak with his cardiologist Dr. Santo about whether he needs to continue coumadin  or stop it.  Patient wants to talk face-to-face with his cardiologist about this issues.  *INR is therapeutic at 2.3 on day of discharge. Resume home coumadin  dosing schedule.  DNR (do not resuscitate)/DNI(Do Not Intubate) 11-07-2023 discussed with pt and his dtr [name] Parentau. Pt and dtr both verify that DNR/DNI status was changed after discussion with Dr. Eldonna on 11-03-2023.  Pt already has DNR form at home.     S/P CABG x 1 Stable. CABG in 2015 at time of tissue AVR.  Anemia of chronic disease HgB stable at 10.1 g/dl yesterday.  GERD (gastroesophageal reflux disease) On protonix .  Pacemaker Chronic, stable  S/P AVR - tissue AVR 05/2013 Stable. F/u with cardiology as outpatient  HLD (hyperlipidemia) On Crestor .  Essential hypertension On lasix , toprol -XL. BP controlled.  Coronary atherosclerosis of native coronary artery On Toprol -XL and crestor . Does not need ASA. On Coumadin .    Discharge Diagnoses:  Principal Problem:   Cellulitis of right leg Active Problems:   Permanent atrial fibrillation (HCC) - . Left atrial appendage is clipped.   Chronic combined systolic and diastolic CHF (congestive heart failure) (HCC)   Coronary atherosclerosis of native coronary artery   Essential hypertension   HLD (hyperlipidemia)   S/P AVR - tissue AVR 05/2013    Pacemaker   GERD (gastroesophageal reflux disease)   Anemia of chronic disease   S/P CABG x 1   DNR (do not resuscitate)/DNI(Do Not Intubate)   Discharge Instructions  Discharge Instructions     Call MD for:  difficulty breathing, headache or visual disturbances   Complete by: As directed    Call MD for:  extreme fatigue   Complete by: As directed    Call MD for:  hives   Complete by: As directed    Call MD for:  persistant dizziness or light-headedness   Complete by:  As directed    Call MD for:  persistant nausea and vomiting   Complete by: As directed    Call MD for:  redness, tenderness, or signs of infection (pain, swelling, redness, odor or green/yellow discharge around incision site)   Complete by: As directed    Call MD for:  severe uncontrolled pain   Complete by: As directed    Call MD for:  temperature >100.4   Complete by: As directed    Diet - low sodium heart healthy   Complete by: As directed    Discharge instructions   Complete by: As directed    1. Follow up with your primary care provider in 1-2 weeks following discharge from hospital. 2. Obtain blood work from PCP office next week. Wednesday or Thursday September 10 or 11th.(CBC, BMP, Mg) 3. Continue with daily dressing changes to the wound   Discharge wound care:   Complete by: As directed    Cleanse trauma wound to right posterior leg with NS and pat dry Apply medihoney to wound bed to facilitate debridement. Cover with dry dressing. Change daily. Apply thin layer (3 mm) to wound.   Increase activity slowly   Complete by: As directed       Allergies as of 11/07/2023       Reactions   Amoxicillin Swelling   penile swelling   Lisinopril Cough   Kenalog  [triamcinolone ] Rash        Medication List     STOP taking these medications    doxycycline 100 MG capsule Commonly known as: VIBRAMYCIN   Klor-Con  M20 20 MEQ tablet Generic drug: potassium chloride  SA   rosuvastatin  20 MG  tablet Commonly known as: CRESTOR        TAKE these medications    acetaminophen  325 MG tablet Commonly known as: TYLENOL  Take 650 mg by mouth every 6 (six) hours as needed for moderate pain.   cefadroxil  500 MG capsule Commonly known as: DURICEF Take 1 capsule (500 mg total) by mouth 2 (two) times daily for 3 days. Start taking on: November 08, 2023   Dialyvite Vitamin D 5000 125 MCG (5000 UT) capsule Generic drug: Cholecalciferol  Take 5,000 Units by mouth 2 (two) times a week.   digoxin  0.125 MG tablet Commonly known as: LANOXIN  Take 1 tablet (0.125 mg total) by mouth every evening.   furosemide  80 MG tablet Commonly known as: LASIX  Take 1 tablet (80 mg total) by mouth 2 (two) times daily.   leptospermum manuka honey Pste paste Apply 1 Application topically daily for 7 days. Interchangeable with TheraHoney  Cleanse trauma wound to right posterior leg with NS and pat dry Apply medihoney to wound bed to facilitate debridement. Cover with dry dressing. Change daily. Apply thin layer (3 mm) to wound.   linezolid  600 MG tablet Commonly known as: ZYVOX  Take 1 tablet (600 mg total) by mouth every 12 (twelve) hours for 3 days.   metolazone  2.5 MG tablet Commonly known as: ZAROXOLYN  Take 1 tablet (2.5 mg total) by mouth once a week. Notes to patient: Take one dose today and one dose on Monday   metoprolol  succinate 50 MG 24 hr tablet Commonly known as: TOPROL -XL Take 1 tablet (50 mg total) by mouth daily. Take with or immediately following a meal.   mupirocin  ointment 2 % Commonly known as: BACTROBAN  Apply 1 Application topically 2 (two) times daily.   pantoprazole  40 MG tablet Commonly known as: PROTONIX  Take 40 mg by mouth daily.   Probiotic  Acidophilus Caps Take 1 capsule by mouth in the morning and at bedtime for 10 days.   VITAMIN C PO Take 1 tablet by mouth daily.   warfarin 5 MG tablet Commonly known as: COUMADIN  Take as directed. If you are unsure how to  take this medication, talk to your nurse or doctor. Original instructions: TAKE 1 TABLET DAILY AS DIRECTED BY THE COUMADIN  CLINIC. What changed: See the new instructions.       ASK your doctor about these medications    HYDROcodone -acetaminophen  5-325 MG tablet Commonly known as: NORCO/VICODIN Take 1 tablet by mouth every 6 (six) hours as needed for up to 2 days for moderate pain (pain score 4-6) or severe pain (pain score 7-10). Ask about: Should I take this medication?               Discharge Care Instructions  (From admission, onward)           Start     Ordered   11/07/23 0000  Discharge wound care:       Comments: Cleanse trauma wound to right posterior leg with NS and pat dry Apply medihoney to wound bed to facilitate debridement. Cover with dry dressing. Change daily. Apply thin layer (3 mm) to wound.   11/07/23 1100            Follow-up Information     Care, [facility] Follow up.   Why: Channing rose [phone] liasion, she is wonderful about follow up and addressing any question.  She will reach out to you  and home health could start Monday Contact information: [address] [place] [address] [phone]                Allergies  Allergen Reactions   Amoxicillin Swelling    penile swelling   Lisinopril Cough   Kenalog  [Triamcinolone ] Rash    Discharge Exam: Vitals:   11/07/23 0646 11/07/23 0900  BP:  138/74  Pulse: 65 68  Resp: (!) 25   Temp:  98.1 F (36.7 C)  SpO2: 99% 98%    Physical Exam Vitals and nursing note reviewed.  Constitutional:      General: He is not in acute distress.    Appearance: He is not toxic-appearing.  HENT:     Head: Normocephalic and atraumatic.  Pulmonary:     Effort: Pulmonary effort is normal.     Breath sounds: Normal breath sounds.  Abdominal:     General: Abdomen is flat.  Skin:    Comments: See picture of right leg wound  Neurological:     Mental Status: He is  alert and oriented to person, place, and time.     Comments: Hard of hearing        The results of significant diagnostics from this hospitalization (including imaging, microbiology, ancillary and laboratory) are listed below for reference.    Microbiology: Recent Results (from the past 240 hours)  Blood culture (routine x 2)     Status: None   Collection Time: 11/02/23 10:21 AM   Specimen: BLOOD  Result Value Ref Range Status   Specimen Description BLOOD RIGHT ANTECUBITAL  Final   Special Requests   Final    BOTTLES DRAWN AEROBIC AND ANAEROBIC Blood Culture adequate volume   Culture   Final    NO GROWTH 5 DAYS Performed at [facility], [address] [place], [place] [address]    Report Status 11/07/2023 FINAL  Final  Blood  culture (routine x 2)     Status: None   Collection Time: 11/02/23 10:22 AM   Specimen: BLOOD  Result Value Ref Range Status   Specimen Description BLOOD LEFT ANTECUBITAL  Final   Special Requests   Final    BOTTLES DRAWN AEROBIC AND ANAEROBIC Blood Culture adequate volume   Culture   Final    NO GROWTH 5 DAYS Performed at [facility], [address] [place], [place] [address]    Report Status 11/07/2023 FINAL  Final  MRSA Next Gen by PCR, Nasal     Status: None   Collection Time: 11/02/23 12:40 PM   Specimen: Nasal Mucosa; Nasal Swab  Result Value Ref Range Status   MRSA by PCR Next Gen NOT DETECTED NOT DETECTED Final    Comment: (NOTE) The GeneXpert MRSA Assay (FDA approved for NASAL specimens only), is one component of a comprehensive MRSA colonization surveillance program. It is not intended to diagnose MRSA infection nor to guide or monitor treatment for MRSA infections. Test performance is not FDA approved in patients less than 70 years old. Performed at [facility], [address], [place], [place] [address]     Labs: BNP (last 3 results) Recent Labs    04/25/23 0533 08/20/23 1139  BNP 802.0* 781.0*   Basic  Metabolic Panel: Recent Labs  Lab 11/02/23 1021 11/03/23 0541 11/04/23 1107 11/05/23 1011 11/06/23 0942  NA 140 140 134* 137 136  K 3.5 3.4* 4.2 3.8 3.7  CL 97* 97* 98 96* 98  CO2 27 27 25 29 26   GLUCOSE 93 107* 123* 97 92  BUN 30* 25* 27* 22 25*  CREATININE 1.07 1.00 0.89 1.04 1.16  CALCIUM  9.4 9.3 9.3 9.0 9.1   Liver Function Tests: Recent Labs  Lab 11/02/23 1021 11/03/23 0541  AST 24 19  ALT 15 14  ALKPHOS 32* 31*  BILITOT 1.1 0.8  PROT 6.8 6.4*  ALBUMIN  3.9 3.5   CBC: Recent Labs  Lab 11/03/23 0541 11/04/23 1107 11/05/23 1011 11/06/23 0942 11/07/23 1038  WBC 12.7* 12.9* 13.0* 13.2* 12.8*  HGB 9.5* 9.7* 9.8* 10.1* 9.8*  HCT 28.7* 30.3* 30.2* 31.1* 30.5*  MCV 96.6 99.3 98.4 98.1 99.3  PLT 185 220 227 260 242   Hgb A1c Recent Labs    11/07/23 1038  HGBA1C 5.8*   Sepsis Labs Recent Labs  Lab 11/04/23 1107 11/05/23 1011 11/06/23 0942 11/07/23 1038  WBC 12.9* 13.0* 13.2* 12.8*   Procedures/Studies: DG Tibia/Fibula Right Result Date: 11/02/2023 CLINICAL DATA:  89 year old male with wound infection. EXAM: RIGHT TIBIA AND FIBULA - 2 VIEW COMPARISON:  None Available. FINDINGS: Four views. Advanced calcified peripheral vascular disease. Generalized soft tissue swelling and stranding. Maintained alignment at the right knee and ankle. No evidence of knee joint effusion. Right tibia and fibula appear intact. Partially visible possible neuropathic bony changes in the foot. But no acute osseous abnormality identified. IMPRESSION: Generalized soft tissue swelling. Advanced calcified peripheral vascular disease. No acute osseous abnormality identified in the right tib-fib. Electronically Signed   By: VEAR Hurst M.D.   On: 11/02/2023 12:55    Time coordinating discharge: 60 mins  SIGNED:  Camellia Door, DO [facility] 11/07/23, 1:04 PM

## 2023-11-07 NOTE — Assessment & Plan Note (Signed)
 On lasix , toprol -XL. BP controlled.

## 2023-11-10 ENCOUNTER — Ambulatory Visit: Attending: Physician Assistant

## 2023-11-10 ENCOUNTER — Other Ambulatory Visit: Payer: Self-pay

## 2023-11-10 DIAGNOSIS — I480 Paroxysmal atrial fibrillation: Secondary | ICD-10-CM | POA: Insufficient documentation

## 2023-11-10 DIAGNOSIS — Z5181 Encounter for therapeutic drug level monitoring: Secondary | ICD-10-CM | POA: Insufficient documentation

## 2023-11-10 DIAGNOSIS — I4821 Permanent atrial fibrillation: Secondary | ICD-10-CM | POA: Insufficient documentation

## 2023-11-10 LAB — POCT INR: INR: 3 (ref 2.0–3.0)

## 2023-11-10 NOTE — Patient Instructions (Signed)
 continue taking warfarin 1 tablet daily.  Recheck INR in 6 weeks.  Stay consistent with Ensures (2 times per week)  Call if scheduled for any procedures or has any new medications  [phone]

## 2023-11-11 MED ORDER — METOLAZONE 2.5 MG PO TABS: 2.5000 mg | ORAL_TABLET | ORAL | 2 refills | Status: AC

## 2023-11-12 DIAGNOSIS — Z7901 Long term (current) use of anticoagulants: Secondary | ICD-10-CM | POA: Diagnosis not present

## 2023-11-12 DIAGNOSIS — I110 Hypertensive heart disease with heart failure: Secondary | ICD-10-CM | POA: Diagnosis not present

## 2023-11-12 DIAGNOSIS — I739 Peripheral vascular disease, unspecified: Secondary | ICD-10-CM | POA: Diagnosis not present

## 2023-11-12 DIAGNOSIS — K219 Gastro-esophageal reflux disease without esophagitis: Secondary | ICD-10-CM | POA: Diagnosis not present

## 2023-11-12 DIAGNOSIS — H9190 Unspecified hearing loss, unspecified ear: Secondary | ICD-10-CM | POA: Diagnosis not present

## 2023-11-12 DIAGNOSIS — Z950 Presence of cardiac pacemaker: Secondary | ICD-10-CM | POA: Diagnosis not present

## 2023-11-12 DIAGNOSIS — I2510 Atherosclerotic heart disease of native coronary artery without angina pectoris: Secondary | ICD-10-CM | POA: Diagnosis not present

## 2023-11-12 DIAGNOSIS — I4821 Permanent atrial fibrillation: Secondary | ICD-10-CM | POA: Diagnosis not present

## 2023-11-12 DIAGNOSIS — Z951 Presence of aortocoronary bypass graft: Secondary | ICD-10-CM | POA: Diagnosis not present

## 2023-11-12 DIAGNOSIS — S81811D Laceration without foreign body, right lower leg, subsequent encounter: Secondary | ICD-10-CM | POA: Diagnosis not present

## 2023-11-12 DIAGNOSIS — D649 Anemia, unspecified: Secondary | ICD-10-CM | POA: Diagnosis not present

## 2023-11-12 DIAGNOSIS — I5042 Chronic combined systolic (congestive) and diastolic (congestive) heart failure: Secondary | ICD-10-CM | POA: Diagnosis not present

## 2023-11-12 DIAGNOSIS — I495 Sick sinus syndrome: Secondary | ICD-10-CM | POA: Diagnosis not present

## 2023-11-12 DIAGNOSIS — C44722 Squamous cell carcinoma of skin of right lower limb, including hip: Secondary | ICD-10-CM | POA: Diagnosis not present

## 2023-11-12 DIAGNOSIS — Z952 Presence of prosthetic heart valve: Secondary | ICD-10-CM | POA: Diagnosis not present

## 2023-11-12 DIAGNOSIS — E785 Hyperlipidemia, unspecified: Secondary | ICD-10-CM | POA: Diagnosis not present

## 2023-11-13 DIAGNOSIS — I110 Hypertensive heart disease with heart failure: Secondary | ICD-10-CM | POA: Diagnosis not present

## 2023-11-13 DIAGNOSIS — I5042 Chronic combined systolic (congestive) and diastolic (congestive) heart failure: Secondary | ICD-10-CM | POA: Diagnosis not present

## 2023-11-13 DIAGNOSIS — I4821 Permanent atrial fibrillation: Secondary | ICD-10-CM | POA: Diagnosis not present

## 2023-11-13 DIAGNOSIS — S81811D Laceration without foreign body, right lower leg, subsequent encounter: Secondary | ICD-10-CM | POA: Diagnosis not present

## 2023-11-13 DIAGNOSIS — C44722 Squamous cell carcinoma of skin of right lower limb, including hip: Secondary | ICD-10-CM | POA: Diagnosis not present

## 2023-11-13 DIAGNOSIS — I739 Peripheral vascular disease, unspecified: Secondary | ICD-10-CM | POA: Diagnosis not present

## 2023-11-17 ENCOUNTER — Ambulatory Visit (INDEPENDENT_AMBULATORY_CARE_PROVIDER_SITE_OTHER): Payer: Medicare Other

## 2023-11-17 DIAGNOSIS — I480 Paroxysmal atrial fibrillation: Secondary | ICD-10-CM | POA: Diagnosis not present

## 2023-11-17 DIAGNOSIS — I739 Peripheral vascular disease, unspecified: Secondary | ICD-10-CM | POA: Diagnosis not present

## 2023-11-17 DIAGNOSIS — C44722 Squamous cell carcinoma of skin of right lower limb, including hip: Secondary | ICD-10-CM | POA: Diagnosis not present

## 2023-11-17 DIAGNOSIS — I110 Hypertensive heart disease with heart failure: Secondary | ICD-10-CM | POA: Diagnosis not present

## 2023-11-17 DIAGNOSIS — I5042 Chronic combined systolic (congestive) and diastolic (congestive) heart failure: Secondary | ICD-10-CM | POA: Diagnosis not present

## 2023-11-17 DIAGNOSIS — S81811D Laceration without foreign body, right lower leg, subsequent encounter: Secondary | ICD-10-CM | POA: Diagnosis not present

## 2023-11-17 DIAGNOSIS — I4821 Permanent atrial fibrillation: Secondary | ICD-10-CM | POA: Diagnosis not present

## 2023-11-18 LAB — CUP PACEART REMOTE DEVICE CHECK
Battery Remaining Longevity: 94 mo
Battery Remaining Percentage: 75 %
Battery Voltage: 3.04 V
Brady Statistic RV Percent Paced: 53 %
Date Time Interrogation Session: 20250916020015
Implantable Lead Connection Status: 753985
Implantable Lead Connection Status: 753985
Implantable Lead Implant Date: 20220321
Implantable Lead Implant Date: 20220321
Implantable Lead Location: 753859
Implantable Lead Location: 753860
Implantable Pulse Generator Implant Date: 20220321
Lead Channel Impedance Value: 400 Ohm
Lead Channel Pacing Threshold Amplitude: 0.75 V
Lead Channel Pacing Threshold Pulse Width: 0.4 ms
Lead Channel Sensing Intrinsic Amplitude: 7.5 mV
Lead Channel Setting Pacing Amplitude: 2.5 V
Lead Channel Setting Pacing Pulse Width: 0.4 ms
Lead Channel Setting Sensing Sensitivity: 2 mV
Pulse Gen Model: 2272
Pulse Gen Serial Number: 3909458

## 2023-11-19 DIAGNOSIS — S81801D Unspecified open wound, right lower leg, subsequent encounter: Secondary | ICD-10-CM | POA: Diagnosis not present

## 2023-11-19 DIAGNOSIS — I5042 Chronic combined systolic (congestive) and diastolic (congestive) heart failure: Secondary | ICD-10-CM | POA: Diagnosis not present

## 2023-11-19 DIAGNOSIS — I739 Peripheral vascular disease, unspecified: Secondary | ICD-10-CM | POA: Diagnosis not present

## 2023-11-19 DIAGNOSIS — I4821 Permanent atrial fibrillation: Secondary | ICD-10-CM | POA: Diagnosis not present

## 2023-11-19 DIAGNOSIS — C44722 Squamous cell carcinoma of skin of right lower limb, including hip: Secondary | ICD-10-CM | POA: Diagnosis not present

## 2023-11-19 DIAGNOSIS — S81811D Laceration without foreign body, right lower leg, subsequent encounter: Secondary | ICD-10-CM | POA: Diagnosis not present

## 2023-11-19 DIAGNOSIS — I110 Hypertensive heart disease with heart failure: Secondary | ICD-10-CM | POA: Diagnosis not present

## 2023-11-19 DIAGNOSIS — L0390 Cellulitis, unspecified: Secondary | ICD-10-CM | POA: Diagnosis not present

## 2023-11-20 ENCOUNTER — Inpatient Hospital Stay (HOSPITAL_COMMUNITY)
Admission: EM | Admit: 2023-11-20 | Discharge: 2023-11-29 | DRG: 603 | Disposition: A | Discharge status: Home Health | Attending: Internal Medicine | Admitting: Internal Medicine

## 2023-11-20 ENCOUNTER — Encounter (HOSPITAL_COMMUNITY): Payer: Self-pay

## 2023-11-20 ENCOUNTER — Other Ambulatory Visit: Payer: Self-pay

## 2023-11-20 ENCOUNTER — Ambulatory Visit: Payer: Self-pay | Admitting: Internal Medicine

## 2023-11-20 DIAGNOSIS — I10 Essential (primary) hypertension: Secondary | ICD-10-CM | POA: Diagnosis present

## 2023-11-20 DIAGNOSIS — I890 Lymphedema, not elsewhere classified: Secondary | ICD-10-CM | POA: Diagnosis present

## 2023-11-20 DIAGNOSIS — L03115 Cellulitis of right lower limb: Principal | ICD-10-CM | POA: Diagnosis present

## 2023-11-20 DIAGNOSIS — Z66 Do not resuscitate: Secondary | ICD-10-CM | POA: Diagnosis present

## 2023-11-20 DIAGNOSIS — L0390 Cellulitis, unspecified: Secondary | ICD-10-CM

## 2023-11-20 DIAGNOSIS — D72829 Elevated white blood cell count, unspecified: Secondary | ICD-10-CM | POA: Diagnosis not present

## 2023-11-20 DIAGNOSIS — Z881 Allergy status to other antibiotic agents status: Secondary | ICD-10-CM | POA: Diagnosis not present

## 2023-11-20 DIAGNOSIS — L97918 Non-pressure chronic ulcer of unspecified part of right lower leg with other specified severity: Secondary | ICD-10-CM | POA: Diagnosis not present

## 2023-11-20 DIAGNOSIS — Z951 Presence of aortocoronary bypass graft: Secondary | ICD-10-CM

## 2023-11-20 DIAGNOSIS — K219 Gastro-esophageal reflux disease without esophagitis: Secondary | ICD-10-CM | POA: Diagnosis present

## 2023-11-20 DIAGNOSIS — Z888 Allergy status to other drugs, medicaments and biological substances status: Secondary | ICD-10-CM

## 2023-11-20 DIAGNOSIS — Z952 Presence of prosthetic heart valve: Secondary | ICD-10-CM

## 2023-11-20 DIAGNOSIS — Z79899 Other long term (current) drug therapy: Secondary | ICD-10-CM | POA: Diagnosis not present

## 2023-11-20 DIAGNOSIS — I110 Hypertensive heart disease with heart failure: Secondary | ICD-10-CM | POA: Diagnosis present

## 2023-11-20 DIAGNOSIS — L089 Local infection of the skin and subcutaneous tissue, unspecified: Secondary | ICD-10-CM | POA: Diagnosis not present

## 2023-11-20 DIAGNOSIS — I272 Pulmonary hypertension, unspecified: Secondary | ICD-10-CM | POA: Diagnosis present

## 2023-11-20 DIAGNOSIS — Z803 Family history of malignant neoplasm of breast: Secondary | ICD-10-CM

## 2023-11-20 DIAGNOSIS — Z885 Allergy status to narcotic agent status: Secondary | ICD-10-CM | POA: Diagnosis not present

## 2023-11-20 DIAGNOSIS — E785 Hyperlipidemia, unspecified: Secondary | ICD-10-CM | POA: Diagnosis present

## 2023-11-20 DIAGNOSIS — I480 Paroxysmal atrial fibrillation: Secondary | ICD-10-CM | POA: Diagnosis not present

## 2023-11-20 DIAGNOSIS — Z841 Family history of disorders of kidney and ureter: Secondary | ICD-10-CM

## 2023-11-20 DIAGNOSIS — I4821 Permanent atrial fibrillation: Secondary | ICD-10-CM | POA: Diagnosis present

## 2023-11-20 DIAGNOSIS — M353 Polymyalgia rheumatica: Secondary | ICD-10-CM | POA: Diagnosis present

## 2023-11-20 DIAGNOSIS — Z823 Family history of stroke: Secondary | ICD-10-CM

## 2023-11-20 DIAGNOSIS — D649 Anemia, unspecified: Secondary | ICD-10-CM | POA: Diagnosis present

## 2023-11-20 DIAGNOSIS — L89151 Pressure ulcer of sacral region, stage 1: Secondary | ICD-10-CM | POA: Diagnosis present

## 2023-11-20 DIAGNOSIS — S41111A Laceration without foreign body of right upper arm, initial encounter: Secondary | ICD-10-CM | POA: Diagnosis present

## 2023-11-20 DIAGNOSIS — I5042 Chronic combined systolic (congestive) and diastolic (congestive) heart failure: Secondary | ICD-10-CM | POA: Diagnosis present

## 2023-11-20 DIAGNOSIS — L97919 Non-pressure chronic ulcer of unspecified part of right lower leg with unspecified severity: Secondary | ICD-10-CM | POA: Diagnosis present

## 2023-11-20 DIAGNOSIS — E559 Vitamin D deficiency, unspecified: Secondary | ICD-10-CM | POA: Diagnosis present

## 2023-11-20 DIAGNOSIS — B965 Pseudomonas (aeruginosa) (mallei) (pseudomallei) as the cause of diseases classified elsewhere: Secondary | ICD-10-CM | POA: Diagnosis present

## 2023-11-20 DIAGNOSIS — Z7901 Long term (current) use of anticoagulants: Secondary | ICD-10-CM | POA: Diagnosis not present

## 2023-11-20 DIAGNOSIS — I5032 Chronic diastolic (congestive) heart failure: Secondary | ICD-10-CM | POA: Diagnosis not present

## 2023-11-20 DIAGNOSIS — E1151 Type 2 diabetes mellitus with diabetic peripheral angiopathy without gangrene: Secondary | ICD-10-CM | POA: Diagnosis present

## 2023-11-20 DIAGNOSIS — D631 Anemia in chronic kidney disease: Secondary | ICD-10-CM | POA: Diagnosis not present

## 2023-11-20 DIAGNOSIS — L97309 Non-pressure chronic ulcer of unspecified ankle with unspecified severity: Secondary | ICD-10-CM | POA: Diagnosis not present

## 2023-11-20 DIAGNOSIS — W1811XA Fall from or off toilet without subsequent striking against object, initial encounter: Secondary | ICD-10-CM | POA: Diagnosis present

## 2023-11-20 DIAGNOSIS — I872 Venous insufficiency (chronic) (peripheral): Secondary | ICD-10-CM | POA: Diagnosis not present

## 2023-11-20 DIAGNOSIS — I2510 Atherosclerotic heart disease of native coronary artery without angina pectoris: Secondary | ICD-10-CM | POA: Diagnosis present

## 2023-11-20 DIAGNOSIS — C61 Malignant neoplasm of prostate: Secondary | ICD-10-CM | POA: Diagnosis present

## 2023-11-20 DIAGNOSIS — I350 Nonrheumatic aortic (valve) stenosis: Secondary | ICD-10-CM

## 2023-11-20 DIAGNOSIS — Z8546 Personal history of malignant neoplasm of prostate: Secondary | ICD-10-CM | POA: Diagnosis not present

## 2023-11-20 DIAGNOSIS — I495 Sick sinus syndrome: Secondary | ICD-10-CM | POA: Diagnosis present

## 2023-11-20 DIAGNOSIS — T148XXA Other injury of unspecified body region, initial encounter: Secondary | ICD-10-CM | POA: Diagnosis not present

## 2023-11-20 DIAGNOSIS — L97819 Non-pressure chronic ulcer of other part of right lower leg with unspecified severity: Secondary | ICD-10-CM | POA: Diagnosis not present

## 2023-11-20 DIAGNOSIS — Z950 Presence of cardiac pacemaker: Secondary | ICD-10-CM | POA: Diagnosis present

## 2023-11-20 DIAGNOSIS — S0031XA Abrasion of nose, initial encounter: Secondary | ICD-10-CM | POA: Diagnosis present

## 2023-11-20 LAB — CBC WITH DIFFERENTIAL/PLATELET
Basophils Absolute: 0 K/uL (ref 0.0–0.1)
Basophils Relative: 0 %
Eosinophils Absolute: 0 K/uL (ref 0.0–0.5)
Eosinophils Relative: 0 %
HCT: 26.8 % — ABNORMAL LOW (ref 39.0–52.0)
Hemoglobin: 8.6 g/dL — ABNORMAL LOW (ref 13.0–17.0)
Lymphocytes Relative: 50 %
Lymphs Abs: 7.9 K/uL — ABNORMAL HIGH (ref 0.7–4.0)
MCH: 31.4 pg (ref 26.0–34.0)
MCHC: 32.1 g/dL (ref 30.0–36.0)
MCV: 97.8 fL (ref 80.0–100.0)
Monocytes Absolute: 0.6 K/uL (ref 0.1–1.0)
Monocytes Relative: 4 %
Neutro Abs: 7.2 K/uL (ref 1.7–7.7)
Neutrophils Relative %: 46 %
Platelets: 304 K/uL (ref 150–400)
RBC: 2.74 MIL/uL — ABNORMAL LOW (ref 4.22–5.81)
RDW: 16.9 % — ABNORMAL HIGH (ref 11.5–15.5)
WBC: 15.7 K/uL — ABNORMAL HIGH (ref 4.0–10.5)
nRBC: 0.1 % (ref 0.0–0.2)

## 2023-11-20 LAB — POCT I-STAT, CHEM 8
BUN: 38 mg/dL — ABNORMAL HIGH (ref 8–23)
Calcium, Ion: 1.11 mmol/L — ABNORMAL LOW (ref 1.15–1.40)
Chloride: 95 mmol/L — ABNORMAL LOW (ref 98–111)
Creatinine, Ser: 1.1 mg/dL (ref 0.61–1.24)
Glucose, Bld: 107 mg/dL — ABNORMAL HIGH (ref 70–99)
HCT: 24 % — ABNORMAL LOW (ref 39.0–52.0)
Hemoglobin: 8.2 g/dL — ABNORMAL LOW (ref 13.0–17.0)
Potassium: 3.6 mmol/L (ref 3.5–5.1)
Sodium: 136 mmol/L (ref 135–145)
TCO2: 30 mmol/L (ref 22–32)

## 2023-11-20 LAB — COMPREHENSIVE METABOLIC PANEL WITH GFR
ALT: 18 U/L (ref 0–44)
AST: 21 U/L (ref 15–41)
Albumin: 3.3 g/dL — ABNORMAL LOW (ref 3.5–5.0)
Alkaline Phosphatase: 33 U/L — ABNORMAL LOW (ref 38–126)
Anion gap: 14 (ref 5–15)
BUN: 41 mg/dL — ABNORMAL HIGH (ref 8–23)
CO2: 29 mmol/L (ref 22–32)
Calcium: 9 mg/dL (ref 8.9–10.3)
Chloride: 95 mmol/L — ABNORMAL LOW (ref 98–111)
Creatinine, Ser: 1.13 mg/dL (ref 0.61–1.24)
GFR, Estimated: >60 mL/min (ref >60)
Glucose, Bld: 108 mg/dL — ABNORMAL HIGH (ref 70–99)
Potassium: 3.6 mmol/L (ref 3.5–5.1)
Sodium: 138 mmol/L (ref 135–145)
Total Bilirubin: 1 mg/dL (ref 0.0–1.2)
Total Protein: 6.3 g/dL — ABNORMAL LOW (ref 6.5–8.1)

## 2023-11-20 LAB — PROTIME-INR
INR: 2 — ABNORMAL HIGH (ref 0.8–1.2)
Prothrombin Time: 23.4 s — ABNORMAL HIGH (ref 11.4–15.2)

## 2023-11-20 LAB — I-STAT CG4 LACTIC ACID, ED
Lactic Acid, Venous: 0.6 mmol/L (ref 0.5–1.9)
Lactic Acid, Venous: 0.9 mmol/L (ref 0.5–1.9)

## 2023-11-20 MED ORDER — WARFARIN - PHARMACIST DOSING INPATIENT: Freq: Every day | Status: DC

## 2023-11-20 MED ORDER — VANCOMYCIN HCL 750 MG/150ML IV SOLN
750.0000 mg | INTRAVENOUS | Status: DC
  Administered 2023-11-21 – 2023-11-22 (×2): 750 mg via INTRAVENOUS
  Filled 2023-11-20 (×4): qty 150

## 2023-11-20 MED ORDER — POLYETHYLENE GLYCOL 3350 17 G PO PACK
17.0000 g | PACK | Freq: Every day | ORAL | Status: DC | PRN
  Filled 2023-11-20: qty 1

## 2023-11-20 MED ORDER — ALBUTEROL SULFATE (2.5 MG/3ML) 0.083% IN NEBU
2.5000 mg | INHALATION_SOLUTION | RESPIRATORY_TRACT | Status: DC | PRN
  Filled 2023-11-20 (×6): qty 3

## 2023-11-20 MED ORDER — VANCOMYCIN HCL 1250 MG/250ML IV SOLN
1250.0000 mg | Freq: Once | INTRAVENOUS | Status: AC
  Administered 2023-11-20: 1250 mg via INTRAVENOUS
  Filled 2023-11-20: qty 250

## 2023-11-20 MED ORDER — FUROSEMIDE 40 MG PO TABS
80.0000 mg | ORAL_TABLET | Freq: Two times a day (BID) | ORAL | Status: DC
  Administered 2023-11-20 – 2023-11-29 (×17): 80 mg via ORAL
  Filled 2023-11-20 (×3): qty 4
  Filled 2023-11-20 (×4): qty 2
  Filled 2023-11-20 (×3): qty 4
  Filled 2023-11-20: qty 2
  Filled 2023-11-20: qty 4
  Filled 2023-11-20 (×3): qty 2
  Filled 2023-11-20 (×2): qty 4
  Filled 2023-11-20 (×3): qty 2
  Filled 2023-11-20: qty 4
  Filled 2023-11-20: qty 2
  Filled 2023-11-20: qty 4
  Filled 2023-11-20: qty 2

## 2023-11-20 MED ORDER — PANTOPRAZOLE SODIUM 40 MG PO TBEC
40.0000 mg | DELAYED_RELEASE_TABLET | Freq: Every day | ORAL | Status: DC
  Administered 2023-11-20 – 2023-11-29 (×10): 40 mg via ORAL
  Filled 2023-11-20 (×11): qty 1

## 2023-11-20 MED ORDER — METOPROLOL SUCCINATE ER 50 MG PO TB24
50.0000 mg | ORAL_TABLET | Freq: Every day | ORAL | Status: DC
  Administered 2023-11-20 – 2023-11-29 (×10): 50 mg via ORAL
  Filled 2023-11-20: qty 2
  Filled 2023-11-20: qty 1
  Filled 2023-11-20: qty 2
  Filled 2023-11-20 (×2): qty 1
  Filled 2023-11-20 (×3): qty 2
  Filled 2023-11-20 (×3): qty 1

## 2023-11-20 MED ORDER — ACETAMINOPHEN 325 MG PO TABS
650.0000 mg | ORAL_TABLET | Freq: Four times a day (QID) | ORAL | Status: DC | PRN
  Administered 2023-11-21 – 2023-11-25 (×8): 650 mg via ORAL
  Filled 2023-11-20 (×12): qty 2

## 2023-11-20 MED ORDER — SODIUM CHLORIDE 0.9 % IV SOLN: 250.0000 mL | INTRAVENOUS | Status: AC | PRN

## 2023-11-20 MED ORDER — INFLUENZA VAC SPLIT HIGH-DOSE 0.5 ML IM SUSY
0.5000 mL | PREFILLED_SYRINGE | INTRAMUSCULAR | Status: DC
  Filled 2023-11-20 (×2): qty 0.5

## 2023-11-20 MED ORDER — METOLAZONE 2.5 MG PO TABS
2.5000 mg | ORAL_TABLET | ORAL | Status: DC
  Administered 2023-11-23: 2.5 mg via ORAL
  Filled 2023-11-20 (×2): qty 1

## 2023-11-20 MED ORDER — WARFARIN SODIUM 5 MG PO TABS
5.0000 mg | ORAL_TABLET | Freq: Every day | ORAL | Status: DC
  Administered 2023-11-20 – 2023-11-23 (×4): 5 mg via ORAL
  Filled 2023-11-20 (×6): qty 1

## 2023-11-20 MED ORDER — HYDRALAZINE HCL 20 MG/ML IJ SOLN
10.0000 mg | Freq: Four times a day (QID) | INTRAMUSCULAR | Status: DC | PRN
  Filled 2023-11-20 (×4): qty 0.5

## 2023-11-20 MED ORDER — VANCOMYCIN HCL 1250 MG/250ML IV SOLN
1250.0000 mg | Freq: Once | INTRAVENOUS | Status: DC
Start: 2023-11-20 — End: 2023-11-20
  Filled 2023-11-20 (×2): qty 250

## 2023-11-20 MED ORDER — ONDANSETRON HCL 4 MG PO TABS: 4.0000 mg | ORAL_TABLET | Freq: Four times a day (QID) | ORAL | Status: DC | PRN

## 2023-11-20 MED ORDER — DIGOXIN 125 MCG PO TABS
0.1250 mg | ORAL_TABLET | Freq: Every evening | ORAL | Status: DC
  Administered 2023-11-20 – 2023-11-28 (×9): 0.125 mg via ORAL
  Filled 2023-11-20 (×12): qty 1

## 2023-11-20 MED ORDER — ONDANSETRON HCL 4 MG/2ML IJ SOLN: 4.0000 mg | Freq: Four times a day (QID) | INTRAMUSCULAR | Status: DC | PRN

## 2023-11-20 MED ORDER — SODIUM CHLORIDE 0.9% FLUSH
3.0000 mL | INTRAVENOUS | Status: DC | PRN
  Administered 2023-11-22: 3 mL via INTRAVENOUS

## 2023-11-20 MED ORDER — SODIUM CHLORIDE 0.9% FLUSH
3.0000 mL | Freq: Two times a day (BID) | INTRAVENOUS | Status: DC
  Administered 2023-11-21 – 2023-11-23 (×4): 3 mL via INTRAVENOUS

## 2023-11-20 MED ORDER — VANCOMYCIN HCL IN DEXTROSE 1-5 GM/200ML-% IV SOLN: 1000.0000 mg | Freq: Once | INTRAVENOUS | Status: DC

## 2023-11-20 MED ORDER — VITAMIN D 25 MCG (1000 UNIT) PO TABS
5000.0000 [IU] | ORAL_TABLET | ORAL | Status: DC
  Administered 2023-11-23 – 2023-11-26 (×2): 5000 [IU] via ORAL
  Filled 2023-11-20 (×3): qty 5

## 2023-11-20 MED ORDER — CHOLECALCIFEROL 125 MCG (5000 UT) PO CAPS: 5000.0000 [IU] | ORAL_CAPSULE | ORAL | Status: DC

## 2023-11-20 MED ORDER — VANCOMYCIN HCL 750 MG/150ML IV SOLN: 750.0000 mg | INTRAVENOUS | Status: DC

## 2023-11-20 MED ORDER — ACETAMINOPHEN 650 MG RE SUPP
650.0000 mg | Freq: Four times a day (QID) | RECTAL | Status: DC | PRN
  Filled 2023-11-20: qty 1

## 2023-11-20 NOTE — ED Provider Notes (Addendum)
 [place] EMERGENCY DEPARTMENT AT [facility] Provider Note   CSN: [id number] Arrival date & time: 11/20/23  1333     Patient presents with: Wound Infection   Robert [name] is a 89 y.o. male who was recently admitted for treatment of a right lower leg wound. Patient was initially admitted to the hospital for one posterior calf wound and then was transitioned to the hospital at home program, where he finished up his antibiotic course. However, since Monday, 9/15, he has developed two additional wounds (photographs below) and the initial one on his posterior calf is now oozing and was green yesterday, per his daughter. Additionally, he has had his labs checked by his PCP and his WBC count initially went down with antibiotic treatment, and as of yesterday has increased again, warranting his return to the hospital.  Of note, patient fell off the toilet this morning and hit his head and nose on the door. No LOC. Patient was able to get himself up without assistance. He is on Warfarin, last dose 5pm yesterday.  HPI     Prior to Admission medications   Medication Sig Start Date End Date Taking? Authorizing Provider  acetaminophen  (TYLENOL ) 325 MG tablet Take 650 mg by mouth every 6 (six) hours as needed for moderate pain.    [provider]  Ascorbic Acid (VITAMIN C PO) Take 1 tablet by mouth daily.    [provider]  Cholecalciferol  (DIALYVITE VITAMIN D  5000) 125 MCG (5000 UT) capsule Take 5,000 Units by mouth 2 (two) times a week.    [provider]  digoxin  (LANOXIN ) 0.125 MG tablet Take 1 tablet (0.125 mg total) by mouth every evening. 04/25/23   Arlon Carliss ORN, DO  furosemide  (LASIX ) 80 MG tablet Take 1 tablet (80 mg total) by mouth 2 (two) times daily. 09/18/23   Croitoru, Mihai, MD  Lactobacillus (PROBIOTIC ACIDOPHILUS) CAPS Take 1 capsule by mouth in the morning and at bedtime for 10 days. 11/07/23 11/17/23  Laurence Locus, DO  leptospermum manuka  honey (MEDIHONEY) PSTE paste Apply 1 Application topically daily for 7 days. Interchangeable with TheraHoney  Cleanse trauma wound to right posterior leg with NS and pat dry Apply medihoney to wound bed to facilitate debridement. Cover with dry dressing. Change daily. Apply thin layer (3 mm) to wound. 11/07/23 11/14/23  Laurence Locus, DO  metolazone  (ZAROXOLYN ) 2.5 MG tablet Take 1 tablet (2.5 mg total) by mouth once a week. 11/11/23   Santo Stanly LABOR, MD  metoprolol  succinate (TOPROL -XL) 50 MG 24 hr tablet Take 1 tablet (50 mg total) by mouth daily. Take with or immediately following a meal. 09/25/23   Duke, Jon Garre, PA  mupirocin  ointment (BACTROBAN ) 2 % Apply 1 Application topically 2 (two) times daily. 10/29/23   [provider]  pantoprazole  (PROTONIX ) 40 MG tablet Take 40 mg by mouth daily. 03/13/23   [provider]  warfarin (COUMADIN ) 5 MG tablet TAKE 1 TABLET DAILY AS DIRECTED BY THE COUMADIN  CLINIC. Patient taking differently: Take 5 mg by mouth daily. 12/30/22   Dunn, Dayna N, PA-C    Allergies: Amoxicillin, Lisinopril, and Kenalog  [triamcinolone ]    Review of Systems  Skin:  Positive for wound.       3 wounds on RLE. Posterior calf wound with serosanguinous fluid, two other wounds dark red; surrounding leg tissue erythematous. Not warm to touch. No systemic symptoms     Updated Vital Signs BP (!) 116/56   Pulse 69  Temp 98.4 F (36.9 C) (Oral)   Resp 16   Ht 5' 4 (1.626 m)   Wt 66.7 kg   SpO2 96%   BMI 25.23 kg/m   Physical Exam Vitals and nursing note reviewed.  Constitutional:      Appearance: Normal appearance. He is not ill-appearing.  HENT:     Head: Abrasion present.     Comments: Right forehead s/p fall off the toilet this morning. Bleeding controlled at this time.    Nose: Signs of injury present.     Comments: Small nose abrasion to nose. Bleeding controlled at this time Eyes:     General: No scleral icterus. Pulmonary:     Effort:  Pulmonary effort is normal. No respiratory distress.  Musculoskeletal:        General: No deformity.     Right lower leg: 3+ Pitting Edema present.     Left lower leg: 3+ Pitting Edema present.  Skin:    General: Skin is warm and dry.     Coloration: Skin is not jaundiced.     Findings: Wound present.     Comments: 3 wounds on RLE. Posterior calf wound with serosanguinous fluid, two other wounds dark red; surrounding leg tissue erythematous. Not warm to touch. No systemic symptoms   Neurological:     General: No focal deficit present.     Mental Status: He is alert.  Psychiatric:        Mood and Affect: Mood normal.     (all labs ordered are listed, but only abnormal results are displayed) Labs Reviewed  COMPREHENSIVE METABOLIC PANEL WITH GFR - Abnormal; Notable for the following components:      Result Value   Chloride 95 (*)    Glucose, Bld 108 (*)    BUN 41 (*)    Total Protein 6.3 (*)    Albumin  3.3 (*)    Alkaline Phosphatase 33 (*)    All other components within normal limits  CBC WITH DIFFERENTIAL/PLATELET - Abnormal; Notable for the following components:   WBC 15.7 (*)    RBC 2.74 (*)    Hemoglobin 8.6 (*)    HCT 26.8 (*)    RDW 16.9 (*)    Lymphs Abs 7.9 (*)    All other components within normal limits  AEROBIC/ANAEROBIC CULTURE W GRAM STAIN (SURGICAL/DEEP WOUND)  I-STAT CG4 LACTIC ACID, ED  I-STAT CG4 LACTIC ACID, ED    EKG: None  Radiology: No results found.  Procedures   Medications Ordered in the ED  vancomycin  (VANCOREADY) IVPB 1250 mg/250 mL (has no administration in time range)                                  Medical Decision Making Amount and/or Complexity of Data Reviewed Labs: ordered.  Risk Prescription drug management. Decision regarding hospitalization.   This patient presents to the ED for concern of wound infection, this involves an extensive number of treatment options, and is a complaint that carries with it a high risk of  complications and morbidity.  Differential diagnosis includes: cellulitis, necrotizing fascitis, venous stasis ulcer, pressure ulcer, ischemic ulcer.  Co morbidities: None   Additional history:  Patient was admitted to the hospital on 9/1 for initial presentation of wound. He was placed on IV vancomycin  and transition to Linezolid  outpatient. He was under the care of Dr. Laurence, hospitalist/hospital at home  Patient was then seen by  his PCP on 9/7 for hospital follow up and was found to have an increasing WBC count. He was then referred back to the hospital.  Lab Tests:  None  Imaging Studies:  None   Medicines ordered and prescription drug management:  I ordered medication including  Medications  vancomycin  (VANCOREADY) IVPB 1250 mg/250 mL (has no administration in time range)   For wound infection Reevaluation of the patient after these medicines showed that the patient improved I have reviewed the patients home medicines and have made adjustments as needed    Consultations Obtained: - Hospitalist  Problem List / ED Course:     ICD-10-CM   1. Wound infection  T14.8XXA    L08.9     2. Cellulitis of right lower extremity  L03.115       MDM: Patient reports to the hospital for worsening lower right leg wound. He was initially admitted on 9/1 for cellulitis of his right leg. He recently completed his course of outpatient antibiotics. His WBC count has continued to increase despite adequate antibiotic treatment. He now presents with worsening wound and two additional wounds that were not present on his first admission. I ordered wound culture to appropriately tailor his antibiotic therapy and placed a consult to the hospitalist for further guidance on treatment.   Dispostion:  After consideration of the diagnostic results and the patients response to treatment, I feel that the patient would benefit from IV antibiotic therapy and admission to the hospital.   Final  diagnoses:  Wound infection  Cellulitis of right lower extremity    ED Discharge Orders     None          Torrence Marry RAMAN, PA-C 11/20/23 1553    Torrence Marry RAMAN, PA-C 11/20/23 1606    [name] Rogue, MD 11/24/23 [phone]

## 2023-11-20 NOTE — H&P (Signed)
 History and Physical    Patient: Robert [name] FMW:[id number] DOB: 1927/05/23 DOA: 11/20/2023 DOS: the patient was seen and examined on 11/20/2023 PCP: [name], [name]  E, PA  Patient coming from: Home  Chief Complaint:  Chief Complaint  Patient presents with   Wound Infection   HPI: Robert [name] is a 89 y.o. male with medical history significant Patient is a 89 y.o.  male with history of HTN, HLD, A-fib on Coumadin , CAD s/p CABG, aortic stenosis-s/p aortic valve replacement, PPM implantation, chronic HFpEF who was recently hospitalized and then transition to hospital at home program-discharged on 9/6 for RLE ulcer with cellulitis-presented to the hospital with worsening right leg swelling/erythema and development of 2 additional ulcers.  Per history obtained-since this past Monday (9/15)-he developed 2 additional ulcers in his right leg-since then he has had some greenish appearing discharge from these ulcers.  His right leg has become more erythematous and swollen compared to the past few days.  He was seen by his primary care practitioner-superficial cultures were obtained-a referral to [name] was placed-he was subsequently restarted on an oral antibiotic.  Due to continued worsening of his right leg wound/erythema/swelling-he presented to the ED for evaluation-subsequently the hospitalist service was asked to admit this patient for further evaluation and treatment  No fever No shortness of breath/chest pain No nausea, vomiting or diarrhea No dysuria/hematuria No hematochezia/melena.  Review of Systems: As mentioned in the history of present illness. All other systems reviewed and are negative. Past Medical History:  Diagnosis Date   Acute CHF (HCC) 05/06/2013   Acute respiratory failure with hypoxia (HCC) 04/24/2023   Aortic stenosis    a. s/p tissue AVR 05/2013;  b. Echo (06/2013):  Mod LVH, EF 60-65%, no RWMA, Gr 2 DD, AVR ok (mean 13 mmHg), MAC, mild MR, mod LAE,  mild RAE, PASP 46 mmHg (mild pulmo HTN)   Atrial fibrillation with RVR (HCC) 05/06/2013   Blood loss anemia    a. 05/2009 a/w GIB.   CAD (coronary artery disease)    a. Cath 04/2009: BMS to LCx 04/2009; CTO of RCA with L-R collaterals, 40% ostial diag.   Carpal tunnel syndrome    bilateral, carpal tunnel release x 2   Esophageal ulcer    a. 05/2009 with hemorrhage - injected with epinephrine -Dr. Lamar Bunk.   Esophagitis    a. 05/2009 a/w GIB.   GERD (gastroesophageal reflux disease)    EGD, Dr. Quay 2008   GI bleed    a. 05/2009 as above: with associated anemia. Lower esophageal ulcer with extensive erosive esophagitis and large clot by EGD 05/2009.    Hx of colonoscopy 2008   Dr. Cindy, polyps- 5 yr f/u recommended   Hyperlipidemia    Hypertension    Influenza A 04/24/2023   PAF (paroxysmal atrial fibrillation) (HCC)    a. Post-op AVR;  b. 04/2014 recurrent.   Perforated ear drum    right ear drum, tube in left ear drum, Dr. Jesus   Polymyalgia rheumatica [facility])    Prostate cancer [facility])    Vitamin D  deficiency    Past Surgical History:  Procedure Laterality Date   AORTIC VALVE REPLACEMENT N/A 05/12/2013   Procedure: AORTIC VALVE REPLACEMENT (AVR);  Surgeon: Maude Fleeta Ochoa, MD;  Location: [facility] OR;  Service: Open Heart Surgery;  Laterality: N/A;   CARDIAC CATHETERIZATION  2011   CARDIOVERSION N/A 04/12/2020   Procedure: CARDIOVERSION;  Surgeon: Maranda Leim DEL, MD;  Location: [facility] ENDOSCOPY;  Service: Cardiovascular;  Laterality: N/A;   CORONARY ARTERY BYPASS GRAFT N/A 05/12/2013   Procedure: CORONARY ARTERY BYPASS GRAFTING (CABG);  Surgeon: Maude Fleeta Ochoa, MD;  Location: [facility] OR;  Service: Open Heart Surgery;  Laterality: N/A;  CABG x 1 using left leg greater saphenous vein harvested endoscopically   INTRAOPERATIVE TRANSESOPHAGEAL ECHOCARDIOGRAM N/A 05/12/2013   Procedure: INTRAOPERATIVE TRANSESOPHAGEAL ECHOCARDIOGRAM;  Surgeon: Maude Fleeta Ochoa, MD;  Location: [facility]  OR;  Service: Open Heart Surgery;  Laterality: N/A;   LEFT AND RIGHT HEART CATHETERIZATION WITH CORONARY ANGIOGRAM N/A 05/09/2013   Procedure: LEFT AND RIGHT HEART CATHETERIZATION WITH CORONARY ANGIOGRAM;  Surgeon: Dorn JINNY Lesches, MD;  Location: [facility] CATH LAB;  Service: Cardiovascular;  Laterality: N/A;   MAZE N/A 05/12/2013   Procedure: MAZE;  Surgeon: Maude Fleeta Ochoa, MD;  Location: [facility] OR;  Service: Open Heart Surgery;  Laterality: N/A;   PACEMAKER IMPLANT N/A 05/21/2020   Procedure: PACEMAKER IMPLANT;  Surgeon: Waddell Danelle ORN, MD;  Location: [facility] INVASIVE CV LAB;  Service: Cardiovascular;  Laterality: N/A;   PROSTATECTOMY     TEE WITHOUT CARDIOVERSION N/A 04/12/2020   Procedure: TRANSESOPHAGEAL ECHOCARDIOGRAM (TEE);  Surgeon: Maranda Leim DEL, MD;  Location: [facility] ENDOSCOPY;  Service: Cardiovascular;  Laterality: N/A;   Social History:  reports that he has never smoked. He has never used smokeless tobacco. He reports that he does not drink alcohol and does not use drugs.  Allergies  Allergen Reactions   Amoxicillin Swelling    penile swelling   Lisinopril Cough   Kenalog  [Triamcinolone ] Rash    Family History  Problem Relation Age of Onset   CVA Mother    Kidney disease Father    Breast cancer Daughter     Prior to Admission medications   Medication Sig Start Date End Date Taking? Authorizing Provider  acetaminophen  (TYLENOL ) 325 MG tablet Take 650 mg by mouth every 6 (six) hours as needed for moderate pain.    [provider]  Ascorbic Acid (VITAMIN C PO) Take 1 tablet by mouth daily.    [provider]  Cholecalciferol  (DIALYVITE VITAMIN D  5000) 125 MCG (5000 UT) capsule Take 5,000 Units by mouth 2 (two) times a week.    [provider]  digoxin  (LANOXIN ) 0.125 MG tablet Take 1 tablet (0.125 mg total) by mouth every evening. 04/25/23   Arlon Carliss ORN, DO  furosemide  (LASIX ) 80 MG tablet Take 1 tablet (80 mg total) by mouth 2 (two) times daily. 09/18/23    Croitoru, Mihai, MD  Lactobacillus (PROBIOTIC ACIDOPHILUS) CAPS Take 1 capsule by mouth in the morning and at bedtime for 10 days. 11/07/23 11/17/23  Laurence Locus, DO  leptospermum manuka honey (MEDIHONEY) PSTE paste Apply 1 Application topically daily for 7 days. Interchangeable with TheraHoney  Cleanse trauma wound to right posterior leg with NS and pat dry Apply medihoney to wound bed to facilitate debridement. Cover with dry dressing. Change daily. Apply thin layer (3 mm) to wound. 11/07/23 11/14/23  Laurence Locus, DO  metolazone  (ZAROXOLYN ) 2.5 MG tablet Take 1 tablet (2.5 mg total) by mouth once a week. 11/11/23   Santo Stanly LABOR, MD  metoprolol  succinate (TOPROL -XL) 50 MG 24 hr tablet Take 1 tablet (50 mg total) by mouth daily. Take with or immediately following a meal. 09/25/23   Duke, Jon Garre, PA  mupirocin  ointment (BACTROBAN ) 2 % Apply 1 Application topically 2 (two) times daily. 10/29/23   [provider]  pantoprazole  (PROTONIX ) 40 MG tablet Take 40 mg by mouth daily. 03/13/23  [provider]  warfarin (COUMADIN ) 5 MG tablet TAKE 1 TABLET DAILY AS DIRECTED BY THE COUMADIN  CLINIC. Patient taking differently: Take 5 mg by mouth daily. 12/30/22   Dunn, Dayna N, PA-C    Physical Exam: Vitals:   11/20/23 1338 11/20/23 1352  BP: (!) 116/56   Pulse: 69   Resp: 16   Temp: 98.4 F (36.9 C)   TempSrc: Oral   SpO2: 96%   Weight:  66.7 kg  Height:  5' 4 (1.626 m)   Gen Exam:Alert awake-not in any distress HEENT:atraumatic, normocephalic Chest: B/L clear to auscultation anteriorly CVS:S1S2 regular Abdomen:soft non tender, non distended Extremities: Left leg with some superficial varicose veins-see picture below with right leg.  Please note no crepitus-no obvious purulence observed in the right lower extremity. Neurology: Non focal Skin: no rash         Data Reviewed:     Latest Ref Rng & Units 11/20/2023    1:56 PM 11/07/2023   10:38 AM 11/06/2023    9:42  AM  CBC  WBC 4.0 - 10.5 K/uL 15.7  12.8  13.2   Hemoglobin 13.0 - 17.0 g/dL 8.6  9.8  89.8   Hematocrit 39.0 - 52.0 % 26.8  30.5  31.1   Platelets 150 - 400 K/uL 304  242  260        Latest Ref Rng & Units 11/20/2023    1:56 PM 11/06/2023    9:42 AM 11/05/2023   10:11 AM  BMP  Glucose 70 - 99 mg/dL 891  92  97   BUN 8 - 23 mg/dL 41  25  22   Creatinine 0.61 - 1.24 mg/dL 8.86  8.83  8.95   Sodium 135 - 145 mmol/L 138  136  137   Potassium 3.5 - 5.1 mmol/L 3.6  3.7  3.8   Chloride 98 - 111 mmol/L 95  98  96   CO2 22 - 32 mmol/L 29  26  29    Calcium  8.9 - 10.3 mg/dL 9.0  9.1  9.0      Assessment and Plan: RLE ulceration with cellulitis 2 additional new ulcers in the past 1 week have formed-more swelling/erythema-initial suspicion that this could be venous ulcers but per my phone conversation with Dr. Karma that this could be arterial as well. Has failed oral antibiotics-has significant leukocytosis-recently hospitalized and actually improved with IV antibiotics-Will start on IV vancomycin . [name] formally consulted-he will evaluate tomorrow morning-but recommends that we proceed with an ABI Patient/family are very keen on going home with hospital at home program-once ABI/Ortho evaluation have been done-and no further workup required-they would prefer to continue IV antibiotics at home.   PAF Sinus rhythm Continue digoxin /metoprolol  INR therapeutic-warfarin per pharmacy   History of tachybradycardia syndrome Pacemaker in place   CAD s/p CABG No anginal symptoms Continue statin/beta-blocker Suspect not on ASA as patient on Coumadin    History of aortic stenosis-s/p aortic valve replacement Supportive care   Chronic HFpEF Euvolemic-RLE swelling is not from CHF or from cellulitis/soft tissue infection Continue furosemide /beta-blocker.   HTN BP stable Continue metoprolol /Lasix     Normocytic anemia Secondary to chronic disease No evidence of blood loss Trend CBC    History of prostate cancer Lupron  every 6 months.   Advance Care Planning:   Code Status: Limited: Do not attempt resuscitation (DNR) -DNR-LIMITED -Do Not Intubate/DNI    Consults: Orthopedics  Family Communication: Spouse/2 daughters at bedside  Severity of Illness: The appropriate patient status  for this patient is INPATIENT. Inpatient status is judged to be reasonable and necessary in order to provide the required intensity of service to ensure the patient's safety. The patient's presenting symptoms, physical exam findings, and initial radiographic and laboratory data in the context of their chronic comorbidities is felt to place them at high risk for further clinical deterioration. Furthermore, it is not anticipated that the patient will be medically stable for discharge from the hospital within 2 midnights of admission.   * I certify that at the point of admission it is my clinical judgment that the patient will require inpatient hospital care spanning beyond 2 midnights from the point of admission due to high intensity of service, high risk for further deterioration and high frequency of surveillance required.*  Author: Donalda Applebaum, MD 11/20/2023 5:14 PM  For on call review [email or web address].

## 2023-11-20 NOTE — Progress Notes (Signed)
 Pharmacy Antibiotic Note  Robert [name] is a 89 y.o. male for which pharmacy has been consulted for vancomycin  dosing for cellulitis.  Patient with a history of HTN, HLD, AF on coumadin , CAD s/p CABG, aortic stenosis s/p AVR, PPM, HFpEF. Patient presenting with wound infection.  SCr 1.13 WBC 15.7; LA 0.9; T 98.4; HR 75; RR 16  Plan: Vancomycin  1250 mg once then 750 mg q24hr (eAUC 485.3) unless change in renal function Monitor WBC, fever, renal function, cultures De-escalate when able Levels at steady state  Height: 5' 4 (162.6 cm) Weight: 66.7 kg (147 lb) IBW/kg (Calculated) : 59.2  Temp (24hrs), Avg:98.4 F (36.9 C), Min:98.4 F (36.9 C), Max:98.4 F (36.9 C)  Recent Labs  Lab 11/20/23 1356 11/20/23 1412 11/20/23 1625  WBC 15.7*  --   --   CREATININE 1.13  --   --   LATICACIDVEN  --  0.6 0.9    Estimated Creatinine Clearance: 33.5 mL/min (by C-G formula based on SCr of 1.13 mg/dL).    Allergies  Allergen Reactions   Amoxicillin Swelling    penile swelling   Lisinopril Cough   Kenalog  [Triamcinolone ] Rash   Microbiology results: Pending  Thank you for allowing pharmacy to be a part of this patient's care.  Dorn Buttner, PharmD, BCPS 11/20/2023 5:41 PM ED Clinical Pharmacist -  [phone]

## 2023-11-20 NOTE — Progress Notes (Signed)
 ANTICOAGULATION CONSULT NOTE  Pharmacy Consult for Warfarin Indication: atrial fibrillation  Allergies  Allergen Reactions   Amoxicillin Swelling    penile swelling   Lisinopril Cough   Kenalog  [Triamcinolone ] Rash    Patient Measurements: Height: 5' 4 (162.6 cm) Weight: 66.7 kg (147 lb) IBW/kg (Calculated) : 59.2  Vital Signs: Temp: 98.4 F (36.9 C) (09/19 1338) Temp Source: Oral (09/19 1338) BP: 116/56 (09/19 1338) Pulse Rate: 69 (09/19 1338)  Labs: Recent Labs    11/20/23 1356  HGB 8.6*  HCT 26.8*  PLT 304  CREATININE 1.13    Estimated Creatinine Clearance: 33.5 mL/min (by C-G formula based on SCr of 1.13 mg/dL).   Medical History: Past Medical History:  Diagnosis Date   Acute CHF (HCC) 05/06/2013   Acute respiratory failure with hypoxia (HCC) 04/24/2023   Aortic stenosis    a. s/p tissue AVR 05/2013;  b. Echo (06/2013):  Mod LVH, EF 60-65%, no RWMA, Gr 2 DD, AVR ok (mean 13 mmHg), MAC, mild MR, mod LAE, mild RAE, PASP 46 mmHg (mild pulmo HTN)   Atrial fibrillation with RVR (HCC) 05/06/2013   Blood loss anemia    a. 05/2009 a/w GIB.   CAD (coronary artery disease)    a. Cath 04/2009: BMS to LCx 04/2009; CTO of RCA with L-R collaterals, 40% ostial diag.   Carpal tunnel syndrome    bilateral, carpal tunnel release x 2   Esophageal ulcer    a. 05/2009 with hemorrhage - injected with epinephrine -Dr. Lamar Bunk.   Esophagitis    a. 05/2009 a/w GIB.   GERD (gastroesophageal reflux disease)    EGD, Dr. Quay 2008   GI bleed    a. 05/2009 as above: with associated anemia. Lower esophageal ulcer with extensive erosive esophagitis and large clot by EGD 05/2009.    Hx of colonoscopy 2008   Dr. Cindy, polyps- 5 yr f/u recommended   Hyperlipidemia    Hypertension    Influenza A 04/24/2023   PAF (paroxysmal atrial fibrillation) (HCC)    a. Post-op AVR;  b. 04/2014 recurrent.   Perforated ear drum    right ear drum, tube in left ear drum, Dr.  Jesus   Polymyalgia rheumatica [facility])    Prostate cancer (HCC)    Vitamin D  deficiency     Medications:  Medications Prior to Admission  Medication Sig Dispense Refill Last Dose/Taking   acetaminophen  (TYLENOL ) 325 MG tablet Take 650 mg by mouth every 6 (six) hours as needed for moderate pain.      Ascorbic Acid (VITAMIN C PO) Take 1 tablet by mouth daily.      Cholecalciferol  (DIALYVITE VITAMIN D  5000) 125 MCG (5000 UT) capsule Take 5,000 Units by mouth 2 (two) times a week.      digoxin  (LANOXIN ) 0.125 MG tablet Take 1 tablet (0.125 mg total) by mouth every evening.      furosemide  (LASIX ) 80 MG tablet Take 1 tablet (80 mg total) by mouth 2 (two) times daily.      Lactobacillus (PROBIOTIC ACIDOPHILUS) CAPS Take 1 capsule by mouth in the morning and at bedtime for 10 days. 20 capsule 0    leptospermum manuka honey (MEDIHONEY) PSTE paste Apply 1 Application topically daily for 7 days. Interchangeable with TheraHoney  Cleanse trauma wound to right posterior leg with NS and pat dry Apply medihoney to wound bed to facilitate debridement. Cover with dry dressing. Change daily. Apply thin layer (3 mm) to wound. 88 mL 0    metolazone  (  ZAROXOLYN ) 2.5 MG tablet Take 1 tablet (2.5 mg total) by mouth once a week. 12 tablet 2    metoprolol  succinate (TOPROL -XL) 50 MG 24 hr tablet Take 1 tablet (50 mg total) by mouth daily. Take with or immediately following a meal. 180 tablet 1    mupirocin  ointment (BACTROBAN ) 2 % Apply 1 Application topically 2 (two) times daily.      pantoprazole  (PROTONIX ) 40 MG tablet Take 40 mg by mouth daily.      warfarin (COUMADIN ) 5 MG tablet TAKE 1 TABLET DAILY AS DIRECTED BY THE COUMADIN  CLINIC. (Patient taking differently: Take 5 mg by mouth daily.) 100 tablet 3    Scheduled:   [START ON 11/23/2023] Cholecalciferol   5,000 Units Oral Once per day on Monday Thursday   digoxin   0.125 mg Oral QPM   furosemide   80 mg Oral BID   metolazone   2.5 mg Oral Weekly   metoprolol   succinate  50 mg Oral Daily   pantoprazole   40 mg Oral Daily   sodium chloride  flush  3 mL Intravenous Q12H   Infusions:   sodium chloride      vancomycin      [START ON 11/21/2023] vancomycin      PRN: sodium chloride , acetaminophen  **OR** acetaminophen , albuterol , hydrALAZINE , ondansetron  **OR** ondansetron  (ZOFRAN ) IV, polyethylene glycol, sodium chloride  flush  Assessment: 89 yom with a history of HTN, HLD, AF on coumadin , CAD s/p CABG, aortic stenosis s/p AVR, PPM, HFpEF. Patient presenting with wound infection. . Warfarin per pharmacy consult placed for atrial fibrillation.  Patient taking warfarin prior to arrival. Home dose is 5 mg daily. Last taken 9/18 4pm per med history.  PT / INR today is 23.4 / 2, which is borderline therapeutic Hgb 8.6; plt 304  Goal of Therapy:  INR Goal 2-3 Monitor platelets by anticoagulation protocol: Yes   Plan:  Continue warfarin 5mg  daily Monitor for s/s of hemorrhage, daily INR, CBC Watch for new DDIs  Dorn Buttner, PharmD, BCPS 11/20/2023 5:45 PM ED Clinical Pharmacist -  [phone]

## 2023-11-20 NOTE — ED Triage Notes (Signed)
 Patient states he was released to home care hospital and finished up antibiotics was seen by PCP yest and had blood work was called today to return for more antibiotics his WBC is up

## 2023-11-21 ENCOUNTER — Inpatient Hospital Stay (HOSPITAL_COMMUNITY)

## 2023-11-21 DIAGNOSIS — L089 Local infection of the skin and subcutaneous tissue, unspecified: Principal | ICD-10-CM

## 2023-11-21 DIAGNOSIS — L97309 Non-pressure chronic ulcer of unspecified ankle with unspecified severity: Secondary | ICD-10-CM | POA: Diagnosis not present

## 2023-11-21 DIAGNOSIS — T148XXA Other injury of unspecified body region, initial encounter: Secondary | ICD-10-CM | POA: Diagnosis not present

## 2023-11-21 DIAGNOSIS — L03115 Cellulitis of right lower limb: Secondary | ICD-10-CM | POA: Diagnosis not present

## 2023-11-21 LAB — BASIC METABOLIC PANEL WITH GFR
Anion gap: 13 (ref 5–15)
BUN: 35 mg/dL — ABNORMAL HIGH (ref 8–23)
CO2: 29 mmol/L (ref 22–32)
Calcium: 8.9 mg/dL (ref 8.9–10.3)
Chloride: 96 mmol/L — ABNORMAL LOW (ref 98–111)
Creatinine, Ser: 1.01 mg/dL (ref 0.61–1.24)
GFR, Estimated: >60 mL/min (ref >60)
Glucose, Bld: 110 mg/dL — ABNORMAL HIGH (ref 70–99)
Potassium: 3.7 mmol/L (ref 3.5–5.1)
Sodium: 138 mmol/L (ref 135–145)

## 2023-11-21 LAB — CBC
HCT: 26.7 % — ABNORMAL LOW (ref 39.0–52.0)
Hemoglobin: 8.7 g/dL — ABNORMAL LOW (ref 13.0–17.0)
MCH: 31.8 pg (ref 26.0–34.0)
MCHC: 32.6 g/dL (ref 30.0–36.0)
MCV: 97.4 fL (ref 80.0–100.0)
Platelets: 335 K/uL (ref 150–400)
RBC: 2.74 MIL/uL — ABNORMAL LOW (ref 4.22–5.81)
RDW: 16.8 % — ABNORMAL HIGH (ref 11.5–15.5)
WBC: 17 K/uL — ABNORMAL HIGH (ref 4.0–10.5)
nRBC: 0 % (ref 0.0–0.2)

## 2023-11-21 LAB — PROTIME-INR
INR: 2.2 — ABNORMAL HIGH (ref 0.8–1.2)
Prothrombin Time: 25.4 s — ABNORMAL HIGH (ref 11.4–15.2)

## 2023-11-21 NOTE — Progress Notes (Signed)
 VASCULAR LAB    ABI has been performed.  See CV proc for preliminary results.   Stephene Alegria, RVT 11/21/2023, 12:57 PM

## 2023-11-21 NOTE — Plan of Care (Signed)

## 2023-11-21 NOTE — Plan of Care (Signed)
  Problem: Nutrition: Goal: Adequate nutrition will be maintained Outcome: Progressing   Problem: Activity: Goal: Risk for activity intolerance will decrease Outcome: Progressing   Problem: Coping: Goal: Level of anxiety will decrease Outcome: Progressing   Problem: Pain Managment: Goal: General experience of comfort will improve and/or be controlled Outcome: Progressing

## 2023-11-21 NOTE — Progress Notes (Signed)
 ANTICOAGULATION CONSULT NOTE  Pharmacy Consult for Warfarin Indication: atrial fibrillation  Allergies  Allergen Reactions   Amoxicillin Swelling    penile swelling   Lisinopril Cough   Kenalog  [Triamcinolone ] Rash    Patient Measurements: Height: 5' 4 (162.6 cm) Weight: 66.7 kg (147 lb) IBW/kg (Calculated) : 59.2  Vital Signs: Temp: 98.8 F (37.1 C) (09/20 0752) Temp Source: Oral (09/20 0752) BP: 122/60 (09/20 0752) Pulse Rate: 71 (09/20 0752)  Labs: Recent Labs    11/20/23 1356 11/20/23 1404 11/20/23 1826 11/21/23 0749  HGB 8.6* 8.2*  --  8.7*  HCT 26.8* 24.0*  --  26.7*  PLT 304  --   --  335  LABPROT  --   --  23.4* 25.4*  INR  --   --  2.0* 2.2*  CREATININE 1.13 1.10  --  1.01    Estimated Creatinine Clearance: 37.4 mL/min (by C-G formula based on SCr of 1.01 mg/dL).   Medical History: Past Medical History:  Diagnosis Date   Acute CHF (HCC) 05/06/2013   Acute respiratory failure with hypoxia (HCC) 04/24/2023   Aortic stenosis    a. s/p tissue AVR 05/2013;  b. Echo (06/2013):  Mod LVH, EF 60-65%, no RWMA, Gr 2 DD, AVR ok (mean 13 mmHg), MAC, mild MR, mod LAE, mild RAE, PASP 46 mmHg (mild pulmo HTN)   Atrial fibrillation with RVR (HCC) 05/06/2013   Blood loss anemia    a. 05/2009 a/w GIB.   CAD (coronary artery disease)    a. Cath 04/2009: BMS to LCx 04/2009; CTO of RCA with L-R collaterals, 40% ostial diag.   Carpal tunnel syndrome    bilateral, carpal tunnel release x 2   Esophageal ulcer    a. 05/2009 with hemorrhage - injected with epinephrine -Dr. Lamar Bunk.   Esophagitis    a. 05/2009 a/w GIB.   GERD (gastroesophageal reflux disease)    EGD, Dr. Quay 2008   GI bleed    a. 05/2009 as above: with associated anemia. Lower esophageal ulcer with extensive erosive esophagitis and large clot by EGD 05/2009.    Hx of colonoscopy 2008   Dr. Cindy, polyps- 5 yr f/u recommended   Hyperlipidemia    Hypertension    Influenza A  04/24/2023   PAF (paroxysmal atrial fibrillation) (HCC)    a. Post-op AVR;  b. 04/2014 recurrent.   Perforated ear drum    right ear drum, tube in left ear drum, Dr. Jesus   Polymyalgia rheumatica [facility])    Prostate cancer (HCC)    Vitamin D  deficiency     Medications:  Medications Prior to Admission  Medication Sig Dispense Refill Last Dose/Taking   acetaminophen  (TYLENOL ) 325 MG tablet Take 650 mg by mouth every 6 (six) hours as needed for moderate pain.   11/20/2023 Morning   Ascorbic Acid (VITAMIN C PO) Take 1 tablet by mouth daily.   11/20/2023 Morning   Cholecalciferol  (DIALYVITE VITAMIN D  5000) 125 MCG (5000 UT) capsule Take 5,000 Units by mouth 2 (two) times a week. Mondays and Fridays   11/20/2023 Morning   digoxin  (LANOXIN ) 0.125 MG tablet Take 1 tablet (0.125 mg total) by mouth every evening.   11/19/2023 Evening   furosemide  (LASIX ) 80 MG tablet Take 1 tablet (80 mg total) by mouth 2 (two) times daily. (Patient taking differently: Take 80 mg by mouth 2 (two) times daily as needed for fluid.)   11/20/2023 Morning   Lactobacillus (PROBIOTIC ACIDOPHILUS) CAPS Take 1 capsule by mouth in  the morning and at bedtime for 10 days. 20 capsule 0 11/20/2023 Morning   metolazone  (ZAROXOLYN ) 2.5 MG tablet Take 1 tablet (2.5 mg total) by mouth once a week. (Patient taking differently: Take 2.5 mg by mouth once a week. Mondays) 12 tablet 2 11/16/2023   metoprolol  succinate (TOPROL -XL) 50 MG 24 hr tablet Take 1 tablet (50 mg total) by mouth daily. Take with or immediately following a meal. 180 tablet 1 11/20/2023 Morning   mupirocin  ointment (BACTROBAN ) 2 % Apply 1 Application topically 2 (two) times daily.   11/20/2023 Morning   pantoprazole  (PROTONIX ) 40 MG tablet Take 40 mg by mouth daily.   11/20/2023 Morning   warfarin (COUMADIN ) 5 MG tablet TAKE 1 TABLET DAILY AS DIRECTED BY THE COUMADIN  CLINIC. (Patient taking differently: Take 5 mg by mouth daily.) 100 tablet 3 11/19/2023 at  4:00 PM   leptospermum  manuka honey (MEDIHONEY) PSTE paste Apply 1 Application topically daily for 7 days. Interchangeable with TheraHoney  Cleanse trauma wound to right posterior leg with NS and pat dry Apply medihoney to wound bed to facilitate debridement. Cover with dry dressing. Change daily. Apply thin layer (3 mm) to wound. 88 mL 0    Scheduled:   [START ON 11/23/2023] cholecalciferol   5,000 Units Oral Once per day on Monday Thursday   digoxin   0.125 mg Oral QPM   furosemide   80 mg Oral BID   Influenza vac split trivalent PF  0.5 mL Intramuscular Tomorrow-1000   [START ON 11/23/2023] metolazone   2.5 mg Oral Q Mon   metoprolol  succinate  50 mg Oral Daily   pantoprazole   40 mg Oral Daily   sodium chloride  flush  3 mL Intravenous Q12H   warfarin  5 mg Oral q1600   Warfarin - Pharmacist Dosing Inpatient   Does not apply q1600   Infusions:   sodium chloride      vancomycin      PRN: sodium chloride , acetaminophen  **OR** acetaminophen , albuterol , hydrALAZINE , ondansetron  **OR** ondansetron  (ZOFRAN ) IV, polyethylene glycol, sodium chloride  flush  Assessment: 89 yom with a history of HTN, HLD, AF on coumadin , CAD s/p CABG, aortic stenosis s/p AVR, PPM, HFpEF. Patient presenting with wound infection. . Warfarin per pharmacy consult placed for atrial fibrillation.  Patient taking warfarin prior to arrival. Home dose is 5 mg daily. Last taken 9/18 4pm per med history.  INR today is 2.2, which is therapeutic Hgb 8.7; plt 335  Goal of Therapy:  INR Goal 2-3 Monitor platelets by anticoagulation protocol: Yes   Plan:  Continue warfarin 5mg  daily Monitor for s/s of hemorrhage, daily INR, CBC Watch for new DDIs  Donny Alert, PharmD, [facility] Clinical Pharmacist Please see AMION for all Pharmacists' Contact Phone Numbers 11/21/2023, 9:21 AM

## 2023-11-21 NOTE — Evaluation (Signed)
 Physical Therapy Evaluation Patient Details Name: Robert [name] MRN: [id number] DOB: [birth year] Today's Date: 11/21/2023  History of Present Illness  Pt is a 89 y.o. male admitted 9/19 with worsening RLE wound infection. Recent admit earlier thismonth with same diagnosis PMH:  HTN, hyperlipidemia, GERD, CAD s/p CABG, a fib, tachybradycardia syndrome, s/p PPM, aortic stenosis s/p AVR, chronic combined systolic and diastolic CHF, prostate cancer, anemia. Recent fall off commode when bending over  Clinical Impression  Pt admitted with above diagnosis. Pt is at baseline level of mobility. No acute care PT needs. Recommend pt continue to ambulate in the hallways with staff to prevent hospital acquired debility.  Pt and family in agreement with this plan. I have answered all patient's question regarding PT and mobility.         If plan is discharge home, recommend the following: Assistance with cooking/housework   Can travel by private vehicle        Equipment Recommendations None recommended by PT  Recommendations for Other Services       Functional Status Assessment Patient has not had a recent decline in their functional status     Precautions / Restrictions Precautions Precautions: Fall Restrictions Weight Bearing Restrictions Per Provider Order: No      Mobility  Bed Mobility Overal bed mobility:  (NT-sitting EOB when PT arrived)                  Transfers Overall transfer level: Needs assistance Equipment used: Rollator (4 wheels) Transfers: Sit to/from Stand Sit to Stand: Supervision           General transfer comment: Cues for safest technique    Ambulation/Gait Ambulation/Gait assistance: Supervision Gait Distance (Feet): 150 Feet Assistive device: Rollator (4 wheels) Gait Pattern/deviations: Step-through pattern, Decreased stride length Gait velocity: decreased Gait velocity interpretation: <1.31 ft/sec, indicative of household ambulator    General Gait Details: steady gait with rollator  Stairs            Wheelchair Mobility     Tilt Bed    Modified Rankin (Stroke Patients Only)       Balance Overall balance assessment: Needs assistance         Standing balance support: No upper extremity supported Standing balance-Leahy Scale: Poor                               Pertinent Vitals/Pain Pain Assessment Pain Assessment: No/denies pain    Home Living Family/patient expects to be discharged to:: Private residence Living Arrangements: Spouse/significant other Available Help at Discharge: Family;Available 24 hours/day Type of Home: House Home Access: Elevator     Alternate Level Stairs-Number of Steps: elevator Home Layout: Multi-level Home Equipment: Rollator (4 wheels);Shower seat - built in Additional Comments: Pt lives with wife.  Most information taken from recent visit    Prior Function Prior Level of Function : Needs assist               ADLs Comments: wife does iADLs, mod I basic ADLs     Extremity/Trunk Assessment   Upper Extremity Assessment Upper Extremity Assessment: Overall WFL for tasks assessed    Lower Extremity Assessment Lower Extremity Assessment: Overall WFL for tasks assessed RLE Deficits / Details: several wounds to RLE    Cervical / Trunk Assessment Cervical / Trunk Assessment: Kyphotic  Communication   Communication Communication: Impaired Factors Affecting Communication: Hearing impaired    Cognition Arousal: Alert  Behavior During Therapy: WFL for tasks assessed/performed   PT - Cognitive impairments: No apparent impairments                         Following commands: Intact       Cueing       General Comments General comments (skin integrity, edema, etc.): Wife and 2 daughters present    Exercises     Assessment/Plan    PT Assessment Patient does not need any further PT services  PT Problem List         PT  Treatment Interventions      PT Goals (Current goals can be found in the Care Plan section)  Acute Rehab PT Goals Patient Stated Goal: to return home PT Goal Formulation: All assessment and education complete, DC therapy    Frequency       Co-evaluation               AM-PAC PT 6 Clicks Mobility  Outcome Measure Help needed turning from your back to your side while in a flat bed without using bedrails?: None Help needed moving from lying on your back to sitting on the side of a flat bed without using bedrails?: None Help needed moving to and from a bed to a chair (including a wheelchair)?: A Little Help needed standing up from a chair using your arms (e.g., wheelchair or bedside chair)?: A Little Help needed to walk in hospital room?: A Little Help needed climbing 3-5 steps with a railing? : A Little 6 Click Score: 20    End of Session Equipment Utilized During Treatment: Gait belt Activity Tolerance: Patient tolerated treatment well Patient left: in bed;with call bell/phone within reach;with family/visitor present Nurse Communication: Mobility status PT Visit Diagnosis: Unsteadiness on feet (R26.81)    Time: 8889-8874 PT Time Calculation (min) (ACUTE ONLY): 15 min   Charges:   PT Evaluation $PT Eval Low Complexity: 1 Low   PT General Charges $$ ACUTE PT VISIT: 1 Visit         Oneil Schick, PT   Acute Rehabilitation Services  Office [phone] 11/21/2023   Schick Oneil PARAS 11/21/2023, 11:52 AM

## 2023-11-21 NOTE — Consult Note (Signed)
 WOC team consulted for RLE ulcerations for which patient has been using Medihoney at home. [name] has consulted on this patient (see note 11/21/2023) and written for ortho tech to place unna boots.   WOC team will sign off. Reconsult if further needs arise.   Thank you,    Powell Bar MSN, RN-BC, [employer]

## 2023-11-21 NOTE — Progress Notes (Signed)
 OT Cancellation Note  Patient Details Name: DE JAWORSKI MRN: [id number] DOB: 1928-01-05   Cancelled Treatment:    Reason Eval/Treat Not Completed: OT screened, no needs identified, will sign off (Pt not in room. Spoke with fmaily who states pt is close to baseline. No DME needs. OT signing off.)  Aima Mcwhirt,HILLARY 11/21/2023, 12:21 PM Kreg Sink, OT/L   Acute OT Clinical Specialist Acute Rehabilitation Services Pager [phone] Office [phone]

## 2023-11-21 NOTE — Progress Notes (Signed)
 PROGRESS NOTE  Robert [name]  FMW:[id number] DOB: 1928/02/19 DOA: 11/20/2023 PCP: [name], [name]  E, PA  Consultants  Brief Narrative: 89 y.o. male with medical history significant Patient is a 89 y.o.  male with history of HTN, HLD, A-fib on Coumadin , CAD s/p CABG, aortic stenosis-s/p aortic valve replacement, PPM implantation, chronic HFpEF who was recently hospitalized and then transition to hospital at home program-discharged on 9/6 for RLE ulcer with cellulitis-presented to the hospital with worsening right leg swelling/erythema and development of 2 additional ulcers.   Per history obtained-since this past Monday (9/15)-he developed 2 additional ulcers in his right leg-since then he has had some greenish appearing discharge from these ulcers.  His right leg has become more erythematous and swollen compared to the past few days.  He was seen by his primary care practitioner-superficial cultures were obtained-a referral to [name] was placed-he was subsequently restarted on an oral antibiotic.  Due to continued worsening of his right leg wound/erythema/swelling-he presented to the ED for evaluation-subsequently the hospitalist service was asked to admit this patient for further evaluation and treatment   Assessment & Plan: RLE ulceration with cellulitis 2 additional new ulcers in the past 1 week - [name] consulted, appreciate input.  - ABI reads still pending - Question of traumatic venous insufficiency with possible arterial as well. - DuoNeb compression wrap ordered by Ortho. - Patient/family are very keen on going home with hospital at home program--they would prefer to continue IV antibiotics at home through this program when ready for discharge.  Leukocytosis: - Noted, increased today. - Continue IV antibiotics. -  as above patient went preferred to discharge home with IV antibiotics through hospital at home program.   PAF Sinus rhythm Continue digoxin /metoprolol  INR  therapeutic-warfarin per pharmacy   History of tachybradycardia syndrome Pacemaker in place   CAD s/p CABG No anginal symptoms Continue statin/beta-blocker Suspect not on ASA as patient on Coumadin    History of aortic stenosis-s/p aortic valve replacement Supportive care   Chronic HFpEF Euvolemic-RLE swelling is not from CHF or from cellulitis/soft tissue infection Continue furosemide /beta-blocker.   HTN BP stable Continue metoprolol /Lasix     Normocytic anemia Secondary to chronic disease No evidence of blood loss Trend CBC   History of prostate cancer Lupron  every 6 months.    DVT prophylaxis:   warfarin (COUMADIN ) tablet 5 mg  Code Status:   Code Status: Limited: Do not attempt resuscitation (DNR) -DNR-LIMITED -Do Not Intubate/DNI  Family Communication: Wife and daughter in room and all questions answered Level of care: Med-Surg Status is: Inpatient   Consults called: Orthopedics  Subjective: Patient lying in bed, no complaints today.  Reports leg pain is good.  Eating and drinking well  Objective: Vitals:   11/20/23 1922 11/21/23 0334 11/21/23 0752 11/21/23 1530  BP: (!) 109/55 136/69 122/60 (!) 114/50  Pulse: 70 69 71 61  Resp: 16 16 18 18   Temp: 97.8 F (36.6 C) 97.7 F (36.5 C) 98.8 F (37.1 C) 98.3 F (36.8 C)  TempSrc: Oral Oral Oral Oral  SpO2: 100% 97% 94% 95%  Weight:      Height:        Intake/Output Summary (Last 24 hours) at 11/21/2023 1617 Last data filed at 11/21/2023 1500 Gross per 24 hour  Intake 250 ml  Output 675 ml  Net -425 ml   Filed Weights   11/20/23 1352  Weight: 66.7 kg   Body mass index is 25.23 kg/m.  Gen: 89 y.o. male in no  apparent distress.  Nontoxic Pulm: Non-labored breathing.  Clear to auscultation bilaterally.  CV: Regular rate and rhythm.  GI: Abdomen soft, non-tender, non-distended Ext: Warm, no deformities, no pedal edema Skin: With old abrasions, bandages in place across forehead, bilateral upper  extremities.  With multiple ulcerations medially and laterally and black eschar right lower extremity. Neuro: Alert and oriented. No focal neurological deficits. Psych: Calm  Judgement and insight appear normal. Mood & affect appropriate.    I have personally reviewed the following labs and images: CBC: Recent Labs  Lab 11/20/23 1356 11/20/23 1404 11/21/23 0749  WBC 15.7*  --  17.0*  NEUTROABS 7.2  --   --   HGB 8.6* 8.2* 8.7*  HCT 26.8* 24.0* 26.7*  MCV 97.8  --  97.4  PLT 304  --  335   BMP &GFR Recent Labs  Lab 11/20/23 1356 11/20/23 1404 11/21/23 0749  NA 138 136 138  K 3.6 3.6 3.7  CL 95* 95* 96*  CO2 29  --  29  GLUCOSE 108* 107* 110*  BUN 41* 38* 35*  CREATININE 1.13 1.10 1.01  CALCIUM  9.0  --  8.9   Estimated Creatinine Clearance: 37.4 mL/min (by C-G formula based on SCr of 1.01 mg/dL). Liver & Pancreas: Recent Labs  Lab 11/20/23 1356  AST 21  ALT 18  ALKPHOS 33*  BILITOT 1.0  PROT 6.3*  ALBUMIN  3.3*   No results for input(s): LIPASE, AMYLASE in the last 168 hours. No results for input(s): AMMONIA in the last 168 hours. Diabetic: No results for input(s): HGBA1C in the last 72 hours. No results for input(s): GLUCAP in the last 168 hours. Cardiac Enzymes: No results for input(s): CKTOTAL, CKMB, CKMBINDEX, TROPONINI in the last 168 hours. No results for input(s): PROBNP in the last 8760 hours. Coagulation Profile: Recent Labs  Lab 11/20/23 1826 11/21/23 0749  INR 2.0* 2.2*   Thyroid  Function Tests: No results for input(s): TSH, T4TOTAL, FREET4, T3FREE, THYROIDAB in the last 72 hours. Lipid Profile: No results for input(s): CHOL, HDL, LDLCALC, TRIG, CHOLHDL, LDLDIRECT in the last 72 hours. Anemia Panel: No results for input(s): VITAMINB12, FOLATE, FERRITIN, TIBC, IRON, RETICCTPCT in the last 72 hours. Urine analysis:    Component Value Date/Time   COLORURINE YELLOW 04/24/2023 1419    APPEARANCEUR CLEAR 04/24/2023 1419   LABSPEC 1.012 04/24/2023 1419   PHURINE 6.0 04/24/2023 1419   GLUCOSEU NEGATIVE 04/24/2023 1419   HGBUR SMALL (A) 04/24/2023 1419   BILIRUBINUR NEGATIVE 04/24/2023 1419   KETONESUR NEGATIVE 04/24/2023 1419   PROTEINUR NEGATIVE 04/24/2023 1419   UROBILINOGEN 1.0 05/11/2013 2340   NITRITE NEGATIVE 04/24/2023 1419   LEUKOCYTESUR NEGATIVE 04/24/2023 1419   Sepsis Labs: Invalid input(s): PROCALCITONIN, LACTICIDVEN  Microbiology: No results found for this or any previous visit (from the past 240 hours).  Radiology Studies: VAS US  ABI WITH/WO TBI Result Date: 11/21/2023  LOWER EXTREMITY DOPPLER STUDY Patient Name:  Robert [name]  Date of Exam:   11/21/2023 Medical Rec #: [id number]             Accession #:    [id number] Date of Birth: May 16, 1927             Patient Gender: M Patient Age:   24 years Exam Location:  [facility] Procedure:      VAS US  ABI WITH/WO TBI Referring Phys: DONALDA APPLEBAUM --------------------------------------------------------------------------------  Indications: Traumatic venous ulceration status post fall. High Risk Factors: Hypertension, hyperlipidemia, no history of smoking, coronary  artery disease. Other Factors: Venous insufficiency, right > left. Pacemaker. Atrial                fibrillation/flutter. CHF. History of aortic valve replacement                2016  Limitations: Today's exam was limited due to involuntary patient movement and              Arrythmia. Comparison Study: No prior study on file Performing Technologist: Rachel Pellet RVS  Examination Guidelines: A complete evaluation includes at minimum, Doppler waveform signals and systolic blood pressure reading at the level of bilateral brachial, anterior tibial, and posterior tibial arteries, when vessel segments are accessible. Bilateral testing is considered an integral part of a complete examination. Photoelectric Plethysmograph (PPG)  waveforms and toe systolic pressure readings are included as required and additional duplex testing as needed. Limited examinations for reoccurring indications may be performed as noted.  ABI Findings: +---------+------------------+-----+-----------+--------+ Right    Rt Pressure (mmHg)IndexWaveform   Comment  +---------+------------------+-----+-----------+--------+ Brachial 139                    triphasic           +---------+------------------+-----+-----------+--------+ PTA      240               1.69 multiphasic         +---------+------------------+-----+-----------+--------+ DP       231               1.63 multiphasic         +---------+------------------+-----+-----------+--------+ Great Toe70                0.49 Abnormal            +---------+------------------+-----+-----------+--------+ +---------+------------------+-----+-----------+-------+ Left     Lt Pressure (mmHg)IndexWaveform   Comment +---------+------------------+-----+-----------+-------+ Brachial 142                    triphasic          +---------+------------------+-----+-----------+-------+ PTA      254               1.79 multiphasic        +---------+------------------+-----+-----------+-------+ DP       251               1.77 multiphasic        +---------+------------------+-----+-----------+-------+ Great Toe67                0.47 Abnormal           +---------+------------------+-----+-----------+-------+ +-------+---------------------+-----------+------------+------------+ ABI/TBIToday's ABI          Today's TBIPrevious ABIPrevious TBI +-------+---------------------+-----------+------------+------------+ Right  1.69/non compressible0.49                                +-------+---------------------+-----------+------------+------------+ Left   1.79/non compressible0.47                                 +-------+---------------------+-----------+------------+------------+  Arterial wall calcification precludes accurate ankle pressures and ABIs.  Summary: Right: Resting right ankle-brachial index indicates noncompressible right lower extremity arteries. The right toe-brachial index is abnormal.  Left: Resting left ankle-brachial index indicates noncompressible left lower extremity arteries. The left toe-brachial index is abnormal.  *See table(s) above for measurements and observations.     Preliminary  Scheduled Meds:  [START ON 11/23/2023] cholecalciferol   5,000 Units Oral Once per day on Monday Thursday   digoxin   0.125 mg Oral QPM   furosemide   80 mg Oral BID   Influenza vac split trivalent PF  0.5 mL Intramuscular Tomorrow-1000   [START ON 11/23/2023] metolazone   2.5 mg Oral Q Mon   metoprolol  succinate  50 mg Oral Daily   pantoprazole   40 mg Oral Daily   sodium chloride  flush  3 mL Intravenous Q12H   warfarin  5 mg Oral q1600   Warfarin - Pharmacist Dosing Inpatient   Does not apply q1600   Continuous Infusions:  sodium chloride      vancomycin        LOS: 1 day   35 minutes with more than 50% spent in reviewing records, counseling patient/family and coordinating care.  Reyes VEAR Gaw, MD [facility] [email or web address] 11/21/2023, 4:17 PM

## 2023-11-21 NOTE — Consult Note (Signed)
 ORTHOPAEDIC CONSULTATION  REQUESTING PHYSICIAN: Elpidio Reyes DEL, MD  Chief Complaint: Ulcerations right lower extremity.  HPI: Robert [name] is a 89 y.o. male who presents with mixed venous and arterial insufficiency ulceration right lower extremity.  Patient states he recently lost his balance in the bathroom and fell.  Past Medical History:  Diagnosis Date   Acute CHF (HCC) 05/06/2013   Acute respiratory failure with hypoxia (HCC) 04/24/2023   Aortic stenosis    a. s/p tissue AVR 05/2013;  b. Echo (06/2013):  Mod LVH, EF 60-65%, no RWMA, Gr 2 DD, AVR ok (mean 13 mmHg), MAC, mild MR, mod LAE, mild RAE, PASP 46 mmHg (mild pulmo HTN)   Atrial fibrillation with RVR (HCC) 05/06/2013   Blood loss anemia    a. 05/2009 a/w GIB.   CAD (coronary artery disease)    a. Cath 04/2009: BMS to LCx 04/2009; CTO of RCA with L-R collaterals, 40% ostial diag.   Carpal tunnel syndrome    bilateral, carpal tunnel release x 2   Esophageal ulcer    a. 05/2009 with hemorrhage - injected with epinephrine -Dr. Lamar Bunk.   Esophagitis    a. 05/2009 a/w GIB.   GERD (gastroesophageal reflux disease)    EGD, Dr. Quay 2008   GI bleed    a. 05/2009 as above: with associated anemia. Lower esophageal ulcer with extensive erosive esophagitis and large clot by EGD 05/2009.    Hx of colonoscopy 2008   Dr. Cindy, polyps- 5 yr f/u recommended   Hyperlipidemia    Hypertension    Influenza A 04/24/2023   PAF (paroxysmal atrial fibrillation) (HCC)    a. Post-op AVR;  b. 04/2014 recurrent.   Perforated ear drum    right ear drum, tube in left ear drum, Dr. Jesus   Polymyalgia rheumatica [facility])    Prostate cancer [facility])    Vitamin D  deficiency    Past Surgical History:  Procedure Laterality Date   AORTIC VALVE REPLACEMENT N/A 05/12/2013   Procedure: AORTIC VALVE REPLACEMENT (AVR);  Surgeon: Maude Fleeta Ochoa, MD;  Location: [facility] OR;  Service: Open Heart Surgery;  Laterality: N/A;    CARDIAC CATHETERIZATION  2011   CARDIOVERSION N/A 04/12/2020   Procedure: CARDIOVERSION;  Surgeon: Maranda Leim DEL, MD;  Location: [facility] ENDOSCOPY;  Service: Cardiovascular;  Laterality: N/A;   CORONARY ARTERY BYPASS GRAFT N/A 05/12/2013   Procedure: CORONARY ARTERY BYPASS GRAFTING (CABG);  Surgeon: Maude Fleeta Ochoa, MD;  Location: [facility] OR;  Service: Open Heart Surgery;  Laterality: N/A;  CABG x 1 using left leg greater saphenous vein harvested endoscopically   INTRAOPERATIVE TRANSESOPHAGEAL ECHOCARDIOGRAM N/A 05/12/2013   Procedure: INTRAOPERATIVE TRANSESOPHAGEAL ECHOCARDIOGRAM;  Surgeon: Maude Fleeta Ochoa, MD;  Location: [facility] OR;  Service: Open Heart Surgery;  Laterality: N/A;   LEFT AND RIGHT HEART CATHETERIZATION WITH CORONARY ANGIOGRAM N/A 05/09/2013   Procedure: LEFT AND RIGHT HEART CATHETERIZATION WITH CORONARY ANGIOGRAM;  Surgeon: Dorn JINNY Lesches, MD;  Location: [facility] CATH LAB;  Service: Cardiovascular;  Laterality: N/A;   MAZE N/A 05/12/2013   Procedure: MAZE;  Surgeon: Maude Fleeta Ochoa, MD;  Location: [facility] OR;  Service: Open Heart Surgery;  Laterality: N/A;   PACEMAKER IMPLANT N/A 05/21/2020   Procedure: PACEMAKER IMPLANT;  Surgeon: Waddell Danelle ORN, MD;  Location: [facility] INVASIVE CV LAB;  Service: Cardiovascular;  Laterality: N/A;   PROSTATECTOMY     TEE WITHOUT CARDIOVERSION N/A 04/12/2020   Procedure: TRANSESOPHAGEAL ECHOCARDIOGRAM (TEE);  Surgeon: Maranda Leim DEL, MD;  Location: [facility]  ENDOSCOPY;  Service: Cardiovascular;  Laterality: N/A;   Social History   Socioeconomic History   Marital status: Married    Spouse name: Not on file   Number of children: Not on file   Years of education: Not on file   Highest education level: Not on file  Occupational History   Not on file  Tobacco Use   Smoking status: Never   Smokeless tobacco: Never  Vaping Use   Vaping status: Never Used  Substance and Sexual Activity   Alcohol use: No   Drug use: No   Sexual activity: Not on file  Other Topics Concern    Not on file  Social History Narrative   Not on file   Social Drivers of Health   Financial Resource Strain: Not on file  Food Insecurity: No Food Insecurity (11/20/2023)   Hunger Vital Sign    Worried About Running Out of Food in the Last Year: Never true    Ran Out of Food in the Last Year: Never true  Transportation Needs: No Transportation Needs (11/20/2023)   PRAPARE - Administrator, Civil Service (Medical): No    Lack of Transportation (Non-Medical): No  Physical Activity: Not on file  Stress: Not on file  Social Connections: Moderately Isolated (11/20/2023)   Social Connection and Isolation Panel    Frequency of Communication with Friends and Family: More than three times a week    Frequency of Social Gatherings with Friends and Family: More than three times a week    Attends Religious Services: Never    Database administrator or Organizations: No    Attends Engineer, structural: Never    Marital Status: Married   Family History  Problem Relation Age of Onset   CVA Mother    Kidney disease Father    Breast cancer Daughter    - negative except otherwise stated in the family history section Allergies  Allergen Reactions   Amoxicillin Swelling    penile swelling   Lisinopril Cough   Kenalog  [Triamcinolone ] Rash   Prior to Admission medications   Medication Sig Start Date End Date Taking? Authorizing Provider  acetaminophen  (TYLENOL ) 325 MG tablet Take 650 mg by mouth every 6 (six) hours as needed for moderate pain.   Yes [provider]  Ascorbic Acid (VITAMIN C PO) Take 1 tablet by mouth daily.   Yes [provider]  Cholecalciferol  (DIALYVITE VITAMIN D  5000) 125 MCG (5000 UT) capsule Take 5,000 Units by mouth 2 (two) times a week. Mondays and Fridays   Yes [provider]  digoxin  (LANOXIN ) 0.125 MG tablet Take 1 tablet (0.125 mg total) by mouth every evening. 04/25/23  Yes Arlon Carliss ORN, DO  furosemide  (LASIX ) 80 MG  tablet Take 1 tablet (80 mg total) by mouth 2 (two) times daily. Patient taking differently: Take 80 mg by mouth 2 (two) times daily as needed for fluid. 09/18/23  Yes Croitoru, Mihai, MD  Lactobacillus (PROBIOTIC ACIDOPHILUS) CAPS Take 1 capsule by mouth in the morning and at bedtime for 10 days. 11/07/23 11/20/23 Yes Laurence Locus, DO  metolazone  (ZAROXOLYN ) 2.5 MG tablet Take 1 tablet (2.5 mg total) by mouth once a week. Patient taking differently: Take 2.5 mg by mouth once a week. Mondays 11/11/23  Yes Chandrasekhar, Mahesh A, MD  metoprolol  succinate (TOPROL -XL) 50 MG 24 hr tablet Take 1 tablet (50 mg total) by mouth daily. Take with or immediately following a meal. 09/25/23  Yes Duke,  Jon Garre, PA  mupirocin  ointment (BACTROBAN ) 2 % Apply 1 Application topically 2 (two) times daily. 10/29/23  Yes [provider]  pantoprazole  (PROTONIX ) 40 MG tablet Take 40 mg by mouth daily. 03/13/23  Yes [provider]  warfarin (COUMADIN ) 5 MG tablet TAKE 1 TABLET DAILY AS DIRECTED BY THE COUMADIN  CLINIC. Patient taking differently: Take 5 mg by mouth daily. 12/30/22  Yes Dunn, Dayna N, PA-C  leptospermum manuka honey (MEDIHONEY) PSTE paste Apply 1 Application topically daily for 7 days. Interchangeable with TheraHoney  Cleanse trauma wound to right posterior leg with NS and pat dry Apply medihoney to wound bed to facilitate debridement. Cover with dry dressing. Change daily. Apply thin layer (3 mm) to wound. 11/07/23 11/14/23  Laurence Locus, DO   No results found. - pertinent xrays, CT, MRI studies were reviewed and independently interpreted  Positive ROS: All other systems have been reviewed and were otherwise negative with the exception of those mentioned in the HPI and as above.  Physical Exam: General: Alert, no acute distress Psychiatric: Patient is competent for consent with normal mood and affect Lymphatic: No axillary or cervical lymphadenopathy Cardiovascular: No pedal  edema Respiratory: No cyanosis, no use of accessory musculature GI: No organomegaly, abdomen is soft and non-tender    Images:  @ENCIMAGES @  Labs:  Lab Results  Component Value Date   HGBA1C 5.8 (H) 11/07/2023   HGBA1C 5.7 (H) 05/10/2013   HGBA1C 5.7 (H) 05/06/2013   ESRSEDRATE 63 (H) 11/07/2023   ESRSEDRATE 65 (H) 11/02/2023   CRP 2.8 (H) 11/07/2023   CRP 7.6 (H) 11/02/2023   REPTSTATUS 11/07/2023 FINAL 11/02/2023   GRAMSTAIN  12/28/2007    FEW WBC PRESENT,BOTH PMN AND MONONUCLEAR NO SQUAMOUS EPITHELIAL CELLS SEEN NO ORGANISMS SEEN   GRAMSTAIN  12/28/2007    FEW WBC PRESENT,BOTH PMN AND MONONUCLEAR NO SQUAMOUS EPITHELIAL CELLS SEEN NO ORGANISMS SEEN   CULT  11/02/2023    NO GROWTH 5 DAYS Performed at [facility], [address] [place], [place] [address]     Lab Results  Component Value Date   ALBUMIN  3.3 (L) 11/20/2023   ALBUMIN  3.5 11/03/2023   ALBUMIN  3.9 11/02/2023   PREALBUMIN 18.3 05/10/2013        Latest Ref Rng & Units 11/20/2023    2:04 PM 11/20/2023    1:56 PM 11/07/2023   10:38 AM  CBC EXTENDED  WBC 4.0 - 10.5 K/uL  15.7  12.8   RBC 4.22 - 5.81 MIL/uL  2.74  3.07   Hemoglobin 13.0 - 17.0 g/dL 8.2  8.6  9.8   HCT 60.9 - 52.0 % 24.0  26.8  30.5   Platelets 150 - 400 K/uL  304  242   NEUT# 1.7 - 7.7 K/uL  7.2    Lymph# 0.7 - 4.0 K/uL  7.9      Neurologic: Patient does not have protective sensation bilateral lower extremities.   MUSCULOSKELETAL:   Skin: Examination patient has venous insufficiency of the right lower extremity worse than the left with pitting edema and swelling.  There are multiple ulcerations medially and laterally with a thin black eschar.  These have the appearance of traumatic venous ulcerations.  I cannot palpate a dorsalis pedis pulse but patient does have a strong femoral pulse.  Ankle-brachial indices are ordered and pending.  White cell count increased at 17,000.  Hemoglobin 8.7.  INR  2.2.  Assessment: Assessment: Traumatic venous insufficiency ulceration right lower extremity possible mixed arterial  venous insufficiency.  Plan: Plan: I will place an order for an Unna compression wrap for the right lower extremity.  This may be removed and changed for his ankle-brachial indices.  I will follow-up in the office after discharge.  Thank you for the consult and the opportunity to see Mr. Robert Dobek, MD [facility] [phone] 9:04 AM

## 2023-11-21 NOTE — Progress Notes (Signed)
 PT Cancellation Note  Patient Details Name: Robert [name] MRN: [id number] DOB: [birth year]   Cancelled Treatment:    Reason Eval/Treat Not Completed: Other (comment) (Pt had just returned to bed and wated to keep leg elevated). Pt's daughter and wife present. They report no changes in his functional status and are aware of importance of mobilizing while in the hospital to prevent debility. Pt for test today and Unna boot application.  May attempt to see pt again this afternoon, and more likely will re-attempt tomorrow. Pt/family in agreement with that plan.       Delos Oneil PARAS 11/21/2023, 10:07 AM Oneil Delos, PT   Acute Rehabilitation Services  Office [phone] 11/21/2023

## 2023-11-21 NOTE — Progress Notes (Signed)
 Orthopedic Tech Progress Note Patient Details:  Robert [name] 1927-12-14 [id number]  Ortho Devices Type of Ortho Device: Radio broadcast assistant Ortho Device/Splint Location: RLE Ortho Device/Splint Interventions: Ordered, Application, Adjustment   Post Interventions Patient Tolerated: Well Instructions Provided: Care of device, Adjustment of device, Poper ambulation with device Paged by rn to apply unna boot, applied unna boot.  Camellia Bo 11/21/2023, 5:14 PM

## 2023-11-22 DIAGNOSIS — L03115 Cellulitis of right lower limb: Secondary | ICD-10-CM | POA: Diagnosis not present

## 2023-11-22 LAB — CBC
HCT: 25.9 % — ABNORMAL LOW (ref 39.0–52.0)
Hemoglobin: 8.5 g/dL — ABNORMAL LOW (ref 13.0–17.0)
MCH: 31.8 pg (ref 26.0–34.0)
MCHC: 32.8 g/dL (ref 30.0–36.0)
MCV: 97 fL (ref 80.0–100.0)
Platelets: 290 K/uL (ref 150–400)
RBC: 2.67 MIL/uL — ABNORMAL LOW (ref 4.22–5.81)
RDW: 16.7 % — ABNORMAL HIGH (ref 11.5–15.5)
WBC: 13.3 K/uL — ABNORMAL HIGH (ref 4.0–10.5)
nRBC: 0 % (ref 0.0–0.2)

## 2023-11-22 LAB — BASIC METABOLIC PANEL WITH GFR
Anion gap: 12 (ref 5–15)
BUN: 29 mg/dL — ABNORMAL HIGH (ref 8–23)
CO2: 29 mmol/L (ref 22–32)
Calcium: 8.6 mg/dL — ABNORMAL LOW (ref 8.9–10.3)
Chloride: 98 mmol/L (ref 98–111)
Creatinine, Ser: 0.91 mg/dL (ref 0.61–1.24)
GFR, Estimated: >60 mL/min (ref >60)
Glucose, Bld: 125 mg/dL — ABNORMAL HIGH (ref 70–99)
Potassium: 3.5 mmol/L (ref 3.5–5.1)
Sodium: 139 mmol/L (ref 135–145)

## 2023-11-22 LAB — VAS US ABI WITH/WO TBI

## 2023-11-22 LAB — PROTIME-INR
INR: 2.8 — ABNORMAL HIGH (ref 0.8–1.2)
Prothrombin Time: 30.7 s — ABNORMAL HIGH (ref 11.4–15.2)

## 2023-11-22 NOTE — Plan of Care (Signed)

## 2023-11-22 NOTE — Plan of Care (Signed)

## 2023-11-22 NOTE — Progress Notes (Signed)
 ANTICOAGULATION CONSULT NOTE  Pharmacy Consult for Warfarin Indication: atrial fibrillation  Allergies  Allergen Reactions   Amoxicillin Swelling    penile swelling   Lisinopril Cough   Kenalog  [Triamcinolone ] Rash    Patient Measurements: Height: 5' 4 (162.6 cm) Weight: 66.7 kg (147 lb) IBW/kg (Calculated) : 59.2  Vital Signs: Temp: 98.6 F (37 C) (09/21 0746) Temp Source: Oral (09/21 0746) BP: 117/55 (09/21 0746) Pulse Rate: 72 (09/21 0746)  Labs: Recent Labs    11/20/23 1356 11/20/23 1404 11/20/23 1826 11/21/23 0749 11/22/23 0314  HGB 8.6* 8.2*  --  8.7* 8.5*  HCT 26.8* 24.0*  --  26.7* 25.9*  PLT 304  --   --  335 290  LABPROT  --   --  23.4* 25.4* 30.7*  INR  --   --  2.0* 2.2* 2.8*  CREATININE 1.13 1.10  --  1.01 0.91    Estimated Creatinine Clearance: 41.6 mL/min (by C-G formula based on SCr of 0.91 mg/dL).   Medical History: Past Medical History:  Diagnosis Date   Acute CHF (HCC) 05/06/2013   Acute respiratory failure with hypoxia (HCC) 04/24/2023   Aortic stenosis    a. s/p tissue AVR 05/2013;  b. Echo (06/2013):  Mod LVH, EF 60-65%, no RWMA, Gr 2 DD, AVR ok (mean 13 mmHg), MAC, mild MR, mod LAE, mild RAE, PASP 46 mmHg (mild pulmo HTN)   Atrial fibrillation with RVR (HCC) 05/06/2013   Blood loss anemia    a. 05/2009 a/w GIB.   CAD (coronary artery disease)    a. Cath 04/2009: BMS to LCx 04/2009; CTO of RCA with L-R collaterals, 40% ostial diag.   Carpal tunnel syndrome    bilateral, carpal tunnel release x 2   Esophageal ulcer    a. 05/2009 with hemorrhage - injected with epinephrine -Dr. Lamar Bunk.   Esophagitis    a. 05/2009 a/w GIB.   GERD (gastroesophageal reflux disease)    EGD, Dr. Quay 2008   GI bleed    a. 05/2009 as above: with associated anemia. Lower esophageal ulcer with extensive erosive esophagitis and large clot by EGD 05/2009.    Hx of colonoscopy 2008   Dr. Cindy, polyps- 5 yr f/u recommended    Hyperlipidemia    Hypertension    Influenza A 04/24/2023   PAF (paroxysmal atrial fibrillation) (HCC)    a. Post-op AVR;  b. 04/2014 recurrent.   Perforated ear drum    right ear drum, tube in left ear drum, Dr. Jesus   Polymyalgia rheumatica [facility])    Prostate cancer [facility])    Vitamin D  deficiency     Medications:  Medications Prior to Admission  Medication Sig Dispense Refill Last Dose/Taking   acetaminophen  (TYLENOL ) 325 MG tablet Take 650 mg by mouth every 6 (six) hours as needed for moderate pain.   11/20/2023 Morning   Ascorbic Acid (VITAMIN C PO) Take 1 tablet by mouth daily.   11/20/2023 Morning   Cholecalciferol  (DIALYVITE VITAMIN D  5000) 125 MCG (5000 UT) capsule Take 5,000 Units by mouth 2 (two) times a week. Mondays and Fridays   11/20/2023 Morning   digoxin  (LANOXIN ) 0.125 MG tablet Take 1 tablet (0.125 mg total) by mouth every evening.   11/19/2023 Evening   furosemide  (LASIX ) 80 MG tablet Take 1 tablet (80 mg total) by mouth 2 (two) times daily. (Patient taking differently: Take 80 mg by mouth 2 (two) times daily as needed for fluid.)   11/20/2023 Morning   Lactobacillus (PROBIOTIC  ACIDOPHILUS) CAPS Take 1 capsule by mouth in the morning and at bedtime for 10 days. 20 capsule 0 11/20/2023 Morning   metolazone  (ZAROXOLYN ) 2.5 MG tablet Take 1 tablet (2.5 mg total) by mouth once a week. (Patient taking differently: Take 2.5 mg by mouth once a week. Mondays) 12 tablet 2 11/16/2023   metoprolol  succinate (TOPROL -XL) 50 MG 24 hr tablet Take 1 tablet (50 mg total) by mouth daily. Take with or immediately following a meal. 180 tablet 1 11/20/2023 Morning   mupirocin  ointment (BACTROBAN ) 2 % Apply 1 Application topically 2 (two) times daily.   11/20/2023 Morning   pantoprazole  (PROTONIX ) 40 MG tablet Take 40 mg by mouth daily.   11/20/2023 Morning   warfarin (COUMADIN ) 5 MG tablet TAKE 1 TABLET DAILY AS DIRECTED BY THE COUMADIN  CLINIC. (Patient taking differently: Take 5 mg by mouth daily.) 100  tablet 3 11/19/2023 at  4:00 PM   leptospermum manuka honey (MEDIHONEY) PSTE paste Apply 1 Application topically daily for 7 days. Interchangeable with TheraHoney  Cleanse trauma wound to right posterior leg with NS and pat dry Apply medihoney to wound bed to facilitate debridement. Cover with dry dressing. Change daily. Apply thin layer (3 mm) to wound. 88 mL 0    Scheduled:   [START ON 11/23/2023] cholecalciferol   5,000 Units Oral Once per day on Monday Thursday   digoxin   0.125 mg Oral QPM   furosemide   80 mg Oral BID   Influenza vac split trivalent PF  0.5 mL Intramuscular Tomorrow-1000   [START ON 11/23/2023] metolazone   2.5 mg Oral Q Mon   metoprolol  succinate  50 mg Oral Daily   pantoprazole   40 mg Oral Daily   sodium chloride  flush  3 mL Intravenous Q12H   warfarin  5 mg Oral q1600   Warfarin - Pharmacist Dosing Inpatient   Does not apply q1600   Infusions:   vancomycin  750 mg (11/21/23 2055)   PRN: acetaminophen  **OR** acetaminophen , albuterol , hydrALAZINE , ondansetron  **OR** ondansetron  (ZOFRAN ) IV, polyethylene glycol, sodium chloride  flush  Assessment: 89 yom with a history of HTN, HLD, AF on coumadin , CAD s/p CABG, aortic stenosis s/p AVR, PPM, HFpEF. Patient presenting with wound infection. . Warfarin per pharmacy consult placed for atrial fibrillation.  Patient taking warfarin prior to arrival. Home dose is 5 mg daily. Last taken 9/18 4pm per med history.  INR today is 2.8, which is therapeutic Hgb 8.5; plt 290  Goal of Therapy:  INR Goal 2-3 Monitor platelets by anticoagulation protocol: Yes   Plan:  Continue warfarin 5mg  daily Monitor for s/s of hemorrhage, daily INR, CBC Watch for new DDIs  Tempest Frankland A. Lyle, PharmD, BCPS, FNKF Clinical Pharmacist [place] Please utilize Amion for appropriate phone number to reach the unit pharmacist [facility])

## 2023-11-22 NOTE — Progress Notes (Signed)
 PROGRESS NOTE  Robert [name]  FMW:[id number] DOB: 08-30-1927 DOA: 11/20/2023 PCP: [name], [name]  E, PA  Consultants  Brief Narrative: 89 y.o. male with medical history significant Patient is a 89 y.o.  male with history of HTN, HLD, A-fib on Coumadin , CAD s/p CABG, aortic stenosis-s/p aortic valve replacement, PPM implantation, chronic HFpEF who was recently hospitalized and then transition to hospital at home program-discharged on 9/6 for RLE ulcer with cellulitis-presented to the hospital with worsening right leg swelling/erythema and development of 2 additional ulcers.   Per history obtained-since this past Monday (9/15)-he developed 2 additional ulcers in his right leg-since then he has had some greenish appearing discharge from these ulcers.  His right leg has become more erythematous and swollen compared to the past few days.  He was seen by his primary care practitioner-superficial cultures were obtained-a referral to [name] was placed-he was subsequently restarted on Linezolid , but didn't start this prior to coming to ED.  Due to continued worsening of his right leg wound/erythema/swelling-he presented to the ED for evaluation-subsequently the hospitalist service was asked to admit this patient for further evaluation and treatment   Assessment & Plan: RLE ulceration with cellulitis 2 additional new ulcers in the past 1 week - [name] consulted, appreciate input.  - ABI's noncompressible bilateral lower extremity arteries. - Question of traumatic venous insufficiency with possible arterial as well. - Unna boot now in place.  Patient tolerating well. - Patient/family are very keen on going home with hospital at home program--they would prefer to continue IV antibiotics at home through this program when ready for discharge-->pt now ready for DC.  However hospital at home program not available on weekend, will DC and admit to hospital at home program tomorrow  9/22  Leukocytosis: - Noted, increased today. - Continue IV antibiotics. -  as above patient prefers to discharge home with IV antibiotics through hospital at home program.   PAF Sinus rhythm Continue digoxin /metoprolol  INR therapeutic-warfarin per pharmacy   History of tachybradycardia syndrome Pacemaker in place   CAD s/p CABG No anginal symptoms Continue statin/beta-blocker Suspect not on ASA as patient on Coumadin    History of aortic stenosis-s/p aortic valve replacement Supportive care   Chronic HFpEF Euvolemic-RLE swelling is not from CHF or from cellulitis/soft tissue infection Continue furosemide /beta-blocker.   HTN BP stable Continue metoprolol /Lasix     Normocytic anemia Secondary to chronic disease No evidence of blood loss Trend CBC   History of prostate cancer Lupron  every 6 months.    DVT prophylaxis:   warfarin (COUMADIN ) tablet 5 mg  Code Status:   Code Status: Limited: Do not attempt resuscitation (DNR) -DNR-LIMITED -Do Not Intubate/DNI  Family Communication: Wife and daughter in room and all questions answered Level of care: Med-Surg Status is: Inpatient  Consults called: Orthopedics  Subjective: Patient lying in bed, no complaints today other than asking to go home.  Denies any leg pain.  Eating and drinking well  Objective: Vitals:   11/21/23 1708 11/21/23 2000 11/22/23 0746 11/22/23 1022  BP:  (!) 115/52 (!) 117/55 (!) 117/55  Pulse: 67 64 72 72  Resp:   16   Temp:  98.2 F (36.8 C) 98.6 F (37 C)   TempSrc:  Oral Oral   SpO2:  96% 96%   Weight:      Height:        Intake/Output Summary (Last 24 hours) at 11/22/2023 1426 Last data filed at 11/22/2023 1023 Gross per 24 hour  Intake 253 ml  Output 1200 ml  Net -947 ml   Filed Weights   11/20/23 1352  Weight: 66.7 kg   Body mass index is 25.23 kg/m.  Gen: 89 y.o. male in no apparent distress.  Nontoxic Pulm: Non-labored breathing.  Clear to auscultation bilaterally.   CV: Regular rate and rhythm.  GI: Abdomen soft, non-tender, non-distended Ext: Warm, no deformities, no pedal edema Skin: With old abrasions, bandages in place across forehead, bilateral upper extremities.  Right Unna boot placed right lower extremity Neuro: Alert and oriented. No focal neurological deficits. Psych: Calm  Judgement and insight appear normal. Mood & affect appropriate.    I have personally reviewed the following labs and images: CBC: Recent Labs  Lab 11/20/23 1356 11/20/23 1404 11/21/23 0749 11/22/23 0314  WBC 15.7*  --  17.0* 13.3*  NEUTROABS 7.2  --   --   --   HGB 8.6* 8.2* 8.7* 8.5*  HCT 26.8* 24.0* 26.7* 25.9*  MCV 97.8  --  97.4 97.0  PLT 304  --  335 290   BMP &GFR Recent Labs  Lab 11/20/23 1356 11/20/23 1404 11/21/23 0749 11/22/23 0314  NA 138 136 138 139  K 3.6 3.6 3.7 3.5  CL 95* 95* 96* 98  CO2 29  --  29 29  GLUCOSE 108* 107* 110* 125*  BUN 41* 38* 35* 29*  CREATININE 1.13 1.10 1.01 0.91  CALCIUM  9.0  --  8.9 8.6*   Estimated Creatinine Clearance: 41.6 mL/min (by C-G formula based on SCr of 0.91 mg/dL). Liver & Pancreas: Recent Labs  Lab 11/20/23 1356  AST 21  ALT 18  ALKPHOS 33*  BILITOT 1.0  PROT 6.3*  ALBUMIN  3.3*   No results for input(s): LIPASE, AMYLASE in the last 168 hours. No results for input(s): AMMONIA in the last 168 hours. Diabetic: No results for input(s): HGBA1C in the last 72 hours. No results for input(s): GLUCAP in the last 168 hours. Cardiac Enzymes: No results for input(s): CKTOTAL, CKMB, CKMBINDEX, TROPONINI in the last 168 hours. No results for input(s): PROBNP in the last 8760 hours. Coagulation Profile: Recent Labs  Lab 11/20/23 1826 11/21/23 0749 11/22/23 0314  INR 2.0* 2.2* 2.8*   Thyroid  Function Tests: No results for input(s): TSH, T4TOTAL, FREET4, T3FREE, THYROIDAB in the last 72 hours. Lipid Profile: No results for input(s): CHOL, HDL, LDLCALC, TRIG,  CHOLHDL, LDLDIRECT in the last 72 hours. Anemia Panel: No results for input(s): VITAMINB12, FOLATE, FERRITIN, TIBC, IRON, RETICCTPCT in the last 72 hours. Urine analysis:    Component Value Date/Time   COLORURINE YELLOW 04/24/2023 1419   APPEARANCEUR CLEAR 04/24/2023 1419   LABSPEC 1.012 04/24/2023 1419   PHURINE 6.0 04/24/2023 1419   GLUCOSEU NEGATIVE 04/24/2023 1419   HGBUR SMALL (A) 04/24/2023 1419   BILIRUBINUR NEGATIVE 04/24/2023 1419   KETONESUR NEGATIVE 04/24/2023 1419   PROTEINUR NEGATIVE 04/24/2023 1419   UROBILINOGEN 1.0 05/11/2013 2340   NITRITE NEGATIVE 04/24/2023 1419   LEUKOCYTESUR NEGATIVE 04/24/2023 1419   Sepsis Labs: Invalid input(s): PROCALCITONIN, LACTICIDVEN  Microbiology: No results found for this or any previous visit (from the past 240 hours).  Radiology Studies: No results found.   Scheduled Meds:  [START ON 11/23/2023] cholecalciferol   5,000 Units Oral Once per day on Monday Thursday   digoxin   0.125 mg Oral QPM   furosemide   80 mg Oral BID   Influenza vac split trivalent PF  0.5 mL Intramuscular Tomorrow-1000   [START ON 11/23/2023] metolazone   2.5 mg Oral Q Mon  metoprolol  succinate  50 mg Oral Daily   pantoprazole   40 mg Oral Daily   sodium chloride  flush  3 mL Intravenous Q12H   warfarin  5 mg Oral q1600   Warfarin - Pharmacist Dosing Inpatient   Does not apply q1600   Continuous Infusions:  vancomycin  750 mg (11/21/23 2055)     LOS: 2 days   35 minutes with more than 50% spent in reviewing records, counseling patient/family and coordinating care.  Reyes VEAR Gaw, MD [facility] [email or web address] 11/22/2023, 2:26 PM

## 2023-11-22 NOTE — Plan of Care (Signed)
 Wife at bedside. No pain this evening except for occasional shooting pains down right leg.  Tylenol  provided.   Problem: Education: Goal: Knowledge of General Education information will improve Description: Including pain rating scale, medication(s)/side effects and non-pharmacologic comfort measures Outcome: Progressing   Problem: Health Behavior/Discharge Planning: Goal: Ability to manage health-related needs will improve Outcome: Progressing   Problem: Clinical Measurements: Goal: Ability to maintain clinical measurements within normal limits will improve Outcome: Progressing Goal: Will remain free from infection Outcome: Progressing Goal: Diagnostic test results will improve Outcome: Progressing Goal: Respiratory complications will improve Outcome: Progressing Goal: Cardiovascular complication will be avoided Outcome: Progressing   Problem: Activity: Goal: Risk for activity intolerance will decrease Outcome: Progressing   Problem: Elimination: Goal: Will not experience complications related to bowel motility Outcome: Progressing Goal: Will not experience complications related to urinary retention Outcome: Progressing   Problem: Safety: Goal: Ability to remain free from injury will improve Outcome: Progressing   Problem: Skin Integrity: Goal: Risk for impaired skin integrity will decrease Outcome: Progressing

## 2023-11-23 DIAGNOSIS — L03115 Cellulitis of right lower limb: Secondary | ICD-10-CM | POA: Diagnosis not present

## 2023-11-23 DIAGNOSIS — I872 Venous insufficiency (chronic) (peripheral): Secondary | ICD-10-CM

## 2023-11-23 DIAGNOSIS — L97819 Non-pressure chronic ulcer of other part of right lower leg with unspecified severity: Secondary | ICD-10-CM

## 2023-11-23 LAB — PROTIME-INR
INR: 2.7 — ABNORMAL HIGH (ref 0.8–1.2)
Prothrombin Time: 29.7 s — ABNORMAL HIGH (ref 11.4–15.2)

## 2023-11-23 MED ORDER — VANCOMYCIN HCL 750 MG/150ML IV SOLN
750.0000 mg | INTRAVENOUS | Status: DC
  Administered 2023-11-23 – 2023-11-25 (×3): 750 mg via INTRAVENOUS
  Filled 2023-11-23 (×6): qty 150

## 2023-11-23 NOTE — Progress Notes (Signed)
 Remote PPM Transmission

## 2023-11-23 NOTE — Progress Notes (Signed)
 PROGRESS NOTE  Robert [name]  FMW:[id number] DOB: [birth year] DOA: 11/20/2023 PCP: [name], [name]  E, PA  Consultants  Brief Narrative: 89 y.o. male with medical history significant Patient is a 89 y.o.  male with history of HTN, HLD, A-fib on Coumadin , CAD s/p CABG, aortic stenosis-s/p aortic valve replacement, PPM implantation, chronic HFpEF who was recently hospitalized and then transition to hospital at home program-discharged on 9/6 for RLE ulcer with cellulitis-presented to the hospital with worsening right leg swelling/erythema and development of 2 additional ulcers.   Per history obtained-since this past Monday (9/15)-he developed 2 additional ulcers in his right leg-since then he has had some greenish appearing discharge from these ulcers.  His right leg has become more erythematous and swollen compared to the past few days.  He was seen by his primary care practitioner-superficial cultures were obtained-a referral to [name] was placed-he was subsequently restarted on Linezolid , but didn't start this prior to coming to ED.  Due to continued worsening of his right leg wound/erythema/swelling-he presented to the ED for evaluation-subsequently the hospitalist service was asked to admit this patient for further evaluation and treatment   Assessment & Plan: RLE ulceration with cellulitis 2 additional new ulcers in the past 1 week - [name] consulted, appreciate input. ABI's noncompressible bilateral lower extremity arteries. -Ortho consulted vascular surgery today.  Reportedly long pulses right foot. - Unna boot now in place.  Patient tolerating well. -  awaiting vascular surgery input but looks like plan will be conservative treatment with Unna boot plus antibiotics. -Plan will be to transfer patient to hospital at home program and continue IV antibiotics vancomycin  for full 7 days course.  Stop date 9/26. - Spoke with Dr. Eldonna about patient, he will admit to [facility] program.    Leukocytosis: - Improving, continue IV antibiotics. -  as above plan will be continued IV antibiotics through hospital at home program.   PAF Sinus rhythm Continue digoxin /metoprolol  INR therapeutic-warfarin per pharmacy   History of tachybradycardia syndrome Pacemaker in place, no issues in house   CAD s/p CABG No anginal symptoms Continue statin/beta-blocker Suspect not on ASA as patient on Coumadin    History of aortic stenosis-s/p aortic valve replacement Supportive care   Chronic HFpEF Euvolemic-RLE swelling is not from CHF or from cellulitis/soft tissue infection Continue furosemide /beta-blocker.   HTN BP stable Continue metoprolol /Lasix     Normocytic anemia Secondary to chronic disease No evidence of blood loss Trend CBC   History of prostate cancer Lupron  every 6 months.    DVT prophylaxis:   warfarin (COUMADIN ) tablet 5 mg  Code Status:   Code Status: Limited: Do not attempt resuscitation (DNR) -DNR-LIMITED -Do Not Intubate/DNI  Family Communication: Wife and daughter in room and all questions answered Level of care: Med-Surg Status is: Inpatient  Consults called: Orthopedics, vascular surgery  Subjective: Patient lying in bed, no complaints today other than asking to go home.  Tolerating Unna boot well..  Eating and drinking well  Objective: Vitals:   11/22/23 1022 11/22/23 1500 11/22/23 1900 11/23/23 0722  BP: (!) 117/55 118/64 (!) 121/57 (!) 117/56  Pulse: 72 64 70 70  Resp:  18  16  Temp:  98.2 F (36.8 C) 97.6 F (36.4 C) 99.3 F (37.4 C)  TempSrc:  Oral Oral Oral  SpO2:  98% 97% 97%  Weight:      Height:        Intake/Output Summary (Last 24 hours) at 11/23/2023 1214 Last data filed at 11/23/2023 0730 Gross  per 24 hour  Intake --  Output 400 ml  Net -400 ml   Filed Weights   11/20/23 1352  Weight: 66.7 kg   Body mass index is 25.23 kg/m.  Gen: 89 y.o. male in no apparent distress.  Nontoxic Pulm: Non-labored breathing.   Clear to auscultation bilaterally.  CV: Regular rate and rhythm.  GI: Abdomen soft, non-tender, non-distended Ext: Warm, no deformities, no pedal edema Skin: With old abrasions, bandages in place bilateral upper extremities, with healing abrasion noted across forehead.  Right Unna boot placed right lower extremity Neuro: Alert and oriented. No focal neurological deficits. Psych: Calm  Judgement and insight appear normal. Mood & affect appropriate.    I have personally reviewed the following labs and images: CBC: Recent Labs  Lab 11/20/23 1356 11/20/23 1404 11/21/23 0749 11/22/23 0314  WBC 15.7*  --  17.0* 13.3*  NEUTROABS 7.2  --   --   --   HGB 8.6* 8.2* 8.7* 8.5*  HCT 26.8* 24.0* 26.7* 25.9*  MCV 97.8  --  97.4 97.0  PLT 304  --  335 290   BMP &GFR Recent Labs  Lab 11/20/23 1356 11/20/23 1404 11/21/23 0749 11/22/23 0314  NA 138 136 138 139  K 3.6 3.6 3.7 3.5  CL 95* 95* 96* 98  CO2 29  --  29 29  GLUCOSE 108* 107* 110* 125*  BUN 41* 38* 35* 29*  CREATININE 1.13 1.10 1.01 0.91  CALCIUM  9.0  --  8.9 8.6*   Estimated Creatinine Clearance: 41.6 mL/min (by C-G formula based on SCr of 0.91 mg/dL). Liver & Pancreas: Recent Labs  Lab 11/20/23 1356  AST 21  ALT 18  ALKPHOS 33*  BILITOT 1.0  PROT 6.3*  ALBUMIN  3.3*   No results for input(s): LIPASE, AMYLASE in the last 168 hours. No results for input(s): AMMONIA in the last 168 hours. Diabetic: No results for input(s): HGBA1C in the last 72 hours. No results for input(s): GLUCAP in the last 168 hours. Cardiac Enzymes: No results for input(s): CKTOTAL, CKMB, CKMBINDEX, TROPONINI in the last 168 hours. No results for input(s): PROBNP in the last 8760 hours. Coagulation Profile: Recent Labs  Lab 11/20/23 1826 11/21/23 0749 11/22/23 0314 11/23/23 0459  INR 2.0* 2.2* 2.8* 2.7*   Thyroid  Function Tests: No results for input(s): TSH, T4TOTAL, FREET4, T3FREE, THYROIDAB in the last 72  hours. Lipid Profile: No results for input(s): CHOL, HDL, LDLCALC, TRIG, CHOLHDL, LDLDIRECT in the last 72 hours. Anemia Panel: No results for input(s): VITAMINB12, FOLATE, FERRITIN, TIBC, IRON, RETICCTPCT in the last 72 hours. Urine analysis:    Component Value Date/Time   COLORURINE YELLOW 04/24/2023 1419   APPEARANCEUR CLEAR 04/24/2023 1419   LABSPEC 1.012 04/24/2023 1419   PHURINE 6.0 04/24/2023 1419   GLUCOSEU NEGATIVE 04/24/2023 1419   HGBUR SMALL (A) 04/24/2023 1419   BILIRUBINUR NEGATIVE 04/24/2023 1419   KETONESUR NEGATIVE 04/24/2023 1419   PROTEINUR NEGATIVE 04/24/2023 1419   UROBILINOGEN 1.0 05/11/2013 2340   NITRITE NEGATIVE 04/24/2023 1419   LEUKOCYTESUR NEGATIVE 04/24/2023 1419   Sepsis Labs: Invalid input(s): PROCALCITONIN, LACTICIDVEN  Microbiology: No results found for this or any previous visit (from the past 240 hours).  Radiology Studies: No results found.   Scheduled Meds:  cholecalciferol   5,000 Units Oral Once per day on Monday Thursday   digoxin   0.125 mg Oral QPM   furosemide   80 mg Oral BID   Influenza vac split trivalent PF  0.5 mL Intramuscular Tomorrow-1000  metolazone   2.5 mg Oral Q Mon   metoprolol  succinate  50 mg Oral Daily   pantoprazole   40 mg Oral Daily   sodium chloride  flush  3 mL Intravenous Q12H   warfarin  5 mg Oral q1600   Warfarin - Pharmacist Dosing Inpatient   Does not apply q1600   Continuous Infusions:  vancomycin  750 mg (11/22/23 2010)     LOS: 3 days   35 minutes with more than 50% spent in reviewing records, counseling patient/family and coordinating care.  Reyes VEAR Gaw, MD [facility] [email or web address] 11/23/2023, 12:14 PM

## 2023-11-23 NOTE — Care Management Important Message (Signed)
 Important Message  Patient Details  Name: Robert [name] MRN: [id number] Date of Birth: 11-Aug-1927   Important Message Given:  Yes - Medicare IM     Claretta Deed 11/23/2023, 1:44 PM

## 2023-11-23 NOTE — Progress Notes (Signed)
 PT Cancellation Note/Sign-off  Patient Details Name: Robert [name] MRN: [id number] DOB: June 10, 1927   Cancelled Treatment:    Reason Eval/Treat Not Completed: Other (comment) Order was for H@H  evaluation.  I evaluated the patient on 9/20 and he was at his baseline level of function. Spoke to Dr. Eldonna regarding pts mobility via Secure Chat. PT will sign-off and continue to recommend pt ambulate with staff while in the hospital or in H@H  program to prevent hospital acquired debility.       Delos Oneil PARAS 11/23/2023, 11:57 AM Oneil Delos, PT   Acute Rehabilitation Services  Office [phone] 11/23/2023

## 2023-11-23 NOTE — Progress Notes (Signed)
 Patient ID: Robert [name], male   DOB: [birth year], 89 y.o.   MRN: [id number] Ankle-brachial indices showed diminished flow to the right lower extremity with noncompressible flow at the ankle.  I have consulted vascular surgery to see if endovascular intervention would help with wound healing.

## 2023-11-23 NOTE — Plan of Care (Signed)

## 2023-11-23 NOTE — Consult Note (Addendum)
 Hospital Consult    Reason for Consult:  right leg ulcers Requesting Physician:  Harden MRN #:  [id number]  History of Present Illness: This is a 89 y.o. Robert [name] who presented to the hospital with right leg ulcerations and swelling and erythema.    He was seen by Dr. Oris in 2017 for swelling of left leg.  He did not have any DVT but did have some potential proximal occlusion of the vein.  He had CT venogram at the same time that revealed a left pelvic sidewall area of prior surgical clips with a probable recurrence of prostate cancer and appeared to be causing compression of the vein.  Dr. Oris felt that it was lymphedema from lymphatic recurrence of his cancer.  He was going to see him back in a couple of months after urologic evaluation.  Pt was wearing thigh high compression and was compliant with this and elevating his legs when he could.    He comes into the hospital this past weekend with worsening right leg swelling and erythema and development of new ulcerations that was reported to have some greenish drainage.  Cultures were obtained by his PCP and he was referred to [name].  He was placed on Linezolid  but it was not started prior to coming to ED.  There was some question of venous insufficiency as well as arterial disease.  Pt placed in unna boot and was tolerating.  He was placed on IV abx.  Plan is to discharge home with hospital at home program.  ABI was obtained and he was noted to have diminished flow right leg with [place] flow at ankles and [name] consulted Vascular Surgery for further evaluation.   He states he scratched his leg with his toe nail about a month ago and put a bandaid on it.  It has continued to worsen.  He states he has leg swelling in both legs.  He denies any pain in his feet at night.  He does get cramps in his legs but not really while walking but at night.  His wife of 72 years is at bedside and she states that his leg swelling is much improved with the wrap.    Pt  has normal renal function, hgb 8.5, WBC 13.3k, INR 2.7 today.  Pt has hx of AVR with pericardial valve, left atrial maze procedure with RF ablation, CABG, and closure of left atrial appendage with vein harvest from left leg 05/13/2013.  He has hx of afib on coumadin , PPM, chronic HFpEF.    The pt is not on a statin for cholesterol management.  The pt is not on a daily aspirin .   Other AC:  coumadin  The pt is on BB, diuretic for hypertension.   The pt is not on medication for diabetes PTA. Tobacco hx:  never  Past Medical History:  Diagnosis Date   Acute CHF (HCC) 05/06/2013   Acute respiratory failure with hypoxia (HCC) 04/24/2023   Aortic stenosis    a. s/p tissue AVR 05/2013;  b. Echo (06/2013):  Mod LVH, EF 60-65%, no RWMA, Gr 2 DD, AVR ok (mean 13 mmHg), MAC, mild MR, mod LAE, mild RAE, PASP 46 mmHg (mild pulmo HTN)   Atrial fibrillation with RVR (HCC) 05/06/2013   Blood loss anemia    a. 05/2009 a/w GIB.   CAD (coronary artery disease)    a. Cath 04/2009: BMS to LCx 04/2009; CTO of RCA with L-R collaterals, 40% ostial diag.   Carpal tunnel  syndrome    bilateral, carpal tunnel release x 2   Esophageal ulcer    a. 05/2009 with hemorrhage - injected with epinephrine -Dr. Lamar Bunk.   Esophagitis    a. 05/2009 a/w GIB.   GERD (gastroesophageal reflux disease)    EGD, Dr. Quay 2008   GI bleed    a. 05/2009 as above: with associated anemia. Lower esophageal ulcer with extensive erosive esophagitis and large clot by EGD 05/2009.    Hx of colonoscopy 2008   Dr. Cindy, polyps- 5 yr f/u recommended   Hyperlipidemia    Hypertension    Influenza A 04/24/2023   PAF (paroxysmal atrial fibrillation) (HCC)    a. Post-op AVR;  b. 04/2014 recurrent.   Perforated ear drum    right ear drum, tube in left ear drum, Dr. Jesus   Polymyalgia rheumatica [facility])    Prostate cancer [facility])    Vitamin D  deficiency     Past Surgical History:  Procedure Laterality Date   AORTIC VALVE  REPLACEMENT N/A 05/12/2013   Procedure: AORTIC VALVE REPLACEMENT (AVR);  Surgeon: Maude Fleeta Ochoa, MD;  Location[facility] OR;  Service: Open Heart Surgery;  Laterality: N/A;   CARDIAC CATHETERIZATION  2011   CARDIOVERSION N/A 04/12/2020   Procedure: CARDIOVERSION;  Surgeon: Maranda Leim DEL, MD;  Location: [facility] ENDOSCOPY;  Service: Cardiovascular;  Laterality: N/A;   CORONARY ARTERY BYPASS GRAFT N/A 05/12/2013   Procedure: CORONARY ARTERY BYPASS GRAFTING (CABG);  Surgeon: Maude Fleeta Ochoa, MD;  Location: [facility] OR;  Service: Open Heart Surgery;  Laterality: N/A;  CABG x 1 using left leg greater saphenous vein harvested endoscopically   INTRAOPERATIVE TRANSESOPHAGEAL ECHOCARDIOGRAM N/A 05/12/2013   Procedure: INTRAOPERATIVE TRANSESOPHAGEAL ECHOCARDIOGRAM;  Surgeon: Maude Fleeta Ochoa, MD;  Location: [facility] OR;  Service: Open Heart Surgery;  Laterality: N/A;   LEFT AND RIGHT HEART CATHETERIZATION WITH CORONARY ANGIOGRAM N/A 05/09/2013   Procedure: LEFT AND RIGHT HEART CATHETERIZATION WITH CORONARY ANGIOGRAM;  Surgeon: Dorn JINNY Lesches, MD;  Location: [facility] CATH LAB;  Service: Cardiovascular;  Laterality: N/A;   MAZE N/A 05/12/2013   Procedure: MAZE;  Surgeon: Maude Fleeta Ochoa, MD;  Location: [facility] OR;  Service: Open Heart Surgery;  Laterality: N/A;   PACEMAKER IMPLANT N/A 05/21/2020   Procedure: PACEMAKER IMPLANT;  Surgeon: Waddell Danelle ORN, MD;  Location: [facility] INVASIVE CV LAB;  Service: Cardiovascular;  Laterality: N/A;   PROSTATECTOMY     TEE WITHOUT CARDIOVERSION N/A 04/12/2020   Procedure: TRANSESOPHAGEAL ECHOCARDIOGRAM (TEE);  Surgeon: Maranda Leim DEL, MD;  Location: [facility] ENDOSCOPY;  Service: Cardiovascular;  Laterality: N/A;    Allergies  Allergen Reactions   Amoxicillin Swelling    penile swelling   Lisinopril Cough   Kenalog  [Triamcinolone ] Rash    Prior to Admission medications   Medication Sig Start Date End Date Taking? Authorizing Provider  acetaminophen  (TYLENOL ) 325 MG tablet Take 650 mg by mouth every 6  (six) hours as needed for moderate pain.   Yes [provider]  Ascorbic Acid (VITAMIN C PO) Take 1 tablet by mouth daily.   Yes [provider]  Cholecalciferol  (DIALYVITE VITAMIN D  5000) 125 MCG (5000 UT) capsule Take 5,000 Units by mouth 2 (two) times a week. Mondays and Fridays   Yes [provider]  digoxin  (LANOXIN ) 0.125 MG tablet Take 1 tablet (0.125 mg total) by mouth every evening. 04/25/23  Yes Arlon Carliss ORN, DO  furosemide  (LASIX ) 80 MG tablet Take 1 tablet (80 mg total) by mouth 2 (two) times daily. Patient  taking differently: Take 80 mg by mouth 2 (two) times daily as needed for fluid. 09/18/23  Yes Croitoru, Mihai, MD  Lactobacillus (PROBIOTIC ACIDOPHILUS) CAPS Take 1 capsule by mouth in the morning and at bedtime for 10 days. 11/07/23 11/20/23 Yes Laurence Locus, DO  metolazone  (ZAROXOLYN ) 2.5 MG tablet Take 1 tablet (2.5 mg total) by mouth once a week. Patient taking differently: Take 2.5 mg by mouth once a week. Mondays 11/11/23  Yes Chandrasekhar, Mahesh A, MD  metoprolol  succinate (TOPROL -XL) 50 MG 24 hr tablet Take 1 tablet (50 mg total) by mouth daily. Take with or immediately following a meal. 09/25/23  Yes Duke, Jon Garre, PA  mupirocin  ointment (BACTROBAN ) 2 % Apply 1 Application topically 2 (two) times daily. 10/29/23  Yes [provider]  pantoprazole  (PROTONIX ) 40 MG tablet Take 40 mg by mouth daily. 03/13/23  Yes [provider]  warfarin (COUMADIN ) 5 MG tablet TAKE 1 TABLET DAILY AS DIRECTED BY THE COUMADIN  CLINIC. Patient taking differently: Take 5 mg by mouth daily. 12/30/22  Yes Dunn, Dayna N, PA-C  leptospermum manuka honey (MEDIHONEY) PSTE paste Apply 1 Application topically daily for 7 days. Interchangeable with TheraHoney  Cleanse trauma wound to right posterior leg with NS and pat dry Apply medihoney to wound bed to facilitate debridement. Cover with dry dressing. Change daily. Apply thin layer (3 mm) to wound. 11/07/23 11/14/23   Laurence Locus, DO    Social History   Socioeconomic History   Marital status: Married    Spouse name: Not on file   Number of children: Not on file   Years of education: Not on file   Highest education level: Not on file  Occupational History   Not on file  Tobacco Use   Smoking status: Never   Smokeless tobacco: Never  Vaping Use   Vaping status: Never Used  Substance and Sexual Activity   Alcohol use: No   Drug use: No   Sexual activity: Not on file  Other Topics Concern   Not on file  Social History Narrative   Not on file   Social Drivers of Health   Financial Resource Strain: Not on file  Food Insecurity: No Food Insecurity (11/20/2023)   Hunger Vital Sign    Worried About Running Out of Food in the Last Year: Never true    Ran Out of Food in the Last Year: Never true  Transportation Needs: No Transportation Needs (11/20/2023)   PRAPARE - Administrator, Civil Service (Medical): No    Lack of Transportation (Non-Medical): No  Physical Activity: Not on file  Stress: Not on file  Social Connections: Moderately Isolated (11/20/2023)   Social Connection and Isolation Panel    Frequency of Communication with Friends and Family: More than three times a week    Frequency of Social Gatherings with Friends and Family: More than three times a week    Attends Religious Services: Never    Database administrator or Organizations: No    Attends Banker Meetings: Never    Marital Status: Married  Catering manager Violence: Not At Risk (11/20/2023)   Humiliation, Afraid, Rape, and Kick questionnaire    Fear of Current or Ex-Partner: No    Emotionally Abused: No    Physically Abused: No    Sexually Abused: No     Family History  Problem Relation Age of Onset   CVA Mother    Kidney disease Father    Breast cancer  Daughter     ROS: [x]  Positive   [ ]  Negative   [ ]  All sytems reviewed and are negative  Cardiac: [x]  afib-on coumadin  [x]  hx  CABG/AVR (pericardial)  Vascular: []  pain in legs while walking []  pain in legs at rest []  pain in legs at night [x]  non-healing ulcers [x]  swelling in legs  Pulmonary: []  asthma/wheezing []  home O2  Neurologic: []  hx of CVA []  mini stroke   Hematologic: [x]  hx of cancer-prostate  Endocrine:   []  diabetes []  thyroid  disease  GI []  GERD  GU: []  CKD/renal failure []  HD--[]  M/W/F or []  T/T/S  Psychiatric: []  anxiety []  depression  Musculoskeletal: []  arthritis []  joint pain  Integumentary: []  rashes [x]  ulcers  Constitutional: []  fever  []  chills  Physical Examination  Vitals:   11/22/23 1900 11/23/23 0722  BP: (!) 121/57 (!) 117/56  Pulse: 70 70  Resp:  16  Temp: 97.6 F (36.4 C) 99.3 F (37.4 C)  SpO2: 97% 97%   Body mass index is 25.23 kg/m.  General:  WDWN in NAD Gait: Not observed HENT: WNL, normocephalic Pulmonary: normal non-labored breathing Abdomen:  soft, NT Skin: without rashes Vascular Exam/Pulses: Bilateral femoral pulses are palpable; right DP is easily palpable; unable to palpate pedal pulses left foot.  Extremities: unna boot wrap RLE; pictures from 11/20/2023       Musculoskeletal: no muscle wasting or atrophy  Neurologic: A&O X 3 Psychiatric:  The pt has Normal affect.   CBC    Component Value Date/Time   WBC 13.3 (H) 11/22/2023 0314   RBC 2.67 (L) 11/22/2023 0314   HGB 8.5 (L) 11/22/2023 0314   HGB 10.6 (L) 05/15/2023 1213   HGB 10.7 (L) 04/21/2023 1033   HCT 25.9 (L) 11/22/2023 0314   HCT 32.8 (L) 04/21/2023 1033   PLT 290 11/22/2023 0314   PLT 146 (L) 05/15/2023 1213   PLT 141 (L) 04/21/2023 1033   MCV 97.0 11/22/2023 0314   MCV 97 04/21/2023 1033   MCH 31.8 11/22/2023 0314   MCHC 32.8 11/22/2023 0314   RDW 16.7 (H) 11/22/2023 0314   RDW 14.8 04/21/2023 1033   LYMPHSABS 7.9 (H) 11/20/2023 1356   LYMPHSABS 3.7 (H) 05/03/2020 0935   MONOABS 0.6 11/20/2023 1356   EOSABS 0.0 11/20/2023 1356   EOSABS 0.2  05/03/2020 0935   BASOSABS 0.0 11/20/2023 1356   BASOSABS 0.1 05/03/2020 0935    BMET    Component Value Date/Time   NA 139 11/22/2023 0314   NA 142 09/25/2023 1117   K 3.5 11/22/2023 0314   CL 98 11/22/2023 0314   CO2 29 11/22/2023 0314   GLUCOSE 125 (H) 11/22/2023 0314   BUN 29 (H) 11/22/2023 0314   BUN 36 09/25/2023 1117   CREATININE 0.91 11/22/2023 0314   CREATININE 1.07 05/15/2023 1213   CREATININE 0.81 06/03/2013 1321   CALCIUM  8.6 (L) 11/22/2023 0314   GFRNONAA >60 11/22/2023 0314   GFRNONAA >60 05/15/2023 1213   GFRAA 99 04/16/2017 0729    COAGS: Lab Results  Component Value Date   INR 2.7 (H) 11/23/2023   INR 2.8 (H) 11/22/2023   INR 2.2 (H) 11/21/2023     Non-Invasive Vascular Imaging:   ABI 11/21/2023: ABI Findings:  +---------+------------------+-----+-----------+--------+  Right   Rt Pressure (mmHg)IndexWaveform   Comment   +---------+------------------+-----+-----------+--------+  Brachial 139                    triphasic            +---------+------------------+-----+-----------+--------+  PTA     240               1.69 multiphasic          +---------+------------------+-----+-----------+--------+  DP      231               1.63 multiphasic          +---------+------------------+-----+-----------+--------+  Great Toe70                0.49 Abnormal             +---------+------------------+-----+-----------+--------+   +---------+------------------+-----+-----------+-------+  Left    Lt Pressure (mmHg)IndexWaveform   Comment  +---------+------------------+-----+-----------+-------+  Brachial 142                    triphasic           +---------+------------------+-----+-----------+-------+  PTA     254               1.79 multiphasic         +---------+------------------+-----+-----------+-------+  DP      251               1.77 multiphasic          +---------+------------------+-----+-----------+-------+  Great Toe67                0.47 Abnormal            +---------+------------------+-----+-----------+-------+   +-------+---------------------+-----------+------------+------------+  ABI/TBIToday's ABI          Today's TBIPrevious ABIPrevious TBI  +-------+---------------------+-----------+------------+------------+  Right 1.69/non compressible0.49                                 +-------+---------------------+-----------+------------+------------+  Left  1.79/non compressible0.47                                 +-------+---------------------+-----------+------------+------------+    ASSESSMENT/PLAN: This is a 89 y.o. Robert [name] with hx of leg swelling and new ulcerations RLE   -pt with easily palpable right DP pulse just proximal to toes.  He does have a toe pressure of 70 on the right.  Given the fact he is on coumadin  for afib and INR is 2.7, he is 89 y/o with hx of HF and PPM and has a palpable right DP pulse and toe pressure of 70, feel it is reasonable to see if the ulcers improve with compression wrap.  Discussed this with pt and wife and that if we proceed with angiogram, he would need to hold his coumadin  and it is not without risks.  If wounds do not improve with conservative measures, may benefit from angiogram.   -Dr. Lanis to evaluate pt and determine further plan   Lucie Apt, PA-C [facility] [phone]  VASCULAR STAFF ADDENDUM: I have independently interviewed and examined the patient. I agree with the above.  Patient is a 89 year old Robert [name] with venous wounds on the right lower extremity.  ABI was reviewed.  1 noncompressible, he has sufficient toe pressure for wound healing.  He has palpable pedal pulses bilaterally.  I had a long discussion with the patient and his wife, and also called [name].  Should his wounds worsen, or wound healing stagnate, I would be more than  happy to offer an angiogram.  At the age of 47, I think it is reasonable to attempt  wound care prior to moving forward with intervention.  Fonda FORBES Rim MD [facility] Phone Number: [phone] 11/23/2023 1:48 PM

## 2023-11-23 NOTE — Plan of Care (Signed)
 Hospital at [facility] Plan of Care Note    Robert [name]   FMW:[id number]  DOB: 1927/11/09  DOA: 11/20/2023     3 Date of Service: 11/23/2023    Brief Narrative:  89 y.o. male with medical history significant Patient is a 89 y.o.  male with history of HTN, HLD, A-fib on Coumadin , CAD s/p CABG, aortic stenosis-s/p aortic valve replacement, PPM implantation, chronic HFpEF who was recently hospitalized and then transition to hospital at home program-discharged on 9/6 for RLE ulcer with cellulitis-presented to the hospital with worsening right leg swelling/erythema and development of 2 additional ulcers.   Per history obtained-since this past Monday (9/15)-he developed 2 additional ulcers in his right leg-since then he has had some greenish appearing discharge from these ulcers.  His right leg has become more erythematous and swollen compared to the past few days.  He was seen by his primary care practitioner-superficial cultures were obtained-a referral to [name] was placed-he was subsequently restarted on Linezolid , but didn't start this prior to coming to ED.  Due to continued worsening of his right leg wound/erythema/swelling-he presented to the ED for evaluation-subsequently the hospitalist service was asked to admit this patient for further evaluation and treatment  Subjective:  Patient overall feels wounds are improving. Unna boots placed with improving swelling.   Hospital Problems Assessment and Plan: No notes have been filed under this hospital service. Service: Hospitalist       Objective Vital signs were reviewed and unremarkable.  Vitals:   11/23/23 0722 11/23/23 1620 11/23/23 1755 11/23/23 1804  BP: (!) 117/56 137/62 128/66   Pulse: 70 66 73 73  Temp: 99.3 F (37.4 C) 98.2 F (36.8 C) 98.7 F (37.1 C)   Resp: 16 16 15    Height:   5' 4 (1.626 m)   Weight:   66.8 kg   SpO2: 97% 100% 97%   TempSrc: Oral  Oral   BMI (Calculated):   25.25    Exam Physical  Exam Constitutional:      Appearance: He is normal weight.  HENT:     Head: Normocephalic and atraumatic.     Nose: Nose normal.     Mouth/Throat:     Mouth: Mucous membranes are moist.  Eyes:     Pupils: Pupils are equal, round, and reactive to light.  Cardiovascular:     Rate and Rhythm: Normal rate and regular rhythm.  Pulmonary:     Effort: Pulmonary effort is normal.  Abdominal:     General: Bowel sounds are normal.  Skin:    Comments: See pictures    Neurological:     General: No focal deficit present.  Psychiatric:        Mood and Affect: Mood normal.      Labs / Other Information There are no new results to review at this time.  VAS US  ABI WITH/WO TBI  LOWER EXTREMITY DOPPLER STUDY  Patient Name:  Robert [name]  Date of Exam:   11/21/2023 Medical Rec #: [id number]             Accession #:    [id number] Date of Birth: 06-12-1927             Patient Gender: M Patient Age:   50 years Exam Location:  [facility] Procedure:      VAS US  ABI WITH/WO TBI Referring Phys: DONALDA APPLEBAUM  --------------------------------------------------------------------------------   Indications: Traumatic venous ulceration status post fall.  High Risk Factors: Hypertension, hyperlipidemia,  no history of smoking, coronary                    artery disease.  Other Factors: Venous insufficiency, right > left. Pacemaker. Atrial                fibrillation/flutter. CHF. History of aortic valve replacement                2016  Limitations: Today's exam was limited due to involuntary patient movement and              Arrythmia.  Comparison Study: No prior study on file  Performing Technologist: Elder Levan Chief Tech    Examination Guidelines: A complete evaluation includes at minimum, Doppler waveform signals and systolic blood pressure reading at the level of bilateral brachial, anterior tibial, and posterior tibial arteries, when vessel segments are  accessible. Bilateral testing is considered an integral part of a complete examination. Photoelectric Plethysmograph (PPG) waveforms and toe systolic pressure readings are included as required and additional duplex testing as needed. Limited examinations for reoccurring indications may be performed as noted.    ABI Findings: +---------+------------------+-----+-----------+--------+ Right    Rt Pressure (mmHg)IndexWaveform   Comment  +---------+------------------+-----+-----------+--------+ Brachial 139                    triphasic           +---------+------------------+-----+-----------+--------+ PTA      240               1.69 multiphasic         +---------+------------------+-----+-----------+--------+ DP       231               1.63 multiphasic         +---------+------------------+-----+-----------+--------+ Great Toe70                0.49 Abnormal            +---------+------------------+-----+-----------+--------+  +---------+------------------+-----+-----------+-------+ Left     Lt Pressure (mmHg)IndexWaveform   Comment +---------+------------------+-----+-----------+-------+ Brachial 142                    triphasic          +---------+------------------+-----+-----------+-------+ PTA      254               1.79 multiphasic        +---------+------------------+-----+-----------+-------+ DP       251               1.77 multiphasic        +---------+------------------+-----+-----------+-------+ Great Toe67                0.47 Abnormal           +---------+------------------+-----+-----------+-------+  +-------+---------------------+-----------+------------+------------+ ABI/TBIToday's ABI          Today's TBIPrevious ABIPrevious TBI +-------+---------------------+-----------+------------+------------+ Right  1.69/non compressible0.49                                 +-------+---------------------+-----------+------------+------------+ Left   1.79/non compressible0.47                                +-------+---------------------+-----------+------------+------------+  Arterial wall calcification precludes accurate ankle pressures and ABIs.   Summary: Right: Resting right ankle-brachial index indicates noncompressible right lower extremity arteries. The right toe-brachial index is abnormal.  Left: Resting left ankle-brachial index indicates noncompressible left lower extremity arteries. The left toe-brachial index is abnormal.    *See table(s) above for measurements and observations.    Electronically signed by Penne Colorado MD on 11/22/2023 at 12:03:43 PM.      Final    Lab Results  Component Value Date   WBC 13.3 (H) 11/22/2023   HGB 8.5 (L) 11/22/2023   HCT 25.9 (L) 11/22/2023   MCV 97.0 11/22/2023   PLT 290 11/22/2023   Last metabolic panel Lab Results  Component Value Date   GLUCOSE 125 (H) 11/22/2023   NA 139 11/22/2023   K 3.5 11/22/2023   CL 98 11/22/2023   CO2 29 11/22/2023   BUN 29 (H) 11/22/2023   CREATININE 0.91 11/22/2023   GFRNONAA >60 11/22/2023   CALCIUM  8.6 (L) 11/22/2023   PHOS 3.8 04/25/2023   PROT 6.3 (L) 11/20/2023   ALBUMIN  3.3 (L) 11/20/2023   LABGLOB 1.5 04/21/2023   BILITOT 1.0 11/20/2023   ALKPHOS 33 (L) 11/20/2023   AST 21 11/20/2023   ALT 18 11/20/2023   ANIONGAP 12 11/22/2023     Hospital at Home Admission Criteria Checklist:  Formal consent explained in detail and signed at the bedside: {yes/no/n/a:21102::yes} Patient meets inpatient admission criteria (see below for further details) {yes/no/n/a:21102::yes} Is pt Medicare FFS/[employer] Medicare-Medicaid, [employer], [employer] ( required for initial launch with plan to expand)? {yes/no/n/a:21102::yes} Lives within 25 mil/ 30 min from [facility] within [place](pt may stay with family member during admission who lives within 25 miles  or 30 min from [facility] w[facility]in [facility])? {yes/no/n/a:21102::yes} Hemodynamically stable with relatively low risk of clinical deterioration-not requiring ICU? {yes/no/n/a:21102::yes} Age >55? {yes/no/n/a:21102::yes} Does not require frequent touch-points or complex interventions/medications (ie Titrated Infusions (IV insulin , heparin  drips, vasoactive drips, use of infused or injectable controlled substances, patients on insulin )? {yes/no/n/a:21102::yes} Any Behavioral Health comorbidities likely to increase risk for in-home care (ie Acute delirium or experiencing a marked altered mental status and cause is not a treatable condition in the home)? {yes/no/n/a:21102::no} Has the patient been on BIPAP during course of ED evaluation or hospitalization? {yes/no/n/a:21102::no} IF YES, Has the patient been off of BIPAP for >24 hours(If NO-THEN PATIENT DOES MEET INCLUSION CRITERIA)? {yes/no/default n/a:21102::not applicable} On Room Air or Needs oxygen at home (<6L)? {Therapy; copd oxygen:17808::is not on home oxygen therapy.} Active safety concerns (ie Unable to use bedside commode independently and lacks caregiver support for safety- needs SNF placement, unable to obtain IV access)? {yes/no/n/a:21102::no} Has skin check been performed? {yes/no/n/a:21102::yes}  Has Physical Therapy screened the patient? {yes/no/n/a:21102::yes}  Common admission diagnoses including: CAP, COPD Exacerbation, Acute on chronic heart failure, Cellulitis, UTI , dehydration, acute resp failure with hypoxia (requiring <5L)   Social Screening:  - Has the family been directly contacted about Hospital at Home program with consent obtained (if yes- please document who was spoken to with name and phone number)? {yes/no/n/a:21102::yes} Denies significant ETOH intake? {yes/no/n/a:21102::yes} Does not smoke and understands may not smoke in the presence of oxygen? {yes/no/n/a:21102::yes} Patient states able to  use iPad/phone for communication/has family who is able to use? {yes/no/n/a:21102::yes} Patient has agreed to be compliant with medication and treatment regimen of the program? {yes/no/n/a:21102::yes} Any active drug use in patient or primary caregiver including daily dosing of methadone? {yes/no/n/a:21102::no} Stable home environment ( access to appropriate heating in cold conditions and/or appropriate air conditioning in hot conditions and/or no running water/electricity)? {yes/no/n/a:21102::yes}  No aggressive pets at home? {yes/no/n/a:21102::no} Firearm present? {yes/no/n/a:21102::no}  With ability  or willingness to store them unloaded in a locked case for duration of hospitalization? {yes/no/n/a:21102::yes} Ambulatory? {yes/no/n/a:21102::yes}  {ambulation details:317880} Bed bugs present on home evaluation? {yes/no/n/a:21102::no} Family support system in place? {yes/no/n/a:21102::yes} Patient feels safe at home and does not endorse any violence? {yes/no/n/a:21102::yes} Any actively decompensated behavioral health issues including agitation/aggressive behavior? {yes/no/n/a:21102::no}  Patient requests food to be provided by hospital home program? {yes/no/n/a:21102::no} PT/OT eval completed and not requiring SNF, ALF, inpatient rehab? {yes/no/n/a:21102::yes}  To be admitted to the Hospital at [facility] program, a patient generally must meet the following: 1. Requirement for Inpatient Level of Care: The patient's condition must necessitate an inpatient level of care. This is typically indicated by one or more of the following, depending on their specific diagnosis:  Persistent tachycardia despite appropriate treatment (e.g., for Heart Failure, UTI). Persistent tachypnea (rapid breathing) or dyspnea (shortness of breath) that hasn't improved sufficiently with observation care (e.g., for Heart Failure, Pneumonia, Viral Illness, COVID). Hypoxemia (low oxygen levels), such as  a new need for oxygen, an increased need from baseline, or specific oxygen saturation levels (e.g., SpO2 <90-94% depending on the condition) that persist despite observation (e.g., for Heart Failure, COPD, Pneumonia, Viral Illness, COVID). Need for Intravenous (IV) hydration due to an inability to maintain oral hydration, which persists despite observation care (e.g., for Cellulitis, UTI, Viral Illness, COVID). Specific to Heart Failure: Persistent pulmonary edema, indicated by a new oxygen need, lack of improvement with IV diuretics, and ongoing tachypnea/dyspnea. Specific to COPD: A decrease in known baseline resting oxygen saturation (SpO2) by 4% or more, or an increase in pre-existing supplemental oxygen requirements, which persists despite observation and requires continued close monitoring. Specific to Pneumonia: A Pneumonia Severity Index (PSI) class IV (moderate risk). Specific to Cellulitis: Failure of outpatient antibiotic therapy (indicated by progression or no improvement after a minimum of 48 hours on an adequate regimen) or a clinical presentation (like acuity or rapidity of progression) that requires the intensity of monitoring found in an inpatient setting. Specific to UTI: Persistence or worsening of clinical findings like fever, pain, or dehydration despite observation care; presence of significant uropathy; suspected infection of an indwelling prosthetic device, stent, implant, or graft; or pregnancy with suspected pyelonephritis.  2. Appropriateness for Hospital at Home Setting: The patient's overall clinical picture, including the severity of their illness, their care needs, and their medical history and comorbidities, must be suitable for management in the Hospital at Home environment. This essentially means that none of the exclusion criteria (listed below) are met.  Unified Exclusion Criteria for Hospital at Home Admission: A patient would not be eligible for Hospital at Home if  any of the following are present: Hemodynamic Instability: Hypotension (low blood pressure) is present. Respiratory Instability or Needs Beyond Program Capability: There is a new need for invasive or noninvasive ventilatory assistance (like BiPAP or a ventilator). Oxygenation is not sufficient, generally indicated if an FiO2 (fraction of inspired oxygen) of 45% (which is about 6 Liters/minute via nasal cannula) or more is required to keep oxygen saturation (SpO2) at 90% or greater. Monitoring or Procedural Needs Beyond Program Capability: There is a need for invasive monitoring, such as a pulmonary artery catheter or an arterial line. There is a need for immediate-response telemetry monitoring (for dangerous arrhythmia detection and subsequent immediate intervention). The required medication regimen is beyond the capabilities of Hospital at Home (e.g., dosing intervals are too frequent for home administration). There is a need for a procedure that cannot be performed by the [facility]  at Home team (e.g., significant wound debridement or abscess drainage for cellulitis, or percutaneous nephrostomy for a complicated UTI). Significant Organ Dysfunction or Markers of Severe Illness: Mental status is not at baseline, or there is altered mental status suggestive of inadequate perfusion. Renal (kidney) function is unstable or showing an ongoing decline. There is evidence of inadequate perfusion, such as metabolic acidosis or myocardial ischemia. Uncompensated acidosis is present. Condition-Specific Severity or Complications Making Home Care Unsuitable: For Heart Failure: Known severe cardiac valvular disease (e.g., aortic stenosis, mitral regurgitation); or severe peripheral edema that impairs the ability to urinate or ambulate. For COPD: Known concurrent comorbidity or finding that indicates a higher-risk COPD exacerbation (e.g., pulmonary fibrosis, cavitation, pleural effusion, pneumothorax, rib  fracture). For Pneumonia: Pneumonia Severity Index (PSI) class V (indicating high risk for inpatient mortality); known concurrent comorbidity or finding that indicates higher-risk pneumonia (e.g., pulmonary fibrosis, cavitation, large or loculated pleural effusion); or a concomitant serious infectious process like endocarditis or empyema. For Cellulitis: Orbital, periorbital, or necrotizing infection is suspected; or a concomitant serious infectious process like endocarditis, septic emboli, or septic joint space infection. For UTI: Urinary tract obstruction (e.g., kidney stone, bladder outlet obstruction); or a concomitant serious infectious process like endocarditis or septic emboli. For Viral Illness & COVID-19: A concomitant serious infectious process like endocarditis or empyema.  General Comorbidities or Status:  The patient is significantly immunosuppressed (this applies to Pneumonia, Cellulitis, UTI, Viral Illness, and COVID-19). The patient meets inpatient admission criteria for a second diagnosis, or has care needs beyond the capabilities of Hospital at Home due to an active clinically significant comorbidity. (This is a general exclusion across all listed conditions)  Time spent: *** [facility] 11/23/2023, 2:26 PM

## 2023-11-23 NOTE — Progress Notes (Signed)
 Patient transferred to the Hospital at [facility] program. Communicated with patient via video tablet. AAOx4. Denies pain. Plan of care reviewed with patient. Patient was told that the virtual nurse is available 24/7. [facility] phone number given to patient. Tablet usage explained. Medication reconciliation completed with [place], paramedic. Patient and family agreeable with plan of care.

## 2023-11-23 NOTE — Progress Notes (Signed)
 Mobility Specialist Progress Note:   11/23/23 0937  Mobility  Activity  (Bed mobility)  Level of Assistance Standby assist, set-up cues, supervision of patient - no hands on  Range of Motion/Exercises Active;Right leg;Left leg  Activity Response Tolerated well  Mobility Referral Yes  Mobility visit 1 Mobility  Mobility Specialist Start Time (ACUTE ONLY) 0859  Mobility Specialist Stop Time (ACUTE ONLY) 0908  Mobility Specialist Time Calculation (min) (ACUTE ONLY) 9 min   Received pt in bed and refused OOB mobility despite encouragement; reason unspecified. Pt agreeable to bed mobility. Pt completed x10 BLE leg lifts and ankle pumps. Pt returned supine in bed. Personal belongings and call light within reach. All needs met.  Lavanda Pollack Mobility Specialist  Please contact via [employer] or  Rehab Office [phone]

## 2023-11-23 NOTE — Progress Notes (Signed)
 ANTICOAGULATION CONSULT NOTE  Pharmacy Consult for Warfarin Indication: atrial fibrillation  Allergies  Allergen Reactions   Amoxicillin Swelling    penile swelling   Lisinopril Cough   Kenalog  [Triamcinolone ] Rash    Patient Measurements: Height: 5' 4 (162.6 cm) Weight: 66.7 kg (147 lb) IBW/kg (Calculated) : 59.2  Vital Signs: Temp: 99.3 F (37.4 C) (09/22 0722) Temp Source: Oral (09/22 0722) BP: 117/56 (09/22 0722) Pulse Rate: 70 (09/22 0722)  Labs: Recent Labs    11/20/23 1356 11/20/23 1404 11/20/23 1826 11/21/23 0749 11/22/23 0314 11/23/23 0459  HGB 8.6* 8.2*  --  8.7* 8.5*  --   HCT 26.8* 24.0*  --  26.7* 25.9*  --   PLT 304  --   --  335 290  --   LABPROT  --   --    < > 25.4* 30.7* 29.7*  INR  --   --    < > 2.2* 2.8* 2.7*  CREATININE 1.13 1.10  --  1.01 0.91  --    < > = values in this interval not displayed.    Estimated Creatinine Clearance: 41.6 mL/min (by C-G formula based on SCr of 0.91 mg/dL).  Assessment: 58 yom with a history of HTN, HLD, AF on coumadin , CAD s/p CABG, aortic stenosis s/p AVR, PPM, HFpEF. Patient presenting with wound infection. . Warfarin per pharmacy consult placed for atrial fibrillation.  Patient taking warfarin prior to arrival. Home dose is 5 mg daily. Last taken PTA on 9/18 @4pm  per med history.  INR today is 2.7, which is therapeutic Most recent CBC: Hgb 8.5; plt 290  Goal of Therapy:  INR Goal 2-3 Monitor platelets by anticoagulation protocol: Yes   Plan:  Continue warfarin 5mg  daily Monitor for s/s of hemorrhage, daily INR, CBC Watch for new DDIs Noted vascular workup is ongoing but current plan to manage conservatively  Sharyne Glatter, PharmD, BCCCP Critical Care Clinical Pharmacist 11/23/2023 9:16 AM

## 2023-11-23 NOTE — Progress Notes (Signed)
 Arrived to the PT's house for [facility] admission. PT is alert and oriented to his baseline. PT was assisted into the house via his at home walker. He was able to walk without assistance and denied any issues with walking. PT sat down in home recliner without incident. PT's daughter was present in the house.   All equipment was set up and explained to the family. Medication reconciliation was performed with RN McWhite. Medications were administered as prescribed. PT refused the flu vaccine as he wanted it to be done by his PCP later in the year.   House Ax found no hazards. The house is clean and PT's daughter lives next door and checks in often on her parents.   PT was reminded that if they needed anything to call the virtual RN on the tablet.   PT requested a 1000 AM visit. We will be doing wound care and skin Ax at the morning visit.

## 2023-11-24 ENCOUNTER — Ambulatory Visit: Admitting: Pulmonary Disease

## 2023-11-24 DIAGNOSIS — L97918 Non-pressure chronic ulcer of unspecified part of right lower leg with other specified severity: Secondary | ICD-10-CM

## 2023-11-24 DIAGNOSIS — D631 Anemia in chronic kidney disease: Secondary | ICD-10-CM

## 2023-11-24 DIAGNOSIS — Z8546 Personal history of malignant neoplasm of prostate: Secondary | ICD-10-CM

## 2023-11-24 DIAGNOSIS — D72829 Elevated white blood cell count, unspecified: Secondary | ICD-10-CM

## 2023-11-24 DIAGNOSIS — I5032 Chronic diastolic (congestive) heart failure: Secondary | ICD-10-CM

## 2023-11-24 LAB — BASIC METABOLIC PANEL WITH GFR
Anion gap: 11 (ref 5–15)
BUN: 25 mg/dL — ABNORMAL HIGH (ref 8–23)
CO2: 30 mmol/L (ref 22–32)
Calcium: 9.1 mg/dL (ref 8.9–10.3)
Chloride: 97 mmol/L — ABNORMAL LOW (ref 98–111)
Creatinine, Ser: 1.04 mg/dL (ref 0.61–1.24)
GFR, Estimated: >60 mL/min (ref >60)
Glucose, Bld: 109 mg/dL — ABNORMAL HIGH (ref 70–99)
Potassium: 4 mmol/L (ref 3.5–5.1)
Sodium: 138 mmol/L (ref 135–145)

## 2023-11-24 LAB — PROTIME-INR
INR: 3.2 — ABNORMAL HIGH (ref 0.8–1.2)
Prothrombin Time: 34.5 s — ABNORMAL HIGH (ref 11.4–15.2)

## 2023-11-24 LAB — CBC
HCT: 29.2 % — ABNORMAL LOW (ref 39.0–52.0)
Hemoglobin: 9.3 g/dL — ABNORMAL LOW (ref 13.0–17.0)
MCH: 31.4 pg (ref 26.0–34.0)
MCHC: 31.8 g/dL (ref 30.0–36.0)
MCV: 98.6 fL (ref 80.0–100.0)
Platelets: 367 K/uL (ref 150–400)
RBC: 2.96 MIL/uL — ABNORMAL LOW (ref 4.22–5.81)
RDW: 16.8 % — ABNORMAL HIGH (ref 11.5–15.5)
WBC: 15 K/uL — ABNORMAL HIGH (ref 4.0–10.5)
nRBC: 0 % (ref 0.0–0.2)

## 2023-11-24 NOTE — Progress Notes (Signed)
 Arrived to find the PT sitting in his chair. No obvious distress or trauma noted. PT is alert and oriented to his baseline. PT stated that he was in pain rating it a 3/10 on the pain scale that was in his leg.   Una boot dressing was replaced by [employer]. Updated pictures were taken and placed in media folder of the chart. Dressing change on both arm wounds.   Tylenol  PO was administered for pain.   PT and wife denied having any other questions and were reminded if they need anything to call the virtual RN.

## 2023-11-24 NOTE — Progress Notes (Signed)
 Arrived to find the PT sitting upright in his recliner, no obvious distress or trauma noted. PT is alert and oriented to his baseline. PT stated that he was feeling fine and didn't have any complaints at the time.  Physical exam showed negative DCAPBTLS to the head, neck, or face. PERRLA. Negative JVD or tracheal deviation. Chest expansion is equal with non-labored breathing or SOB. ABD is soft, non-tender. Una boot placed on RLE earlier in the day.   All medications were administered as prescribed. IV dressing was changed due to extension set failure.   After the ABX were completed, PT was c/o pain in his leg. Tylenol  PO was administered.   PT and wife denied any other questions and were reminded that if they needed anything to call the virtual RN.

## 2023-11-24 NOTE — Plan of Care (Signed)

## 2023-11-24 NOTE — Progress Notes (Signed)
 2023--Patient identifiers review  patient A&O hard of hearing. Patient sitting in recliner with caregiver/spouse and Paramedic at side.  Patient/caregiver denies current health concerns. Patient pain reported at 3. Pain medication administration provided by Paramedic prior to video call. No other medications given at time video call with RN oversight. Overnight monitoring plan and request to contact daughter when possible reviewed. Per Patient /caregiver no other questions or concerns at the time of video call reported. Patient /caregiver encouraged to call as needed and will continue to be monitored via current health monitoring.

## 2023-11-24 NOTE — Progress Notes (Signed)
 Patient made aware that the Warfarin is being held today due to elevated INR level. No complaints from the patient at this time. Plan for evening paramedic visit between 1830-1930.

## 2023-11-24 NOTE — Progress Notes (Signed)
 PHARMACY - ANTICOAGULATION CONSULT NOTE  Pharmacy Consult for Warfarin Indication: atrial fibrillation  Allergies  Allergen Reactions   Amoxicillin Swelling    penile swelling   Lisinopril Cough   Kenalog  [Triamcinolone ] Rash    Patient Measurements: Height: 5' 4 (162.6 cm) Weight: 66.8 kg (147 lb 3.2 oz) IBW/kg (Calculated) : 59.2 HEPARIN  DW (KG): 66.8  Vital Signs: BP: 129/68 (09/23 1119) Pulse Rate: 79 (09/23 1119)  Labs: Recent Labs    11/22/23 0314 11/23/23 0459 11/24/23 0500  HGB 8.5*  --  9.3*  HCT 25.9*  --  29.2*  PLT 290  --  367  LABPROT 30.7* 29.7* 34.5*  INR 2.8* 2.7* 3.2*  CREATININE 0.91  --   --     Estimated Creatinine Clearance: 41.6 mL/min (by C-G formula based on SCr of 0.91 mg/dL).   Medical History: Past Medical History:  Diagnosis Date   Acute CHF (HCC) 05/06/2013   Acute respiratory failure with hypoxia (HCC) 04/24/2023   Aortic stenosis    a. s/p tissue AVR 05/2013;  b. Echo (06/2013):  Mod LVH, EF 60-65%, no RWMA, Robert [name] 2 DD, AVR ok (mean 13 mmHg), MAC, mild MR, mod LAE, mild RAE, PASP 46 mmHg (mild pulmo HTN)   Atrial fibrillation with RVR (HCC) 05/06/2013   Blood loss anemia    a. 05/2009 a/w GIB.   CAD (coronary artery disease)    a. Cath 04/2009: BMS to LCx 04/2009; CTO of RCA with L-R collaterals, 40% ostial diag.   Carpal tunnel syndrome    bilateral, carpal tunnel release x 2   Esophageal ulcer    a. 05/2009 with hemorrhage - injected with epinephrine -Dr. Lamar Bunk.   Esophagitis    a. 05/2009 a/w GIB.   GERD (gastroesophageal reflux disease)    EGD, Dr. Quay 2008   GI bleed    a. 05/2009 as above: with associated anemia. Lower esophageal ulcer with extensive erosive esophagitis and large clot by EGD 05/2009.    Hx of colonoscopy 2008   Dr. Cindy, polyps- 5 yr f/u recommended   Hyperlipidemia    Hypertension    Influenza A 04/24/2023   PAF (paroxysmal atrial fibrillation) (HCC)    a. Post-op AVR;  b.  04/2014 recurrent.   Perforated ear drum    right ear drum, tube in left ear drum, Dr. Jesus   Polymyalgia rheumatica    Prostate cancer [facility])    Vitamin D  deficiency     Assessment: Robert [name] is a 5 YOM with PMH significant for HTN, HLD, AF, CAD s/p CABG, aortic stenosis s/p AVR, PPM, and HFpEF.  Patient presents with a Chief Complaint of wound infection for this Admission.  Warfarin dosing per Pharmacy Consult placed for an indication of therapy as atrial fibrillation.   Goal of Therapy:  INR 2-3 Monitor platelets by anticoagulation protocol: Yes   Plan:  INR is supratherapeutic at 3.2 today.   HOLD warfarin dose tonight. Continue daily INR lab.  Robert [name], PharmD 11/24/2023,4:07 PM

## 2023-11-24 NOTE — Hospital Course (Addendum)
 CC: wound infection HPI: Robert [name] is a 89 y.o. male with medical history significant Patient is a 89 y.o.  male with history of HTN, HLD, A-fib on Coumadin , CAD s/p CABG, aortic stenosis-s/p aortic valve replacement, PPM implantation, chronic HFpEF who was recently hospitalized and then transition to hospital at home program-discharged on 9/6 for RLE ulcer with cellulitis-presented to the hospital with worsening right leg swelling/erythema and development of 2 additional ulcers.   Per history obtained-since this past Monday (9/15)-he developed 2 additional ulcers in his right leg-since then he has had some greenish appearing discharge from these ulcers.  His right leg has become more erythematous and swollen compared to the past few days.  He was seen by his primary care practitioner-superficial cultures were obtained-a referral to [name] was placed-he was subsequently restarted on an oral antibiotic.  Due to continued worsening of his right leg wound/erythema/swelling-he presented to the ED for evaluation-subsequently the hospitalist service was asked to admit this patient for further evaluation and treatment  Significant Events: Admitted 11/20/2023 for right lower leg cellulitis 11-23-2023 transferred to [facility] 11-24-2023 continued on IV vancomycin  11-26-2023 outpt wound cx grew pseudomonas. IV abx changed to meropenem  11-29-2023 pt completes last dose of IV meropenem (4 days of therapy). Pt discharged from [facility] program. Pt will start 4 days of Levaquin therapy on 11-30-2023.  Admission Labs: Na 138, K 3.6, CO2 of 29, BUN 41, scr 1.13, glu 108 T. Prot 6.3, alb 3.3, AST 21, ALT 18, alk phos 33, T. Bili 1.0 WBC 15.7, HgB 8.6, plt 304 Lactic acid 0.6  Antibiotic Therapy: Anti-infectives (From admission, onward)    Start     Dose/Rate Route Frequency Ordered Stop   11/23/23 2000  vancomycin  (VANCOREADY) IVPB 750 mg/150 mL        750 mg 150 mL/hr over 60  Minutes Intravenous Every 24 hours 11/23/23 1628     11/21/23 2015  vancomycin  (VANCOREADY) IVPB 750 mg/150 mL  Status:  Discontinued       Placed in Followed by Linked Group   750 mg 150 mL/hr over 60 Minutes Intravenous Every 24 hours 11/20/23 1924 11/23/23 1434   11/21/23 1800  vancomycin  (VANCOREADY) IVPB 750 mg/150 mL  Status:  Discontinued        750 mg 150 mL/hr over 60 Minutes Intravenous Every 24 hours 11/20/23 1744 11/20/23 1923   11/20/23 2015  vancomycin  (VANCOREADY) IVPB 1250 mg/250 mL       Placed in Followed by Linked Group   1,250 mg 166.7 mL/hr over 90 Minutes Intravenous  Once 11/20/23 1924 11/20/23 2158   11/20/23 1600  vancomycin  (VANCOREADY) IVPB 1250 mg/250 mL  Status:  Discontinued        1,250 mg 166.7 mL/hr over 90 Minutes Intravenous  Once 11/20/23 1545 11/20/23 1923   11/20/23 1545  vancomycin  (VANCOCIN ) IVPB 1000 mg/200 mL premix  Status:  Discontinued        1,000 mg 200 mL/hr over 60 Minutes Intravenous  Once 11/20/23 1537 11/20/23 1545       Procedures:   Consultants: Orthopedics(Duda) Vascular Surgery(Robins)

## 2023-11-24 NOTE — Plan of Care (Signed)
  Problem: Clinical Measurements: Goal: Ability to maintain clinical measurements within normal limits will improve Outcome: Progressing   Problem: Clinical Measurements: Goal: Will remain free from infection Outcome: Progressing   Problem: Nutrition: Goal: Adequate nutrition will be maintained Outcome: Progressing   Problem: Pain Managment: Goal: General experience of comfort will improve and/or be controlled Outcome: Progressing   Problem: Safety: Goal: Ability to remain free from injury will improve Outcome: Progressing

## 2023-11-24 NOTE — Progress Notes (Signed)
 Hospital at Home Daily Progress Note   Patient: Robert [name]  MRN: [id number]  DOB: 1927/07/01  DOA: 11/20/2023  DOS: the patient was seen and examined on @TODAY @    Patient identified themself as Robert [name]  DOB [birth year]  Medic Richerd Batty present at bedside during encounter and performed the physical exam. Patient was seen today via video chat; my physical location [place]   Brief hospital course:  89 y.o. male with medical history significant Patient is a 89 y.o.  male with history of HTN, HLD, A-fib on Coumadin , CAD s/p CABG, aortic stenosis-s/p aortic valve replacement, PPM implantation, chronic HFpEF who was recently hospitalized and then transition to hospital at home program-discharged on 9/6 for RLE ulcer with cellulitis-presented to the hospital with worsening right leg swelling/erythema and development of 2 additional ulcers.   Per history obtained-since this past Monday (9/15)-he developed 2 additional ulcers in his right leg-since then he has had some greenish appearing discharge from these ulcers.  His right leg has become more erythematous and swollen compared to the past few days.  He was seen by his primary care practitioner-superficial cultures were obtained-a referral to [name] was placed-he was subsequently restarted on Linezolid , but didn't start this prior to coming to ED.  Due to continued worsening of right leg erythema and swelling-he presented to the ED for evaluation-subsequently the hospitalist service was asked to admit this patient for further evaluation and treatment  Further hospital course and management as outlined below.  In summary, pt admitted to the hospital and was seen by Vascular and Orthopedics.  Started on IV Vancomycin .    On 9/22, patient was transferred to Hospital at [facility] program to complete remaining course of IV antibiotics through 9/26 given recurrence and recurrent failure of PO antibiotics in the setting of  peripheral vascular disease.   Assessment and Plan:  RLE ulceration with cellulitis 2 additional new ulcers in the past 1 week - [name] consulted, appreciate input.  ABI's noncompressible bilateral lower extremity arteries. -Ortho consulted vascular surgery.   Reportedly palpable DP pulse right foot. - Unna boots now in place.  Patient tolerating well with overall clinical improvement  - Unna boot changes twice weekly  -   conservative treatment with Unna boot plus antibiotics per vascular surgery -can arrange outpatient follow up with vascular surgery for angiogram as needed.  -Plan will be to transfer patient to hospital at home program and continue IV antibiotics vancomycin  for full 7 days course.  Stop date 9/26.   Leukocytosis: - Improving, continue IV antibiotics. -  as above plan will be continued IV antibiotics through hospital at home program.   PAF Sinus rhythm Continue digoxin /metoprolol  INR therapeutic-warfarin per pharmacy   History of tachybradycardia syndrome Pacemaker in place, no issues in house   CAD s/p CABG No anginal symptoms Continue statin/beta-blocker Suspect not on ASA as patient on Coumadin    History of aortic stenosis-s/p aortic valve replacement Supportive care   Chronic HFpEF Euvolemic-RLE swelling is not from CHF or from cellulitis/soft tissue infection Continue furosemide /beta-blocker. Weight today 66.8 kg  Daily weights while on the hospital at home program    HTN BP stable Continue metoprolol /Lasix     Normocytic anemia Secondary to chronic disease No evidence of blood loss Trend CBC   History of prostate cancer Lupron  every 6 months.   Patient Active Problem List   Diagnosis Date Noted   Cellulitis of right leg 11/02/2023    Priority: High  Chronic combined systolic and diastolic CHF (congestive heart failure) (HCC) 08/12/2023    Priority: Medium    Permanent atrial fibrillation (HCC) - . Left atrial appendage is  clipped. 04/21/2013    Priority: Medium    DNR (do not resuscitate)/DNI(Do Not Intubate) 11/07/2023    Priority: Low   S/P CABG x 1 08/12/2023    Priority: Low   Anemia of chronic disease 04/24/2023    Priority: Low   Pacemaker 09/06/2020    Priority: Low   S/P AVR - tissue AVR 05/2013 05/12/2013    Priority: Low   Aortic stenosis     Priority: Low   Coronary atherosclerosis of native coronary artery 04/21/2013    Priority: Low   Essential hypertension 04/21/2013    Priority: Low   HLD (hyperlipidemia) 04/21/2013    Priority: Low   GERD (gastroesophageal reflux disease) 04/21/2013    Priority: Low   Wound infection 11/21/2023   Cellulitis 11/20/2023   Carcinoma of prostate (HCC) 11/02/2023   Pleural effusion on right 04/24/2023   Subtherapeutic international normalized ratio (INR) 04/24/2023   Tachycardia-bradycardia syndrome (HCC) 05/03/2020   Secondary hypercoagulable state 04/04/2020   Mixed conductive and sensorineural hearing loss of both ears 09/14/2018   Encounter for therapeutic drug monitoring 06/02/2014   Chronic anticoagulation 05/17/2014   Atrial flutter (HCC) 06/08/2013           Subjective: Pt seen virtually this AM with his wife present.  He reports feeling overall well, okay so far.  Reports a little bit of leg pain, not much today.  Has occasional shock like pain, not frequent.  Slept okay, and reports good appetite.  Up to the bathroom 4-5 times from Lasix  last night.    Physical Exam:    11/24/2023   11:19 AM 11/23/2023    6:04 PM 11/23/2023    5:55 PM  Vitals with BMI  Height   5' 4  Weight   147 lbs 3 oz  BMI   25.25  Systolic 129  128  Diastolic 68  66  Pulse 79 73 73      General exam: awake, alert, no acute distress HEENT:moist mucus membranes, hard of hearing  Respiratory system: CTAB, normal respiratory effort. Cardiovascular system: normal S1/S2, RRR, no JVD.  Pacemaker noted. Central nervous system: A&O x3. no gross focal  neurologic deficits, normal speech Extremities: Unna boots on lower extremities Skin: images below Psychiatry: normal mood, congruent affect, judgement and insight appear normal              Data Reviewed:  Notable labs -- Cl 97, glucose 109, bun 25 otherwsie normal BMP   Family Communication: wife was present in the room during our virtual encounter today  Disposition: Status is: Inpatient Remains inpatient appropriate because: remains on IV antibiotics   Planned Discharge Destination: Home    Time spent: 45 minutes  Author: Burnard DELENA Cunning, DO [facility]  10/22/2023 7:00 AM  For on call review [email or web address].

## 2023-11-25 DIAGNOSIS — L0390 Cellulitis, unspecified: Secondary | ICD-10-CM

## 2023-11-25 LAB — PROTIME-INR
INR: 2.6 — ABNORMAL HIGH (ref 0.8–1.2)
Prothrombin Time: 28.8 s — ABNORMAL HIGH (ref 11.4–15.2)

## 2023-11-25 LAB — BASIC METABOLIC PANEL WITH GFR
Anion gap: 12 (ref 5–15)
BUN: 28 mg/dL — ABNORMAL HIGH (ref 8–23)
CO2: 28 mmol/L (ref 22–32)
Calcium: 9.2 mg/dL (ref 8.9–10.3)
Chloride: 98 mmol/L (ref 98–111)
Creatinine, Ser: 0.9 mg/dL (ref 0.61–1.24)
GFR, Estimated: >60 mL/min (ref >60)
Glucose, Bld: 102 mg/dL — ABNORMAL HIGH (ref 70–99)
Potassium: 3.7 mmol/L (ref 3.5–5.1)
Sodium: 138 mmol/L (ref 135–145)

## 2023-11-25 LAB — CBC
HCT: 28.3 % — ABNORMAL LOW (ref 39.0–52.0)
Hemoglobin: 9.1 g/dL — ABNORMAL LOW (ref 13.0–17.0)
MCH: 31 pg (ref 26.0–34.0)
MCHC: 32.2 g/dL (ref 30.0–36.0)
MCV: 96.3 fL (ref 80.0–100.0)
Platelets: 372 K/uL (ref 150–400)
RBC: 2.94 MIL/uL — ABNORMAL LOW (ref 4.22–5.81)
RDW: 16.5 % — ABNORMAL HIGH (ref 11.5–15.5)
WBC: 14.5 K/uL — ABNORMAL HIGH (ref 4.0–10.5)
nRBC: 0 % (ref 0.0–0.2)

## 2023-11-25 MED ORDER — MEDIHONEY WOUND/BURN DRESSING EX PSTE
1.0000 | PASTE | Freq: Every day | CUTANEOUS | Status: DC
  Administered 2023-11-26 – 2023-11-29 (×2): 1 via TOPICAL
  Filled 2023-11-25: qty 44

## 2023-11-25 MED ORDER — IPRATROPIUM-ALBUTEROL 0.5-2.5 (3) MG/3ML IN SOLN
3.0000 mL | RESPIRATORY_TRACT | Status: DC | PRN
  Filled 2023-11-25 (×7): qty 3

## 2023-11-25 MED ORDER — WARFARIN SODIUM 4 MG PO TABS
4.0000 mg | ORAL_TABLET | Freq: Every day | ORAL | Status: DC
  Administered 2023-11-25 – 2023-11-26 (×2): 4 mg via ORAL
  Filled 2023-11-25 (×3): qty 1

## 2023-11-25 NOTE — Assessment & Plan Note (Addendum)
 11/25/2023 Continue vancomycin  IV to complete 7 day course on 11/27/23  11/26/23 Reviewed [facility] cultures from 11-19-2023. Growing pseudomonas. Sensitive to cipro, levaquin and meropenem . Resistant to ceftaz and zosyn. Discussed with pharmacy. Still think his infection is most likely from gram positive organism but cannot ignore his outpatient culture data. Will change abx regimen over to IV meropenem  and po zyvox . Stop IV Vanco.  I think pt is going to have to delay discharge and keep him thru weekend to make sure wound is healing properly. best case scenario is discharge on Sun/Mon. will take off Unna boot tomorrow as planned to take pictures. and then remove again/reapply at discharge. then pt can f/u with orthopedics as scheduled on Tuesday, september 30.  11/27/23  Plan on IV meropenem  and po zyvox  thru "Sunday. Discharge on Sunday evening. Likely on po levaquin and po zyvox. Dtr states they already have a Rx for zyvox at home that was written by PCP. Discussed drug-drug interactions with levaquin and coumadin. Will hold coumadin while on levaquin for 4 days(Monday September 29 - October 2). Pt to have INR drawn in clinic on October 3 to restart coumadin. Paramedics will update pictures this evening after unna boot removed and then reapplied.  11/28/23 plan to continue IV meropenem today with last dose tomorrow afternoon. Start levaquin on Monday Sept 29 and continue for a total of 4 days. That will give him a total of 7 days of abx coverage for pseudomonas. He will f/u with Dr. Duda's clinic on Tuesday, September 30. Unna boot will be removed tomorrow so repeat pictures can be taken.  Paramedics will wash wound tomorrow with hibiclens soap prior to rebandage and reapplication of unna boot.  11-02-2023     11-03-2023     11-04-2023     11-05-2023     11-06-2023          11-07-2023          09" -23-2025  (Right posterior leg)      (Right anterior leg)  (Right lateral leg)  11-26-2023  (Right  posterior leg)  (Right anterior leg)  (Right lateral leg)

## 2023-11-25 NOTE — Progress Notes (Signed)
 PROGRESS NOTE    [name]   FMW:[id number]  DOB: June 03, 1927  PCP: Robert, [name]  E, PA    DOA: 11/20/2023 LOS: 5   Brief Narrative   CC: wound infection HPI: Robert [name] is a 89 y.o. male with medical history significant Patient is a 89 y.o.  male with history of HTN, HLD, A-fib on Coumadin , CAD s/p CABG, aortic stenosis-s/p aortic valve replacement, PPM implantation, chronic HFpEF who was recently hospitalized and then transition to hospital at home program-discharged on 9/6 for RLE ulcer with cellulitis-presented to the hospital with worsening right leg swelling/erythema and development of 2 additional ulcers.   Per history obtained-since this past Monday (9/15)-he developed 2 additional ulcers in his right leg-since then he has had some greenish appearing discharge from these ulcers.  His right leg has become more erythematous and swollen compared to the past few days.  He was seen by his primary care practitioner-superficial cultures were obtained-a referral to [name] was placed-he was subsequently restarted on an oral antibiotic.  Due to continued worsening of his right leg wound/erythema/swelling-he presented to the ED for evaluation-subsequently the hospitalist service was asked to admit this patient for further evaluation and treatment  Significant Events: Admitted 11/20/2023 for right lower leg cellulitis   Admission Labs: Na 138, K 3.6, CO2 of 29, BUN 41, scr 1.13, glu 108 T. Prot 6.3, alb 3.3, AST 21, ALT 18, alk phos 33, T. Bili 1.0 WBC 15.7, HgB 8.6, plt 304 Lactic acid 0.6  Antibiotic Therapy: Anti-infectives (From admission, onward)    Start     Dose/Rate Route Frequency Ordered Stop   11/23/23 2000  vancomycin  (VANCOREADY) IVPB 750 mg/150 mL        750 mg 150 mL/hr over 60 Minutes Intravenous Every 24 hours 11/23/23 1628     11/21/23 2015  vancomycin  (VANCOREADY) IVPB 750 mg/150 mL  Status:  Discontinued       Placed in Followed by Linked  Group   750 mg 150 mL/hr over 60 Minutes Intravenous Every 24 hours 11/20/23 1924 11/23/23 1434   11/21/23 1800  vancomycin  (VANCOREADY) IVPB 750 mg/150 mL  Status:  Discontinued        750 mg 150 mL/hr over 60 Minutes Intravenous Every 24 hours 11/20/23 1744 11/20/23 1923   11/20/23 2015  vancomycin  (VANCOREADY) IVPB 1250 mg/250 mL       Placed in Followed by Linked Group   1,250 mg 166.7 mL/hr over 90 Minutes Intravenous  Once 11/20/23 1924 11/20/23 2158   11/20/23 1600  vancomycin  (VANCOREADY) IVPB 1250 mg/250 mL  Status:  Discontinued        1,250 mg 166.7 mL/hr over 90 Minutes Intravenous  Once 11/20/23 1545 11/20/23 1923   11/20/23 1545  vancomycin  (VANCOCIN ) IVPB 1000 mg/200 mL premix  Status:  Discontinued        1,000 mg 200 mL/hr over 60 Minutes Intravenous  Once 11/20/23 1537 11/20/23 1545       Procedures:   Consultants: Orthopedics(Duda) Vascular Surgery(Robins)   Assessment & Plan   Principal Problem:   Cellulitis of right leg Active Problems:   Wound cellulitis   Permanent atrial fibrillation (HCC) - . Left atrial appendage is clipped.   Chronic anticoagulation   S/P CABG x 1   Chronic combined systolic and diastolic CHF (congestive heart failure) (HCC)   Essential hypertension   HLD (hyperlipidemia)   Aortic stenosis   Pacemaker   GERD (gastroesophageal reflux disease)   Carcinoma of  prostate (HCC)   DNR (do not resuscitate)/DNI(Do Not Intubate)   Assessment and Plan:  * Cellulitis of right leg Continue vancomycin  IV to complete 7 day course on 11/27/23  Wound cellulitis Of the RLE Continue Unna boot per orthopedic and vascular recommendation Per Dr. Lanis (vascular) who was consulted by [name] (orthopedic), recommends to continue conservative management given pt's age. Should the wound worsen or healing is stagnate, Dr. Lanis can see patient outpatient for consideration of vascular intervention Wound care consulted, images in Epic media.  Wound care RN recommends medihoney. Given unna boot is recommended to stay on until Thursday, we will assess wound healing at that time. If unna boot needs to be removed, then we can proceed with medihoney. Discussed with RN.   Per vascular on 11/23/23: 'He does have a toe pressure of 70 on the right. Given the fact he is on coumadin  for afib and INR is 2.7, he is 89 y/o with hx of HF and PPM and has a palpable right DP pulse and toe pressure of 70, feel it is reasonable to see if the ulcers improve with compression wrap.'  Chronic combined systolic and diastolic CHF (congestive heart failure) (HCC) Metolazone  2.5 mg every Monday  Permanent atrial fibrillation (HCC) - . Left atrial appendage is clipped. Home digoxin  0.125 mg qevening, metoprolol  50 mg daily resumed  GERD (gastroesophageal reflux disease) PPI  Essential hypertension Home metoprolol  succinate 50 mg daily resumed  Patient BMI: Body mass index is 25.27 kg/m.   DVT prophylaxis: Warfarin per pharmacy Diet:  Diet Orders (From admission, onward)     Start     Ordered   11/20/23 1710  Diet Heart Room service appropriate? Yes; Fluid consistency: Thin; Fluid restriction: 1500 mL Fluid  Diet effective now       Question Answer Comment  Room service appropriate? Yes   Fluid consistency: Thin   Fluid restriction: 1500 mL Fluid      11/20/23 1710              Code Status: Limited: Do not attempt resuscitation (DNR) -DNR-LIMITED -Do Not Intubate/DNI     Subjective 11/25/23    At bedside, patient was able to confirm his full name, birthday. Daughter, Robert [name] at bedside. Patient reports he is doing well and does not acute concerns. He reports he slept well as he only had to get up 2x last night to urinate.   Disposition Plan & Communication   Dispo & Barriers: completion of IV abx Coming from: inpatient hospital Exp d/c date: 11/27/23, evening Medically stable for d/c? no  Family Communication: updated daughter Robert [name]  at bedside   Consults, Procedures, Significant Events   Consultants:  Orthopedic, vascular, wound care, toc  Procedures:  none  Antimicrobials:  Vancomycin  per pharmacy   Significant Events: None reported  Objective   Vitals:   11/24/23 1905 11/24/23 2023 11/25/23 0006 11/25/23 0900  BP: 114/68   114/64  Pulse: 66  63 70  Resp: 16  20 18   Temp: 99 F (37.2 C)   97.9 F (36.6 C)  TempSrc: Oral   Oral  SpO2:  98% 98% 95%  Weight:      Height:        Intake/Output Summary (Last 24 hours) at 11/25/2023 1221 Last data filed at 11/24/2023 1700 Gross per 24 hour  Intake 150 ml  Output --  Net 150 ml   Filed Weights   11/20/23 1352 11/23/23 1755  Weight: 66.7 kg 66.8  kg   Physical Exam: Lauraine Faes was paramedic performing the physical exam  General exam: awake, alert, no acute distress HEENT: atraumatic, clear conjunctiva, anicteric sclera, moist mucus membranes, hearing grossly normal  Respiratory system: CTAB, no wheezes, rales or rhonchi, normal respiratory effort. Cardiovascular system:  RRR   Gastrointestinal system: soft, NT, ND, no HSM felt, +bowel sounds. Central nervous system: A&O x3. no gross focal neurologic deficits, normal speech Extremities: moves all extremities, + RLE edema, normal tone Skin: dry, intact, normal temperature, normal color, no rashes, lesions or ulcers Psychiatry: normal mood, congruent affect, judgement and insight appear normal  Labs   Data Reviewed: I have personally reviewed following labs and imaging studies  CBC: Recent Labs  Lab 11/20/23 1356 11/20/23 1404 11/21/23 0749 11/22/23 0314 11/24/23 0500 11/25/23 1006  WBC 15.7*  --  17.0* 13.3* 15.0* 14.5*  NEUTROABS 7.2  --   --   --   --   --   HGB 8.6* 8.2* 8.7* 8.5* 9.3* 9.1*  HCT 26.8* 24.0* 26.7* 25.9* 29.2* 28.3*  MCV 97.8  --  97.4 97.0 98.6 96.3  PLT 304  --  335 290 367 372   Basic Metabolic Panel: Recent Labs  Lab 11/20/23 1356 11/20/23 1404  11/21/23 0749 11/22/23 0314 11/24/23 1355 11/25/23 1006  NA 138 136 138 139 138 138  K 3.6 3.6 3.7 3.5 4.0 3.7  CL 95* 95* 96* 98 97* 98  CO2 29  --  29 29 30 28   GLUCOSE 108* 107* 110* 125* 109* 102*  BUN 41* 38* 35* 29* 25* 28*  CREATININE 1.13 1.10 1.01 0.91 1.04 0.90  CALCIUM  9.0  --  8.9 8.6* 9.1 9.2   GFR: Estimated Creatinine Clearance: 42 mL/min (by C-G formula based on SCr of 0.9 mg/dL).  Liver Function Tests: Recent Labs  Lab 11/20/23 1356  AST 21  ALT 18  ALKPHOS 33*  BILITOT 1.0  PROT 6.3*  ALBUMIN  3.3*   Coagulation Profile: Recent Labs  Lab 11/21/23 0749 11/22/23 0314 11/23/23 0459 11/24/23 0500 11/25/23 1006  INR 2.2* 2.8* 2.7* 3.2* 2.6*   Sepsis Labs: Recent Labs  Lab 11/20/23 1412 11/20/23 1625  LATICACIDVEN 0.6 0.9   No results found for this or any previous visit (from the past 240 hours).   Imaging Studies   No results found.  Medications   Scheduled Meds:  cholecalciferol   5,000 Units Oral Once per day on Monday Thursday   digoxin   0.125 mg Oral QPM   furosemide   80 mg Oral BID   Influenza vac split trivalent PF  0.5 mL Intramuscular Tomorrow-1000   leptospermum manuka honey  1 Application Topical Daily   metolazone   2.5 mg Oral Q Mon   metoprolol  succinate  50 mg Oral Daily   pantoprazole   40 mg Oral Daily   warfarin  4 mg Oral q1600   Warfarin - Pharmacist Dosing Inpatient   Does not apply q1600   Continuous Infusions:  vancomycin  Stopped (11/24/23 2016)    LOS: 5 days   Time spent: 68  Dr. Sherre Location: [place]  [facility]  If 7PM-7AM, please contact night-coverage [email or web address] 11/25/2023, 12:21 PM

## 2023-11-25 NOTE — Assessment & Plan Note (Addendum)
 Prior to 11/26/2023 Of the RLE Continue Unna boot per orthopedic and vascular recommendation Per Dr. Lanis (vascular) who was consulted by [name] (orthopedic), recommends to continue conservative management given pt's age. Should the wound worsen or healing is stagnate, Dr. Lanis can see patient outpatient for consideration of vascular intervention Wound care consulted, images in Epic media. Wound care RN recommends medihoney. Given unna boot is recommended to stay on until Thursday, we will assess wound healing at that time. If unna boot needs to be removed, then we can proceed with medihoney. Discussed with RN.   Per vascular on 11/23/23: 'He does have a toe pressure of 70 on the right. Given the fact he is on coumadin  for afib and INR is 2.7, he is 89 y/o with hx of HF and PPM and has a palpable right DP pulse and toe pressure of 70, feel it is reasonable to see if the ulcers improve with compression wrap.'  11/26/23 Reviewed [facility] cultures from 11-19-2023. Growing pseudomonas. Sensitive to cipro, levaquin and meropenem . Resistant to ceftaz and zosyn. Discussed with pharmacy. Still think his infection is most likely from gram positive organism but cannot ignore his outpatient culture data. Will change abx regimen over to IV meropenem  and po zyvox . Stop IV Vanco.  I think pt is going to have to delay discharge and keep him thru weekend to make sure wound is healing properly. best case scenario is discharge on Sun/Mon. will take off Unna boot tomorrow as planned to take pictures. and then remove again/reapply at discharge. then pt can f/u with orthopedics as scheduled on Tuesday, september 30.   11/27/23  Plan on IV meropenem  and po zyvox  thru Sunday. Discharge on Sunday evening. Likely on po levaquin and po zyvox . Dtr states they already have a Rx for zyvox  at home that was written by PCP. Discussed drug-drug interactions with levaquin and coumadin . Will hold coumadin  while on levaquin for 4  days(Monday September 29 - October 2). Pt to have INR drawn in clinic on October 3 to restart coumadin . Paramedics will update pictures this evening after unna boot removed and then reapplied.  11/28/23 plan to continue IV meropenem  today with last dose tomorrow afternoon. Start levaquin on Monday Sept 29 and continue for a total of 4 days. That will give him a total of 7 days of abx coverage for pseudomonas. He will f/u with Dr. Crist clinic on Tuesday, September 30. Unna boot will be removed tomorrow so repeat pictures can be taken.  Paramedics will wash wound tomorrow with hibiclens  soap prior to rebandage and reapplication of unna boot.   11/29/23 last day of IV meropenem (Day #4). Pt to start levaquin tomorrow. Levaquin 500 mg qday for 4 days. Tonight is last dose of coumadin . Hold coumadin  starting tomorrow. Dtr to call cardiology office to see if coumadin  needs to be restarted after finishing levaquin. Pt to f/u with Dr. Crist office on Tuesday December 01, 2023 for wound evaluate. Unna boot removed today for wound cleaning and inspection. Unna boot will be replaced after wound cleaning. New pictures taken of wounds. Maybe pt can be enrolled into Dr. Crist wound care research trial.

## 2023-11-25 NOTE — Plan of Care (Signed)

## 2023-11-25 NOTE — Assessment & Plan Note (Addendum)
 11/25/2023 Home digoxin  0.125 mg qevening, metoprolol  50 mg daily resumed  11/26/23 stable. Continue digoxin .  11/27/23 stable. On digoxin  and coumadin .  Will hold coumadin  while he is on Levaquin(for 4 days) after discharge. He can go to get his INR checked on October 3 and restart coumadin . In theory, pt could stop systemic anticoagulation altogether. Dtr scarlett doesn't want to do this until pt f/u with his cardiologist Dr. Lorrane. I urged dtr to have pt f/u with cardiologist ASAP.  11/28/23 continue coumadin  today and tomorrow. Will hold starting Monday night(September 20) while he is taking levaquin. Dtr to call cardiologist office to see what they want to do about continuing coumadin . Holding coumadin  as not to risk supertherapeutic INR while taking Levaquin after discharge.  11/29/23 last dose of coumadin  tonight. Hold coumadin  starting tomorrow due to pt being on Levaquin.  Dtr to call cardiology office to see if coumadin  needs to be restarted after finishing levaquin. Pt with left atrial appendage clipped. Theoretically, pt does not need systemic anticoagulation for afib now. Continue Toprol -XL 50 mg daily and digoxin  0.125 mg daily.

## 2023-11-25 NOTE — Assessment & Plan Note (Addendum)
 11/26/23 stable. On PPI  11/27/23 stable  11/28/23 stable  11/29/23 stable. Continue protonix  40 mg daily.

## 2023-11-25 NOTE — Progress Notes (Signed)
 Arrived to find the Pt sitting on his recliner in no obvious distress. Vitals were assessed. Meds were administered with nurse. IV was assessed and IV antibiotics was administered. Pt stated that he's been a little constipated today but wanted to hold off on miralax . Iv was flushed and cap was placed.

## 2023-11-25 NOTE — Progress Notes (Signed)
 PHARMACY - ANTICOAGULATION CONSULT NOTE  Pharmacy Consult for Warfarin Indication: atrial fibrillation  Allergies  Allergen Reactions   Amoxicillin Swelling    penile swelling   Lisinopril Cough   Kenalog  [Triamcinolone ] Rash    Patient Measurements: Height: 5' 4 (162.6 cm) Weight: 66.8 kg (147 lb 3.2 oz) IBW/kg (Calculated) : 59.2 HEPARIN  DW (KG): 66.8  Vital Signs: Temp: 97.9 F (36.6 C) (09/24 0900) Temp Source: Oral (09/24 0900) BP: 114/64 (09/24 0900) Pulse Rate: 70 (09/24 0900)  Labs: Recent Labs    11/23/23 0459 11/24/23 0500 11/24/23 1355 11/25/23 1006  HGB  --  9.3*  --  9.1*  HCT  --  29.2*  --  28.3*  PLT  --  367  --  372  LABPROT 29.7* 34.5*  --  28.8*  INR 2.7* 3.2*  --  2.6*  CREATININE  --   --  1.04 0.90    Estimated Creatinine Clearance: 42 mL/min (by C-G formula based on SCr of 0.9 mg/dL).   Medical History: Past Medical History:  Diagnosis Date   Acute CHF (HCC) 05/06/2013   Acute respiratory failure with hypoxia (HCC) 04/24/2023   Aortic stenosis    a. s/p tissue AVR 05/2013;  b. Echo (06/2013):  Mod LVH, EF 60-65%, no RWMA, Robert [name] 2 DD, AVR ok (mean 13 mmHg), MAC, mild MR, mod LAE, mild RAE, PASP 46 mmHg (mild pulmo HTN)   Atrial fibrillation with RVR (HCC) 05/06/2013   Blood loss anemia    a. 05/2009 a/w GIB.   CAD (coronary artery disease)    a. Cath 04/2009: BMS to LCx 04/2009; CTO of RCA with L-R collaterals, 40% ostial diag.   Carpal tunnel syndrome    bilateral, carpal tunnel release x 2   Esophageal ulcer    a. 05/2009 with hemorrhage - injected with epinephrine -Dr. Lamar Bunk.   Esophagitis    a. 05/2009 a/w GIB.   GERD (gastroesophageal reflux disease)    EGD, Dr. Quay 2008   GI bleed    a. 05/2009 as above: with associated anemia. Lower esophageal ulcer with extensive erosive esophagitis and large clot by EGD 05/2009.    Hx of colonoscopy 2008   Dr. Cindy, polyps- 5 yr f/u recommended   Hyperlipidemia     Hypertension    Influenza A 04/24/2023   PAF (paroxysmal atrial fibrillation) (HCC)    a. Post-op AVR;  b. 04/2014 recurrent.   Perforated ear drum    right ear drum, tube in left ear drum, Dr. Jesus   Polymyalgia rheumatica    Prostate cancer [facility])    Vitamin D  deficiency     Assessment: Robert [name] is a 12 YOM with PMH significant for HTN, HLD, AF, CAD s/p CABG, aortic stenosis s/p AVR, PPM, and HFpEF.  Patient presents with a Chief Complaint of wound infection for this Admission.  Warfarin dosing per Pharmacy Consult placed for an indication of therapy as atrial fibrillation.   Goal of Therapy:  INR 2-3 Monitor platelets by anticoagulation protocol: Yes   Plan:  INR is Therapeutic Will resume warfarin at lower dose of 4 mg daily. Continue daily INR lab.  [name], RPh 11/25/2023,1:29 PM

## 2023-11-25 NOTE — Assessment & Plan Note (Addendum)
 11/25/2023 Home metoprolol  succinate 50 mg daily  11/26/23 stable. Continue toprol -XL 50 mg daily.  11/27/23 stable  11/28/23 stable  11/29/23 continue Toprol -XL 50 mg daily, lasix  80 mg bid.

## 2023-11-25 NOTE — Plan of Care (Signed)
  Problem: Education: Goal: Knowledge of General Education information will improve Description: Including pain rating scale, medication(s)/side effects and non-pharmacologic comfort measures Outcome: Progressing   Problem: Clinical Measurements: Goal: Will remain free from infection Outcome: Progressing   Problem: Nutrition: Goal: Adequate nutrition will be maintained Outcome: Progressing   Problem: Safety: Goal: Ability to remain free from injury will improve Outcome: Progressing   Problem: Clinical Measurements: Goal: Ability to avoid or minimize complications of infection will improve Outcome: Progressing

## 2023-11-25 NOTE — Progress Notes (Signed)
 Pharmacy Antibiotic Note  Robert [name] is a 89 y.o. male for which pharmacy has been consulted for vancomycin  dosing for cellulitis.  Patient with a history of HTN, HLD, AF on coumadin , CAD s/p CABG, aortic stenosis s/p AVR, PPM, HFpEF. Patient with wound infection.  SCr 0.9 WBC 14.5; T 97.9; HR 70; RR 18  Plan: Continue with vancomycin  750 mg IV q24 hr. Monitor WBC, fever, renal function, cultures  Height: 5' 4 (162.6 cm) Weight: 66.8 kg (147 lb 3.2 oz) IBW/kg (Calculated) : 59.2  Temp (24hrs), Avg:98.5 F (36.9 C), Min:97.9 F (36.6 C), Max:99 F (37.2 C)  Recent Labs  Lab 11/20/23 1356 11/20/23 1404 11/20/23 1412 11/20/23 1625 11/21/23 0749 11/22/23 0314 11/24/23 0500 11/24/23 1355 11/25/23 1006  WBC 15.7*  --   --   --  17.0* 13.3* 15.0*  --  14.5*  CREATININE 1.13 1.10  --   --  1.01 0.91  --  1.04 0.90  LATICACIDVEN  --   --  0.6 0.9  --   --   --   --   --     Estimated Creatinine Clearance: 42 mL/min (by C-G formula based on SCr of 0.9 mg/dL).    Allergies  Allergen Reactions   Amoxicillin Swelling    penile swelling   Lisinopril Cough   Kenalog  [Triamcinolone ] Rash    Thank you for allowing pharmacy to be a part of this patient's care.  Dorn Buttner, PharmD, BCPS 11/25/2023 1:40 PM ED Clinical Pharmacist -  [phone]

## 2023-11-25 NOTE — Consult Note (Signed)
 WOC Nurse Consult Note: HEALTH AT HOME REMOTE CONSULT Reason for Consult:Bilateral lower legs with skin breakdown, full thickness with eschar present. Recent ABI shows both legs are not compressible.  Skin tears to bilateral forearms from presumed trauma.  History falls/debility  Wound type:trauma  in the setting of mixed venous and arterial disease.  Pressure Injury POA: NA Measurement: See photos and RN assessment from home.   Wound azi:uypw black eschar Drainage (amount, consistency, odor) none noted Periwound: Dry thin fragile skin  impaired pulses noted at last Orthopedic appointment.  ABI 11/21/23 revealed noncompressible vessels.  Has not had follow up appointment with Dr Harden yet.  Dressing procedure/placement/frequency: No compression to lower legs given recent ABI results.  Cleanse arm and leg wounds with Ns and pat dry.  Apply medihoney to wound bed.  Cover with gauze and kerlix/tape.  Change daily.  Will not follow at this time.  Please re-consult if needed.  Darice Cooley MSN, RN, FNP-BC CWON Wound, Ostomy, Continence Nurse Outpatient [facility] [phone] Work cell phone:  [phone]

## 2023-11-25 NOTE — Progress Notes (Signed)
 Patient reports right leg pain. PRN tylenol  given. Unna boot remains in place. No complaints of headache, dizziness, or SOB. Plan of care reviewed with patient and the patient's daughter [name].

## 2023-11-25 NOTE — Progress Notes (Signed)
 1930--Patient identifiers review  patient A&O hard of hearing. Caregiver/spouse with patient a Patient/caregiver denies current health or wound concerns, pain, or any current concerns to address. Patient pain reported at 0. No medication scheduled or administration assistance provided. No other medications scheduled for tonight. Assessment completed. Overnight monitoring plan and request to contact daughter when possible reviewed.  Patient /caregiver encouraged to call as needed, patient will continue to be monitored via current health monitoring.

## 2023-11-25 NOTE — Assessment & Plan Note (Addendum)
 11/26/23 stable. Not exacerbated. On lasix  80 mg bid. And Metolazone  2.5 mg every Monday  11/27/23 stable  11/28/23 stable  11/29/23 stable. Continue with lasix  80 mg bid. And Glorya metolazone  2.5 mg

## 2023-11-25 NOTE — Progress Notes (Signed)
 Pt seen for routine [facility] morning visit. Pt appears well and is sitting in recliner with feet elevated. Pt states he slept well and only woke up twice to pee throughout the night. Pt's wife and daughter are present.  Vital signs and assessment obtained as noted. Pt's RLE is wrapped with Una boot and LLE does not appear to have an edema at this time. Bandage on left arm intact still.   Medications administered as noted.   Pt had visit with virtual RN and MD on tablet.   Labs drawn and IV care completed including new curos cap.   I informed Pt I will be back this afternoon.

## 2023-11-26 DIAGNOSIS — L0390 Cellulitis, unspecified: Secondary | ICD-10-CM

## 2023-11-26 DIAGNOSIS — I10 Essential (primary) hypertension: Secondary | ICD-10-CM

## 2023-11-26 DIAGNOSIS — I4821 Permanent atrial fibrillation: Secondary | ICD-10-CM

## 2023-11-26 DIAGNOSIS — Z66 Do not resuscitate: Secondary | ICD-10-CM

## 2023-11-26 DIAGNOSIS — I5042 Chronic combined systolic (congestive) and diastolic (congestive) heart failure: Secondary | ICD-10-CM

## 2023-11-26 DIAGNOSIS — Z7901 Long term (current) use of anticoagulants: Secondary | ICD-10-CM

## 2023-11-26 LAB — CBC
HCT: 29.2 % — ABNORMAL LOW (ref 39.0–52.0)
Hemoglobin: 9.4 g/dL — ABNORMAL LOW (ref 13.0–17.0)
MCH: 31.1 pg (ref 26.0–34.0)
MCHC: 32.2 g/dL (ref 30.0–36.0)
MCV: 96.7 fL (ref 80.0–100.0)
Platelets: 352 K/uL (ref 150–400)
RBC: 3.02 MIL/uL — ABNORMAL LOW (ref 4.22–5.81)
RDW: 16.6 % — ABNORMAL HIGH (ref 11.5–15.5)
WBC: 14.5 K/uL — ABNORMAL HIGH (ref 4.0–10.5)
nRBC: 0 % (ref 0.0–0.2)

## 2023-11-26 LAB — BASIC METABOLIC PANEL WITH GFR
Anion gap: 16 — ABNORMAL HIGH (ref 5–15)
BUN: 31 mg/dL — ABNORMAL HIGH (ref 8–23)
CO2: 27 mmol/L (ref 22–32)
Calcium: 9 mg/dL (ref 8.9–10.3)
Chloride: 95 mmol/L — ABNORMAL LOW (ref 98–111)
Creatinine, Ser: 0.9 mg/dL (ref 0.61–1.24)
GFR, Estimated: >60 mL/min (ref >60)
Glucose, Bld: 111 mg/dL — ABNORMAL HIGH (ref 70–99)
Potassium: 3.5 mmol/L (ref 3.5–5.1)
Sodium: 138 mmol/L (ref 135–145)

## 2023-11-26 LAB — PROTIME-INR
INR: 2.3 — ABNORMAL HIGH (ref 0.8–1.2)
Prothrombin Time: 26 s — ABNORMAL HIGH (ref 11.4–15.2)

## 2023-11-26 MED ORDER — ACETAMINOPHEN 650 MG RE SUPP
650.0000 mg | Freq: Four times a day (QID) | RECTAL | Status: DC | PRN
  Filled 2023-11-26: qty 1

## 2023-11-26 MED ORDER — SODIUM CHLORIDE 0.9 % IV SOLN
1.0000 g | Freq: Two times a day (BID) | INTRAVENOUS | Status: DC
  Administered 2023-11-26 – 2023-11-29 (×6): 1 g via INTRAVENOUS
  Filled 2023-11-26 (×8): qty 20

## 2023-11-26 MED ORDER — ACETAMINOPHEN 500 MG PO TABS
500.0000 mg | ORAL_TABLET | Freq: Once | ORAL | Status: AC
  Administered 2023-11-26: 500 mg via ORAL

## 2023-11-26 MED ORDER — ACETAMINOPHEN 500 MG PO TABS
1000.0000 mg | ORAL_TABLET | Freq: Four times a day (QID) | ORAL | Status: DC | PRN
  Administered 2023-11-26 – 2023-11-29 (×4): 1000 mg via ORAL
  Filled 2023-11-26 (×13): qty 2

## 2023-11-26 MED ORDER — GERHARDT'S BUTT CREAM
TOPICAL_CREAM | Freq: Two times a day (BID) | CUTANEOUS | Status: DC
  Filled 2023-11-26: qty 20

## 2023-11-26 MED ORDER — LINEZOLID 600 MG PO TABS
600.0000 mg | ORAL_TABLET | Freq: Two times a day (BID) | ORAL | Status: DC
  Administered 2023-11-26 – 2023-11-29 (×6): 600 mg via ORAL
  Filled 2023-11-26 (×8): qty 1

## 2023-11-26 MED ORDER — DOCUSATE SODIUM 100 MG PO CAPS
100.0000 mg | ORAL_CAPSULE | Freq: Two times a day (BID) | ORAL | Status: DC
  Administered 2023-11-26 – 2023-11-27 (×3): 100 mg via ORAL
  Filled 2023-11-26 (×9): qty 1

## 2023-11-26 NOTE — Assessment & Plan Note (Addendum)
 11/26/23 stable  11/27/23 stable  11/28/23 stable  11/29/23 stable

## 2023-11-26 NOTE — Progress Notes (Signed)
 PHARMACY - ANTICOAGULATION CONSULT NOTE  Pharmacy Consult for Warfarin Indication: atrial fibrillation  Allergies  Allergen Reactions   Amoxicillin Swelling    penile swelling   Lisinopril Cough   Kenalog  [Triamcinolone ] Rash    Patient Measurements: Height: 5' 4 (162.6 cm) Weight: 66.8 kg (147 lb 3.2 oz) IBW/kg (Calculated) : 59.2 HEPARIN  DW (KG): 66.8  Vital Signs: Temp: 98.1 F (36.7 C) (09/25 0900) BP: 123/71 (09/25 0900) Pulse Rate: 70 (09/25 0900)  Labs: Recent Labs    11/24/23 0500 11/24/23 1355 11/25/23 1006 11/26/23 1017  HGB 9.3*  --  9.1* 9.4*  HCT 29.2*  --  28.3* 29.2*  PLT 367  --  372 352  LABPROT 34.5*  --  28.8* 26.0*  INR 3.2*  --  2.6* 2.3*  CREATININE  --  1.04 0.90 0.90    Estimated Creatinine Clearance: 42 mL/min (by C-G formula based on SCr of 0.9 mg/dL).   Medical History: Past Medical History:  Diagnosis Date   Acute CHF (HCC) 05/06/2013   Acute respiratory failure with hypoxia (HCC) 04/24/2023   Aortic stenosis    a. s/p tissue AVR 05/2013;  b. Echo (06/2013):  Mod LVH, EF 60-65%, no RWMA, Robert [name] 2 DD, AVR ok (mean 13 mmHg), MAC, mild MR, mod LAE, mild RAE, PASP 46 mmHg (mild pulmo HTN)   Atrial fibrillation with RVR (HCC) 05/06/2013   Blood loss anemia    a. 05/2009 a/w GIB.   CAD (coronary artery disease)    a. Cath 04/2009: BMS to LCx 04/2009; CTO of RCA with L-R collaterals, 40% ostial diag.   Carpal tunnel syndrome    bilateral, carpal tunnel release x 2   Esophageal ulcer    a. 05/2009 with hemorrhage - injected with epinephrine -Dr. Lamar Buccini.   Esophagitis    a. 05/2009 a/w GIB.   GERD (gastroesophageal reflux disease)    EGD, Dr. Quay 2008   GI bleed    a. 05/2009 as above: with associated anemia. Lower esophageal ulcer with extensive erosive esophagitis and large clot by EGD 05/2009.    Hx of colonoscopy 2008   Dr. Cindy, polyps- 5 yr f/u recommended   Hyperlipidemia    Hypertension    Influenza A  04/24/2023   PAF (paroxysmal atrial fibrillation) (HCC)    a. Post-op AVR;  b. 04/2014 recurrent.   Perforated ear drum    right ear drum, tube in left ear drum, Dr. Jesus   Polymyalgia rheumatica    Prostate cancer [facility])    Vitamin D  deficiency    Assessment: Robert [name] is a 46 YOM with PMH significant for HTN, HLD, AF, CAD s/p CABG, aortic stenosis s/p AVR, PPM, and HFpEF. Patient presents with a Chief Complaint of wound infection for this Admission. Warfarin dosing per Pharmacy Consult placed for an indication of therapy as atrial fibrillation.  Goal of Therapy:  INR 2-3 Monitor platelets by anticoagulation protocol: Yes   Plan:  INR is therapeutic. Continue warfarin 4 mg PO daily. Continue monitoring daily INR lab.   [name], PharmD 11/26/2023,12:09 PM

## 2023-11-26 NOTE — Subjective & Objective (Addendum)
 Pt seen today for video call. Dtr scarlett present during call. Paramedic performed physical exam. Documented in this note for clarity  Pt had some shooting pain in right leg with unna boot for a few hours last night. Took tylenol . Pain went away. No fevers.  Discussed with dtr plan on changing to po levaquin after discharge and holding coumadin . She states she will call pt's cardiologist as they were already thinking about stopping coumadin  treatment since he has already had left atrial appendage clipping when he had CABG.

## 2023-11-26 NOTE — Progress Notes (Signed)
 Phone call completed with patient and his wife. Patient denies any pain or discomfort at this time. Wife states that he's eating cookies. Plan is for patient to try sleeping in bed tonight. Encouraged them to call RN if any issues arise overnight.

## 2023-11-26 NOTE — Progress Notes (Signed)
 Alarm Hypoxia (modified) for SpO2--Patient showing considerable movement and fluctuating  SPO2 readings patient now showing 96% as of  Sep 25, 12:22 AM. No outreach, patient will continue to be monitored for need for outreach.

## 2023-11-26 NOTE — Progress Notes (Signed)
 Pt seen for routine [facility] morning visit. Pt appears well and is sitting in recliner with legs in dependant position. Pt states he slept fairly well last night. Pt's wife and daughter [name] present for encounter. [name] states she had a question about culture results from wound that were obtained by Pt's PCP last week. I notified Dr. Laurence and Shanda, RN of same.  Vital signs and assessment obtained as noted. Iv acer completed including new curos cap.  Pt had virtual visit with Dr. Laurence and virtual RN, Shanda.   Una boot left in place on Pt's right leg. No edema noted to Pt's left leg. Meplex removed from wounds on arms. New photos of same uploaded to media tab. I cleansed all sites on arms with NS, applied medihoney to wound beds only. I covered wound on Pt's left arm with telfa and wrapped with coban. I applied square mepilex to wounds on Pt's right arm.  Medications administered as noted. Labs drawn using sunquest collect for labels. Each label and tube verified with Logan Mantle, EMT. 1 lav, 1 lt green and 1 lt blue tube obtained.   Photos of Pt's home supply of tylenol  uploaded to media tab for pharmacy verification for one time dose.   Pt's wife told me she noticed an area near Pt's tailbone where the skin was getting red and she was concerned of a possible pressure ulcer. I looked and uploaded a photo of same to media tab. Pt declined sacral mepilex at this time. I gave Pt a chair cushion with hole near sacra area to decrease pressure and encouraged changing positions as often as possible. I told him and wife that I will notify the team and see if there are any other recommendations from MD or wound care nurse.   IMM letter signed and one copy left with pt and one brought back to hub and placed in Pt's shadow chart.   I encouraged Pt and wife to call back with any problems or questions and we will be back later for an evening visit.

## 2023-11-26 NOTE — Plan of Care (Signed)
  Problem: Education: Goal: Knowledge of General Education information will improve Description: Including pain rating scale, medication(s)/side effects and non-pharmacologic comfort measures Outcome: Progressing   Problem: Health Behavior/Discharge Planning: Goal: Ability to manage health-related needs will improve Outcome: Progressing   Problem: Clinical Measurements: Goal: Ability to maintain clinical measurements within normal limits will improve Outcome: Progressing Goal: Diagnostic test results will improve Outcome: Progressing Goal: Respiratory complications will improve Outcome: Progressing Goal: Cardiovascular complication will be avoided Outcome: Progressing   Problem: Activity: Goal: Risk for activity intolerance will decrease Outcome: Progressing   Problem: Nutrition: Goal: Adequate nutrition will be maintained Outcome: Progressing   Problem: Coping: Goal: Level of anxiety will decrease Outcome: Progressing   Problem: Elimination: Goal: Will not experience complications related to bowel motility Outcome: Progressing   Problem: Pain Managment: Goal: General experience of comfort will improve and/or be controlled Outcome: Progressing   Problem: Safety: Goal: Ability to remain free from injury will improve Outcome: Progressing   Problem: Skin Integrity: Goal: Risk for impaired skin integrity will decrease Outcome: Progressing   Problem: Clinical Measurements: Goal: Ability to avoid or minimize complications of infection will improve Outcome: Progressing   Problem: Skin Integrity: Goal: Skin integrity will improve Outcome: Progressing

## 2023-11-26 NOTE — Progress Notes (Signed)
 [facility] PROGRESS NOTE    Robert [name]  FMW:[id number] DOB: February 25, 1928 DOA: 11/20/2023 PCP: [name], [name]  E, PA  Subjective: Pt seen today with [facility] program. Pt examined by paramedic on-site. Documented in this note for clarity.  Paramedic states pt's dtr left a message that pt's outpatient wound cx were ready for review.  Reviewed [facility] cultures from 11-19-2023. Growing pseudomonas. Sensitive to cipro, levaquin and meropenem . Resistant to ceftaz and zosyn.  Discussed with pharmacy. Still think his infection is most likely from gram positive organism but cannot ignore his outpatient culture data. Will change abx regimen over to IV meropenem  and po zyvox . Stop IV Vanco.   Provider Location: [place], [place] Patient examined by: Robert [name](paramedic) Patient identified as Robert [name] and Date of Birth of 1928/01/12.  Hospital Course: CC: wound infection HPI: Robert [name] is a 89 y.o. male with medical history significant Patient is a 89 y.o.  male with history of HTN, HLD, A-fib on Coumadin , CAD s/p CABG, aortic stenosis-s/p aortic valve replacement, PPM implantation, chronic HFpEF who was recently hospitalized and then transition to hospital at home program-discharged on 9/6 for RLE ulcer with cellulitis-presented to the hospital with worsening right leg swelling/erythema and development of 2 additional ulcers.   Per history obtained-since this past Monday (9/15)-he developed 2 additional ulcers in his right leg-since then he has had some greenish appearing discharge from these ulcers.  His right leg has become more erythematous and swollen compared to the past few days.  He was seen by his primary care practitioner-superficial cultures were obtained-a referral to [name] was placed-he was subsequently restarted on an oral antibiotic.  Due to continued worsening of his right leg wound/erythema/swelling-he presented to the ED for evaluation-subsequently the  hospitalist service was asked to admit this patient for further evaluation and treatment  Significant Events: Admitted 11/20/2023 for right lower leg cellulitis   Admission Labs: Na 138, K 3.6, CO2 of 29, BUN 41, scr 1.13, glu 108 T. Prot 6.3, alb 3.3, AST 21, ALT 18, alk phos 33, T. Bili 1.0 WBC 15.7, HgB 8.6, plt 304 Lactic acid 0.6  Antibiotic Therapy: Anti-infectives (From admission, onward)    Start     Dose/Rate Route Frequency Ordered Stop   11/23/23 2000  vancomycin  (VANCOREADY) IVPB 750 mg/150 mL        750 mg 150 mL/hr over 60 Minutes Intravenous Every 24 hours 11/23/23 1628     11/21/23 2015  vancomycin  (VANCOREADY) IVPB 750 mg/150 mL  Status:  Discontinued       Placed in Followed by Linked Group   750 mg 150 mL/hr over 60 Minutes Intravenous Every 24 hours 11/20/23 1924 11/23/23 1434   11/21/23 1800  vancomycin  (VANCOREADY) IVPB 750 mg/150 mL  Status:  Discontinued        750 mg 150 mL/hr over 60 Minutes Intravenous Every 24 hours 11/20/23 1744 11/20/23 1923   11/20/23 2015  vancomycin  (VANCOREADY) IVPB 1250 mg/250 mL       Placed in Followed by Linked Group   1,250 mg 166.7 mL/hr over 90 Minutes Intravenous  Once 11/20/23 1924 11/20/23 2158   11/20/23 1600  vancomycin  (VANCOREADY) IVPB 1250 mg/250 mL  Status:  Discontinued        1,250 mg 166.7 mL/hr over 90 Minutes Intravenous  Once 11/20/23 1545 11/20/23 1923   11/20/23 1545  vancomycin  (VANCOCIN ) IVPB 1000 mg/200 mL premix  Status:  Discontinued        1,000 mg 200  mL/hr over 60 Minutes Intravenous  Once 11/20/23 1537 11/20/23 1545       Procedures:   Consultants: Orthopedics(Robert [name]) Vascular Surgery(Robert [name])    Assessment and Plan: * Cellulitis of right leg 11/25/2023 Continue vancomycin  IV to complete 7 day course on 11/27/23  11/26/23 Reviewed [facility] cultures from 11-19-2023. Growing pseudomonas. Sensitive to cipro, levaquin and meropenem . Resistant to ceftaz and zosyn. Discussed with pharmacy.  Still think his infection is most likely from gram positive organism but cannot ignore his outpatient culture data. Will change abx regimen over to IV meropenem  and po zyvox . Stop IV Vanco.  I think pt is going to have to delay discharge and keep him thru weekend to make sure wound is healing properly. best case scenario is discharge on Sun/Mon. will take off Unna boot tomorrow as planned to take pictures. and then remove again/reapply at discharge. then pt can f/u with orthopedics as scheduled on Tuesday, september 30.   11-02-2023     11-03-2023     11-04-2023     11-05-2023     11-06-2023          11-07-2023          11-24-2023  (Right posterior leg)      (Right anterior leg)  (Right lateral leg)          Wound cellulitis Prior to 11/26/2023 Of the RLE Continue Unna boot per orthopedic and vascular recommendation Per Dr. Lanis (vascular) who was consulted by [name] (orthopedic), recommends to continue conservative management given pt's age. Should the wound worsen or healing is stagnate, Dr. Lanis can see patient outpatient for consideration of vascular intervention Wound care consulted, images in Epic media. Wound care RN recommends medihoney. Given unna boot is recommended to stay on until Thursday, we will assess wound healing at that time. If unna boot needs to be removed, then we can proceed with medihoney. Discussed with RN.   Per vascular on 11/23/23: 'He does have a toe pressure of 70 on the right. Given the fact he is on coumadin  for afib and INR is 2.7, he is 89 y/o with hx of HF and PPM and has a palpable right DP pulse and toe pressure of 70, feel it is reasonable to see if the ulcers improve with compression wrap.'  11/26/23 Reviewed [facility] cultures from 11-19-2023. Growing pseudomonas. Sensitive to cipro, levaquin and meropenem . Resistant to ceftaz and zosyn. Discussed with pharmacy. Still think his infection is most likely from gram positive organism but cannot ignore his  outpatient culture data. Will change abx regimen over to IV meropenem  and po zyvox . Stop IV Vanco.  I think pt is going to have to delay discharge and keep him thru weekend to make sure wound is healing properly. best case scenario is discharge on Sun/Mon. will take off Unna boot tomorrow as planned to take pictures. and then remove again/reapply at discharge. then pt can f/u with orthopedics as scheduled on Tuesday, september 30.   Chronic combined systolic and diastolic CHF (congestive heart failure) (HCC) 11/26/23 stable. Not exacerbated. On lasix  80 mg bid. And Metolazone  2.5 mg every Monday  S/P CABG x 1 11/26/23 stable  Chronic anticoagulation 11/26/23 stable. Remains on coumadin . Don't want to use cipro/levaquin right now due to interactions with coumadin .  Permanent atrial fibrillation (HCC) - . Left atrial appendage is clipped. 11/25/2023 Home digoxin  0.125 mg qevening, metoprolol  50 mg daily resumed  11/26/23 stable. Continue digoxin .   DNR (do not resuscitate)/DNI(Do Not Intubate)  GERD (gastroesophageal reflux disease) 11/26/23 stable. On PPI  Pacemaker 11/26/23 stable  Aortic stenosis 11/26/23 stable  HLD (hyperlipidemia) 11/26/23 stable.  Essential hypertension 11/25/2023 Home metoprolol  succinate 50 mg daily  11/26/23 stable. Continue toprol -XL 50 mg daily.  DVT prophylaxis:  warfarin (COUMADIN ) tablet 4 mg     Code Status: Limited: Do not attempt resuscitation (DNR) -DNR-LIMITED -Do Not Intubate/DNI  Family Communication: discussed with pt's wife Jenkins during video call Disposition Plan: home Reason for continuing need for hospitalization: changing IV abx to meropenem , po zyvox .  Objective: Vitals:   11/25/23 2300 11/26/23 0158 11/26/23 0504 11/26/23 0900  BP:    123/71  Pulse: 69 65 71 70  Resp: 20 17 17 18   Temp:    98.1 F (36.7 C)  TempSrc:      SpO2: 94% 93% 95% 95%  Weight:      Height:       No intake or output data in the 24 hours  ending 11/26/23 1156 Filed Weights   11/20/23 1352 11/23/23 1755  Weight: 66.7 kg 66.8 kg   Examination: Note that physical examination performed by paramedic on-scene and documented by provider Video Exam performed by video enabled technology  Physical Exam Vitals and nursing note reviewed.  Constitutional:      Comments: Chronically ill appearing. Hard of hearing  Pulmonary:     Effort: Pulmonary effort is normal. No respiratory distress.  Abdominal:     General: Abdomen is flat.  Skin:    General: Skin is warm.     Comments: Right LE wrapped in Unna boot. Toes on right foot were not blue/purple. Good circulation.  No edema of left foot  Neurological:     Mental Status: He is oriented to person, place, and time.     Comments: Hard of hearing    Data Reviewed: I have personally reviewed following labs and imaging studies  CBC: Recent Labs  Lab 11/20/23 1356 11/20/23 1404 11/21/23 0749 11/22/23 0314 11/24/23 0500 11/25/23 1006 11/26/23 1017  WBC 15.7*  --  17.0* 13.3* 15.0* 14.5* 14.5*  NEUTROABS 7.2  --   --   --   --   --   --   HGB 8.6*   < > 8.7* 8.5* 9.3* 9.1* 9.4*  HCT 26.8*   < > 26.7* 25.9* 29.2* 28.3* 29.2*  MCV 97.8  --  97.4 97.0 98.6 96.3 96.7  PLT 304  --  335 290 367 372 352   < > = values in this interval not displayed.   Basic Metabolic Panel: Recent Labs  Lab 11/21/23 0749 11/22/23 0314 11/24/23 1355 11/25/23 1006 11/26/23 1017  NA 138 139 138 138 138  K 3.7 3.5 4.0 3.7 3.5  CL 96* 98 97* 98 95*  CO2 29 29 30 28 27   GLUCOSE 110* 125* 109* 102* 111*  BUN 35* 29* 25* 28* 31*  CREATININE 1.01 0.91 1.04 0.90 0.90  CALCIUM  8.9 8.6* 9.1 9.2 9.0   GFR: Estimated Creatinine Clearance: 42 mL/min (by C-G formula based on SCr of 0.9 mg/dL). Liver Function Tests: Recent Labs  Lab 11/20/23 1356  AST 21  ALT 18  ALKPHOS 33*  BILITOT 1.0  PROT 6.3*  ALBUMIN  3.3*   Coagulation Profile: Recent Labs  Lab 11/22/23 0314 11/23/23 0459  11/24/23 0500 11/25/23 1006 11/26/23 1017  INR 2.8* 2.7* 3.2* 2.6* 2.3*   BNP (last 3 results) Recent Labs    04/25/23 0533 08/20/23 1139  BNP 802.0* 781.0*  Sepsis Labs: Recent Labs  Lab 11/20/23 1412 11/20/23 1625  LATICACIDVEN 0.6 0.9   Scheduled Meds:  cholecalciferol   5,000 Units Oral Once per day on Monday Thursday   digoxin   0.125 mg Oral QPM   docusate sodium   100 mg Oral BID   furosemide   80 mg Oral BID   Influenza vac split trivalent PF  0.5 mL Intramuscular Tomorrow-1000   leptospermum manuka honey  1 Application Topical Daily   linezolid   600 mg Oral Q12H   metolazone   2.5 mg Oral Q Mon   metoprolol  succinate  50 mg Oral Daily   pantoprazole   40 mg Oral Daily   warfarin  4 mg Oral q1600   Warfarin - Pharmacist Dosing Inpatient   Does not apply q1600   Continuous Infusions:  meropenem  (MERREM ) IV       LOS: 6 days   Time spent: 55 minutes  Camellia Door, DO  [facility]  11/26/2023, 11:56 AM

## 2023-11-26 NOTE — Assessment & Plan Note (Addendum)
 11/26/23 stable. Remains on coumadin . Don't want to use cipro/levaquin right now due to interactions with coumadin .  11/27/23 Will hold coumadin  while he is on Levaquin(for 4 days) after discharge. He can go to get his INR checked on October 3 and restart coumadin . Discussed this with pt's dtr scarlett.  11/28/23 continue coumadin  today and tomorrow. Will hold starting Monday night(September 20) while he is taking levaquin. Dtr to call cardiologist office to see what they want to do about continuing coumadin . Holding coumadin  as not to risk supertherapeutic INR while taking Levaquin after discharge.   11/29/23 last dose of coumadin  tonight. Hold coumadin  starting tomorrow due to pt being on Levaquin.  Dtr to call cardiology office to see if coumadin  needs to be restarted after finishing levaquin. Pt with left atrial appendage clipped. Theoretically, pt does not need systemic anticoagulation for afib now.

## 2023-11-26 NOTE — Progress Notes (Signed)
 Patient appears comfortable in the chair. Pt reports intermittent right leg pain. Unna boot remains in place. Plan of care reviewed with patient and spouse. All needs addressed.

## 2023-11-26 NOTE — Plan of Care (Signed)

## 2023-11-27 DIAGNOSIS — L89151 Pressure ulcer of sacral region, stage 1: Secondary | ICD-10-CM

## 2023-11-27 LAB — PROTIME-INR
INR: 1.9 — ABNORMAL HIGH (ref 0.8–1.2)
Prothrombin Time: 23 s — ABNORMAL HIGH (ref 11.4–15.2)

## 2023-11-27 MED ORDER — WARFARIN SODIUM 5 MG PO TABS
5.0000 mg | ORAL_TABLET | Freq: Every day | ORAL | Status: DC
  Administered 2023-11-27: 5 mg via ORAL
  Filled 2023-11-27 (×2): qty 1

## 2023-11-27 MED ORDER — BACID PO TABS
2.0000 | ORAL_TABLET | Freq: Two times a day (BID) | ORAL | Status: DC
  Administered 2023-11-27: 2 via ORAL

## 2023-11-27 NOTE — Progress Notes (Signed)
 Medication phone call was completed with patient and his wife to administer probiotic. Current health data currently populating and has backfilled. There appeared to be a problem with the internet modem per patient's daughter [name]. Medic and EMT also made visit to patient's home to ensure that the armband was properly in place since adjustments by patient's wife were unsuccessful. Current health data up to date and within normal limits.

## 2023-11-27 NOTE — Progress Notes (Signed)
 Spoke with patient's wife at 0930 via phone. Informed her of timing for paramedic am visit. Offered to have patient take his am dose of lasix  now, but deferred til the paramedic visits. No complaints at this time.

## 2023-11-27 NOTE — Progress Notes (Addendum)
 Hospital [facility] Progress Note    Robert [name]  FMW:[id number] DOB: 12/26/1927 DOA: 11/20/2023 PCP: [name], [name]  E, PA  Subjective: Pt seen today for video call. Pt's dtr Robert [name] present during call. Discussed outpatiet wound cx and abx changes. Plan on IV meropenem  and po zyvox  thru Sunday. Discharge on Sunday evening. Likely on po levaquin and po zyvox. Dtr states they already have a Rx for zyvox at home that was written by PCP.  Discussed drug-drug interactions with levaquin and coumadin. Will hold coumadin while on levaquin for 4 days(Monday September 29 - October 2). Pt to have INR drawn in clinic on October 3 to restart coumadin.  Pt is doing well. No pain in his legs. Breathing is good. No diarrhea.   Provider Location: [place], [place] Patient examined by: Robert [name](paramedic) Patient identified as Robert [name] and Date of Birth of 07/25/1927.  Hospital Course: CC: wound infection HPI: Robert [name] is a 89 y.o. male with medical history significant Patient is a 89 y.o.  male with history of HTN, HLD, A-fib on Coumadin, CAD s/p CABG, aortic stenosis-s/p aortic valve replacement, PPM implantation, chronic HFpEF who was recently hospitalized and then transition to hospital at home program-discharged on 9/6 for RLE ulcer with cellulitis-presented to the hospital with worsening right leg swelling/erythema and development of 2 additional ulcers.   Per history obtained-since this past Monday (9/15)-he developed 2 additional ulcers in his right leg-since then he has had some greenish appearing discharge from these ulcers.  His right leg has become more erythematous and swollen compared to the past few days.  He was seen by his primary care practitioner-superficial cultures were obtained-a referral to Dr. Duda was placed-he was subsequently restarted on an oral antibiotic.  Due to continued worsening of his right leg wound/erythema/swelling-he presented to the ED for  evaluation-subsequently the hospitalist service was asked to admit this patient for further evaluation and treatment  Significant Events: Admitted 11/20/2023 for right lower leg cellulitis   Admission Labs: Na 138, K 3.6, CO2 of 29, BUN 41, scr 1.13, glu 108 T. Prot 6.3, alb 3.3, AST 21, ALT 18, alk phos 33, T. Bili 1.0 WBC 15.7, HgB 8.6, plt 304 Lactic acid 0.6  Antibiotic Therapy: Anti-infectives (From admission, onward)    Start     Dose/Rate Route Frequency Ordered Stop   11/23/23 2000  vancomycin (VANCOREADY) IVPB 750 mg/150 mL        750 mg 150 mL/hr over 60 Minutes Intravenous Every 24 hours 11/23/23 1628     11/21/23 2015  vancomycin (VANCOREADY) IVPB 750 mg/150 mL  Status:  Discontinued       Placed in Followed by Linked Group   750 mg 150 mL/hr over 60 Minutes Intravenous Every 24 hours 11/20/23 1924 11/23/23 1434   11/21/23 1800  vancomycin (VANCOREADY) IVPB 750 mg/150 mL  Status:  Discontinued        75 0 mg 150 mL/hr over 60 Minutes Intravenous Every 24 hours 11/20/23 1744 11/20/23 1923   11/20/23 2015  vancomycin  (VANCOREADY) IVPB 1250 mg/250 mL       Placed in Followed by Linked Group   1,250 mg 166.7 mL/hr over 90 Minutes Intravenous  Once 11/20/23 1924 11/20/23 2158   11/20/23 1600  vancomycin  (VANCOREADY) IVPB 1250 mg/250 mL  Status:  Discontinued        1,250 mg 166.7 mL/hr over 90 Minutes Intravenous  Once 11/20/23 1545 11/20/23 1923   11/20/23 1545  vancomycin  (VANCOCIN ) IVPB 1000 mg/200  mL premix  Status:  Discontinued        1,000 mg 200 mL/hr over 60 Minutes Intravenous  Once 11/20/23 1537 11/20/23 1545       Procedures:   Consultants: Orthopedics(Robert [name]) Vascular Surgery(Robert [name])    Assessment and Plan: * Cellulitis of right leg 11/25/2023 Continue vancomycin  IV to complete 7 day course on 11/27/23  11/26/23 Reviewed [facility] cultures from 11-19-2023. Growing pseudomonas. Sensitive to cipro, levaquin and meropenem . Resistant to ceftaz and  zosyn. Discussed with pharmacy. Still think his infection is most likely from gram positive organism but cannot ignore his outpatient culture data. Will change abx regimen over to IV meropenem  and po zyvox . Stop IV Vanco.  I think pt is going to have to delay discharge and keep him thru weekend to make sure wound is healing properly. best case scenario is discharge on Sun/Mon. will take off Unna boot tomorrow as planned to take pictures. and then remove again/reapply at discharge. then pt can f/u with orthopedics as scheduled on Tuesday, september 30.  11/27/23  Plan on IV meropenem  and po zyvox  thru Sunday. Discharge on Sunday evening. Likely on po levaquin and po zyvox. Dtr states they already have a Rx for zyvox at home that was written by PCP. Discussed drug-drug interactions with levaquin and coumadin. Will hold coumadin while on levaquin for 4 days(Monday September 29 - October 2). Pt to have INR drawn in clinic on October 3 to restart coumadin. Paramedics will update pictures this evening after unna boot removed and then reapplied.   11-02-2023     11-03-2023     11-04-2023     11-05-2023     11-06-2023          11-07-2023          09 -23-2025  (Right posterior leg)      (Right anterior leg)  (Right lateral leg)  11-26-2023  (Right posterior leg)  (Right anterior leg)  (Right lateral leg)     Wound cellulitis Prior to 11/26/2023 Of the RLE Continue Unna boot per orthopedic and vascular recommendation Per Dr. Lanis (vascular) who was consulted by [name] (orthopedic), recommends to continue conservative management given pt's age. Should the wound worsen or healing is stagnate, Dr. Lanis can see patient outpatient for consideration of vascular intervention Wound care consulted, images in Epic media. Wound care RN recommends medihoney. Given unna boot is recommended to stay on until Thursday, we will assess wound healing at that time. If unna boot needs to be removed, then we can proceed  with medihoney. Discussed with RN.   Per vascular on 11/23/23: 'He does have a toe pressure of 70 on the right. Given the fact he is on coumadin  for afib and INR is 2.7, he is 89 y/o with hx of HF and PPM and has a palpable right DP pulse and toe pressure of 70, feel it is reasonable to see if the ulcers improve with compression wrap.'  11/26/23 Reviewed [facility] cultures from 11-19-2023. Growing pseudomonas. Sensitive to cipro, levaquin and meropenem . Resistant to ceftaz and zosyn. Discussed with pharmacy. Still think his infection is most likely from gram positive organism but cannot ignore his outpatient culture data. Will change abx regimen over to IV meropenem  and po zyvox . Stop IV Vanco.  I think pt is going to have to delay discharge and keep him thru weekend to make sure wound is healing properly. best case scenario is discharge on Sun/Mon. will take off Unna boot tomorrow as planned to  take pictures. and then remove again/reapply at discharge. then pt can f/u with orthopedics as scheduled on Tuesday, september 30.   11/27/23  Plan on IV meropenem  and po zyvox  thru Sunday. Discharge on Sunday evening. Likely on po levaquin and po zyvox . Dtr states they already have a Rx for zyvox  at home that was written by PCP. Discussed drug-drug interactions with levaquin and coumadin . Will hold coumadin  while on levaquin for 4 days(Monday September 29 - October 2). Pt to have INR drawn in clinic on October 3 to restart coumadin . Paramedics will update pictures this evening after unna boot removed and then reapplied.  Chronic combined systolic and diastolic CHF (congestive heart failure) (HCC) 11/26/23 stable. Not exacerbated. On lasix  80 mg bid. And Metolazone  2.5 mg every Monday  11/27/23 stable  S/P CABG x 1 11/26/23 stable  11/27/23 stable  Chronic anticoagulation 11/26/23 stable. Remains on coumadin . Don't want to use cipro/levaquin right now due to interactions with coumadin .  11/27/23 Will hold  coumadin  while he is on Levaquin(for 4 days) after discharge. He can go to get his INR checked on October 3 and restart coumadin . Discussed this with pt's dtr Robert [name].   Permanent atrial fibrillation (HCC) - . Left atrial appendage is clipped. 11/25/2023 Home digoxin  0.125 mg qevening, metoprolol  50 mg daily resumed  11/26/23 stable. Continue digoxin .  11/27/23 stable. On digoxin  and coumadin .  Will hold coumadin  while he is on Levaquin(for 4 days) after discharge. He can go to get his INR checked on October 3 and restart coumadin . In theory, pt could stop systemic anticoagulation altogether. Dtr Robert [name] doesn't want to do this until pt f/u with his cardiologist Dr. Lorrane. I urged dtr to have pt f/u with cardiologist ASAP.   DNR (do not resuscitate)/DNI(Do Not Intubate)   GERD (gastroesophageal reflux disease) 11/26/23 stable. On PPI  11/27/23 stable  Pacemaker 11/26/23 stable  11/27/23 stable   Aortic stenosis 11/26/23 stable  11/27/23 stable  HLD (hyperlipidemia) 11/26/23 stable.  11/27/23 stable  Essential hypertension 11/25/2023 Home metoprolol  succinate 50 mg daily  11/26/23 stable. Continue toprol -XL 50 mg daily.  11/27/23 stable   Pressure injury of coccygeal region, stage 1  11-26-2023        DVT prophylaxis:  warfarin (COUMADIN ) tablet 5 mg     Code Status: Limited: Do not attempt resuscitation (DNR) -DNR-LIMITED -Do Not Intubate/DNI  Family Communication: discussed during video call with dtr Robert [name] Disposition Plan: home Reason for continuing need for hospitalization: remains on IV ABX  Objective: Vitals:   11/26/23 0504 11/26/23 0900 11/26/23 1736 11/27/23 1045  BP:  123/71 (!) 122/59 129/81  Pulse: 71 70 73 67  Resp: 17 18 11 14   Temp:  98.1 F (36.7 C) 98.5 F (36.9 C) 98.2 F (36.8 C)  TempSrc:  Oral Oral Oral  SpO2: 95% 95% 96% 96%  Weight:    64.4 kg  Height:    5' 4 (1.626 m)    Intake/Output Summary (Last 24 hours)  at 11/27/2023 1738 Last data filed at 11/26/2023 1747 Gross per 24 hour  Intake 100 ml  Output --  Net 100 ml   Filed Weights   11/20/23 1352 11/23/23 1755 11/27/23 1045  Weight: 66.7 kg 66.8 kg 64.4 kg    Examination: Note that physical examination performed by paramedic on-scene and documented by provider Video Exam performed by video enabled technology  Physical Exam Vitals and nursing note reviewed.  Constitutional:      General: He is not in acute  distress.    Appearance: He is not toxic-appearing.  Cardiovascular:     Rate and Rhythm: Normal rate and regular rhythm.  Pulmonary:     Effort: Pulmonary effort is normal.     Breath sounds: Normal breath sounds.  Abdominal:     General: Bowel sounds are normal.     Palpations: Abdomen is soft.  Skin:    General: Skin is warm and dry.     Capillary Refill: Capillary refill takes less than 2 seconds.     Comments: Right lower leg wrapped in unna boot  Neurological:     Mental Status: He is alert and oriented to person, place, and time.     Comments: Hard of hearing     Data Reviewed: I have personally reviewed following labs and imaging studies  CBC: Recent Labs  Lab 11/21/23 0749 11/22/23 0314 11/24/23 0500 11/25/23 1006 11/26/23 1017  WBC 17.0* 13.3* 15.0* 14.5* 14.5*  HGB 8.7* 8.5* 9.3* 9.1* 9.4*  HCT 26.7* 25.9* 29.2* 28.3* 29.2*  MCV 97.4 97.0 98.6 96.3 96.7  PLT 335 290 367 372 352   Basic Metabolic Panel: Recent Labs  Lab 11/21/23 0749 11/22/23 0314 11/24/23 1355 11/25/23 1006 11/26/23 1017  NA 138 139 138 138 138  K 3.7 3.5 4.0 3.7 3.5  CL 96* 98 97* 98 95*  CO2 29 29 30 28 27   GLUCOSE 110* 125* 109* 102* 111*  BUN 35* 29* 25* 28* 31*  CREATININE 1.01 0.91 1.04 0.90 0.90  CALCIUM  8.9 8.6* 9.1 9.2 9.0   GFR: Estimated Creatinine Clearance: 42 mL/min (by C-G formula based on SCr of 0.9 mg/dL). Liver Function Tests: No results for input(s): AST, ALT, ALKPHOS, BILITOT, PROT,  ALBUMIN  in the last 168 hours.  Coagulation Profile: Recent Labs  Lab 11/23/23 0459 11/24/23 0500 11/25/23 1006 11/26/23 1017 11/27/23 1053  INR 2.7* 3.2* 2.6* 2.3* 1.9*   BNP (last 3 results) Recent Labs    04/25/23 0533 08/20/23 1139  BNP 802.0* 781.0*   Sepsis Labs: No results for input(s): PROCALCITON, LATICACIDVEN in the last 168 hours.  Scheduled Meds:  cholecalciferol   5,000 Units Oral Once per day on Monday Thursday   digoxin   0.125 mg Oral QPM   docusate sodium   100 mg Oral BID   furosemide   80 mg Oral BID   Gerhardt's butt cream   Topical BID   Influenza vac split trivalent PF  0.5 mL Intramuscular Tomorrow-1000   lactobacillus acidophilus  2 tablet Oral BID   leptospermum manuka honey  1 Application Topical Daily   linezolid   600 mg Oral Q12H   metolazone   2.5 mg Oral Q Mon   metoprolol  succinate  50 mg Oral Daily   pantoprazole   40 mg Oral Daily   warfarin  5 mg Oral q1600   Warfarin - Pharmacist Dosing Inpatient   Does not apply q1600   Continuous Infusions:  meropenem  (MERREM ) IV Stopped (11/27/23 1114)     LOS: 7 days   Time spent: 55 minutes  Camellia Door, DO  [facility]  11/27/2023, 5:38 PM

## 2023-11-27 NOTE — TOC Initial Note (Signed)
 Transition of Care [facility]) - Initial/Assessment Note    Patient Details  Name: Robert [name] MRN: [id number] Date of Birth: [birth year]  [facility] CM/SW Contact:    Corean JAYSON Canary, RN Phone Number: 11/27/2023, 11:03 AM  Clinical Narrative:                 Gerre Bunker admitted back to hospital at home for cellulitis. Discussed in ID rounds. Special attention to wound care and una boots,  wound care direction identified, all on same page.  No DME currently needed, is active with [facility] for PT and RN, will continue after discharge.  IP care management will continue to follow for needs, recommendations, and transitions of care.      Barriers to Discharge: Continued Medical Work up   Patient Goals and CMS Choice            Expected Discharge Plan and Services   Discharge Planning Services: CM Consult Post Acute Care Choice: Home Health Living arrangements for the past 2 months: Single Family Home                                      Prior Living Arrangements/Services Living arrangements for the past 2 months: Single Family Home Lives with:: Spouse Patient language and need for interpreter reviewed:: Yes Do you feel safe going back to the place where you live?: Yes      Need for Family Participation in Patient Care: Yes (Comment) Care giver support system in place?: Yes (comment)   Criminal Activity/Legal Involvement Pertinent to Current Situation/Hospitalization: No - Comment as needed  Activities of Daily Living   ADL Screening (condition at time of admission) Independently performs ADLs?: Yes (appropriate for developmental age) Is the patient deaf or have difficulty hearing?: Yes Does the patient have difficulty seeing, even when wearing glasses/contacts?: No Does the patient have difficulty concentrating, remembering, or making decisions?: No  Permission Sought/Granted                  Emotional Assessment        Orientation: : Oriented to Self, Oriented to  Time, Oriented to Place Alcohol / Substance Use: Not Applicable Psych Involvement: No (comment)  Admission diagnosis:  Cellulitis [L03.90] Wound infection [T14.8XXA, L08.9] Cellulitis of right lower extremity [L03.115] Patient Active Problem List   Diagnosis Date Noted   Wound cellulitis 11/25/2023   DNR (do not resuscitate)/DNI(Do Not Intubate) 11/07/2023   Carcinoma of prostate (HCC) 11/02/2023   Cellulitis of right leg 11/02/2023   S/P CABG x 1 08/12/2023   Chronic combined systolic and diastolic CHF (congestive heart failure) (HCC) 08/12/2023   Subtherapeutic international normalized ratio (INR) 04/24/2023   Anemia of chronic disease 04/24/2023   Pacemaker 09/06/2020   Tachycardia-bradycardia syndrome (HCC) 05/03/2020   Secondary hypercoagulable state 04/04/2020   Mixed conductive and sensorineural hearing loss of both ears 09/14/2018   Encounter for therapeutic drug monitoring 06/02/2014   Chronic anticoagulation 05/17/2014   Atrial flutter (HCC) 06/08/2013   S/P AVR - tissue AVR 05/2013 05/12/2013   Aortic stenosis    Coronary atherosclerosis of native coronary artery 04/21/2013   Essential hypertension 04/21/2013   HLD (hyperlipidemia) 04/21/2013   Permanent atrial fibrillation (HCC) - . Left atrial appendage is clipped. 04/21/2013   GERD (gastroesophageal reflux disease) 04/21/2013   PCP:  [name], [name]  E, PA Pharmacy:   [facility] [phone] - [place],  [place] - [address] [place] [address] Phone: [phone] Fax: [phone]  [facility] - [place], [place] - [address] [place] [address] Phone: [phone] Fax: [phone]     Social Drivers of Health (SDOH) Social History: SDOH Screenings   Food Insecurity: No Food Insecurity (11/20/2023)  Housing: Low Risk  (11/20/2023)  Transportation Needs: No Transportation Needs (11/20/2023)   Utilities: Not At Risk (11/20/2023)  Social Connections: Moderately Isolated (11/20/2023)  Tobacco Use: Low Risk  (11/20/2023)   SDOH Interventions:     Readmission Risk Interventions     No data to display

## 2023-11-27 NOTE — Progress Notes (Signed)
 Called pt, his wife states she doesn't want to give him his probiotic right now, will wait for evening visit.

## 2023-11-27 NOTE — Plan of Care (Signed)
  Problem: Education: Goal: Knowledge of General Education information will improve Description: Including pain rating scale, medication(s)/side effects and non-pharmacologic comfort measures Outcome: Progressing   Problem: Health Behavior/Discharge Planning: Goal: Ability to manage health-related needs will improve Outcome: Progressing   Problem: Clinical Measurements: Goal: Ability to maintain clinical measurements within normal limits will improve Outcome: Progressing Goal: Diagnostic test results will improve Outcome: Progressing Goal: Respiratory complications will improve Outcome: Progressing Goal: Cardiovascular complication will be avoided Outcome: Progressing   Problem: Activity: Goal: Risk for activity intolerance will decrease Outcome: Progressing   Problem: Nutrition: Goal: Adequate nutrition will be maintained Outcome: Progressing   Problem: Coping: Goal: Level of anxiety will decrease Outcome: Progressing   Problem: Elimination: Goal: Will not experience complications related to bowel motility Outcome: Progressing   Problem: Pain Managment: Goal: General experience of comfort will improve and/or be controlled Outcome: Progressing   Problem: Clinical Measurements: Goal: Ability to avoid or minimize complications of infection will improve Outcome: Progressing   Problem: Skin Integrity: Goal: Skin integrity will improve Outcome: Progressing

## 2023-11-27 NOTE — Progress Notes (Signed)
 PHARMACY - ANTICOAGULATION CONSULT NOTE  Pharmacy Consult for Warfarin Indication: atrial fibrillation  Allergies  Allergen Reactions   Amoxicillin Swelling    penile swelling   Lisinopril Cough   Kenalog  [Triamcinolone ] Rash    Patient Measurements: Height: 5' 4 (162.6 cm) Weight: 66.8 kg (147 lb 3.2 oz) IBW/kg (Calculated) : 59.2 HEPARIN  DW (KG): 66.8  Vital Signs:    Labs: Recent Labs    11/25/23 1006 11/26/23 1017 11/27/23 1053  HGB 9.1* 9.4*  --   HCT 28.3* 29.2*  --   PLT 372 352  --   LABPROT 28.8* 26.0* 23.0*  INR 2.6* 2.3* 1.9*  CREATININE 0.90 0.90  --     Estimated Creatinine Clearance: 42 mL/min (by C-G formula based on SCr of 0.9 mg/dL).   Medical History: Past Medical History:  Diagnosis Date   Acute CHF (HCC) 05/06/2013   Acute respiratory failure with hypoxia (HCC) 04/24/2023   Aortic stenosis    a. s/p tissue AVR 05/2013;  b. Echo (06/2013):  Mod LVH, EF 60-65%, no RWMA, Gr 2 DD, AVR ok (mean 13 mmHg), MAC, mild MR, mod LAE, mild RAE, PASP 46 mmHg (mild pulmo HTN)   Atrial fibrillation with RVR (HCC) 05/06/2013   Blood loss anemia    a. 05/2009 a/w GIB.   CAD (coronary artery disease)    a. Cath 04/2009: BMS to LCx 04/2009; CTO of RCA with L-R collaterals, 40% ostial diag.   Carpal tunnel syndrome    bilateral, carpal tunnel release x 2   Esophageal ulcer    a. 05/2009 with hemorrhage - injected with epinephrine -Dr. Lamar Bunk.   Esophagitis    a. 05/2009 a/w GIB.   GERD (gastroesophageal reflux disease)    EGD, Dr. Quay 2008   GI bleed    a. 05/2009 as above: with associated anemia. Lower esophageal ulcer with extensive erosive esophagitis and large clot by EGD 05/2009.    Hx of colonoscopy 2008   Dr. Cindy, polyps- 5 yr f/u recommended   Hyperlipidemia    Hypertension    Influenza A 04/24/2023   PAF (paroxysmal atrial fibrillation) (HCC)    a. Post-op AVR;  b. 04/2014 recurrent.   Perforated ear drum    right ear  drum, tube in left ear drum, Dr. Jesus   Polymyalgia rheumatica    Prostate cancer [facility])    Vitamin D  deficiency     Assessment: GR is a 48 YOM with PMH significant for HTN, HLD, AF, CAD s/p CABG, aortic stenosis s/p AVR, PPM, and HFpEF. Patient presents with a Chief Complaint of wound infection for this Admission. Warfarin dosing per Pharmacy Consult placed for an indication of therapy as atrial fibrillation.   Goal of Therapy:  INR 2-3 Monitor platelets by anticoagulation protocol: Yes   Plan:  INR is sub-therapeutic. Increase dose to warfarin 5 mg PO daily. Continue monitoring daily INR lab.  [name], PharmD 11/27/2023,2:01 PM

## 2023-11-27 NOTE — Progress Notes (Signed)
 Upon arrival the pt was sitting in his living room. He was alert and oriented to person,place,time and event. His skin was warm,dry and normal in color. He had a strong radial pulse and was breathing normally. The pt denied any pain and stated he was having a food day. The pt and his wife were informed that his IV ABX would be changing and his discharge date would be as well.  Vitals were taken and are as noted.  Medications were given as noted.   The virtual nurse was called and she was able to speak to the pt, his wife and daughter.   Medications were given without incident and the pt's wife stated she believed he was starting to form a skin tear near his sacrum if same could be looked at.   The skin on the pt;s sacrum was not broken at this time but same did appear red in color. It was noted that the pt had the redness and irritation to his buttocks. A sacral mexiplex was placed on the pt and when he sat down he stated that it felt a lot better. Once the abx were done the pt was asked if anything else could be done for him and he denied same. He was informed that the night time nurse would be calling shortly but if they had any trouble to please call.

## 2023-11-27 NOTE — Progress Notes (Signed)
 Phone call completed with patient's wife. No data populating in current health at this time. Wife Jenkins states band is in place on arm and made some adjustments. Will wait for information to load.

## 2023-11-27 NOTE — Plan of Care (Signed)
  Problem: Education: Goal: Knowledge of General Education information will improve Description: Including pain rating scale, medication(s)/side effects and non-pharmacologic comfort measures Outcome: Progressing   Problem: Health Behavior/Discharge Planning: Goal: Ability to manage health-related needs will improve Outcome: Progressing   Problem: Clinical Measurements: Goal: Ability to maintain clinical measurements within normal limits will improve Outcome: Progressing Goal: Will remain free from infection Outcome: Progressing Goal: Diagnostic test results will improve Outcome: Progressing Goal: Respiratory complications will improve Outcome: Progressing Goal: Cardiovascular complication will be avoided Outcome: Progressing   Problem: Activity: Goal: Risk for activity intolerance will decrease Outcome: Progressing   Problem: Nutrition: Goal: Adequate nutrition will be maintained Outcome: Progressing   Problem: Coping: Goal: Level of anxiety will decrease Outcome: Progressing   Problem: Elimination: Goal: Will not experience complications related to bowel motility Outcome: Progressing Goal: Will not experience complications related to urinary retention Outcome: Progressing   Problem: Skin Integrity: Goal: Risk for impaired skin integrity will decrease Outcome: Progressing   Problem: Clinical Measurements: Goal: Ability to avoid or minimize complications of infection will improve Outcome: Progressing   Problem: Skin Integrity: Goal: Skin integrity will improve Outcome: Progressing   Problem: Clinical Measurements: Goal: Ability to avoid or minimize complications of infection will improve Outcome: Progressing

## 2023-11-27 NOTE — Progress Notes (Signed)
 Completed virtual rounds with MD,paramedic at patient bedside. POC reviewed and discussed ,patient voices understanding and agreement. Pt reminded to call RN for any needs, RN and MD available at all times. Pt voices understanding. Pt aware of next planned visit and next call from RN.

## 2023-11-27 NOTE — Assessment & Plan Note (Addendum)
  11-26-2023   11-27-2023    

## 2023-11-28 ENCOUNTER — Other Ambulatory Visit (HOSPITAL_COMMUNITY): Payer: Self-pay

## 2023-11-28 ENCOUNTER — Encounter (HOSPITAL_COMMUNITY): Payer: Self-pay | Admitting: Internal Medicine

## 2023-11-28 LAB — CBC WITH DIFFERENTIAL/PLATELET
Abs Immature Granulocytes: 0.1 K/uL — ABNORMAL HIGH (ref 0.00–0.07)
Basophils Absolute: 0.1 K/uL (ref 0.0–0.1)
Basophils Relative: 1 %
Eosinophils Absolute: 0.2 K/uL (ref 0.0–0.5)
Eosinophils Relative: 1 %
HCT: 28.5 % — ABNORMAL LOW (ref 39.0–52.0)
Hemoglobin: 9.1 g/dL — ABNORMAL LOW (ref 13.0–17.0)
Immature Granulocytes: 1 %
Lymphocytes Relative: 36 %
Lymphs Abs: 4.4 K/uL — ABNORMAL HIGH (ref 0.7–4.0)
MCH: 31.4 pg (ref 26.0–34.0)
MCHC: 31.9 g/dL (ref 30.0–36.0)
MCV: 98.3 fL (ref 80.0–100.0)
Monocytes Absolute: 1 K/uL (ref 0.1–1.0)
Monocytes Relative: 8 %
Neutro Abs: 6.7 K/uL (ref 1.7–7.7)
Neutrophils Relative %: 53 %
Platelets: 330 K/uL (ref 150–400)
RBC: 2.9 MIL/uL — ABNORMAL LOW (ref 4.22–5.81)
RDW: 16.8 % — ABNORMAL HIGH (ref 11.5–15.5)
WBC: 12.4 K/uL — ABNORMAL HIGH (ref 4.0–10.5)
nRBC: 0 % (ref 0.0–0.2)

## 2023-11-28 LAB — PROTIME-INR
INR: 2 — ABNORMAL HIGH (ref 0.8–1.2)
Prothrombin Time: 23.2 s — ABNORMAL HIGH (ref 11.4–15.2)

## 2023-11-28 MED ORDER — LEVOFLOXACIN 250 MG PO TABS
500.0000 mg | ORAL_TABLET | Freq: Every day | ORAL | 0 refills | Status: AC
  Filled 2023-11-28: qty 8, 4d supply, fill #0

## 2023-11-28 MED ORDER — GERHARDT'S BUTT CREAM
1.0000 | TOPICAL_CREAM | Freq: Two times a day (BID) | CUTANEOUS | 0 refills | Status: AC
Start: 2023-11-28 — End: 2023-12-28
  Filled 2023-11-28: qty 60, 30d supply, fill #0

## 2023-11-28 MED ORDER — CHLORHEXIDINE GLUCONATE 4 % EX SOLN
Freq: Every day | CUTANEOUS | Status: DC | PRN
Start: 2023-11-28 — End: 2023-11-29
  Filled 2023-11-28: qty 118
  Filled 2023-11-28: qty 15

## 2023-11-28 MED ORDER — LINEZOLID 600 MG PO TABS: 600.0000 mg | ORAL_TABLET | Freq: Two times a day (BID) | ORAL | Status: AC

## 2023-11-28 MED ORDER — WARFARIN SODIUM 4 MG PO TABS
4.0000 mg | ORAL_TABLET | Freq: Every day | ORAL | Status: DC
  Administered 2023-11-28: 4 mg via ORAL
  Filled 2023-11-28 (×2): qty 1

## 2023-11-28 NOTE — Progress Notes (Signed)
 Completed virtual rounds with MD,paramedic at patient bedside. POC reviewed and discussed ,patient voices understanding and agreement. Pt reminded to call RN for any needs, RN and MD available at all times. Pt voices understanding. Pt aware of next planned visit and next call from RN.

## 2023-11-28 NOTE — Progress Notes (Signed)
 1906--Video call: Unable to reach patient/caregiver (spouse).   1908--Inbound call received from patient's spouse, spouse requesting support with answering tablet. Troubleshooting completed.    1910--Video contact call completed-Patient identifiers review  patient A&O hard of hearing, able to answer questions. Patient sitting in recliner with legs elevated, caregiver/spouse at side.  Patient denies concerns with any adverse health events. Patient denies pain (score 0) or respiratory issues. Plan for overnight monitoring, pain management, repositioning, safety, reviewed. PM medications already given prior to RN shift. No other medications given at time video call with oversight during this video call. Per Patient /caregiver night calls will go to daughter as needed, no other questions or concerns at the time of video call. Patient /caregiver encouraged to call as needed and will continue to be monitored via current health monitoring.

## 2023-11-28 NOTE — Progress Notes (Signed)
 PHARMACY - ANTICOAGULATION CONSULT NOTE  Pharmacy Consult for Warfarin Indication: atrial fibrillation  GR is a 43 YOM with PMH significant for HTN, HLD, AF, CAD s/p CABG, aortic stenosis s/p AVR, PPM, and HFpEF. Patient presents with a Chief Complaint of wound infection for this Admission.   Allergies  Allergen Reactions   Amoxicillin Swelling    penile swelling   Lisinopril Cough   Kenalog  [Triamcinolone ] Rash    Patient Measurements: Height: 5' 4 (162.6 cm) Weight: 64.4 kg (142 lb) IBW/kg (Calculated) : 59.2 HEPARIN  DW (KG): 64.4  Vital Signs: BP: 109/52 (09/27 1109) Pulse Rate: 61 (09/27 1109)  Labs: Recent Labs    11/26/23 1017 11/27/23 1053 11/28/23 1159  HGB 9.4*  --   --   HCT 29.2*  --   --   PLT 352  --   --   LABPROT 26.0* 23.0* 23.2*  INR 2.3* 1.9* 2.0*  CREATININE 0.90  --   --     Estimated Creatinine Clearance: 42 mL/min (by C-G formula based on SCr of 0.9 mg/dL).   Medical History: Past Medical History:  Diagnosis Date   Acute CHF (HCC) 05/06/2013   Acute respiratory failure with hypoxia (HCC) 04/24/2023   Aortic stenosis    a. s/p tissue AVR 05/2013;  b. Echo (06/2013):  Mod LVH, EF 60-65%, no RWMA, Gr 2 DD, AVR ok (mean 13 mmHg), MAC, mild MR, mod LAE, mild RAE, PASP 46 mmHg (mild pulmo HTN)   Atrial fibrillation with RVR (HCC) 05/06/2013   Blood loss anemia    a. 05/2009 a/w GIB.   CAD (coronary artery disease)    a. Cath 04/2009: BMS to LCx 04/2009; CTO of RCA with L-R collaterals, 40% ostial diag.   Carpal tunnel syndrome    bilateral, carpal tunnel release x 2   Esophageal ulcer    a. 05/2009 with hemorrhage - injected with epinephrine -Dr. Lamar Bunk.   Esophagitis    a. 05/2009 a/w GIB.   GERD (gastroesophageal reflux disease)    EGD, Dr. Quay 2008   GI bleed    a. 05/2009 as above: with associated anemia. Lower esophageal ulcer with extensive erosive esophagitis and large clot by EGD 05/2009.    Hx of colonoscopy 2008    Dr. Cindy, polyps- 5 yr f/u recommended   Hyperlipidemia    Hypertension    Influenza A 04/24/2023   PAF (paroxysmal atrial fibrillation) (HCC)    a. Post-op AVR;  b. 04/2014 recurrent.   Perforated ear drum    right ear drum, tube in left ear drum, Dr. Jesus   Polymyalgia rheumatica    Prostate cancer [facility])    Vitamin D  deficiency     Assessment: INR is therapeutic.  Goal of Therapy:  INR 2-3 Monitor platelets by anticoagulation protocol: Yes   Plan:  Change warfarin dose to 4 mg PO daily. Continue monitoring INR lab.  [name], PharmD 11/28/2023,2:58 PM

## 2023-11-28 NOTE — Plan of Care (Signed)
  Problem: Education: Goal: Knowledge of General Education information will improve Description: Including pain rating scale, medication(s)/side effects and non-pharmacologic comfort measures Outcome: Progressing   Problem: Health Behavior/Discharge Planning: Goal: Ability to manage health-related needs will improve Outcome: Progressing   Problem: Clinical Measurements: Goal: Ability to maintain clinical measurements within normal limits will improve Outcome: Progressing Goal: Will remain free from infection Outcome: Progressing Goal: Diagnostic test results will improve Outcome: Progressing Goal: Respiratory complications will improve Outcome: Progressing Goal: Cardiovascular complication will be avoided Outcome: Progressing   Problem: Clinical Measurements: Goal: Diagnostic test results will improve Outcome: Progressing   Problem: Clinical Measurements: Goal: Respiratory complications will improve Outcome: Progressing   Problem: Clinical Measurements: Goal: Cardiovascular complication will be avoided Outcome: Progressing   Problem: Activity: Goal: Risk for activity intolerance will decrease Outcome: Progressing   Problem: Nutrition: Goal: Adequate nutrition will be maintained Outcome: Progressing   Problem: Safety: Goal: Ability to remain free from injury will improve Outcome: Progressing   Problem: Skin Integrity: Goal: Risk for impaired skin integrity will decrease Outcome: Progressing

## 2023-11-28 NOTE — Progress Notes (Signed)
 Upon arrival the pt was found sitting in his living room. The pt was alert and oriented to person,place,time and event. His skin was warm,dry and normal in color. He had a strong radial pulse and was breathing normally. Vitals were taken and are as noted. The pt's wound were clean and dressings were changed. Pictured were taken of same. The virtual nurse was called for the visit and medications were administered. Once the pt's ABX was done the pt and his wife were asked if there was anything else that could be done for them and they denied same. They were informed to call back if anything were to change.

## 2023-11-28 NOTE — Progress Notes (Signed)
 Hospital at Home Progress Note    GEE HABIG  FMW:[id number] DOB: 08-07-1927 DOA: 11/20/2023 PCP: [name] Needs  E, PA  Subjective: Robert [name] seen today for video call. Dtr scarlett present during call. Paramedic performed physical exam. Documented in this note for clarity  Robert [name] had some shooting pain in right leg with unna boot for a few hours last night. Took tylenol . Pain went away. No fevers.  Discussed with dtr plan on changing to po levaquin after discharge and holding coumadin . She states she will call Robert [name]'s cardiologist as they were already thinking about stopping coumadin  treatment since Robert [name] has already had left atrial appendage clipping when Robert [name] had CABG.   Provider Location: [place], [place] Patient examined by: vickie(paramedic) Patient identified as Robert [name] and Date of Birth of March 05, 1927.  Hospital Course: CC: wound infection HPI: Robert [name] is a 89 y.o. male with medical history significant Patient is a 89 y.o.  male with history of HTN, HLD, A-fib on Coumadin , CAD s/p CABG, aortic stenosis-s/p aortic valve replacement, PPM implantation, chronic HFpEF who was recently hospitalized and then transition to hospital at home program-discharged on 9/6 for RLE ulcer with cellulitis-presented to the hospital with worsening right leg swelling/erythema and development of 2 additional ulcers.   Per history obtained-since this past Monday (9/15)-Robert [name] developed 2 additional ulcers in his right leg-since then Robert [name] has had some greenish appearing discharge from these ulcers.  His right leg has become more erythematous and swollen compared to the past few days.  Robert [name] was seen by his primary care practitioner-superficial cultures were obtained-a referral to [name] was placed-Robert [name] was subsequently restarted on an oral antibiotic.  Due to continued worsening of his right leg wound/erythema/swelling-Robert [name] presented to the ED for evaluation-subsequently the hospitalist service was asked to  admit this patient for further evaluation and treatment  Significant Events: Admitted 11/20/2023 for right lower leg cellulitis   Admission Labs: Na 138, K 3.6, CO2 of 29, BUN 41, scr 1.13, glu 108 T. Prot 6.3, alb 3.3, AST 21, ALT 18, alk phos 33, T. Bili 1.0 WBC 15.7, HgB 8.6, plt 304 Lactic acid 0.6  Antibiotic Therapy: Anti-infectives (From admission, onward)    Start     Dose/Rate Route Frequency Ordered Stop   11/23/23 2000  vancomycin  (VANCOREADY) IVPB 750 mg/150 mL        750 mg 150 mL/hr over 60 Minutes Intravenous Every 24 hours 11/23/23 1628     11/21/23 2015  vancomycin  (VANCOREADY) IVPB 750 mg/150 mL  Status:  Discontinued       Placed in Followed by Linked Group   750 mg 150 mL/hr over 60 Minutes Intravenous Every 24 hours 11/20/23 1924 11/23/23 1434   11/21/23 1800  vancomycin  (VANCOREADY) IVPB 750 mg/150 mL  Status:  Discontinued        750 mg 150 mL/hr over 60 Minutes Intravenous Every 24 hours 11/20/23 1744 11/20/23 1923   11/20/23 2015  vancomycin  (VANCOREADY) IVPB 1250 mg/250 mL       Placed in Followed by Linked Group   1,250 mg 166.7 mL/hr over 90 Minutes Intravenous  Once 11/20/23 1924 11/20/23 2158   11/20/23 1600  vancomycin  (VANCOREADY) IVPB 1250 mg/250 mL  Status:  Discontinued        1,250 mg 166.7 mL/hr over 90 Minutes Intravenous  Once 11/20/23 1545 11/20/23 1923   11/20/23 1545  vancomycin  (VANCOCIN ) IVPB 1000 mg/200 mL premix  Status:  Discontinued        1,000 mg  200 mL/hr over 60 Minutes Intravenous  Once 11/20/23 1537 11/20/23 1545       Procedures:   Consultants: Orthopedics(Duda) Vascular Surgery(Robins)    Assessment and Plan: * Cellulitis of right leg 11/25/2023 Continue vancomycin  IV to complete 7 day course on 11/27/23  11/26/23 Reviewed [facility] cultures from 11-19-2023. Growing pseudomonas. Sensitive to cipro, levaquin and meropenem . Resistant to ceftaz and zosyn. Discussed with pharmacy. Still think his infection is most  likely from gram positive organism but cannot ignore his outpatient culture data. Will change abx regimen over to IV meropenem  and po zyvox . Stop IV Vanco.  I think Robert [name] is going to have to delay discharge and keep him thru weekend to make sure wound is healing properly. best case scenario is discharge on Sun/Mon. will take off Unna boot tomorrow as planned to take pictures. and then remove again/reapply at discharge. then Robert [name] can f/u with orthopedics as scheduled on Tuesday, september 30.  11/27/23  Plan on IV meropenem  and po zyvox  thru "Sunday. Discharge on Sunday evening. Likely on po levaquin and po zyvox. Dtr states they already have a Rx for zyvox at home that was written by PCP. Discussed drug-drug interactions with levaquin and coumadin. Will hold coumadin while on levaquin for 4 days(Monday September 29 - October 2). Robert [name] to have INR drawn in clinic on October 3 to restart coumadin. Paramedics will update pictures this evening after unna boot removed and then reapplied.  11/28/23 plan to continue IV meropenem today with last dose tomorrow afternoon. Start levaquin on Monday Sept 29 and continue for a total of 4 days. That will give him a total of 7 days of abx coverage for pseudomonas. Robert [name] will f/u with Dr. Duda's clinic on Tuesday, September 30. Unna boot will be removed tomorrow so repeat pictures can be taken.  Paramedics will wash wound tomorrow with hibiclens soap prior to rebandage and reapplication of unna boot.  11-02-2023     11-03-2023     11-04-2023     11-05-2023     11-06-2023          11-07-2023          09" -23-2025  (Right posterior leg)      (Right anterior leg)  (Right lateral leg)  11-26-2023  (Right posterior leg)  (Right anterior leg)  (Right lateral leg)     Wound cellulitis Prior to 11/26/2023 Of the RLE Continue Unna boot per orthopedic and vascular recommendation Per Dr. Lanis (vascular) who was consulted by [name] (orthopedic), recommends to continue conservative  management given Robert [name]'s age. Should the wound worsen or healing is stagnate, Dr. Lanis can see patient outpatient for consideration of vascular intervention Wound care consulted, images in Epic media. Wound care RN recommends medihoney. Given unna boot is recommended to stay on until Thursday, we will assess wound healing at that time. If unna boot needs to be removed, then we can proceed with medihoney. Discussed with RN.   Per vascular on 11/23/23: 'Robert [name] does have a toe pressure of 70 on the right. Given the fact Robert [name] is on coumadin  for afib and INR is 2.7, Robert [name] is 89 y/o with hx of HF and PPM and has a palpable right DP pulse and toe pressure of 70, feel it is reasonable to see if the ulcers improve with compression wrap.'  11/26/23 Reviewed [facility] cultures from 11-19-2023. Growing pseudomonas. Sensitive to cipro, levaquin and meropenem . Resistant to ceftaz and zosyn. Discussed with pharmacy. Still think his infection is most  likely from gram positive organism but cannot ignore his outpatient culture data. Will change abx regimen over to IV meropenem  and po zyvox . Stop IV Vanco.  I think Robert [name] is going to have to delay discharge and keep him thru weekend to make sure wound is healing properly. best case scenario is discharge on Sun/Mon. will take off Unna boot tomorrow as planned to take pictures. and then remove again/reapply at discharge. then Robert [name] can f/u with orthopedics as scheduled on Tuesday, september 30.   11/27/23  Plan on IV meropenem  and po zyvox  thru Sunday. Discharge on Sunday evening. Likely on po levaquin and po zyvox . Dtr states they already have a Rx for zyvox  at home that was written by PCP. Discussed drug-drug interactions with levaquin and coumadin . Will hold coumadin  while on levaquin for 4 days(Monday September 29 - October 2). Robert [name] to have INR drawn in clinic on October 3 to restart coumadin . Paramedics will update pictures this evening after unna boot removed and then reapplied.  11/28/23 plan  to continue IV meropenem  today with last dose tomorrow afternoon. Start levaquin on Monday Sept 29 and continue for a total of 4 days. That will give him a total of 7 days of abx coverage for pseudomonas. Robert [name] will f/u with Dr. Crist clinic on Tuesday, September 30. Unna boot will be removed tomorrow so repeat pictures can be taken.  Paramedics will wash wound tomorrow with hibiclens  soap prior to rebandage and reapplication of unna boot.   Chronic combined systolic and diastolic CHF (congestive heart failure) (HCC) 11/26/23 stable. Not exacerbated. On lasix  80 mg bid. And Metolazone  2.5 mg every Monday  11/27/23 stable  11/28/23 stable   S/P CABG x 1 11/26/23 stable  11/27/23 stable  11/28/23 stable   Chronic anticoagulation 11/26/23 stable. Remains on coumadin . Don't want to use cipro/levaquin right now due to interactions with coumadin .  11/27/23 Will hold coumadin  while Robert [name] is on Levaquin(for 4 days) after discharge. Robert [name] can go to get his INR checked on October 3 and restart coumadin . Discussed this with Robert [name]'s dtr scarlett.  11/28/23 continue coumadin  today and tomorrow. Will hold starting Monday night(September 20) while Robert [name] is taking levaquin. Dtr to call cardiologist office to see what they want to do about continuing coumadin . Holding coumadin  as not to risk supertherapeutic INR while taking Levaquin after discharge.    Permanent atrial fibrillation (HCC) - . Left atrial appendage is clipped. 11/25/2023 Home digoxin  0.125 mg qevening, metoprolol  50 mg daily resumed  11/26/23 stable. Continue digoxin .  11/27/23 stable. On digoxin  and coumadin .  Will hold coumadin  while Robert [name] is on Levaquin(for 4 days) after discharge. Robert [name] can go to get his INR checked on October 3 and restart coumadin . In theory, Robert [name] could stop systemic anticoagulation altogether. Dtr scarlett doesn't want to do this until Robert [name] f/u with his cardiologist Dr. Lorrane. I urged dtr to have Robert [name] f/u with cardiologist  ASAP.  11/28/23 continue coumadin  today and tomorrow. Will hold starting Monday night(September 20) while Robert [name] is taking levaquin. Dtr to call cardiologist office to see what they want to do about continuing coumadin . Holding coumadin  as not to risk supertherapeutic INR while taking Levaquin after discharge.   DNR (do not resuscitate)/DNI(Do Not Intubate)   GERD (gastroesophageal reflux disease) 11/26/23 stable. On PPI  11/27/23 stable  11/28/23 stable  Pacemaker 11/26/23 stable  11/27/23 stable  11/28/23 stable   Aortic stenosis 11/26/23 stable  11/27/23 stable  11/28/23 stable  HLD (hyperlipidemia) 11/26/23 stable.  11/27/23 stable  11/28/23 stable  Essential hypertension 11/25/2023 Home metoprolol  succinate 50 mg daily  11/26/23 stable. Continue toprol -XL 50 mg daily.  11/27/23 stable  11/28/23 stable   Pressure injury of coccygeal region, stage 1  11-26-2023   11-27-2023     DVT prophylaxis:  warfarin (COUMADIN ) tablet 5 mg     Code Status: Limited: Do not attempt resuscitation (DNR) -DNR-LIMITED -Do Not Intubate/DNI  Family Communication: discussed with dtr scarlett during video call Disposition Plan: home Reason for continuing need for hospitalization: remains on IV ABX.  Objective: Vitals:   11/26/23 1736 11/27/23 1045 11/27/23 2157 11/28/23 1109  BP: (!) 122/59 129/81  (!) 109/52  Pulse: 73 67 67 61  Resp: 11 14    Temp: 98.5 F (36.9 C) 98.2 F (36.8 C)    TempSrc: Oral Oral    SpO2: 96% 96%    Weight:  64.4 kg    Height:  5' 4 (1.626 m)     No intake or output data in the 24 hours ending 11/28/23 1449 Filed Weights   11/20/23 1352 11/23/23 1755 11/27/23 1045  Weight: 66.7 kg 66.8 kg 64.4 kg    Examination: Note that physical examination performed by paramedic on-scene and documented by provider Video Exam performed by video enabled technology  Physical Exam Vitals and nursing note reviewed.  Constitutional:      General:  Robert [name] is not in acute distress.    Appearance: Robert [name] is not toxic-appearing.  HENT:     Head: Normocephalic and atraumatic.  Cardiovascular:     Rate and Rhythm: Normal rate.  Pulmonary:     Effort: Pulmonary effort is normal.  Abdominal:     General: Abdomen is flat. There is no distension.  Musculoskeletal:     Comments: Right lower leg/foot wrapped in unna boot  Skin:    General: Skin is warm and dry.  Neurological:     Mental Status: Robert [name] is alert and oriented to person, place, and time.     Comments: Hard of hearing     Data Reviewed: I have personally reviewed following labs and imaging studies  CBC: Recent Labs  Lab 11/22/23 0314 11/24/23 0500 11/25/23 1006 11/26/23 1017  WBC 13.3* 15.0* 14.5* 14.5*  HGB 8.5* 9.3* 9.1* 9.4*  HCT 25.9* 29.2* 28.3* 29.2*  MCV 97.0 98.6 96.3 96.7  PLT 290 367 372 352   Basic Metabolic Panel: Recent Labs  Lab 11/22/23 0314 11/24/23 1355 11/25/23 1006 11/26/23 1017  NA 139 138 138 138  K 3.5 4.0 3.7 3.5  CL 98 97* 98 95*  CO2 29 30 28 27   GLUCOSE 125* 109* 102* 111*  BUN 29* 25* 28* 31*  CREATININE 0.91 1.04 0.90 0.90  CALCIUM  8.6* 9.1 9.2 9.0   GFR: Estimated Creatinine Clearance: 42 mL/min (by C-G formula based on SCr of 0.9 mg/dL). Coagulation Profile: Recent Labs  Lab 11/24/23 0500 11/25/23 1006 11/26/23 1017 11/27/23 1053 11/28/23 1159  INR 3.2* 2.6* 2.3* 1.9* 2.0*   BNP (last 3 results) Recent Labs    04/25/23 0533 08/20/23 1139  BNP 802.0* 781.0*   Scheduled Meds:  cholecalciferol   5,000 Units Oral Once per day on Monday Thursday   digoxin   0.125 mg Oral QPM   docusate sodium   100 mg Oral BID   furosemide   80 mg Oral BID   Gerhardt's butt cream   Topical BID   Influenza vac split trivalent PF  0.5 mL Intramuscular Tomorrow-1000   lactobacillus acidophilus  2 tablet Oral  BID   leptospermum manuka honey  1 Application Topical Daily   linezolid   600 mg Oral Q12H   metolazone   2.5 mg Oral Q Mon    metoprolol  succinate  50 mg Oral Daily   pantoprazole   40 mg Oral Daily   warfarin  5 mg Oral q1600   Warfarin - Pharmacist Dosing Inpatient   Does not apply q1600   Continuous Infusions:  meropenem  (MERREM ) IV Stopped (11/28/23 1132)     LOS: 8 days   Time spent: 55 minutes  Camellia Door, DO  [facility]  11/28/2023, 2:49 PM

## 2023-11-28 NOTE — Plan of Care (Signed)
  Problem: Education: Goal: Knowledge of General Education information will improve Description: Including pain rating scale, medication(s)/side effects and non-pharmacologic comfort measures Outcome: Progressing   Problem: Clinical Measurements: Goal: Ability to maintain clinical measurements within normal limits will improve Outcome: Progressing   Problem: Clinical Measurements: Goal: Will remain free from infection Outcome: Progressing   Problem: Activity: Goal: Risk for activity intolerance will decrease Outcome: Progressing   Problem: Nutrition: Goal: Adequate nutrition will be maintained Outcome: Progressing   Problem: Elimination: Goal: Will not experience complications related to bowel motility Outcome: Progressing Goal: Will not experience complications related to urinary retention Outcome: Progressing   Problem: Pain Managment: Goal: General experience of comfort will improve and/or be controlled Outcome: Progressing   Problem: Safety: Goal: Ability to remain free from injury will improve Outcome: Progressing

## 2023-11-28 NOTE — Progress Notes (Signed)
 Arrived to find the Robert [name] sitting in his recliner with family at his side. Robert [name] is alert and oriented to his baseline. Robert [name] stated that he had been c/o pain in the middle of the night that he stated were shooting up his leg. Robert [name] denied any current pain this morning.   Physical exam showed negative DCAPBTLS to the head, neck, or face. PERRLA. Negative JVD or tracheal deviation. Chest expansion is equal with non-labored breathing. ABD is soft, non-tender. Robert [name] had a BM this morning and stated that he had some loose stool.   All medications were administered as prescribed. Robert [name] did not want to take the Colace today as he was not having any issues with constipation and didn't want to have diarrhea. Lab work was drawn by [employer].   Robert [name] denied having any other questions and was reminded that if they needed anything to call the RN.

## 2023-11-28 NOTE — TOC Transition Note (Signed)
 Transition of Care [facility]) - Discharge Note   Patient Details  Name: Robert [name] MRN: [id number] Date of Birth: 1927-05-26  Transition [facility] CM/SW Contact:  Corean JAYSON Canary, RN Phone Number: 11/28/2023, 4:05 PM   Clinical Narrative:     Patient transitioning out of Hospital at home and discharging tomorrow after morning IV abx dose. HH set up [facility] Ee from [facility]  aware of likely DC tomorrow.  Patient has a F/U appt with Dr Harden on Tuesday.     Final next level of care: Home w Home Health Services Barriers to Discharge: No Barriers Identified   Patient Goals and CMS Choice            Discharge Placement                       Discharge Plan and Services Additional resources added to the After Visit Summary for     Discharge Planning Services: CM Consult Post Acute Care Choice: Home Health                               Social Drivers of Health (SDOH) Interventions SDOH Screenings   Food Insecurity: No Food Insecurity (11/20/2023)  Housing: Low Risk  (11/20/2023)  Transportation Needs: No Transportation Needs (11/20/2023)  Utilities: Not At Risk (11/20/2023)  Social Connections: Moderately Isolated (11/20/2023)  Tobacco Use: Low Risk  (11/28/2023)     Readmission Risk Interventions     No data to display

## 2023-11-29 DIAGNOSIS — S41111A Laceration without foreign body of right upper arm, initial encounter: Secondary | ICD-10-CM

## 2023-11-29 LAB — PSA, TOTAL AND FREE
PSA, Free Pct: 33.3 %
PSA, Free: 0.1 ng/mL
Prostate Specific Ag, Serum: 0.3 ng/mL (ref 0.0–4.0)

## 2023-11-29 NOTE — Progress Notes (Addendum)
 9165 outgoing call to daughter r/t current health hypoxia alarm. [name] reports patient up and to the bathroom repositioned wearable O2 sats back up at 94%. Per notes not a new occurrence. Patient reports feeling fine denies SOB/chest pain/palpitations.. Informed family medic on the way there for D/C visit in about 35-40 min.

## 2023-11-29 NOTE — Progress Notes (Signed)
 1120 Medic at patients home for D/C virtual visit with provider and virtual RN daughter & spouse present. VS all within expected parameters. AVS completely reviewed and time allowed for all questions to be answered with teachback.Levaquin education completed. Labs reviewed with provider discussed warfarin continued use to be discussed with cardiology. PIV removed. All wounds irrigated and redressed and photos uploaded. Unna boot replaced. [facility] for home health visit for continued wound care. Patient and family expressed thanks for having again in the program another positive experience.

## 2023-11-29 NOTE — Plan of Care (Signed)
  Problem: Education: Goal: Knowledge of General Education information will improve Description: Including pain rating scale, medication(s)/side effects and non-pharmacologic comfort measures Outcome: Adequate for Discharge   Problem: Clinical Measurements: Goal: Ability to maintain clinical measurements within normal limits will improve Outcome: Adequate for Discharge   Problem: Activity: Goal: Risk for activity intolerance will decrease Outcome: Adequate for Discharge   Problem: Safety: Goal: Ability to remain free from injury will improve Outcome: Adequate for Discharge   Problem: Skin Integrity: Goal: Risk for impaired skin integrity will decrease Outcome: Adequate for Discharge   Problem: Skin Integrity: Goal: Skin integrity will improve Outcome: Adequate for Discharge

## 2023-11-29 NOTE — Discharge Summary (Signed)
 [facility] Phys[facility] Discharge Summary   Patient name: Robert [name]  Admit date:     11/20/2023  Discharge date: 11/29/2023  Attending Physician: RAENELLE DONALDA HERO [[id number]]  Discharge Physician: Camellia Door   PCP: [name], [name]  E, PA  Admitted From: Home  Disposition:  Home  Recommendations for Outpatient Follow-up:  Follow up with PCP in 1-2 weeks Follow up with orthopedics office([name]) on Tuesday, December 01, 2023  Home Health:resume home health PT/OT Equipment/Devices: None  Discharge Condition:Stable CODE STATUS:FULL Diet recommendation: Heart Healthy Fluid Restriction: 50 ounces per day  Hospital Summary: CC: wound infection HPI: Robert [name] is a 89 y.o. male with medical history significant Patient is a 89 y.o.  male with history of HTN, HLD, A-fib on Coumadin , CAD s/p CABG, aortic stenosis-s/p aortic valve replacement, PPM implantation, chronic HFpEF who was recently hospitalized and then transition to hospital at home program-discharged on 9/6 for RLE ulcer with cellulitis-presented to the hospital with worsening right leg swelling/erythema and development of 2 additional ulcers.   Per history obtained-since this past Monday (9/15)-he developed 2 additional ulcers in his right leg-since then he has had some greenish appearing discharge from these ulcers.  His right leg has become more erythematous and swollen compared to the past few days.  He was seen by his primary care practitioner-superficial cultures were obtained-a referral to [name] was placed-he was subsequently restarted on an oral antibiotic.  Due to continued worsening of his right leg wound/erythema/swelling-he presented to the ED for evaluation-subsequently the hospitalist service was asked to admit this patient for further evaluation and treatment  Significant Events: Admitted 11/20/2023 for right lower leg cellulitis 11-23-2023 transferred to [facility]  program 11-24-2023 continued on IV vancomycin  11-26-2023 outpt wound cx grew pseudomonas. IV abx changed to meropenem  11-29-2023 pt completes last dose of IV meropenem (4 days of therapy). Pt discharged from [facility] program. Pt will start 4 days of Levaquin therapy on 11-30-2023.  Admission Labs: Na 138, K 3.6, CO2 of 29, BUN 41, scr 1.13, glu 108 T. Prot 6.3, alb 3.3, AST 21, ALT 18, alk phos 33, T. Bili 1.0 WBC 15.7, HgB 8.6, plt 304 Lactic acid 0.6  Antibiotic Therapy: Anti-infectives (From admission, onward)    Start     Dose/Rate Route Frequency Ordered Stop   11/23/23 2000  vancomycin  (VANCOREADY) IVPB 750 mg/150 mL        750 mg 150 mL/hr over 60 Minutes Intravenous Every 24 hours 11/23/23 1628     11/21/23 2015  vancomycin  (VANCOREADY) IVPB 750 mg/150 mL  Status:  Discontinued       Placed in Followed by Linked Group   750 mg 150 mL/hr over 60 Minutes Intravenous Every 24 hours 11/20/23 1924 11/23/23 1434   11/21/23 1800  vancomycin  (VANCOREADY) IVPB 750 mg/150 mL  Status:  Discontinued        750 mg 150 mL/hr over 60 Minutes Intravenous Every 24 hours 11/20/23 1744 11/20/23 1923   11/20/23 2015  vancomycin  (VANCOREADY) IVPB 1250 mg/250 mL       Placed in Followed by Linked Group   1,250 mg 166.7 mL/hr over 90 Minutes Intravenous  Once 11/20/23 1924 11/20/23 2158   11/20/23 1600  vancomycin  (VANCOREADY) IVPB 1250 mg/250 mL  Status:  Discontinued        1,250 mg 166.7 mL/hr over 90 Minutes Intravenous  Once 11/20/23 1545 11/20/23 1923   11/20/23 1545  vancomycin  (VANCOCIN ) IVPB 1000 mg/200 mL premix  Status:  Discontinued        1,000 mg 200 mL/hr over 60 Minutes Intravenous  Once 11/20/23 1537 11/20/23 1545       Procedures:   Consultants: Orthopedics(Duda) Vascular Surgery(Robins)   Hospital Course by Problem: * Cellulitis of right leg 11/25/2023 Continue vancomycin  IV to complete 7 day course on 11/27/23  11/26/23 Reviewed [facility] cultures from  11-19-2023. Growing pseudomonas. Sensitive to cipro, levaquin and meropenem . Resistant to ceftaz and zosyn. Discussed with pharmacy. Still think his infection is most likely from gram positive organism but cannot ignore his outpatient culture data. Will change abx regimen over to IV meropenem  and po zyvox . Stop IV Vanco.  I think pt is going to have to delay discharge and keep him thru weekend to make sure wound is healing properly. best case scenario is discharge on Sun/Mon. will take off Unna boot tomorrow as planned to take pictures. and then remove again/reapply at discharge. then pt can f/u with orthopedics as scheduled on Tuesday, september 30.  11/27/23  Plan on IV meropenem  and po zyvox  thru "Sunday. Discharge on Sunday evening. Likely on po levaquin and po zyvox. Dtr states they already have a Rx for zyvox at home that was written by PCP. Discussed drug-drug interactions with levaquin and coumadin. Will hold coumadin while on levaquin for 4 days(Monday September 29 - October 2). Pt to have INR drawn in clinic on October 3 to restart coumadin. Paramedics will update pictures this evening after unna boot removed and then reapplied.  11/28/23 plan to continue IV meropenem today with last dose tomorrow afternoon. Start levaquin on Monday Sept 29 and continue for a total of 4 days. That will give him a total of 7 days of abx coverage for pseudomonas. He will f/u with Dr. Duda's clinic on Tuesday, September 30. Unna boot will be removed tomorrow so repeat pictures can be taken.  Paramedics will wash wound tomorrow with hibiclens soap prior to rebandage and reapplication of unna boot.  11/29/23 last day of IV meropenem(Day #4). Pt to start levaquin tomorrow. Levaquin 500 mg qday for 4 days. Tonight is last dose of coumadin. Hold coumadin starting tomorrow. Dtr to call cardiology office to see if coumadin needs to be restarted after finishing levaquin. Pt to f/u with Dr. Duda's office on Tuesday December 01, 2023 for wound evaluate. Unna boot removed today for wound cleaning and inspection. Unna boot will be replaced after wound cleaning. New pictures taken of wounds. Maybe pt can be enrolled into Dr. Duda's wound care research trial.   11-02-2023     11-03-2023     11-04-2023     11-05-2023     11-06-2023          11-07-2023          09" -23-2025  (Right posterior leg)      (Right anterior leg)  (Right lateral leg)  11-26-2023  (Right posterior leg)  (Right anterior leg)  (Right lateral leg)  11-29-2023      (Right posterior leg)    (Right anterior leg)         Wound cellulitis Prior to 11/26/2023 Of the RLE Continue Unna boot per orthopedic and vascular recommendation Per Dr. Lanis (vascular) who was consulted by [name] (orthopedic), recommends to continue conservative management given pt's age. Should the wound worsen or healing is stagnate, Dr. Lanis can see patient outpatient for consideration of vascular intervention Wound care consulted, images in Epic media. Wound care RN recommends medihoney.  Given unna boot is recommended to stay on until Thursday, we will assess wound healing at that time. If unna boot needs to be removed, then we can proceed with medihoney. Discussed with RN.   Per vascular on 11/23/23: 'He does have a toe pressure of 70 on the right. Given the fact he is on coumadin  for afib and INR is 2.7, he is 89 y/o with hx of HF and PPM and has a palpable right DP pulse and toe pressure of 70, feel it is reasonable to see if the ulcers improve with compression wrap.'  11/26/23 Reviewed [facility] cultures from 11-19-2023. Growing pseudomonas. Sensitive to cipro, levaquin and meropenem . Resistant to ceftaz and zosyn. Discussed with pharmacy. Still think his infection is most likely from gram positive organism but cannot ignore his outpatient culture data. Will change abx regimen over to IV meropenem  and po zyvox . Stop IV Vanco.  I think pt is going to have to delay  discharge and keep him thru weekend to make sure wound is healing properly. best case scenario is discharge on Sun/Mon. will take off Unna boot tomorrow as planned to take pictures. and then remove again/reapply at discharge. then pt can f/u with orthopedics as scheduled on Tuesday, september 30.   11/27/23  Plan on IV meropenem  and po zyvox  thru "Sunday. Discharge on Sunday evening. Likely on po levaquin and po zyvox. Dtr states they already have a Rx for zyvox at home that was written by PCP. Discussed drug-drug interactions with levaquin and coumadin. Will hold coumadin while on levaquin for 4 days(Monday September 29 - October 2). Pt to have INR drawn in clinic on October 3 to restart coumadin. Paramedics will update pictures this evening after unna boot removed and then reapplied.  11/28/23 plan to continue IV meropenem today with last dose tomorrow afternoon. Start levaquin on Monday Sept 29 and continue for a total of 4 days. That will give him a total of 7 days of abx coverage for pseudomonas. He will f/u with Dr. Duda's clinic on Tuesday, September 30. Unna boot will be removed tomorrow so repeat pictures can be taken.  Paramedics will wash wound tomorrow with hibiclens soap prior to rebandage and reapplication of unna boot.   11/29/23 last day of IV meropenem(Day #4). Pt to start levaquin tomorrow. Levaquin 500 mg qday for 4 days. Tonight is last dose of coumadin. Hold coumadin starting tomorrow. Dtr to call cardiology office to see if coumadin needs to be restarted after finishing levaquin. Pt to f/u with Dr. Duda's office on Tuesday December 01, 2023 for wound evaluate. Unna boot removed today for wound cleaning and inspection. Unna boot will be replaced after wound cleaning. New pictures taken of wounds. Maybe pt can be enrolled into Dr. Duda's wound care research trial.   Skin tear of right upper arm without complication Present on admission. Occurred after a fall while he was in the bathroom  at home on day prior to admission. Pt fell off his bathroom toilet and right upper arm hit doorframe causing skin tear.  Date Right arm Left Arm  11-24-2023    11-26-2023         09" -26-2025    11-29-2023            Chronic combined systolic and diastolic CHF (congestive heart failure) (HCC) 11/26/23 stable. Not exacerbated. On lasix  80 mg bid. And Metolazone  2.5 mg every Monday  11/27/23 stable  11/28/23 stable  11/29/23 stable. Continue with lasix  80 mg bid.  And Glorya metolazone  2.5 mg   S/P CABG x 1 11/26/23 stable  11/27/23 stable  11/28/23 stable  11/29/23 stable    Chronic anticoagulation 11/26/23 stable. Remains on coumadin . Don't want to use cipro/levaquin right now due to interactions with coumadin .  11/27/23 Will hold coumadin  while he is on Levaquin(for 4 days) after discharge. He can go to get his INR checked on October 3 and restart coumadin . Discussed this with pt's dtr scarlett.  11/28/23 continue coumadin  today and tomorrow. Will hold starting Monday night(September 20) while he is taking levaquin. Dtr to call cardiologist office to see what they want to do about continuing coumadin . Holding coumadin  as not to risk supertherapeutic INR while taking Levaquin after discharge.   11/29/23 last dose of coumadin  tonight. Hold coumadin  starting tomorrow due to pt being on Levaquin.  Dtr to call cardiology office to see if coumadin  needs to be restarted after finishing levaquin. Pt with left atrial appendage clipped. Theoretically, pt does not need systemic anticoagulation for afib now.   Permanent atrial fibrillation (HCC) - . Left atrial appendage is clipped. 11/25/2023 Home digoxin  0.125 mg qevening, metoprolol  50 mg daily resumed  11/26/23 stable. Continue digoxin .  11/27/23 stable. On digoxin  and coumadin .  Will hold coumadin  while he is on Levaquin(for 4 days) after discharge. He can go to get his INR checked on October 3 and restart coumadin . In  theory, pt could stop systemic anticoagulation altogether. Dtr scarlett doesn't want to do this until pt f/u with his cardiologist Dr. Lorrane. I urged dtr to have pt f/u with cardiologist ASAP.  11/28/23 continue coumadin  today and tomorrow. Will hold starting Monday night(September 20) while he is taking levaquin. Dtr to call cardiologist office to see what they want to do about continuing coumadin . Holding coumadin  as not to risk supertherapeutic INR while taking Levaquin after discharge.  11/29/23 last dose of coumadin  tonight. Hold coumadin  starting tomorrow due to pt being on Levaquin.  Dtr to call cardiology office to see if coumadin  needs to be restarted after finishing levaquin. Pt with left atrial appendage clipped. Theoretically, pt does not need systemic anticoagulation for afib now. Continue Toprol -XL 50 mg daily and digoxin  0.125 mg daily.    Pressure injury of coccygeal region, stage 1 Continue local wound care, Gerhardt's butt cream and mepilex sacral dressing changes  11-26-2023   11-27-2023   11-29-2023     DNR (do not resuscitate)/DNI(Do Not Intubate)   GERD (gastroesophageal reflux disease) 11/26/23 stable. On PPI  11/27/23 stable  11/28/23 stable  11/29/23 stable. Continue protonix  40 mg daily.  Pacemaker 11/26/23 stable  11/27/23 stable  11/28/23 stable  11/29/23 stable   Aortic stenosis 11/26/23 stable  11/27/23 stable  11/28/23 stable  11/29/23 stable   HLD (hyperlipidemia) 11/26/23 stable.  11/27/23 stable  11/28/23 stable  11/29/23 stable.   Essential hypertension 11/25/2023 Home metoprolol  succinate 50 mg daily  11/26/23 stable. Continue toprol -XL 50 mg daily.  11/27/23 stable  11/28/23 stable  11/29/23 continue Toprol -XL 50 mg daily, lasix  80 mg bid.    Discharge Diagnoses:  Principal Problem:   Cellulitis of right leg Active Problems:   Wound cellulitis   Permanent atrial fibrillation (HCC) - . Left atrial  appendage is clipped.   Chronic anticoagulation   S/P CABG x 1   Chronic combined systolic and diastolic CHF (congestive heart failure) (HCC)   Skin tear of right upper arm without complication   Essential hypertension   HLD (hyperlipidemia)   Aortic stenosis  Pacemaker   GERD (gastroesophageal reflux disease)   DNR (do not resuscitate)/DNI(Do Not Intubate)   Pressure injury of coccygeal region, stage 1   Discharge Instructions  Discharge Instructions     (HEART FAILURE PATIENTS) Call MD:  Anytime you have any of the following symptoms: 1) 3 pound weight gain in 24 hours or 5 pounds in 1 week 2) shortness of breath, with or without a dry hacking cough 3) swelling in the hands, feet or stomach 4) if you have to sleep on extra pillows at night in order to breathe.   Complete by: As directed    Call MD for:  difficulty breathing, headache or visual disturbances   Complete by: As directed    Call MD for:  extreme fatigue   Complete by: As directed    Call MD for:  hives   Complete by: As directed    Call MD for:  persistant dizziness or light-headedness   Complete by: As directed    Call MD for:  persistant nausea and vomiting   Complete by: As directed    Call MD for:  redness, tenderness, or signs of infection (pain, swelling, redness, odor or green/yellow discharge around incision site)   Complete by: As directed    Call MD for:  severe uncontrolled pain   Complete by: As directed    Call MD for:  temperature >100.4   Complete by: As directed    Diet - low sodium heart healthy   Complete by: As directed    50 ounces per day fluid restriction   Discharge instructions   Complete by: As directed    1. Follow up with your primary care provider in 1-2 weeks following discharge from hospital. 2. Call patient's cardiologist to ask when he should restart his coumadin . 3. Follow up with Dr. Crist office(Orthopedics) as scheduled on December 01, 2023 @ 10:15 AM as scheduled    Discharge wound care:   Complete by: As directed    1. Keep unna boot intact on right lower leg until removed by Dr. Crist office on Tuesday, November 30, 2023   Increase activity slowly   Complete by: As directed       Allergies as of 11/29/2023       Reactions   Amoxicillin Swelling   penile swelling   Lisinopril Cough   Kenalog  [triamcinolone ] Rash        Medication List     PAUSE taking these medications    warfarin 5 MG tablet Wait to take this until your doctor or other care provider tells you to start again. Commonly known as: COUMADIN  Take as directed. If you are unsure how to take this medication, talk to your nurse or doctor. Original instructions: TAKE 1 TABLET DAILY AS DIRECTED BY THE COUMADIN  CLINIC. What changed: See the new instructions.       TAKE these medications    acetaminophen  325 MG tablet Commonly known as: TYLENOL  Take 650 mg by mouth every 6 (six) hours as needed for moderate pain.   Dialyvite Vitamin D  5000 125 MCG (5000 UT) capsule Generic drug: Cholecalciferol  Take 5,000 Units by mouth 2 (two) times a week. Mondays and Fridays   digoxin  0.125 MG tablet Commonly known as: LANOXIN  Take 1 tablet (0.125 mg total) by mouth every evening.   furosemide  80 MG tablet Commonly known as: LASIX  Take 1 tablet (80 mg total) by mouth 2 (two) times daily. What changed:  when to take this reasons to take  this   Gerhardt's butt cream Crea Apply 1 Application topically 2 (two) times daily for 10 days.   leptospermum manuka honey Pste paste Apply 1 Application topically daily for 7 days. Interchangeable with TheraHoney  Cleanse trauma wound to right posterior leg with NS and pat dry Apply medihoney to wound bed to facilitate debridement. Cover with dry dressing. Change daily. Apply thin layer (3 mm) to wound.   levofloxacin 250 MG tablet Commonly known as: Levaquin Take 2 tablets (500 mg total) by mouth daily for 4 days. Start taking on:  November 30, 2023   linezolid  600 MG tablet Commonly known as: ZYVOX  Take 1 tablet (600 mg total) by mouth every 12 (twelve) hours for 7 days.   metolazone  2.5 MG tablet Commonly known as: ZAROXOLYN  Take 1 tablet (2.5 mg total) by mouth once a week. What changed: additional instructions   metoprolol  succinate 50 MG 24 hr tablet Commonly known as: TOPROL -XL Take 1 tablet (50 mg total) by mouth daily. Take with or immediately following a meal.   mupirocin  ointment 2 % Commonly known as: BACTROBAN  Apply 1 Application topically 2 (two) times daily.   pantoprazole  40 MG tablet Commonly known as: PROTONIX  Take 40 mg by mouth daily.   Probiotic Acidophilus Caps Take 1 capsule by mouth in the morning and at bedtime for 10 days.   VITAMIN C PO Take 1 tablet by mouth daily.               Discharge Care Instructions  (From admission, onward)           Start     Ordered   11/28/23 0000  Discharge wound care:       Comments: 1. Keep unna boot intact on right lower leg until removed by Dr. Crist office on Tuesday, November 30, 2023   11/28/23 1458            Follow-up Information     Harden Jerona GAILS, MD Follow up in 1 week(s).   Specialty: Orthopedic Surgery Why: Appointment scheduled for tuesday Contact information: [address] [name]  [place] [address] [phone]                Allergies  Allergen Reactions   Amoxicillin Swelling    penile swelling   Lisinopril Cough   Kenalog  [Triamcinolone ] Rash    Discharge Exam: Vitals:   11/29/23 0517 11/29/23 1000  BP:  124/67  Pulse: 61 65  Resp: 20   Temp:  97.9 F (36.6 C)  SpO2: 93% 95%   Physical examination by paramedic on-site. Documented in this note for clarity Physical Exam Vitals reviewed.  Constitutional:      Appearance: Normal appearance.  Eyes:     General: No scleral icterus. Cardiovascular:     Rate and Rhythm: Normal rate.  Pulmonary:     Effort: Pulmonary effort is  normal.     Breath sounds: Normal breath sounds.  Abdominal:     General: Abdomen is flat. Bowel sounds are normal.     Palpations: Abdomen is soft.  Skin:    General: Skin is warm and dry.     Capillary Refill: Capillary refill takes less than 2 seconds.     Comments: Wounds pics taken. See pics in assessment and plan section  Neurological:     Mental Status: He is alert. He is disoriented.     Comments: Hard of hearing on right side     The results of significant diagnostics from this  hospitalization (including imaging, microbiology, ancillary and laboratory) are listed below for reference.    Microbiology:     Labs: BNP (last 3 results) Recent Labs    04/25/23 0533 08/20/23 1139  BNP 802.0* 781.0*   Basic Metabolic Panel: Recent Labs  Lab 11/24/23 1355 11/25/23 1006 11/26/23 1017  NA 138 138 138  K 4.0 3.7 3.5  CL 97* 98 95*  CO2 30 28 27   GLUCOSE 109* 102* 111*  BUN 25* 28* 31*  CREATININE 1.04 0.90 0.90  CALCIUM  9.1 9.2 9.0   CBC: Recent Labs  Lab 11/24/23 0500 11/25/23 1006 11/26/23 1017 11/28/23 1159  WBC 15.0* 14.5* 14.5* 12.4*  NEUTROABS  --   --   --  6.7  HGB 9.3* 9.1* 9.4* 9.1*  HCT 29.2* 28.3* 29.2* 28.5*  MCV 98.6 96.3 96.7 98.3  PLT 367 372 352 330   Sepsis Labs Recent Labs  Lab 11/24/23 0500 11/25/23 1006 11/26/23 1017 11/28/23 1159  WBC 15.0* 14.5* 14.5* 12.4*   Procedures/Studies: VAS US  ABI WITH/WO TBI Result Date: 11/22/2023  LOWER EXTREMITY DOPPLER STUDY Patient Name:  TAREQ DWAN  Date of Exam:   11/21/2023 Medical Rec #: [id number]             Accession #:    [id number] Date of Birth: 1927-11-13             Patient Gender: M Patient Age:   49 years Exam Location:  [facility] Procedure:      VAS US  ABI WITH/WO TBI Referring Phys: DONALDA APPLEBAUM --------------------------------------------------------------------------------  Indications: Traumatic venous ulceration status post fall. High Risk Factors:  Hypertension, hyperlipidemia, no history of smoking, coronary                    artery disease. Other Factors: Venous insufficiency, right > left. Pacemaker. Atrial                fibrillation/flutter. CHF. History of aortic valve replacement                2016  Limitations: Today's exam was limited due to involuntary patient movement and              Arrythmia. Comparison Study: No prior study on file Performing Technologist: Elder Levan Chief Tech  Examination Guidelines: A complete evaluation includes at minimum, Doppler waveform signals and systolic blood pressure reading at the level of bilateral brachial, anterior tibial, and posterior tibial arteries, when vessel segments are accessible. Bilateral testing is considered an integral part of a complete examination. Photoelectric Plethysmograph (PPG) waveforms and toe systolic pressure readings are included as required and additional duplex testing as needed. Limited examinations for reoccurring indications may be performed as noted.  ABI Findings: +---------+------------------+-----+-----------+--------+ Right    Rt Pressure (mmHg)IndexWaveform   Comment  +---------+------------------+-----+-----------+--------+ Brachial 139                    triphasic           +---------+------------------+-----+-----------+--------+ PTA      240               1.69 multiphasic         +---------+------------------+-----+-----------+--------+ DP       231               1.63 multiphasic         +---------+------------------+-----+-----------+--------+ Burnetta Ort  0.49 Abnormal            +---------+------------------+-----+-----------+--------+ +---------+------------------+-----+-----------+-------+ Left     Lt Pressure (mmHg)IndexWaveform   Comment +---------+------------------+-----+-----------+-------+ Brachial 142                    triphasic          +---------+------------------+-----+-----------+-------+  PTA      254               1.79 multiphasic        +---------+------------------+-----+-----------+-------+ DP       251               1.77 multiphasic        +---------+------------------+-----+-----------+-------+ Great Toe67                0.47 Abnormal           +---------+------------------+-----+-----------+-------+ +-------+---------------------+-----------+------------+------------+ ABI/TBIToday's ABI          Today's TBIPrevious ABIPrevious TBI +-------+---------------------+-----------+------------+------------+ Right  1.69/non compressible0.49                                +-------+---------------------+-----------+------------+------------+ Left   1.79/non compressible0.47                                +-------+---------------------+-----------+------------+------------+ Arterial wall calcification precludes accurate ankle pressures and ABIs.  Summary: Right: Resting right ankle-brachial index indicates noncompressible right lower extremity arteries. The right toe-brachial index is abnormal.  Left: Resting left ankle-brachial index indicates noncompressible left lower extremity arteries. The left toe-brachial index is abnormal.  *See table(s) above for measurements and observations.  Electronically signed by Penne Colorado MD on 11/22/2023 at 12:03:43 PM.    Final    CUP PACEART REMOTE DEVICE CHECK Result Date: 11/18/2023 PPM Scheduled remote reviewed. Normal device function.  Presenting rhythm: VP-VS. Next remote 91 days. MC, CVRS  DG Tibia/Fibula Right Result Date: 11/02/2023 CLINICAL DATA:  89 year old male with wound infection. EXAM: RIGHT TIBIA AND FIBULA - 2 VIEW COMPARISON:  None Available. FINDINGS: Four views. Advanced calcified peripheral vascular disease. Generalized soft tissue swelling and stranding. Maintained alignment at the right knee and ankle. No evidence of knee joint effusion. Right tibia and fibula appear intact. Partially visible possible  neuropathic bony changes in the foot. But no acute osseous abnormality identified. IMPRESSION: Generalized soft tissue swelling. Advanced calcified peripheral vascular disease. No acute osseous abnormality identified in the right tib-fib. Electronically Signed   By: VEAR Hurst M.D.   On: 11/02/2023 12:55    Time coordinating discharge: 55 mins  SIGNED:  Camellia Door, DO [facility] 11/29/23, 2:29 PM

## 2023-11-29 NOTE — Progress Notes (Signed)
 Hospital at Home Progress Note    [name]  FMW:[id number] DOB: 16-Jul-1927 DOA: 11/20/2023 PCP: [name], [name]  E, PA  Subjective: Pt seen today for video call. Dtr scarlett at bedside. Reviewed DC instructions. Today is final day of IV meropenem . Pt to start levaquin tomorrow. Levaquin 500 mg qday for 4 days. Tonight is last dose of coumadin . Hold coumadin  starting tomorrow. Dtr to call cardiology office to see if coumadin  needs to be restarted after finishing levaquin.  Pt to f/u with Dr. Crist office on Tuesday December 01, 2023 for wound evaluate. Unna boot removed today for wound cleaning and inspection. Unna boot will be replaced after wound cleaning. New pictures taken of wounds.   Provider Location: [place], [place] Patient examined by: sarah(paramedic) Patient identified as Robert [name] and Date of Birth of Aug 31, 1927.  Hospital Course: CC: wound infection HPI: Robert [name] is a 89 y.o. male with medical history significant Patient is a 89 y.o.  male with history of HTN, HLD, A-fib on Coumadin , CAD s/p CABG, aortic stenosis-s/p aortic valve replacement, PPM implantation, chronic HFpEF who was recently hospitalized and then transition to hospital at home program-discharged on 9/6 for RLE ulcer with cellulitis-presented to the hospital with worsening right leg swelling/erythema and development of 2 additional ulcers.   Per history obtained-since this past Monday (9/15)-he developed 2 additional ulcers in his right leg-since then he has had some greenish appearing discharge from these ulcers.  His right leg has become more erythematous and swollen compared to the past few days.  He was seen by his primary care practitioner-superficial cultures were obtained-a referral to [name] was placed-he was subsequently restarted on an oral antibiotic.  Due to continued worsening of his right leg wound/erythema/swelling-he presented to the ED for evaluation-subsequently the  hospitalist service was asked to admit this patient for further evaluation and treatment  Significant Events: Admitted 11/20/2023 for right lower leg cellulitis 11-23-2023 [facility] to [facility] 11-24-2023 continued on IV vancomycin  11-26-2023 outpt wound cx grew pseudomonas. IV abx changed to meropenem  11-29-2023 pt completes last dose of IV meropenem (4 days of therapy). Pt discharged from [facility] program. Pt will start 4 days of Levaquin therapy on 11-30-2023.  Admission Labs: Na 138, K 3.6, CO2 of 29, BUN 41, scr 1.13, glu 108 T. Prot 6.3, alb 3.3, AST 21, ALT 18, alk phos 33, T. Bili 1.0 WBC 15.7, HgB 8.6, plt 304 Lactic acid 0.6  Antibiotic Therapy: Anti-infectives (From admission, onward)    Start     Dose/Rate Route Frequency Ordered Stop   11/23/23 2000  vancomycin  (VANCOREADY) IVPB 750 mg/150 mL        750 mg 150 mL/hr over 60 Minutes Intravenous Every 24 hours 11/23/23 1628     11/21/23 2015  vancomycin  (VANCOREADY) IVPB 750 mg/150 mL  Status:  Discontinued       Placed in Followed by Linked Group   750 mg 150 mL/hr over 60 Minutes Intravenous Every 24 hours 11/20/23 1924 11/23/23 1434   11/21/23 1800  vancomycin  (VANCOREADY) IVPB 750 mg/150 mL  Status:  Discontinued        750 mg 150 mL/hr over 60 Minutes Intravenous Every 24 hours 11/20/23 1744 11/20/23 1923   11/20/23 2015  vancomycin  (VANCOREADY) IVPB 1250 mg/250 mL       Placed in Followed by Linked Group   1,250 mg 166.7 mL/hr over 90 Minutes Intravenous  Once 11/20/23 1924 11/20/23 2158   11/20/23 1600  vancomycin  (VANCOREADY) IVPB 1250 mg/250 mL  Status:  Discontinued        1,250 mg 166.7 mL/hr over 90 Minutes Intravenous  Once 11/20/23 1545 11/20/23 1923   11/20/23 1545  vancomycin  (VANCOCIN ) IVPB 1000 mg/200 mL premix  Status:  Discontinued        1,000 mg 200 mL/hr over 60 Minutes Intravenous  Once 11/20/23 1537 11/20/23 1545        Procedures:   Consultants: Orthopedics(Duda) Vascular Surgery(Robins)    Assessment and Plan: * Cellulitis of right leg 11/25/2023 Continue vancomycin  IV to complete 7 day course on 11/27/23  11/26/23 Reviewed [facility] cultures from 11-19-2023. Growing pseudomonas. Sensitive to cipro, levaquin and meropenem . Resistant to ceftaz and zosyn. Discussed with pharmacy. Still think his infection is most likely from gram positive organism but cannot ignore his outpatient culture data. Will change abx regimen over to IV meropenem  and po zyvox . Stop IV Vanco.  I think pt is going to have to delay discharge and keep him thru weekend to make sure wound is healing properly. best case scenario is discharge on Sun/Mon. will take off Unna boot tomorrow as planned to take pictures. and then remove again/reapply at discharge. then pt can f/u with orthopedics as scheduled on Tuesday, september 30.  11/27/23  Plan on IV meropenem  and po zyvox  thru "Sunday. Discharge on Sunday evening. Likely on po levaquin and po zyvox. Dtr states they already have a Rx for zyvox at home that was written by PCP. Discussed drug-drug interactions with levaquin and coumadin. Will hold coumadin while on levaquin for 4 days(Monday September 29 - October 2). Pt to have INR drawn in clinic on October 3 to restart coumadin. Paramedics will update pictures this evening after unna boot removed and then reapplied.  11/28/23 plan to continue IV meropenem today with last dose tomorrow afternoon. Start levaquin on Monday Sept 29 and continue for a total of 4 days. That will give him a total of 7 days of abx coverage for pseudomonas. He will f/u with Dr. Duda's clinic on Tuesday, September 30. Unna boot will be removed tomorrow so repeat pictures can be taken.  Paramedics will wash wound tomorrow with hibiclens soap prior to rebandage and reapplication of unna boot.  11/29/23 last day of IV meropenem(Day #4). Pt to start levaquin tomorrow.  Levaquin 500 mg qday for 4 days. Tonight is last dose of coumadin. Hold coumadin starting tomorrow. Dtr to call cardiology office to see if coumadin needs to be restarted after finishing levaquin. Pt to f/u with Dr. Duda's office on Tuesday December 01, 2023 for wound evaluate. Unna boot removed today for wound cleaning and inspection. Unna boot will be replaced after wound cleaning. New pictures taken of wounds. Maybe pt can be enrolled into Dr. Duda's wound care research trial.   11-02-2023     11-03-2023     11-04-2023     11-05-2023     11-06-2023          11-07-2023          09" -23-2025  (Right posterior leg)      (Right anterior leg)  (Right lateral leg)  11-26-2023  (Right posterior leg)  (Right anterior leg)  (Right lateral leg)  11-29-2023      (Right posterior leg)    (Right anterior leg)         Wound cellulitis Prior to 11/26/2023 Of the RLE Continue Unna boot per orthopedic and vascular recommendation Per Dr. Lanis (vascular) who was  consulted by [name] (orthopedic), recommends to continue conservative management given pt's age. Should the wound worsen or healing is stagnate, Dr. Lanis can see patient outpatient for consideration of vascular intervention Wound care consulted, images in Epic media. Wound care RN recommends medihoney. Given unna boot is recommended to stay on until Thursday, we will assess wound healing at that time. If unna boot needs to be removed, then we can proceed with medihoney. Discussed with RN.   Per vascular on 11/23/23: 'He does have a toe pressure of 70 on the right. Given the fact he is on coumadin  for afib and INR is 2.7, he is 89 y/o with hx of HF and PPM and has a palpable right DP pulse and toe pressure of 70, feel it is reasonable to see if the ulcers improve with compression wrap.'  11/26/23 Reviewed [facility] cultures from 11-19-2023. Growing pseudomonas. Sensitive to cipro, levaquin and meropenem . Resistant to ceftaz and zosyn.  Discussed with pharmacy. Still think his infection is most likely from gram positive organism but cannot ignore his outpatient culture data. Will change abx regimen over to IV meropenem  and po zyvox . Stop IV Vanco.  I think pt is going to have to delay discharge and keep him thru weekend to make sure wound is healing properly. best case scenario is discharge on Sun/Mon. will take off Unna boot tomorrow as planned to take pictures. and then remove again/reapply at discharge. then pt can f/u with orthopedics as scheduled on Tuesday, september 30.   11/27/23  Plan on IV meropenem  and po zyvox  thru "Sunday. Discharge on Sunday evening. Likely on po levaquin and po zyvox. Dtr states they already have a Rx for zyvox at home that was written by PCP. Discussed drug-drug interactions with levaquin and coumadin. Will hold coumadin while on levaquin for 4 days(Monday September 29 - October 2). Pt to have INR drawn in clinic on October 3 to restart coumadin. Paramedics will update pictures this evening after unna boot removed and then reapplied.  11/28/23 plan to continue IV meropenem today with last dose tomorrow afternoon. Start levaquin on Monday Sept 29 and continue for a total of 4 days. That will give him a total of 7 days of abx coverage for pseudomonas. He will f/u with Dr. Duda's clinic on Tuesday, September 30. Unna boot will be removed tomorrow so repeat pictures can be taken.  Paramedics will wash wound tomorrow with hibiclens soap prior to rebandage and reapplication of unna boot.   11/29/23 last day of IV meropenem(Day #4). Pt to start levaquin tomorrow. Levaquin 500 mg qday for 4 days. Tonight is last dose of coumadin. Hold coumadin starting tomorrow. Dtr to call cardiology office to see if coumadin needs to be restarted after finishing levaquin. Pt to f/u with Dr. Duda's office on Tuesday December 01, 2023 for wound evaluate. Unna boot removed today for wound cleaning and inspection. Unna boot will be  replaced after wound cleaning. New pictures taken of wounds. Maybe pt can be enrolled into Dr. Duda's wound care research trial.   Skin tear of right upper arm without complication Present on admission. Occurred after a fall while he was in the bathroom at home on day prior to admission. Pt fell off his bathroom toilet and right upper arm hit doorframe causing skin tear.  Date Right arm Left Arm  11-24-2023    11-26-2023         09" -26-2025    11-29-2023  Chronic combined systolic and diastolic CHF (congestive heart failure) (HCC) 11/26/23 stable. Not exacerbated. On lasix  80 mg bid. And Metolazone  2.5 mg every Monday  11/27/23 stable  11/28/23 stable  11/29/23 stable. Continue with lasix  80 mg bid. And Glorya metolazone  2.5 mg   S/P CABG x 1 11/26/23 stable  11/27/23 stable  11/28/23 stable  11/29/23 stable    Chronic anticoagulation 11/26/23 stable. Remains on coumadin . Don't want to use cipro/levaquin right now due to interactions with coumadin .  11/27/23 Will hold coumadin  while he is on Levaquin(for 4 days) after discharge. He can go to get his INR checked on October 3 and restart coumadin . Discussed this with pt's dtr scarlett.  11/28/23 continue coumadin  today and tomorrow. Will hold starting Monday night(September 20) while he is taking levaquin. Dtr to call cardiologist office to see what they want to do about continuing coumadin . Holding coumadin  as not to risk supertherapeutic INR while taking Levaquin after discharge.   11/29/23 last dose of coumadin  tonight. Hold coumadin  starting tomorrow due to pt being on Levaquin.  Dtr to call cardiology office to see if coumadin  needs to be restarted after finishing levaquin. Pt with left atrial appendage clipped. Theoretically, pt does not need systemic anticoagulation for afib now.   Permanent atrial fibrillation (HCC) - . Left atrial appendage is clipped. 11/25/2023 Home digoxin  0.125 mg qevening,  metoprolol  50 mg daily resumed  11/26/23 stable. Continue digoxin .  11/27/23 stable. On digoxin  and coumadin .  Will hold coumadin  while he is on Levaquin(for 4 days) after discharge. He can go to get his INR checked on October 3 and restart coumadin . In theory, pt could stop systemic anticoagulation altogether. Dtr scarlett doesn't want to do this until pt f/u with his cardiologist Dr. Lorrane. I urged dtr to have pt f/u with cardiologist ASAP.  11/28/23 continue coumadin  today and tomorrow. Will hold starting Monday night(September 20) while he is taking levaquin. Dtr to call cardiologist office to see what they want to do about continuing coumadin . Holding coumadin  as not to risk supertherapeutic INR while taking Levaquin after discharge.  11/29/23 last dose of coumadin  tonight. Hold coumadin  starting tomorrow due to pt being on Levaquin.  Dtr to call cardiology office to see if coumadin  needs to be restarted after finishing levaquin. Pt with left atrial appendage clipped. Theoretically, pt does not need systemic anticoagulation for afib now. Continue Toprol -XL 50 mg daily and digoxin  0.125 mg daily.    Pressure injury of coccygeal region, stage 1 Continue local wound care, Gerhardt's butt cream and mepilex sacral dressing changes  11-26-2023   11-27-2023   11-29-2023     DNR (do not resuscitate)/DNI(Do Not Intubate)   GERD (gastroesophageal reflux disease) 11/26/23 stable. On PPI  11/27/23 stable  11/28/23 stable  11/29/23 stable. Continue protonix  40 mg daily.  Pacemaker 11/26/23 stable  11/27/23 stable  11/28/23 stable  11/29/23 stable   Aortic stenosis 11/26/23 stable  11/27/23 stable  11/28/23 stable  11/29/23 stable   HLD (hyperlipidemia) 11/26/23 stable.  11/27/23 stable  11/28/23 stable  11/29/23 stable.   Essential hypertension 11/25/2023 Home metoprolol  succinate 50 mg daily  11/26/23 stable. Continue toprol -XL 50 mg daily.  11/27/23  stable  11/28/23 stable  11/29/23 continue Toprol -XL 50 mg daily, lasix  80 mg bid.   DVT prophylaxis:   coumadin    Code Status: Limited: Do not attempt resuscitation (DNR) -DNR-LIMITED -Do Not Intubate/DNI  Family Communication: discussed with pt's dtr scarlett during video call Disposition Plan: home Reason for continuing  need for hospitalization: stable for DC  Objective: Vitals:   11/28/23 2202 11/29/23 0247 11/29/23 0517 11/29/23 1000  BP:    124/67  Pulse: 62 60 61 65  Resp: 12 19 20    Temp:    97.9 F (36.6 C)  TempSrc:    Oral  SpO2: 96% 94% 93% 95%  Weight:      Height:        Intake/Output Summary (Last 24 hours) at 11/29/2023 1427 Last data filed at 11/28/2023 1910 Gross per 24 hour  Intake 120 ml  Output --  Net 120 ml   Filed Weights   11/23/23 1755 11/27/23 1045 11/28/23 1800  Weight: 66.8 kg 64.4 kg 65.5 kg    Examination: Note that physical examination performed by paramedic on-scene and documented by provider Video Exam performed by video enabled technology  Physical Exam Vitals reviewed.  Constitutional:      Appearance: Normal appearance.  Eyes:     General: No scleral icterus. Cardiovascular:     Rate and Rhythm: Normal rate.  Pulmonary:     Effort: Pulmonary effort is normal.     Breath sounds: Normal breath sounds.  Abdominal:     General: Abdomen is flat. Bowel sounds are normal.     Palpations: Abdomen is soft.  Skin:    General: Skin is warm and dry.     Capillary Refill: Capillary refill takes less than 2 seconds.     Comments: Wounds pics taken. See pics in assessment and plan section  Neurological:     Mental Status: He is alert. He is disoriented.     Comments: Hard of hearing on right side    Data Reviewed: I have personally reviewed following labs and imaging studies  CBC: Recent Labs  Lab 11/24/23 0500 11/25/23 1006 11/26/23 1017 11/28/23 1159  WBC 15.0* 14.5* 14.5* 12.4*  NEUTROABS  --   --   --  6.7  HGB 9.3*  9.1* 9.4* 9.1*  HCT 29.2* 28.3* 29.2* 28.5*  MCV 98.6 96.3 96.7 98.3  PLT 367 372 352 330   Basic Metabolic Panel: Recent Labs  Lab 11/24/23 1355 11/25/23 1006 11/26/23 1017  NA 138 138 138  K 4.0 3.7 3.5  CL 97* 98 95*  CO2 30 28 27   GLUCOSE 109* 102* 111*  BUN 25* 28* 31*  CREATININE 1.04 0.90 0.90  CALCIUM  9.1 9.2 9.0   GFR: Estimated Creatinine Clearance: 42 mL/min (by C-G formula based on SCr of 0.9 mg/dL). Coagulation Profile: Recent Labs  Lab 11/24/23 0500 11/25/23 1006 11/26/23 1017 11/27/23 1053 11/28/23 1159  INR 3.2* 2.6* 2.3* 1.9* 2.0*   BNP (last 3 results) Recent Labs    04/25/23 0533 08/20/23 1139  BNP 802.0* 781.0*    Scheduled Meds:  cholecalciferol   5,000 Units Oral Once per day on Monday Thursday   digoxin   0.125 mg Oral QPM   docusate sodium   100 mg Oral BID   furosemide   80 mg Oral BID   Gerhardt's butt cream   Topical BID   lactobacillus acidophilus  2 tablet Oral BID   leptospermum manuka honey  1 Application Topical Daily   linezolid   600 mg Oral Q12H   metolazone   2.5 mg Oral Q Mon   metoprolol  succinate  50 mg Oral Daily   pantoprazole   40 mg Oral Daily   warfarin  4 mg Oral q1600   Warfarin - Pharmacist Dosing Inpatient   Does not apply q1600   Continuous Infusions:  meropenem  (MERREM ) IV Stopped (11/29/23 1043)     LOS: 9 days   Time spent: 60 minutes  Camellia Door, DO  [facility]  11/29/2023, 2:27 PM

## 2023-11-29 NOTE — Progress Notes (Addendum)
 [id number]--Alarm Hypoxia (modified) for SpO2 (85-86%)-outbound call to Daughter/caregiver at [phone]. Daughter confirmed will go to check on patient and return call to RN with patient status update.   0239-Inbound call received from Daughter/caregiver, who confirmed patient is stable, sleeping in his recliner, patient may have been laying on current health sensor, denies any respiratory concerns or distress noted from patient. Caregiver/daughter encouraged to call with any needs, [facility] contact information reviewed, patient will continue to be monitored SPO2% up to 90-93% by end of contact call.   0631--patient remained stable overall overnight, no additional alerts on inbound calls for Hypoxia. Patient may have been laying on senor or shifted the placement while sleeping. Other vitals were closely watched due to non alerting fluctuating SPO2% readings. Patient will continue to be monitored throughout shift.

## 2023-11-29 NOTE — Assessment & Plan Note (Signed)
 Present on admission. Occurred after a fall while he was in the bathroom at home on day prior to admission. Pt fell off his bathroom toilet and right upper arm hit doorframe causing skin tear.  Date Right arm Left Arm  11-24-2023    11-26-2023         11-27-2023    11-29-2023

## 2023-11-30 ENCOUNTER — Encounter: Payer: Self-pay | Admitting: Internal Medicine

## 2023-11-30 DIAGNOSIS — I4821 Permanent atrial fibrillation: Secondary | ICD-10-CM | POA: Diagnosis not present

## 2023-11-30 NOTE — Telephone Encounter (Signed)
 Chart reviewed: Patient with atrial fibrillation and infection requiring antibiotics from Dr. Laurence Patients indication  for coumadin .  Have a left atrial appendage ligation in 2015.  No residual appendage in 2022 TEE.  We had discussed stopping coumadin  at that time.  Patient may need repeat antibiotics again and would need to stop coumadin  again. - I  would offer stopping coumadin  in favor of aspirin   (s/p CABG, s/p AVR) given these risks and benefits.  Stanly Leavens, MD [facility] Cardiologist [facility]  [address] [place], [place] [address] [phone]  3:24 PM    Please see the MyChart message reply(ies) for my assessment and plan.    This patient gave consent for this Medical Advice Message and is aware that it may result in a bill to [employer], as well as the possibility of receiving a bill for a co-payment or deductible. They are an established patient, but are not seeking medical advice exclusively about a problem treated during an in person or video visit in the last seven days. I did not recommend an in person or video visit within seven days of my reply.    I spent a total of 7 minutes cumulative time within 7 days through [employer].  Stanly DELENA Leavens, MD

## 2023-12-01 ENCOUNTER — Encounter: Payer: Self-pay | Admitting: Family

## 2023-12-01 ENCOUNTER — Ambulatory Visit (INDEPENDENT_AMBULATORY_CARE_PROVIDER_SITE_OTHER): Admitting: Family

## 2023-12-01 ENCOUNTER — Telehealth: Payer: Self-pay | Admitting: Family

## 2023-12-01 DIAGNOSIS — L97211 Non-pressure chronic ulcer of right calf limited to breakdown of skin: Secondary | ICD-10-CM | POA: Diagnosis not present

## 2023-12-01 DIAGNOSIS — I739 Peripheral vascular disease, unspecified: Secondary | ICD-10-CM

## 2023-12-01 DIAGNOSIS — L0390 Cellulitis, unspecified: Secondary | ICD-10-CM | POA: Diagnosis not present

## 2023-12-01 NOTE — Progress Notes (Signed)
 Office Visit Note   Patient: Robert [name]           Date of Birth: [birth year]           MRN: [id number] Visit Date: 12/01/2023              Requested by: [name], [name]  E, PA [address] [place],  [place] [address] PCP: [name] Gene FORBES, PA  Chief Complaint  Patient presents with   Right Leg - Wound Check      HPI: The patient is a 89 year old gentleman who is seen today in hospitalization follow-up for right lower extremity ulcer with cellulitis he has had recent hospitalization x 2 for the same.  Unfortunately despite hospital at home and close care the ulcer continues to worsen.  Has been in a Unna compression wrap to the lower extremity.  Concern for worsening of the ulcer and new ulcer along the anterior tibia  While hospitalized was seen by vascular as well ABIs were performed.  These were abnormal however at that time it was felt that if the ulcer was healing they would hold off on intervention.  Assessment & Plan: Visit Diagnoses:  1. PAD (peripheral artery disease)     Plan: Will stop the compression wrap as he has new ischemic ulcers anteriorly as well as appears to be having issues with maceration and further skin breakdown.  Will begin Vashe to dry dressing changes to be done daily.  Provided Allevyn foam to place over the anterior shin.  Have placed an urgent vascular referral as well  Hope to enroll in a skin graft study and follow-up in 1 week if we can improve the ulcers  Follow-Up Instructions: No follow-ups on file.   Ortho Exam  Patient is alert, oriented, no adenopathy, well-dressed, normal affect, normal respiratory effort. On examination right lower extremity there is trace edema he does have ischemic changes over the anterior bony prominence of his tibia.  There is no breakdown of the skin currently.  The posterior lateral ulceration appears to have broken down further this is filled in with nearly full fibrinous exudative  tissue was debrided of some hematoma and necrotic tissue laterally.  There is no surrounding cellulitis or warmth no purulence  The lateral ulcer is 4 cm x 1-1/2 cm filled in with fibrinous exudative tissues maceration surrounding.  The posterior ulcer appears to be gone as the medial ulcer which measures about 5 x 3-1/2 cm however this has been engulfed by the the posterior ulcer overall this ulcer measures 11 cm x 8 cm with 3 mm of depth filled in with 100% fibrinous exudative tissue the patient does not tolerate full debridement.  Imaging: No results found.   Labs: Lab Results  Component Value Date   HGBA1C 5.8 (H) 11/07/2023   HGBA1C 5.7 (H) 05/10/2013   HGBA1C 5.7 (H) 05/06/2013   ESRSEDRATE 63 (H) 11/07/2023   ESRSEDRATE 65 (H) 11/02/2023   CRP 2.8 (H) 11/07/2023   CRP 7.6 (H) 11/02/2023   REPTSTATUS 11/07/2023 FINAL 11/02/2023   GRAMSTAIN  12/28/2007    FEW WBC PRESENT,BOTH PMN AND MONONUCLEAR NO SQUAMOUS EPITHELIAL CELLS SEEN NO ORGANISMS SEEN   GRAMSTAIN  12/28/2007    FEW WBC PRESENT,BOTH PMN AND MONONUCLEAR NO SQUAMOUS EPITHELIAL CELLS SEEN NO ORGANISMS SEEN   CULT  11/02/2023    NO GROWTH 5 DAYS Performed at [facility], [address], [place], [place] [address]      Lab  Results  Component Value Date   ALBUMIN  3.3 (L) 11/20/2023   ALBUMIN  3.5 11/03/2023   ALBUMIN  3.9 11/02/2023   PREALBUMIN 18.3 05/10/2013    Lab Results  Component Value Date   MG 1.8 04/25/2023   MG 1.7 04/24/2023   MG 2.1 05/01/2014   No results found for: [facility]  Lab Results  Component Value Date   PREALBUMIN 18.3 05/10/2013      Latest Ref Rng & Units 11/28/2023   11:59 AM 11/26/2023   10:17 AM 11/25/2023   10:06 AM  CBC EXTENDED  WBC 4.0 - 10.5 K/uL 12.4  14.5  14.5   RBC 4.22 - 5.81 MIL/uL 2.90  3.02  2.94   Hemoglobin 13.0 - 17.0 g/dL 9.1  9.4  9.1   HCT 60.9 - 52.0 % 28.5  29.2  28.3   Platelets 150 - 400 K/uL 330  352  372   NEUT# 1.7 - 7.7 K/uL 6.7      Lymph# 0.7 - 4.0 K/uL 4.4        There is no height or weight on file to calculate BMI.  Orders:  Orders Placed This Encounter  Procedures   Ambulatory referral to Vascular Surgery   No orders of the defined types were placed in this encounter.    Procedures: No procedures performed  Clinical Data: No additional findings.  ROS:  All other systems negative, except as noted in the HPI. Review of Systems  Objective: Vital Signs: There were no vitals taken for this visit.  Specialty Comments:  No specialty comments available.  PMFS History: Patient Active Problem List   Diagnosis Date Noted   Skin tear of right upper arm without complication 11/29/2023   Pressure injury of coccygeal region, stage 1 11/27/2023   Wound cellulitis 11/25/2023   DNR (do not resuscitate)/DNI(Do Not Intubate) 11/07/2023   Carcinoma of prostate (HCC) 11/02/2023   Cellulitis of right leg 11/02/2023   S/P CABG x 1 08/12/2023   Chronic combined systolic and diastolic CHF (congestive heart failure) (HCC) 08/12/2023   Subtherapeutic international normalized ratio (INR) 04/24/2023   Anemia of chronic disease 04/24/2023   Pacemaker 09/06/2020   Tachycardia-bradycardia syndrome (HCC) 05/03/2020   Secondary hypercoagulable state 04/04/2020   Mixed conductive and sensorineural hearing loss of both ears 09/14/2018   Encounter for therapeutic drug monitoring 06/02/2014   Chronic anticoagulation 05/17/2014   Atrial flutter (HCC) 06/08/2013   S/P AVR - tissue AVR 05/2013 05/12/2013   Aortic stenosis    Coronary atherosclerosis of native coronary artery 04/21/2013   Essential hypertension 04/21/2013   HLD (hyperlipidemia) 04/21/2013   Permanent atrial fibrillation (HCC) - . Left atrial appendage is clipped. 04/21/2013   GERD (gastroesophageal reflux disease) 04/21/2013   Past Medical History:  Diagnosis Date   Acute CHF (HCC) 05/06/2013   Acute respiratory failure with hypoxia (HCC) 04/24/2023    Aortic stenosis    a. s/p tissue AVR 05/2013;  b. Echo (06/2013):  Mod LVH, EF 60-65%, no RWMA, Gr 2 DD, AVR ok (mean 13 mmHg), MAC, mild MR, mod LAE, mild RAE, PASP 46 mmHg (mild pulmo HTN)   Atrial fibrillation with RVR (HCC) 05/06/2013   Blood loss anemia    a. 05/2009 a/w GIB.   CAD (coronary artery disease)    a. Cath 04/2009: BMS to LCx 04/2009; CTO of RCA with L-R collaterals, 40% ostial diag.   Carpal tunnel syndrome    bilateral, carpal tunnel release x 2   Esophageal ulcer  a. 05/2009 with hemorrhage - injected with epinephrine -Dr. Lamar Bunk.   Esophagitis    a. 05/2009 a/w GIB.   GERD (gastroesophageal reflux disease)    EGD, Dr. Quay 2008   GI bleed    a. 05/2009 as above: with associated anemia. Lower esophageal ulcer with extensive erosive esophagitis and large clot by EGD 05/2009.    Hx of colonoscopy 2008   Dr. Cindy, polyps- 5 yr f/u recommended   Hyperlipidemia    Hypertension    Influenza A 04/24/2023   PAF (paroxysmal atrial fibrillation) (HCC)    a. Post-op AVR;  b. 04/2014 recurrent.   Perforated ear drum    right ear drum, tube in left ear drum, Dr. Jesus   Polymyalgia rheumatica    Prostate cancer [facility])    Vitamin D  deficiency     Family History  Problem Relation Age of Onset   CVA Mother    Kidney disease Father    Breast cancer Daughter     Past Surgical History:  Procedure Laterality Date   AORTIC VALVE REPLACEMENT N/A 05/12/2013   Procedure: AORTIC VALVE REPLACEMENT (AVR);  Surgeon: Maude Fleeta Ochoa, MD;  Location: [facility] OR;  Service: Open Heart Surgery;  Laterality: N/A;   CARDIAC CATHETERIZATION  2011   CARDIOVERSION N/A 04/12/2020   Procedure: CARDIOVERSION;  Surgeon: Maranda Leim DEL, MD;  Location[facility] ENDOSCOPY;  Service: Cardiovascular;  Laterality: N/A;   CORONARY ARTERY BYPASS GRAFT N/A 05/12/2013   Procedure: CORONARY ARTERY BYPASS GRAFTING (CABG);  Surgeon: Maude Fleeta Ochoa, MD;  Location: [facility] OR;  Service: Open Heart  Surgery;  Laterality: N/A;  CABG x 1 using left leg greater saphenous vein harvested endoscopically   INTRAOPERATIVE TRANSESOPHAGEAL ECHOCARDIOGRAM N/A 05/12/2013   Procedure: INTRAOPERATIVE TRANSESOPHAGEAL ECHOCARDIOGRAM;  Surgeon: Maude Fleeta Ochoa, MD;  Location: [facility] OR;  Service: Open Heart Surgery;  Laterality: N/A;   LEFT AND RIGHT HEART CATHETERIZATION WITH CORONARY ANGIOGRAM N/A 05/09/2013   Procedure: LEFT AND RIGHT HEART CATHETERIZATION WITH CORONARY ANGIOGRAM;  Surgeon: Dorn JINNY Lesches, MD;  Location: [facility] CATH LAB;  Service: Cardiovascular;  Laterality: N/A;   MAZE N/A 05/12/2013   Procedure: MAZE;  Surgeon: Maude Fleeta Ochoa, MD;  Location: [facility] OR;  Service: Open Heart Surgery;  Laterality: N/A;   PACEMAKER IMPLANT N/A 05/21/2020   Procedure: PACEMAKER IMPLANT;  Surgeon: Waddell Danelle ORN, MD;  Location: [facility] INVASIVE CV LAB;  Service: Cardiovascular;  Laterality: N/A;   PROSTATECTOMY     TEE WITHOUT CARDIOVERSION N/A 04/12/2020   Procedure: TRANSESOPHAGEAL ECHOCARDIOGRAM (TEE);  Surgeon: Maranda Leim DEL, MD;  Location: [facility] ENDOSCOPY;  Service: Cardiovascular;  Laterality: N/A;   Social History   Occupational History   Not on file  Tobacco Use   Smoking status: Never   Smokeless tobacco: Never  Vaping Use   Vaping status: Never Used  Substance and Sexual Activity   Alcohol use: No   Drug use: No   Sexual activity: Not on file

## 2023-12-01 NOTE — Telephone Encounter (Signed)
 Pt's daughter Donzell called asking for a call back. Pt had an appt today and Rocky stated she was sending referral for home health orders for wound care and stent order to vascular facility and the daughter is asking for phone numbers for both referral to those facilities. Donzell number is [phone].

## 2023-12-02 NOTE — Telephone Encounter (Signed)
 I have talked with the dtr about the vascular referral  I guess we need to get hospital at home to help  I'm looking for their contact info

## 2023-12-03 ENCOUNTER — Telehealth: Payer: Self-pay | Admitting: Orthopedic Surgery

## 2023-12-03 ENCOUNTER — Encounter: Payer: Self-pay | Admitting: Vascular Surgery

## 2023-12-03 ENCOUNTER — Ambulatory Visit: Attending: Vascular Surgery | Admitting: Vascular Surgery

## 2023-12-03 VITALS — BP 114/61 | HR 64 | Temp 98.7°F | Resp 20 | Ht 64.0 in | Wt 144.0 lb

## 2023-12-03 DIAGNOSIS — L97815 Non-pressure chronic ulcer of other part of right lower leg with muscle involvement without evidence of necrosis: Secondary | ICD-10-CM | POA: Insufficient documentation

## 2023-12-03 DIAGNOSIS — M7989 Other specified soft tissue disorders: Secondary | ICD-10-CM | POA: Insufficient documentation

## 2023-12-03 DIAGNOSIS — I872 Venous insufficiency (chronic) (peripheral): Secondary | ICD-10-CM | POA: Diagnosis not present

## 2023-12-03 NOTE — Telephone Encounter (Signed)
 Pt's daughter Donzell called about an update on referral for home health wound care. Please call Donzell with info. Donzell number is [phone].

## 2023-12-03 NOTE — Progress Notes (Signed)
 Office Note     CC: Right leg venous wounds Requesting Provider:  [name], [name]  E, PA  HPI: Robert [name] is a 89 y.o. (1927-06-15) male presenting in follow-up with right leg venous wounds.  On exam, [name] was doing well, accompanied by his daughter.  A native of [place], he has lived there his entire life.  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  I first met guarding in the hospital with right lower extremity venous wounds.  These were infected at the time.  He underwent ABI which demonstrated calcified vessels, but he had a palpable pulse in the foot.  We discussed outpatient follow-up as I did not think an arterial angiogram would provide significant improvement.  [name] had no complaints today.  He stated his wounds have improved since moving to wet-to-dry dressings.  His daughter also feels that the change in the wound management has helped significantly.  He is being seen in the outpatient setting by [name] with plans for possible biologic grafting.  Past Medical History:  Diagnosis Date   Acute CHF (HCC) 05/06/2013   Acute respiratory failure with hypoxia (HCC) 04/24/2023   Aortic stenosis    a. s/p tissue AVR 05/2013;  b. Echo (06/2013):  Mod LVH, EF 60-65%, no RWMA, Gr 2 DD, AVR ok (mean 13 mmHg), MAC, mild MR, mod LAE, mild RAE, PASP 46 mmHg (mild pulmo HTN)   Atrial fibrillation with RVR (HCC) 05/06/2013   Blood loss anemia    a. 05/2009 a/w GIB.   CAD (coronary artery disease)    a. Cath 04/2009: BMS to LCx 04/2009; CTO of RCA with L-R collaterals, 40% ostial diag.   Carpal tunnel syndrome    bilateral, carpal tunnel release x 2   Esophageal ulcer    a. 05/2009 with hemorrhage - injected with epinephrine -Dr. Lamar Buccini.   Esophagitis    a. 05/2009 a/w GIB.   GERD (gastroesophageal reflux disease)    EGD, Dr. Quay 2008   GI bleed    a. 05/2009 as above: with associated anemia. Lower  esophageal ulcer with extensive erosive esophagitis and large clot by EGD 05/2009.    Hx of colonoscopy 2008   Dr. Cindy, polyps- 5 yr f/u recommended   Hyperlipidemia    Hypertension    Influenza A 04/24/2023   PAF (paroxysmal atrial fibrillation) (HCC)    a. Post-op AVR;  b. 04/2014 recurrent.   Perforated ear drum    right ear drum, tube in left ear drum, Dr. Jesus   Peripheral vascular disease    Polymyalgia rheumatica    Prostate cancer [facility])    Vitamin D  deficiency     Past Surgical History:  Procedure Laterality Date   AORTIC VALVE REPLACEMENT N/A 05/12/2013   Procedure: AORTIC VALVE REPLACEMENT (AVR);  Surgeon: Maude Fleeta Ochoa, MD;  Location: [facility] OR;  Service: Open Heart Surgery;  Laterality: N/A;   CARDIAC CATHETERIZATION  2011   CARDIOVERSION N/A 04/12/2020   Procedure: CARDIOVERSION;  Surgeon: Maranda Leim DEL, MD;  Location: [facility] ENDOSCOPY;  Service: Cardiovascular;  Laterality: N/A;   CORONARY ARTERY BYPASS GRAFT N/A 05/12/2013   Procedure: CORONARY ARTERY BYPASS GRAFTING (CABG);  Surgeon: Maude Fleeta Ochoa, MD;  Location: [facility] OR;  Service: Open Heart Surgery;  Laterality: N/A;  CABG x 1 using left leg greater saphenous vein harvested endoscopically   INTRAOPERATIVE TRANSESOPHAGEAL ECHOCARDIOGRAM N/A 05/12/2013   Procedure: INTRAOPERATIVE TRANSESOPHAGEAL ECHOCARDIOGRAM;  Surgeon: Maude  Fleeta Ochoa, MD;  Location: [facility] OR;  Service: Open Heart Surgery;  Laterality: N/A;   LEFT AND RIGHT HEART CATHETERIZATION WITH CORONARY ANGIOGRAM N/A 05/09/2013   Procedure: LEFT AND RIGHT HEART CATHETERIZATION WITH CORONARY ANGIOGRAM;  Surgeon: Dorn JINNY Lesches, MD;  Location: [facility] CATH LAB;  Service: Cardiovascular;  Laterality: N/A;   MAZE N/A 05/12/2013   Procedure: MAZE;  Surgeon: Maude Fleeta Ochoa, MD;  Location: [facility] OR;  Service: Open Heart Surgery;  Laterality: N/A;   PACEMAKER IMPLANT N/A 05/21/2020   Procedure: PACEMAKER IMPLANT;  Surgeon: Waddell Danelle ORN, MD;  Location: [facility] INVASIVE CV LAB;   Service: Cardiovascular;  Laterality: N/A;   PROSTATECTOMY     TEE WITHOUT CARDIOVERSION N/A 04/12/2020   Procedure: TRANSESOPHAGEAL ECHOCARDIOGRAM (TEE);  Surgeon: Maranda Leim DEL, MD;  Location: [facility] ENDOSCOPY;  Service: Cardiovascular;  Laterality: N/A;    Social History   Socioeconomic History   Marital status: Married    Spouse name: Not on file   Number of children: Not on file   Years of education: Not on file   Highest education level: Not on file  Occupational History   Not on file  Tobacco Use   Smoking status: Never   Smokeless tobacco: Never  Vaping Use   Vaping status: Never Used  Substance and Sexual Activity   Alcohol use: No   Drug use: No   Sexual activity: Not on file  Other Topics Concern   Not on file  Social History Narrative   Not on file   Social Drivers of Health   Financial Resource Strain: Not on file  Food Insecurity: No Food Insecurity (11/20/2023)   Hunger Vital Sign    Worried About Running Out of Food in the Last Year: Never true    Ran Out of Food in the Last Year: Never true  Transportation Needs: No Transportation Needs (11/20/2023)   PRAPARE - Administrator, Civil Service (Medical): No    Lack of Transportation (Non-Medical): No  Physical Activity: Not on file  Stress: Not on file  Social Connections: Moderately Isolated (11/20/2023)   Social Connection and Isolation Panel    Frequency of Communication with Friends and Family: More than three times a week    Frequency of Social Gatherings with Friends and Family: More than three times a week    Attends Religious Services: Never    Database administrator or Organizations: No    Attends Banker Meetings: Never    Marital Status: Married  Catering manager Violence: Not At Risk (11/20/2023)   Humiliation, Afraid, Rape, and Kick questionnaire    Fear of Current or Ex-Partner: No    Emotionally Abused: No    Physically Abused: No    Sexually Abused: No    Family History  Problem Relation Age of Onset   CVA Mother    Kidney disease Father    Breast cancer Daughter     Current Outpatient Medications  Medication Sig Dispense Refill   acetaminophen  (TYLENOL ) 325 MG tablet Take 650 mg by mouth every 6 (six) hours as needed for moderate pain.     Ascorbic Acid (VITAMIN C PO) Take 1 tablet by mouth daily.     Cholecalciferol  (DIALYVITE VITAMIN D  5000) 125 MCG (5000 UT) capsule Take 5,000 Units by mouth 2 (two) times a week. Mondays and Fridays     digoxin  (LANOXIN ) 0.125 MG tablet Take 1 tablet (0.125 mg total) by mouth every evening.  furosemide  (LASIX ) 80 MG tablet Take 1 tablet (80 mg total) by mouth 2 (two) times daily. (Patient taking differently: Take 80 mg by mouth 2 (two) times daily as needed for fluid.)     Lactobacillus (PROBIOTIC ACIDOPHILUS) CAPS Take 1 capsule by mouth in the morning and at bedtime for 10 days. 20 capsule 0   leptospermum manuka honey (MEDIHONEY) PSTE paste Apply 1 Application topically daily for 7 days. Interchangeable with TheraHoney  Cleanse trauma wound to right posterior leg with NS and pat dry Apply medihoney to wound bed to facilitate debridement. Cover with dry dressing. Change daily. Apply thin layer (3 mm) to wound. 88 mL 0   levofloxacin (LEVAQUIN) 250 MG tablet Take 2 tablets (500 mg total) by mouth daily for 4 days. 8 tablet 0   linezolid  (ZYVOX ) 600 MG tablet Take 1 tablet (600 mg total) by mouth every 12 (twelve) hours for 7 days.     metolazone  (ZAROXOLYN ) 2.5 MG tablet Take 1 tablet (2.5 mg total) by mouth once a week. (Patient taking differently: Take 2.5 mg by mouth once a week. Mondays) 12 tablet 2   metoprolol  succinate (TOPROL -XL) 50 MG 24 hr tablet Take 1 tablet (50 mg total) by mouth daily. Take with or immediately following a meal. 180 tablet 1   mupirocin  ointment (BACTROBAN ) 2 % Apply 1 Application topically 2 (two) times daily.     Nystatin (GERHARDT'S BUTT CREAM) CREA Apply 1 Application  topically 2 (two) times daily for 10 days. 60 each 0   pantoprazole  (PROTONIX ) 40 MG tablet Take 40 mg by mouth daily.     [Paused] warfarin (COUMADIN ) 5 MG tablet TAKE 1 TABLET DAILY AS DIRECTED BY THE COUMADIN  CLINIC. (Patient taking differently: Take 5 mg by mouth daily.) 100 tablet 3   No current facility-administered medications for this visit.    Allergies  Allergen Reactions   Amoxicillin Swelling    penile swelling   Lisinopril Cough   Kenalog  [Triamcinolone ] Rash     REVIEW OF SYSTEMS:  [X]  denotes positive finding, [ ]  denotes negative finding Cardiac  Comments:  Chest pain or chest pressure:    Shortness of breath upon exertion:    Short of breath when lying flat:    Irregular heart rhythm:        Vascular    Pain in calf, thigh, or hip brought on by ambulation:    Pain in feet at night that wakes you up from your sleep:     Blood clot in your veins:    Leg swelling:         Pulmonary    Oxygen at home:    Productive cough:     Wheezing:         Neurologic    Sudden weakness in arms or legs:     Sudden numbness in arms or legs:     Sudden onset of difficulty speaking or slurred speech:    Temporary loss of vision in one eye:     Problems with dizziness:         Gastrointestinal    Blood in stool:     Vomited blood:         Genitourinary    Burning when urinating:     Blood in urine:        Psychiatric    Major depression:         Hematologic    Bleeding problems:    Problems with blood clotting too easily:  Skin    Rashes or ulcers:        Constitutional    Fever or chills:      PHYSICAL EXAMINATION:  Vitals:   12/03/23 1225  BP: 114/61  Pulse: 64  Resp: 20  Temp: 98.7 F (37.1 C)  TempSrc: Temporal  SpO2: 99%  Weight: 144 lb (65.3 kg)  Height: 5' 4 (1.626 m)    General:  WDWN in NAD; vital signs documented above Gait: Not observed HENT: WNL, normocephalic Pulmonary: normal non-labored breathing , without  wheezing Cardiac: regular HR Abdomen: soft, NT, no masses Skin: with rashes Vascular Exam/Pulses:  Right Left  Radial 2+ (normal) 2+ (normal)  Ulnar    Femoral    Popliteal    DP 2+ (normal) 2+ (normal)  PT     Extremities: without ischemic changes, without Gangrene , without cellulitis; with open wounds;  Musculoskeletal: no muscle wasting or atrophy  Neurologic: A&O X 3;  No focal weakness or paresthesias are detected Psychiatric:  The pt has Normal affect.   Non-Invasive Vascular Imaging:   -    ASSESSMENT/PLAN: SIDNEY KANN is a 89 y.o. male presenting with improving right lower extremity venous wounds.  On physical exam, he had palpable pulses in the foot.  There appeared to be less nonviable tissue, and more granulating tissue below than previous pictures.  Overall, I thought that the wounds appeared very healthy.  I had a long conversation with Crimson and his daughter regarding his wounds, and the plan moving forward.  I think that if the wounds are healing appropriately, there is no reason to move forward with arterial angiography as he has a normal dorsalis pedis pulse in the foot.  Wounds spanned the entirety of the calf, and are not in a single angiosome.  He has an adequate toe pressure for wound healing.   Aser is being seen by [name] in the outpatient setting.  I called [name] to let him know that if he has any healing concerns, I am happy to move forward with angiogram.  At this time, we will continue his conservative therapy.   Fonda FORBES Rim, MD [facility] [phone]

## 2023-12-04 NOTE — Telephone Encounter (Signed)
 Referral submitted for [facility] this morning.

## 2023-12-04 NOTE — Telephone Encounter (Signed)
 Pt's daughter informed that referral to Home health sent to [facility]

## 2023-12-04 NOTE — Addendum Note (Signed)
 Addended by: DESIDERIO LAYMON BROCKS on: 12/04/2023 08:22 AM   Modules accepted: Orders

## 2023-12-04 NOTE — Telephone Encounter (Signed)
 It was just faxed to [facility] right before I called her. It will take a bit for it to process through their system. FYI. Thank you! I will check in on it later today to make sure.

## 2023-12-04 NOTE — Telephone Encounter (Signed)
 Patient's daughter called [facility], they cannot find the referral for him.  Please check on and call her back to advise?  Thanks

## 2023-12-07 MED ORDER — ASPIRIN 81 MG PO TBEC: 81.0000 mg | DELAYED_RELEASE_TABLET | Freq: Every day | ORAL | 3 refills | Status: AC

## 2023-12-07 NOTE — Addendum Note (Signed)
 Addended by: RANDY HAMP SAILOR on: 12/07/2023 05:54 PM   Modules accepted: Orders

## 2023-12-08 ENCOUNTER — Ambulatory Visit: Admitting: Family

## 2023-12-08 DIAGNOSIS — I739 Peripheral vascular disease, unspecified: Secondary | ICD-10-CM

## 2023-12-08 DIAGNOSIS — L97211 Non-pressure chronic ulcer of right calf limited to breakdown of skin: Secondary | ICD-10-CM

## 2023-12-15 ENCOUNTER — Ambulatory Visit: Admitting: Physician Assistant

## 2023-12-15 DIAGNOSIS — L97211 Non-pressure chronic ulcer of right calf limited to breakdown of skin: Secondary | ICD-10-CM

## 2023-12-15 NOTE — Progress Notes (Signed)
 Office Visit Note   Patient: Robert [name]           Date of Birth: [birth year]           MRN: [id number] Visit Date: 12/15/2023              Requested by: [name], [name]  E, PA [address] [place],  [place] [address] PCP: [name], [name]  E, PA  Chief Complaint  Patient presents with   Right Leg - Wound Check      HPI: The patient is a 89 year old gentleman who is seen today in hospitalization follow-up for right lower extremity ulcer with cellulitis he has had recent hospitalization x 2 for the same.  Unfortunately despite hospital at home and close care the ulcer continues to worsen.   Has been in a Unna compression wrap to the lower extremity.  Concern for worsening of the ulcer and new ulcer along the anterior tibia   While hospitalized was seen by vascular as well ABIs were performed.  These were abnormal however at that time it was felt that if the ulcer was healing they would hold off on intervention.  We will monitor the healing.     He was first seen by [name] on 11/21/23 he was placed in Orlovista boot compression.     He was recently seen in our office on 12/01/23 Will begin Vashe to dry dressing changes to be done daily. Provided Allevyn foam to place over the anterior shin.  Pending Abigail study for the anterior wound.    Assessment & Plan: Visit Diagnoses:  1. Non-pressure chronic ulcer of right calf, limited to breakdown of skin (HCC)     Plan: Compression wrap with foam border over the lateral wound.  Elevation to reduce edema and promote healing.  Pending healing he may be changed back to wet to dry and depending Thor study randomization.    Follow-Up Instructions: Return in about 1 week (around 12/22/2023).   Ortho Exam  Patient is alert, oriented, no adenopathy, well-dressed, normal affect, normal respiratory effort. Lateral wound measures 5 cm x 3.8 cm and posterior wound measures 9.5 cm x 13 cm with a depth of 0.5 cm.  Posterior medial  small area o black eschar.   10 blade and 4 x 4 were used to remove yellow fibrinous tissue.  85 % fibrinous with small pin point bleeding after minimal debridement.  He dos not tolerate debridement very well.  Pitting edema in the lower leg.   Imaging: No results found.     Labs: Lab Results  Component Value Date   HGBA1C 5.8 (H) 11/07/2023   HGBA1C 5.7 (H) 05/10/2013   HGBA1C 5.7 (H) 05/06/2013   ESRSEDRATE 63 (H) 11/07/2023   ESRSEDRATE 65 (H) 11/02/2023   CRP 2.8 (H) 11/07/2023   CRP 7.6 (H) 11/02/2023   REPTSTATUS 11/07/2023 FINAL 11/02/2023   GRAMSTAIN  12/28/2007    FEW WBC PRESENT,BOTH PMN AND MONONUCLEAR NO SQUAMOUS EPITHELIAL CELLS SEEN NO ORGANISMS SEEN   GRAMSTAIN  12/28/2007    FEW WBC PRESENT,BOTH PMN AND MONONUCLEAR NO SQUAMOUS EPITHELIAL CELLS SEEN NO ORGANISMS SEEN   CULT  11/02/2023    NO GROWTH 5 DAYS Performed at [facility] Lab, [address], [place], [place] [address]      Lab Results  Component Value Date   ALBUMIN  3.3 (L) 11/20/2023   ALBUMIN  3.5 11/03/2023   ALBUMIN  3.9 11/02/2023   PREALBUMIN 18.3 05/10/2013  Lab Results  Component Value Date   MG 1.8 04/25/2023   MG 1.7 04/24/2023   MG 2.1 05/01/2014   No results found for: [facility]  Lab Results  Component Value Date   PREALBUMIN 18.3 05/10/2013      Latest Ref Rng & Units 11/28/2023   11:59 AM 11/26/2023   10:17 AM 11/25/2023   10:06 AM  CBC EXTENDED  WBC 4.0 - 10.5 K/uL 12.4  14.5  14.5   RBC 4.22 - 5.81 MIL/uL 2.90  3.02  2.94   Hemoglobin 13.0 - 17.0 g/dL 9.1  9.4  9.1   HCT 60.9 - 52.0 % 28.5  29.2  28.3   Platelets 150 - 400 K/uL 330  352  372   NEUT# 1.7 - 7.7 K/uL 6.7     Lymph# 0.7 - 4.0 K/uL 4.4        There is no height or weight on file to calculate BMI.  Orders:  No orders of the defined types were placed in this encounter.  No orders of the defined types were placed in this encounter.    Procedures: No procedures performed  Clinical  Data: No additional findings.  ROS:  All other systems negative, except as noted in the HPI. Review of Systems  Objective: Vital Signs: There were no vitals taken for this visit.  Specialty Comments:  No specialty comments available.  PMFS History: Patient Active Problem List   Diagnosis Date Noted   Skin tear of right upper arm without complication 11/29/2023   Pressure injury of coccygeal region, stage 1 11/27/2023   Wound cellulitis 11/25/2023   DNR (do not resuscitate)/DNI(Do Not Intubate) 11/07/2023   Carcinoma of prostate (HCC) 11/02/2023   Cellulitis of right leg 11/02/2023   S/P CABG x 1 08/12/2023   Chronic combined systolic and diastolic CHF (congestive heart failure) (HCC) 08/12/2023   Subtherapeutic international normalized ratio (INR) 04/24/2023   Anemia of chronic disease 04/24/2023   Pacemaker 09/06/2020   Tachycardia-bradycardia syndrome (HCC) 05/03/2020   Secondary hypercoagulable state 04/04/2020   Mixed conductive and sensorineural hearing loss of both ears 09/14/2018   Encounter for therapeutic drug monitoring 06/02/2014   Chronic anticoagulation 05/17/2014   Atrial flutter (HCC) 06/08/2013   S/P AVR - tissue AVR 05/2013 05/12/2013   Aortic stenosis    Coronary atherosclerosis of native coronary artery 04/21/2013   Essential hypertension 04/21/2013   HLD (hyperlipidemia) 04/21/2013   Permanent atrial fibrillation (HCC) - . Left atrial appendage is clipped. 04/21/2013   GERD (gastroesophageal reflux disease) 04/21/2013   Past Medical History:  Diagnosis Date   Acute CHF (HCC) 05/06/2013   Acute respiratory failure with hypoxia (HCC) 04/24/2023   Aortic stenosis    a. s/p tissue AVR 05/2013;  b. Echo (06/2013):  Mod LVH, EF 60-65%, no RWMA, Gr 2 DD, AVR ok (mean 13 mmHg), MAC, mild MR, mod LAE, mild RAE, PASP 46 mmHg (mild pulmo HTN)   Atrial fibrillation with RVR (HCC) 05/06/2013   Blood loss anemia    a. 05/2009 a/w GIB.   CAD (coronary artery  disease)    a. Cath 04/2009: BMS to LCx 04/2009; CTO of RCA with L-R collaterals, 40% ostial diag.   Carpal tunnel syndrome    bilateral, carpal tunnel release x 2   Esophageal ulcer    a. 05/2009 with hemorrhage - injected with epinephrine -Dr. Lamar Bunk.   Esophagitis    a. 05/2009 a/w GIB.   GERD (gastroesophageal reflux disease)    EGD,  Dr. Quay 2008   GI bleed    a. 05/2009 as above: with associated anemia. Lower esophageal ulcer with extensive erosive esophagitis and large clot by EGD 05/2009.    Hx of colonoscopy 2008   Dr. Cindy, polyps- 5 yr f/u recommended   Hyperlipidemia    Hypertension    Influenza A 04/24/2023   PAF (paroxysmal atrial fibrillation) (HCC)    a. Post-op AVR;  b. 04/2014 recurrent.   Perforated ear drum    right ear drum, tube in left ear drum, Dr. Jesus   Peripheral vascular disease    Polymyalgia rheumatica    Prostate cancer (HCC)    Vitamin D  deficiency     Family History  Problem Relation Age of Onset   CVA Mother    Kidney disease Father    Breast cancer Daughter     Past Surgical History:  Procedure Laterality Date   AORTIC VALVE REPLACEMENT N/A 05/12/2013   Procedure: AORTIC VALVE REPLACEMENT (AVR);  Surgeon: Maude Fleeta Ochoa, MD;  Location: [facility] OR;  Service: Open Heart Surgery;  Laterality: N/A;   CARDIAC CATHETERIZATION  2011   CARDIOVERSION N/A 04/12/2020   Procedure: CARDIOVERSION;  Surgeon: Maranda Leim DEL, MD;  Location: [facility] ENDOSCOPY;  Service: Cardiovascular;  Laterality: N/A;   CORONARY ARTERY BYPASS GRAFT N/A 05/12/2013   Procedure: CORONARY ARTERY BYPASS GRAFTING (CABG);  Surgeon: Maude Fleeta Ochoa, MD;  Location: [facility] OR;  Service: Open Heart Surgery;  Laterality: N/A;  CABG x 1 using left leg greater saphenous vein harvested endoscopically   INTRAOPERATIVE TRANSESOPHAGEAL ECHOCARDIOGRAM N/A 05/12/2013   Procedure: INTRAOPERATIVE TRANSESOPHAGEAL ECHOCARDIOGRAM;  Surgeon: Maude Fleeta Ochoa, MD;  Location: [facility] OR;   Service: Open Heart Surgery;  Laterality: N/A;   LEFT AND RIGHT HEART CATHETERIZATION WITH CORONARY ANGIOGRAM N/A 05/09/2013   Procedure: LEFT AND RIGHT HEART CATHETERIZATION WITH CORONARY ANGIOGRAM;  Surgeon: Dorn JINNY Lesches, MD;  Location: [facility] CATH LAB;  Service: Cardiovascular;  Laterality: N/A;   MAZE N/A 05/12/2013   Procedure: MAZE;  Surgeon: Maude Fleeta Ochoa, MD;  Location: [facility] OR;  Service: Open Heart Surgery;  Laterality: N/A;   PACEMAKER IMPLANT N/A 05/21/2020   Procedure: PACEMAKER IMPLANT;  Surgeon: Waddell Danelle ORN, MD;  Location: [facility] INVASIVE CV LAB;  Service: Cardiovascular;  Laterality: N/A;   PROSTATECTOMY     TEE WITHOUT CARDIOVERSION N/A 04/12/2020   Procedure: TRANSESOPHAGEAL ECHOCARDIOGRAM (TEE);  Surgeon: Maranda Leim DEL, MD;  Location: [facility] ENDOSCOPY;  Service: Cardiovascular;  Laterality: N/A;   Social History   Occupational History   Not on file  Tobacco Use   Smoking status: Never   Smokeless tobacco: Never  Vaping Use   Vaping status: Never Used  Substance and Sexual Activity   Alcohol use: No   Drug use: No   Sexual activity: Not on file

## 2023-12-16 ENCOUNTER — Encounter: Payer: Self-pay | Admitting: Physician Assistant

## 2023-12-17 ENCOUNTER — Ambulatory Visit: Admitting: Orthopedic Surgery

## 2023-12-18 ENCOUNTER — Encounter: Payer: Self-pay | Admitting: Family

## 2023-12-18 ENCOUNTER — Telehealth: Payer: Self-pay | Admitting: Internal Medicine

## 2023-12-18 NOTE — Telephone Encounter (Signed)
 Daughter says patient is off Coumadin  and appointments are no longer needed. She cancelled 10/21 appointment--FYI.

## 2023-12-18 NOTE — Progress Notes (Signed)
 Office Visit Note   Patient: Robert [name]           Date of Birth: [birth year]           MRN: [id number] Visit Date: 12/08/2023              Requested by: [name], [name]  E, PA [address] [place],  [place] [address] PCP: [name], [name]  E, PA  Chief Complaint  Patient presents with   Right Leg - Wound Check      HPI: The patient is a 89 year old gentleman seen in follow-up for significant ulcer to the right lower extremity has been doing Vashe to dry dressing changes for the last 1 week  Assessment & Plan: Visit Diagnoses: No diagnosis found.  Plan to continue with Vashe to dry dressing changes daily Follow-Up Instructions: No follow-ups on file.   Ortho Exam  Patient is alert, oriented, no adenopathy, well-dressed, normal affect, normal respiratory effort. Posterior ulceration measures 10 cm x 5 cm with 5 mm of depth this is filled in with 50% fibrinous exudative tissue.  There is significantly less exudative tissue and eschar today The anterior lateral ulcer is 5 cm x 2-1/2 cm filled in with 75% granulation there is no surrounding erythema or cellulitis      Imaging: No results found. No images are attached to the encounter.  Labs: Lab Results  Component Value Date   HGBA1C 5.8 (H) 11/07/2023   HGBA1C 5.7 (H) 05/10/2013   HGBA1C 5.7 (H) 05/06/2013   ESRSEDRATE 63 (H) 11/07/2023   ESRSEDRATE 65 (H) 11/02/2023   CRP 2.8 (H) 11/07/2023   CRP 7.6 (H) 11/02/2023   REPTSTATUS 11/07/2023 FINAL 11/02/2023   GRAMSTAIN  12/28/2007    FEW WBC PRESENT,BOTH PMN AND MONONUCLEAR NO SQUAMOUS EPITHELIAL CELLS SEEN NO ORGANISMS SEEN   GRAMSTAIN  12/28/2007    FEW WBC PRESENT,BOTH PMN AND MONONUCLEAR NO SQUAMOUS EPITHELIAL CELLS SEEN NO ORGANISMS SEEN   CULT  11/02/2023    NO GROWTH 5 DAYS Performed at [facility] Lab, [address] [place], [place] [address]      Lab Results  Component Value Date   ALBUMIN  3.3 (L) 11/20/2023    ALBUMIN  3.5 11/03/2023   ALBUMIN  3.9 11/02/2023   PREALBUMIN 18.3 05/10/2013    Lab Results  Component Value Date   MG 1.8 04/25/2023   MG 1.7 04/24/2023   MG 2.1 05/01/2014   No results found for: [facility]  Lab Results  Component Value Date   PREALBUMIN 18.3 05/10/2013      Latest Ref Rng & Units 11/28/2023   11:59 AM 11/26/2023   10:17 AM 11/25/2023   10:06 AM  CBC EXTENDED  WBC 4.0 - 10.5 K/uL 12.4  14.5  14.5   RBC 4.22 - 5.81 MIL/uL 2.90  3.02  2.94   Hemoglobin 13.0 - 17.0 g/dL 9.1  9.4  9.1   HCT 60.9 - 52.0 % 28.5  29.2  28.3   Platelets 150 - 400 K/uL 330  352  372   NEUT# 1.7 - 7.7 K/uL 6.7     Lymph# 0.7 - 4.0 K/uL 4.4        There is no height or weight on file to calculate BMI.  Orders:  No orders of the defined types were placed in this encounter.  No orders of the defined types were placed in this encounter.    Procedures: No procedures performed  Clinical Data: No additional findings.  ROS:  All other systems negative, except as noted in the HPI. Review of Systems  Objective: Vital Signs: There were no vitals taken for this visit.  Specialty Comments:  No specialty comments available.  PMFS History: Patient Active Problem List   Diagnosis Date Noted   Skin tear of right upper arm without complication 11/29/2023   Pressure injury of coccygeal region, stage 1 11/27/2023   Wound cellulitis 11/25/2023   DNR (do not resuscitate)/DNI(Do Not Intubate) 11/07/2023   Carcinoma of prostate (HCC) 11/02/2023   Cellulitis of right leg 11/02/2023   S/P CABG x 1 08/12/2023   Chronic combined systolic and diastolic CHF (congestive heart failure) (HCC) 08/12/2023   Subtherapeutic international normalized ratio (INR) 04/24/2023   Anemia of chronic disease 04/24/2023   Pacemaker 09/06/2020   Tachycardia-bradycardia syndrome (HCC) 05/03/2020   Secondary hypercoagulable state 04/04/2020   Mixed conductive and sensorineural hearing loss of both ears  09/14/2018   Encounter for therapeutic drug monitoring 06/02/2014   Chronic anticoagulation 05/17/2014   Atrial flutter (HCC) 06/08/2013   S/P AVR - tissue AVR 05/2013 05/12/2013   Aortic stenosis    Coronary atherosclerosis of native coronary artery 04/21/2013   Essential hypertension 04/21/2013   HLD (hyperlipidemia) 04/21/2013   Permanent atrial fibrillation (HCC) - . Left atrial appendage is clipped. 04/21/2013   GERD (gastroesophageal reflux disease) 04/21/2013   Past Medical History:  Diagnosis Date   Acute CHF (HCC) 05/06/2013   Acute respiratory failure with hypoxia (HCC) 04/24/2023   Aortic stenosis    a. s/p tissue AVR 05/2013;  b. Echo (06/2013):  Mod LVH, EF 60-65%, no RWMA, Gr 2 DD, AVR ok (mean 13 mmHg), MAC, mild MR, mod LAE, mild RAE, PASP 46 mmHg (mild pulmo HTN)   Atrial fibrillation with RVR (HCC) 05/06/2013   Blood loss anemia    a. 05/2009 a/w GIB.   CAD (coronary artery disease)    a. Cath 04/2009: BMS to LCx 04/2009; CTO of RCA with L-R collaterals, 40% ostial diag.   Carpal tunnel syndrome    bilateral, carpal tunnel release x 2   Esophageal ulcer    a. 05/2009 with hemorrhage - injected with epinephrine -Dr. Lamar Bunk.   Esophagitis    a. 05/2009 a/w GIB.   GERD (gastroesophageal reflux disease)    EGD, Dr. Quay 2008   GI bleed    a. 05/2009 as above: with associated anemia. Lower esophageal ulcer with extensive erosive esophagitis and large clot by EGD 05/2009.    Hx of colonoscopy 2008   Dr. Cindy, polyps- 5 yr f/u recommended   Hyperlipidemia    Hypertension    Influenza A 04/24/2023   PAF (paroxysmal atrial fibrillation) (HCC)    a. Post-op AVR;  b. 04/2014 recurrent.   Perforated ear drum    right ear drum, tube in left ear drum, Dr. Jesus   Peripheral vascular disease    Polymyalgia rheumatica    Prostate cancer (HCC)    Vitamin D  deficiency     Family History  Problem Relation Age of Onset   CVA Mother    Kidney disease  Father    Breast cancer Daughter     Past Surgical History:  Procedure Laterality Date   AORTIC VALVE REPLACEMENT N/A 05/12/2013   Procedure: AORTIC VALVE REPLACEMENT (AVR);  Surgeon: Maude Fleeta Ochoa, MD;  Location: [facility] OR;  Service: Open Heart Surgery;  Laterality: N/A;   CARDIAC CATHETERIZATION  2011   CARDIOVERSION N/A 04/12/2020   Procedure: CARDIOVERSION;  Surgeon: Maranda Leim DEL, MD;  Location: [facility] ENDOSCOPY;  Service: Cardiovascular;  Laterality: N/A;   CORONARY ARTERY BYPASS GRAFT N/A 05/12/2013   Procedure: CORONARY ARTERY BYPASS GRAFTING (CABG);  Surgeon: Maude Fleeta Ochoa, MD;  Location: [facility] OR;  Service: Open Heart Surgery;  Laterality: N/A;  CABG x 1 using left leg greater saphenous vein harvested endoscopically   INTRAOPERATIVE TRANSESOPHAGEAL ECHOCARDIOGRAM N/A 05/12/2013   Procedure: INTRAOPERATIVE TRANSESOPHAGEAL ECHOCARDIOGRAM;  Surgeon: Maude Fleeta Ochoa, MD;  Location: [facility] OR;  Service: Open Heart Surgery;  Laterality: N/A;   LEFT AND RIGHT HEART CATHETERIZATION WITH CORONARY ANGIOGRAM N/A 05/09/2013   Procedure: LEFT AND RIGHT HEART CATHETERIZATION WITH CORONARY ANGIOGRAM;  Surgeon: Dorn JINNY Lesches, MD;  Location: [facility] CATH LAB;  Service: Cardiovascular;  Laterality: N/A;   MAZE N/A 05/12/2013   Procedure: MAZE;  Surgeon: Maude Fleeta Ochoa, MD;  Location: [facility] OR;  Service: Open Heart Surgery;  Laterality: N/A;   PACEMAKER IMPLANT N/A 05/21/2020   Procedure: PACEMAKER IMPLANT;  Surgeon: Waddell Danelle ORN, MD;  Location: [facility] INVASIVE CV LAB;  Service: Cardiovascular;  Laterality: N/A;   PROSTATECTOMY     TEE WITHOUT CARDIOVERSION N/A 04/12/2020   Procedure: TRANSESOPHAGEAL ECHOCARDIOGRAM (TEE);  Surgeon: Maranda Leim DEL, MD;  Location: [facility] ENDOSCOPY;  Service: Cardiovascular;  Laterality: N/A;   Social History   Occupational History   Not on file  Tobacco Use   Smoking status: Never   Smokeless tobacco: Never  Vaping Use   Vaping status: Never Used  Substance and Sexual Activity    Alcohol use: No   Drug use: No   Sexual activity: Not on file

## 2023-12-22 ENCOUNTER — Ambulatory Visit (INDEPENDENT_AMBULATORY_CARE_PROVIDER_SITE_OTHER): Admitting: Family

## 2023-12-22 ENCOUNTER — Ambulatory Visit

## 2023-12-22 DIAGNOSIS — I739 Peripheral vascular disease, unspecified: Secondary | ICD-10-CM | POA: Diagnosis not present

## 2023-12-22 DIAGNOSIS — L97211 Non-pressure chronic ulcer of right calf limited to breakdown of skin: Secondary | ICD-10-CM

## 2023-12-25 ENCOUNTER — Other Ambulatory Visit: Payer: Self-pay | Admitting: Physician Assistant

## 2023-12-25 DIAGNOSIS — I480 Paroxysmal atrial fibrillation: Secondary | ICD-10-CM

## 2023-12-25 NOTE — Telephone Encounter (Signed)
 Warfarin discontinued per 11/30/23 patient message by Dr. Santo. Will deny this refill at this time.

## 2023-12-29 ENCOUNTER — Encounter: Payer: Self-pay | Admitting: Physician Assistant

## 2023-12-29 ENCOUNTER — Ambulatory Visit: Admitting: Physician Assistant

## 2023-12-29 DIAGNOSIS — L97211 Non-pressure chronic ulcer of right calf limited to breakdown of skin: Secondary | ICD-10-CM

## 2023-12-29 MED ORDER — HYDROCODONE-ACETAMINOPHEN 5-325 MG PO TABS: 1.0000 | ORAL_TABLET | Freq: Two times a day (BID) | ORAL | 0 refills | Status: DC

## 2023-12-29 NOTE — Progress Notes (Deleted)
 Office Visit Note   Patient: Robert [name]           Date of Birth: 02-Apr-1927           MRN: [id number] Visit Date: 12/29/2023              Requested by: [name], [name]  E, PA [address] [place],  [place] [address] PCP: [name], [name]  E, PA  Chief Complaint  Patient presents with   Right Leg - Wound Check    THOR TV1 (randomization)      HPI: The patient is a 89 year old gentleman who is seen today in hospitalization follow-up for right lower extremity ulcer with cellulitis.    While hospitalized on 11/21/23 he was seen by vascular as well ABIs were performed.  These were abnormal however at that time it was felt that if the ulcer was healing they would hold off on intervention.  We will monitor the healing.   The wounds are currently being treated with Compression wrap with foam border over the lateral wound. Elevation to reduce edema and promote healing. Pending healing he may be changed back to wet to dry and depending Thor study randomization.    Assessment & Plan: Visit Diagnoses: No diagnosis found.  Plan: ***  Follow-Up Instructions: No follow-ups on file.   Ortho Exam  Patient is alert, oriented, no adenopathy, well-dressed, normal affect, normal respiratory effort. ***    Imaging: No results found. No images are attached to the encounter.  Labs: Lab Results  Component Value Date   HGBA1C 5.8 (H) 11/07/2023   HGBA1C 5.7 (H) 05/10/2013   HGBA1C 5.7 (H) 05/06/2013   ESRSEDRATE 63 (H) 11/07/2023   ESRSEDRATE 65 (H) 11/02/2023   CRP 2.8 (H) 11/07/2023   CRP 7.6 (H) 11/02/2023   REPTSTATUS 11/07/2023 FINAL 11/02/2023   GRAMSTAIN  12/28/2007    FEW WBC PRESENT,BOTH PMN AND MONONUCLEAR NO SQUAMOUS EPITHELIAL CELLS SEEN NO ORGANISMS SEEN   GRAMSTAIN  12/28/2007    FEW WBC PRESENT,BOTH PMN AND MONONUCLEAR NO SQUAMOUS EPITHELIAL CELLS SEEN NO ORGANISMS SEEN   CULT  11/02/2023    NO GROWTH 5 DAYS Performed at [facility]  Lab, [address], [place], [place] [address]      Lab Results  Component Value Date   ALBUMIN  3.3 (L) 11/20/2023   ALBUMIN  3.5 11/03/2023   ALBUMIN  3.9 11/02/2023   PREALBUMIN 18.3 05/10/2013    Lab Results  Component Value Date   MG 1.8 04/25/2023   MG 1.7 04/24/2023   MG 2.1 05/01/2014   No results found for: [facility]  Lab Results  Component Value Date   PREALBUMIN 18.3 05/10/2013      Latest Ref Rng & Units 11/28/2023   11:59 AM 11/26/2023   10:17 AM 11/25/2023   10:06 AM  CBC EXTENDED  WBC 4.0 - 10.5 K/uL 12.4  14.5  14.5   RBC 4.22 - 5.81 MIL/uL 2.90  3.02  2.94   Hemoglobin 13.0 - 17.0 g/dL 9.1  9.4  9.1   HCT 60.9 - 52.0 % 28.5  29.2  28.3   Platelets 150 - 400 K/uL 330  352  372   NEUT# 1.7 - 7.7 K/uL 6.7     Lymph# 0.7 - 4.0 K/uL 4.4        There is no height or weight on file to calculate BMI.  Orders:  No orders of the defined types were placed in this encounter.  No orders  of the defined types were placed in this encounter.    Procedures: No procedures performed  Clinical Data: No additional findings.  ROS:  All other systems negative, except as noted in the HPI. Review of Systems  Objective: Vital Signs: There were no vitals taken for this visit.  Specialty Comments:  No specialty comments available.  PMFS History: Patient Active Problem List   Diagnosis Date Noted   Skin tear of right upper arm without complication 11/29/2023   Pressure injury of coccygeal region, stage 1 11/27/2023   Wound cellulitis 11/25/2023   DNR (do not resuscitate)/DNI(Do Not Intubate) 11/07/2023   Carcinoma of prostate (HCC) 11/02/2023   Cellulitis of right leg 11/02/2023   S/P CABG x 1 08/12/2023   Chronic combined systolic and diastolic CHF (congestive heart failure) (HCC) 08/12/2023   Subtherapeutic international normalized ratio (INR) 04/24/2023   Anemia of chronic disease 04/24/2023   Pacemaker 09/06/2020   Tachycardia-bradycardia syndrome (HCC)  05/03/2020   Secondary hypercoagulable state 04/04/2020   Mixed conductive and sensorineural hearing loss of both ears 09/14/2018   Chronic anticoagulation 05/17/2014   Atrial flutter (HCC) 06/08/2013   S/P AVR - tissue AVR 05/2013 05/12/2013   Aortic stenosis    Coronary atherosclerosis of native coronary artery 04/21/2013   Essential hypertension 04/21/2013   HLD (hyperlipidemia) 04/21/2013   Permanent atrial fibrillation (HCC) - . Left atrial appendage is clipped. 04/21/2013   GERD (gastroesophageal reflux disease) 04/21/2013   Past Medical History:  Diagnosis Date   Acute CHF (HCC) 05/06/2013   Acute respiratory failure with hypoxia (HCC) 04/24/2023   Aortic stenosis    a. s/p tissue AVR 05/2013;  b. Echo (06/2013):  Mod LVH, EF 60-65%, no RWMA, Gr 2 DD, AVR ok (mean 13 mmHg), MAC, mild MR, mod LAE, mild RAE, PASP 46 mmHg (mild pulmo HTN)   Atrial fibrillation with RVR (HCC) 05/06/2013   Blood loss anemia    a. 05/2009 a/w GIB.   CAD (coronary artery disease)    a. Cath 04/2009: BMS to LCx 04/2009; CTO of RCA with L-R collaterals, 40% ostial diag.   Carpal tunnel syndrome    bilateral, carpal tunnel release x 2   Esophageal ulcer    a. 05/2009 with hemorrhage - injected with epinephrine -Dr. Lamar Bunk.   Esophagitis    a. 05/2009 a/w GIB.   GERD (gastroesophageal reflux disease)    EGD, Dr. Quay 2008   GI bleed    a. 05/2009 as above: with associated anemia. Lower esophageal ulcer with extensive erosive esophagitis and large clot by EGD 05/2009.    Hx of colonoscopy 2008   Dr. Cindy, polyps- 5 yr f/u recommended   Hyperlipidemia    Hypertension    Influenza A 04/24/2023   PAF (paroxysmal atrial fibrillation) (HCC)    a. Post-op AVR;  b. 04/2014 recurrent.   Perforated ear drum    right ear drum, tube in left ear drum, Dr. Jesus   Peripheral vascular disease    Polymyalgia rheumatica    Prostate cancer (HCC)    Vitamin D  deficiency     Family History   Problem Relation Age of Onset   CVA Mother    Kidney disease Father    Breast cancer Daughter     Past Surgical History:  Procedure Laterality Date   AORTIC VALVE REPLACEMENT N/A 05/12/2013   Procedure: AORTIC VALVE REPLACEMENT (AVR);  Surgeon: Maude Fleeta Ochoa, MD;  Location: [facility] OR;  Service: Open Heart Surgery;  Laterality: N/A;  CARDIAC CATHETERIZATION  2011   CARDIOVERSION N/A 04/12/2020   Procedure: CARDIOVERSION;  Surgeon: Maranda Leim DEL, MD;  Location: [facility] ENDOSCOPY;  Service: Cardiovascular;  Laterality: N/A;   CORONARY ARTERY BYPASS GRAFT N/A 05/12/2013   Procedure: CORONARY ARTERY BYPASS GRAFTING (CABG);  Surgeon: Maude Fleeta Ochoa, MD;  Location: [facility] OR;  Service: Open Heart Surgery;  Laterality: N/A;  CABG x 1 using left leg greater saphenous vein harvested endoscopically   INTRAOPERATIVE TRANSESOPHAGEAL ECHOCARDIOGRAM N/A 05/12/2013   Procedure: INTRAOPERATIVE TRANSESOPHAGEAL ECHOCARDIOGRAM;  Surgeon: Maude Fleeta Ochoa, MD;  Location: [facility] OR;  Service: Open Heart Surgery;  Laterality: N/A;   LEFT AND RIGHT HEART CATHETERIZATION WITH CORONARY ANGIOGRAM N/A 05/09/2013   Procedure: LEFT AND RIGHT HEART CATHETERIZATION WITH CORONARY ANGIOGRAM;  Surgeon: Dorn JINNY Lesches, MD;  Location: [facility] CATH LAB;  Service: Cardiovascular;  Laterality: N/A;   MAZE N/A 05/12/2013   Procedure: MAZE;  Surgeon: Maude Fleeta Ochoa, MD;  Location: [facility] OR;  Service: Open Heart Surgery;  Laterality: N/A;   PACEMAKER IMPLANT N/A 05/21/2020   Procedure: PACEMAKER IMPLANT;  Surgeon: Waddell Danelle ORN, MD;  Location: [facility] INVASIVE CV LAB;  Service: Cardiovascular;  Laterality: N/A;   PROSTATECTOMY     TEE WITHOUT CARDIOVERSION N/A 04/12/2020   Procedure: TRANSESOPHAGEAL ECHOCARDIOGRAM (TEE);  Surgeon: Maranda Leim DEL, MD;  Location: [facility] ENDOSCOPY;  Service: Cardiovascular;  Laterality: N/A;   Social History   Occupational History   Not on file  Tobacco Use   Smoking status: Never   Smokeless tobacco: Never  Vaping Use    Vaping status: Never Used  Substance and Sexual Activity   Alcohol use: No   Drug use: No   Sexual activity: Not on file

## 2023-12-29 NOTE — Progress Notes (Signed)
 Office Visit Note   Patient: Robert [name]           Date of Birth: 14-Aug-1927           MRN: [id number] Visit Date: 12/29/2023              Requested by: [name], [name]  E, PA [address] [place],  [place] [address] PCP: [name], [name]  E, PA  Chief Complaint  Patient presents with   Right Leg - Wound Check    THOR TV1 (randomization)      HPI: 89 y/o male is here with his wife and daughter for wound care follow up.  He has been in compression warps weekly since 11/21/23.  He will be randomized today for the [facility] study.  He has had ABI's and a vascular consult on his lats hospitalization.  They did not recommend intervention at the time as long as the wounds wee healing.    He sits and sleeps in a recliner.  He thinks the recliner decreases the pressure on the right leg wound.  His wife stated he does have increased pain after his visits here for 1-2 days and they have tried PRN tylenol  without improvement in his pain.    Assessment & Plan: Visit Diagnoses:  1. Non-pressure chronic ulcer of right calf, limited to breakdown of skin (HCC)     Plan: We discussed in detail to elevate his legs laying down as much as possible.  The family was given a picture of how to elevate in the supine position.  I sent #30 Vicodin for PRN pain control.  1 q 8 prn after debridement days.  They will try to get him sleeping in the bed instead of the recliner.  He was placed in the standard of care group for the [facility] study and did not receive Kerecis tissue graft.  Dressing was with silver alginate.  combining the gelling and absorbent properties of alginate with the antimicrobial action of silver to create a moist environment while reducing bioburden.    Follow-Up Instructions: Return in about 1 week (around 01/05/2024).   Ortho Exam  Patient is alert, oriented, no adenopathy, well-dressed, normal affect, normal respiratory effort. Lateral anterior wound 5 cm x 2 cm, small island  anterior 0.8 cm x 1 cm.  Posterior wound is the largest measuring 10 cm x 5 cm with a depth of 0.5 cm.  Yellow fibrinous tissue is covering the wounds.  Debridement with 10 blade and 4 x 4 was performed.  He did not tolerate this well.  He has 30 % granulation tissue with pin point bleeding after debridement.    Decreased leg edema with good skin wrinkles, mild edema in the foot.  No ischemic changes to the foot.     Pictures were taken prior to debridement. Imaging: No results found.    Labs: Lab Results  Component Value Date   HGBA1C 5.8 (H) 11/07/2023   HGBA1C 5.7 (H) 05/10/2013   HGBA1C 5.7 (H) 05/06/2013   ESRSEDRATE 63 (H) 11/07/2023   ESRSEDRATE 65 (H) 11/02/2023   CRP 2.8 (H) 11/07/2023   CRP 7.6 (H) 11/02/2023   REPTSTATUS 11/07/2023 FINAL 11/02/2023   GRAMSTAIN  12/28/2007    FEW WBC PRESENT,BOTH PMN AND MONONUCLEAR NO SQUAMOUS EPITHELIAL CELLS SEEN NO ORGANISMS SEEN   GRAMSTAIN  12/28/2007    FEW WBC PRESENT,BOTH PMN AND MONONUCLEAR NO SQUAMOUS EPITHELIAL CELLS SEEN NO ORGANISMS SEEN   CULT  11/02/2023  NO GROWTH 5 DAYS Performed at [facility] Lab, [address], [place], [place] [address]      Lab Results  Component Value Date   ALBUMIN  3.3 (L) 11/20/2023   ALBUMIN  3.5 11/03/2023   ALBUMIN  3.9 11/02/2023   PREALBUMIN 18.3 05/10/2013    Lab Results  Component Value Date   MG 1.8 04/25/2023   MG 1.7 04/24/2023   MG 2.1 05/01/2014   No results found for: [facility]  Lab Results  Component Value Date   PREALBUMIN 18.3 05/10/2013      Latest Ref Rng & Units 11/28/2023   11:59 AM 11/26/2023   10:17 AM 11/25/2023   10:06 AM  CBC EXTENDED  WBC 4.0 - 10.5 K/uL 12.4  14.5  14.5   RBC 4.22 - 5.81 MIL/uL 2.90  3.02  2.94   Hemoglobin 13.0 - 17.0 g/dL 9.1  9.4  9.1   HCT 60.9 - 52.0 % 28.5  29.2  28.3   Platelets 150 - 400 K/uL 330  352  372   NEUT# 1.7 - 7.7 K/uL 6.7     Lymph# 0.7 - 4.0 K/uL 4.4        There is no height or weight on file to  calculate BMI.  Orders:  No orders of the defined types were placed in this encounter.  Meds ordered this encounter  Medications   HYDROcodone -acetaminophen  (NORCO/VICODIN) 5-325 MG tablet    Sig: Take 1 tablet by mouth 2 (two) times daily.    Dispense:  30 tablet    Refill:  0    Supervising Provider:   DUDA, MARCUS V [[id number]]     Procedures: No procedures performed  Clinical Data: No additional findings.  ROS:  All other systems negative, except as noted in the HPI. Review of Systems  Objective: Vital Signs: There were no vitals taken for this visit.  Specialty Comments:  No specialty comments available.  PMFS History: Patient Active Problem List   Diagnosis Date Noted   Skin tear of right upper arm without complication 11/29/2023   Pressure injury of coccygeal region, stage 1 11/27/2023   Wound cellulitis 11/25/2023   DNR (do not resuscitate)/DNI(Do Not Intubate) 11/07/2023   Carcinoma of prostate (HCC) 11/02/2023   Cellulitis of right leg 11/02/2023   S/P CABG x 1 08/12/2023   Chronic combined systolic and diastolic CHF (congestive heart failure) (HCC) 08/12/2023   Subtherapeutic international normalized ratio (INR) 04/24/2023   Anemia of chronic disease 04/24/2023   Pacemaker 09/06/2020   Tachycardia-bradycardia syndrome (HCC) 05/03/2020   Secondary hypercoagulable state 04/04/2020   Mixed conductive and sensorineural hearing loss of both ears 09/14/2018   Chronic anticoagulation 05/17/2014   Atrial flutter (HCC) 06/08/2013   S/P AVR - tissue AVR 05/2013 05/12/2013   Aortic stenosis    Coronary atherosclerosis of native coronary artery 04/21/2013   Essential hypertension 04/21/2013   HLD (hyperlipidemia) 04/21/2013   Permanent atrial fibrillation (HCC) - . Left atrial appendage is clipped. 04/21/2013   GERD (gastroesophageal reflux disease) 04/21/2013   Past Medical History:  Diagnosis Date   Acute CHF (HCC) 05/06/2013   Acute respiratory failure with  hypoxia (HCC) 04/24/2023   Aortic stenosis    a. s/p tissue AVR 05/2013;  b. Echo (06/2013):  Mod LVH, EF 60-65%, no RWMA, Gr 2 DD, AVR ok (mean 13 mmHg), MAC, mild MR, mod LAE, mild RAE, PASP 46 mmHg (mild pulmo HTN)   Atrial fibrillation with RVR (HCC) 05/06/2013   Blood loss  anemia    a. 05/2009 a/w GIB.   CAD (coronary artery disease)    a. Cath 04/2009: BMS to LCx 04/2009; CTO of RCA with L-R collaterals, 40% ostial diag.   Carpal tunnel syndrome    bilateral, carpal tunnel release x 2   Esophageal ulcer    a. 05/2009 with hemorrhage - injected with epinephrine -Dr. Lamar Bunk.   Esophagitis    a. 05/2009 a/w GIB.   GERD (gastroesophageal reflux disease)    EGD, Dr. Quay 2008   GI bleed    a. 05/2009 as above: with associated anemia. Lower esophageal ulcer with extensive erosive esophagitis and large clot by EGD 05/2009.    Hx of colonoscopy 2008   Dr. Cindy, polyps- 5 yr f/u recommended   Hyperlipidemia    Hypertension    Influenza A 04/24/2023   PAF (paroxysmal atrial fibrillation) (HCC)    a. Post-op AVR;  b. 04/2014 recurrent.   Perforated ear drum    right ear drum, tube in left ear drum, Dr. Jesus   Peripheral vascular disease    Polymyalgia rheumatica    Prostate cancer (HCC)    Vitamin D  deficiency     Family History  Problem Relation Age of Onset   CVA Mother    Kidney disease Father    Breast cancer Daughter     Past Surgical History:  Procedure Laterality Date   AORTIC VALVE REPLACEMENT N/A 05/12/2013   Procedure: AORTIC VALVE REPLACEMENT (AVR);  Surgeon: Maude Fleeta Ochoa, MD;  Location: [facility] OR;  Service: Open Heart Surgery;  Laterality: N/A;   CARDIAC CATHETERIZATION  2011   CARDIOVERSION N/A 04/12/2020   Procedure: CARDIOVERSION;  Surgeon: Maranda Leim DEL, MD;  Location: [facility] ENDOSCOPY;  Service: Cardiovascular;  Laterality: N/A;   CORONARY ARTERY BYPASS GRAFT N/A 05/12/2013   Procedure: CORONARY ARTERY BYPASS GRAFTING (CABG);  Surgeon: Maude Fleeta Ochoa, MD;  Location: [facility] OR;  Service: Open Heart Surgery;  Laterality: N/A;  CABG x 1 using left leg greater saphenous vein harvested endoscopically   INTRAOPERATIVE TRANSESOPHAGEAL ECHOCARDIOGRAM N/A 05/12/2013   Procedure: INTRAOPERATIVE TRANSESOPHAGEAL ECHOCARDIOGRAM;  Surgeon: Maude Fleeta Ochoa, MD;  Location: [facility] OR;  Service: Open Heart Surgery;  Laterality: N/A;   LEFT AND RIGHT HEART CATHETERIZATION WITH CORONARY ANGIOGRAM N/A 05/09/2013   Procedure: LEFT AND RIGHT HEART CATHETERIZATION WITH CORONARY ANGIOGRAM;  Surgeon: Dorn JINNY Lesches, MD;  Location: [facility] CATH LAB;  Service: Cardiovascular;  Laterality: N/A;   MAZE N/A 05/12/2013   Procedure: MAZE;  Surgeon: Maude Fleeta Ochoa, MD;  Location: [facility] OR;  Service: Open Heart Surgery;  Laterality: N/A;   PACEMAKER IMPLANT N/A 05/21/2020   Procedure: PACEMAKER IMPLANT;  Surgeon: Waddell Danelle ORN, MD;  Location: [facility] INVASIVE CV LAB;  Service: Cardiovascular;  Laterality: N/A;   PROSTATECTOMY     TEE WITHOUT CARDIOVERSION N/A 04/12/2020   Procedure: TRANSESOPHAGEAL ECHOCARDIOGRAM (TEE);  Surgeon: Maranda Leim DEL, MD;  Location: [facility] ENDOSCOPY;  Service: Cardiovascular;  Laterality: N/A;   Social History   Occupational History   Not on file  Tobacco Use   Smoking status: Never   Smokeless tobacco: Never  Vaping Use   Vaping status: Never Used  Substance and Sexual Activity   Alcohol use: No   Drug use: No   Sexual activity: Not on file

## 2023-12-30 ENCOUNTER — Encounter: Payer: Self-pay | Admitting: Family

## 2023-12-30 NOTE — Progress Notes (Signed)
 Office Visit Note   Patient: Robert [name]           Date of Birth: 1927-07-02           MRN: [id number] Visit Date: 12/22/2023              Requested by: [name], [name]  E, PA [address] [place],  [place] [address] PCP: [name], [name]  E, PA  Chief Complaint  Patient presents with   Right Leg - Follow-up    THOR Study SV2      HPI: The patient is a 89 year old gentleman seen in follow-up for significant ulcer to the right lower extremity has been in a compression wrap with a foam border of the lateral wound for the last 1 week he has been elevating is much as able  Assessment & Plan: Visit Diagnoses: No diagnosis found.  Plan to reapply compression wrap with foam dressing to the right lower extremity today. plan to follow-up in 1 week for randomization.  Follow-Up Instructions: No follow-ups on file.   Ortho Exam  Patient is alert, oriented, no adenopathy, well-dressed, normal affect, normal respiratory effort. Posterior ulceration measures 10 cm x 13 cm with 5 mm of depth this is filled in with 50% fibrinous exudative tissue.  There is significantly less exudative tissue and eschar today The anterior lateral ulcer is 5 cm x 3.5 cm filled in with 75% granulation there is no surrounding erythema or cellulitis  Debrided with Vashe and Debrisoft     Imaging: No results found. No images are attached to the encounter.  Labs: Lab Results  Component Value Date   HGBA1C 5.8 (H) 11/07/2023   HGBA1C 5.7 (H) 05/10/2013   HGBA1C 5.7 (H) 05/06/2013   ESRSEDRATE 63 (H) 11/07/2023   ESRSEDRATE 65 (H) 11/02/2023   CRP 2.8 (H) 11/07/2023   CRP 7.6 (H) 11/02/2023   REPTSTATUS 11/07/2023 FINAL 11/02/2023   GRAMSTAIN  12/28/2007    FEW WBC PRESENT,BOTH PMN AND MONONUCLEAR NO SQUAMOUS EPITHELIAL CELLS SEEN NO ORGANISMS SEEN   GRAMSTAIN  12/28/2007    FEW WBC PRESENT,BOTH PMN AND MONONUCLEAR NO SQUAMOUS EPITHELIAL CELLS SEEN NO ORGANISMS SEEN   CULT   11/02/2023    NO GROWTH 5 DAYS Performed at [facility], [address] [place], [place] [address]      Lab Results  Component Value Date   ALBUMIN  3.3 (L) 11/20/2023   ALBUMIN  3.5 11/03/2023   ALBUMIN  3.9 11/02/2023   PREALBUMIN 18.3 05/10/2013    Lab Results  Component Value Date   MG 1.8 04/25/2023   MG 1.7 04/24/2023   MG 2.1 05/01/2014   No results found for: [facility]  Lab Results  Component Value Date   PREALBUMIN 18.3 05/10/2013      Latest Ref Rng & Units 11/28/2023   11:59 AM 11/26/2023   10:17 AM 11/25/2023   10:06 AM  CBC EXTENDED  WBC 4.0 - 10.5 K/uL 12.4  14.5  14.5   RBC 4.22 - 5.81 MIL/uL 2.90  3.02  2.94   Hemoglobin 13.0 - 17.0 g/dL 9.1  9.4  9.1   HCT 60.9 - 52.0 % 28.5  29.2  28.3   Platelets 150 - 400 K/uL 330  352  372   NEUT# 1.7 - 7.7 K/uL 6.7     Lymph# 0.7 - 4.0 K/uL 4.4        There is no height or weight on file to calculate BMI.  Orders:  No orders of the defined types were placed in this encounter.  No orders of the defined types were placed in this encounter.    Procedures: No procedures performed  Clinical Data: No additional findings.  ROS:  All other systems negative, except as noted in the HPI. Review of Systems  Objective: Vital Signs: There were no vitals taken for this visit.  Specialty Comments:  No specialty comments available.  PMFS History: Patient Active Problem List   Diagnosis Date Noted   Skin tear of right upper arm without complication 11/29/2023   Pressure injury of coccygeal region, stage 1 11/27/2023   Wound cellulitis 11/25/2023   DNR (do not resuscitate)/DNI(Do Not Intubate) 11/07/2023   Carcinoma of prostate (HCC) 11/02/2023   Cellulitis of right leg 11/02/2023   S/P CABG x 1 08/12/2023   Chronic combined systolic and diastolic CHF (congestive heart failure) (HCC) 08/12/2023   Subtherapeutic international normalized ratio (INR) 04/24/2023   Anemia of chronic disease 04/24/2023    Pacemaker 09/06/2020   Tachycardia-bradycardia syndrome (HCC) 05/03/2020   Secondary hypercoagulable state 04/04/2020   Mixed conductive and sensorineural hearing loss of both ears 09/14/2018   Chronic anticoagulation 05/17/2014   Atrial flutter (HCC) 06/08/2013   S/P AVR - tissue AVR 05/2013 05/12/2013   Aortic stenosis    Coronary atherosclerosis of native coronary artery 04/21/2013   Essential hypertension 04/21/2013   HLD (hyperlipidemia) 04/21/2013   Permanent atrial fibrillation (HCC) - . Left atrial appendage is clipped. 04/21/2013   GERD (gastroesophageal reflux disease) 04/21/2013   Past Medical History:  Diagnosis Date   Acute CHF (HCC) 05/06/2013   Acute respiratory failure with hypoxia (HCC) 04/24/2023   Aortic stenosis    a. s/p tissue AVR 05/2013;  b. Echo (06/2013):  Mod LVH, EF 60-65%, no RWMA, Gr 2 DD, AVR ok (mean 13 mmHg), MAC, mild MR, mod LAE, mild RAE, PASP 46 mmHg (mild pulmo HTN)   Atrial fibrillation with RVR (HCC) 05/06/2013   Blood loss anemia    a. 05/2009 a/w GIB.   CAD (coronary artery disease)    a. Cath 04/2009: BMS to LCx 04/2009; CTO of RCA with L-R collaterals, 40% ostial diag.   Carpal tunnel syndrome    bilateral, carpal tunnel release x 2   Esophageal ulcer    a. 05/2009 with hemorrhage - injected with epinephrine -Dr. Lamar Bunk.   Esophagitis    a. 05/2009 a/w GIB.   GERD (gastroesophageal reflux disease)    EGD, Dr. Quay 2008   GI bleed    a. 05/2009 as above: with associated anemia. Lower esophageal ulcer with extensive erosive esophagitis and large clot by EGD 05/2009.    Hx of colonoscopy 2008   Dr. Cindy, polyps- 5 yr f/u recommended   Hyperlipidemia    Hypertension    Influenza A 04/24/2023   PAF (paroxysmal atrial fibrillation) (HCC)    a. Post-op AVR;  b. 04/2014 recurrent.   Perforated ear drum    right ear drum, tube in left ear drum, Dr. Jesus   Peripheral vascular disease    Polymyalgia rheumatica     Prostate cancer (HCC)    Vitamin D  deficiency     Family History  Problem Relation Age of Onset   CVA Mother    Kidney disease Father    Breast cancer Daughter     Past Surgical History:  Procedure Laterality Date   AORTIC VALVE REPLACEMENT N/A 05/12/2013   Procedure: AORTIC VALVE REPLACEMENT (AVR);  Surgeon: Maude Salinas Trigt,  MD;  Location: [facility] OR;  Service: Open Heart Surgery;  Laterality: N/A;   CARDIAC CATHETERIZATION  2011   CARDIOVERSION N/A 04/12/2020   Procedure: CARDIOVERSION;  Surgeon: Maranda Leim DEL, MD;  Location: [facility] ENDOSCOPY;  Service: Cardiovascular;  Laterality: N/A;   CORONARY ARTERY BYPASS GRAFT N/A 05/12/2013   Procedure: CORONARY ARTERY BYPASS GRAFTING (CABG);  Surgeon: Maude Fleeta Ochoa, MD;  Location: [facility] OR;  Service: Open Heart Surgery;  Laterality: N/A;  CABG x 1 using left leg greater saphenous vein harvested endoscopically   INTRAOPERATIVE TRANSESOPHAGEAL ECHOCARDIOGRAM N/A 05/12/2013   Procedure: INTRAOPERATIVE TRANSESOPHAGEAL ECHOCARDIOGRAM;  Surgeon: Maude Fleeta Ochoa, MD;  Location: [facility] OR;  Service: Open Heart Surgery;  Laterality: N/A;   LEFT AND RIGHT HEART CATHETERIZATION WITH CORONARY ANGIOGRAM N/A 05/09/2013   Procedure: LEFT AND RIGHT HEART CATHETERIZATION WITH CORONARY ANGIOGRAM;  Surgeon: Dorn JINNY Lesches, MD;  Location: [facility] CATH LAB;  Service: Cardiovascular;  Laterality: N/A;   MAZE N/A 05/12/2013   Procedure: MAZE;  Surgeon: Maude Fleeta Ochoa, MD;  Location: [facility] OR;  Service: Open Heart Surgery;  Laterality: N/A;   PACEMAKER IMPLANT N/A 05/21/2020   Procedure: PACEMAKER IMPLANT;  Surgeon: Waddell Danelle ORN, MD;  Location: [facility] INVASIVE CV LAB;  Service: Cardiovascular;  Laterality: N/A;   PROSTATECTOMY     TEE WITHOUT CARDIOVERSION N/A 04/12/2020   Procedure: TRANSESOPHAGEAL ECHOCARDIOGRAM (TEE);  Surgeon: Maranda Leim DEL, MD;  Location: [facility] ENDOSCOPY;  Service: Cardiovascular;  Laterality: N/A;   Social History   Occupational History   Not on file  Tobacco  Use   Smoking status: Never   Smokeless tobacco: Never  Vaping Use   Vaping status: Never Used  Substance and Sexual Activity   Alcohol use: No   Drug use: No   Sexual activity: Not on file

## 2024-01-04 ENCOUNTER — Encounter: Payer: Self-pay | Admitting: Radiology

## 2024-01-04 DIAGNOSIS — Z23 Encounter for immunization: Secondary | ICD-10-CM | POA: Diagnosis not present

## 2024-01-05 ENCOUNTER — Encounter: Payer: Self-pay | Admitting: Physician Assistant

## 2024-01-05 ENCOUNTER — Ambulatory Visit: Admitting: Physician Assistant

## 2024-01-05 DIAGNOSIS — L97211 Non-pressure chronic ulcer of right calf limited to breakdown of skin: Secondary | ICD-10-CM

## 2024-01-05 NOTE — Progress Notes (Signed)
 Office Visit Note   Patient: Robert [name]           Date of Birth: 1927/09/02           MRN: [id number] Visit Date: 01/05/2024              Requested by: [name], [name]  E, PA [address] [place],  [place] [address] PCP: [name] Daneil FORBES, PA  Chief Complaint  Patient presents with   Right Leg - Follow-up      HPI: 89 y/o male is here with his wife and daughter for wound care follow up. He has been in compression warps weekly since 11/21/23.  He is in the [place] study placed in the standard group.  Dressing was with silver alginate.  combining the gelling and absorbent properties of alginate with the antimicrobial action of silver to create a moist environment while reducing bioburden.    Assessment & Plan: Visit Diagnoses:  1. Non-pressure chronic ulcer of right calf, limited to breakdown of skin (HCC)     Plan: He was placed in the standard of care group for the [facility] study and did not receive Kerecis tissue graft.  Dressing was with silver alginate.  combining the gelling and absorbent properties of alginate with the antimicrobial action of silver to create a moist environment while reducing bioburden.    Follow-Up Instructions: Return in about 1 week (around 01/12/2024).   Ortho Exam  Patient is alert, oriented, no adenopathy, well-dressed, normal affect, normal respiratory effort. Yellow fibrinous tissue was debrided today producing pin point bleeding.  Posterior wound 3.5 cm x 6 cm, anterior lateral wound measures 4 x 3 cm  and posterior medial wound measures 4 cm x 4cm.  He has SS drainage from the wounds.  No cellulitis and good skin lines with minimal edema.    After verbal consent I debrided the yellow fibrinous tissue with a 10 blade.          Imaging: No results found. No images are attached to the encounter.  Labs: Lab Results  Component Value Date   HGBA1C 5.8 (H) 11/07/2023   HGBA1C 5.7 (H) 05/10/2013   HGBA1C 5.7 (H) 05/06/2013    ESRSEDRATE 63 (H) 11/07/2023   ESRSEDRATE 65 (H) 11/02/2023   CRP 2.8 (H) 11/07/2023   CRP 7.6 (H) 11/02/2023   REPTSTATUS 11/07/2023 FINAL 11/02/2023   GRAMSTAIN  12/28/2007    FEW WBC PRESENT,BOTH PMN AND MONONUCLEAR NO SQUAMOUS EPITHELIAL CELLS SEEN NO ORGANISMS SEEN   GRAMSTAIN  12/28/2007    FEW WBC PRESENT,BOTH PMN AND MONONUCLEAR NO SQUAMOUS EPITHELIAL CELLS SEEN NO ORGANISMS SEEN   CULT  11/02/2023    NO GROWTH 5 DAYS Performed at [facility], [address] [place], [place] [address]      Lab Results  Component Value Date   ALBUMIN  3.3 (L) 11/20/2023   ALBUMIN  3.5 11/03/2023   ALBUMIN  3.9 11/02/2023   PREALBUMIN 18.3 05/10/2013    Lab Results  Component Value Date   MG 1.8 04/25/2023   MG 1.7 04/24/2023   MG 2.1 05/01/2014   No results found for: [facility]  Lab Results  Component Value Date   PREALBUMIN 18.3 05/10/2013      Latest Ref Rng & Units 11/28/2023   11:59 AM 11/26/2023   10:17 AM 11/25/2023   10:06 AM  CBC EXTENDED  WBC 4.0 - 10.5 K/uL 12.4  14.5  14.5   RBC 4.22 - 5.81 MIL/uL 2.90  3.02  2.94   Hemoglobin 13.0 - 17.0 g/dL 9.1  9.4  9.1   HCT 60.9 - 52.0 % 28.5  29.2  28.3   Platelets 150 - 400 K/uL 330  352  372   NEUT# 1.7 - 7.7 K/uL 6.7     Lymph# 0.7 - 4.0 K/uL 4.4        There is no height or weight on file to calculate BMI.  Orders:  No orders of the defined types were placed in this encounter.  No orders of the defined types were placed in this encounter.    Procedures: No procedures performed  Clinical Data: No additional findings.  ROS:  All other systems negative, except as noted in the HPI. Review of Systems  Objective: Vital Signs: There were no vitals taken for this visit.  Specialty Comments:  No specialty comments available.  PMFS History: Patient Active Problem List   Diagnosis Date Noted   Skin tear of right upper arm without complication 11/29/2023   Pressure injury of coccygeal region,  stage 1 11/27/2023   Wound cellulitis 11/25/2023   DNR (do not resuscitate)/DNI(Do Not Intubate) 11/07/2023   Carcinoma of prostate (HCC) 11/02/2023   Cellulitis of right leg 11/02/2023   S/P CABG x 1 08/12/2023   Chronic combined systolic and diastolic CHF (congestive heart failure) (HCC) 08/12/2023   Subtherapeutic international normalized ratio (INR) 04/24/2023   Anemia of chronic disease 04/24/2023   Pacemaker 09/06/2020   Tachycardia-bradycardia syndrome (HCC) 05/03/2020   Secondary hypercoagulable state 04/04/2020   Mixed conductive and sensorineural hearing loss of both ears 09/14/2018   Chronic anticoagulation 05/17/2014   Atrial flutter (HCC) 06/08/2013   S/P AVR - tissue AVR 05/2013 05/12/2013   Aortic stenosis    Coronary atherosclerosis of native coronary artery 04/21/2013   Essential hypertension 04/21/2013   HLD (hyperlipidemia) 04/21/2013   Permanent atrial fibrillation (HCC) - . Left atrial appendage is clipped. 04/21/2013   GERD (gastroesophageal reflux disease) 04/21/2013   Past Medical History:  Diagnosis Date   Acute CHF (HCC) 05/06/2013   Acute respiratory failure with hypoxia (HCC) 04/24/2023   Aortic stenosis    a. s/p tissue AVR 05/2013;  b. Echo (06/2013):  Mod LVH, EF 60-65%, no RWMA, Gr 2 DD, AVR ok (mean 13 mmHg), MAC, mild MR, mod LAE, mild RAE, PASP 46 mmHg (mild pulmo HTN)   Atrial fibrillation with RVR (HCC) 05/06/2013   Blood loss anemia    a. 05/2009 a/w GIB.   CAD (coronary artery disease)    a. Cath 04/2009: BMS to LCx 04/2009; CTO of RCA with L-R collaterals, 40% ostial diag.   Carpal tunnel syndrome    bilateral, carpal tunnel release x 2   Esophageal ulcer    a. 05/2009 with hemorrhage - injected with epinephrine -Dr. Lamar Bunk.   Esophagitis    a. 05/2009 a/w GIB.   GERD (gastroesophageal reflux disease)    EGD, Dr. Quay 2008   GI bleed    a. 05/2009 as above: with associated anemia. Lower esophageal ulcer with extensive  erosive esophagitis and large clot by EGD 05/2009.    Hx of colonoscopy 2008   Dr. Cindy, polyps- 5 yr f/u recommended   Hyperlipidemia    Hypertension    Influenza A 04/24/2023   PAF (paroxysmal atrial fibrillation) (HCC)    a. Post-op AVR;  b. 04/2014 recurrent.   Perforated ear drum    right ear drum, tube in left ear drum, Dr. Jesus  Peripheral vascular disease    Polymyalgia rheumatica    Prostate cancer (HCC)    Vitamin D  deficiency     Family History  Problem Relation Age of Onset   CVA Mother    Kidney disease Father    Breast cancer Daughter     Past Surgical History:  Procedure Laterality Date   AORTIC VALVE REPLACEMENT N/A 05/12/2013   Procedure: AORTIC VALVE REPLACEMENT (AVR);  Surgeon: Maude Fleeta Ochoa, MD;  Location: [facility] OR;  Service: Open Heart Surgery;  Laterality: N/A;   CARDIAC CATHETERIZATION  2011   CARDIOVERSION N/A 04/12/2020   Procedure: CARDIOVERSION;  Surgeon: Maranda Leim DEL, MD;  Location: [facility] ENDOSCOPY;  Service: Cardiovascular;  Laterality: N/A;   CORONARY ARTERY BYPASS GRAFT N/A 05/12/2013   Procedure: CORONARY ARTERY BYPASS GRAFTING (CABG);  Surgeon: Maude Fleeta Ochoa, MD;  Location: [facility] OR;  Service: Open Heart Surgery;  Laterality: N/A;  CABG x 1 using left leg greater saphenous vein harvested endoscopically   INTRAOPERATIVE TRANSESOPHAGEAL ECHOCARDIOGRAM N/A 05/12/2013   Procedure: INTRAOPERATIVE TRANSESOPHAGEAL ECHOCARDIOGRAM;  Surgeon: Maude Fleeta Ochoa, MD;  Location: [facility] OR;  Service: Open Heart Surgery;  Laterality: N/A;   LEFT AND RIGHT HEART CATHETERIZATION WITH CORONARY ANGIOGRAM N/A 05/09/2013   Procedure: LEFT AND RIGHT HEART CATHETERIZATION WITH CORONARY ANGIOGRAM;  Surgeon: Dorn JINNY Lesches, MD;  Location: [facility] CATH LAB;  Service: Cardiovascular;  Laterality: N/A;   MAZE N/A 05/12/2013   Procedure: MAZE;  Surgeon: Maude Fleeta Ochoa, MD;  Location: [facility] OR;  Service: Open Heart Surgery;  Laterality: N/A;   PACEMAKER IMPLANT N/A 05/21/2020    Procedure: PACEMAKER IMPLANT;  Surgeon: Waddell Danelle ORN, MD;  Location: [facility] INVASIVE CV LAB;  Service: Cardiovascular;  Laterality: N/A;   PROSTATECTOMY     TEE WITHOUT CARDIOVERSION N/A 04/12/2020   Procedure: TRANSESOPHAGEAL ECHOCARDIOGRAM (TEE);  Surgeon: Maranda Leim DEL, MD;  Location: [facility] ENDOSCOPY;  Service: Cardiovascular;  Laterality: N/A;   Social History   Occupational History   Not on file  Tobacco Use   Smoking status: Never   Smokeless tobacco: Never  Vaping Use   Vaping status: Never Used  Substance and Sexual Activity   Alcohol use: No   Drug use: No   Sexual activity: Not on file

## 2024-01-11 ENCOUNTER — Encounter: Payer: Self-pay | Admitting: Internal Medicine

## 2024-01-11 MED ORDER — FUROSEMIDE 80 MG PO TABS: 80.0000 mg | ORAL_TABLET | Freq: Two times a day (BID) | ORAL | 1 refills | Status: AC

## 2024-01-12 ENCOUNTER — Ambulatory Visit: Admitting: Physician Assistant

## 2024-01-12 ENCOUNTER — Encounter: Payer: Self-pay | Admitting: Physician Assistant

## 2024-01-12 DIAGNOSIS — L97211 Non-pressure chronic ulcer of right calf limited to breakdown of skin: Secondary | ICD-10-CM

## 2024-01-12 NOTE — Progress Notes (Signed)
 Office Visit Note   Patient: Robert [name]           Date of Birth: 1927-08-01           MRN: [id number] Visit Date: 01/12/2024              Requested by: [name], [name]  E, PA [address] [place],  [place] [address] PCP: [name], [name]  E, PA  Chief Complaint  Patient presents with   Right Leg - Follow-up      HPI: 89 y/o male is here with his wife and daughter for wound care follow up. He has been in compression warps weekly since 11/21/23.  He is in the [place] study placed in the standard group.  Dressing was with silver alginate.  combining the gelling and absorbent properties of alginate with the antimicrobial action of silver to create a moist environment while reducing bioburden.   Assessment & Plan: Visit Diagnoses:  1. Non-pressure chronic ulcer of right calf, limited to breakdown of skin (HCC)     Plan: standard of care group for the [facility] study and did not receive Kerecis tissue graft. Dressing was with silver alginate. combining the gelling and absorbent properties of alginate with the antimicrobial action of silver to create a moist environment while reducing bioburden.   Follow-Up Instructions: Return in about 1 week (around 01/19/2024).   Ortho Exam  Patient is alert, oriented, no adenopathy, well-dressed, normal affect, normal respiratory effort. Good skin lines with decreased edema in the right LE.  Yellow fibrinous tissue was debrided after verbal consent with a 10 blade.  He has pain with debridement.  Posterior wound 3.5 cm x 6 cm, anterior lateral wound measures 4 x 3 cm and posterior medial wound measures 4 cm x 4cm. No change in wound size. No cellulitis.           Imaging: No results found. No images are attached to the encounter.  Labs: Lab Results  Component Value Date   HGBA1C 5.8 (H) 11/07/2023   HGBA1C 5.7 (H) 05/10/2013   HGBA1C 5.7 (H) 05/06/2013   ESRSEDRATE 63 (H) 11/07/2023   ESRSEDRATE 65 (H) 11/02/2023   CRP  2.8 (H) 11/07/2023   CRP 7.6 (H) 11/02/2023   REPTSTATUS 11/07/2023 FINAL 11/02/2023   GRAMSTAIN  12/28/2007    FEW WBC PRESENT,BOTH PMN AND MONONUCLEAR NO SQUAMOUS EPITHELIAL CELLS SEEN NO ORGANISMS SEEN   GRAMSTAIN  12/28/2007    FEW WBC PRESENT,BOTH PMN AND MONONUCLEAR NO SQUAMOUS EPITHELIAL CELLS SEEN NO ORGANISMS SEEN   CULT  11/02/2023    NO GROWTH 5 DAYS Performed at [name], [address], [place], [place] [address]      Lab Results  Component Value Date   ALBUMIN  3.3 (L) 11/20/2023   ALBUMIN  3.5 11/03/2023   ALBUMIN  3.9 11/02/2023   PREALBUMIN 18.3 05/10/2013    Lab Results  Component Value Date   MG 1.8 04/25/2023   MG 1.7 04/24/2023   MG 2.1 05/01/2014   No results found for: [facility]  Lab Results  Component Value Date   PREALBUMIN 18.3 05/10/2013      Latest Ref Rng & Units 11/28/2023   11:59 AM 11/26/2023   10:17 AM 11/25/2023   10:06 AM  CBC EXTENDED  WBC 4.0 - 10.5 K/uL 12.4  14.5  14.5   RBC 4.22 - 5.81 MIL/uL 2.90  3.02  2.94   Hemoglobin 13.0 - 17.0 g/dL 9.1  9.4  9.1  HCT 39.0 - 52.0 % 28.5  29.2  28.3   Platelets 150 - 400 K/uL 330  352  372   NEUT# 1.7 - 7.7 K/uL 6.7     Lymph# 0.7 - 4.0 K/uL 4.4        There is no height or weight on file to calculate BMI.  Orders:  No orders of the defined types were placed in this encounter.  No orders of the defined types were placed in this encounter.    Procedures: No procedures performed  Clinical Data: No additional findings.  ROS:  All other systems negative, except as noted in the HPI. Review of Systems  Objective: Vital Signs: There were no vitals taken for this visit.  Specialty Comments:  No specialty comments available.  PMFS History: Patient Active Problem List   Diagnosis Date Noted   Skin tear of right upper arm without complication 11/29/2023   Pressure injury of coccygeal region, stage 1 11/27/2023   Wound cellulitis 11/25/2023   DNR (do not  resuscitate)/DNI(Do Not Intubate) 11/07/2023   Carcinoma of prostate (HCC) 11/02/2023   Cellulitis of right leg 11/02/2023   S/P CABG x 1 08/12/2023   Chronic combined systolic and diastolic CHF (congestive heart failure) (HCC) 08/12/2023   Subtherapeutic international normalized ratio (INR) 04/24/2023   Anemia of chronic disease 04/24/2023   Pacemaker 09/06/2020   Tachycardia-bradycardia syndrome (HCC) 05/03/2020   Secondary hypercoagulable state 04/04/2020   Mixed conductive and sensorineural hearing loss of both ears 09/14/2018   Chronic anticoagulation 05/17/2014   Atrial flutter (HCC) 06/08/2013   S/P AVR - tissue AVR 05/2013 05/12/2013   Aortic stenosis    Coronary atherosclerosis of native coronary artery 04/21/2013   Essential hypertension 04/21/2013   HLD (hyperlipidemia) 04/21/2013   Permanent atrial fibrillation (HCC) - . Left atrial appendage is clipped. 04/21/2013   GERD (gastroesophageal reflux disease) 04/21/2013   Past Medical History:  Diagnosis Date   Acute CHF (HCC) 05/06/2013   Acute respiratory failure with hypoxia (HCC) 04/24/2023   Aortic stenosis    a. s/p tissue AVR 05/2013;  b. Echo (06/2013):  Mod LVH, EF 60-65%, no RWMA, Gr 2 DD, AVR ok (mean 13 mmHg), MAC, mild MR, mod LAE, mild RAE, PASP 46 mmHg (mild pulmo HTN)   Atrial fibrillation with RVR (HCC) 05/06/2013   Blood loss anemia    a. 05/2009 a/w GIB.   CAD (coronary artery disease)    a. Cath 04/2009: BMS to LCx 04/2009; CTO of RCA with L-R collaterals, 40% ostial diag.   Carpal tunnel syndrome    bilateral, carpal tunnel release x 2   Esophageal ulcer    a. 05/2009 with hemorrhage - injected with epinephrine -Dr. Lamar Bunk.   Esophagitis    a. 05/2009 a/w GIB.   GERD (gastroesophageal reflux disease)    EGD, Dr. Quay 2008   GI bleed    a. 05/2009 as above: with associated anemia. Lower esophageal ulcer with extensive erosive esophagitis and large clot by EGD 05/2009.    Hx of colonoscopy  2008   Dr. Cindy, polyps- 5 yr f/u recommended   Hyperlipidemia    Hypertension    Influenza A 04/24/2023   PAF (paroxysmal atrial fibrillation) (HCC)    a. Post-op AVR;  b. 04/2014 recurrent.   Perforated ear drum    right ear drum, tube in left ear drum, Dr. Jesus   Peripheral vascular disease    Polymyalgia rheumatica    Prostate cancer (HCC)  Vitamin D  deficiency     Family History  Problem Relation Age of Onset   CVA Mother    Kidney disease Father    Breast cancer Daughter     Past Surgical History:  Procedure Laterality Date   AORTIC VALVE REPLACEMENT N/A 05/12/2013   Procedure: AORTIC VALVE REPLACEMENT (AVR);  Surgeon: Maude Fleeta Ochoa, MD;  Location: [facility] OR;  Service: Open Heart Surgery;  Laterality: N/A;   CARDIAC CATHETERIZATION  2011   CARDIOVERSION N/A 04/12/2020   Procedure: CARDIOVERSION;  Surgeon: Maranda Leim DEL, MD;  Location: [facility] ENDOSCOPY;  Service: Cardiovascular;  Laterality: N/A;   CORONARY ARTERY BYPASS GRAFT N/A 05/12/2013   Procedure: CORONARY ARTERY BYPASS GRAFTING (CABG);  Surgeon: Maude Fleeta Ochoa, MD;  Location: [facility] OR;  Service: Open Heart Surgery;  Laterality: N/A;  CABG x 1 using left leg greater saphenous vein harvested endoscopically   INTRAOPERATIVE TRANSESOPHAGEAL ECHOCARDIOGRAM N/A 05/12/2013   Procedure: INTRAOPERATIVE TRANSESOPHAGEAL ECHOCARDIOGRAM;  Surgeon: Maude Fleeta Ochoa, MD;  Location: [facility] OR;  Service: Open Heart Surgery;  Laterality: N/A;   LEFT AND RIGHT HEART CATHETERIZATION WITH CORONARY ANGIOGRAM N/A 05/09/2013   Procedure: LEFT AND RIGHT HEART CATHETERIZATION WITH CORONARY ANGIOGRAM;  Surgeon: Dorn JINNY Lesches, MD;  Location: [facility] CATH LAB;  Service: Cardiovascular;  Laterality: N/A;   MAZE N/A 05/12/2013   Procedure: MAZE;  Surgeon: Maude Fleeta Ochoa, MD;  Location: [facility] OR;  Service: Open Heart Surgery;  Laterality: N/A;   PACEMAKER IMPLANT N/A 05/21/2020   Procedure: PACEMAKER IMPLANT;  Surgeon: Waddell Danelle ORN, MD;  Location: [facility]  INVASIVE CV LAB;  Service: Cardiovascular;  Laterality: N/A;   PROSTATECTOMY     TEE WITHOUT CARDIOVERSION N/A 04/12/2020   Procedure: TRANSESOPHAGEAL ECHOCARDIOGRAM (TEE);  Surgeon: Maranda Leim DEL, MD;  Location: [facility] ENDOSCOPY;  Service: Cardiovascular;  Laterality: N/A;   Social History   Occupational History   Not on file  Tobacco Use   Smoking status: Never   Smokeless tobacco: Never  Vaping Use   Vaping status: Never Used  Substance and Sexual Activity   Alcohol use: No   Drug use: No   Sexual activity: Not on file

## 2024-01-14 ENCOUNTER — Other Ambulatory Visit: Payer: Self-pay

## 2024-01-19 ENCOUNTER — Ambulatory Visit: Admitting: Physician Assistant

## 2024-01-19 ENCOUNTER — Encounter: Payer: Self-pay | Admitting: Physician Assistant

## 2024-01-19 DIAGNOSIS — L97211 Non-pressure chronic ulcer of right calf limited to breakdown of skin: Secondary | ICD-10-CM

## 2024-01-19 DIAGNOSIS — I739 Peripheral vascular disease, unspecified: Secondary | ICD-10-CM

## 2024-01-19 NOTE — Progress Notes (Signed)
 Office Visit Note   Patient: Robert [name]           Date of Birth: 11-25-1927           MRN: [id number] Visit Date: 01/19/2024              Requested by: [name], [name]  E, PA [address] [place],  [place] [address] PCP: [name], [name]  E, PA  Chief Complaint  Patient presents with   Right Leg - Wound Check    TV4      HPI: 89 y/o male is here with his wife and daughter for wound care follow up. He has been in compression warps weekly since 11/21/23.  He is in the [place] study placed in the standard group.  Dressing was with silver alginate.  combining the gelling and absorbent properties of alginate with the antimicrobial action of silver to create a moist environment while reducing bioburden.   Assessment & Plan: Visit Diagnoses:  1. Non-pressure chronic ulcer of right calf, limited to breakdown of skin (HCC)   2. PAD (peripheral artery disease)     Plan: Doppler signals monophasic DP/PT.  No foot wounds.  His foot is warm to touch.  The leg wounds do not appear to be healing.  I will order ABI's to  re evaluate his blood flow.  We will place him in a compression dressing, elevation.  No silver in the dressing today.  The wounds appeared to have yellow fibrinous tissue build up.    Follow-Up Instructions: Return in about 1 week (around 01/26/2024).   Ortho Exam  Patient is alert, oriented, no adenopathy, well-dressed, normal affect, normal respiratory effort. There is a new posterior proximal wound on the calf measures 3 cm x 3 cm.  Medial leg 3.5 x 5 cm, and 4 x 3 cm and posterior medial wound.  The wound bed on the anterior had significant yellow fibrinous tissue.  The wound beds were debrided with Vashe and 4 x 4 to remove yellow fibrinous tissue.        Imaging: No results found.    Labs: Lab Results  Component Value Date   HGBA1C 5.8 (H) 11/07/2023   HGBA1C 5.7 (H) 05/10/2013   HGBA1C 5.7 (H) 05/06/2013   ESRSEDRATE 63 (H) 11/07/2023    ESRSEDRATE 65 (H) 11/02/2023   CRP 2.8 (H) 11/07/2023   CRP 7.6 (H) 11/02/2023   REPTSTATUS 11/07/2023 FINAL 11/02/2023   GRAMSTAIN  12/28/2007    FEW WBC PRESENT,BOTH PMN AND MONONUCLEAR NO SQUAMOUS EPITHELIAL CELLS SEEN NO ORGANISMS SEEN   GRAMSTAIN  12/28/2007    FEW WBC PRESENT,BOTH PMN AND MONONUCLEAR NO SQUAMOUS EPITHELIAL CELLS SEEN NO ORGANISMS SEEN   CULT  11/02/2023    NO GROWTH 5 DAYS Performed at [facility], [address] [place], [place] [address]      Lab Results  Component Value Date   ALBUMIN  3.3 (L) 11/20/2023   ALBUMIN  3.5 11/03/2023   ALBUMIN  3.9 11/02/2023   PREALBUMIN 18.3 05/10/2013    Lab Results  Component Value Date   MG 1.8 04/25/2023   MG 1.7 04/24/2023   MG 2.1 05/01/2014   No results found for: [facility]  Lab Results  Component Value Date   PREALBUMIN 18.3 05/10/2013      Latest Ref Rng & Units 11/28/2023   11:59 AM 11/26/2023   10:17 AM 11/25/2023   10:06 AM  CBC EXTENDED  WBC 4.0 - 10.5 K/uL 12.4  14.5  14.5   RBC 4.22 - 5.81 MIL/uL 2.90  3.02  2.94   Hemoglobin 13.0 - 17.0 g/dL 9.1  9.4  9.1   HCT 60.9 - 52.0 % 28.5  29.2  28.3   Platelets 150 - 400 K/uL 330  352  372   NEUT# 1.7 - 7.7 K/uL 6.7     Lymph# 0.7 - 4.0 K/uL 4.4        There is no height or weight on file to calculate BMI.  Orders:  No orders of the defined types were placed in this encounter.  No orders of the defined types were placed in this encounter.    Procedures: No procedures performed  Clinical Data: No additional findings.  ROS:  All other systems negative, except as noted in the HPI. Review of Systems  Objective: Vital Signs: There were no vitals taken for this visit.  Specialty Comments:  No specialty comments available.  PMFS History: Patient Active Problem List   Diagnosis Date Noted   Skin tear of right upper arm without complication 11/29/2023   Pressure injury of coccygeal region, stage 1 11/27/2023   Wound  cellulitis 11/25/2023   DNR (do not resuscitate)/DNI(Do Not Intubate) 11/07/2023   Carcinoma of prostate (HCC) 11/02/2023   Cellulitis of right leg 11/02/2023   S/P CABG x 1 08/12/2023   Chronic combined systolic and diastolic CHF (congestive heart failure) (HCC) 08/12/2023   Subtherapeutic international normalized ratio (INR) 04/24/2023   Anemia of chronic disease 04/24/2023   Pacemaker 09/06/2020   Tachycardia-bradycardia syndrome (HCC) 05/03/2020   Secondary hypercoagulable state 04/04/2020   Mixed conductive and sensorineural hearing loss of both ears 09/14/2018   Chronic anticoagulation 05/17/2014   Atrial flutter (HCC) 06/08/2013   S/P AVR - tissue AVR 05/2013 05/12/2013   Aortic stenosis    Coronary atherosclerosis of native coronary artery 04/21/2013   Essential hypertension 04/21/2013   HLD (hyperlipidemia) 04/21/2013   Permanent atrial fibrillation (HCC) - . Left atrial appendage is clipped. 04/21/2013   GERD (gastroesophageal reflux disease) 04/21/2013   Past Medical History:  Diagnosis Date   Acute CHF (HCC) 05/06/2013   Acute respiratory failure with hypoxia (HCC) 04/24/2023   Aortic stenosis    a. s/p tissue AVR 05/2013;  b. Echo (06/2013):  Mod LVH, EF 60-65%, no RWMA, Gr 2 DD, AVR ok (mean 13 mmHg), MAC, mild MR, mod LAE, mild RAE, PASP 46 mmHg (mild pulmo HTN)   Atrial fibrillation with RVR (HCC) 05/06/2013   Blood loss anemia    a. 05/2009 a/w GIB.   CAD (coronary artery disease)    a. Cath 04/2009: BMS to LCx 04/2009; CTO of RCA with L-R collaterals, 40% ostial diag.   Carpal tunnel syndrome    bilateral, carpal tunnel release x 2   Esophageal ulcer    a. 05/2009 with hemorrhage - injected with epinephrine -Dr. Lamar Bunk.   Esophagitis    a. 05/2009 a/w GIB.   GERD (gastroesophageal reflux disease)    EGD, Dr. Quay 2008   GI bleed    a. 05/2009 as above: with associated anemia. Lower esophageal ulcer with extensive erosive esophagitis and large clot  by EGD 05/2009.    Hx of colonoscopy 2008   Dr. Cindy, polyps- 5 yr f/u recommended   Hyperlipidemia    Hypertension    Influenza A 04/24/2023   PAF (paroxysmal atrial fibrillation) (HCC)    a. Post-op AVR;  b. 04/2014 recurrent.   Perforated ear drum  right ear drum, tube in left ear drum, Dr. Jesus   Peripheral vascular disease    Polymyalgia rheumatica    Prostate cancer (HCC)    Vitamin D  deficiency     Family History  Problem Relation Age of Onset   CVA Mother    Kidney disease Father    Breast cancer Daughter     Past Surgical History:  Procedure Laterality Date   AORTIC VALVE REPLACEMENT N/A 05/12/2013   Procedure: AORTIC VALVE REPLACEMENT (AVR);  Surgeon: Maude Fleeta Ochoa, MD;  Location: [facility] OR;  Service: Open Heart Surgery;  Laterality: N/A;   CARDIAC CATHETERIZATION  2011   CARDIOVERSION N/A 04/12/2020   Procedure: CARDIOVERSION;  Surgeon: Maranda Leim DEL, MD;  Location: [facility] ENDOSCOPY;  Service: Cardiovascular;  Laterality: N/A;   CORONARY ARTERY BYPASS GRAFT N/A 05/12/2013   Procedure: CORONARY ARTERY BYPASS GRAFTING (CABG);  Surgeon: Maude Fleeta Ochoa, MD;  Location: [facility] OR;  Service: Open Heart Surgery;  Laterality: N/A;  CABG x 1 using left leg greater saphenous vein harvested endoscopically   INTRAOPERATIVE TRANSESOPHAGEAL ECHOCARDIOGRAM N/A 05/12/2013   Procedure: INTRAOPERATIVE TRANSESOPHAGEAL ECHOCARDIOGRAM;  Surgeon: Maude Fleeta Ochoa, MD;  Location: [facility] OR;  Service: Open Heart Surgery;  Laterality: N/A;   LEFT AND RIGHT HEART CATHETERIZATION WITH CORONARY ANGIOGRAM N/A 05/09/2013   Procedure: LEFT AND RIGHT HEART CATHETERIZATION WITH CORONARY ANGIOGRAM;  Surgeon: Dorn JINNY Lesches, MD;  Location: [facility] CATH LAB;  Service: Cardiovascular;  Laterality: N/A;   MAZE N/A 05/12/2013   Procedure: MAZE;  Surgeon: Maude Fleeta Ochoa, MD;  Location: [facility] OR;  Service: Open Heart Surgery;  Laterality: N/A;   PACEMAKER IMPLANT N/A 05/21/2020   Procedure: PACEMAKER IMPLANT;  Surgeon:  Waddell Danelle ORN, MD;  Location: [facility] INVASIVE CV LAB;  Service: Cardiovascular;  Laterality: N/A;   PROSTATECTOMY     TEE WITHOUT CARDIOVERSION N/A 04/12/2020   Procedure: TRANSESOPHAGEAL ECHOCARDIOGRAM (TEE);  Surgeon: Maranda Leim DEL, MD;  Location: [facility] ENDOSCOPY;  Service: Cardiovascular;  Laterality: N/A;   Social History   Occupational History   Not on file  Tobacco Use   Smoking status: Never   Smokeless tobacco: Never  Vaping Use   Vaping status: Never Used  Substance and Sexual Activity   Alcohol use: No   Drug use: No   Sexual activity: Not on file

## 2024-01-21 ENCOUNTER — Telehealth: Payer: Self-pay | Admitting: Physician Assistant

## 2024-01-21 NOTE — Telephone Encounter (Signed)
 Patient called and said that he needs [facility] to [facility] and that they never got it. CB#[phone]

## 2024-01-21 NOTE — Telephone Encounter (Signed)
 Patient daughter Donzell called and said that her dad can not hear like that and for you to call her if needed. [phone]

## 2024-01-21 NOTE — Telephone Encounter (Signed)
 Attached this message to previous one I just got.

## 2024-01-21 NOTE — Telephone Encounter (Signed)
 Nesmith, Natalie NN   01/21/24  1:20 PM Note Patient daughter Donzell called and said that her dad can not hear like that and for you to call her if needed. [phone]

## 2024-01-21 NOTE — Telephone Encounter (Signed)
 [facility] put in referral for ABI's to check blood flow.

## 2024-01-21 NOTE — Addendum Note (Signed)
 Addended by: GEROME MAURILIO HERO on: 01/21/2024 01:45 PM   Modules accepted: Orders

## 2024-01-25 ENCOUNTER — Other Ambulatory Visit: Payer: Self-pay

## 2024-01-25 NOTE — Telephone Encounter (Signed)
 [facility] mail order pharmacy requesting clarification on which strength of furosemide  pt is taking 40 mg or 809 mg? Please address   Inv# [id number]    Ph# [phone]

## 2024-01-26 ENCOUNTER — Ambulatory Visit (HOSPITAL_COMMUNITY)
Admission: RE | Admit: 2024-01-26 | Discharge: 2024-01-26 | Disposition: A | Discharge status: Home / Self Care | Source: Ambulatory Visit | Attending: Physician Assistant | Admitting: Physician Assistant

## 2024-01-26 ENCOUNTER — Ambulatory Visit: Admitting: Physician Assistant

## 2024-01-26 DIAGNOSIS — L97211 Non-pressure chronic ulcer of right calf limited to breakdown of skin: Secondary | ICD-10-CM | POA: Diagnosis present

## 2024-01-26 DIAGNOSIS — I739 Peripheral vascular disease, unspecified: Secondary | ICD-10-CM | POA: Insufficient documentation

## 2024-01-26 LAB — VAS US ABI WITH/WO TBI

## 2024-01-26 NOTE — Progress Notes (Signed)
 Office Visit Note   Patient: Robert [name]           Date of Birth: November 19, 1927           MRN: [id number] Visit Date: 01/26/2024              Requested by: [name], [name]  E, PA [address] [place],  [place] [address] PCP: [name], [name]  E, PA  Chief Complaint  Patient presents with   Right Leg - Wound Check      HPI: 89 y/o male is here with his wife and daughter for wound care follow up. He has been in compression warps weekly since 11/21/23. He is in the [place] study placed in the standard group.   Assessment & Plan: Visit Diagnoses: No diagnosis found.  Plan: right LE leg wounds are healthier appearing with less sloth like tissue and healthy skin borders.  No cellulitis or drainage.  We will not reapply silver alginate for the second week.  He was placed back in a compression dressing.  Elevation multiple times a day.  Follow-Up Instructions: Return in about 1 week (around 02/02/2024).   Ortho Exam  Patient is alert, oriented, no adenopathy, well-dressed, normal affect, normal respiratory effort. Improved appearance of the wound beds with mixed granulation and yellow fibrinous tissue.  After verbal consent the wound beds were debrided with a 10 blade and 4 x 4 guaze.  The wounds measured proximally 2.5 x 3 cm, small skin island then 4 cm x 1.5 cm.  One distally 1 cm x 1 cm.  The medial wound was 5 cm x 3.8 cm and 1.5 cm x 1.7 cm.    He was seen by vascula for repeat ABI's  ABI/TBIToday's ABIToday's TBIPrevious ABIPrevious TBI  +-------+-----------+-----------+------------+------------+  Right [place]         0.64       [place]          0.49          +-------+-----------+-----------+------------+------------+  Left  [place]         0.76                 0.47          +-------+-----------+-----------+------------+------------+  His ABI's are stable and his TBI's have Improved.              Imaging: No results found. No images are  attached to the encounter.  Labs: Lab Results  Component Value Date   HGBA1C 5.8 (H) 11/07/2023   HGBA1C 5.7 (H) 05/10/2013   HGBA1C 5.7 (H) 05/06/2013   ESRSEDRATE 63 (H) 11/07/2023   ESRSEDRATE 65 (H) 11/02/2023   CRP 2.8 (H) 11/07/2023   CRP 7.6 (H) 11/02/2023   REPTSTATUS 11/07/2023 FINAL 11/02/2023   GRAMSTAIN  12/28/2007    FEW WBC PRESENT,BOTH PMN AND MONONUCLEAR NO SQUAMOUS EPITHELIAL CELLS SEEN NO ORGANISMS SEEN   GRAMSTAIN  12/28/2007    FEW WBC PRESENT,BOTH PMN AND MONONUCLEAR NO SQUAMOUS EPITHELIAL CELLS SEEN NO ORGANISMS SEEN   CULT  11/02/2023    NO GROWTH 5 DAYS Performed at [facility], [address], [place], [place] [address]      Lab Results  Component Value Date   ALBUMIN  3.3 (L) 11/20/2023   ALBUMIN  3.5 11/03/2023   ALBUMIN  3.9 11/02/2023   PREALBUMIN 18.3 05/10/2013    Lab Results  Component Value Date   MG 1.8 04/25/2023   MG 1.7 04/24/2023   MG 2.1  05/01/2014   No results found for: [facility]  Lab Results  Component Value Date   PREALBUMIN 18.3 05/10/2013      Latest Ref Rng & Units 11/28/2023   11:59 AM 11/26/2023   10:17 AM 11/25/2023   10:06 AM  CBC EXTENDED  WBC 4.0 - 10.5 K/uL 12.4  14.5  14.5   RBC 4.22 - 5.81 MIL/uL 2.90  3.02  2.94   Hemoglobin 13.0 - 17.0 g/dL 9.1  9.4  9.1   HCT 60.9 - 52.0 % 28.5  29.2  28.3   Platelets 150 - 400 K/uL 330  352  372   NEUT# 1.7 - 7.7 K/uL 6.7     Lymph# 0.7 - 4.0 K/uL 4.4        There is no height or weight on file to calculate BMI.  Orders:  No orders of the defined types were placed in this encounter.  No orders of the defined types were placed in this encounter.    Procedures: No procedures performed  Clinical Data: No additional findings.  ROS:  All other systems negative, except as noted in the HPI. Review of Systems  Objective: Vital Signs: There were no vitals taken for this visit.  Specialty Comments:  No specialty comments available.  PMFS  History: Patient Active Problem List   Diagnosis Date Noted   Skin tear of right upper arm without complication 11/29/2023   Pressure injury of coccygeal region, stage 1 11/27/2023   Wound cellulitis 11/25/2023   DNR (do not resuscitate)/DNI(Do Not Intubate) 11/07/2023   Carcinoma of prostate (HCC) 11/02/2023   Cellulitis of right leg 11/02/2023   S/P CABG x 1 08/12/2023   Chronic combined systolic and diastolic CHF (congestive heart failure) (HCC) 08/12/2023   Subtherapeutic international normalized ratio (INR) 04/24/2023   Anemia of chronic disease 04/24/2023   Pacemaker 09/06/2020   Tachycardia-bradycardia syndrome (HCC) 05/03/2020   Secondary hypercoagulable state 04/04/2020   Mixed conductive and sensorineural hearing loss of both ears 09/14/2018   Chronic anticoagulation 05/17/2014   Atrial flutter (HCC) 06/08/2013   S/P AVR - tissue AVR 05/2013 05/12/2013   Aortic stenosis    Coronary atherosclerosis of native coronary artery 04/21/2013   Essential hypertension 04/21/2013   HLD (hyperlipidemia) 04/21/2013   Permanent atrial fibrillation (HCC) - . Left atrial appendage is clipped. 04/21/2013   GERD (gastroesophageal reflux disease) 04/21/2013   Past Medical History:  Diagnosis Date   Acute CHF (HCC) 05/06/2013   Acute respiratory failure with hypoxia (HCC) 04/24/2023   Aortic stenosis    a. s/p tissue AVR 05/2013;  b. Echo (06/2013):  Mod LVH, EF 60-65%, no RWMA, Gr 2 DD, AVR ok (mean 13 mmHg), MAC, mild MR, mod LAE, mild RAE, PASP 46 mmHg (mild pulmo HTN)   Atrial fibrillation with RVR (HCC) 05/06/2013   Blood loss anemia    a. 05/2009 a/w GIB.   CAD (coronary artery disease)    a. Cath 04/2009: BMS to LCx 04/2009; CTO of RCA with L-R collaterals, 40% ostial diag.   Carpal tunnel syndrome    bilateral, carpal tunnel release x 2   Esophageal ulcer    a. 05/2009 with hemorrhage - injected with epinephrine -Dr. Lamar Bunk.   Esophagitis    a. 05/2009 a/w GIB.   GERD  (gastroesophageal reflux disease)    EGD, Dr. Quay 2008   GI bleed    a. 05/2009 as above: with associated anemia. Lower esophageal ulcer with extensive erosive esophagitis and large clot by  EGD 05/2009.    Hx of colonoscopy 2008   Dr. Cindy, polyps- 5 yr f/u recommended   Hyperlipidemia    Hypertension    Influenza A 04/24/2023   PAF (paroxysmal atrial fibrillation) (HCC)    a. Post-op AVR;  b. 04/2014 recurrent.   Perforated ear drum    right ear drum, tube in left ear drum, Dr. Jesus   Peripheral vascular disease    Polymyalgia rheumatica    Prostate cancer (HCC)    Vitamin D  deficiency     Family History  Problem Relation Age of Onset   CVA Mother    Kidney disease Father    Breast cancer Daughter     Past Surgical History:  Procedure Laterality Date   AORTIC VALVE REPLACEMENT N/A 05/12/2013   Procedure: AORTIC VALVE REPLACEMENT (AVR);  Surgeon: Maude Fleeta Ochoa, MD;  Location: [facility] OR;  Service: Open Heart Surgery;  Laterality: N/A;   CARDIAC CATHETERIZATION  2011   CARDIOVERSION N/A 04/12/2020   Procedure: CARDIOVERSION;  Surgeon: Maranda Leim DEL, MD;  Location: [facility] ENDOSCOPY;  Service: Cardiovascular;  Laterality: N/A;   CORONARY ARTERY BYPASS GRAFT N/A 05/12/2013   Procedure: CORONARY ARTERY BYPASS GRAFTING (CABG);  Surgeon: Maude Fleeta Ochoa, MD;  Location: [facility] OR;  Service: Open Heart Surgery;  Laterality: N/A;  CABG x 1 using left leg greater saphenous vein harvested endoscopically   INTRAOPERATIVE TRANSESOPHAGEAL ECHOCARDIOGRAM N/A 05/12/2013   Procedure: INTRAOPERATIVE TRANSESOPHAGEAL ECHOCARDIOGRAM;  Surgeon: Maude Fleeta Ochoa, MD;  Location: [facility] OR;  Service: Open Heart Surgery;  Laterality: N/A;   LEFT AND RIGHT HEART CATHETERIZATION WITH CORONARY ANGIOGRAM N/A 05/09/2013   Procedure: LEFT AND RIGHT HEART CATHETERIZATION WITH CORONARY ANGIOGRAM;  Surgeon: Dorn JINNY Lesches, MD;  Location: [facility] CATH LAB;  Service: Cardiovascular;  Laterality: N/A;   MAZE N/A  05/12/2013   Procedure: MAZE;  Surgeon: Maude Fleeta Ochoa, MD;  Location: [facility] OR;  Service: Open Heart Surgery;  Laterality: N/A;   PACEMAKER IMPLANT N/A 05/21/2020   Procedure: PACEMAKER IMPLANT;  Surgeon: Waddell Danelle ORN, MD;  Location: [facility] INVASIVE CV LAB;  Service: Cardiovascular;  Laterality: N/A;   PROSTATECTOMY     TEE WITHOUT CARDIOVERSION N/A 04/12/2020   Procedure: TRANSESOPHAGEAL ECHOCARDIOGRAM (TEE);  Surgeon: Maranda Leim DEL, MD;  Location: [facility] ENDOSCOPY;  Service: Cardiovascular;  Laterality: N/A;   Social History   Occupational History   Not on file  Tobacco Use   Smoking status: Never   Smokeless tobacco: Never  Vaping Use   Vaping status: Never Used  Substance and Sexual Activity   Alcohol use: No   Drug use: No   Sexual activity: Not on file

## 2024-01-29 ENCOUNTER — Other Ambulatory Visit (HOSPITAL_COMMUNITY): Payer: Self-pay

## 2024-01-31 ENCOUNTER — Encounter: Payer: Self-pay | Admitting: Physician Assistant

## 2024-02-02 ENCOUNTER — Ambulatory Visit: Admitting: Physician Assistant

## 2024-02-02 ENCOUNTER — Encounter: Payer: Self-pay | Admitting: Physician Assistant

## 2024-02-02 ENCOUNTER — Other Ambulatory Visit (HOSPITAL_BASED_OUTPATIENT_CLINIC_OR_DEPARTMENT_OTHER): Payer: Self-pay

## 2024-02-02 ENCOUNTER — Other Ambulatory Visit (HOSPITAL_COMMUNITY): Payer: Self-pay

## 2024-02-02 DIAGNOSIS — L97211 Non-pressure chronic ulcer of right calf limited to breakdown of skin: Secondary | ICD-10-CM

## 2024-02-02 NOTE — Progress Notes (Signed)
 Office Visit Note   Patient: Robert [name]           Date of Birth: [birth year]           MRN: [id number] Visit Date: 02/02/2024              Requested by: [name] Vara FORBES, PA [address] [place],  [place] [address] PCP: [name], [name]  E, PA  Chief Complaint  Patient presents with   Right Leg - Wound Check    THOR [id number]      HPI: 89 y/o male is here with his wife and daughter for wound care follow up. He has been in compression warps weekly since 11/21/23. He is in the [place] study placed in the standard group.  He is doing ADLs easier and feels like he is getting a little stronger on a daily basis.   Assessment & Plan: Visit Diagnoses:  1. Non-pressure chronic ulcer of right calf, limited to breakdown of skin (HCC)     Plan: No cellulitis or drainage. He was placed in compression wrap and will follow up in 1 week for exam and debridement.    Follow-Up Instructions: Return in about 1 week (around 02/09/2024).   Ortho Exam  Patient is alert, oriented, no adenopathy, well-dressed, normal affect, normal respiratory effort. Multiple wounds with yellow fibrinous tissue.  After verbal consent a 10 blade was used to debride the yellow tissue.  50% granulation tissue grossly after debridement.  He tolerated the debridement a little better today.   The wounds measured proximally 2.5 x 3 cm, small skin island then 4 cm x 1.5 cm. One distally 1 cm x 1 cm. The medial wound was 5 cm x 3.8 cm and 1.5 cm x 1.7 cm. Positive decrease in the edema of his right LE.  He was seen by vascula for repeat ABI's  ABI/TBIToday's ABIToday's TBIPrevious ABIPrevious TBI  +-------+-----------+-----------+------------+------------+  Right [place]         0.64       [place]          0.49          +-------+-----------+-----------+------------+------------+  Left  [place]         0.76       [place]          0.47          +-------+-----------+-----------+------------+------------+  His  ABI's are stable and his TBI's have Improved.                    Imaging: No results found. No images are attached to the encounter.  Labs: Lab Results  Component Value Date   HGBA1C 5.8 (H) 11/07/2023   HGBA1C 5.7 (H) 05/10/2013   HGBA1C 5.7 (H) 05/06/2013   ESRSEDRATE 63 (H) 11/07/2023   ESRSEDRATE 65 (H) 11/02/2023   CRP 2.8 (H) 11/07/2023   CRP 7.6 (H) 11/02/2023   REPTSTATUS 11/07/2023 FINAL 11/02/2023   GRAMSTAIN  12/28/2007    FEW WBC PRESENT,BOTH PMN AND MONONUCLEAR NO SQUAMOUS EPITHELIAL CELLS SEEN NO ORGANISMS SEEN   GRAMSTAIN  12/28/2007    FEW WBC PRESENT,BOTH PMN AND MONONUCLEAR NO SQUAMOUS EPITHELIAL CELLS SEEN NO ORGANISMS SEEN   CULT  11/02/2023    NO GROWTH 5 DAYS Performed at [facility], [address], [place], [place] [address]      Lab Results  Component Value Date   ALBUMIN  3.3 (L) 11/20/2023   ALBUMIN  3.5 11/03/2023  ALBUMIN  3.9 11/02/2023   PREALBUMIN 18.3 05/10/2013    Lab Results  Component Value Date   MG 1.8 04/25/2023   MG 1.7 04/24/2023   MG 2.1 05/01/2014   No results found for: [facility]  Lab Results  Component Value Date   PREALBUMIN 18.3 05/10/2013      Latest Ref Rng & Units 11/28/2023   11:59 AM 11/26/2023   10:17 AM 11/25/2023   10:06 AM  CBC EXTENDED  WBC 4.0 - 10.5 K/uL 12.4  14.5  14.5   RBC 4.22 - 5.81 MIL/uL 2.90  3.02  2.94   Hemoglobin 13.0 - 17.0 g/dL 9.1  9.4  9.1   HCT 60.9 - 52.0 % 28.5  29.2  28.3   Platelets 150 - 400 K/uL 330  352  372   NEUT# 1.7 - 7.7 K/uL 6.7     Lymph# 0.7 - 4.0 K/uL 4.4        There is no height or weight on file to calculate BMI.  Orders:  No orders of the defined types were placed in this encounter.  No orders of the defined types were placed in this encounter.    Procedures: No procedures performed  Clinical Data: No additional findings.  ROS:  All other systems negative, except as noted in the HPI. Review of  Systems  Objective: Vital Signs: There were no vitals taken for this visit.  Specialty Comments:  No specialty comments available.  PMFS History: Patient Active Problem List   Diagnosis Date Noted   Skin tear of right upper arm without complication 11/29/2023   Pressure injury of coccygeal region, stage 1 11/27/2023   Wound cellulitis 11/25/2023   DNR (do not resuscitate)/DNI(Do Not Intubate) 11/07/2023   Carcinoma of prostate (HCC) 11/02/2023   Cellulitis of right leg 11/02/2023   S/P CABG x 1 08/12/2023   Chronic combined systolic and diastolic CHF (congestive heart failure) (HCC) 08/12/2023   Subtherapeutic international normalized ratio (INR) 04/24/2023   Anemia of chronic disease 04/24/2023   Pacemaker 09/06/2020   Tachycardia-bradycardia syndrome (HCC) 05/03/2020   Secondary hypercoagulable state 04/04/2020   Mixed conductive and sensorineural hearing loss of both ears 09/14/2018   Chronic anticoagulation 05/17/2014   Atrial flutter (HCC) 06/08/2013   S/P AVR - tissue AVR 05/2013 05/12/2013   Aortic stenosis    Coronary atherosclerosis of native coronary artery 04/21/2013   Essential hypertension 04/21/2013   HLD (hyperlipidemia) 04/21/2013   Permanent atrial fibrillation (HCC) - . Left atrial appendage is clipped. 04/21/2013   GERD (gastroesophageal reflux disease) 04/21/2013   Past Medical History:  Diagnosis Date   Acute CHF (HCC) 05/06/2013   Acute respiratory failure with hypoxia (HCC) 04/24/2023   Aortic stenosis    a. s/p tissue AVR 05/2013;  b. Echo (06/2013):  Mod LVH, EF 60-65%, no RWMA, Gr 2 DD, AVR ok (mean 13 mmHg), MAC, mild MR, mod LAE, mild RAE, PASP 46 mmHg (mild pulmo HTN)   Atrial fibrillation with RVR (HCC) 05/06/2013   Blood loss anemia    a. 05/2009 a/w GIB.   CAD (coronary artery disease)    a. Cath 04/2009: BMS to LCx 04/2009; CTO of RCA with L-R collaterals, 40% ostial diag.   Carpal tunnel syndrome    bilateral, carpal tunnel release x 2    Esophageal ulcer    a. 05/2009 with hemorrhage - injected with epinephrine -Dr. Lamar Bunk.   Esophagitis    a. 05/2009 a/w GIB.   GERD (gastroesophageal reflux disease)  EGD, Dr. Quay 2008   GI bleed    a. 05/2009 as above: with associated anemia. Lower esophageal ulcer with extensive erosive esophagitis and large clot by EGD 05/2009.    Hx of colonoscopy 2008   Dr. Cindy, polyps- 5 yr f/u recommended   Hyperlipidemia    Hypertension    Influenza A 04/24/2023   PAF (paroxysmal atrial fibrillation) (HCC)    a. Post-op AVR;  b. 04/2014 recurrent.   Perforated ear drum    right ear drum, tube in left ear drum, Dr. Jesus   Peripheral vascular disease    Polymyalgia rheumatica    Prostate cancer (HCC)    Vitamin D  deficiency     Family History  Problem Relation Age of Onset   CVA Mother    Kidney disease Father    Breast cancer Daughter     Past Surgical History:  Procedure Laterality Date   AORTIC VALVE REPLACEMENT N/A 05/12/2013   Procedure: AORTIC VALVE REPLACEMENT (AVR);  Surgeon: Maude Fleeta Ochoa, MD;  Location: [facility] OR;  Service: Open Heart Surgery;  Laterality: N/A;   CARDIAC CATHETERIZATION  2011   CARDIOVERSION N/A 04/12/2020   Procedure: CARDIOVERSION;  Surgeon: Maranda Leim DEL, MD;  Location: [facility] ENDOSCOPY;  Service: Cardiovascular;  Laterality: N/A;   CORONARY ARTERY BYPASS GRAFT N/A 05/12/2013   Procedure: CORONARY ARTERY BYPASS GRAFTING (CABG);  Surgeon: Maude Fleeta Ochoa, MD;  Location: [facility] OR;  Service: Open Heart Surgery;  Laterality: N/A;  CABG x 1 using left leg greater saphenous vein harvested endoscopically   INTRAOPERATIVE TRANSESOPHAGEAL ECHOCARDIOGRAM N/A 05/12/2013   Procedure: INTRAOPERATIVE TRANSESOPHAGEAL ECHOCARDIOGRAM;  Surgeon: Maude Fleeta Ochoa, MD;  Location: [facility] OR;  Service: Open Heart Surgery;  Laterality: N/A;   LEFT AND RIGHT HEART CATHETERIZATION WITH CORONARY ANGIOGRAM N/A 05/09/2013   Procedure: LEFT AND RIGHT HEART CATHETERIZATION  WITH CORONARY ANGIOGRAM;  Surgeon: Dorn JINNY Lesches, MD;  Location: [facility] CATH LAB;  Service: Cardiovascular;  Laterality: N/A;   MAZE N/A 05/12/2013   Procedure: MAZE;  Surgeon: Maude Fleeta Ochoa, MD;  Location: [facility] OR;  Service: Open Heart Surgery;  Laterality: N/A;   PACEMAKER IMPLANT N/A 05/21/2020   Procedure: PACEMAKER IMPLANT;  Surgeon: Waddell Danelle ORN, MD;  Location: [facility] INVASIVE CV LAB;  Service: Cardiovascular;  Laterality: N/A;   PROSTATECTOMY     TEE WITHOUT CARDIOVERSION N/A 04/12/2020   Procedure: TRANSESOPHAGEAL ECHOCARDIOGRAM (TEE);  Surgeon: Maranda Leim DEL, MD;  Location: [facility] ENDOSCOPY;  Service: Cardiovascular;  Laterality: N/A;   Social History   Occupational History   Not on file  Tobacco Use   Smoking status: Never   Smokeless tobacco: Never  Vaping Use   Vaping status: Never Used  Substance and Sexual Activity   Alcohol use: No   Drug use: No   Sexual activity: Not on file

## 2024-02-08 ENCOUNTER — Other Ambulatory Visit (HOSPITAL_COMMUNITY): Payer: Self-pay

## 2024-02-08 DIAGNOSIS — Z1331 Encounter for screening for depression: Secondary | ICD-10-CM | POA: Diagnosis not present

## 2024-02-08 DIAGNOSIS — I2510 Atherosclerotic heart disease of native coronary artery without angina pectoris: Secondary | ICD-10-CM | POA: Diagnosis not present

## 2024-02-08 DIAGNOSIS — Z8546 Personal history of malignant neoplasm of prostate: Secondary | ICD-10-CM | POA: Diagnosis not present

## 2024-02-08 DIAGNOSIS — Z0000 Encounter for general adult medical examination without abnormal findings: Secondary | ICD-10-CM | POA: Diagnosis not present

## 2024-02-08 DIAGNOSIS — D500 Iron deficiency anemia secondary to blood loss (chronic): Secondary | ICD-10-CM | POA: Diagnosis not present

## 2024-02-08 DIAGNOSIS — E785 Hyperlipidemia, unspecified: Secondary | ICD-10-CM | POA: Diagnosis not present

## 2024-02-08 DIAGNOSIS — Z952 Presence of prosthetic heart valve: Secondary | ICD-10-CM | POA: Diagnosis not present

## 2024-02-08 DIAGNOSIS — R54 Age-related physical debility: Secondary | ICD-10-CM | POA: Diagnosis not present

## 2024-02-08 DIAGNOSIS — C9110 Chronic lymphocytic leukemia of B-cell type not having achieved remission: Secondary | ICD-10-CM | POA: Diagnosis not present

## 2024-02-08 DIAGNOSIS — M353 Polymyalgia rheumatica: Secondary | ICD-10-CM | POA: Diagnosis not present

## 2024-02-08 DIAGNOSIS — K219 Gastro-esophageal reflux disease without esophagitis: Secondary | ICD-10-CM | POA: Diagnosis not present

## 2024-02-08 DIAGNOSIS — I4891 Unspecified atrial fibrillation: Secondary | ICD-10-CM | POA: Diagnosis not present

## 2024-02-08 DIAGNOSIS — H906 Mixed conductive and sensorineural hearing loss, bilateral: Secondary | ICD-10-CM | POA: Diagnosis not present

## 2024-02-08 DIAGNOSIS — I10 Essential (primary) hypertension: Secondary | ICD-10-CM | POA: Diagnosis not present

## 2024-02-08 DIAGNOSIS — I5042 Chronic combined systolic (congestive) and diastolic (congestive) heart failure: Secondary | ICD-10-CM | POA: Diagnosis not present

## 2024-02-09 ENCOUNTER — Encounter: Payer: Self-pay | Admitting: Physician Assistant

## 2024-02-09 ENCOUNTER — Ambulatory Visit: Admitting: Physician Assistant

## 2024-02-09 DIAGNOSIS — L97211 Non-pressure chronic ulcer of right calf limited to breakdown of skin: Secondary | ICD-10-CM

## 2024-02-09 NOTE — Progress Notes (Unsigned)
 Office Visit Note   Patient: Robert [name]           Date of Birth: [birth year]           MRN: [id number] Visit Date: 02/09/2024              Requested by: [name] Durand FORBES, PA [address] [place],  [place] [address] PCP: [name], [name]  E, PA  Chief Complaint  Patient presents with   Right Leg - Wound Check    THOR V7      HPI: 89 y/o male is here with his wife and daughter for wound care follow up. He has been in compression warps weekly since 11/21/23. He is in the [place] study placed in the standard group.  He has been managed with compression wraps.  They stated the wrap gets tight and they end up cutting the top of it to get it to be a little looser.  He admits he is not elevating above his heart as much as he should.    Assessment & Plan: Visit Diagnoses:  1. Non-pressure chronic ulcer of right calf, limited to breakdown of skin (HCC)     Plan: He continues to have edema.  We discussed again that he should lay down and elevate his legs above his heart.  I suggested that instead of cutting the dressing he should elevate his legs to prevent it from being too tight.    No cellulitis or drainage. He was placed in compression wrap and will follow up in 1 week for exam and debridement.    Follow-Up Instructions: Return in about 1 week (around 02/16/2024).   Ortho Exam  Patient is alert, oriented, no adenopathy, well-dressed, normal affect, normal respiratory effort. Good skin lines in the central lower leg with edema above and below where the dressing was.   The wounds measured proximally 3.5 cm x 1.7 cm, small skin island then 2 cm x 1.5 cm. One distally 1 cm x 1 cm. The medial wound was 5 cm x 3.8 cm and 1.5 cm x 1.7 cm. Positive decrease in the edema of his right LE.   Wound with yellow fibrinous tissue.   After verbal consent the wound beds were debrided with a 10 blade.  Pin point bleeding occurred on all wound beds.  Before and after pictures were  taken.                                Imaging: No results found. No images are attached to the encounter.  Labs: Lab Results  Component Value Date   HGBA1C 5.8 (H) 11/07/2023   HGBA1C 5.7 (H) 05/10/2013   HGBA1C 5.7 (H) 05/06/2013   ESRSEDRATE 63 (H) 11/07/2023   ESRSEDRATE 65 (H) 11/02/2023   CRP 2.8 (H) 11/07/2023   CRP 7.6 (H) 11/02/2023   REPTSTATUS 11/07/2023 FINAL 11/02/2023   GRAMSTAIN  12/28/2007    FEW WBC PRESENT,BOTH PMN AND MONONUCLEAR NO SQUAMOUS EPITHELIAL CELLS SEEN NO ORGANISMS SEEN   GRAMSTAIN  12/28/2007    FEW WBC PRESENT,BOTH PMN AND MONONUCLEAR NO SQUAMOUS EPITHELIAL CELLS SEEN NO ORGANISMS SEEN   CULT  11/02/2023    NO GROWTH 5 DAYS Performed at [facility], [address], [place], [place] [address]      Lab Results  Component Value Date   ALBUMIN  3.3 (L) 11/20/2023   ALBUMIN  3.5 11/03/2023   ALBUMIN   3.9 11/02/2023   PREALBUMIN 18.3 05/10/2013    Lab Results  Component Value Date   MG 1.8 04/25/2023   MG 1.7 04/24/2023   MG 2.1 05/01/2014   No results found for: [facility]  Lab Results  Component Value Date   PREALBUMIN 18.3 05/10/2013      Latest Ref Rng & Units 11/28/2023   11:59 AM 11/26/2023   10:17 AM 11/25/2023   10:06 AM  CBC EXTENDED  WBC 4.0 - 10.5 K/uL 12.4  14.5  14.5   RBC 4.22 - 5.81 MIL/uL 2.90  3.02  2.94   Hemoglobin 13.0 - 17.0 g/dL 9.1  9.4  9.1   HCT 60.9 - 52.0 % 28.5  29.2  28.3   Platelets 150 - 400 K/uL 330  352  372   NEUT# 1.7 - 7.7 K/uL 6.7     Lymph# 0.7 - 4.0 K/uL 4.4        There is no height or weight on file to calculate BMI.  Orders:  No orders of the defined types were placed in this encounter.  No orders of the defined types were placed in this encounter.    Procedures: No procedures performed  Clinical Data: No additional findings.  ROS:  All other systems negative, except as noted in the HPI. Review of Systems  Objective: Vital Signs: There  were no vitals taken for this visit.  Specialty Comments:  No specialty comments available.  PMFS History: Patient Active Problem List   Diagnosis Date Noted   Skin tear of right upper arm without complication 11/29/2023   Pressure injury of coccygeal region, stage 1 11/27/2023   Wound cellulitis 11/25/2023   DNR (do not resuscitate)/DNI(Do Not Intubate) 11/07/2023   Carcinoma of prostate (HCC) 11/02/2023   Cellulitis of right leg 11/02/2023   S/P CABG x 1 08/12/2023   Chronic combined systolic and diastolic CHF (congestive heart failure) (HCC) 08/12/2023   Subtherapeutic international normalized ratio (INR) 04/24/2023   Anemia of chronic disease 04/24/2023   Pacemaker 09/06/2020   Tachycardia-bradycardia syndrome (HCC) 05/03/2020   Secondary hypercoagulable state 04/04/2020   Mixed conductive and sensorineural hearing loss of both ears 09/14/2018   Chronic anticoagulation 05/17/2014   Atrial flutter (HCC) 06/08/2013   S/P AVR - tissue AVR 05/2013 05/12/2013   Aortic stenosis    Coronary atherosclerosis of native coronary artery 04/21/2013   Essential hypertension 04/21/2013   HLD (hyperlipidemia) 04/21/2013   Permanent atrial fibrillation (HCC) - . Left atrial appendage is clipped. 04/21/2013   GERD (gastroesophageal reflux disease) 04/21/2013   Past Medical History:  Diagnosis Date   Acute CHF (HCC) 05/06/2013   Acute respiratory failure with hypoxia (HCC) 04/24/2023   Aortic stenosis    a. s/p tissue AVR 05/2013;  b. Echo (06/2013):  Mod LVH, EF 60-65%, no RWMA, Gr 2 DD, AVR ok (mean 13 mmHg), MAC, mild MR, mod LAE, mild RAE, PASP 46 mmHg (mild pulmo HTN)   Atrial fibrillation with RVR (HCC) 05/06/2013   Blood loss anemia    a. 05/2009 a/w GIB.   CAD (coronary artery disease)    a. Cath 04/2009: BMS to LCx 04/2009; CTO of RCA with L-R collaterals, 40% ostial diag.   Carpal tunnel syndrome    bilateral, carpal tunnel release x 2   Esophageal ulcer    a. 05/2009 with  hemorrhage - injected with epinephrine -Dr. Lamar Bunk.   Esophagitis    a. 05/2009 a/w GIB.   GERD (gastroesophageal reflux disease)  EGD, Dr. Quay 2008   GI bleed    a. 05/2009 as above: with associated anemia. Lower esophageal ulcer with extensive erosive esophagitis and large clot by EGD 05/2009.    Hx of colonoscopy 2008   Dr. Cindy, polyps- 5 yr f/u recommended   Hyperlipidemia    Hypertension    Influenza A 04/24/2023   PAF (paroxysmal atrial fibrillation) (HCC)    a. Post-op AVR;  b. 04/2014 recurrent.   Perforated ear drum    right ear drum, tube in left ear drum, Dr. Jesus   Peripheral vascular disease    Polymyalgia rheumatica    Prostate cancer (HCC)    Vitamin D  deficiency     Family History  Problem Relation Age of Onset   CVA Mother    Kidney disease Father    Breast cancer Daughter     Past Surgical History:  Procedure Laterality Date   AORTIC VALVE REPLACEMENT N/A 05/12/2013   Procedure: AORTIC VALVE REPLACEMENT (AVR);  Surgeon: Maude Fleeta Ochoa, MD;  Location: [facility] OR;  Service: Open Heart Surgery;  Laterality: N/A;   CARDIAC CATHETERIZATION  2011   CARDIOVERSION N/A 04/12/2020   Procedure: CARDIOVERSION;  Surgeon: Maranda Leim DEL, MD;  Location: [facility] ENDOSCOPY;  Service: Cardiovascular;  Laterality: N/A;   CORONARY ARTERY BYPASS GRAFT N/A 05/12/2013   Procedure: CORONARY ARTERY BYPASS GRAFTING (CABG);  Surgeon: Maude Fleeta Ochoa, MD;  Location: [facility] OR;  Service: Open Heart Surgery;  Laterality: N/A;  CABG x 1 using left leg greater saphenous vein harvested endoscopically   INTRAOPERATIVE TRANSESOPHAGEAL ECHOCARDIOGRAM N/A 05/12/2013   Procedure: INTRAOPERATIVE TRANSESOPHAGEAL ECHOCARDIOGRAM;  Surgeon: Maude Fleeta Ochoa, MD;  Location: [facility] OR;  Service: Open Heart Surgery;  Laterality: N/A;   LEFT AND RIGHT HEART CATHETERIZATION WITH CORONARY ANGIOGRAM N/A 05/09/2013   Procedure: LEFT AND RIGHT HEART CATHETERIZATION WITH CORONARY ANGIOGRAM;  Surgeon:  Dorn JINNY Lesches, MD;  Location: [facility] CATH LAB;  Service: Cardiovascular;  Laterality: N/A;   MAZE N/A 05/12/2013   Procedure: MAZE;  Surgeon: Maude Fleeta Ochoa, MD;  Location: [facility] OR;  Service: Open Heart Surgery;  Laterality: N/A;   PACEMAKER IMPLANT N/A 05/21/2020   Procedure: PACEMAKER IMPLANT;  Surgeon: Waddell Danelle ORN, MD;  Location: [facility] INVASIVE CV LAB;  Service: Cardiovascular;  Laterality: N/A;   PROSTATECTOMY     TEE WITHOUT CARDIOVERSION N/A 04/12/2020   Procedure: TRANSESOPHAGEAL ECHOCARDIOGRAM (TEE);  Surgeon: Maranda Leim DEL, MD;  Location: [facility] ENDOSCOPY;  Service: Cardiovascular;  Laterality: N/A;   Social History   Occupational History   Not on file  Tobacco Use   Smoking status: Never   Smokeless tobacco: Never  Vaping Use   Vaping status: Never Used  Substance and Sexual Activity   Alcohol use: No   Drug use: No   Sexual activity: Not on file

## 2024-02-10 DIAGNOSIS — C44529 Squamous cell carcinoma of skin of other part of trunk: Secondary | ICD-10-CM | POA: Diagnosis not present

## 2024-02-10 DIAGNOSIS — L905 Scar conditions and fibrosis of skin: Secondary | ICD-10-CM | POA: Diagnosis not present

## 2024-02-10 DIAGNOSIS — Z85828 Personal history of other malignant neoplasm of skin: Secondary | ICD-10-CM | POA: Diagnosis not present

## 2024-02-10 DIAGNOSIS — C44622 Squamous cell carcinoma of skin of right upper limb, including shoulder: Secondary | ICD-10-CM | POA: Diagnosis not present

## 2024-02-10 DIAGNOSIS — D485 Neoplasm of uncertain behavior of skin: Secondary | ICD-10-CM | POA: Diagnosis not present

## 2024-02-10 DIAGNOSIS — C44329 Squamous cell carcinoma of skin of other parts of face: Secondary | ICD-10-CM | POA: Diagnosis not present

## 2024-02-10 DIAGNOSIS — C44629 Squamous cell carcinoma of skin of left upper limb, including shoulder: Secondary | ICD-10-CM | POA: Diagnosis not present

## 2024-02-11 ENCOUNTER — Other Ambulatory Visit (HOSPITAL_COMMUNITY): Payer: Self-pay

## 2024-02-12 ENCOUNTER — Other Ambulatory Visit (HOSPITAL_COMMUNITY): Payer: Self-pay

## 2024-02-12 MED ORDER — GERHARDT'S BUTT CREAM
TOPICAL_CREAM | CUTANEOUS | 0 refills | Status: AC
  Filled 2024-02-12: qty 60, 30d supply, fill #0

## 2024-02-15 ENCOUNTER — Other Ambulatory Visit (HOSPITAL_COMMUNITY): Payer: Self-pay

## 2024-02-16 ENCOUNTER — Encounter: Payer: Self-pay | Admitting: Physician Assistant

## 2024-02-16 ENCOUNTER — Ambulatory Visit: Payer: Medicare Other

## 2024-02-16 ENCOUNTER — Ambulatory Visit: Admitting: Physician Assistant

## 2024-02-16 DIAGNOSIS — I480 Paroxysmal atrial fibrillation: Secondary | ICD-10-CM

## 2024-02-16 DIAGNOSIS — L97211 Non-pressure chronic ulcer of right calf limited to breakdown of skin: Secondary | ICD-10-CM

## 2024-02-16 NOTE — Progress Notes (Signed)
 Office Visit Note   Patient: Robert [name]           Date of Birth: 18-Jan-1928           MRN: [id number] Visit Date: 02/16/2024              Requested by: [name], [name]  E, PA [address] [place],  [place] [address] PCP: [name], [name]  E, PA  Chief Complaint  Patient presents with   Right Leg - Wound Check    TV8      HPI: 89 y/o male is here with his wife and daughter for wound care follow up. He has been in compression warps weekly since 11/21/23. He is in the [place] study placed in the standard group.  He has been managed with compression wraps.  They stated the wrap gets tight and they end up cutting the top of it to get it to be a little looser.  He admits he is not elevating above his heart as much as he should.   The main discussion last visit revolved around elevation.  He stated he would lay down and elevate more.    Assessment & Plan: Visit Diagnoses:  1. Non-pressure chronic ulcer of right calf, limited to breakdown of skin (HCC)     Plan: elevation as much as possible.  Multi layer compression wrap re applied.    Follow-Up Instructions: Return in about 1 week (around 02/23/2024).   Ortho Exam  Patient is alert, oriented, no adenopathy, well-dressed, normal affect, normal respiratory effort. Good skin lines in the central lower leg with edema above and below where the dressing was.   Improved leg edema with good skin lines.  No cellulitis.  After verbal permission a 10 blade was used to debride the yellow fibrinous tissue and when pin point bleeding occurs I used silver nitrate to stop the bleeding.   The wounds measured proximally 3.5 cm x 1.7 cm, small skin island then 2 cm x 1.5 cm. One distally 1 cm x 1 cm. The medial wound was 5 cm x 3.8 cm and 1.5 cm x 1.7 cm. Positive decrease in the edema of his right LE.     Imaging:                                  Labs: Lab Results  Component Value Date   HGBA1C  5.8 (H) 11/07/2023   HGBA1C 5.7 (H) 05/10/2013   HGBA1C 5.7 (H) 05/06/2013   ESRSEDRATE 63 (H) 11/07/2023   ESRSEDRATE 65 (H) 11/02/2023   CRP 2.8 (H) 11/07/2023   CRP 7.6 (H) 11/02/2023   REPTSTATUS 11/07/2023 FINAL 11/02/2023   GRAMSTAIN  12/28/2007    FEW WBC PRESENT,BOTH PMN AND MONONUCLEAR NO SQUAMOUS EPITHELIAL CELLS SEEN NO ORGANISMS SEEN   GRAMSTAIN  12/28/2007    FEW WBC PRESENT,BOTH PMN AND MONONUCLEAR NO SQUAMOUS EPITHELIAL CELLS SEEN NO ORGANISMS SEEN   CULT  11/02/2023    NO GROWTH 5 DAYS Performed at [facility], [address] [place], [place] [address]      Lab Results  Component Value Date   ALBUMIN  3.3 (L) 11/20/2023   ALBUMIN  3.5 11/03/2023   ALBUMIN  3.9 11/02/2023   PREALBUMIN 18.3 05/10/2013    Lab Results  Component Value Date   MG 1.8 04/25/2023   MG 1.7 04/24/2023   MG 2.1 05/01/2014   No results found  for: VD25OH  Lab Results  Component Value Date   PREALBUMIN 18.3 05/10/2013      Latest Ref Rng & Units 11/28/2023   11:59 AM 11/26/2023   10:17 AM 11/25/2023   10:06 AM  CBC EXTENDED  WBC 4.0 - 10.5 K/uL 12.4  14.5  14.5   RBC 4.22 - 5.81 MIL/uL 2.90  3.02  2.94   Hemoglobin 13.0 - 17.0 g/dL 9.1  9.4  9.1   HCT 60.9 - 52.0 % 28.5  29.2  28.3   Platelets 150 - 400 K/uL 330  352  372   NEUT# 1.7 - 7.7 K/uL 6.7     Lymph# 0.7 - 4.0 K/uL 4.4        There is no height or weight on file to calculate BMI.  Orders:  No orders of the defined types were placed in this encounter.  No orders of the defined types were placed in this encounter.    Procedures: No procedures performed  Clinical Data: No additional findings.  ROS:  All other systems negative, except as noted in the HPI. Review of Systems  Objective: Vital Signs: There were no vitals taken for this visit.  Specialty Comments:  No specialty comments available.  PMFS History: Patient Active Problem List   Diagnosis Date Noted   Skin tear of right upper  arm without complication 11/29/2023   Pressure injury of coccygeal region, stage 1 11/27/2023   Wound cellulitis 11/25/2023   DNR (do not resuscitate)/DNI(Do Not Intubate) 11/07/2023   Carcinoma of prostate (HCC) 11/02/2023   Cellulitis of right leg 11/02/2023   S/P CABG x 1 08/12/2023   Chronic combined systolic and diastolic CHF (congestive heart failure) (HCC) 08/12/2023   Subtherapeutic international normalized ratio (INR) 04/24/2023   Anemia of chronic disease 04/24/2023   Pacemaker 09/06/2020   Tachycardia-bradycardia syndrome (HCC) 05/03/2020   Secondary hypercoagulable state 04/04/2020   Mixed conductive and sensorineural hearing loss of both ears 09/14/2018   Chronic anticoagulation 05/17/2014   Atrial flutter (HCC) 06/08/2013   S/P AVR - tissue AVR 05/2013 05/12/2013   Aortic stenosis    Coronary atherosclerosis of native coronary artery 04/21/2013   Essential hypertension 04/21/2013   HLD (hyperlipidemia) 04/21/2013   Permanent atrial fibrillation (HCC) - . Left atrial appendage is clipped. 04/21/2013   GERD (gastroesophageal reflux disease) 04/21/2013   Past Medical History:  Diagnosis Date   Acute CHF (HCC) 05/06/2013   Acute respiratory failure with hypoxia (HCC) 04/24/2023   Aortic stenosis    a. s/p tissue AVR 05/2013;  b. Echo (06/2013):  Mod LVH, EF 60-65%, no RWMA, Gr 2 DD, AVR ok (mean 13 mmHg), MAC, mild MR, mod LAE, mild RAE, PASP 46 mmHg (mild pulmo HTN)   Atrial fibrillation with RVR (HCC) 05/06/2013   Blood loss anemia    a. 05/2009 a/w GIB.   CAD (coronary artery disease)    a. Cath 04/2009: BMS to LCx 04/2009; CTO of RCA with L-R collaterals, 40% ostial diag.   Carpal tunnel syndrome    bilateral, carpal tunnel release x 2   Esophageal ulcer    a. 05/2009 with hemorrhage - injected with epinephrine -Dr. Lamar Bunk.   Esophagitis    a. 05/2009 a/w GIB.   GERD (gastroesophageal reflux disease)    EGD, Dr. Quay 2008   GI bleed    a. 05/2009 as  above: with associated anemia. Lower esophageal ulcer with extensive erosive esophagitis and large clot by EGD 05/2009.    Hx  of colonoscopy 2008   Dr. Cindy, polyps- 5 yr f/u recommended   Hyperlipidemia    Hypertension    Influenza A 04/24/2023   PAF (paroxysmal atrial fibrillation) (HCC)    a. Post-op AVR;  b. 04/2014 recurrent.   Perforated ear drum    right ear drum, tube in left ear drum, Dr. Jesus   Peripheral vascular disease    Polymyalgia rheumatica    Prostate cancer (HCC)    Vitamin D  deficiency     Family History  Problem Relation Age of Onset   CVA Mother    Kidney disease Father    Breast cancer Daughter     Past Surgical History:  Procedure Laterality Date   AORTIC VALVE REPLACEMENT N/A 05/12/2013   Procedure: AORTIC VALVE REPLACEMENT (AVR);  Surgeon: Maude Fleeta Ochoa, MD;  Location: [facility] OR;  Service: Open Heart Surgery;  Laterality: N/A;   CARDIAC CATHETERIZATION  2011   CARDIOVERSION N/A 04/12/2020   Procedure: CARDIOVERSION;  Surgeon: Maranda Leim DEL, MD;  Location: [facility] ENDOSCOPY;  Service: Cardiovascular;  Laterality: N/A;   CORONARY ARTERY BYPASS GRAFT N/A 05/12/2013   Procedure: CORONARY ARTERY BYPASS GRAFTING (CABG);  Surgeon: Maude Fleeta Ochoa, MD;  Location: [facility] OR;  Service: Open Heart Surgery;  Laterality: N/A;  CABG x 1 using left leg greater saphenous vein harvested endoscopically   INTRAOPERATIVE TRANSESOPHAGEAL ECHOCARDIOGRAM N/A 05/12/2013   Procedure: INTRAOPERATIVE TRANSESOPHAGEAL ECHOCARDIOGRAM;  Surgeon: Maude Fleeta Ochoa, MD;  Location: [facility] OR;  Service: Open Heart Surgery;  Laterality: N/A;   LEFT AND RIGHT HEART CATHETERIZATION WITH CORONARY ANGIOGRAM N/A 05/09/2013   Procedure: LEFT AND RIGHT HEART CATHETERIZATION WITH CORONARY ANGIOGRAM;  Surgeon: Dorn JINNY Lesches, MD;  Location: [facility] CATH LAB;  Service: Cardiovascular;  Laterality: N/A;   MAZE N/A 05/12/2013   Procedure: MAZE;  Surgeon: Maude Fleeta Ochoa, MD;  Location: [facility] OR;  Service: Open Heart  Surgery;  Laterality: N/A;   PACEMAKER IMPLANT N/A 05/21/2020   Procedure: PACEMAKER IMPLANT;  Surgeon: Waddell Danelle ORN, MD;  Location: [facility] INVASIVE CV LAB;  Service: Cardiovascular;  Laterality: N/A;   PROSTATECTOMY     TEE WITHOUT CARDIOVERSION N/A 04/12/2020   Procedure: TRANSESOPHAGEAL ECHOCARDIOGRAM (TEE);  Surgeon: Maranda Leim DEL, MD;  Location: [facility] ENDOSCOPY;  Service: Cardiovascular;  Laterality: N/A;   Social History   Occupational History   Not on file  Tobacco Use   Smoking status: Never   Smokeless tobacco: Never  Vaping Use   Vaping status: Never Used  Substance and Sexual Activity   Alcohol use: No   Drug use: No   Sexual activity: Not on file

## 2024-02-18 LAB — CUP PACEART REMOTE DEVICE CHECK
Battery Remaining Longevity: 90 mo
Battery Remaining Percentage: 73 %
Battery Voltage: 3.04 V
Brady Statistic RV Percent Paced: 54 %
Date Time Interrogation Session: 20251216075740
Implantable Lead Connection Status: 753985
Implantable Lead Connection Status: 753985
Implantable Lead Implant Date: 20220321
Implantable Lead Implant Date: 20220321
Implantable Lead Location: 753859
Implantable Lead Location: 753860
Implantable Pulse Generator Implant Date: 20220321
Lead Channel Impedance Value: 380 Ohm
Lead Channel Pacing Threshold Amplitude: 0.75 V
Lead Channel Pacing Threshold Pulse Width: 0.4 ms
Lead Channel Sensing Intrinsic Amplitude: 6.6 mV
Lead Channel Setting Pacing Amplitude: 2.5 V
Lead Channel Setting Pacing Pulse Width: 0.4 ms
Lead Channel Setting Sensing Sensitivity: 2 mV
Pulse Gen Model: 2272
Pulse Gen Serial Number: 3909458

## 2024-02-19 ENCOUNTER — Ambulatory Visit: Admitting: Podiatry

## 2024-02-19 ENCOUNTER — Other Ambulatory Visit (HOSPITAL_COMMUNITY): Payer: Self-pay

## 2024-02-19 DIAGNOSIS — M79675 Pain in left toe(s): Secondary | ICD-10-CM

## 2024-02-19 DIAGNOSIS — B351 Tinea unguium: Secondary | ICD-10-CM | POA: Diagnosis not present

## 2024-02-19 DIAGNOSIS — I739 Peripheral vascular disease, unspecified: Secondary | ICD-10-CM | POA: Diagnosis not present

## 2024-02-19 DIAGNOSIS — M79674 Pain in right toe(s): Secondary | ICD-10-CM | POA: Diagnosis not present

## 2024-02-19 NOTE — Progress Notes (Signed)
 Remote PPM Transmission

## 2024-02-19 NOTE — Progress Notes (Signed)
 "     Subjective:  Patient ID: Robert [name], male    DOB: 05-19-1927,  MRN: [id number]  Robert [name] presents to clinic today for:  Chief Complaint  Patient presents with   RFC    RFC no callous Not diabetic No anti coag.    Patient notes nails are thick and elongated, causing pain in shoe gear.  He has a walker today.  He notes that his wife has been trimming his nails recently and had been cutting them too short because there is dried blood present on the tips of the toenails.  Patient states he had been trimming them himself prior to that.  He has a compressive wrap on the right leg and is currently in a study at Dr. Crist office for a 12-week study for wound care  PCP is [name], [name]  E, PA.  Last seen 01/04/2024  Past Medical History:  Diagnosis Date   Acute CHF (HCC) 05/06/2013   Acute respiratory failure with hypoxia (HCC) 04/24/2023   Aortic stenosis    a. s/p tissue AVR 05/2013;  b. Echo (06/2013):  Mod LVH, EF 60-65%, no RWMA, Gr 2 DD, AVR ok (mean 13 mmHg), MAC, mild MR, mod LAE, mild RAE, PASP 46 mmHg (mild pulmo HTN)   Atrial fibrillation with RVR (HCC) 05/06/2013   Blood loss anemia    a. 05/2009 a/w GIB.   CAD (coronary artery disease)    a. Cath 04/2009: BMS to LCx 04/2009; CTO of RCA with L-R collaterals, 40% ostial diag.   Carpal tunnel syndrome    bilateral, carpal tunnel release x 2   Esophageal ulcer    a. 05/2009 with hemorrhage - injected with epinephrine -Dr. Lamar Bunk.   Esophagitis    a. 05/2009 a/w GIB.   GERD (gastroesophageal reflux disease)    EGD, Dr. Quay 2008   GI bleed    a. 05/2009 as above: with associated anemia. Lower esophageal ulcer with extensive erosive esophagitis and large clot by EGD 05/2009.    Hx of colonoscopy 2008   Dr. Cindy, polyps- 5 yr f/u recommended   Hyperlipidemia    Hypertension    Influenza A 04/24/2023   PAF (paroxysmal atrial fibrillation) (HCC)    a. Post-op AVR;  b. 04/2014  recurrent.   Perforated ear drum    right ear drum, tube in left ear drum, Dr. Jesus   Peripheral vascular disease    Polymyalgia rheumatica    Prostate cancer (HCC)    Vitamin D  deficiency    Allergies[1]  Objective:  Robert [name] is a pleasant 89 y.o. male in NAD. AAO x 3.  Vascular Examination: Patient has palpable DP pulse, absent PT pulse bilateral.  Delayed capillary refill bilateral toes.  Sparse digital hair bilateral.  Proximal to distal cooling WNL bilateral.    Dermatological Examination: Interspaces are clear with no open lesions noted bilateral.  Skin is shiny and atrophic bilateral.  Nails are 3-41mm thick, with yellowish/brown discoloration, subungual debris and distal onycholysis x10.  There is pain with compression of nails x10.  Skin is very dry and flaky     Latest Ref Rng & Units 11/07/2023   10:38 AM  Hemoglobin A1C  Hemoglobin-A1c 4.8 - 5.6 % 5.8    Patient qualifies for at-risk foot care because of PVD.  Assessment/Plan: 1. Pain due to onychomycosis of toenails of both feet    Mycotic nails x10 were sharply debrided with sterile nail nippers and power debriding burr to  decrease bulk and length.  Patient did not seem satisfied with his nail care today.  He expressed to his family member that he felt they could be cut much shorter.  We definitely do not want to risk injury to the patient or more wounds.  They are dystrophic and mycotic and were trimmed to an acceptable length today.  Return in about 3 months (around 05/19/2024) for RFC.   Awanda CHARM Imperial, DPM, FACFAS [facility]     [address] [place], [place] [address]                Office [phone]  Fax [phone]    [1]  Allergies Allergen Reactions   Amoxicillin Swelling    penile swelling   Lisinopril Cough   Kenalog  [Triamcinolone ] Rash   "

## 2024-02-23 ENCOUNTER — Ambulatory Visit

## 2024-02-23 ENCOUNTER — Other Ambulatory Visit: Payer: Self-pay | Admitting: Internal Medicine

## 2024-02-23 DIAGNOSIS — L97211 Non-pressure chronic ulcer of right calf limited to breakdown of skin: Secondary | ICD-10-CM

## 2024-02-23 DIAGNOSIS — I739 Peripheral vascular disease, unspecified: Secondary | ICD-10-CM

## 2024-02-23 NOTE — Progress Notes (Signed)
 Subject came in today for dressing change. He has manipulated the dressing cutting the compression wrap on the medial and lateral side of the leg. The dressing has rolled down the leg somewhat. The dressing was removed and the wounds were cleaned. Off loading of the shin and ulcers to the posterior calf were archived by folding ABD pad in half and placing on each side of the shin and on each side of the ulcer. Gause applied to other wounds and protocol specific dressing applied to the leg. Advised the subject that if the dressing feels tight then the leg has bee down and dependant too long and that he needs to elevate the leg higher than his heart. The subject and his daughter both voiced understanding and will come in next week for dressing change. All photos obtained for trail. He is SOC only no FSG.   Rateel Beldin, RMA, CWCA

## 2024-02-23 NOTE — Telephone Encounter (Signed)
 Pt is seen in the CHF clinic for Dr. Waddell please address for refill for digoxin 

## 2024-02-28 ENCOUNTER — Ambulatory Visit: Payer: Self-pay | Admitting: Internal Medicine

## 2024-03-01 ENCOUNTER — Encounter: Payer: Self-pay | Admitting: Physician Assistant

## 2024-03-01 ENCOUNTER — Ambulatory Visit: Admitting: Physician Assistant

## 2024-03-01 DIAGNOSIS — L97211 Non-pressure chronic ulcer of right calf limited to breakdown of skin: Secondary | ICD-10-CM

## 2024-03-01 NOTE — Progress Notes (Signed)
 "  Office Visit Note   Patient: Robert [name]           Date of Birth: 03-13-1927           MRN: [id number] Visit Date: 03/01/2024              Requested by: [name], [name]  E, PA [address] [place],  [place] [address] PCP: [name], [name]  E, PA  Chief Complaint  Patient presents with   Right Leg - Follow-up      HPI: 89 y/o male is here with his wife and daughter for wound care follow up. He has been in compression warps weekly since 11/21/23. He is in the [place] study placed in the standard group.  He has been managed with compression wraps.  They stated the wrap gets tight and they end up cutting the top of it to get it to be a little looser.  He admits he is not elevating above his heart as much as he should.              The main discussion last visit revolved around elevation.  He stated he would lay down and elevate more.    Assessment & Plan: Visit Diagnoses:  1. Non-pressure chronic ulcer of right calf, limited to breakdown of skin (HCC)     Plan: Dynaflex standard wrap was reapplied to the left LE.  He will elevate above his heart when he is at rest.    Follow-Up Instructions: Return in about 1 week (around 03/08/2024).   Ortho Exam  Patient is alert, oriented, no adenopathy, well-dressed, normal affect, normal respiratory effort. Improved leg edema with good skin lines.  No cellulitis.  4 x 4 with Vashe was used to debride the yellow fibrinous tissue and when pin point bleeding occurs I used silver nitrate to stop the bleeding.   The wounds measured proximally 3.5 cm x 1.7 cm, small skin island then 2 cm x 1.5 cm. One distally 1 cm x 1 cm. The medial wound was 4.5 cm x 5 cm and 1.5 cm x 1.7 cm. Positive decrease in the edema of his right LE.     Imaging: No results found.     Labs: Lab Results  Component Value Date   HGBA1C 5.8 (H) 11/07/2023   HGBA1C 5.7 (H) 05/10/2013   HGBA1C 5.7 (H) 05/06/2013   ESRSEDRATE 63 (H) 11/07/2023    ESRSEDRATE 65 (H) 11/02/2023   CRP 2.8 (H) 11/07/2023   CRP 7.6 (H) 11/02/2023   REPTSTATUS 11/07/2023 FINAL 11/02/2023   GRAMSTAIN  12/28/2007    FEW WBC PRESENT,BOTH PMN AND MONONUCLEAR NO SQUAMOUS EPITHELIAL CELLS SEEN NO ORGANISMS SEEN   GRAMSTAIN  12/28/2007    FEW WBC PRESENT,BOTH PMN AND MONONUCLEAR NO SQUAMOUS EPITHELIAL CELLS SEEN NO ORGANISMS SEEN   CULT  11/02/2023    NO GROWTH 5 DAYS Performed at [facility], [address], [place], [place] [address]      Lab Results  Component Value Date   ALBUMIN  3.3 (L) 11/20/2023   ALBUMIN  3.5 11/03/2023   ALBUMIN  3.9 11/02/2023   PREALBUMIN 18.3 05/10/2013    Lab Results  Component Value Date   MG 1.8 04/25/2023   MG 1.7 04/24/2023   MG 2.1 05/01/2014   No results found for: [facility]  Lab Results  Component Value Date   PREALBUMIN 18.3 05/10/2013      Latest Ref Rng & Units 11/28/2023   11:59 AM 11/26/2023  10:17 AM 11/25/2023   10:06 AM  CBC EXTENDED  WBC 4.0 - 10.5 K/uL 12.4  14.5  14.5   RBC 4.22 - 5.81 MIL/uL 2.90  3.02  2.94   Hemoglobin 13.0 - 17.0 g/dL 9.1  9.4  9.1   HCT 60.9 - 52.0 % 28.5  29.2  28.3   Platelets 150 - 400 K/uL 330  352  372   NEUT# 1.7 - 7.7 K/uL 6.7     Lymph# 0.7 - 4.0 K/uL 4.4        There is no height or weight on file to calculate BMI.  Orders:  No orders of the defined types were placed in this encounter.  No orders of the defined types were placed in this encounter.    Procedures: No procedures performed  Clinical Data: No additional findings.  ROS:  All other systems negative, except as noted in the HPI. Review of Systems  Objective: Vital Signs: There were no vitals taken for this visit.  Specialty Comments:  No specialty comments available.  PMFS History: Patient Active Problem List   Diagnosis Date Noted   Skin tear of right upper arm without complication 11/29/2023   Pressure injury of coccygeal region, stage 1 11/27/2023   Wound  cellulitis 11/25/2023   DNR (do not resuscitate)/DNI(Do Not Intubate) 11/07/2023   Carcinoma of prostate (HCC) 11/02/2023   Cellulitis of right leg 11/02/2023   S/P CABG x 1 08/12/2023   Chronic combined systolic and diastolic CHF (congestive heart failure) (HCC) 08/12/2023   Subtherapeutic international normalized ratio (INR) 04/24/2023   Anemia of chronic disease 04/24/2023   Pacemaker 09/06/2020   Tachycardia-bradycardia syndrome (HCC) 05/03/2020   Secondary hypercoagulable state 04/04/2020   Mixed conductive and sensorineural hearing loss of both ears 09/14/2018   Chronic anticoagulation 05/17/2014   Atrial flutter (HCC) 06/08/2013   S/P AVR - tissue AVR 05/2013 05/12/2013   Aortic stenosis    Coronary atherosclerosis of native coronary artery 04/21/2013   Essential hypertension 04/21/2013   HLD (hyperlipidemia) 04/21/2013   Permanent atrial fibrillation (HCC) - . Left atrial appendage is clipped. 04/21/2013   GERD (gastroesophageal reflux disease) 04/21/2013   Past Medical History:  Diagnosis Date   Acute CHF (HCC) 05/06/2013   Acute respiratory failure with hypoxia (HCC) 04/24/2023   Aortic stenosis    a. s/p tissue AVR 05/2013;  b. Echo (06/2013):  Mod LVH, EF 60-65%, no RWMA, Gr 2 DD, AVR ok (mean 13 mmHg), MAC, mild MR, mod LAE, mild RAE, PASP 46 mmHg (mild pulmo HTN)   Atrial fibrillation with RVR (HCC) 05/06/2013   Blood loss anemia    a. 05/2009 a/w GIB.   CAD (coronary artery disease)    a. Cath 04/2009: BMS to LCx 04/2009; CTO of RCA with L-R collaterals, 40% ostial diag.   Carpal tunnel syndrome    bilateral, carpal tunnel release x 2   Esophageal ulcer    a. 05/2009 with hemorrhage - injected with epinephrine -Dr. Lamar Bunk.   Esophagitis    a. 05/2009 a/w GIB.   GERD (gastroesophageal reflux disease)    EGD, Dr. Quay 2008   GI bleed    a. 05/2009 as above: with associated anemia. Lower esophageal ulcer with extensive erosive esophagitis and large clot  by EGD 05/2009.    Hx of colonoscopy 2008   Dr. Cindy, polyps- 5 yr f/u recommended   Hyperlipidemia    Hypertension    Influenza A 04/24/2023   PAF (paroxysmal atrial fibrillation) (HCC)  a. Post-op AVR;  b. 04/2014 recurrent.   Perforated ear drum    right ear drum, tube in left ear drum, Dr. Jesus   Peripheral vascular disease    Polymyalgia rheumatica    Prostate cancer (HCC)    Vitamin D  deficiency     Family History  Problem Relation Age of Onset   CVA Mother    Kidney disease Father    Breast cancer Daughter     Past Surgical History:  Procedure Laterality Date   AORTIC VALVE REPLACEMENT N/A 05/12/2013   Procedure: AORTIC VALVE REPLACEMENT (AVR);  Surgeon: Maude Fleeta Ochoa, MD;  Location: [facility] OR;  Service: Open Heart Surgery;  Laterality: N/A;   CARDIAC CATHETERIZATION  2011   CARDIOVERSION N/A 04/12/2020   Procedure: CARDIOVERSION;  Surgeon: Maranda Leim DEL, MD;  Location: [facility] ENDOSCOPY;  Service: Cardiovascular;  Laterality: N/A;   CORONARY ARTERY BYPASS GRAFT N/A 05/12/2013   Procedure: CORONARY ARTERY BYPASS GRAFTING (CABG);  Surgeon: Maude Fleeta Ochoa, MD;  Location: [facility] OR;  Service: Open Heart Surgery;  Laterality: N/A;  CABG x 1 using left leg greater saphenous vein harvested endoscopically   INTRAOPERATIVE TRANSESOPHAGEAL ECHOCARDIOGRAM N/A 05/12/2013   Procedure: INTRAOPERATIVE TRANSESOPHAGEAL ECHOCARDIOGRAM;  Surgeon: Maude Fleeta Ochoa, MD;  Location: [facility] OR;  Service: Open Heart Surgery;  Laterality: N/A;   LEFT AND RIGHT HEART CATHETERIZATION WITH CORONARY ANGIOGRAM N/A 05/09/2013   Procedure: LEFT AND RIGHT HEART CATHETERIZATION WITH CORONARY ANGIOGRAM;  Surgeon: Dorn JINNY Lesches, MD;  Location: [facility] CATH LAB;  Service: Cardiovascular;  Laterality: N/A;   MAZE N/A 05/12/2013   Procedure: MAZE;  Surgeon: Maude Fleeta Ochoa, MD;  Location: [facility] OR;  Service: Open Heart Surgery;  Laterality: N/A;   PACEMAKER IMPLANT N/A 05/21/2020   Procedure: PACEMAKER IMPLANT;  Surgeon:  Waddell Danelle ORN, MD;  Location: [facility] INVASIVE CV LAB;  Service: Cardiovascular;  Laterality: N/A;   PROSTATECTOMY     TEE WITHOUT CARDIOVERSION N/A 04/12/2020   Procedure: TRANSESOPHAGEAL ECHOCARDIOGRAM (TEE);  Surgeon: Maranda Leim DEL, MD;  Location: [facility] ENDOSCOPY;  Service: Cardiovascular;  Laterality: N/A;   Social History   Occupational History   Not on file  Tobacco Use   Smoking status: Never   Smokeless tobacco: Never  Vaping Use   Vaping status: Never Used  Substance and Sexual Activity   Alcohol use: No   Drug use: No   Sexual activity: Not on file       "

## 2024-03-08 ENCOUNTER — Ambulatory Visit: Admitting: Physician Assistant

## 2024-03-08 ENCOUNTER — Encounter: Payer: Self-pay | Admitting: Physician Assistant

## 2024-03-08 DIAGNOSIS — L97211 Non-pressure chronic ulcer of right calf limited to breakdown of skin: Secondary | ICD-10-CM

## 2024-03-08 NOTE — Progress Notes (Signed)
 "  Office Visit Note   Patient: Robert [name]           Date of Birth: September 05, 1927           MRN: [id number] Visit Date: 03/08/2024              Requested by: [name], [name]  E, PA [address] [place],  [place] [address] PCP: [name], [name]  E, PA  Chief Complaint  Patient presents with   Right Leg - Follow-up    THOR V11      HPI: 89 y/o male is here with his wife and daughter for wound care follow up. He has been in compression warps weekly since 11/21/23. He is in the [place] study placed in the standard group.  He is elevating more.  He has 2 more visits after this one that are included in the study.  We will discuss the next step once the study is done after the 13 th visit.    Assessment & Plan: Visit Diagnoses:  1. Non-pressure chronic ulcer of right calf, limited to breakdown of skin (HCC)     Plan: Dynaflex standard wrap was reapplied to the left LE. He will elevate above his heart when he is at rest.  No cellulitis and no new wounds.  Good skin lines.    Follow-Up Instructions: Return in about 1 week (around 03/15/2024).   Ortho Exam  Patient is alert, oriented, no adenopathy, well-dressed, normal affect, normal respiratory effort. 4 x 4 with Vashe was used to dbride the wounds 50 % yellow fibrinous tissue.  He tolerates the debridement better than previous debridements.  He has good skin lines with decreased edema in the right LE and foot.    The wounds measured proximally 3.5 cm x 1.7 cm, small skin island then 2 cm x 1.5 cm. One distally 1 cm x 1 cm. The medial wound was 4.5 cm x 5 cm and 1.5 cm x 1.7 cm.  Posterior cal/achilles area measures 6 cm x 2 cm.  Positive decrease in the edema of his right LE.    Imaging:                    Labs: Lab Results  Component Value Date   HGBA1C 5.8 (H) 11/07/2023   HGBA1C 5.7 (H) 05/10/2013   HGBA1C 5.7 (H) 05/06/2013   ESRSEDRATE 63 (H) 11/07/2023   ESRSEDRATE 65 (H) 11/02/2023   CRP  2.8 (H) 11/07/2023   CRP 7.6 (H) 11/02/2023   REPTSTATUS 11/07/2023 FINAL 11/02/2023   GRAMSTAIN  12/28/2007    FEW WBC PRESENT,BOTH PMN AND MONONUCLEAR NO SQUAMOUS EPITHELIAL CELLS SEEN NO ORGANISMS SEEN   GRAMSTAIN  12/28/2007    FEW WBC PRESENT,BOTH PMN AND MONONUCLEAR NO SQUAMOUS EPITHELIAL CELLS SEEN NO ORGANISMS SEEN   CULT  11/02/2023    NO GROWTH 5 DAYS Performed at [facility], [address] [place], [place] [address]      Lab Results  Component Value Date   ALBUMIN  3.3 (L) 11/20/2023   ALBUMIN  3.5 11/03/2023   ALBUMIN  3.9 11/02/2023   PREALBUMIN 18.3 05/10/2013    Lab Results  Component Value Date   MG 1.8 04/25/2023   MG 1.7 04/24/2023   MG 2.1 05/01/2014   No results found for: [facility]  Lab Results  Component Value Date   PREALBUMIN 18.3 05/10/2013      Latest Ref Rng & Units 11/28/2023   11:59 AM 11/26/2023  10:17 AM 11/25/2023   10:06 AM  CBC EXTENDED  WBC 4.0 - 10.5 K/uL 12.4  14.5  14.5   RBC 4.22 - 5.81 MIL/uL 2.90  3.02  2.94   Hemoglobin 13.0 - 17.0 g/dL 9.1  9.4  9.1   HCT 60.9 - 52.0 % 28.5  29.2  28.3   Platelets 150 - 400 K/uL 330  352  372   NEUT# 1.7 - 7.7 K/uL 6.7     Lymph# 0.7 - 4.0 K/uL 4.4        There is no height or weight on file to calculate BMI.  Orders:  No orders of the defined types were placed in this encounter.  No orders of the defined types were placed in this encounter.    Procedures: No procedures performed  Clinical Data: No additional findings.  ROS:  All other systems negative, except as noted in the HPI. Review of Systems  Objective: Vital Signs: There were no vitals taken for this visit.  Specialty Comments:  No specialty comments available.  PMFS History: Patient Active Problem List   Diagnosis Date Noted   Skin tear of right upper arm without complication 11/29/2023   Pressure injury of coccygeal region, stage 1 11/27/2023   Wound cellulitis 11/25/2023   DNR (do not  resuscitate)/DNI(Do Not Intubate) 11/07/2023   Carcinoma of prostate (HCC) 11/02/2023   Cellulitis of right leg 11/02/2023   S/P CABG x 1 08/12/2023   Chronic combined systolic and diastolic CHF (congestive heart failure) (HCC) 08/12/2023   Subtherapeutic international normalized ratio (INR) 04/24/2023   Anemia of chronic disease 04/24/2023   Pacemaker 09/06/2020   Tachycardia-bradycardia syndrome (HCC) 05/03/2020   Secondary hypercoagulable state 04/04/2020   Mixed conductive and sensorineural hearing loss of both ears 09/14/2018   Chronic anticoagulation 05/17/2014   Atrial flutter (HCC) 06/08/2013   S/P AVR - tissue AVR 05/2013 05/12/2013   Aortic stenosis    Coronary atherosclerosis of native coronary artery 04/21/2013   Essential hypertension 04/21/2013   HLD (hyperlipidemia) 04/21/2013   Permanent atrial fibrillation (HCC) - . Left atrial appendage is clipped. 04/21/2013   GERD (gastroesophageal reflux disease) 04/21/2013   Past Medical History:  Diagnosis Date   Acute CHF (HCC) 05/06/2013   Acute respiratory failure with hypoxia (HCC) 04/24/2023   Aortic stenosis    a. s/p tissue AVR 05/2013;  b. Echo (06/2013):  Mod LVH, EF 60-65%, no RWMA, Gr 2 DD, AVR ok (mean 13 mmHg), MAC, mild MR, mod LAE, mild RAE, PASP 46 mmHg (mild pulmo HTN)   Atrial fibrillation with RVR (HCC) 05/06/2013   Blood loss anemia    a. 05/2009 a/w GIB.   CAD (coronary artery disease)    a. Cath 04/2009: BMS to LCx 04/2009; CTO of RCA with L-R collaterals, 40% ostial diag.   Carpal tunnel syndrome    bilateral, carpal tunnel release x 2   Esophageal ulcer    a. 05/2009 with hemorrhage - injected with epinephrine -Dr. Lamar Bunk.   Esophagitis    a. 05/2009 a/w GIB.   GERD (gastroesophageal reflux disease)    EGD, Dr. Quay 2008   GI bleed    a. 05/2009 as above: with associated anemia. Lower esophageal ulcer with extensive erosive esophagitis and large clot by EGD 05/2009.    Hx of colonoscopy  2008   Dr. Cindy, polyps- 5 yr f/u recommended   Hyperlipidemia    Hypertension    Influenza A 04/24/2023   PAF (paroxysmal atrial fibrillation) (HCC)  a. Post-op AVR;  b. 04/2014 recurrent.   Perforated ear drum    right ear drum, tube in left ear drum, Dr. Jesus   Peripheral vascular disease    Polymyalgia rheumatica    Prostate cancer (HCC)    Vitamin D  deficiency     Family History  Problem Relation Age of Onset   CVA Mother    Kidney disease Father    Breast cancer Daughter     Past Surgical History:  Procedure Laterality Date   AORTIC VALVE REPLACEMENT N/A 05/12/2013   Procedure: AORTIC VALVE REPLACEMENT (AVR);  Surgeon: Maude Fleeta Ochoa, MD;  Location: [facility] OR;  Service: Open Heart Surgery;  Laterality: N/A;   CARDIAC CATHETERIZATION  2011   CARDIOVERSION N/A 04/12/2020   Procedure: CARDIOVERSION;  Surgeon: Maranda Leim DEL, MD;  Location: [facility] ENDOSCOPY;  Service: Cardiovascular;  Laterality: N/A;   CORONARY ARTERY BYPASS GRAFT N/A 05/12/2013   Procedure: CORONARY ARTERY BYPASS GRAFTING (CABG);  Surgeon: Maude Fleeta Ochoa, MD;  Location: [facility] OR;  Service: Open Heart Surgery;  Laterality: N/A;  CABG x 1 using left leg greater saphenous vein harvested endoscopically   INTRAOPERATIVE TRANSESOPHAGEAL ECHOCARDIOGRAM N/A 05/12/2013   Procedure: INTRAOPERATIVE TRANSESOPHAGEAL ECHOCARDIOGRAM;  Surgeon: Maude Fleeta Ochoa, MD;  Location[facility] OR;  Service: Open Heart Surgery;  Laterality: N/A;   LEFT AND RIGHT HEART CATHETERIZATION WITH CORONARY ANGIOGRAM N/A 05/09/2013   Procedure: LEFT AND RIGHT HEART CATHETERIZATION WITH CORONARY ANGIOGRAM;  Surgeon: Dorn JINNY Lesches, MD;  Location: [facility] CATH LAB;  Service: Cardiovascular;  Laterality: N/A;   MAZE N/A 05/12/2013   Procedure: MAZE;  Surgeon: Maude Fleeta Ochoa, MD;  Location: [facility] OR;  Service: Open Heart Surgery;  Laterality: N/A;   PACEMAKER IMPLANT N/A 05/21/2020   Procedure: PACEMAKER IMPLANT;  Surgeon: Waddell Danelle ORN, MD;  Location: [facility]  INVASIVE CV LAB;  Service: Cardiovascular;  Laterality: N/A;   PROSTATECTOMY     TEE WITHOUT CARDIOVERSION N/A 04/12/2020   Procedure: TRANSESOPHAGEAL ECHOCARDIOGRAM (TEE);  Surgeon: Maranda Leim DEL, MD;  Location: [facility] ENDOSCOPY;  Service: Cardiovascular;  Laterality: N/A;   Social History   Occupational History   Not on file  Tobacco Use   Smoking status: Never   Smokeless tobacco: Never  Vaping Use   Vaping status: Never Used  Substance and Sexual Activity   Alcohol use: No   Drug use: No   Sexual activity: Not on file       "

## 2024-03-08 NOTE — Addendum Note (Signed)
 Addended by: GEROME MAURILIO HERO on: 03/08/2024 08:08 AM   Modules accepted: Level of Service

## 2024-03-15 ENCOUNTER — Ambulatory Visit: Admitting: Physician Assistant

## 2024-03-15 ENCOUNTER — Encounter: Payer: Self-pay | Admitting: Physician Assistant

## 2024-03-15 DIAGNOSIS — L97211 Non-pressure chronic ulcer of right calf limited to breakdown of skin: Secondary | ICD-10-CM

## 2024-03-15 DIAGNOSIS — I739 Peripheral vascular disease, unspecified: Secondary | ICD-10-CM

## 2024-03-15 NOTE — Progress Notes (Signed)
 "  Office Visit Note   Patient: Robert [name]           Date of Birth: 16-Apr-1927           MRN: [id number] Visit Date: 03/15/2024              Requested by: [name], [name]  E, PA [address] [place],  [place] [address] PCP: [name], [name]  E, PA  Chief Complaint  Patient presents with   Right Leg - Follow-up      HPI: 89 y/o male is here with his wife and daughter for wound care follow up. He has been in compression warps weekly since 11/21/23. He is in the [place] study placed in the standard group.  He is elevating more.  He has 1 more visits after this one that are included in the study.  We will discuss the next step once the study is done after the 13 th visit.    Assessment & Plan: Visit Diagnoses:  1. Non-pressure chronic ulcer of right calf, limited to breakdown of skin (HCC)   2. PAD (peripheral artery disease)     Plan: Chronic right Leg wounds without evidence of infection.  His pain has improved over the last several weeks and he tolerates cleaning the wound beds better. With Vashe and 4 x 4 .  Dynaflex standard wrap was reapplied to the left LE. He will elevate above his heart when he is at rest.   Follow-Up Instructions: Return in about 1 week (around 03/22/2024).   Ortho Exam  Patient is alert, oriented, no adenopathy, well-dressed, normal affect, normal respiratory effort. He has good skin lines with no edema.  He has monophasic  doppler signals in the DP/PT without ischemic skin changes on the feet.   The wounds measured proximally 3.5 cm x 1.7 cm, small skin island then 2 cm x 1.5 cm. One distally 1 cm x 1 cm. The medial wound was 4.5 cm x 5 cm and 1.5 cm x 1.7 cm.  Posterior cal/achilles area measures 6 cm x 2 cm.  There is not much change in wound size, but there is better control of edema.     Imaging: No results found.            Labs: Lab Results  Component Value Date   HGBA1C 5.8 (H) 11/07/2023   HGBA1C 5.7 (H) 05/10/2013    HGBA1C 5.7 (H) 05/06/2013   ESRSEDRATE 63 (H) 11/07/2023   ESRSEDRATE 65 (H) 11/02/2023   CRP 2.8 (H) 11/07/2023   CRP 7.6 (H) 11/02/2023   REPTSTATUS 11/07/2023 FINAL 11/02/2023   GRAMSTAIN  12/28/2007    FEW WBC PRESENT,BOTH PMN AND MONONUCLEAR NO SQUAMOUS EPITHELIAL CELLS SEEN NO ORGANISMS SEEN   GRAMSTAIN  12/28/2007    FEW WBC PRESENT,BOTH PMN AND MONONUCLEAR NO SQUAMOUS EPITHELIAL CELLS SEEN NO ORGANISMS SEEN   CULT  11/02/2023    NO GROWTH 5 DAYS Performed at [facility], [address], [place], [place] [address]      Lab Results  Component Value Date   ALBUMIN  3.3 (L) 11/20/2023   ALBUMIN  3.5 11/03/2023   ALBUMIN  3.9 11/02/2023   PREALBUMIN 18.3 05/10/2013    Lab Results  Component Value Date   MG 1.8 04/25/2023   MG 1.7 04/24/2023   MG 2.1 05/01/2014   No results found for: [facility]  Lab Results  Component Value Date   PREALBUMIN 18.3 05/10/2013      Latest Ref  Rng & Units 11/28/2023   11:59 AM 11/26/2023   10:17 AM 11/25/2023   10:06 AM  CBC EXTENDED  WBC 4.0 - 10.5 K/uL 12.4  14.5  14.5   RBC 4.22 - 5.81 MIL/uL 2.90  3.02  2.94   Hemoglobin 13.0 - 17.0 g/dL 9.1  9.4  9.1   HCT 60.9 - 52.0 % 28.5  29.2  28.3   Platelets 150 - 400 K/uL 330  352  372   NEUT# 1.7 - 7.7 K/uL 6.7     Lymph# 0.7 - 4.0 K/uL 4.4        There is no height or weight on file to calculate BMI.  Orders:  No orders of the defined types were placed in this encounter.  No orders of the defined types were placed in this encounter.    Procedures: No procedures performed  Clinical Data: No additional findings.  ROS:  All other systems negative, except as noted in the HPI. Review of Systems  Objective: Vital Signs: There were no vitals taken for this visit.  Specialty Comments:  No specialty comments available.  PMFS History: Patient Active Problem List   Diagnosis Date Noted   Skin tear of right upper arm without complication 11/29/2023   Pressure  injury of coccygeal region, stage 1 11/27/2023   Wound cellulitis 11/25/2023   DNR (do not resuscitate)/DNI(Do Not Intubate) 11/07/2023   Carcinoma of prostate (HCC) 11/02/2023   Cellulitis of right leg 11/02/2023   S/P CABG x 1 08/12/2023   Chronic combined systolic and diastolic CHF (congestive heart failure) (HCC) 08/12/2023   Subtherapeutic international normalized ratio (INR) 04/24/2023   Anemia of chronic disease 04/24/2023   Pacemaker 09/06/2020   Tachycardia-bradycardia syndrome (HCC) 05/03/2020   Secondary hypercoagulable state 04/04/2020   Mixed conductive and sensorineural hearing loss of both ears 09/14/2018   Chronic anticoagulation 05/17/2014   Atrial flutter (HCC) 06/08/2013   S/P AVR - tissue AVR 05/2013 05/12/2013   Aortic stenosis    Coronary atherosclerosis of native coronary artery 04/21/2013   Essential hypertension 04/21/2013   HLD (hyperlipidemia) 04/21/2013   Permanent atrial fibrillation (HCC) - . Left atrial appendage is clipped. 04/21/2013   GERD (gastroesophageal reflux disease) 04/21/2013   Past Medical History:  Diagnosis Date   Acute CHF (HCC) 05/06/2013   Acute respiratory failure with hypoxia (HCC) 04/24/2023   Aortic stenosis    a. s/p tissue AVR 05/2013;  b. Echo (06/2013):  Mod LVH, EF 60-65%, no RWMA, Gr 2 DD, AVR ok (mean 13 mmHg), MAC, mild MR, mod LAE, mild RAE, PASP 46 mmHg (mild pulmo HTN)   Atrial fibrillation with RVR (HCC) 05/06/2013   Blood loss anemia    a. 05/2009 a/w GIB.   CAD (coronary artery disease)    a. Cath 04/2009: BMS to LCx 04/2009; CTO of RCA with L-R collaterals, 40% ostial diag.   Carpal tunnel syndrome    bilateral, carpal tunnel release x 2   Esophageal ulcer    a. 05/2009 with hemorrhage - injected with epinephrine -Dr. Lamar Bunk.   Esophagitis    a. 05/2009 a/w GIB.   GERD (gastroesophageal reflux disease)    EGD, Dr. Quay 2008   GI bleed    a. 05/2009 as above: with associated anemia. Lower esophageal  ulcer with extensive erosive esophagitis and large clot by EGD 05/2009.    Hx of colonoscopy 2008   Dr. Cindy, polyps- 5 yr f/u recommended   Hyperlipidemia    Hypertension  Influenza A 04/24/2023   PAF (paroxysmal atrial fibrillation) (HCC)    a. Post-op AVR;  b. 04/2014 recurrent.   Perforated ear drum    right ear drum, tube in left ear drum, Dr. Jesus   Peripheral vascular disease    Polymyalgia rheumatica    Prostate cancer (HCC)    Vitamin D  deficiency     Family History  Problem Relation Age of Onset   CVA Mother    Kidney disease Father    Breast cancer Daughter     Past Surgical History:  Procedure Laterality Date   AORTIC VALVE REPLACEMENT N/A 05/12/2013   Procedure: AORTIC VALVE REPLACEMENT (AVR);  Surgeon: Maude Fleeta Ochoa, MD;  Location: [facility] OR;  Service: Open Heart Surgery;  Laterality: N/A;   CARDIAC CATHETERIZATION  2011   CARDIOVERSION N/A 04/12/2020   Procedure: CARDIOVERSION;  Surgeon: Maranda Leim DEL, MD;  Location: [facility] ENDOSCOPY;  Service: Cardiovascular;  Laterality: N/A;   CORONARY ARTERY BYPASS GRAFT N/A 05/12/2013   Procedure: CORONARY ARTERY BYPASS GRAFTING (CABG);  Surgeon: Maude Fleeta Ochoa, MD;  Location: [facility] OR;  Service: Open Heart Surgery;  Laterality: N/A;  CABG x 1 using left leg greater saphenous vein harvested endoscopically   INTRAOPERATIVE TRANSESOPHAGEAL ECHOCARDIOGRAM N/A 05/12/2013   Procedure: INTRAOPERATIVE TRANSESOPHAGEAL ECHOCARDIOGRAM;  Surgeon: Maude Fleeta Ochoa, MD;  Location: [facility] OR;  Service: Open Heart Surgery;  Laterality: N/A;   LEFT AND RIGHT HEART CATHETERIZATION WITH CORONARY ANGIOGRAM N/A 05/09/2013   Procedure: LEFT AND RIGHT HEART CATHETERIZATION WITH CORONARY ANGIOGRAM;  Surgeon: Dorn JINNY Lesches, MD;  Location: [facility] CATH LAB;  Service: Cardiovascular;  Laterality: N/A;   MAZE N/A 05/12/2013   Procedure: MAZE;  Surgeon: Maude Fleeta Ochoa, MD;  Location: [facility] OR;  Service: Open Heart Surgery;  Laterality: N/A;   PACEMAKER IMPLANT  N/A 05/21/2020   Procedure: PACEMAKER IMPLANT;  Surgeon: Waddell Danelle ORN, MD;  Location: [facility] INVASIVE CV LAB;  Service: Cardiovascular;  Laterality: N/A;   PROSTATECTOMY     TEE WITHOUT CARDIOVERSION N/A 04/12/2020   Procedure: TRANSESOPHAGEAL ECHOCARDIOGRAM (TEE);  Surgeon: Maranda Leim DEL, MD;  Location: [facility] ENDOSCOPY;  Service: Cardiovascular;  Laterality: N/A;   Social History   Occupational History   Not on file  Tobacco Use   Smoking status: Never   Smokeless tobacco: Never  Vaping Use   Vaping status: Never Used  Substance and Sexual Activity   Alcohol use: No   Drug use: No   Sexual activity: Not on file       "

## 2024-03-22 ENCOUNTER — Encounter: Payer: Self-pay | Admitting: Physician Assistant

## 2024-03-22 ENCOUNTER — Ambulatory Visit: Admitting: Physician Assistant

## 2024-03-22 DIAGNOSIS — L97211 Non-pressure chronic ulcer of right calf limited to breakdown of skin: Secondary | ICD-10-CM

## 2024-03-22 NOTE — Progress Notes (Signed)
 "  Office Visit Note   Patient: Robert [name]           Date of Birth: June 15, 1927           MRN: [id number] Visit Date: 03/22/2024              Requested by: [name], [name]  E, PA [address] [place],  [place] [address] PCP: [name], [name]  E, PA  Chief Complaint  Patient presents with   Right Leg - Wound Check    THOR V13      HPI: 89 y/o male is here with his daughter for wound care follow up. He has been in compression warps weekly since 11/21/23. He is in the [place] study placed in the standard group. He is elevating more. This is his final visit in the thor study.    Assessment & Plan: Visit Diagnoses:  1. Non-pressure chronic ulcer of right calf, limited to breakdown of skin (HCC)     Plan:  Chronic right Leg wounds without evidence of infection.  His pain has improved over the last several weeks and he tolerates cleaning the wound beds better. With Vashe and 4 x 4 .  Dynaflex standard wrap was reapplied to the left LE. He will elevate above his heart when he is at rest.   Pending insurance approval for future Kerecis use.   He will see [name] next visit.  He may need debridement in the OR to clean the wounds.  Elevation and compression have improved the swelling and promoted healing.     Follow-Up Instructions: Return in about 1 week (around 03/29/2024).   Ortho Exam  Patient is alert, oriented, no adenopathy, well-dressed, normal affect, normal respiratory effort. Yellow fibrinous tissue over all wound beds.  I debrided it with 4 x 4 and Vashe. He does tolerate it better than previously, but he does have pain with wound manipulation.     4 cm x 3.5 cm anterior  5.5 cm x 3.3 cm proximal posterior  Distal posterior 1 cm x 1 cm Medial 4.5 cm x 3.5 cm Distally medial 3 cm x 1.5 cm He has monophasic  doppler signals in the DP/PT without ischemic skin changes on the feet.        Imaging:          Labs: Lab Results  Component Value  Date   HGBA1C 5.8 (H) 11/07/2023   HGBA1C 5.7 (H) 05/10/2013   HGBA1C 5.7 (H) 05/06/2013   ESRSEDRATE 63 (H) 11/07/2023   ESRSEDRATE 65 (H) 11/02/2023   CRP 2.8 (H) 11/07/2023   CRP 7.6 (H) 11/02/2023   REPTSTATUS 11/07/2023 FINAL 11/02/2023   GRAMSTAIN  12/28/2007    FEW WBC PRESENT,BOTH PMN AND MONONUCLEAR NO SQUAMOUS EPITHELIAL CELLS SEEN NO ORGANISMS SEEN   GRAMSTAIN  12/28/2007    FEW WBC PRESENT,BOTH PMN AND MONONUCLEAR NO SQUAMOUS EPITHELIAL CELLS SEEN NO ORGANISMS SEEN   CULT  11/02/2023    NO GROWTH 5 DAYS Performed at [facility], [address] [place], [place] [address]      Lab Results  Component Value Date   ALBUMIN  3.3 (L) 11/20/2023   ALBUMIN  3.5 11/03/2023   ALBUMIN  3.9 11/02/2023   PREALBUMIN 18.3 05/10/2013    Lab Results  Component Value Date   MG 1.8 04/25/2023   MG 1.7 04/24/2023   MG 2.1 05/01/2014   No results found for: [facility]  Lab Results  Component Value Date   PREALBUMIN  18.3 05/10/2013      Latest Ref Rng & Units 11/28/2023   11:59 AM 11/26/2023   10:17 AM 11/25/2023   10:06 AM  CBC EXTENDED  WBC 4.0 - 10.5 K/uL 12.4  14.5  14.5   RBC 4.22 - 5.81 MIL/uL 2.90  3.02  2.94   Hemoglobin 13.0 - 17.0 g/dL 9.1  9.4  9.1   HCT 60.9 - 52.0 % 28.5  29.2  28.3   Platelets 150 - 400 K/uL 330  352  372   NEUT# 1.7 - 7.7 K/uL 6.7     Lymph# 0.7 - 4.0 K/uL 4.4        There is no height or weight on file to calculate BMI.  Orders:  No orders of the defined types were placed in this encounter.  No orders of the defined types were placed in this encounter.    Procedures: No procedures performed  Clinical Data: No additional findings.  ROS:  All other systems negative, except as noted in the HPI. Review of Systems  Objective: Vital Signs: There were no vitals taken for this visit.  Specialty Comments:  No specialty comments available.  PMFS History: Patient Active Problem List   Diagnosis Date Noted   Skin tear  of right upper arm without complication 11/29/2023   Pressure injury of coccygeal region, stage 1 11/27/2023   Wound cellulitis 11/25/2023   DNR (do not resuscitate)/DNI(Do Not Intubate) 11/07/2023   Carcinoma of prostate (HCC) 11/02/2023   Cellulitis of right leg 11/02/2023   S/P CABG x 1 08/12/2023   Chronic combined systolic and diastolic CHF (congestive heart failure) (HCC) 08/12/2023   Subtherapeutic international normalized ratio (INR) 04/24/2023   Anemia of chronic disease 04/24/2023   Pacemaker 09/06/2020   Tachycardia-bradycardia syndrome (HCC) 05/03/2020   Secondary hypercoagulable state 04/04/2020   Mixed conductive and sensorineural hearing loss of both ears 09/14/2018   Chronic anticoagulation 05/17/2014   Atrial flutter (HCC) 06/08/2013   S/P AVR - tissue AVR 05/2013 05/12/2013   Aortic stenosis    Coronary atherosclerosis of native coronary artery 04/21/2013   Essential hypertension 04/21/2013   HLD (hyperlipidemia) 04/21/2013   Permanent atrial fibrillation (HCC) - . Left atrial appendage is clipped. 04/21/2013   GERD (gastroesophageal reflux disease) 04/21/2013   Past Medical History:  Diagnosis Date   Acute CHF (HCC) 05/06/2013   Acute respiratory failure with hypoxia (HCC) 04/24/2023   Aortic stenosis    a. s/p tissue AVR 05/2013;  b. Echo (06/2013):  Mod LVH, EF 60-65%, no RWMA, Gr 2 DD, AVR ok (mean 13 mmHg), MAC, mild MR, mod LAE, mild RAE, PASP 46 mmHg (mild pulmo HTN)   Atrial fibrillation with RVR (HCC) 05/06/2013   Blood loss anemia    a. 05/2009 a/w GIB.   CAD (coronary artery disease)    a. Cath 04/2009: BMS to LCx 04/2009; CTO of RCA with L-R collaterals, 40% ostial diag.   Carpal tunnel syndrome    bilateral, carpal tunnel release x 2   Esophageal ulcer    a. 05/2009 with hemorrhage - injected with epinephrine -Dr. Lamar Bunk.   Esophagitis    a. 05/2009 a/w GIB.   GERD (gastroesophageal reflux disease)    EGD, Dr. Quay 2008   GI bleed     a. 05/2009 as above: with associated anemia. Lower esophageal ulcer with extensive erosive esophagitis and large clot by EGD 05/2009.    Hx of colonoscopy 2008   Dr. Cindy, polyps- 5 yr f/u recommended  Hyperlipidemia    Hypertension    Influenza A 04/24/2023   PAF (paroxysmal atrial fibrillation) (HCC)    a. Post-op AVR;  b. 04/2014 recurrent.   Perforated ear drum    right ear drum, tube in left ear drum, Dr. Jesus   Peripheral vascular disease    Polymyalgia rheumatica    Prostate cancer (HCC)    Vitamin D  deficiency     Family History  Problem Relation Age of Onset   CVA Mother    Kidney disease Father    Breast cancer Daughter     Past Surgical History:  Procedure Laterality Date   AORTIC VALVE REPLACEMENT N/A 05/12/2013   Procedure: AORTIC VALVE REPLACEMENT (AVR);  Surgeon: Maude Fleeta Ochoa, MD;  Location: [facility] OR;  Service: Open Heart Surgery;  Laterality: N/A;   CARDIAC CATHETERIZATION  2011   CARDIOVERSION N/A 04/12/2020   Procedure: CARDIOVERSION;  Surgeon: Maranda Leim DEL, MD;  Location: [facility] ENDOSCOPY;  Service: Cardiovascular;  Laterality: N/A;   CORONARY ARTERY BYPASS GRAFT N/A 05/12/2013   Procedure: CORONARY ARTERY BYPASS GRAFTING (CABG);  Surgeon: Maude Fleeta Ochoa, MD;  Location: [facility] OR;  Service: Open Heart Surgery;  Laterality: N/A;  CABG x 1 using left leg greater saphenous vein harvested endoscopically   INTRAOPERATIVE TRANSESOPHAGEAL ECHOCARDIOGRAM N/A 05/12/2013   Procedure: INTRAOPERATIVE TRANSESOPHAGEAL ECHOCARDIOGRAM;  Surgeon: Maude Fleeta Ochoa, MD;  Location: [facility] OR;  Service: Open Heart Surgery;  Laterality: N/A;   LEFT AND RIGHT HEART CATHETERIZATION WITH CORONARY ANGIOGRAM N/A 05/09/2013   Procedure: LEFT AND RIGHT HEART CATHETERIZATION WITH CORONARY ANGIOGRAM;  Surgeon: Dorn JINNY Lesches, MD;  Location: [facility] CATH LAB;  Service: Cardiovascular;  Laterality: N/A;   MAZE N/A 05/12/2013   Procedure: MAZE;  Surgeon: Maude Fleeta Ochoa, MD;  Location: [facility] OR;   Service: Open Heart Surgery;  Laterality: N/A;   PACEMAKER IMPLANT N/A 05/21/2020   Procedure: PACEMAKER IMPLANT;  Surgeon: Waddell Danelle ORN, MD;  Location: [facility] INVASIVE CV LAB;  Service: Cardiovascular;  Laterality: N/A;   PROSTATECTOMY     TEE WITHOUT CARDIOVERSION N/A 04/12/2020   Procedure: TRANSESOPHAGEAL ECHOCARDIOGRAM (TEE);  Surgeon: Maranda Leim DEL, MD;  Location: [facility] ENDOSCOPY;  Service: Cardiovascular;  Laterality: N/A;   Social History   Occupational History   Not on file  Tobacco Use   Smoking status: Never   Smokeless tobacco: Never  Vaping Use   Vaping status: Never Used  Substance and Sexual Activity   Alcohol use: No   Drug use: No   Sexual activity: Not on file       "

## 2024-03-24 ENCOUNTER — Other Ambulatory Visit: Payer: Self-pay | Admitting: Physician Assistant

## 2024-03-24 ENCOUNTER — Ambulatory Visit: Admitting: Orthopedic Surgery

## 2024-03-24 DIAGNOSIS — L97211 Non-pressure chronic ulcer of right calf limited to breakdown of skin: Secondary | ICD-10-CM | POA: Diagnosis not present

## 2024-03-24 DIAGNOSIS — Z5181 Encounter for therapeutic drug level monitoring: Secondary | ICD-10-CM

## 2024-03-24 DIAGNOSIS — I739 Peripheral vascular disease, unspecified: Secondary | ICD-10-CM

## 2024-03-24 MED ORDER — HYDROCODONE-ACETAMINOPHEN 5-325 MG PO TABS: 1.0000 | ORAL_TABLET | ORAL | 0 refills | Status: AC | PRN

## 2024-03-28 ENCOUNTER — Encounter: Payer: Self-pay | Admitting: Orthopedic Surgery

## 2024-03-28 NOTE — Progress Notes (Signed)
 "  Office Visit Note   Patient: Robert [name]           Date of Birth: [birth year]           MRN: [id number] Visit Date: 03/24/2024              Requested by: [name], [name]  E, PA [address] [place],  [place] [address] PCP: [name], [name]  E, PA  Chief Complaint  Patient presents with   Right Leg - Follow-up      HPI: Discussed the use of AI scribe software for clinical note transcription with the patient, who gave verbal consent to proceed.  History of Present Illness Robert [name] is a 89 year old male with chronic venous hypertension who presents for follow-up of non-healing right lower extremity ulcers.  He completed thirteen weeks in the [place] study for chronic venous ulcers of the right lower extremity without evidence of wound healing. The original ulcer was deep and has merged with adjacent ulcers over time. Additional ulcers developed, some attributed to tape from prior wound care. Home care was provided prior to current management, with family members assisting in wound care and transportation.  He has undergone compression wrap therapy and ongoing wound care, including cleansing and dressing changes. He is currently out of pain medication, which has impacted his comfort during wound care procedures. Wounds have been monitored with photographs between visits. He denies systemic symptoms such as fever or chills.  Insurance coverage for ongoing wound care, including Autom[employer], has been investigated, with Medicare and secondary insurance expected to cover the majority of costs.     Assessment & Plan: Visit Diagnoses:  1. PAD (peripheral artery disease)   2. Non-pressure chronic ulcer of right calf, limited to breakdown of skin (HCC)     Plan: Assessment and Plan Assessment & Plan Chronic venous stasis ulcers of the right lower extremity Four ulcers with total area over 40 cm. Robust bleeding and granulation post-debridement.  Tailored approach to maximize healing potential. Anticipated improved healing with micrograft and compression therapy. - Cleansed ulcers with Vashe. - Debrided nonviable tissue to achieve bleeding granulation tissue. - Measured ulcers: lateral (3.5 x 5 cm), anterior (4 x 2 cm), medial (3 x 5 cm), posterior (3 x 2 cm). - Applied compression wraps to right lower extremity. - Planned 38 cm Kerasys micrograft for wound coverage; insurance confirmed. - Instructed to keep wrap in place until next follow-up. - Scheduled follow-up in one week to assess healing and determine need for daily dressing changes. - Will consider daily dressing changes after follow-up if indicated.      Follow-Up Instructions: Return in about 1 week (around 03/31/2024).   Ortho Exam  Patient is alert, oriented, no adenopathy, well-dressed, normal affect, normal respiratory effort. Physical Exam CARDIOVASCULAR: Strong palpable dorsalis pedis pulse. SKIN: Brawny skin color changes with venous insufficiency. Four ulcers present with good bleeding grain-like tissue after cleansing. Lateral ulcer measures 3.5 x 5 cm. Anterior ulcer measures 4 x 2 cm. Medial malignant ulcer measures 3 x 5 cm. Posterior ulcer measures 3 x 2 cm.   The wound healing has stalled, the wound bed has healthy granulation tissue, and patient presents for evaluation and application of Kerecis MariGen Micro graft. After informed consent a 10 blade knife was used to debride the skin and soft tissue to healthy viable bleeding granulation tissue.  Silver nitrate was used for hemostasis. The total wound surface area of these 4 wounds is greater than  40 cm. Kerecis MariGen micro tissue graft 38 cm2 was applied, and there was no wastage.  Please see the photo below of the Lot number and expiration date. The micro tissue graft was covered with a nonadherent Adaptic dressing, bolstered with 4 x 4 gauze and secured with a compression wrap.      Imaging: No  results found. No images are attached to the encounter.  Labs: Lab Results  Component Value Date   HGBA1C 5.8 (H) 11/07/2023   HGBA1C 5.7 (H) 05/10/2013   HGBA1C 5.7 (H) 05/06/2013   ESRSEDRATE 63 (H) 11/07/2023   ESRSEDRATE 65 (H) 11/02/2023   CRP 2.8 (H) 11/07/2023   CRP 7.6 (H) 11/02/2023   REPTSTATUS 11/07/2023 FINAL 11/02/2023   GRAMSTAIN  12/28/2007    FEW WBC PRESENT,BOTH PMN AND MONONUCLEAR NO SQUAMOUS EPITHELIAL CELLS SEEN NO ORGANISMS SEEN   GRAMSTAIN  12/28/2007    FEW WBC PRESENT,BOTH PMN AND MONONUCLEAR NO SQUAMOUS EPITHELIAL CELLS SEEN NO ORGANISMS SEEN   CULT  11/02/2023    NO GROWTH 5 DAYS Performed at [facility], [address], [place], [place] [address]      Lab Results  Component Value Date   ALBUMIN  3.3 (L) 11/20/2023   ALBUMIN  3.5 11/03/2023   ALBUMIN  3.9 11/02/2023   PREALBUMIN 18.3 05/10/2013    Lab Results  Component Value Date   MG 1.8 04/25/2023   MG 1.7 04/24/2023   MG 2.1 05/01/2014   No results found for: [facility]  Lab Results  Component Value Date   PREALBUMIN 18.3 05/10/2013      Latest Ref Rng & Units 11/28/2023   11:59 AM 11/26/2023   10:17 AM 11/25/2023   10:06 AM  CBC EXTENDED  WBC 4.0 - 10.5 K/uL 12.4  14.5  14.5   RBC 4.22 - 5.81 MIL/uL 2.90  3.02  2.94   Hemoglobin 13.0 - 17.0 g/dL 9.1  9.4  9.1   HCT 60.9 - 52.0 % 28.5  29.2  28.3   Platelets 150 - 400 K/uL 330  352  372   NEUT# 1.7 - 7.7 K/uL 6.7     Lymph# 0.7 - 4.0 K/uL 4.4        There is no height or weight on file to calculate BMI.  Orders:  No orders of the defined types were placed in this encounter.  Meds ordered this encounter  Medications   HYDROcodone -acetaminophen  (NORCO/VICODIN) 5-325 MG tablet    Sig: Take 1 tablet by mouth every 4 (four) hours as needed for moderate pain (pain score 4-6).    Dispense:  30 tablet    Refill:  0     Procedures: No procedures performed  Clinical Data: No additional findings.  ROS:  All other  systems negative, except as noted in the HPI. Review of Systems  Objective: Vital Signs: There were no vitals taken for this visit.  Specialty Comments:  No specialty comments available.  PMFS History: Patient Active Problem List   Diagnosis Date Noted   Skin tear of right upper arm without complication 11/29/2023   Pressure injury of coccygeal region, stage 1 11/27/2023   Wound cellulitis 11/25/2023   DNR (do not resuscitate)/DNI(Do Not Intubate) 11/07/2023   Carcinoma of prostate (HCC) 11/02/2023   Cellulitis of right leg 11/02/2023   S/P CABG x 1 08/12/2023   Chronic combined systolic and diastolic CHF (congestive heart failure) (HCC) 08/12/2023   Subtherapeutic international normalized ratio (INR) 04/24/2023   Anemia of chronic disease  04/24/2023   Pacemaker 09/06/2020   Tachycardia-bradycardia syndrome (HCC) 05/03/2020   Secondary hypercoagulable state 04/04/2020   Mixed conductive and sensorineural hearing loss of both ears 09/14/2018   Chronic anticoagulation 05/17/2014   Atrial flutter (HCC) 06/08/2013   S/P AVR - tissue AVR 05/2013 05/12/2013   Aortic stenosis    Coronary atherosclerosis of native coronary artery 04/21/2013   Essential hypertension 04/21/2013   HLD (hyperlipidemia) 04/21/2013   Permanent atrial fibrillation (HCC) - . Left atrial appendage is clipped. 04/21/2013   GERD (gastroesophageal reflux disease) 04/21/2013   Past Medical History:  Diagnosis Date   Acute CHF (HCC) 05/06/2013   Acute respiratory failure with hypoxia (HCC) 04/24/2023   Aortic stenosis    a. s/p tissue AVR 05/2013;  b. Echo (06/2013):  Mod LVH, EF 60-65%, no RWMA, Gr 2 DD, AVR ok (mean 13 mmHg), MAC, mild MR, mod LAE, mild RAE, PASP 46 mmHg (mild pulmo HTN)   Atrial fibrillation with RVR (HCC) 05/06/2013   Blood loss anemia    a. 05/2009 a/w GIB.   CAD (coronary artery disease)    a. Cath 04/2009: BMS to LCx 04/2009; CTO of RCA with L-R collaterals, 40% ostial diag.   Carpal tunnel  syndrome    bilateral, carpal tunnel release x 2   Esophageal ulcer    a. 05/2009 with hemorrhage - injected with epinephrine -Dr. Lamar Bunk.   Esophagitis    a. 05/2009 a/w GIB.   GERD (gastroesophageal reflux disease)    EGD, Dr. Quay 2008   GI bleed    a. 05/2009 as above: with associated anemia. Lower esophageal ulcer with extensive erosive esophagitis and large clot by EGD 05/2009.    Hx of colonoscopy 2008   Dr. Cindy, polyps- 5 yr f/u recommended   Hyperlipidemia    Hypertension    Influenza A 04/24/2023   PAF (paroxysmal atrial fibrillation) (HCC)    a. Post-op AVR;  b. 04/2014 recurrent.   Perforated ear drum    right ear drum, tube in left ear drum, Dr. Jesus   Peripheral vascular disease    Polymyalgia rheumatica    Prostate cancer (HCC)    Vitamin D  deficiency     Family History  Problem Relation Age of Onset   CVA Mother    Kidney disease Father    Breast cancer Daughter     Past Surgical History:  Procedure Laterality Date   AORTIC VALVE REPLACEMENT N/A 05/12/2013   Procedure: AORTIC VALVE REPLACEMENT (AVR);  Surgeon: Maude Fleeta Ochoa, MD;  Location: [facility] OR;  Service: Open Heart Surgery;  Laterality: N/A;   CARDIAC CATHETERIZATION  2011   CARDIOVERSION N/A 04/12/2020   Procedure: CARDIOVERSION;  Surgeon: Maranda Leim DEL, MD;  Location: [facility] ENDOSCOPY;  Service: Cardiovascular;  Laterality: N/A;   CORONARY ARTERY BYPASS GRAFT N/A 05/12/2013   Procedure: CORONARY ARTERY BYPASS GRAFTING (CABG);  Surgeon: Maude Fleeta Ochoa, MD;  Location: [facility] OR;  Service: Open Heart Surgery;  Laterality: N/A;  CABG x 1 using left leg greater saphenous vein harvested endoscopically   INTRAOPERATIVE TRANSESOPHAGEAL ECHOCARDIOGRAM N/A 05/12/2013   Procedure: INTRAOPERATIVE TRANSESOPHAGEAL ECHOCARDIOGRAM;  Surgeon: Maude Fleeta Ochoa, MD;  Location: [facility] OR;  Service: Open Heart Surgery;  Laterality: N/A;   LEFT AND RIGHT HEART CATHETERIZATION WITH CORONARY ANGIOGRAM N/A  05/09/2013   Procedure: LEFT AND RIGHT HEART CATHETERIZATION WITH CORONARY ANGIOGRAM;  Surgeon: Dorn JINNY Lesches, MD;  Location: [facility] CATH LAB;  Service: Cardiovascular;  Laterality: N/A;   MAZE N/A 05/12/2013  Procedure: MAZE;  Surgeon: Maude Fleeta Ochoa, MD;  Location: [facility] OR;  Service: Open Heart Surgery;  Laterality: N/A;   PACEMAKER IMPLANT N/A 05/21/2020   Procedure: PACEMAKER IMPLANT;  Surgeon: Waddell Danelle ORN, MD;  Location[facility] INVASIVE CV LAB;  Service: Cardiovascular;  Laterality: N/A;   PROSTATECTOMY     TEE WITHOUT CARDIOVERSION N/A 04/12/2020   Procedure: TRANSESOPHAGEAL ECHOCARDIOGRAM (TEE);  Surgeon: Maranda Leim DEL, MD;  Location: [facility] ENDOSCOPY;  Service: Cardiovascular;  Laterality: N/A;   Social History   Occupational History   Not on file  Tobacco Use   Smoking status: Never   Smokeless tobacco: Never  Vaping Use   Vaping status: Never Used  Substance and Sexual Activity   Alcohol use: No   Drug use: No   Sexual activity: Not on file         "

## 2024-03-29 ENCOUNTER — Ambulatory Visit: Admitting: Orthopedic Surgery

## 2024-03-30 NOTE — Telephone Encounter (Signed)
 Lipids Completed on 06/04/23 Care Everywhere

## 2024-03-31 ENCOUNTER — Ambulatory Visit: Admitting: Physician Assistant

## 2024-03-31 ENCOUNTER — Encounter: Payer: Self-pay | Admitting: Physician Assistant

## 2024-03-31 DIAGNOSIS — L97211 Non-pressure chronic ulcer of right calf limited to breakdown of skin: Secondary | ICD-10-CM

## 2024-03-31 NOTE — Progress Notes (Signed)
 "  Office Visit Note   Patient: Robert [name]           Date of Birth: [birth year]           MRN: [id number] Visit Date: 03/31/2024              Requested by: [name], [name]  E, PA [address] [place],  [place] [address] PCP: [name], [name]  E, PA  Chief Complaint  Patient presents with   Right Leg - Follow-up      HPI: 89 y/o male is here with his daughter for wound care follow up. He has been in compression warps weekly since 11/21/23. He is in the [place] study placed in the standard group.   He is no longer in the study group.  We did get insurance approval for [facility] on his last visit.  He is here for follow up exam.  Assessment & Plan: Visit Diagnoses:  1. Non-pressure chronic ulcer of right calf, limited to breakdown of skin (HCC)     Plan: Right LEW chronic leg wounds No change in wound bed size compared to last week. Improved edema  No cellulitis 38 cm Kerecis micrograft for wound coverage, adaptic then 4 x4 guaze and ace wrap. Plan for family to change the dressing daily and protect the tissue graft for 1 week.  Pending the weather if they are unable to follow up in 1 week he may remove the dressing and shower.  The wet to dry Vashe dressing until next f/u.     Follow-Up Instructions: Return in about 1 week (around 04/07/2024).   Ortho Exam  Patient is alert, oriented, no adenopathy, well-dressed, normal affect, normal respiratory effort. Measured ulcers: lateral (3.5 x 5 cm), anterior (4 x 2 cm), medial (3 x 5 cm), posterior (3 x 2 cm).  No change in size of wounds from last week.  No cellulitis and he has good skin lines without edema.  The wound beds were cleaned/debrided with 4 x 4 and Vashe.  Pinpoint bleeding occurred.                           Labs: Lab Results  Component Value Date   HGBA1C 5.8 (H) 11/07/2023   HGBA1C 5.7 (H) 05/10/2013   HGBA1C 5.7 (H) 05/06/2013   ESRSEDRATE 63 (H) 11/07/2023   ESRSEDRATE 65  (H) 11/02/2023   CRP 2.8 (H) 11/07/2023   CRP 7.6 (H) 11/02/2023   REPTSTATUS 11/07/2023 FINAL 11/02/2023   GRAMSTAIN  12/28/2007    FEW WBC PRESENT,BOTH PMN AND MONONUCLEAR NO SQUAMOUS EPITHELIAL CELLS SEEN NO ORGANISMS SEEN   GRAMSTAIN  12/28/2007    FEW WBC PRESENT,BOTH PMN AND MONONUCLEAR NO SQUAMOUS EPITHELIAL CELLS SEEN NO ORGANISMS SEEN   CULT  11/02/2023    NO GROWTH 5 DAYS Performed at [facility], [address] [place], [place] [address]      Lab Results  Component Value Date   ALBUMIN  3.3 (L) 11/20/2023   ALBUMIN  3.5 11/03/2023   ALBUMIN  3.9 11/02/2023   PREALBUMIN 18.3 05/10/2013    Lab Results  Component Value Date   MG 1.8 04/25/2023   MG 1.7 04/24/2023   MG 2.1 05/01/2014   No results found for: [facility]  Lab Results  Component Value Date   PREALBUMIN 18.3 05/10/2013      Latest Ref Rng & Units 11/28/2023   11:59 AM 11/26/2023   10:17 AM 11/25/2023  10:06 AM  CBC EXTENDED  WBC 4.0 - 10.5 K/uL 12.4  14.5  14.5   RBC 4.22 - 5.81 MIL/uL 2.90  3.02  2.94   Hemoglobin 13.0 - 17.0 g/dL 9.1  9.4  9.1   HCT 60.9 - 52.0 % 28.5  29.2  28.3   Platelets 150 - 400 K/uL 330  352  372   NEUT# 1.7 - 7.7 K/uL 6.7     Lymph# 0.7 - 4.0 K/uL 4.4        There is no height or weight on file to calculate BMI.  Orders:  No orders of the defined types were placed in this encounter.  No orders of the defined types were placed in this encounter.    Procedures: No procedures performed  Clinical Data: No additional findings.  ROS:  All other systems negative, except as noted in the HPI. Review of Systems  Objective: Vital Signs: There were no vitals taken for this visit.  Specialty Comments:  No specialty comments available.  PMFS History: Patient Active Problem List   Diagnosis Date Noted   Skin tear of right upper arm without complication 11/29/2023   Pressure injury of coccygeal region, stage 1 11/27/2023   Wound cellulitis 11/25/2023    DNR (do not resuscitate)/DNI(Do Not Intubate) 11/07/2023   Carcinoma of prostate (HCC) 11/02/2023   Cellulitis of right leg 11/02/2023   S/P CABG x 1 08/12/2023   Chronic combined systolic and diastolic CHF (congestive heart failure) (HCC) 08/12/2023   Subtherapeutic international normalized ratio (INR) 04/24/2023   Anemia of chronic disease 04/24/2023   Pacemaker 09/06/2020   Tachycardia-bradycardia syndrome (HCC) 05/03/2020   Secondary hypercoagulable state 04/04/2020   Mixed conductive and sensorineural hearing loss of both ears 09/14/2018   Chronic anticoagulation 05/17/2014   Atrial flutter (HCC) 06/08/2013   S/P AVR - tissue AVR 05/2013 05/12/2013   Aortic stenosis    Coronary atherosclerosis of native coronary artery 04/21/2013   Essential hypertension 04/21/2013   HLD (hyperlipidemia) 04/21/2013   Permanent atrial fibrillation (HCC) - . Left atrial appendage is clipped. 04/21/2013   GERD (gastroesophageal reflux disease) 04/21/2013   Past Medical History:  Diagnosis Date   Acute CHF (HCC) 05/06/2013   Acute respiratory failure with hypoxia (HCC) 04/24/2023   Aortic stenosis    a. s/p tissue AVR 05/2013;  b. Echo (06/2013):  Mod LVH, EF 60-65%, no RWMA, Gr 2 DD, AVR ok (mean 13 mmHg), MAC, mild MR, mod LAE, mild RAE, PASP 46 mmHg (mild pulmo HTN)   Atrial fibrillation with RVR (HCC) 05/06/2013   Blood loss anemia    a. 05/2009 a/w GIB.   CAD (coronary artery disease)    a. Cath 04/2009: BMS to LCx 04/2009; CTO of RCA with L-R collaterals, 40% ostial diag.   Carpal tunnel syndrome    bilateral, carpal tunnel release x 2   Esophageal ulcer    a. 05/2009 with hemorrhage - injected with epinephrine -Dr. Lamar Bunk.   Esophagitis    a. 05/2009 a/w GIB.   GERD (gastroesophageal reflux disease)    EGD, Dr. Quay 2008   GI bleed    a. 05/2009 as above: with associated anemia. Lower esophageal ulcer with extensive erosive esophagitis and large clot by EGD 05/2009.    Hx  of colonoscopy 2008   Dr. Cindy, polyps- 5 yr f/u recommended   Hyperlipidemia    Hypertension    Influenza A 04/24/2023   PAF (paroxysmal atrial fibrillation) (HCC)    a. Post-op  AVR;  b. 04/2014 recurrent.   Perforated ear drum    right ear drum, tube in left ear drum, Dr. Jesus   Peripheral vascular disease    Polymyalgia rheumatica    Prostate cancer (HCC)    Vitamin D  deficiency     Family History  Problem Relation Age of Onset   CVA Mother    Kidney disease Father    Breast cancer Daughter     Past Surgical History:  Procedure Laterality Date   AORTIC VALVE REPLACEMENT N/A 05/12/2013   Procedure: AORTIC VALVE REPLACEMENT (AVR);  Surgeon: Maude Fleeta Ochoa, MD;  Location: [facility] OR;  Service: Open Heart Surgery;  Laterality: N/A;   CARDIAC CATHETERIZATION  2011   CARDIOVERSION N/A 04/12/2020   Procedure: CARDIOVERSION;  Surgeon: Maranda Leim DEL, MD;  Location[facility] ENDOSCOPY;  Service: Cardiovascular;  Laterality: N/A;   CORONARY ARTERY BYPASS GRAFT N/A 05/12/2013   Procedure: CORONARY ARTERY BYPASS GRAFTING (CABG);  Surgeon: Maude Fleeta Ochoa, MD;  Location[facility];  Service: Open Heart Surgery;  Laterality: N/A;  CABG x 1 using left leg greater saphenous vein harvested endoscopically   INTRAOPERATIVE TRANSESOPHAGEAL ECHOCARDIOGRAM N/A 05/12/2013   Procedure: INTRAOPERATIVE TRANSESOPHAGEAL ECHOCARDIOGRAM;  Surgeon: Maude Fleeta Ochoa, MD;  Location: [facility] OR;  Service: Open Heart Surgery;  Laterality: N/A;   LEFT AND RIGHT HEART CATHETERIZATION WITH CORONARY ANGIOGRAM N/A 05/09/2013   Procedure: LEFT AND RIGHT HEART CATHETERIZATION WITH CORONARY ANGIOGRAM;  Surgeon: Dorn JINNY Lesches, MD;  Location: [facility] CATH LAB;  Service: Cardiovascular;  Laterality: N/A;   MAZE N/A 05/12/2013   Procedure: MAZE;  Surgeon: Maude Fleeta Ochoa, MD;  Location: [facility] OR;  Service: Open Heart Surgery;  Laterality: N/A;   PACEMAKER IMPLANT N/A 05/21/2020   Procedure: PACEMAKER IMPLANT;  Surgeon: Waddell Danelle ORN, MD;   Location: [facility] INVASIVE CV LAB;  Service: Cardiovascular;  Laterality: N/A;   PROSTATECTOMY     TEE WITHOUT CARDIOVERSION N/A 04/12/2020   Procedure: TRANSESOPHAGEAL ECHOCARDIOGRAM (TEE);  Surgeon: Maranda Leim DEL, MD;  Location: [facility] ENDOSCOPY;  Service: Cardiovascular;  Laterality: N/A;   Social History   Occupational History   Not on file  Tobacco Use   Smoking status: Never   Smokeless tobacco: Never  Vaping Use   Vaping status: Never Used  Substance and Sexual Activity   Alcohol use: No   Drug use: No   Sexual activity: Not on file       "

## 2024-04-07 ENCOUNTER — Ambulatory Visit: Admitting: Physician Assistant

## 2024-04-07 ENCOUNTER — Encounter: Payer: Self-pay | Admitting: Physician Assistant

## 2024-04-07 DIAGNOSIS — L97211 Non-pressure chronic ulcer of right calf limited to breakdown of skin: Secondary | ICD-10-CM

## 2024-04-07 NOTE — Progress Notes (Signed)
 "  Office Visit Note   Patient: Robert [name]           Date of Birth: Jun 10, 1927           MRN: [id number] Visit Date: 04/07/2024              Requested by: [name], [name]  E, PA [address] [place],  [place] [address] PCP: [name], [name]  E, PA  Chief Complaint  Patient presents with   Right Leg - Follow-up      HPI: 89 y/o male with chronic right LE wound from venous insuffiencey.  He was in the Luck study originally and was in the standard group.SABRA He is now receiving Kerecis with daily dressing changes using Vashe under insurance approval. He and both his daughters are here today for follow up.      Assessment & Plan: Visit Diagnoses:  1. Non-pressure chronic ulcer of right calf, limited to breakdown of skin (HCC)     Plan:  Right LE chronic leg wounds  Improved edema  No cellulitis 38 cm Kerecis micrograft for wound coverage, adaptic then 4 x4 guaze and ace wrap. Plan for family to change the dressing daily and protect the tissue graft for 1 week.  Elevation multiple times a day when at rest.  I gave him exercises to do as well to include wiggling his toes and moving his ankle to increase blood flow demand.     Follow-Up Instructions: Return in about 1 week (around 04/14/2024).   Ortho Exam  Patient is alert, oriented, no adenopathy, well-dressed, normal affect, normal respiratory effort. Measured ulcers: lateral (3.5 x 5 cm), anterior (3.5 cm x 3.5 cm cm), medial (3 x 5 cm), (2 cm x 2 cm) medial/distal, posterior (5 x 3 cm).  Good skin lines, no ischemic changes.     Imaging: No results found.          Labs: Lab Results  Component Value Date   HGBA1C 5.8 (H) 11/07/2023   HGBA1C 5.7 (H) 05/10/2013   HGBA1C 5.7 (H) 05/06/2013   ESRSEDRATE 63 (H) 11/07/2023   ESRSEDRATE 65 (H) 11/02/2023   CRP 2.8 (H) 11/07/2023   CRP 7.6 (H) 11/02/2023   REPTSTATUS 11/07/2023 FINAL 11/02/2023   GRAMSTAIN  12/28/2007    FEW WBC PRESENT,BOTH  PMN AND MONONUCLEAR NO SQUAMOUS EPITHELIAL CELLS SEEN NO ORGANISMS SEEN   GRAMSTAIN  12/28/2007    FEW WBC PRESENT,BOTH PMN AND MONONUCLEAR NO SQUAMOUS EPITHELIAL CELLS SEEN NO ORGANISMS SEEN   CULT  11/02/2023    NO GROWTH 5 DAYS Performed at [facility], [address], [place], [place] [address]      Lab Results  Component Value Date   ALBUMIN  3.3 (L) 11/20/2023   ALBUMIN  3.5 11/03/2023   ALBUMIN  3.9 11/02/2023   PREALBUMIN 18.3 05/10/2013    Lab Results  Component Value Date   MG 1.8 04/25/2023   MG 1.7 04/24/2023   MG 2.1 05/01/2014   No results found for: [facility]  Lab Results  Component Value Date   PREALBUMIN 18.3 05/10/2013      Latest Ref Rng & Units 11/28/2023   11:59 AM 11/26/2023   10:17 AM 11/25/2023   10:06 AM  CBC EXTENDED  WBC 4.0 - 10.5 K/uL 12.4  14.5  14.5   RBC 4.22 - 5.81 MIL/uL 2.90  3.02  2.94   Hemoglobin 13.0 - 17.0 g/dL 9.1  9.4  9.1   HCT 60.9 - 52.0 %  28.5  29.2  28.3   Platelets 150 - 400 K/uL 330  352  372   NEUT# 1.7 - 7.7 K/uL 6.7     Lymph# 0.7 - 4.0 K/uL 4.4        There is no height or weight on file to calculate BMI.  Orders:  No orders of the defined types were placed in this encounter.  No orders of the defined types were placed in this encounter.    Procedures: No procedures performed  Clinical Data: No additional findings.  ROS:  All other systems negative, except as noted in the HPI. Review of Systems  Objective: Vital Signs: There were no vitals taken for this visit.  Specialty Comments:  No specialty comments available.  PMFS History: Patient Active Problem List   Diagnosis Date Noted   Skin tear of right upper arm without complication 11/29/2023   Pressure injury of coccygeal region, stage 1 11/27/2023   Wound cellulitis 11/25/2023   DNR (do not resuscitate)/DNI(Do Not Intubate) 11/07/2023   Carcinoma of prostate (HCC) 11/02/2023   Cellulitis of right leg 11/02/2023   S/P CABG x 1  08/12/2023   Chronic combined systolic and diastolic CHF (congestive heart failure) (HCC) 08/12/2023   Subtherapeutic international normalized ratio (INR) 04/24/2023   Anemia of chronic disease 04/24/2023   Pacemaker 09/06/2020   Tachycardia-bradycardia syndrome (HCC) 05/03/2020   Secondary hypercoagulable state 04/04/2020   Mixed conductive and sensorineural hearing loss of both ears 09/14/2018   Chronic anticoagulation 05/17/2014   Atrial flutter (HCC) 06/08/2013   S/P AVR - tissue AVR 05/2013 05/12/2013   Aortic stenosis    Coronary atherosclerosis of native coronary artery 04/21/2013   Essential hypertension 04/21/2013   HLD (hyperlipidemia) 04/21/2013   Permanent atrial fibrillation (HCC) - . Left atrial appendage is clipped. 04/21/2013   GERD (gastroesophageal reflux disease) 04/21/2013   Past Medical History:  Diagnosis Date   Acute CHF (HCC) 05/06/2013   Acute respiratory failure with hypoxia (HCC) 04/24/2023   Aortic stenosis    a. s/p tissue AVR 05/2013;  b. Echo (06/2013):  Mod LVH, EF 60-65%, no RWMA, Gr 2 DD, AVR ok (mean 13 mmHg), MAC, mild MR, mod LAE, mild RAE, PASP 46 mmHg (mild pulmo HTN)   Atrial fibrillation with RVR (HCC) 05/06/2013   Blood loss anemia    a. 05/2009 a/w GIB.   CAD (coronary artery disease)    a. Cath 04/2009: BMS to LCx 04/2009; CTO of RCA with L-R collaterals, 40% ostial diag.   Carpal tunnel syndrome    bilateral, carpal tunnel release x 2   Esophageal ulcer    a. 05/2009 with hemorrhage - injected with epinephrine -Dr. Lamar Bunk.   Esophagitis    a. 05/2009 a/w GIB.   GERD (gastroesophageal reflux disease)    EGD, Dr. Quay 2008   GI bleed    a. 05/2009 as above: with associated anemia. Lower esophageal ulcer with extensive erosive esophagitis and large clot by EGD 05/2009.    Hx of colonoscopy 2008   Dr. Cindy, polyps- 5 yr f/u recommended   Hyperlipidemia    Hypertension    Influenza A 04/24/2023   PAF (paroxysmal  atrial fibrillation) (HCC)    a. Post-op AVR;  b. 04/2014 recurrent.   Perforated ear drum    right ear drum, tube in left ear drum, Dr. Jesus   Peripheral vascular disease    Polymyalgia rheumatica    Prostate cancer [facility])    Vitamin D  deficiency  Family History  Problem Relation Age of Onset   CVA Mother    Kidney disease Father    Breast cancer Daughter     Past Surgical History:  Procedure Laterality Date   AORTIC VALVE REPLACEMENT N/A 05/12/2013   Procedure: AORTIC VALVE REPLACEMENT (AVR);  Surgeon: Maude Fleeta Ochoa, MD;  Location: [facility] OR;  Service: Open Heart Surgery;  Laterality: N/A;   CARDIAC CATHETERIZATION  2011   CARDIOVERSION N/A 04/12/2020   Procedure: CARDIOVERSION;  Surgeon: Maranda Leim DEL, MD;  Location: [facility] ENDOSCOPY;  Service: Cardiovascular;  Laterality: N/A;   CORONARY ARTERY BYPASS GRAFT N/A 05/12/2013   Procedure: CORONARY ARTERY BYPASS GRAFTING (CABG);  Surgeon: Maude Fleeta Ochoa, MD;  Location: [facility] OR;  Service: Open Heart Surgery;  Laterality: N/A;  CABG x 1 using left leg greater saphenous vein harvested endoscopically   INTRAOPERATIVE TRANSESOPHAGEAL ECHOCARDIOGRAM N/A 05/12/2013   Procedure: INTRAOPERATIVE TRANSESOPHAGEAL ECHOCARDIOGRAM;  Surgeon: Maude Fleeta Ochoa, MD;  Location: [facility] OR;  Service: Open Heart Surgery;  Laterality: N/A;   LEFT AND RIGHT HEART CATHETERIZATION WITH CORONARY ANGIOGRAM N/A 05/09/2013   Procedure: LEFT AND RIGHT HEART CATHETERIZATION WITH CORONARY ANGIOGRAM;  Surgeon: Dorn JINNY Lesches, MD;  Location: [facility] CATH LAB;  Service: Cardiovascular;  Laterality: N/A;   MAZE N/A 05/12/2013   Procedure: MAZE;  Surgeon: Maude Fleeta Ochoa, MD;  Location: [facility] OR;  Service: Open Heart Surgery;  Laterality: N/A;   PACEMAKER IMPLANT N/A 05/21/2020   Procedure: PACEMAKER IMPLANT;  Surgeon: Waddell Danelle ORN, MD;  Location: [facility] INVASIVE CV LAB;  Service: Cardiovascular;  Laterality: N/A;   PROSTATECTOMY     TEE WITHOUT CARDIOVERSION N/A 04/12/2020   Procedure:  TRANSESOPHAGEAL ECHOCARDIOGRAM (TEE);  Surgeon: Maranda Leim DEL, MD;  Location: [facility] ENDOSCOPY;  Service: Cardiovascular;  Laterality: N/A;   Social History   Occupational History   Not on file  Tobacco Use   Smoking status: Never   Smokeless tobacco: Never  Vaping Use   Vaping status: Never Used  Substance and Sexual Activity   Alcohol use: No   Drug use: No   Sexual activity: Not on file       "

## 2024-04-14 ENCOUNTER — Ambulatory Visit: Admitting: Physician Assistant
